# Patient Record
Sex: Male | Born: 1935 | Race: White | Hispanic: No | Marital: Married | Smoking: Former smoker
Health system: Southern US, Community
[De-identification: ages and names within clinical notes are randomized; demographics above are authoritative.]

## PROBLEM LIST (undated history)

## (undated) ENCOUNTER — Inpatient Hospital Stay: Payer: Self-pay

## (undated) DIAGNOSIS — J45909 Unspecified asthma, uncomplicated: Secondary | ICD-10-CM

## (undated) DIAGNOSIS — F419 Anxiety disorder, unspecified: Secondary | ICD-10-CM

## (undated) DIAGNOSIS — J302 Other seasonal allergic rhinitis: Secondary | ICD-10-CM

## (undated) DIAGNOSIS — I10 Essential (primary) hypertension: Secondary | ICD-10-CM

## (undated) DIAGNOSIS — C61 Malignant neoplasm of prostate: Secondary | ICD-10-CM

## (undated) DIAGNOSIS — I4892 Unspecified atrial flutter: Secondary | ICD-10-CM

## (undated) DIAGNOSIS — A7740 Ehrlichiosis, unspecified: Secondary | ICD-10-CM

## (undated) DIAGNOSIS — K219 Gastro-esophageal reflux disease without esophagitis: Secondary | ICD-10-CM

## (undated) DIAGNOSIS — I480 Paroxysmal atrial fibrillation: Secondary | ICD-10-CM

## (undated) DIAGNOSIS — M3130 Wegener's granulomatosis without renal involvement: Secondary | ICD-10-CM

## (undated) HISTORY — DX: Hyperlipidemia, unspecified: E78.5

## (undated) HISTORY — DX: Other seasonal allergic rhinitis: J30.2

## (undated) HISTORY — DX: Unspecified asthma, uncomplicated: J45.909

## (undated) HISTORY — DX: Unspecified atrial flutter: I48.92

## (undated) HISTORY — PX: HERNIA REPAIR: SHX51

## (undated) HISTORY — DX: Paroxysmal atrial fibrillation: I48.0

## (undated) HISTORY — DX: Ehrlichiosis, unspecified: A77.40

## (undated) HISTORY — DX: Essential (primary) hypertension: I10

## (undated) HISTORY — DX: Wegener's granulomatosis without renal involvement: M31.30

## (undated) HISTORY — DX: Gastro-esophageal reflux disease without esophagitis: K21.9

## (undated) HISTORY — DX: Anxiety disorder, unspecified: F41.9

## (undated) HISTORY — DX: Malignant neoplasm of prostate: C61

## (undated) SURGERY — Surgical Case
Anesthesia: *Unknown

---
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
## 1993-02-28 HISTORY — PX: OTHER SURGICAL HISTORY: SHX169

## 1998-02-28 HISTORY — PX: KNEE ARTHROSCOPY: SUR90

## 2006-02-28 DIAGNOSIS — A7740 Ehrlichiosis, unspecified: Secondary | ICD-10-CM

## 2006-02-28 HISTORY — DX: Ehrlichiosis, unspecified: A77.40

## 2011-03-01 HISTORY — PX: BASAL CELL CARCINOMA EXCISION: SHX1214

## 2015-03-03 DIAGNOSIS — J3089 Other allergic rhinitis: Secondary | ICD-10-CM | POA: Diagnosis not present

## 2015-03-03 DIAGNOSIS — J301 Allergic rhinitis due to pollen: Secondary | ICD-10-CM | POA: Diagnosis not present

## 2015-03-24 DIAGNOSIS — R05 Cough: Secondary | ICD-10-CM | POA: Diagnosis not present

## 2015-03-24 DIAGNOSIS — R0602 Shortness of breath: Secondary | ICD-10-CM | POA: Diagnosis not present

## 2015-03-24 DIAGNOSIS — J4541 Moderate persistent asthma with (acute) exacerbation: Secondary | ICD-10-CM | POA: Diagnosis not present

## 2015-03-31 DIAGNOSIS — J3089 Other allergic rhinitis: Secondary | ICD-10-CM | POA: Diagnosis not present

## 2015-03-31 DIAGNOSIS — J45998 Other asthma: Secondary | ICD-10-CM | POA: Diagnosis not present

## 2015-03-31 DIAGNOSIS — J301 Allergic rhinitis due to pollen: Secondary | ICD-10-CM | POA: Diagnosis not present

## 2015-04-06 DIAGNOSIS — J3089 Other allergic rhinitis: Secondary | ICD-10-CM | POA: Diagnosis not present

## 2015-04-06 DIAGNOSIS — J4540 Moderate persistent asthma, uncomplicated: Secondary | ICD-10-CM | POA: Diagnosis not present

## 2015-04-06 DIAGNOSIS — R768 Other specified abnormal immunological findings in serum: Secondary | ICD-10-CM | POA: Diagnosis not present

## 2015-04-07 DIAGNOSIS — J301 Allergic rhinitis due to pollen: Secondary | ICD-10-CM | POA: Diagnosis not present

## 2015-04-07 DIAGNOSIS — J3089 Other allergic rhinitis: Secondary | ICD-10-CM | POA: Diagnosis not present

## 2015-04-16 DIAGNOSIS — J4540 Moderate persistent asthma, uncomplicated: Secondary | ICD-10-CM | POA: Diagnosis not present

## 2015-04-16 DIAGNOSIS — J4550 Severe persistent asthma, uncomplicated: Secondary | ICD-10-CM | POA: Diagnosis not present

## 2015-04-22 DIAGNOSIS — L570 Actinic keratosis: Secondary | ICD-10-CM | POA: Diagnosis not present

## 2015-04-22 DIAGNOSIS — I781 Nevus, non-neoplastic: Secondary | ICD-10-CM | POA: Diagnosis not present

## 2015-04-22 DIAGNOSIS — D2339 Other benign neoplasm of skin of other parts of face: Secondary | ICD-10-CM | POA: Diagnosis not present

## 2015-04-22 DIAGNOSIS — D485 Neoplasm of uncertain behavior of skin: Secondary | ICD-10-CM | POA: Diagnosis not present

## 2015-04-24 DIAGNOSIS — C4491 Basal cell carcinoma of skin, unspecified: Secondary | ICD-10-CM | POA: Diagnosis not present

## 2015-04-24 DIAGNOSIS — C44319 Basal cell carcinoma of skin of other parts of face: Secondary | ICD-10-CM | POA: Diagnosis not present

## 2015-04-24 DIAGNOSIS — D239 Other benign neoplasm of skin, unspecified: Secondary | ICD-10-CM | POA: Diagnosis not present

## 2015-04-24 DIAGNOSIS — D485 Neoplasm of uncertain behavior of skin: Secondary | ICD-10-CM | POA: Diagnosis not present

## 2015-04-30 DIAGNOSIS — C61 Malignant neoplasm of prostate: Secondary | ICD-10-CM | POA: Diagnosis not present

## 2015-04-30 DIAGNOSIS — J301 Allergic rhinitis due to pollen: Secondary | ICD-10-CM | POA: Diagnosis not present

## 2015-04-30 DIAGNOSIS — J3089 Other allergic rhinitis: Secondary | ICD-10-CM | POA: Diagnosis not present

## 2015-05-07 DIAGNOSIS — C61 Malignant neoplasm of prostate: Secondary | ICD-10-CM | POA: Diagnosis not present

## 2015-05-21 DIAGNOSIS — J301 Allergic rhinitis due to pollen: Secondary | ICD-10-CM | POA: Diagnosis not present

## 2015-05-21 DIAGNOSIS — J3089 Other allergic rhinitis: Secondary | ICD-10-CM | POA: Diagnosis not present

## 2015-06-11 DIAGNOSIS — J3089 Other allergic rhinitis: Secondary | ICD-10-CM | POA: Diagnosis not present

## 2015-06-11 DIAGNOSIS — J301 Allergic rhinitis due to pollen: Secondary | ICD-10-CM | POA: Diagnosis not present

## 2015-06-22 DIAGNOSIS — J4540 Moderate persistent asthma, uncomplicated: Secondary | ICD-10-CM | POA: Diagnosis not present

## 2015-06-22 DIAGNOSIS — J3089 Other allergic rhinitis: Secondary | ICD-10-CM | POA: Diagnosis not present

## 2015-06-22 DIAGNOSIS — R768 Other specified abnormal immunological findings in serum: Secondary | ICD-10-CM | POA: Diagnosis not present

## 2015-06-29 DIAGNOSIS — J4530 Mild persistent asthma, uncomplicated: Secondary | ICD-10-CM | POA: Diagnosis not present

## 2015-06-29 DIAGNOSIS — K210 Gastro-esophageal reflux disease with esophagitis: Secondary | ICD-10-CM | POA: Diagnosis not present

## 2015-06-29 DIAGNOSIS — J302 Other seasonal allergic rhinitis: Secondary | ICD-10-CM | POA: Diagnosis not present

## 2015-07-07 DIAGNOSIS — J3089 Other allergic rhinitis: Secondary | ICD-10-CM | POA: Diagnosis not present

## 2015-07-07 DIAGNOSIS — J301 Allergic rhinitis due to pollen: Secondary | ICD-10-CM | POA: Diagnosis not present

## 2015-07-09 DIAGNOSIS — D472 Monoclonal gammopathy: Secondary | ICD-10-CM | POA: Diagnosis not present

## 2015-07-16 DIAGNOSIS — D472 Monoclonal gammopathy: Secondary | ICD-10-CM | POA: Diagnosis not present

## 2015-07-16 DIAGNOSIS — J3089 Other allergic rhinitis: Secondary | ICD-10-CM | POA: Diagnosis not present

## 2015-07-16 DIAGNOSIS — J329 Chronic sinusitis, unspecified: Secondary | ICD-10-CM | POA: Diagnosis not present

## 2015-07-16 DIAGNOSIS — J301 Allergic rhinitis due to pollen: Secondary | ICD-10-CM | POA: Diagnosis not present

## 2015-07-16 DIAGNOSIS — J45998 Other asthma: Secondary | ICD-10-CM | POA: Diagnosis not present

## 2015-07-28 DIAGNOSIS — J3089 Other allergic rhinitis: Secondary | ICD-10-CM | POA: Diagnosis not present

## 2015-07-28 DIAGNOSIS — J301 Allergic rhinitis due to pollen: Secondary | ICD-10-CM | POA: Diagnosis not present

## 2015-08-03 DIAGNOSIS — J4540 Moderate persistent asthma, uncomplicated: Secondary | ICD-10-CM | POA: Diagnosis not present

## 2015-08-18 DIAGNOSIS — J301 Allergic rhinitis due to pollen: Secondary | ICD-10-CM | POA: Diagnosis not present

## 2015-08-18 DIAGNOSIS — J3089 Other allergic rhinitis: Secondary | ICD-10-CM | POA: Diagnosis not present

## 2015-08-24 DIAGNOSIS — J4540 Moderate persistent asthma, uncomplicated: Secondary | ICD-10-CM | POA: Diagnosis not present

## 2015-08-24 DIAGNOSIS — R768 Other specified abnormal immunological findings in serum: Secondary | ICD-10-CM | POA: Diagnosis not present

## 2015-08-24 DIAGNOSIS — J3089 Other allergic rhinitis: Secondary | ICD-10-CM | POA: Diagnosis not present

## 2015-09-08 DIAGNOSIS — J3089 Other allergic rhinitis: Secondary | ICD-10-CM | POA: Diagnosis not present

## 2015-09-08 DIAGNOSIS — J301 Allergic rhinitis due to pollen: Secondary | ICD-10-CM | POA: Diagnosis not present

## 2015-09-14 DIAGNOSIS — L723 Sebaceous cyst: Secondary | ICD-10-CM | POA: Diagnosis not present

## 2015-09-14 DIAGNOSIS — L570 Actinic keratosis: Secondary | ICD-10-CM | POA: Diagnosis not present

## 2015-09-14 DIAGNOSIS — I781 Nevus, non-neoplastic: Secondary | ICD-10-CM | POA: Diagnosis not present

## 2015-09-14 DIAGNOSIS — D2339 Other benign neoplasm of skin of other parts of face: Secondary | ICD-10-CM | POA: Diagnosis not present

## 2015-10-06 DIAGNOSIS — J3089 Other allergic rhinitis: Secondary | ICD-10-CM | POA: Diagnosis not present

## 2015-10-06 DIAGNOSIS — J301 Allergic rhinitis due to pollen: Secondary | ICD-10-CM | POA: Diagnosis not present

## 2015-10-28 DIAGNOSIS — L905 Scar conditions and fibrosis of skin: Secondary | ICD-10-CM | POA: Diagnosis not present

## 2015-10-28 DIAGNOSIS — I781 Nevus, non-neoplastic: Secondary | ICD-10-CM | POA: Diagnosis not present

## 2015-10-28 DIAGNOSIS — L821 Other seborrheic keratosis: Secondary | ICD-10-CM | POA: Diagnosis not present

## 2015-10-28 DIAGNOSIS — L570 Actinic keratosis: Secondary | ICD-10-CM | POA: Diagnosis not present

## 2015-10-29 DIAGNOSIS — J301 Allergic rhinitis due to pollen: Secondary | ICD-10-CM | POA: Diagnosis not present

## 2015-10-29 DIAGNOSIS — J3089 Other allergic rhinitis: Secondary | ICD-10-CM | POA: Diagnosis not present

## 2015-11-24 DIAGNOSIS — J301 Allergic rhinitis due to pollen: Secondary | ICD-10-CM | POA: Diagnosis not present

## 2015-11-24 DIAGNOSIS — J3089 Other allergic rhinitis: Secondary | ICD-10-CM | POA: Diagnosis not present

## 2015-11-25 DIAGNOSIS — J3089 Other allergic rhinitis: Secondary | ICD-10-CM | POA: Diagnosis not present

## 2015-11-25 DIAGNOSIS — J4540 Moderate persistent asthma, uncomplicated: Secondary | ICD-10-CM | POA: Diagnosis not present

## 2015-11-25 DIAGNOSIS — B4481 Allergic bronchopulmonary aspergillosis: Secondary | ICD-10-CM | POA: Diagnosis not present

## 2015-12-15 DIAGNOSIS — J301 Allergic rhinitis due to pollen: Secondary | ICD-10-CM | POA: Diagnosis not present

## 2015-12-15 DIAGNOSIS — J3089 Other allergic rhinitis: Secondary | ICD-10-CM | POA: Diagnosis not present

## 2015-12-17 DIAGNOSIS — Z09 Encounter for follow-up examination after completed treatment for conditions other than malignant neoplasm: Secondary | ICD-10-CM | POA: Diagnosis not present

## 2015-12-17 DIAGNOSIS — B4481 Allergic bronchopulmonary aspergillosis: Secondary | ICD-10-CM | POA: Diagnosis not present

## 2015-12-21 DIAGNOSIS — H35373 Puckering of macula, bilateral: Secondary | ICD-10-CM | POA: Diagnosis not present

## 2015-12-21 DIAGNOSIS — H43813 Vitreous degeneration, bilateral: Secondary | ICD-10-CM | POA: Diagnosis not present

## 2015-12-21 DIAGNOSIS — H2513 Age-related nuclear cataract, bilateral: Secondary | ICD-10-CM | POA: Diagnosis not present

## 2015-12-25 DIAGNOSIS — D485 Neoplasm of uncertain behavior of skin: Secondary | ICD-10-CM | POA: Diagnosis not present

## 2015-12-25 DIAGNOSIS — L905 Scar conditions and fibrosis of skin: Secondary | ICD-10-CM | POA: Diagnosis not present

## 2015-12-28 DIAGNOSIS — Z833 Family history of diabetes mellitus: Secondary | ICD-10-CM | POA: Diagnosis not present

## 2015-12-28 DIAGNOSIS — M7551 Bursitis of right shoulder: Secondary | ICD-10-CM | POA: Diagnosis not present

## 2015-12-28 DIAGNOSIS — Z0000 Encounter for general adult medical examination without abnormal findings: Secondary | ICD-10-CM | POA: Diagnosis not present

## 2015-12-28 DIAGNOSIS — J4540 Moderate persistent asthma, uncomplicated: Secondary | ICD-10-CM | POA: Diagnosis not present

## 2015-12-28 DIAGNOSIS — Z92241 Personal history of systemic steroid therapy: Secondary | ICD-10-CM | POA: Diagnosis not present

## 2015-12-28 DIAGNOSIS — Z23 Encounter for immunization: Secondary | ICD-10-CM | POA: Diagnosis not present

## 2016-01-05 DIAGNOSIS — J3089 Other allergic rhinitis: Secondary | ICD-10-CM | POA: Diagnosis not present

## 2016-01-05 DIAGNOSIS — J301 Allergic rhinitis due to pollen: Secondary | ICD-10-CM | POA: Diagnosis not present

## 2016-01-28 DIAGNOSIS — J3089 Other allergic rhinitis: Secondary | ICD-10-CM | POA: Diagnosis not present

## 2016-01-28 DIAGNOSIS — J301 Allergic rhinitis due to pollen: Secondary | ICD-10-CM | POA: Diagnosis not present

## 2016-02-01 DIAGNOSIS — C44319 Basal cell carcinoma of skin of other parts of face: Secondary | ICD-10-CM | POA: Diagnosis not present

## 2016-02-01 DIAGNOSIS — Z85828 Personal history of other malignant neoplasm of skin: Secondary | ICD-10-CM | POA: Diagnosis not present

## 2016-02-01 DIAGNOSIS — D485 Neoplasm of uncertain behavior of skin: Secondary | ICD-10-CM | POA: Diagnosis not present

## 2016-02-02 DIAGNOSIS — Z92241 Personal history of systemic steroid therapy: Secondary | ICD-10-CM | POA: Diagnosis not present

## 2016-02-02 DIAGNOSIS — Z833 Family history of diabetes mellitus: Secondary | ICD-10-CM | POA: Diagnosis not present

## 2016-02-02 DIAGNOSIS — Z0000 Encounter for general adult medical examination without abnormal findings: Secondary | ICD-10-CM | POA: Diagnosis not present

## 2016-02-10 DIAGNOSIS — M7061 Trochanteric bursitis, right hip: Secondary | ICD-10-CM | POA: Diagnosis not present

## 2016-02-11 DIAGNOSIS — Z85828 Personal history of other malignant neoplasm of skin: Secondary | ICD-10-CM | POA: Diagnosis not present

## 2016-02-11 DIAGNOSIS — L905 Scar conditions and fibrosis of skin: Secondary | ICD-10-CM | POA: Diagnosis not present

## 2016-02-11 DIAGNOSIS — L570 Actinic keratosis: Secondary | ICD-10-CM | POA: Diagnosis not present

## 2016-02-15 DIAGNOSIS — B4481 Allergic bronchopulmonary aspergillosis: Secondary | ICD-10-CM | POA: Diagnosis not present

## 2016-02-15 DIAGNOSIS — J4540 Moderate persistent asthma, uncomplicated: Secondary | ICD-10-CM | POA: Diagnosis not present

## 2016-02-18 DIAGNOSIS — J301 Allergic rhinitis due to pollen: Secondary | ICD-10-CM | POA: Diagnosis not present

## 2016-02-18 DIAGNOSIS — J3089 Other allergic rhinitis: Secondary | ICD-10-CM | POA: Diagnosis not present

## 2016-03-11 DIAGNOSIS — M25511 Pain in right shoulder: Secondary | ICD-10-CM | POA: Diagnosis not present

## 2016-03-15 DIAGNOSIS — J301 Allergic rhinitis due to pollen: Secondary | ICD-10-CM | POA: Diagnosis not present

## 2016-03-15 DIAGNOSIS — J3089 Other allergic rhinitis: Secondary | ICD-10-CM | POA: Diagnosis not present

## 2016-03-16 DIAGNOSIS — M25511 Pain in right shoulder: Secondary | ICD-10-CM | POA: Diagnosis not present

## 2016-03-16 DIAGNOSIS — M7541 Impingement syndrome of right shoulder: Secondary | ICD-10-CM | POA: Diagnosis not present

## 2016-03-16 DIAGNOSIS — M19011 Primary osteoarthritis, right shoulder: Secondary | ICD-10-CM | POA: Diagnosis not present

## 2016-03-18 DIAGNOSIS — M7541 Impingement syndrome of right shoulder: Secondary | ICD-10-CM | POA: Diagnosis not present

## 2016-03-18 DIAGNOSIS — M25511 Pain in right shoulder: Secondary | ICD-10-CM | POA: Diagnosis not present

## 2016-03-18 DIAGNOSIS — M19011 Primary osteoarthritis, right shoulder: Secondary | ICD-10-CM | POA: Diagnosis not present

## 2016-03-21 DIAGNOSIS — M25511 Pain in right shoulder: Secondary | ICD-10-CM | POA: Diagnosis not present

## 2016-03-21 DIAGNOSIS — M19011 Primary osteoarthritis, right shoulder: Secondary | ICD-10-CM | POA: Diagnosis not present

## 2016-03-21 DIAGNOSIS — M7541 Impingement syndrome of right shoulder: Secondary | ICD-10-CM | POA: Diagnosis not present

## 2016-03-23 DIAGNOSIS — M19011 Primary osteoarthritis, right shoulder: Secondary | ICD-10-CM | POA: Diagnosis not present

## 2016-03-23 DIAGNOSIS — M25511 Pain in right shoulder: Secondary | ICD-10-CM | POA: Diagnosis not present

## 2016-03-23 DIAGNOSIS — M7541 Impingement syndrome of right shoulder: Secondary | ICD-10-CM | POA: Diagnosis not present

## 2016-03-25 DIAGNOSIS — M19011 Primary osteoarthritis, right shoulder: Secondary | ICD-10-CM | POA: Diagnosis not present

## 2016-03-25 DIAGNOSIS — M25511 Pain in right shoulder: Secondary | ICD-10-CM | POA: Diagnosis not present

## 2016-03-25 DIAGNOSIS — M7541 Impingement syndrome of right shoulder: Secondary | ICD-10-CM | POA: Diagnosis not present

## 2016-03-30 DIAGNOSIS — M25511 Pain in right shoulder: Secondary | ICD-10-CM | POA: Diagnosis not present

## 2016-03-30 DIAGNOSIS — M7541 Impingement syndrome of right shoulder: Secondary | ICD-10-CM | POA: Diagnosis not present

## 2016-03-30 DIAGNOSIS — M19011 Primary osteoarthritis, right shoulder: Secondary | ICD-10-CM | POA: Diagnosis not present

## 2016-04-01 DIAGNOSIS — M7541 Impingement syndrome of right shoulder: Secondary | ICD-10-CM | POA: Diagnosis not present

## 2016-04-01 DIAGNOSIS — M25511 Pain in right shoulder: Secondary | ICD-10-CM | POA: Diagnosis not present

## 2016-04-01 DIAGNOSIS — M19011 Primary osteoarthritis, right shoulder: Secondary | ICD-10-CM | POA: Diagnosis not present

## 2016-04-04 DIAGNOSIS — M19011 Primary osteoarthritis, right shoulder: Secondary | ICD-10-CM | POA: Diagnosis not present

## 2016-04-04 DIAGNOSIS — M7541 Impingement syndrome of right shoulder: Secondary | ICD-10-CM | POA: Diagnosis not present

## 2016-04-04 DIAGNOSIS — M25511 Pain in right shoulder: Secondary | ICD-10-CM | POA: Diagnosis not present

## 2016-04-05 DIAGNOSIS — J3089 Other allergic rhinitis: Secondary | ICD-10-CM | POA: Diagnosis not present

## 2016-04-05 DIAGNOSIS — J301 Allergic rhinitis due to pollen: Secondary | ICD-10-CM | POA: Diagnosis not present

## 2016-04-06 DIAGNOSIS — M7541 Impingement syndrome of right shoulder: Secondary | ICD-10-CM | POA: Diagnosis not present

## 2016-04-06 DIAGNOSIS — M19011 Primary osteoarthritis, right shoulder: Secondary | ICD-10-CM | POA: Diagnosis not present

## 2016-04-06 DIAGNOSIS — M25511 Pain in right shoulder: Secondary | ICD-10-CM | POA: Diagnosis not present

## 2016-04-08 DIAGNOSIS — M19011 Primary osteoarthritis, right shoulder: Secondary | ICD-10-CM | POA: Diagnosis not present

## 2016-04-08 DIAGNOSIS — M25511 Pain in right shoulder: Secondary | ICD-10-CM | POA: Diagnosis not present

## 2016-04-08 DIAGNOSIS — M7541 Impingement syndrome of right shoulder: Secondary | ICD-10-CM | POA: Diagnosis not present

## 2016-04-11 DIAGNOSIS — M25511 Pain in right shoulder: Secondary | ICD-10-CM | POA: Diagnosis not present

## 2016-04-11 DIAGNOSIS — M7541 Impingement syndrome of right shoulder: Secondary | ICD-10-CM | POA: Diagnosis not present

## 2016-04-11 DIAGNOSIS — M19011 Primary osteoarthritis, right shoulder: Secondary | ICD-10-CM | POA: Diagnosis not present

## 2016-04-13 DIAGNOSIS — M7541 Impingement syndrome of right shoulder: Secondary | ICD-10-CM | POA: Diagnosis not present

## 2016-04-13 DIAGNOSIS — M25511 Pain in right shoulder: Secondary | ICD-10-CM | POA: Diagnosis not present

## 2016-04-13 DIAGNOSIS — M19011 Primary osteoarthritis, right shoulder: Secondary | ICD-10-CM | POA: Diagnosis not present

## 2016-04-20 DIAGNOSIS — M25511 Pain in right shoulder: Secondary | ICD-10-CM | POA: Diagnosis not present

## 2016-04-20 DIAGNOSIS — M19011 Primary osteoarthritis, right shoulder: Secondary | ICD-10-CM | POA: Diagnosis not present

## 2016-04-20 DIAGNOSIS — M7541 Impingement syndrome of right shoulder: Secondary | ICD-10-CM | POA: Diagnosis not present

## 2016-04-22 DIAGNOSIS — M25511 Pain in right shoulder: Secondary | ICD-10-CM | POA: Diagnosis not present

## 2016-04-22 DIAGNOSIS — M19011 Primary osteoarthritis, right shoulder: Secondary | ICD-10-CM | POA: Diagnosis not present

## 2016-04-22 DIAGNOSIS — M7541 Impingement syndrome of right shoulder: Secondary | ICD-10-CM | POA: Diagnosis not present

## 2016-04-25 DIAGNOSIS — M7541 Impingement syndrome of right shoulder: Secondary | ICD-10-CM | POA: Diagnosis not present

## 2016-04-25 DIAGNOSIS — M25511 Pain in right shoulder: Secondary | ICD-10-CM | POA: Diagnosis not present

## 2016-04-25 DIAGNOSIS — M19011 Primary osteoarthritis, right shoulder: Secondary | ICD-10-CM | POA: Diagnosis not present

## 2016-04-27 DIAGNOSIS — M7541 Impingement syndrome of right shoulder: Secondary | ICD-10-CM | POA: Diagnosis not present

## 2016-04-27 DIAGNOSIS — M25511 Pain in right shoulder: Secondary | ICD-10-CM | POA: Diagnosis not present

## 2016-04-27 DIAGNOSIS — M19011 Primary osteoarthritis, right shoulder: Secondary | ICD-10-CM | POA: Diagnosis not present

## 2016-04-28 DIAGNOSIS — J301 Allergic rhinitis due to pollen: Secondary | ICD-10-CM | POA: Diagnosis not present

## 2016-04-28 DIAGNOSIS — J3089 Other allergic rhinitis: Secondary | ICD-10-CM | POA: Diagnosis not present

## 2016-04-28 DIAGNOSIS — C61 Malignant neoplasm of prostate: Secondary | ICD-10-CM | POA: Diagnosis not present

## 2016-04-29 DIAGNOSIS — M7541 Impingement syndrome of right shoulder: Secondary | ICD-10-CM | POA: Diagnosis not present

## 2016-04-29 DIAGNOSIS — M25511 Pain in right shoulder: Secondary | ICD-10-CM | POA: Diagnosis not present

## 2016-04-29 DIAGNOSIS — M19011 Primary osteoarthritis, right shoulder: Secondary | ICD-10-CM | POA: Diagnosis not present

## 2016-05-02 DIAGNOSIS — M19011 Primary osteoarthritis, right shoulder: Secondary | ICD-10-CM | POA: Diagnosis not present

## 2016-05-02 DIAGNOSIS — M7541 Impingement syndrome of right shoulder: Secondary | ICD-10-CM | POA: Diagnosis not present

## 2016-05-02 DIAGNOSIS — M25511 Pain in right shoulder: Secondary | ICD-10-CM | POA: Diagnosis not present

## 2016-05-04 DIAGNOSIS — M7541 Impingement syndrome of right shoulder: Secondary | ICD-10-CM | POA: Diagnosis not present

## 2016-05-04 DIAGNOSIS — M19011 Primary osteoarthritis, right shoulder: Secondary | ICD-10-CM | POA: Diagnosis not present

## 2016-05-04 DIAGNOSIS — M25511 Pain in right shoulder: Secondary | ICD-10-CM | POA: Diagnosis not present

## 2016-05-05 DIAGNOSIS — C61 Malignant neoplasm of prostate: Secondary | ICD-10-CM | POA: Diagnosis not present

## 2016-05-09 DIAGNOSIS — M19011 Primary osteoarthritis, right shoulder: Secondary | ICD-10-CM | POA: Diagnosis not present

## 2016-05-09 DIAGNOSIS — M7541 Impingement syndrome of right shoulder: Secondary | ICD-10-CM | POA: Diagnosis not present

## 2016-05-09 DIAGNOSIS — M25511 Pain in right shoulder: Secondary | ICD-10-CM | POA: Diagnosis not present

## 2016-05-11 DIAGNOSIS — M19011 Primary osteoarthritis, right shoulder: Secondary | ICD-10-CM | POA: Diagnosis not present

## 2016-05-11 DIAGNOSIS — M7541 Impingement syndrome of right shoulder: Secondary | ICD-10-CM | POA: Diagnosis not present

## 2016-05-11 DIAGNOSIS — M25511 Pain in right shoulder: Secondary | ICD-10-CM | POA: Diagnosis not present

## 2016-05-17 DIAGNOSIS — J4540 Moderate persistent asthma, uncomplicated: Secondary | ICD-10-CM | POA: Diagnosis not present

## 2016-05-17 DIAGNOSIS — J302 Other seasonal allergic rhinitis: Secondary | ICD-10-CM | POA: Diagnosis not present

## 2016-05-17 DIAGNOSIS — M7541 Impingement syndrome of right shoulder: Secondary | ICD-10-CM | POA: Diagnosis not present

## 2016-05-17 DIAGNOSIS — M19011 Primary osteoarthritis, right shoulder: Secondary | ICD-10-CM | POA: Diagnosis not present

## 2016-05-17 DIAGNOSIS — M25511 Pain in right shoulder: Secondary | ICD-10-CM | POA: Diagnosis not present

## 2016-05-17 DIAGNOSIS — B4481 Allergic bronchopulmonary aspergillosis: Secondary | ICD-10-CM | POA: Diagnosis not present

## 2016-05-19 DIAGNOSIS — L905 Scar conditions and fibrosis of skin: Secondary | ICD-10-CM | POA: Diagnosis not present

## 2016-05-19 DIAGNOSIS — L570 Actinic keratosis: Secondary | ICD-10-CM | POA: Diagnosis not present

## 2016-05-19 DIAGNOSIS — Z85828 Personal history of other malignant neoplasm of skin: Secondary | ICD-10-CM | POA: Diagnosis not present

## 2016-05-19 DIAGNOSIS — D485 Neoplasm of uncertain behavior of skin: Secondary | ICD-10-CM | POA: Diagnosis not present

## 2016-05-20 DIAGNOSIS — M19011 Primary osteoarthritis, right shoulder: Secondary | ICD-10-CM | POA: Diagnosis not present

## 2016-05-20 DIAGNOSIS — M25511 Pain in right shoulder: Secondary | ICD-10-CM | POA: Diagnosis not present

## 2016-05-20 DIAGNOSIS — M7541 Impingement syndrome of right shoulder: Secondary | ICD-10-CM | POA: Diagnosis not present

## 2016-05-23 DIAGNOSIS — M25511 Pain in right shoulder: Secondary | ICD-10-CM | POA: Diagnosis not present

## 2016-05-23 DIAGNOSIS — M7541 Impingement syndrome of right shoulder: Secondary | ICD-10-CM | POA: Diagnosis not present

## 2016-05-23 DIAGNOSIS — M19011 Primary osteoarthritis, right shoulder: Secondary | ICD-10-CM | POA: Diagnosis not present

## 2016-05-24 DIAGNOSIS — E039 Hypothyroidism, unspecified: Secondary | ICD-10-CM | POA: Diagnosis not present

## 2016-05-24 DIAGNOSIS — J301 Allergic rhinitis due to pollen: Secondary | ICD-10-CM | POA: Diagnosis not present

## 2016-05-24 DIAGNOSIS — J3089 Other allergic rhinitis: Secondary | ICD-10-CM | POA: Diagnosis not present

## 2016-05-25 DIAGNOSIS — M7541 Impingement syndrome of right shoulder: Secondary | ICD-10-CM | POA: Diagnosis not present

## 2016-05-25 DIAGNOSIS — M19011 Primary osteoarthritis, right shoulder: Secondary | ICD-10-CM | POA: Diagnosis not present

## 2016-05-25 DIAGNOSIS — M25511 Pain in right shoulder: Secondary | ICD-10-CM | POA: Diagnosis not present

## 2016-05-27 DIAGNOSIS — M7541 Impingement syndrome of right shoulder: Secondary | ICD-10-CM | POA: Diagnosis not present

## 2016-05-27 DIAGNOSIS — M19011 Primary osteoarthritis, right shoulder: Secondary | ICD-10-CM | POA: Diagnosis not present

## 2016-05-27 DIAGNOSIS — M25511 Pain in right shoulder: Secondary | ICD-10-CM | POA: Diagnosis not present

## 2016-05-30 DIAGNOSIS — M19011 Primary osteoarthritis, right shoulder: Secondary | ICD-10-CM | POA: Diagnosis not present

## 2016-05-30 DIAGNOSIS — M25511 Pain in right shoulder: Secondary | ICD-10-CM | POA: Diagnosis not present

## 2016-05-30 DIAGNOSIS — M7541 Impingement syndrome of right shoulder: Secondary | ICD-10-CM | POA: Diagnosis not present

## 2016-06-01 DIAGNOSIS — M25511 Pain in right shoulder: Secondary | ICD-10-CM | POA: Diagnosis not present

## 2016-06-01 DIAGNOSIS — M19011 Primary osteoarthritis, right shoulder: Secondary | ICD-10-CM | POA: Diagnosis not present

## 2016-06-01 DIAGNOSIS — M7541 Impingement syndrome of right shoulder: Secondary | ICD-10-CM | POA: Diagnosis not present

## 2016-06-03 DIAGNOSIS — M19011 Primary osteoarthritis, right shoulder: Secondary | ICD-10-CM | POA: Diagnosis not present

## 2016-06-03 DIAGNOSIS — M7541 Impingement syndrome of right shoulder: Secondary | ICD-10-CM | POA: Diagnosis not present

## 2016-06-03 DIAGNOSIS — M25511 Pain in right shoulder: Secondary | ICD-10-CM | POA: Diagnosis not present

## 2016-06-06 DIAGNOSIS — M7541 Impingement syndrome of right shoulder: Secondary | ICD-10-CM | POA: Diagnosis not present

## 2016-06-06 DIAGNOSIS — M19011 Primary osteoarthritis, right shoulder: Secondary | ICD-10-CM | POA: Diagnosis not present

## 2016-06-06 DIAGNOSIS — M25511 Pain in right shoulder: Secondary | ICD-10-CM | POA: Diagnosis not present

## 2016-06-08 DIAGNOSIS — M25511 Pain in right shoulder: Secondary | ICD-10-CM | POA: Diagnosis not present

## 2016-06-08 DIAGNOSIS — M7541 Impingement syndrome of right shoulder: Secondary | ICD-10-CM | POA: Diagnosis not present

## 2016-06-08 DIAGNOSIS — M19011 Primary osteoarthritis, right shoulder: Secondary | ICD-10-CM | POA: Diagnosis not present

## 2016-06-14 DIAGNOSIS — J301 Allergic rhinitis due to pollen: Secondary | ICD-10-CM | POA: Diagnosis not present

## 2016-06-14 DIAGNOSIS — J3089 Other allergic rhinitis: Secondary | ICD-10-CM | POA: Diagnosis not present

## 2016-06-23 DIAGNOSIS — L570 Actinic keratosis: Secondary | ICD-10-CM | POA: Diagnosis not present

## 2016-06-23 DIAGNOSIS — Z85828 Personal history of other malignant neoplasm of skin: Secondary | ICD-10-CM | POA: Diagnosis not present

## 2016-07-07 DIAGNOSIS — D472 Monoclonal gammopathy: Secondary | ICD-10-CM | POA: Diagnosis not present

## 2016-07-07 DIAGNOSIS — S61512A Laceration without foreign body of left wrist, initial encounter: Secondary | ICD-10-CM | POA: Diagnosis not present

## 2016-07-07 DIAGNOSIS — Y280XXA Contact with sharp glass, undetermined intent, initial encounter: Secondary | ICD-10-CM | POA: Diagnosis not present

## 2016-07-14 DIAGNOSIS — J301 Allergic rhinitis due to pollen: Secondary | ICD-10-CM | POA: Diagnosis not present

## 2016-07-14 DIAGNOSIS — J3089 Other allergic rhinitis: Secondary | ICD-10-CM | POA: Diagnosis not present

## 2016-07-15 DIAGNOSIS — D472 Monoclonal gammopathy: Secondary | ICD-10-CM | POA: Diagnosis not present

## 2016-07-27 DIAGNOSIS — L570 Actinic keratosis: Secondary | ICD-10-CM | POA: Diagnosis not present

## 2016-08-03 DIAGNOSIS — J4540 Moderate persistent asthma, uncomplicated: Secondary | ICD-10-CM | POA: Diagnosis not present

## 2016-08-03 DIAGNOSIS — R768 Other specified abnormal immunological findings in serum: Secondary | ICD-10-CM | POA: Diagnosis not present

## 2016-08-03 DIAGNOSIS — J302 Other seasonal allergic rhinitis: Secondary | ICD-10-CM | POA: Diagnosis not present

## 2016-08-04 DIAGNOSIS — J45998 Other asthma: Secondary | ICD-10-CM | POA: Diagnosis not present

## 2016-08-04 DIAGNOSIS — J329 Chronic sinusitis, unspecified: Secondary | ICD-10-CM | POA: Diagnosis not present

## 2016-08-04 DIAGNOSIS — J3089 Other allergic rhinitis: Secondary | ICD-10-CM | POA: Diagnosis not present

## 2016-08-04 DIAGNOSIS — J301 Allergic rhinitis due to pollen: Secondary | ICD-10-CM | POA: Diagnosis not present

## 2016-08-08 DIAGNOSIS — D472 Monoclonal gammopathy: Secondary | ICD-10-CM | POA: Diagnosis not present

## 2016-08-08 DIAGNOSIS — J302 Other seasonal allergic rhinitis: Secondary | ICD-10-CM | POA: Diagnosis not present

## 2016-08-08 DIAGNOSIS — F419 Anxiety disorder, unspecified: Secondary | ICD-10-CM | POA: Diagnosis not present

## 2016-08-08 DIAGNOSIS — E7801 Familial hypercholesterolemia: Secondary | ICD-10-CM | POA: Diagnosis not present

## 2016-08-08 DIAGNOSIS — K210 Gastro-esophageal reflux disease with esophagitis: Secondary | ICD-10-CM | POA: Diagnosis not present

## 2016-08-08 DIAGNOSIS — J4530 Mild persistent asthma, uncomplicated: Secondary | ICD-10-CM | POA: Diagnosis not present

## 2016-08-12 DIAGNOSIS — H01001 Unspecified blepharitis right upper eyelid: Secondary | ICD-10-CM | POA: Diagnosis not present

## 2016-08-12 DIAGNOSIS — H01004 Unspecified blepharitis left upper eyelid: Secondary | ICD-10-CM | POA: Diagnosis not present

## 2016-08-25 DIAGNOSIS — Z8614 Personal history of Methicillin resistant Staphylococcus aureus infection: Secondary | ICD-10-CM | POA: Diagnosis not present

## 2016-08-25 DIAGNOSIS — I4892 Unspecified atrial flutter: Secondary | ICD-10-CM | POA: Diagnosis not present

## 2016-08-25 DIAGNOSIS — Z85820 Personal history of malignant melanoma of skin: Secondary | ICD-10-CM | POA: Diagnosis not present

## 2016-08-25 DIAGNOSIS — I4891 Unspecified atrial fibrillation: Secondary | ICD-10-CM | POA: Diagnosis not present

## 2016-08-25 DIAGNOSIS — R079 Chest pain, unspecified: Secondary | ICD-10-CM | POA: Diagnosis not present

## 2016-08-25 DIAGNOSIS — K219 Gastro-esophageal reflux disease without esophagitis: Secondary | ICD-10-CM | POA: Diagnosis not present

## 2016-08-25 DIAGNOSIS — Z880 Allergy status to penicillin: Secondary | ICD-10-CM | POA: Diagnosis not present

## 2016-08-25 DIAGNOSIS — Z8701 Personal history of pneumonia (recurrent): Secondary | ICD-10-CM | POA: Diagnosis not present

## 2016-08-29 DIAGNOSIS — I4891 Unspecified atrial fibrillation: Secondary | ICD-10-CM | POA: Diagnosis not present

## 2016-08-29 DIAGNOSIS — I483 Typical atrial flutter: Secondary | ICD-10-CM | POA: Diagnosis not present

## 2016-08-30 DIAGNOSIS — J3089 Other allergic rhinitis: Secondary | ICD-10-CM | POA: Diagnosis not present

## 2016-08-30 DIAGNOSIS — J301 Allergic rhinitis due to pollen: Secondary | ICD-10-CM | POA: Diagnosis not present

## 2016-09-08 DIAGNOSIS — I4891 Unspecified atrial fibrillation: Secondary | ICD-10-CM | POA: Diagnosis not present

## 2016-09-08 DIAGNOSIS — I483 Typical atrial flutter: Secondary | ICD-10-CM | POA: Diagnosis not present

## 2016-09-20 DIAGNOSIS — J301 Allergic rhinitis due to pollen: Secondary | ICD-10-CM | POA: Diagnosis not present

## 2016-09-20 DIAGNOSIS — J3089 Other allergic rhinitis: Secondary | ICD-10-CM | POA: Diagnosis not present

## 2016-09-28 DIAGNOSIS — R768 Other specified abnormal immunological findings in serum: Secondary | ICD-10-CM | POA: Diagnosis not present

## 2016-09-28 DIAGNOSIS — J4540 Moderate persistent asthma, uncomplicated: Secondary | ICD-10-CM | POA: Diagnosis not present

## 2016-09-28 DIAGNOSIS — J302 Other seasonal allergic rhinitis: Secondary | ICD-10-CM | POA: Diagnosis not present

## 2016-10-03 DIAGNOSIS — I10 Essential (primary) hypertension: Secondary | ICD-10-CM | POA: Diagnosis not present

## 2016-10-03 DIAGNOSIS — I4891 Unspecified atrial fibrillation: Secondary | ICD-10-CM | POA: Diagnosis not present

## 2016-10-03 DIAGNOSIS — E7800 Pure hypercholesterolemia, unspecified: Secondary | ICD-10-CM | POA: Diagnosis not present

## 2016-10-03 DIAGNOSIS — I483 Typical atrial flutter: Secondary | ICD-10-CM | POA: Diagnosis not present

## 2016-10-11 DIAGNOSIS — J301 Allergic rhinitis due to pollen: Secondary | ICD-10-CM | POA: Diagnosis not present

## 2016-10-11 DIAGNOSIS — J3089 Other allergic rhinitis: Secondary | ICD-10-CM | POA: Diagnosis not present

## 2016-10-26 DIAGNOSIS — D485 Neoplasm of uncertain behavior of skin: Secondary | ICD-10-CM | POA: Diagnosis not present

## 2016-10-26 DIAGNOSIS — D2339 Other benign neoplasm of skin of other parts of face: Secondary | ICD-10-CM | POA: Diagnosis not present

## 2016-10-26 DIAGNOSIS — D2371 Other benign neoplasm of skin of right lower limb, including hip: Secondary | ICD-10-CM | POA: Diagnosis not present

## 2016-10-26 DIAGNOSIS — D2362 Other benign neoplasm of skin of left upper limb, including shoulder: Secondary | ICD-10-CM | POA: Diagnosis not present

## 2016-10-27 DIAGNOSIS — H00012 Hordeolum externum right lower eyelid: Secondary | ICD-10-CM | POA: Diagnosis not present

## 2016-10-27 DIAGNOSIS — E7801 Familial hypercholesterolemia: Secondary | ICD-10-CM | POA: Diagnosis not present

## 2016-10-27 DIAGNOSIS — J4530 Mild persistent asthma, uncomplicated: Secondary | ICD-10-CM | POA: Diagnosis not present

## 2016-10-27 DIAGNOSIS — I482 Chronic atrial fibrillation: Secondary | ICD-10-CM | POA: Diagnosis not present

## 2016-10-27 DIAGNOSIS — E7800 Pure hypercholesterolemia, unspecified: Secondary | ICD-10-CM | POA: Diagnosis not present

## 2016-11-01 DIAGNOSIS — J3089 Other allergic rhinitis: Secondary | ICD-10-CM | POA: Diagnosis not present

## 2016-11-01 DIAGNOSIS — J301 Allergic rhinitis due to pollen: Secondary | ICD-10-CM | POA: Diagnosis not present

## 2016-12-28 DIAGNOSIS — J4540 Moderate persistent asthma, uncomplicated: Secondary | ICD-10-CM | POA: Diagnosis not present

## 2016-12-28 DIAGNOSIS — Z23 Encounter for immunization: Secondary | ICD-10-CM | POA: Diagnosis not present

## 2016-12-28 DIAGNOSIS — Z6823 Body mass index (BMI) 23.0-23.9, adult: Secondary | ICD-10-CM | POA: Diagnosis not present

## 2016-12-28 DIAGNOSIS — R82998 Other abnormal findings in urine: Secondary | ICD-10-CM | POA: Diagnosis not present

## 2016-12-28 DIAGNOSIS — Z79899 Other long term (current) drug therapy: Secondary | ICD-10-CM | POA: Diagnosis not present

## 2016-12-28 DIAGNOSIS — Z8546 Personal history of malignant neoplasm of prostate: Secondary | ICD-10-CM | POA: Diagnosis not present

## 2016-12-28 DIAGNOSIS — Z7901 Long term (current) use of anticoagulants: Secondary | ICD-10-CM | POA: Diagnosis not present

## 2016-12-28 DIAGNOSIS — J302 Other seasonal allergic rhinitis: Secondary | ICD-10-CM | POA: Diagnosis not present

## 2016-12-28 DIAGNOSIS — Z1389 Encounter for screening for other disorder: Secondary | ICD-10-CM | POA: Diagnosis not present

## 2016-12-28 DIAGNOSIS — E7800 Pure hypercholesterolemia, unspecified: Secondary | ICD-10-CM | POA: Diagnosis not present

## 2016-12-28 DIAGNOSIS — I4892 Unspecified atrial flutter: Secondary | ICD-10-CM | POA: Diagnosis not present

## 2016-12-29 DIAGNOSIS — R946 Abnormal results of thyroid function studies: Secondary | ICD-10-CM | POA: Diagnosis not present

## 2017-01-03 ENCOUNTER — Ambulatory Visit (INDEPENDENT_AMBULATORY_CARE_PROVIDER_SITE_OTHER): Payer: Medicare Other | Admitting: Pulmonary Disease

## 2017-01-03 ENCOUNTER — Encounter: Payer: Self-pay | Admitting: Pulmonary Disease

## 2017-01-03 VITALS — BP 148/70 | HR 59 | Ht 70.0 in | Wt 163.0 lb

## 2017-01-03 DIAGNOSIS — J301 Allergic rhinitis due to pollen: Secondary | ICD-10-CM

## 2017-01-03 DIAGNOSIS — J4540 Moderate persistent asthma, uncomplicated: Secondary | ICD-10-CM | POA: Diagnosis not present

## 2017-01-03 MED ORDER — PREDNISONE 10 MG PO TABS: ORAL_TABLET | ORAL | 0 refills | Status: DC

## 2017-01-03 NOTE — Patient Instructions (Signed)
Asthma: Symbicort 2 puffs twice a day no matter how you feel Use albuterol as needed for chest tightness wheezing or shortness of breath, if you are having to do this more than twice per week for the symptoms I need to know this Give you a prescription for prednisone to use as needed for flareups of your allergic rhinitis or asthma as this has worked for you in the past Call us [place] to prescribe the Symbicort Request records from your prior pulmonologist [place]  Allergic rhinitis: Continue Singulair Keep taking Xyzal as your are doing  We will see you back in 4-6 months or sooner if needed

## 2017-01-03 NOTE — Progress Notes (Signed)
Subjective:    Patient ID: Chad Morrison, male    DOB: 08-25-1932, 83 y.o.   MRN: [id number]  Synopsis: Referred in 2018 for evaluation of asthma.    HPI Chief Complaint  Patient presents with  . Advice Only    Self referral- transferring care d/t recent move for care of asthma.      [name] has a history of asthma and allergies.    His flu and pneumonia vaccines are up to date.   "Gene" and his wife recently moved here from [place] on the [employer].    He tells me that he has asthma: > when he moved around in [place] he had a lot of allergies and ended up having immunotherapy > he had a lot of allergic rhinitis at first but then eventually he had cough and asthma > Even when he lived in different regions of [place] he noted significant differences in the severity of his symptoms > typically the summer is best for him when the air is warmer.   > he does worse when there is a lot of moisture that sticks around (fall, winter > he has recurrent exacerbations of his asthma which requires prednisone which he calls his "miracle drug" > the last time he needed prednisone was in March of 2018 > he typically takes a slow tapering dose starting > he takes albuterol once in the morning for congestion in his throat, not so much due to   He had pneumonia in 2016 that required a hospitalization.   He has not established care with an allergist yet.  He says that since moving here his symptoms are much better than the [place] of [place].  He has a little runny nose, but its not too bad.    He takes singulair, Symbicort, and nasacort regularly.    His last allergy shot was in Chad or early September.    He has atrial flutter, he has not had an ablation procedure yet.  His pulmonologist noticed that he had a flutter so he referred him to the hospital for further evaluation  Past Medical History:  Diagnosis Date  . Asthma   . Atrial flutter ([place])      No family history on  file.   Social History   Socioeconomic History  . Marital status: Married    Spouse name: Not on file  . Number of children: Not on file  . Years of education: Not on file  . Highest education level: Not on file  Social Needs  . Financial resource strain: Not on file  . Food insecurity - worry: Not on file  . Food insecurity - inability: Not on file  . Transportation needs - medical: Not on file  . Transportation needs - non-medical: Not on file  Occupational History  . Not on file  Tobacco Use  . Smoking status: Former Smoker    Packs/day: 0.50    Years: 4.00    Pack years: 2.00    Types: Cigarettes    Last attempt to quit: 02/28/1961    Years since quitting: 55.8  . Smokeless tobacco: Never Used  Substance and Sexual Activity  . Alcohol use: Not on file  . Drug use: Not on file  . Sexual activity: Not on file  Other Topics Concern  . Not on file  Social History Narrative  . Not on file     Allergies  Allergen Reactions  . Latex Hives  . Penicillins Hives  Outpatient Medications Prior to Visit  Medication Sig Dispense Refill  . albuterol (PROVENTIL HFA;VENTOLIN HFA) 108 (90 Base) MCG/ACT inhaler Inhale 1-2 puffs every 6 (six) hours as needed into the lungs for wheezing or shortness of breath.    . ALPRAZolam (XANAX) 0.25 MG tablet Take 0.25 mg at bedtime as needed by mouth for anxiety.    Marland Kitchen amiodarone (PACERONE) 200 MG tablet Take 200 mg daily by mouth.    Marland Kitchen apixaban (ELIQUIS) 5 MG TABS tablet Take 5 mg 2 (two) times daily by mouth.    . budesonide-formoterol (SYMBICORT) 160-4.5 MCG/ACT inhaler Inhale 2 puffs 2 (two) times daily into the lungs.    . calcium acetate (PHOSLO) 667 MG capsule Take 1,334 mg daily by mouth.    . levocetirizine (XYZAL) 5 MG tablet Take 5 mg every evening by mouth.    . montelukast (SINGULAIR) 10 MG tablet Take 10 mg at bedtime by mouth.    . Multiple Vitamin (MULTIVITAMIN WITH MINERALS) TABS tablet Take 1 tablet daily by mouth.    .  Multiple Vitamins-Minerals (OCUVITE PRESERVISION PO) Take 1 tablet 2 (two) times daily by mouth.    Marland Kitchen omeprazole (PRILOSEC) 40 MG capsule Take 40 mg daily by mouth.    . simvastatin (ZOCOR) 10 MG tablet Take 10 mg daily by mouth.    . triamcinolone (NASACORT ALLERGY 24HR) 55 MCG/ACT AERO nasal inhaler Place 2 sprays 2 (two) times daily into the nose.     No facility-administered medications prior to visit.       Review of Systems  Constitutional: Negative for fever and unexpected weight change.  HENT: Negative for congestion, dental problem, ear pain, nosebleeds, postnasal drip, rhinorrhea, sinus pressure, sneezing, sore throat and trouble swallowing.   Eyes: Negative for redness and itching.  Respiratory: Negative for cough, chest tightness, shortness of breath and wheezing.   Cardiovascular: Negative for palpitations and leg swelling.  Gastrointestinal: Negative for nausea and vomiting.  Genitourinary: Negative for dysuria.  Musculoskeletal: Negative for joint swelling.  Skin: Negative for rash.  Neurological: Negative for headaches.  Hematological: Does not bruise/bleed easily.  Psychiatric/Behavioral: Negative for dysphoric mood. The patient is not nervous/anxious.        Objective:   Physical Exam  Vitals:   01/03/17 1107  BP: (!) 148/70  Pulse: (!) 59  SpO2: 97%  Weight: 163 lb (73.9 kg)  Height: 5\' 10"  (1.778 m)   Gen: well appearing, no acute distress HENT: NCAT, OP clear, neck supple without masses Eyes: PERRL, EOMi Lymph: no cervical lymphadenopathy PULM: CTA B CV: RRR, no mgr, no JVD GI: BS+, soft, nontender, no hsm Derm: no rash or skin breakdown MSK: normal bulk and tone Neuro: A&Ox4, CN II-XII intact, strength 5/5 in all 4 extremities Psyche: normal mood and affect   CBC No results found for: WBC, RBC, HGB, HCT, PLT, MCV, MCH, MCHC, RDW, LYMPHSABS, MONOABS, EOSABS, BASOSABS       Assessment & Plan:   Moderate persistent chronic asthma without  complication  Allergic rhinitis due to pollen, unspecified seasonality  Discussion: Mr. Pella comes today to establish care for moderate persistent asthma and allergic rhinitis.  It sounds as if he is got quite a significant allergy history and has been on immunotherapy in the past.  I would like to know the results of prior x-rays, CBC with differential, serum IgE, and spirometry testing so we will request this from his prior pulmonologist.  I will go ahead and give him an as needed  prescription for prednisone as it sounds like he has used this from time to time may be once or twice per year successfully.  Plan on continuing Symbicort and his antihistamine regimen along with nasal steroids and montelukast.  He can follow-up with me in 6 months.  Plan: Asthma: Symbicort 2 puffs twice a day no matter how you feel Use albuterol as needed for chest tightness wheezing or shortness of breath, if you are having to do this more than twice per week for the symptoms I need to know this Give you a prescription for prednisone to use as needed for flareups of your allergic rhinitis or asthma as this has worked for you in the past Call us [place] to prescribe the Symbicort Request records from your prior pulmonologist [place]  Allergic rhinitis: Continue Singulair Keep taking Xyzal as your are doing  We will see you back in 4-6 months or sooner if needed    Current Outpatient Medications:  .  albuterol (PROVENTIL HFA;VENTOLIN HFA) 108 (90 Base) MCG/ACT inhaler, Inhale 1-2 puffs every 6 (six) hours as needed into the lungs for wheezing or shortness of breath., Disp: , Rfl:  .  ALPRAZolam (XANAX) 0.25 MG tablet, Take 0.25 mg at bedtime as needed by mouth for anxiety., Disp: , Rfl:  .  amiodarone (PACERONE) 200 MG tablet, Take 200 mg daily by mouth., Disp: , Rfl:  .  apixaban (ELIQUIS) 5 MG TABS tablet, Take 5 mg 2 (two) times daily by mouth., Disp: , Rfl:  .  budesonide-formoterol (SYMBICORT) 160-4.5  MCG/ACT inhaler, Inhale 2 puffs 2 (two) times daily into the lungs., Disp: , Rfl:  .  calcium acetate (PHOSLO) 667 MG capsule, Take 1,334 mg daily by mouth., Disp: , Rfl:  .  levocetirizine (XYZAL) 5 MG tablet, Take 5 mg every evening by mouth., Disp: , Rfl:  .  montelukast (SINGULAIR) 10 MG tablet, Take 10 mg at bedtime by mouth., Disp: , Rfl:  .  Multiple Vitamin (MULTIVITAMIN WITH MINERALS) TABS tablet, Take 1 tablet daily by mouth., Disp: , Rfl:  .  Multiple Vitamins-Minerals (OCUVITE PRESERVISION PO), Take 1 tablet 2 (two) times daily by mouth., Disp: , Rfl:  .  omeprazole (PRILOSEC) 40 MG capsule, Take 40 mg daily by mouth., Disp: , Rfl:  .  simvastatin (ZOCOR) 10 MG tablet, Take 10 mg daily by mouth., Disp: , Rfl:  .  triamcinolone (NASACORT ALLERGY 24HR) 55 MCG/ACT AERO nasal inhaler, Place 2 sprays 2 (two) times daily into the nose., Disp: , Rfl:  .  predniSONE (DELTASONE) 10 MG tablet, 40mg X3 days, 30mg  X3 days, 20mg  X3 days, 10mg X3 days, then stop., Disp: 30 tablet, Rfl: 0

## 2017-01-27 ENCOUNTER — Encounter: Payer: Self-pay | Admitting: Internal Medicine

## 2017-02-02 DIAGNOSIS — S61251A Open bite of left index finger without damage to nail, initial encounter: Secondary | ICD-10-CM | POA: Diagnosis not present

## 2017-02-02 DIAGNOSIS — W540XXA Bitten by dog, initial encounter: Secondary | ICD-10-CM | POA: Diagnosis not present

## 2017-02-02 DIAGNOSIS — Z23 Encounter for immunization: Secondary | ICD-10-CM | POA: Diagnosis not present

## 2017-02-02 DIAGNOSIS — Z6823 Body mass index (BMI) 23.0-23.9, adult: Secondary | ICD-10-CM | POA: Diagnosis not present

## 2017-02-06 ENCOUNTER — Institutional Professional Consult (permissible substitution): Payer: Self-pay | Admitting: Internal Medicine

## 2017-02-10 ENCOUNTER — Encounter: Payer: Self-pay | Admitting: Internal Medicine

## 2017-02-10 ENCOUNTER — Ambulatory Visit (INDEPENDENT_AMBULATORY_CARE_PROVIDER_SITE_OTHER): Payer: Medicare Other | Admitting: Internal Medicine

## 2017-02-10 VITALS — BP 142/80 | HR 63 | Ht 70.0 in | Wt 165.0 lb

## 2017-02-10 DIAGNOSIS — H353131 Nonexudative age-related macular degeneration, bilateral, early dry stage: Secondary | ICD-10-CM | POA: Diagnosis not present

## 2017-02-10 DIAGNOSIS — H43813 Vitreous degeneration, bilateral: Secondary | ICD-10-CM | POA: Diagnosis not present

## 2017-02-10 DIAGNOSIS — H2513 Age-related nuclear cataract, bilateral: Secondary | ICD-10-CM | POA: Diagnosis not present

## 2017-02-10 DIAGNOSIS — I483 Typical atrial flutter: Secondary | ICD-10-CM

## 2017-02-10 DIAGNOSIS — H52203 Unspecified astigmatism, bilateral: Secondary | ICD-10-CM | POA: Diagnosis not present

## 2017-02-10 NOTE — Progress Notes (Signed)
Electrophysiology Office Note   Date:  02/10/2017   ID:  Chad Morrison, DOB 1932-07-16, MRN [id number]  PCP:  Haywood Pao, MD    Primary Electrophysiologist: Thompson Grayer, MD    Chief Complaint  Patient presents with  . Atrial Flutter     History of Present Illness: Chad Morrison is a 83 y.o. male who presents today for electrophysiology evaluation.   He is referred by Dr Osborne Casco for further evaluation and management of his atrial arrhythmias. The patient is unaware of any history of afib, though my review of Dr Darrold Junker notes suggest that at some point he may have had afib.  08/29/16, he presented for routine pulmonary follow-up and was found to have typical appearing atrial flutter without symptoms.  His heart rates was 150s.  He was evaluated by Dr Suzzette Righter and atrial flutter ablation was advised.  The patient was moving from [place] to [place] and therefore wished to defer the procedure.  He was placed on eliquis and amiodarone.  He has since converted to sinus rhythm.  He is concerned about long term amiodarone or eliquis.  Today, he denies symptoms of palpitations, chest pain,  orthopnea, PND, lower extremity edema, claudication, dizziness, presyncope, syncope, bleeding, or neurologic sequela. The patient is tolerating medications without difficulties and is otherwise without complaint today.    Past Medical History:  Diagnosis Date  . Anxiety   . Asthma   . Asthmatic bronchitis   . Atrial flutter ([facility])    chads2vasc score is 3  . Ehrlichiosis 4098  . GERD (gastroesophageal reflux disease)   . HTN (hypertension)   . Hyperlipidemia   . Paroxysmal atrial fibrillation ([facility])   . Prostate cancer ([place])   . Seasonal allergies      Current Outpatient Medications  Medication Sig Dispense Refill  . albuterol (PROVENTIL HFA;VENTOLIN HFA) 108 (90 Base) MCG/ACT inhaler Inhale 1-2 puffs every 6 (six) hours as needed into the lungs for wheezing or shortness of breath.    .  ALPRAZolam (XANAX) 0.25 MG tablet Take 0.25 mg at bedtime as needed by mouth for anxiety.    Marland Kitchen amiodarone (PACERONE) 200 MG tablet Take 200 mg daily by mouth.    Marland Kitchen apixaban (ELIQUIS) 5 MG TABS tablet Take 5 mg 2 (two) times daily by mouth.    . budesonide-formoterol (SYMBICORT) 160-4.5 MCG/ACT inhaler Inhale 2 puffs 2 (two) times daily into the lungs.    . calcium acetate (PHOSLO) 667 MG capsule Take 1,334 mg daily by mouth.    . levocetirizine (XYZAL) 5 MG tablet Take 5 mg every evening by mouth.    . montelukast (SINGULAIR) 10 MG tablet Take 10 mg at bedtime by mouth.    . Multiple Vitamin (MULTIVITAMIN WITH MINERALS) TABS tablet Take 1 tablet daily by mouth.    . Multiple Vitamins-Minerals (OCUVITE PRESERVISION PO) Take 1 tablet 2 (two) times daily by mouth.    Marland Kitchen omeprazole (PRILOSEC) 40 MG capsule Take 40 mg daily by mouth.    . predniSONE (DELTASONE) 10 MG tablet 40mg X3 days, 30mg  X3 days, 20mg  X3 days, 10mg X3 days, then stop. 30 tablet 0  . simvastatin (ZOCOR) 10 MG tablet Take 10 mg daily by mouth.    . triamcinolone (NASACORT ALLERGY 24HR) 55 MCG/ACT AERO nasal inhaler Place 2 sprays 2 (two) times daily into the nose.     No current facility-administered medications for this visit.     Allergies:   Latex and Penicillins   Social History:  The patient  reports that he quit smoking about 55 years ago. His smoking use included cigarettes. He has a 2.00 pack-year smoking history. he has never used smokeless tobacco. He reports that he drinks alcohol. He reports that he does not use drugs.   Family History:  The patient's  family history includes Allergies in his mother; Asthma (age of onset: 84) in his mother; CVA (age of onset: 11) in his father; Diabetes Mellitus I in his son; Heart Problems in his father; Migraines in his son; Other in his father; Other (age of onset: 31) in his brother; Ulcers in his mother.    ROS:  Please see the history of present illness.   All other systems are  personally reviewed and negative.    PHYSICAL EXAM: VS:  BP (!) 142/80   Pulse 63   Ht 5\' 10"  (1.778 m)   Wt 165 lb (74.8 kg)   SpO2 (!) 9%   BMI 23.68 kg/m  , BMI Body mass index is 23.68 kg/m. GEN: Well nourished, well developed, in no acute distress  HEENT: normal  Neck: no JVD, carotid bruits, or masses Cardiac: RRR; no murmurs, rubs, or gallops,no edema  Respiratory:  clear to auscultation bilaterally, normal work of breathing GI: soft, nontender, nondistended, + BS MS: no deformity or atrophy  Skin: warm and dry  Neuro:  Strength and sensation are intact Psych: euthymic mood, full affect  EKG:  EKG is ordered today. The ekg ordered today is personally reviewed and shows sinus rhythm 63 bpm, RBBB, inferior infarct, LVH  ekg 08/29/16 reveals typical appearing atrail flutter with RVR (154 bpm), RBBB  Wt Readings from Last 3 Encounters:  02/10/17 165 lb (74.8 kg)  01/03/17 163 lb (73.9 kg)      Other studies personally reviewed: Additional studies/ records that were reviewed today include: echo 09/08/16 and 17 pages of records personally reviewed from primary cardiologist  Review of the above records today demonstrates: EF 60%, normal RV function, mild LA enlargement, mild LVH, mild to moderate aortic sclerosis   ASSESSMENT AND PLAN:  1.  Atrial flutter (Typical) The patient has recent atrial flutter with RVR.  He is now anticoagulated with eliquis for chads2vasc score of 3.  He is also on amiodarone.  He worries about long term risks of these medicines. Therapeutic strategies for atrial flutter including medicine and ablation were discussed in detail with the patient today. Risk, benefits, and alternatives to EP study and radiofrequency ablation were also discussed in detail today. These risks include but are not limited to stroke, bleeding, vascular damage, tamponade, perforation, damage to the heart and other structures, AV block requiring pacemaker, worsening renal  function, and death. The patient understands these risk and wishes to proceed.  We will therefore proceed with catheter ablation at the next available time.  carto and anesthesia are requested.  Though there is suggestion of atrial fibrillation, this has not been well documented and the patient does not recall.  We will assess with EPS.  Consider implantable loop recorder in the future with plans to stop anticoagulation if no afib is detected.   Current medicines are reviewed at length with the patient today.   The patient does not have concerns regarding his medicines.  The following changes were made today:  none   Signed, Thompson Grayer, MD  02/10/2017 11:20 AM     [facility] [address] [place] [address] [phone] (office) [phone] (fax)

## 2017-02-10 NOTE — Patient Instructions (Signed)
Medication Instructions:  Your physician recommends that you continue on your current medications as directed. Please refer to the Current Medication list given to you today.   Labwork: Your physician recommends that you return for lab work on 03/07/17 at [facility] do not have to fast   Testing/Procedures: Your physician has recommended that you have an ablation. Catheter ablation is a medical procedure used to treat some cardiac arrhythmias (irregular heartbeats). During catheter ablation, a long, thin, flexible tube is put into a blood vessel in your groin (upper thigh), or neck. This tube is called an ablation catheter. It is then guided to your heart through the blood vessel. Radio frequency waves destroy small areas of heart tissue where abnormal heartbeats may cause an arrhythmia to start. Please see the instruction sheet given to you today.---03/14/17  Please arrive at [facility] [place] not eat or drink after midnight the night prior to the procedure Do not take any medications the morning of the test Plan for one night stay Will need someone to drive you home at discharge      Follow-Up: Your physician recommends that you schedule a follow-up appointment in: 4 weeks from 03/14/17 with Dr. [name]   Any Other Special Instructions Will Be Listed Below (If Applicable).     If you need a refill on your cardiac medications before your next appointment, please call your pharmacy.

## 2017-02-13 ENCOUNTER — Telehealth: Payer: Self-pay | Admitting: Internal Medicine

## 2017-02-13 NOTE — Telephone Encounter (Signed)
New message      Patient is calling to reschedule his procedure from January to May, he has asthma and he does not want to have this done in winter.

## 2017-02-13 NOTE — Telephone Encounter (Signed)
Will have patient see Dr [name] again in May for ablation to be rescheduled.  Melissa will call to reschedule.

## 2017-03-07 ENCOUNTER — Other Ambulatory Visit: Payer: Medicare Other

## 2017-03-14 ENCOUNTER — Encounter (HOSPITAL_COMMUNITY): Payer: Self-pay

## 2017-03-14 ENCOUNTER — Ambulatory Visit (HOSPITAL_COMMUNITY): Admit: 2017-03-14 | Payer: Medicare Other | Admitting: Internal Medicine

## 2017-03-14 SURGERY — A-FLUTTER ABLATION
Anesthesia: Monitor Anesthesia Care

## 2017-04-04 DIAGNOSIS — H10501 Unspecified blepharoconjunctivitis, right eye: Secondary | ICD-10-CM | POA: Diagnosis not present

## 2017-04-07 ENCOUNTER — Encounter: Payer: Self-pay | Admitting: Internal Medicine

## 2017-04-08 DIAGNOSIS — Z7951 Long term (current) use of inhaled steroids: Secondary | ICD-10-CM | POA: Diagnosis not present

## 2017-04-08 DIAGNOSIS — H532 Diplopia: Secondary | ICD-10-CM | POA: Diagnosis present

## 2017-04-08 DIAGNOSIS — H05011 Cellulitis of right orbit: Secondary | ICD-10-CM | POA: Diagnosis not present

## 2017-04-08 DIAGNOSIS — K219 Gastro-esophageal reflux disease without esophagitis: Secondary | ICD-10-CM | POA: Diagnosis not present

## 2017-04-08 DIAGNOSIS — J45909 Unspecified asthma, uncomplicated: Secondary | ICD-10-CM | POA: Diagnosis not present

## 2017-04-08 DIAGNOSIS — J3489 Other specified disorders of nose and nasal sinuses: Secondary | ICD-10-CM | POA: Diagnosis not present

## 2017-04-08 DIAGNOSIS — H05019 Cellulitis of unspecified orbit: Secondary | ICD-10-CM | POA: Diagnosis not present

## 2017-04-08 DIAGNOSIS — I4892 Unspecified atrial flutter: Secondary | ICD-10-CM | POA: Diagnosis not present

## 2017-04-08 DIAGNOSIS — Z6822 Body mass index (BMI) 22.0-22.9, adult: Secondary | ICD-10-CM | POA: Diagnosis not present

## 2017-04-08 DIAGNOSIS — Z6821 Body mass index (BMI) 21.0-21.9, adult: Secondary | ICD-10-CM | POA: Diagnosis not present

## 2017-04-08 DIAGNOSIS — Z880 Allergy status to penicillin: Secondary | ICD-10-CM | POA: Diagnosis not present

## 2017-04-08 DIAGNOSIS — J329 Chronic sinusitis, unspecified: Secondary | ICD-10-CM | POA: Diagnosis not present

## 2017-04-08 DIAGNOSIS — H269 Unspecified cataract: Secondary | ICD-10-CM | POA: Diagnosis present

## 2017-04-08 DIAGNOSIS — E785 Hyperlipidemia, unspecified: Secondary | ICD-10-CM | POA: Diagnosis not present

## 2017-04-08 DIAGNOSIS — J320 Chronic maxillary sinusitis: Secondary | ICD-10-CM | POA: Diagnosis not present

## 2017-04-08 DIAGNOSIS — E7800 Pure hypercholesterolemia, unspecified: Secondary | ICD-10-CM | POA: Diagnosis present

## 2017-04-08 DIAGNOSIS — H5711 Ocular pain, right eye: Secondary | ICD-10-CM | POA: Diagnosis not present

## 2017-04-08 DIAGNOSIS — Z882 Allergy status to sulfonamides status: Secondary | ICD-10-CM | POA: Diagnosis not present

## 2017-04-08 DIAGNOSIS — J019 Acute sinusitis, unspecified: Secondary | ICD-10-CM | POA: Diagnosis not present

## 2017-04-08 DIAGNOSIS — J339 Nasal polyp, unspecified: Secondary | ICD-10-CM | POA: Diagnosis present

## 2017-04-10 ENCOUNTER — Ambulatory Visit: Payer: Medicare Other | Admitting: Internal Medicine

## 2017-04-13 ENCOUNTER — Emergency Department (HOSPITAL_COMMUNITY)
Admission: EM | Admit: 2017-04-13 | Discharge: 2017-04-13 | Discharge status: Against Medical Advice | Payer: Medicare Other | Attending: Emergency Medicine | Admitting: Emergency Medicine

## 2017-04-13 DIAGNOSIS — J338 Other polyp of sinus: Secondary | ICD-10-CM | POA: Diagnosis not present

## 2017-04-13 DIAGNOSIS — M60009 Infective myositis, unspecified site: Secondary | ICD-10-CM | POA: Diagnosis not present

## 2017-04-13 DIAGNOSIS — H5711 Ocular pain, right eye: Secondary | ICD-10-CM | POA: Diagnosis not present

## 2017-04-13 DIAGNOSIS — Z792 Long term (current) use of antibiotics: Secondary | ICD-10-CM | POA: Diagnosis not present

## 2017-04-13 DIAGNOSIS — M3130 Wegener's granulomatosis without renal involvement: Secondary | ICD-10-CM | POA: Diagnosis not present

## 2017-04-13 DIAGNOSIS — H5710 Ocular pain, unspecified eye: Secondary | ICD-10-CM | POA: Diagnosis not present

## 2017-04-13 DIAGNOSIS — I671 Cerebral aneurysm, nonruptured: Secondary | ICD-10-CM | POA: Diagnosis not present

## 2017-04-13 DIAGNOSIS — Z87891 Personal history of nicotine dependence: Secondary | ICD-10-CM | POA: Diagnosis not present

## 2017-04-13 DIAGNOSIS — H02842 Edema of right lower eyelid: Secondary | ICD-10-CM | POA: Diagnosis not present

## 2017-04-13 DIAGNOSIS — Z5321 Procedure and treatment not carried out due to patient leaving prior to being seen by health care provider: Secondary | ICD-10-CM | POA: Insufficient documentation

## 2017-04-13 DIAGNOSIS — J328 Other chronic sinusitis: Secondary | ICD-10-CM | POA: Diagnosis not present

## 2017-04-13 DIAGNOSIS — J339 Nasal polyp, unspecified: Secondary | ICD-10-CM | POA: Diagnosis not present

## 2017-04-13 DIAGNOSIS — R51 Headache: Secondary | ICD-10-CM | POA: Diagnosis not present

## 2017-04-13 DIAGNOSIS — J322 Chronic ethmoidal sinusitis: Secondary | ICD-10-CM | POA: Diagnosis present

## 2017-04-13 DIAGNOSIS — T462X5A Adverse effect of other antidysrhythmic drugs, initial encounter: Secondary | ICD-10-CM | POA: Diagnosis not present

## 2017-04-13 DIAGNOSIS — I4892 Unspecified atrial flutter: Secondary | ICD-10-CM | POA: Diagnosis not present

## 2017-04-13 DIAGNOSIS — J3489 Other specified disorders of nose and nasal sinuses: Secondary | ICD-10-CM | POA: Diagnosis not present

## 2017-04-13 DIAGNOSIS — E032 Hypothyroidism due to medicaments and other exogenous substances: Secondary | ICD-10-CM | POA: Diagnosis not present

## 2017-04-13 DIAGNOSIS — K219 Gastro-esophageal reflux disease without esophagitis: Secondary | ICD-10-CM | POA: Diagnosis not present

## 2017-04-13 DIAGNOSIS — H05011 Cellulitis of right orbit: Secondary | ICD-10-CM | POA: Diagnosis not present

## 2017-04-13 DIAGNOSIS — Z66 Do not resuscitate: Secondary | ICD-10-CM | POA: Diagnosis not present

## 2017-04-13 DIAGNOSIS — D649 Anemia, unspecified: Secondary | ICD-10-CM | POA: Diagnosis not present

## 2017-04-13 DIAGNOSIS — R0981 Nasal congestion: Secondary | ICD-10-CM | POA: Diagnosis not present

## 2017-04-13 DIAGNOSIS — H269 Unspecified cataract: Secondary | ICD-10-CM | POA: Diagnosis present

## 2017-04-13 DIAGNOSIS — J0121 Acute recurrent ethmoidal sinusitis: Secondary | ICD-10-CM | POA: Diagnosis not present

## 2017-04-13 DIAGNOSIS — H532 Diplopia: Secondary | ICD-10-CM | POA: Diagnosis present

## 2017-04-13 DIAGNOSIS — J309 Allergic rhinitis, unspecified: Secondary | ICD-10-CM | POA: Diagnosis not present

## 2017-04-13 DIAGNOSIS — J45909 Unspecified asthma, uncomplicated: Secondary | ICD-10-CM | POA: Diagnosis present

## 2017-04-13 DIAGNOSIS — Z959 Presence of cardiac and vascular implant and graft, unspecified: Secondary | ICD-10-CM | POA: Diagnosis not present

## 2017-04-13 DIAGNOSIS — J329 Chronic sinusitis, unspecified: Secondary | ICD-10-CM | POA: Diagnosis not present

## 2017-04-13 DIAGNOSIS — H539 Unspecified visual disturbance: Secondary | ICD-10-CM | POA: Diagnosis not present

## 2017-04-13 DIAGNOSIS — T8130XA Disruption of wound, unspecified, initial encounter: Secondary | ICD-10-CM | POA: Diagnosis not present

## 2017-04-13 DIAGNOSIS — F419 Anxiety disorder, unspecified: Secondary | ICD-10-CM | POA: Diagnosis not present

## 2017-04-13 DIAGNOSIS — D509 Iron deficiency anemia, unspecified: Secondary | ICD-10-CM | POA: Diagnosis not present

## 2017-04-13 DIAGNOSIS — H0520 Unspecified exophthalmos: Secondary | ICD-10-CM | POA: Diagnosis not present

## 2017-04-13 DIAGNOSIS — Z7901 Long term (current) use of anticoagulants: Secondary | ICD-10-CM | POA: Diagnosis not present

## 2017-04-13 DIAGNOSIS — E039 Hypothyroidism, unspecified: Secondary | ICD-10-CM | POA: Diagnosis not present

## 2017-04-13 NOTE — ED Notes (Signed)
While triaging the pt, wife and pt asked if we have a ophthalmology on this ED, this RN respond that he will be assess by ED provider and they will consult to any other specialty if the need to, but we don't have an eye doctor on the hospital the ED provider will assess with consult or will refer him there, pt states he will go to [facility] where he was last week to be se by a ophthalmology.

## 2017-04-21 DIAGNOSIS — Z7722 Contact with and (suspected) exposure to environmental tobacco smoke (acute) (chronic): Secondary | ICD-10-CM | POA: Diagnosis present

## 2017-04-21 DIAGNOSIS — Z7901 Long term (current) use of anticoagulants: Secondary | ICD-10-CM | POA: Diagnosis not present

## 2017-04-21 DIAGNOSIS — H5789 Other specified disorders of eye and adnexa: Secondary | ICD-10-CM | POA: Diagnosis not present

## 2017-04-21 DIAGNOSIS — J320 Chronic maxillary sinusitis: Secondary | ICD-10-CM | POA: Diagnosis not present

## 2017-04-21 DIAGNOSIS — Z95828 Presence of other vascular implants and grafts: Secondary | ICD-10-CM | POA: Diagnosis not present

## 2017-04-21 DIAGNOSIS — E785 Hyperlipidemia, unspecified: Secondary | ICD-10-CM | POA: Diagnosis not present

## 2017-04-21 DIAGNOSIS — G4700 Insomnia, unspecified: Secondary | ICD-10-CM | POA: Diagnosis present

## 2017-04-21 DIAGNOSIS — E039 Hypothyroidism, unspecified: Secondary | ICD-10-CM | POA: Diagnosis present

## 2017-04-21 DIAGNOSIS — H0510 Unspecified chronic inflammatory disorders of orbit: Secondary | ICD-10-CM | POA: Diagnosis present

## 2017-04-21 DIAGNOSIS — H0520 Unspecified exophthalmos: Secondary | ICD-10-CM | POA: Diagnosis present

## 2017-04-21 DIAGNOSIS — Z7951 Long term (current) use of inhaled steroids: Secondary | ICD-10-CM | POA: Diagnosis not present

## 2017-04-21 DIAGNOSIS — I720 Aneurysm of carotid artery: Secondary | ICD-10-CM | POA: Diagnosis not present

## 2017-04-21 DIAGNOSIS — H05011 Cellulitis of right orbit: Secondary | ICD-10-CM | POA: Diagnosis not present

## 2017-04-21 DIAGNOSIS — J322 Chronic ethmoidal sinusitis: Secondary | ICD-10-CM | POA: Diagnosis present

## 2017-04-21 DIAGNOSIS — M6008 Infective myositis, other site: Secondary | ICD-10-CM | POA: Diagnosis present

## 2017-04-21 DIAGNOSIS — E038 Other specified hypothyroidism: Secondary | ICD-10-CM | POA: Diagnosis not present

## 2017-04-21 DIAGNOSIS — H05019 Cellulitis of unspecified orbit: Secondary | ICD-10-CM | POA: Diagnosis not present

## 2017-04-21 DIAGNOSIS — Z452 Encounter for adjustment and management of vascular access device: Secondary | ICD-10-CM | POA: Diagnosis not present

## 2017-04-21 DIAGNOSIS — K219 Gastro-esophageal reflux disease without esophagitis: Secondary | ICD-10-CM | POA: Diagnosis not present

## 2017-04-21 DIAGNOSIS — I671 Cerebral aneurysm, nonruptured: Secondary | ICD-10-CM | POA: Diagnosis not present

## 2017-04-21 DIAGNOSIS — I4892 Unspecified atrial flutter: Secondary | ICD-10-CM | POA: Diagnosis present

## 2017-04-21 DIAGNOSIS — J45909 Unspecified asthma, uncomplicated: Secondary | ICD-10-CM | POA: Diagnosis present

## 2017-04-21 DIAGNOSIS — I480 Paroxysmal atrial fibrillation: Secondary | ICD-10-CM | POA: Diagnosis not present

## 2017-04-26 DIAGNOSIS — E785 Hyperlipidemia, unspecified: Secondary | ICD-10-CM | POA: Diagnosis not present

## 2017-04-26 DIAGNOSIS — Z792 Long term (current) use of antibiotics: Secondary | ICD-10-CM | POA: Diagnosis not present

## 2017-04-26 DIAGNOSIS — Z91040 Latex allergy status: Secondary | ICD-10-CM | POA: Diagnosis not present

## 2017-04-26 DIAGNOSIS — J3489 Other specified disorders of nose and nasal sinuses: Secondary | ICD-10-CM | POA: Diagnosis not present

## 2017-04-26 DIAGNOSIS — Z9889 Other specified postprocedural states: Secondary | ICD-10-CM | POA: Diagnosis not present

## 2017-04-26 DIAGNOSIS — Z882 Allergy status to sulfonamides status: Secondary | ICD-10-CM | POA: Diagnosis not present

## 2017-04-26 DIAGNOSIS — G4700 Insomnia, unspecified: Secondary | ICD-10-CM | POA: Diagnosis not present

## 2017-04-26 DIAGNOSIS — J328 Other chronic sinusitis: Secondary | ICD-10-CM | POA: Diagnosis not present

## 2017-04-26 DIAGNOSIS — K219 Gastro-esophageal reflux disease without esophagitis: Secondary | ICD-10-CM | POA: Diagnosis not present

## 2017-04-26 DIAGNOSIS — Z7952 Long term (current) use of systemic steroids: Secondary | ICD-10-CM | POA: Diagnosis not present

## 2017-04-26 DIAGNOSIS — Z48813 Encounter for surgical aftercare following surgery on the respiratory system: Secondary | ICD-10-CM | POA: Diagnosis not present

## 2017-04-26 DIAGNOSIS — I4891 Unspecified atrial fibrillation: Secondary | ICD-10-CM | POA: Diagnosis not present

## 2017-04-26 DIAGNOSIS — L03213 Periorbital cellulitis: Secondary | ICD-10-CM | POA: Diagnosis not present

## 2017-04-26 DIAGNOSIS — I96 Gangrene, not elsewhere classified: Secondary | ICD-10-CM | POA: Diagnosis not present

## 2017-04-26 DIAGNOSIS — Z7901 Long term (current) use of anticoagulants: Secondary | ICD-10-CM | POA: Diagnosis not present

## 2017-04-26 DIAGNOSIS — E039 Hypothyroidism, unspecified: Secondary | ICD-10-CM | POA: Diagnosis not present

## 2017-04-26 DIAGNOSIS — L929 Granulomatous disorder of the skin and subcutaneous tissue, unspecified: Secondary | ICD-10-CM | POA: Diagnosis not present

## 2017-04-26 DIAGNOSIS — Z452 Encounter for adjustment and management of vascular access device: Secondary | ICD-10-CM | POA: Diagnosis not present

## 2017-04-26 DIAGNOSIS — J329 Chronic sinusitis, unspecified: Secondary | ICD-10-CM | POA: Diagnosis not present

## 2017-04-26 DIAGNOSIS — H05011 Cellulitis of right orbit: Secondary | ICD-10-CM | POA: Diagnosis not present

## 2017-04-26 DIAGNOSIS — Z880 Allergy status to penicillin: Secondary | ICD-10-CM | POA: Diagnosis not present

## 2017-04-27 DIAGNOSIS — H05111 Granuloma of right orbit: Secondary | ICD-10-CM | POA: Diagnosis not present

## 2017-04-27 DIAGNOSIS — J3089 Other allergic rhinitis: Secondary | ICD-10-CM | POA: Diagnosis not present

## 2017-04-27 DIAGNOSIS — H05011 Cellulitis of right orbit: Secondary | ICD-10-CM | POA: Diagnosis not present

## 2017-04-28 DIAGNOSIS — L928 Other granulomatous disorders of the skin and subcutaneous tissue: Secondary | ICD-10-CM | POA: Diagnosis not present

## 2017-04-28 DIAGNOSIS — Z6824 Body mass index (BMI) 24.0-24.9, adult: Secondary | ICD-10-CM | POA: Diagnosis not present

## 2017-04-28 DIAGNOSIS — L03818 Cellulitis of other sites: Secondary | ICD-10-CM | POA: Diagnosis not present

## 2017-05-03 DIAGNOSIS — K219 Gastro-esophageal reflux disease without esophagitis: Secondary | ICD-10-CM | POA: Diagnosis not present

## 2017-05-03 DIAGNOSIS — J329 Chronic sinusitis, unspecified: Secondary | ICD-10-CM | POA: Diagnosis not present

## 2017-05-03 DIAGNOSIS — H05011 Cellulitis of right orbit: Secondary | ICD-10-CM | POA: Diagnosis not present

## 2017-05-03 DIAGNOSIS — Z48813 Encounter for surgical aftercare following surgery on the respiratory system: Secondary | ICD-10-CM | POA: Diagnosis not present

## 2017-05-03 DIAGNOSIS — Z792 Long term (current) use of antibiotics: Secondary | ICD-10-CM | POA: Diagnosis not present

## 2017-05-03 DIAGNOSIS — I4891 Unspecified atrial fibrillation: Secondary | ICD-10-CM | POA: Diagnosis not present

## 2017-05-03 DIAGNOSIS — E039 Hypothyroidism, unspecified: Secondary | ICD-10-CM | POA: Diagnosis not present

## 2017-05-08 DIAGNOSIS — Z48813 Encounter for surgical aftercare following surgery on the respiratory system: Secondary | ICD-10-CM | POA: Diagnosis not present

## 2017-05-08 DIAGNOSIS — K219 Gastro-esophageal reflux disease without esophagitis: Secondary | ICD-10-CM | POA: Diagnosis not present

## 2017-05-08 DIAGNOSIS — H05011 Cellulitis of right orbit: Secondary | ICD-10-CM | POA: Diagnosis not present

## 2017-05-08 DIAGNOSIS — I4891 Unspecified atrial fibrillation: Secondary | ICD-10-CM | POA: Diagnosis not present

## 2017-05-08 DIAGNOSIS — E039 Hypothyroidism, unspecified: Secondary | ICD-10-CM | POA: Diagnosis not present

## 2017-05-08 DIAGNOSIS — J329 Chronic sinusitis, unspecified: Secondary | ICD-10-CM | POA: Diagnosis not present

## 2017-05-09 DIAGNOSIS — H0510 Unspecified chronic inflammatory disorders of orbit: Secondary | ICD-10-CM | POA: Diagnosis not present

## 2017-05-09 DIAGNOSIS — I776 Arteritis, unspecified: Secondary | ICD-10-CM | POA: Diagnosis not present

## 2017-05-09 DIAGNOSIS — Z79899 Other long term (current) drug therapy: Secondary | ICD-10-CM | POA: Diagnosis not present

## 2017-05-15 ENCOUNTER — Ambulatory Visit (INDEPENDENT_AMBULATORY_CARE_PROVIDER_SITE_OTHER): Payer: Medicare Other | Admitting: Pulmonary Disease

## 2017-05-15 ENCOUNTER — Encounter: Payer: Self-pay | Admitting: Pulmonary Disease

## 2017-05-15 VITALS — BP 142/92 | HR 82 | Ht 70.0 in | Wt 177.0 lb

## 2017-05-15 DIAGNOSIS — Z5181 Encounter for therapeutic drug level monitoring: Secondary | ICD-10-CM | POA: Diagnosis not present

## 2017-05-15 DIAGNOSIS — J4540 Moderate persistent asthma, uncomplicated: Secondary | ICD-10-CM

## 2017-05-15 DIAGNOSIS — J301 Allergic rhinitis due to pollen: Secondary | ICD-10-CM

## 2017-05-15 NOTE — Patient Instructions (Signed)
Asthma: Mild persistent asthma Continue Symbicort 2 puffs twice a day no matter how you feel Use albuterol as needed for chest tightness wheezing or shortness of breath  Small vessel vasculitis: Continue methotrexate as directed by your physicians at [facility] prednisone as directed by your physicians at [facility] We will monitor for lung toxicity with lung function testing today and again in 6 months  Let us know if you have any difficulty breathing, would be happy to see you here  We will see you back in 6 months or sooner if needed

## 2017-05-15 NOTE — Progress Notes (Signed)
Subjective:    Patient ID: Chad Morrison, male    DOB: 1932-10-11, 84 y.o.   MRN: [id number]  Synopsis: Referred in 2018 for evaluation of asthma.  He was diagnosed with ANCA associated vasculitis in 2019.  HPI Chief Complaint  Patient presents with  . Follow-up    pt denies any breathing complaints.    Chad Morrison has been diagnosed with ANCA associated vasculitis. > he started having R sided sinus pain and visual changes (double visit) > he had a sinus biopsy and surgery and was told he had an ANCA associated vasculitis > he was actually hospitalized several times > he was on prednisone, just started methotrexate last week > his vision has improved > he hasn't noticed any breathing difficulty since starting the methotrexate  He has been having more congestion from his nose lately.    His asthma has been well controlled.   Past Medical History:  Diagnosis Date  . Anxiety   . Asthma   . Asthmatic bronchitis   . Atrial flutter (HCC)    chads2vasc score is 3  . Ehrlichiosis 9833  . GERD (gastroesophageal reflux disease)   . HTN (hypertension)   . Hyperlipidemia   . Paroxysmal atrial fibrillation (HCC)   . Prostate cancer ([place])   . Seasonal allergies          Review of Systems  Constitutional: Negative for fever and unexpected weight change.  HENT: Negative for congestion, dental problem, ear pain, nosebleeds, postnasal drip, rhinorrhea, sinus pressure, sneezing, sore throat and trouble swallowing.   Eyes: Negative for redness and itching.  Respiratory: Negative for cough, chest tightness, shortness of breath and wheezing.   Cardiovascular: Negative for palpitations and leg swelling.  Gastrointestinal: Negative for nausea and vomiting.  Genitourinary: Negative for dysuria.  Musculoskeletal: Negative for joint swelling.  Skin: Negative for rash.  Neurological: Negative for headaches.  Hematological: Does not bruise/bleed easily.  Psychiatric/Behavioral: Negative for  dysphoric mood. The patient is not nervous/anxious.        Objective:   Physical Exam  Vitals:   05/15/17 1018  BP: (!) 142/92  Pulse: 82  SpO2: 99%  Weight: 177 lb (80.3 kg)  Height: 5\' 10"  (1.778 m)    Gen: chronically ill appearing HENT: OP clear, TM's clear, neck supple PULM: CTA B, normal percussion CV: RRR, no mgr, trace edema GI: BS+, soft, nontender Derm: no cyanosis or rash Psyche: normal mood and affect   CBC No results found for: WBC, RBC, HGB, HCT, PLT, MCV, MCH, MCHC, RDW, LYMPHSABS, MONOABS, EOSABS, BASOSABS  Records from his visit to [facility] ophthalmology reviewed where he has been treated for an ANCA associated vasculitis     Assessment & Plan:   Therapeutic drug monitoring - Plan: Pulmonary function test  Moderate persistent chronic asthma without complication  Allergic rhinitis due to pollen, unspecified seasonality  Chad Morrison was just diagnosed with small vessel vasculitis.  Interestingly this can be associated with asthma slight symptoms as well as necrotizing pneumonia, both of which are problems that he had a [place] prior to moving to [place].  His asthma has been well controlled recently and is not having any problems with Symbicort.  We did talk about the risk of methotrexate induced lung injury today.  I think we should monitor for that with lung function testing.  Plan: Asthma: Mild persistent asthma Continue Symbicort 2 puffs twice a day no matter how you feel Use albuterol as needed for chest tightness wheezing or shortness  of breath  Small vessel vasculitis: Continue methotrexate as directed by your physicians at [facility] prednisone as directed by your physicians at [facility] We will monitor for lung toxicity with lung function testing today and again in 6 months  Let us know if you have any difficulty breathing, would be happy to see you here  We will see you back in 6 months or sooner if needed   Current Outpatient  Medications:  .  albuterol (PROVENTIL HFA;VENTOLIN HFA) 108 (90 Base) MCG/ACT inhaler, Inhale 1-2 puffs every 6 (six) hours as needed into the lungs for wheezing or shortness of breath., Disp: , Rfl:  .  ALPRAZolam (XANAX) 0.25 MG tablet, Take 0.25 mg at bedtime as needed by mouth for anxiety., Disp: , Rfl:  .  amiodarone (PACERONE) 200 MG tablet, Take 200 mg daily by mouth., Disp: , Rfl:  .  apixaban (ELIQUIS) 5 MG TABS tablet, Take 5 mg 2 (two) times daily by mouth., Disp: , Rfl:  .  budesonide-formoterol (SYMBICORT) 160-4.5 MCG/ACT inhaler, Inhale 2 puffs 2 (two) times daily into the lungs., Disp: , Rfl:  .  calcium acetate (PHOSLO) 667 MG capsule, Take 1,334 mg daily by mouth., Disp: , Rfl:  .  folic acid (FOLVITE) 1 MG tablet, Take 1 mg by mouth daily., Disp: , Rfl:  .  levocetirizine (XYZAL) 5 MG tablet, Take 5 mg every evening by mouth., Disp: , Rfl:  .  methotrexate (RHEUMATREX) 2.5 MG tablet, Take 12.5 mg by mouth once a week. Caution:Chemotherapy. Protect from light., Disp: , Rfl:  .  montelukast (SINGULAIR) 10 MG tablet, Take 10 mg at bedtime by mouth., Disp: , Rfl:  .  Multiple Vitamin (MULTIVITAMIN WITH MINERALS) TABS tablet, Take 1 tablet daily by mouth., Disp: , Rfl:  .  Multiple Vitamins-Minerals (OCUVITE PRESERVISION PO), Take 1 tablet 2 (two) times daily by mouth., Disp: , Rfl:  .  omeprazole (PRILOSEC) 40 MG capsule, Take 40 mg daily by mouth., Disp: , Rfl:  .  predniSONE (DELTASONE) 10 MG tablet, 40mg X3 days, 30mg  X3 days, 20mg  X3 days, 10mg X3 days, then stop. (Patient taking differently: 50mg  X2 wks, 40mg X2 wk, 30mg  X2wk, 20mg  X2 wk, 10mg X2 wk, then stop.), Disp: 30 tablet, Rfl: 0 .  simvastatin (ZOCOR) 10 MG tablet, Take 10 mg daily by mouth., Disp: , Rfl:  .  triamcinolone (NASACORT ALLERGY 24HR) 55 MCG/ACT AERO nasal inhaler, Place 2 sprays 2 (two) times daily into the nose., Disp: , Rfl:

## 2017-05-23 DIAGNOSIS — E032 Hypothyroidism due to medicaments and other exogenous substances: Secondary | ICD-10-CM | POA: Diagnosis not present

## 2017-05-23 DIAGNOSIS — T462X5A Adverse effect of other antidysrhythmic drugs, initial encounter: Secondary | ICD-10-CM | POA: Diagnosis not present

## 2017-05-23 DIAGNOSIS — E039 Hypothyroidism, unspecified: Secondary | ICD-10-CM | POA: Diagnosis not present

## 2017-05-24 ENCOUNTER — Ambulatory Visit (INDEPENDENT_AMBULATORY_CARE_PROVIDER_SITE_OTHER): Payer: Medicare Other | Admitting: Pulmonary Disease

## 2017-05-24 DIAGNOSIS — Z5181 Encounter for therapeutic drug level monitoring: Secondary | ICD-10-CM

## 2017-05-24 LAB — PULMONARY FUNCTION TEST
DL/VA % pred: 66 %
DL/VA: 3.06 ml/min/mmHg/L
DLCO unc % pred: 70 %
DLCO unc: 22.81 ml/min/mmHg
FEF 25-75 Post: 1.59 L/sec
FEF 25-75 Pre: 1.04 L/sec
FEF2575-%Change-Post: 51 %
FEF2575-%Pred-Post: 86 %
FEF2575-%Pred-Pre: 57 %
FEV1-%Change-Post: 10 %
FEV1-%Pred-Post: 88 %
FEV1-%Pred-Pre: 80 %
FEV1-Post: 2.44 L
FEV1-Pre: 2.21 L
FEV1FVC-%Change-Post: 0 %
FEV1FVC-%Pred-Pre: 91 %
FEV6-%Change-Post: 10 %
FEV6-%Pred-Post: 101 %
FEV6-%Pred-Pre: 91 %
FEV6-Post: 3.67 L
FEV6-Pre: 3.32 L
FEV6FVC-%Change-Post: -1 %
FEV6FVC-%Pred-Post: 104 %
FEV6FVC-%Pred-Pre: 105 %
FVC-%Change-Post: 11 %
FVC-%Pred-Post: 96 %
FVC-%Pred-Pre: 87 %
FVC-Post: 3.78 L
FVC-Pre: 3.4 L
Post FEV1/FVC ratio: 65 %
Post FEV6/FVC ratio: 97 %
Pre FEV1/FVC ratio: 65 %
Pre FEV6/FVC Ratio: 98 %
RV % pred: 78 %
RV: 2.14 L
TLC % pred: 104 %
TLC: 7.41 L

## 2017-05-24 NOTE — Progress Notes (Signed)
PFT completed today 05/24/17

## 2017-05-25 DIAGNOSIS — H05111 Granuloma of right orbit: Secondary | ICD-10-CM | POA: Diagnosis not present

## 2017-05-25 DIAGNOSIS — M300 Polyarteritis nodosa: Secondary | ICD-10-CM | POA: Diagnosis not present

## 2017-05-31 DIAGNOSIS — J328 Other chronic sinusitis: Secondary | ICD-10-CM | POA: Diagnosis not present

## 2017-05-31 DIAGNOSIS — M3130 Wegener's granulomatosis without renal involvement: Secondary | ICD-10-CM | POA: Diagnosis not present

## 2017-06-12 ENCOUNTER — Ambulatory Visit (INDEPENDENT_AMBULATORY_CARE_PROVIDER_SITE_OTHER): Payer: Medicare Other | Admitting: Internal Medicine

## 2017-06-12 ENCOUNTER — Encounter: Payer: Self-pay | Admitting: Internal Medicine

## 2017-06-12 VITALS — BP 124/74 | HR 71 | Ht 70.0 in | Wt 178.0 lb

## 2017-06-12 DIAGNOSIS — I720 Aneurysm of carotid artery: Secondary | ICD-10-CM | POA: Diagnosis not present

## 2017-06-12 DIAGNOSIS — I483 Typical atrial flutter: Secondary | ICD-10-CM

## 2017-06-12 MED ORDER — APIXABAN 5 MG PO TABS: 5.0000 mg | ORAL_TABLET | Freq: Two times a day (BID) | ORAL | 3 refills | Status: DC

## 2017-06-12 MED ORDER — AMIODARONE HCL 200 MG PO TABS: 200.0000 mg | ORAL_TABLET | Freq: Every day | ORAL | 3 refills | Status: DC

## 2017-06-12 NOTE — Patient Instructions (Addendum)
Medication Instructions:  Your physician recommends that you continue on your current medications as directed. Please refer to the Current Medication list given to you today.  Labwork: None ordered.  Testing/Procedures: None ordered.  Follow-Up: Your physician wants you to follow-up in: 4 months with Dr. [name].    Any Other Special Instructions Will Be Listed Below (If Applicable).  You are being referred to Dr. Trula Slade for right carotid aneurysm.  If you need a refill on your cardiac medications before your next appointment, please call your pharmacy.

## 2017-06-12 NOTE — Progress Notes (Signed)
PCP: Haywood Pao, MD   Primary EP: Dr [name]  Chad Morrison is a 84 y.o. male who presents today for routine electrophysiology followup.  Since last being seen in our clinic, the patient reports doing very well.  He was initially planned to have atrial flutter ablation.  He cancelled this with plans to return in the summer to reconsider.  No further arrhythmias.  Does not like being on amiodarone or eliquis.  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  He is on methotrexate and prednisone for this and is followed by rheumatology at [facility].  He has had multiple hospitalizations since I saw him last. Today, he denies symptoms of palpitations, chest pain, shortness of breath,  lower extremity edema, dizziness, presyncope, or syncope.  The patient is otherwise without complaint today.   Past Medical History:  Diagnosis Date  . Anxiety   . Asthma   . Asthmatic bronchitis   . Atrial flutter (HCC)    chads2vasc score is 3  . Ehrlichiosis 4709  . GERD (gastroesophageal reflux disease)   . HTN (hypertension)   . Hyperlipidemia   . Paroxysmal atrial fibrillation (HCC)   . Prostate cancer ([place])   . Seasonal allergies    Past Surgical History:  Procedure Laterality Date  . BASAL CELL CARCINOMA EXCISION  2013   EYELID  . HERNIA REPAIR Bilateral 2000, 2002  . KNEE ARTHROSCOPY  2000  . prostectomy  1995   RADICAL  . TONSILLECTOMY  1940    ROS- all systems are reviewed and negatives except as per HPI above  Current Outpatient Medications  Medication Sig Dispense Refill  . albuterol (PROVENTIL HFA;VENTOLIN HFA) 108 (90 Base) MCG/ACT inhaler Inhale 1-2 puffs every 6 (six) hours as needed into the lungs for wheezing or shortness of breath.    . ALPRAZolam (XANAX) 0.25 MG tablet Take 0.25 mg at bedtime as needed by mouth for anxiety.    Marland Kitchen amiodarone (PACERONE) 200 MG tablet Take 1 tablet (200 mg total) by mouth daily. 90 tablet 3  . apixaban (ELIQUIS) 5 MG TABS  tablet Take 1 tablet (5 mg total) by mouth 2 (two) times daily. 180 tablet 3  . budesonide-formoterol (SYMBICORT) 160-4.5 MCG/ACT inhaler Inhale 2 puffs 2 (two) times daily into the lungs.    . calcium acetate (PHOSLO) 667 MG capsule Take 1,334 mg daily by mouth.    . folic acid (FOLVITE) 1 MG tablet Take 1 mg by mouth daily.    Marland Kitchen levocetirizine (XYZAL) 5 MG tablet Take 5 mg every evening by mouth.    . methotrexate (RHEUMATREX) 2.5 MG tablet Take 12.5 mg by mouth once a week. Caution:Chemotherapy. Protect from light.    . montelukast (SINGULAIR) 10 MG tablet Take 10 mg at bedtime by mouth.    . Multiple Vitamin (MULTIVITAMIN WITH MINERALS) TABS tablet Take 1 tablet daily by mouth.    . Multiple Vitamins-Minerals (OCUVITE PRESERVISION PO) Take 1 tablet 2 (two) times daily by mouth.    Marland Kitchen omeprazole (PRILOSEC) 40 MG capsule Take 40 mg daily by mouth.    . predniSONE (DELTASONE) 10 MG tablet 40mg X3 days, 30mg  X3 days, 20mg  X3 days, 10mg X3 days, then stop. (Patient taking differently: 50mg  X2 wks, 40mg X2 wk, 30mg  X2wk, 20mg  X2 wk, 10mg X2 wk, then stop.) 30 tablet 0  . simvastatin (ZOCOR) 10 MG tablet Take 10 mg daily by mouth.    . triamcinolone (NASACORT ALLERGY 24HR) 55 MCG/ACT AERO nasal inhaler Place 2 sprays 2 (two)  times daily into the nose.     No current facility-administered medications for this visit.     Physical Exam: Vitals:   06/12/17 1552  BP: 124/74  Pulse: 71  Weight: 178 lb (80.7 kg)  Height: 5\' 10"  (1.778 m)    GEN- The patient is well appearing, alert and oriented x 3 today.   Head- normocephalic, atraumatic Eyes-  R eye is swollen Ears- hearing intact Oropharynx- clear Lungs- Clear to ausculation bilaterally, normal work of breathing Heart- Regular rate and rhythm, no murmurs, rubs or gallops, PMI not laterally displaced GI- soft, NT, ND, + BS Extremities- no clubbing, cyanosis, or edema  EKG tracing ordered today is personally reviewed and shows sinus rhythm,  RBBB  Assessment and Plan:  1. Typical atrial flutter Doing well with amiodarone.   Now in sinus chads2vasc score is 3.   Given recently diagnosed granulomatosis with polyangiitis, he would prefer to defer atrial flutter ablation and continue amiodarone at this time. No changes are made today.   Though there is suggestion of atrial fibrillation, this has not been well documented and the patient does not recall.  We will assess with EPS eventually.  Consider implantable loop recorder after ablation with plans to stop anticoagulation if no afib is detected.  Return to see me in 4 months  Thompson Grayer MD, [facility] 06/12/2017 4:28 PM

## 2017-06-13 DIAGNOSIS — Z79899 Other long term (current) drug therapy: Secondary | ICD-10-CM | POA: Diagnosis not present

## 2017-06-13 DIAGNOSIS — H0510 Unspecified chronic inflammatory disorders of orbit: Secondary | ICD-10-CM | POA: Diagnosis not present

## 2017-06-13 DIAGNOSIS — M810 Age-related osteoporosis without current pathological fracture: Secondary | ICD-10-CM | POA: Diagnosis not present

## 2017-06-13 DIAGNOSIS — M7989 Other specified soft tissue disorders: Secondary | ICD-10-CM | POA: Diagnosis not present

## 2017-06-15 DIAGNOSIS — M3130 Wegener's granulomatosis without renal involvement: Secondary | ICD-10-CM | POA: Diagnosis not present

## 2017-06-15 DIAGNOSIS — J324 Chronic pansinusitis: Secondary | ICD-10-CM | POA: Diagnosis not present

## 2017-06-20 ENCOUNTER — Telehealth: Payer: Self-pay | Admitting: Pulmonary Disease

## 2017-06-20 MED ORDER — ALBUTEROL SULFATE HFA 108 (90 BASE) MCG/ACT IN AERS: 1.0000 | INHALATION_SPRAY | Freq: Four times a day (QID) | RESPIRATORY_TRACT | 3 refills | Status: DC | PRN

## 2017-06-20 NOTE — Telephone Encounter (Signed)
Rx sent for 90 day supply to [facility] with 3 refills as requested. I called pt's wife to let her know but no answer and no VM to leave message.

## 2017-06-21 NOTE — Telephone Encounter (Signed)
Spoke with pt's wife and advised rx sent to pharmacy. Nothing further is needed.

## 2017-06-28 ENCOUNTER — Ambulatory Visit: Payer: Medicare Other | Admitting: Internal Medicine

## 2017-06-28 DIAGNOSIS — R82998 Other abnormal findings in urine: Secondary | ICD-10-CM | POA: Diagnosis not present

## 2017-06-28 DIAGNOSIS — Z125 Encounter for screening for malignant neoplasm of prostate: Secondary | ICD-10-CM | POA: Diagnosis not present

## 2017-06-28 DIAGNOSIS — R946 Abnormal results of thyroid function studies: Secondary | ICD-10-CM | POA: Diagnosis not present

## 2017-06-28 DIAGNOSIS — E7800 Pure hypercholesterolemia, unspecified: Secondary | ICD-10-CM | POA: Diagnosis not present

## 2017-07-03 DIAGNOSIS — M7989 Other specified soft tissue disorders: Secondary | ICD-10-CM | POA: Diagnosis not present

## 2017-07-05 DIAGNOSIS — Z0000 Encounter for general adult medical examination without abnormal findings: Secondary | ICD-10-CM | POA: Diagnosis not present

## 2017-07-05 DIAGNOSIS — Z6826 Body mass index (BMI) 26.0-26.9, adult: Secondary | ICD-10-CM | POA: Diagnosis not present

## 2017-07-05 DIAGNOSIS — J4540 Moderate persistent asthma, uncomplicated: Secondary | ICD-10-CM | POA: Diagnosis not present

## 2017-07-05 DIAGNOSIS — Z8546 Personal history of malignant neoplasm of prostate: Secondary | ICD-10-CM | POA: Diagnosis not present

## 2017-07-05 DIAGNOSIS — R946 Abnormal results of thyroid function studies: Secondary | ICD-10-CM | POA: Diagnosis not present

## 2017-07-05 DIAGNOSIS — Z1389 Encounter for screening for other disorder: Secondary | ICD-10-CM | POA: Diagnosis not present

## 2017-07-05 DIAGNOSIS — I4892 Unspecified atrial flutter: Secondary | ICD-10-CM | POA: Diagnosis not present

## 2017-07-05 DIAGNOSIS — J302 Other seasonal allergic rhinitis: Secondary | ICD-10-CM | POA: Diagnosis not present

## 2017-07-05 DIAGNOSIS — Z7901 Long term (current) use of anticoagulants: Secondary | ICD-10-CM | POA: Diagnosis not present

## 2017-07-05 DIAGNOSIS — M3130 Wegener's granulomatosis without renal involvement: Secondary | ICD-10-CM | POA: Diagnosis not present

## 2017-07-05 DIAGNOSIS — E7800 Pure hypercholesterolemia, unspecified: Secondary | ICD-10-CM | POA: Diagnosis not present

## 2017-07-06 ENCOUNTER — Other Ambulatory Visit: Payer: Self-pay

## 2017-07-06 DIAGNOSIS — H10501 Unspecified blepharoconjunctivitis, right eye: Secondary | ICD-10-CM | POA: Diagnosis not present

## 2017-07-06 DIAGNOSIS — I6521 Occlusion and stenosis of right carotid artery: Secondary | ICD-10-CM

## 2017-07-12 DIAGNOSIS — H40053 Ocular hypertension, bilateral: Secondary | ICD-10-CM | POA: Diagnosis not present

## 2017-07-12 DIAGNOSIS — H52203 Unspecified astigmatism, bilateral: Secondary | ICD-10-CM | POA: Diagnosis not present

## 2017-07-12 DIAGNOSIS — H0510 Unspecified chronic inflammatory disorders of orbit: Secondary | ICD-10-CM | POA: Diagnosis not present

## 2017-07-12 DIAGNOSIS — H532 Diplopia: Secondary | ICD-10-CM | POA: Diagnosis not present

## 2017-07-13 DIAGNOSIS — M300 Polyarteritis nodosa: Secondary | ICD-10-CM | POA: Diagnosis not present

## 2017-07-13 DIAGNOSIS — H05111 Granuloma of right orbit: Secondary | ICD-10-CM | POA: Diagnosis not present

## 2017-07-14 DIAGNOSIS — Z7952 Long term (current) use of systemic steroids: Secondary | ICD-10-CM | POA: Diagnosis not present

## 2017-07-14 DIAGNOSIS — Z9889 Other specified postprocedural states: Secondary | ICD-10-CM | POA: Diagnosis not present

## 2017-07-14 DIAGNOSIS — M3130 Wegener's granulomatosis without renal involvement: Secondary | ICD-10-CM | POA: Diagnosis not present

## 2017-07-14 DIAGNOSIS — M300 Polyarteritis nodosa: Secondary | ICD-10-CM | POA: Diagnosis not present

## 2017-07-14 DIAGNOSIS — J324 Chronic pansinusitis: Secondary | ICD-10-CM | POA: Diagnosis not present

## 2017-07-14 DIAGNOSIS — L928 Other granulomatous disorders of the skin and subcutaneous tissue: Secondary | ICD-10-CM | POA: Diagnosis not present

## 2017-07-14 DIAGNOSIS — Z79899 Other long term (current) drug therapy: Secondary | ICD-10-CM | POA: Diagnosis not present

## 2017-07-19 DIAGNOSIS — M3130 Wegener's granulomatosis without renal involvement: Secondary | ICD-10-CM | POA: Diagnosis not present

## 2017-07-19 DIAGNOSIS — J324 Chronic pansinusitis: Secondary | ICD-10-CM | POA: Diagnosis not present

## 2017-07-19 DIAGNOSIS — H05111 Granuloma of right orbit: Secondary | ICD-10-CM | POA: Diagnosis not present

## 2017-07-19 DIAGNOSIS — H0510 Unspecified chronic inflammatory disorders of orbit: Secondary | ICD-10-CM | POA: Diagnosis not present

## 2017-07-19 DIAGNOSIS — Z9889 Other specified postprocedural states: Secondary | ICD-10-CM | POA: Diagnosis not present

## 2017-07-19 DIAGNOSIS — J3489 Other specified disorders of nose and nasal sinuses: Secondary | ICD-10-CM | POA: Diagnosis not present

## 2017-07-19 DIAGNOSIS — Z880 Allergy status to penicillin: Secondary | ICD-10-CM | POA: Diagnosis not present

## 2017-07-19 DIAGNOSIS — Z882 Allergy status to sulfonamides status: Secondary | ICD-10-CM | POA: Diagnosis not present

## 2017-07-21 ENCOUNTER — Ambulatory Visit (INDEPENDENT_AMBULATORY_CARE_PROVIDER_SITE_OTHER): Payer: Medicare Other | Admitting: Pulmonary Disease

## 2017-07-21 ENCOUNTER — Telehealth: Payer: Self-pay | Admitting: Pulmonary Disease

## 2017-07-21 ENCOUNTER — Encounter: Payer: Self-pay | Admitting: Pulmonary Disease

## 2017-07-21 ENCOUNTER — Ambulatory Visit (INDEPENDENT_AMBULATORY_CARE_PROVIDER_SITE_OTHER)
Admission: RE | Admit: 2017-07-21 | Discharge: 2017-07-21 | Disposition: A | Discharge status: Home / Self Care | Payer: Medicare Other | Source: Ambulatory Visit | Attending: Pulmonary Disease | Admitting: Pulmonary Disease

## 2017-07-21 VITALS — BP 128/80 | HR 102 | Temp 97.6°F | Ht 70.0 in | Wt 174.6 lb

## 2017-07-21 DIAGNOSIS — R0602 Shortness of breath: Secondary | ICD-10-CM | POA: Diagnosis not present

## 2017-07-21 DIAGNOSIS — I6521 Occlusion and stenosis of right carotid artery: Secondary | ICD-10-CM | POA: Diagnosis not present

## 2017-07-21 DIAGNOSIS — R0609 Other forms of dyspnea: Secondary | ICD-10-CM | POA: Diagnosis not present

## 2017-07-21 DIAGNOSIS — R0600 Dyspnea, unspecified: Secondary | ICD-10-CM

## 2017-07-21 DIAGNOSIS — R05 Cough: Secondary | ICD-10-CM | POA: Diagnosis not present

## 2017-07-21 MED ORDER — TIOTROPIUM BROMIDE MONOHYDRATE 2.5 MCG/ACT IN AERS: 2.0000 | INHALATION_SPRAY | Freq: Every day | RESPIRATORY_TRACT | 5 refills | Status: DC

## 2017-07-21 NOTE — Telephone Encounter (Signed)
Called and spoke to patient. Patient stated that he saw VS but that he is supposed to follow up with BQ in 2 weeks. I saw that VS stated follow up with NP. Reviewed with patient BQ's next available appointment and patient was not happy with that being so far out. Scheduled patient with NP June 10th at 11:45. Nothing further is needed at this time.

## 2017-07-21 NOTE — Telephone Encounter (Signed)
Dg Chest 2 View  Result Date: 07/21/2017 CLINICAL DATA:  Congestion, shortness of breath for 2-3 weeks, hypertension, asthma, atrial fibrillation, dyspnea, cough EXAM: CHEST - 2 VIEW COMPARISON:  None FINDINGS: Normal heart size, mediastinal contours, and pulmonary vascularity. Emphysematous changes with mild central peribronchial thickening consistent with COPD. Subsegmental atelectasis versus scarring at RIGHT base. No definite acute infiltrate, pleural effusion or pneumothorax. Bones demineralized. IMPRESSION: COPD changes with atelectasis versus scarring at RIGHT base. Electronically Signed   By: Lavonia Dana M.D.   On: 07/21/2017 16:29    He reports having necrotic pneumonia before and thinks his scarring is from that.  Will try adding spiriva to his inhaler regimen and reassess at [facility] in June.

## 2017-07-21 NOTE — Telephone Encounter (Signed)
Offered appoint w one of the NP's wanted to see DMc only, please advise.Hillery Hunter

## 2017-07-21 NOTE — Progress Notes (Signed)
Chief Complaint  Patient presents with  . Acute Visit    increased SOB with chest congestion for last several weeks. Increased throat clearing. Started taking benadryl to sleep during the night since symptoms started.     HPI: 84 yo male former smoker with ANCA positive small vessel vasculitis, COPD with asthma, allergic rhinitis.  He follows Chad Morrison for COPD/asthma and allergies.  He is followed by rheumatology and ENT at [facility] for small vessel vasculitis.  He has been on prednisone and MTX.  Was being tapered off prednisone, but then sinuses flared up.  Prednisone dose back up and sinuses better.  He feels his breathing has gotten worse over the past several weeks.  He thinks this coincides with turning his air conditioner on with warmer weather.  He notices cough and chest congestion at night and in the morning.  As the day goes on this seems to get better.  He uses albuterol sporadically and this seems to help some.  He notices getting winded more quickly with activities that he used to be able to do w/o difficulty before.  He is not having chest pain, palpitations, fever, hemoptysis, or skin rash.  He has chronic leg swelling, but this was worse when he was on higher doses of prednisone.   He  has a past medical history of Anxiety, Asthma, Asthmatic bronchitis, Atrial flutter ([place]), Ehrlichiosis (6294), GERD (gastroesophageal reflux disease), Granulomatosis with polyangiitis ([place]), HTN (hypertension), Hyperlipidemia, Paroxysmal atrial fibrillation ([place]), Prostate cancer ([place]), and Seasonal allergies.   He  has a past surgical history that includes Tonsillectomy (1940); prostectomy (1995); Hernia repair (Bilateral, 2000, 2002); Excision basal cell carcinoma (2013); and Knee arthroscopy (2000).   His family history includes Allergies in his mother; Asthma (age of onset: 24) in his mother; CVA (age of onset: 32) in his father; Diabetes Mellitus I in his son; Heart Problems in his father;  Migraines in his son; Other in his father; Other (age of onset: 87) in his brother; Ulcers in his mother.   He  reports that he quit smoking about 56 years ago. His smoking use included cigarettes. He has a 2.00 pack-year smoking history. He has never used smokeless tobacco. He reports that he drinks alcohol. He reports that he does not use drugs.   Allergies as of 07/21/2017      Reactions   Latex Hives   Penicillins Hives   Sulfa Antibiotics    unknown      Medication List        Accurate as of 07/21/17 12:55 PM. Always use your most recent med list.          albuterol 108 (90 Base) MCG/ACT inhaler Commonly known as:  PROVENTIL HFA;VENTOLIN HFA Inhale 1-2 puffs into the lungs every 6 (six) hours as needed for wheezing or shortness of breath.   ALPRAZolam 0.25 MG tablet Commonly known as:  XANAX Take 0.25 mg at bedtime as needed by mouth for anxiety.   amiodarone 200 MG tablet Commonly known as:  PACERONE Take 1 tablet (200 mg total) by mouth daily.   apixaban 5 MG Tabs tablet Commonly known as:  ELIQUIS Take 1 tablet (5 mg total) by mouth 2 (two) times daily.   budesonide-formoterol 160-4.5 MCG/ACT inhaler Commonly known as:  SYMBICORT Inhale 2 puffs 2 (two) times daily into the lungs.   calcium acetate 667 MG capsule Commonly known as:  PHOSLO Take 1,334 mg daily by mouth.   folic acid 1 MG tablet Commonly  known as:  FOLVITE Take 1 mg by mouth daily.   levocetirizine 5 MG tablet Commonly known as:  XYZAL Take 5 mg every evening by mouth.   levothyroxine 75 MCG tablet Commonly known as:  SYNTHROID, LEVOTHROID Take 75 mcg by mouth daily before breakfast.   methotrexate 2.5 MG tablet Commonly known as:  RHEUMATREX Take 12.5 mg by mouth once a week. Caution:Chemotherapy. Protect from light.   montelukast 10 MG tablet Commonly known as:  SINGULAIR Take 10 mg at bedtime by mouth.   multivitamin with minerals Tabs tablet Take 1 tablet daily by mouth.     NASACORT ALLERGY 24HR 55 MCG/ACT Aero nasal inhaler Generic drug:  triamcinolone Place 2 sprays 2 (two) times daily into the nose.   OCUVITE PRESERVISION PO Take 1 tablet 2 (two) times daily by mouth.   omeprazole 40 MG capsule Commonly known as:  PRILOSEC Take 40 mg daily by mouth.   predniSONE 10 MG tablet Commonly known as:  DELTASONE 40mg X3 days, 30mg  X3 days, 20mg  X3 days, 10mg X3 days, then stop.   simvastatin 10 MG tablet Commonly known as:  ZOCOR Take 10 mg daily by mouth.       Vital signs: BP 128/80 (BP Location: Left Arm, Cuff Size: Normal)   Pulse (!) 102   Temp 97.6 F (36.4 C) (Oral)   Ht 5\' 10"  (1.778 m)   Wt 174 lb 9.6 oz (79.2 kg)   SpO2 93%   BMI 25.05 kg/m   Physical exam:  General - pleasant Eyes - pupils reactive ENT - no sinus tenderness, mild crusting around nares, no drainage, no oral exudate, no LAN Cardiac - irregular, no murmur Chest - no wheeze, rales Abd - soft, non tender Ext - 1+ edema lower legs b/l Skin - no rashes Neuro - normal strength Psych - normal mood  Labs: CMET 06/13/17 >> Na 139, K 4.3, CO2 25, Creatinine 1.2, AST 21, ALT 29, Ca 9.1 CBC 06/13/17 >> WBC 6.3, Hb 10.9, PLT 238  Studies: Echo 09/08/16 >> mild LVH, EF 60 to 65% PFT 05/24/17 >> FEV1 2.44 (88%), FEV1% 65, TLC 7.41 (104%), DLCO 70% Doppler legs 07/03/17 >> no DVT; Baker's cyst Rt popliteal fossa  Discussion: He has subacute onset of worsening dyspnea, and cough.  He has a complex history of ANCA positive vasculitis, COPD with asthma, and allergies.  He is on several medications that could cause lung toxicity.  He had recent increase in prednisone dose for worsening sinus symptoms from vasculitis.  His sinus symptoms improved, but no difference with his cough and shortness of breath.  He does seem to have a nocturnal component to his symptoms.  I am less concerned about an infectious process.  Plan: - Chest xray today - Continue symbicort, singulair, xyzal,  nasacort - Prn proventil >> discussed role of rescue inhaler and when to use it - prednisone and methotrexate per rheumatology at [facility] - if pulmonary assessment is unrevealing, then he might need f/u cardiology to assess status of a fib   Patient Instructions  Chest xray today  Follow up with Chad Morrison in 2 weeks

## 2017-07-21 NOTE — Patient Instructions (Signed)
Chest xray today  Follow up with Dr. Lake Bells in 2 weeks

## 2017-07-25 ENCOUNTER — Telehealth: Payer: Self-pay | Admitting: Pulmonary Disease

## 2017-07-25 NOTE — Telephone Encounter (Signed)
  Spoke with pt and advised the Spiriva needs to be added to his regimen. Pt understood and nothing further is needed.   Chesley Mires, MD     07/21/17 5:21 PM  Note  Expand All Collapse All    Imaging Results (Last 48 hours)  Dg Chest 2 View  Result Date: 07/21/2017 CLINICAL DATA:  Congestion, shortness of breath for 2-3 weeks, hypertension, asthma, atrial fibrillation, dyspnea, cough EXAM: CHEST - 2 VIEW COMPARISON:  None FINDINGS: Normal heart size, mediastinal contours, and pulmonary vascularity. Emphysematous changes with mild central peribronchial thickening consistent with COPD. Subsegmental atelectasis versus scarring at RIGHT base. No definite acute infiltrate, pleural effusion or pneumothorax. Bones demineralized. IMPRESSION: COPD changes with atelectasis versus scarring at RIGHT base. Electronically Signed   By: Lavonia Dana M.D.   On: 07/21/2017 16:29     He reports having necrotic pneumonia before and thinks his scarring is from that.  Will try adding spiriva to his inhaler regimen and reassess

## 2017-07-26 DIAGNOSIS — H40053 Ocular hypertension, bilateral: Secondary | ICD-10-CM | POA: Diagnosis not present

## 2017-07-26 DIAGNOSIS — H40043 Steroid responder, bilateral: Secondary | ICD-10-CM | POA: Diagnosis not present

## 2017-07-26 DIAGNOSIS — H25813 Combined forms of age-related cataract, bilateral: Secondary | ICD-10-CM | POA: Diagnosis not present

## 2017-08-04 DIAGNOSIS — Z79899 Other long term (current) drug therapy: Secondary | ICD-10-CM | POA: Diagnosis not present

## 2017-08-04 DIAGNOSIS — H40053 Ocular hypertension, bilateral: Secondary | ICD-10-CM | POA: Diagnosis not present

## 2017-08-04 DIAGNOSIS — M300 Polyarteritis nodosa: Secondary | ICD-10-CM | POA: Diagnosis not present

## 2017-08-04 DIAGNOSIS — H0510 Unspecified chronic inflammatory disorders of orbit: Secondary | ICD-10-CM | POA: Diagnosis not present

## 2017-08-04 DIAGNOSIS — H25813 Combined forms of age-related cataract, bilateral: Secondary | ICD-10-CM | POA: Diagnosis not present

## 2017-08-07 ENCOUNTER — Ambulatory Visit (INDEPENDENT_AMBULATORY_CARE_PROVIDER_SITE_OTHER): Payer: Medicare Other | Admitting: Adult Health

## 2017-08-07 ENCOUNTER — Encounter: Payer: Self-pay | Admitting: Adult Health

## 2017-08-07 DIAGNOSIS — J310 Chronic rhinitis: Secondary | ICD-10-CM

## 2017-08-07 DIAGNOSIS — I6521 Occlusion and stenosis of right carotid artery: Secondary | ICD-10-CM | POA: Diagnosis not present

## 2017-08-07 DIAGNOSIS — J449 Chronic obstructive pulmonary disease, unspecified: Secondary | ICD-10-CM | POA: Diagnosis not present

## 2017-08-07 NOTE — Progress Notes (Signed)
@Patient  ID: Chad Morrison, male    DOB: March 13, 1932, 84 y.o.   MRN: [id number]  Chief Complaint  Patient presents with  . Follow-up    Referring provider: Tisovec, Fransico Him, MD  HPI: 84 year old male former smoker followed for COPD with asthma and allergic rhinitis.  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  Patient was seen last visit with a flare of his breathing.  Chest x-ray was done showing COPD changes with no acute process.  He was recommend to start on Spiriva.  Patient says shortly after he was seen last visit he got a new air filtration system in his house which has helped his breathing quite a bit.  He did not start Spiriva because he feels that his breathing is much better.  Wants to continue on his current regimen and not make any changes at the present time.  He denies any flare of cough or wheezing.   Allergies  Allergen Reactions  . Latex Hives  . Penicillins Hives  . Sulfa Antibiotics     unknown    Immunization History  Administered Date(s) Administered  . Influenza, High Dose Seasonal PF 12/27/2016  . Pneumococcal Conjugate-13 01/04/2016  . Pneumococcal Polysaccharide-23 01/03/2014  . Pneumococcal-Unspecified 10/31/2016    Past Medical History:  Diagnosis Date  . Anxiety   . Asthma   . Asthmatic bronchitis   . Atrial flutter (HCC)    chads2vasc score is 3  . Ehrlichiosis 0160  . GERD (gastroesophageal reflux disease)   . Granulomatosis with polyangiitis ([place])   . HTN (hypertension)   . Hyperlipidemia   . Paroxysmal atrial fibrillation (HCC)   . Prostate cancer ([place])   . Seasonal allergies     Tobacco History: Social  History   Tobacco Use  Smoking Status Former Smoker  . Packs/day: 0.50  . Years: 4.00  . Pack years: 2.00  . Types: Cigarettes  . Last attempt to quit: 02/28/1961  . Years since quitting: 56.4  Smokeless Tobacco Never Used   Counseling given: Not Answered   Outpatient Encounter Medications as of 08/07/2017  Medication Sig  . albuterol (PROVENTIL HFA;VENTOLIN HFA) 108 (90 Base) MCG/ACT inhaler Inhale 1-2 puffs into the lungs every 6 (six) hours as needed for wheezing or shortness of breath.  . ALPRAZolam (XANAX) 0.25 MG tablet Take 0.25 mg at bedtime as needed by mouth for anxiety.  Marland Kitchen amiodarone (PACERONE) 200 MG tablet Take 1 tablet (200 mg total) by mouth daily.  Marland Kitchen apixaban (ELIQUIS) 5 MG TABS tablet Take 1 tablet (5 mg total) by mouth 2 (two) times daily.  . bimatoprost (LUMIGAN) 0.03 % ophthalmic solution Place 1 drop into the right eye at bedtime.  . brimonidine (ALPHAGAN P) 0.1 % SOLN Place 1 drop into the right eye 2 (two) times daily.  . budesonide-formoterol (SYMBICORT) 160-4.5 MCG/ACT inhaler Inhale 2 puffs 2 (two) times daily into the lungs.  . calcium acetate (PHOSLO) 667 MG capsule Take 1,334 mg daily by mouth.  . folic acid (FOLVITE) 1 MG tablet Take 1 mg by mouth daily.  Marland Kitchen levocetirizine (XYZAL) 5 MG tablet Take 5 mg every evening by mouth.  Marland Kitchen  levothyroxine (SYNTHROID, LEVOTHROID) 75 MCG tablet Take 75 mcg by mouth daily before breakfast.  . methotrexate (RHEUMATREX) 2.5 MG tablet Take 20 mg by mouth once a week. Caution:Chemotherapy. Protect from light.   . montelukast (SINGULAIR) 10 MG tablet Take 10 mg at bedtime by mouth.  . Multiple Vitamin (MULTIVITAMIN WITH MINERALS) TABS tablet Take 1 tablet daily by mouth.  . Multiple Vitamins-Minerals (OCUVITE PRESERVISION PO) Take 1 tablet 2 (two) times daily by mouth.  Marland Kitchen omeprazole (PRILOSEC) 40 MG capsule Take 40 mg daily by mouth.  . predniSONE (DELTASONE) 10 MG tablet 40mg X3 days, 30mg  X3 days, 20mg  X3 days, 10mg X3 days,  then stop. (Patient taking differently: Take 10 mg by mouth daily. Take 4 tablets (40mg ) daily)  . simvastatin (ZOCOR) 10 MG tablet Take 10 mg daily by mouth.  . triamcinolone (NASACORT ALLERGY 24HR) 55 MCG/ACT AERO nasal inhaler Place 2 sprays 2 (two) times daily into the nose.  . Tiotropium Bromide Monohydrate (SPIRIVA RESPIMAT) 2.5 MCG/ACT AERS Inhale 2 puffs into the lungs daily. (Patient not taking: Reported on 08/07/2017)   No facility-administered encounter medications on file as of 08/07/2017.      Review of Systems  Constitutional:   No  weight loss, night sweats,  Fevers, chills, + fatigue, or  lassitude.  HEENT:   No headaches,  Difficulty swallowing,  Tooth/dental problems, or  Sore throat,                No sneezing, itching, ear ache,  +nasal congestion, post nasal drip,   CV:  No chest pain,  Orthopnea, PND, swelling in lower extremities, anasarca, dizziness, palpitations, syncope.   GI  No heartburn, indigestion, abdominal pain, nausea, vomiting, diarrhea, change in bowel habits, loss of appetite, bloody stools.   Resp:    No chest wall deformity  Skin: no rash or lesions.  GU: no dysuria, change in color of urine, no urgency or frequency.  No flank pain, no hematuria   MS:  No joint pain or swelling.  No decreased range of motion.  No back pain.    Physical Exam  BP 140/76 (BP Location: Left Arm, Cuff Size: Normal)   Pulse 71   Ht 5\' 10"  (1.778 m)   Wt 176 lb 9.6 oz (80.1 kg)   SpO2 95%   BMI 25.34 kg/m   GEN: A/Ox3; pleasant , NAD, frail and elderly   HEENT:  [place]/AT,  EACs-clear, TMs-wnl, NOSE-clear, THROAT-clear, no lesions, no postnasal drip or exudate noted.   NECK:  Supple w/ fair ROM; no JVD; normal carotid impulses w/o bruits; no thyromegaly or nodules palpated; no lymphadenopathy.    RESP decreased breath sounds in the bases no accessory muscle use, no dullness to percussion  CARD:  RRR, no m/r/g, no peripheral edema, pulses intact, no cyanosis  or clubbing.  GI:   Soft & nt; nml bowel sounds; no organomegaly or masses detected.   Musco: Warm bil, no deformities or joint swelling noted.   Neuro: alert, no focal deficits noted.    Skin: Warm, no lesions or rashes    Lab Results:  CBC No results found for: WBC, RBC, HGB, HCT, PLT, MCV, MCH, MCHC, RDW, LYMPHSABS, MONOABS, EOSABS, BASOSABS  BMET No results found for: NA, K, CL, CO2, GLUCOSE, BUN, CREATININE, CALCIUM, GFRNONAA, GFRAA  BNP No results found for: BNP  ProBNP No results found for: PROBNP  Imaging: Dg Chest 2 View  Result Date: 07/21/2017 CLINICAL DATA:  Congestion, shortness of breath for 2-3 weeks,  hypertension, asthma, atrial fibrillation, dyspnea, cough EXAM: CHEST - 2 VIEW COMPARISON:  None FINDINGS: Normal heart size, mediastinal contours, and pulmonary vascularity. Emphysematous changes with mild central peribronchial thickening consistent with COPD. Subsegmental atelectasis versus scarring at RIGHT base. No definite acute infiltrate, pleural effusion or pneumothorax. Bones demineralized. IMPRESSION: COPD changes with atelectasis versus scarring at RIGHT base. Electronically Signed   By: Lavonia Dana M.D.   On: 07/21/2017 16:29     Assessment & Plan:   No problem-specific Assessment & Plan notes found for this encounter.     Rexene Edison, NP 08/07/2017

## 2017-08-07 NOTE — Patient Instructions (Addendum)
Continue on Symbicort 2 puffs Twice daily  ,rinse after use.  Continue on Singulair daily  Continue on Xyzal daily  Continue on Nasacort AQ daily  Follow up with Dr. Lake Bells 2 months and As needed   Please contact office for sooner follow up if symptoms do not improve or worsen or seek emergency care

## 2017-08-08 DIAGNOSIS — J449 Chronic obstructive pulmonary disease, unspecified: Secondary | ICD-10-CM | POA: Insufficient documentation

## 2017-08-08 DIAGNOSIS — J310 Chronic rhinitis: Secondary | ICD-10-CM | POA: Insufficient documentation

## 2017-08-08 NOTE — Assessment & Plan Note (Signed)
Recent flare.  Chest x-ray without any acute changes.  Symptoms seem to have stabilized.  Patient believes this is secondary to home air filtration system.  For now patient seems to be doing okay.  And stable.  We will continue his current regimen.  Plan  Patient Instructions  Continue on Symbicort 2 puffs Twice daily  ,rinse after use.  Continue on Singulair daily  Continue on Xyzal daily  Continue on Nasacort AQ daily  Follow up with Dr. Lake Bells 2 months and As needed   Please contact office for sooner follow up if symptoms do not improve or worsen or seek emergency care

## 2017-08-08 NOTE — Assessment & Plan Note (Signed)
Control for triggers  Plan  Patient Instructions  Continue on Symbicort 2 puffs Twice daily  ,rinse after use.  Continue on Singulair daily  Continue on Xyzal daily  Continue on Nasacort AQ daily  Follow up with Dr. Lake Bells 2 months and As needed   Please contact office for sooner follow up if symptoms do not improve or worsen or seek emergency care

## 2017-08-15 DIAGNOSIS — T462X5A Adverse effect of other antidysrhythmic drugs, initial encounter: Secondary | ICD-10-CM | POA: Diagnosis not present

## 2017-08-15 DIAGNOSIS — E032 Hypothyroidism due to medicaments and other exogenous substances: Secondary | ICD-10-CM | POA: Diagnosis not present

## 2017-08-15 DIAGNOSIS — E039 Hypothyroidism, unspecified: Secondary | ICD-10-CM | POA: Diagnosis not present

## 2017-08-17 DIAGNOSIS — M3130 Wegener's granulomatosis without renal involvement: Secondary | ICD-10-CM | POA: Diagnosis not present

## 2017-08-17 DIAGNOSIS — L929 Granulomatous disorder of the skin and subcutaneous tissue, unspecified: Secondary | ICD-10-CM | POA: Diagnosis not present

## 2017-08-17 DIAGNOSIS — J324 Chronic pansinusitis: Secondary | ICD-10-CM | POA: Diagnosis not present

## 2017-08-17 DIAGNOSIS — M300 Polyarteritis nodosa: Secondary | ICD-10-CM | POA: Diagnosis not present

## 2017-08-17 DIAGNOSIS — Z9889 Other specified postprocedural states: Secondary | ICD-10-CM | POA: Diagnosis not present

## 2017-08-21 ENCOUNTER — Encounter (HOSPITAL_COMMUNITY): Payer: Medicare Other

## 2017-08-21 ENCOUNTER — Encounter: Payer: Medicare Other | Admitting: Surgery

## 2017-08-22 DIAGNOSIS — M300 Polyarteritis nodosa: Secondary | ICD-10-CM | POA: Diagnosis not present

## 2017-08-22 DIAGNOSIS — I4892 Unspecified atrial flutter: Secondary | ICD-10-CM | POA: Diagnosis not present

## 2017-08-22 DIAGNOSIS — J329 Chronic sinusitis, unspecified: Secondary | ICD-10-CM | POA: Diagnosis not present

## 2017-08-22 DIAGNOSIS — M810 Age-related osteoporosis without current pathological fracture: Secondary | ICD-10-CM | POA: Diagnosis not present

## 2017-08-22 DIAGNOSIS — H409 Unspecified glaucoma: Secondary | ICD-10-CM | POA: Diagnosis not present

## 2017-08-22 DIAGNOSIS — H0510 Unspecified chronic inflammatory disorders of orbit: Secondary | ICD-10-CM | POA: Diagnosis not present

## 2017-08-22 DIAGNOSIS — Z13820 Encounter for screening for osteoporosis: Secondary | ICD-10-CM | POA: Diagnosis not present

## 2017-08-22 DIAGNOSIS — R600 Localized edema: Secondary | ICD-10-CM | POA: Diagnosis not present

## 2017-08-22 DIAGNOSIS — M8580 Other specified disorders of bone density and structure, unspecified site: Secondary | ICD-10-CM | POA: Diagnosis not present

## 2017-08-22 DIAGNOSIS — Z79899 Other long term (current) drug therapy: Secondary | ICD-10-CM | POA: Diagnosis not present

## 2017-08-22 DIAGNOSIS — Z7722 Contact with and (suspected) exposure to environmental tobacco smoke (acute) (chronic): Secondary | ICD-10-CM | POA: Diagnosis not present

## 2017-08-23 ENCOUNTER — Encounter (HOSPITAL_COMMUNITY): Payer: Medicare Other

## 2017-08-28 ENCOUNTER — Other Ambulatory Visit: Payer: Self-pay

## 2017-08-28 ENCOUNTER — Ambulatory Visit (HOSPITAL_COMMUNITY)
Admission: RE | Admit: 2017-08-28 | Discharge: 2017-08-28 | Disposition: A | Discharge status: Home / Self Care | Payer: Medicare Other | Source: Ambulatory Visit | Attending: Surgery | Admitting: Surgery

## 2017-08-28 ENCOUNTER — Ambulatory Visit (INDEPENDENT_AMBULATORY_CARE_PROVIDER_SITE_OTHER): Payer: Medicare Other | Admitting: Surgery

## 2017-08-28 ENCOUNTER — Encounter: Payer: Self-pay | Admitting: Surgery

## 2017-08-28 VITALS — BP 156/101 | HR 113 | Temp 97.0°F | Resp 18 | Ht 70.0 in | Wt 176.0 lb

## 2017-08-28 DIAGNOSIS — I6521 Occlusion and stenosis of right carotid artery: Secondary | ICD-10-CM | POA: Diagnosis not present

## 2017-08-28 DIAGNOSIS — I6523 Occlusion and stenosis of bilateral carotid arteries: Secondary | ICD-10-CM | POA: Diagnosis not present

## 2017-08-28 DIAGNOSIS — I671 Cerebral aneurysm, nonruptured: Secondary | ICD-10-CM | POA: Diagnosis not present

## 2017-08-28 NOTE — Progress Notes (Signed)
[facility]  Patient name: Chad Morrison MRN: [id number] DOB: January 17, 1933 Sex: male   REQUESTING PROVIDER:    Dr. Joylene Grapes   REASON FOR CONSULT:    Carotid aneurysm   HISTORY OF PRESENT ILLNESS:   Chad Morrison is a 84 y.o. male, who is referred today for evaluation of a carotid artery aneurysm that was detected on CT scan during a work-up for what turned out to be granulomatosis with polyangiitis.  The patient is now being treated with steroids and methotrexate.  His symptoms are improving.  He was found to have an aneurysm within the anterior communicating artery measuring 5 x 3 mm.  The patient suffers from atrial fibrillation for which he takes anticoagulation.  He is on a statin for hypercholesterolemia.  He is a former smoker.  He is medically managed for hypertension  PAST MEDICAL HISTORY    Past Medical History:  Diagnosis Date  . Anxiety   . Asthma   . Asthmatic bronchitis   . Atrial flutter (HCC)    chads2vasc score is 3  . Ehrlichiosis 4128  . GERD (gastroesophageal reflux disease)   . Granulomatosis with polyangiitis ([place])   . HTN (hypertension)   . Hyperlipidemia   . Paroxysmal atrial fibrillation (HCC)   . Prostate cancer ([place])   . Seasonal allergies      FAMILY HISTORY   Family History  Problem Relation Age of Onset  . Asthma Mother 3  . Allergies Mother   . Ulcers Mother   . CVA Father 28  . Heart Problems Father   . Other Father        NEUROLOGIC ISSUES   . Other Brother 11       BICYCLE ACCIDENT  . Migraines Son   . Diabetes Mellitus I Son     SOCIAL HISTORY:   Social History   Socioeconomic History  . Marital status: Married    Spouse name: [name]  . Number of children: 2  . Years of education: Not on file  . Highest education level: Not on file  Occupational History  . Occupation: RETIRED  Social Needs  . Financial resource strain: Not on file  . Food insecurity:     Worry: Not on file    Inability: Not on file  . Transportation needs:    Medical: Not on file    Non-medical: Not on file  Tobacco Use  . Smoking status: Former Smoker    Packs/day: 0.50    Years: 4.00    Pack years: 2.00    Types: Cigarettes    Last attempt to quit: 02/28/1961    Years since quitting: 56.5  . Smokeless tobacco: Never Used  Substance and Sexual Activity  . Alcohol use: Yes  . Drug use: No  . Sexual activity: Not on file  Lifestyle  . Physical activity:    Days per week: Not on file    Minutes per session: Not on file  . Stress: Not on file  Relationships  . Social connections:    Talks on phone: Not on file    Gets together: Not on file    Attends religious service: Not on file    Active member of club or organization: Not on file    Attends meetings of clubs or organizations: Not on file    Relationship status: Not on file  . Intimate partner violence:    Fear of current or ex partner: Not on file  Emotionally abused: Not on file    Physically abused: Not on file    Forced sexual activity: Not on file  Other Topics Concern  . Not on file  Social History Narrative   Pt recently moved to [place] from [place] to be near his son.   Retired.    ALLERGIES:    Allergies  Allergen Reactions  . Latex Hives  . Penicillins Hives  . Sulfa Antibiotics Other (See Comments)    unknown unknown  unknown     CURRENT MEDICATIONS:    Current Outpatient Medications  Medication Sig Dispense Refill  . albuterol (PROVENTIL HFA;VENTOLIN HFA) 108 (90 Base) MCG/ACT inhaler Inhale 1-2 puffs into the lungs every 6 (six) hours as needed for wheezing or shortness of breath. 3 Inhaler 3  . ALPRAZolam (XANAX) 0.25 MG tablet Take 0.25 mg at bedtime as needed by mouth for anxiety.    Marland Kitchen amiodarone (PACERONE) 200 MG tablet Take 1 tablet (200 mg total) by mouth daily. 90 tablet 3  . apixaban (ELIQUIS) 5 MG TABS tablet Take 1 tablet (5 mg total) by mouth 2 (two)  times daily. 180 tablet 3  . bimatoprost (LUMIGAN) 0.03 % ophthalmic solution Place 1 drop into the right eye at bedtime.    . brimonidine (ALPHAGAN P) 0.1 % SOLN Place 1 drop into the right eye 2 (two) times daily.    . budesonide-formoterol (SYMBICORT) 160-4.5 MCG/ACT inhaler Inhale 2 puffs 2 (two) times daily into the lungs.    . calcium acetate (PHOSLO) 667 MG capsule Take 1,334 mg daily by mouth.    . folic acid (FOLVITE) 1 MG tablet Take 1 mg by mouth daily.    Marland Kitchen levocetirizine (XYZAL) 5 MG tablet Take 5 mg every evening by mouth.    . levothyroxine (SYNTHROID, LEVOTHROID) 75 MCG tablet Take 75 mcg by mouth daily before breakfast.    . methotrexate (RHEUMATREX) 2.5 MG tablet Take 20 mg by mouth once a week. Caution:Chemotherapy. Protect from light.     . montelukast (SINGULAIR) 10 MG tablet Take 10 mg at bedtime by mouth.    . Multiple Vitamin (MULTIVITAMIN WITH MINERALS) TABS tablet Take 1 tablet daily by mouth.    . Multiple Vitamins-Minerals (OCUVITE PRESERVISION PO) Take 1 tablet 2 (two) times daily by mouth.    Marland Kitchen omeprazole (PRILOSEC) 40 MG capsule Take 40 mg daily by mouth.    . predniSONE (DELTASONE) 10 MG tablet 40mg X3 days, 30mg  X3 days, 20mg  X3 days, 10mg X3 days, then stop. (Patient taking differently: Take 10 mg by mouth daily. Take 4 tablets (40mg ) daily) 30 tablet 0  . simvastatin (ZOCOR) 10 MG tablet Take 10 mg daily by mouth.    . Tiotropium Bromide Monohydrate (SPIRIVA RESPIMAT) 2.5 MCG/ACT AERS Inhale 2 puffs into the lungs daily. 1 Inhaler 5  . triamcinolone (NASACORT ALLERGY 24HR) 55 MCG/ACT AERO nasal inhaler Place 2 sprays 2 (two) times daily into the nose.     No current facility-administered medications for this visit.     REVIEW OF SYSTEMS:   [X]  denotes positive finding, [ ]  denotes negative finding Cardiac  Comments:  Chest pain or chest pressure:    Shortness of breath upon exertion:    Short of breath when lying flat:    Irregular heart rhythm:         Vascular    Pain in calf, thigh, or hip brought on by ambulation:    Pain in feet at night that wakes you up from your sleep:  Blood clot in your veins:    Leg swelling:         Pulmonary    Oxygen at home:    Productive cough:     Wheezing:         Neurologic    Sudden weakness in arms or legs:     Sudden numbness in arms or legs:     Sudden onset of difficulty speaking or slurred speech:    Temporary loss of vision in one eye:     Problems with dizziness:         Gastrointestinal    Blood in stool:      Vomited blood:         Genitourinary    Burning when urinating:     Blood in urine:        Psychiatric    Major depression:         Hematologic    Bleeding problems:    Problems with blood clotting too easily:        Skin    Rashes or ulcers:        Constitutional    Fever or chills:     PHYSICAL EXAM:   Vitals:   08/28/17 1300 08/28/17 1304 08/28/17 1305  BP: (!) 168/105 (!) 142/89 (!) 156/101  Pulse: (!) 113 (!) 113 (!) 113  Resp: 18    Temp: (!) 97 F (36.1 C)    TempSrc: Oral    Weight: 176 lb (79.8 kg)    Height: 5\' 10"  (1.778 m)      GENERAL: The patient is a well-nourished male, in no acute distress. The vital signs are documented above. CARDIAC: There is a regular rate and rhythm.  VASCULAR: No carotid bruits.  Palpable pedal pulses. PULMONARY: Nonlabored respirations ABDOMEN: Soft and non-tender.  No pulsatile mass MUSCULOSKELETAL: There are no major deformities or cyanosis. NEUROLOGIC: No focal weakness or paresthesias are detected. SKIN: There are no ulcers or rashes noted. PSYCHIATRIC: The patient has a normal affect.  STUDIES:   I have reviewed the carotid duplex with the following findings: Right Carotid: Velocities in the right ICA are consistent with a 1-39% stenosis.  Left Carotid: Velocities in the left ICA are consistent with a 1-39% stenosis.  Vertebrals: Bilateral vertebral arteries demonstrate antegrade  flow. Subclavians: Normal flow hemodynamics were seen in bilateral subclavian       arteries.  Via care everywhere, I was able to find his CT scan, performed at [facility] on 07/19/2017.  This shows a 5 x 3 mm anterior communicating artery aneurysm  ASSESSMENT and PLAN   Anterior communicating artery aneurysm: I discussed with the patient that he would need referral to neurosurgery  for further evaluation and management of this issue.  I have made this referral.    Extracranial carotid artery: Ultrasound today shows widely patent carotid arteries with no evidence of stenosis  Chronic lower extremity edema: This is attributed to immunosuppressive therapy.  I have encouraged the patient to get lower extremity compression stockings so as to avoid complications in the future.  Patient will follow-up on an as-needed basis.     Annamarie Major, MD [facility] [phone] Pager [phone]

## 2017-09-11 DIAGNOSIS — I671 Cerebral aneurysm, nonruptured: Secondary | ICD-10-CM | POA: Diagnosis not present

## 2017-09-11 DIAGNOSIS — R030 Elevated blood-pressure reading, without diagnosis of hypertension: Secondary | ICD-10-CM | POA: Diagnosis not present

## 2017-09-11 DIAGNOSIS — Z6825 Body mass index (BMI) 25.0-25.9, adult: Secondary | ICD-10-CM | POA: Diagnosis not present

## 2017-09-20 DIAGNOSIS — J324 Chronic pansinusitis: Secondary | ICD-10-CM | POA: Diagnosis not present

## 2017-09-20 DIAGNOSIS — Z7952 Long term (current) use of systemic steroids: Secondary | ICD-10-CM | POA: Diagnosis not present

## 2017-09-20 DIAGNOSIS — Z79899 Other long term (current) drug therapy: Secondary | ICD-10-CM | POA: Diagnosis not present

## 2017-09-20 DIAGNOSIS — M3130 Wegener's granulomatosis without renal involvement: Secondary | ICD-10-CM | POA: Diagnosis not present

## 2017-09-20 DIAGNOSIS — Z9889 Other specified postprocedural states: Secondary | ICD-10-CM | POA: Diagnosis not present

## 2017-09-29 DIAGNOSIS — H40053 Ocular hypertension, bilateral: Secondary | ICD-10-CM | POA: Diagnosis not present

## 2017-09-29 DIAGNOSIS — H40043 Steroid responder, bilateral: Secondary | ICD-10-CM | POA: Diagnosis not present

## 2017-09-29 DIAGNOSIS — H0100A Unspecified blepharitis right eye, upper and lower eyelids: Secondary | ICD-10-CM | POA: Diagnosis not present

## 2017-09-29 DIAGNOSIS — H25813 Combined forms of age-related cataract, bilateral: Secondary | ICD-10-CM | POA: Diagnosis not present

## 2017-10-03 ENCOUNTER — Other Ambulatory Visit: Payer: Self-pay | Admitting: Neurosurgery

## 2017-10-03 DIAGNOSIS — I671 Cerebral aneurysm, nonruptured: Secondary | ICD-10-CM

## 2017-10-12 ENCOUNTER — Other Ambulatory Visit: Payer: Medicare Other

## 2017-10-16 ENCOUNTER — Ambulatory Visit (INDEPENDENT_AMBULATORY_CARE_PROVIDER_SITE_OTHER): Payer: Medicare Other | Admitting: Internal Medicine

## 2017-10-16 ENCOUNTER — Encounter: Payer: Self-pay | Admitting: Internal Medicine

## 2017-10-16 VITALS — BP 148/84 | HR 70 | Ht 70.0 in | Wt 177.2 lb

## 2017-10-16 DIAGNOSIS — I6521 Occlusion and stenosis of right carotid artery: Secondary | ICD-10-CM

## 2017-10-16 DIAGNOSIS — I483 Typical atrial flutter: Secondary | ICD-10-CM

## 2017-10-16 DIAGNOSIS — R609 Edema, unspecified: Secondary | ICD-10-CM | POA: Diagnosis not present

## 2017-10-16 DIAGNOSIS — R0602 Shortness of breath: Secondary | ICD-10-CM | POA: Diagnosis not present

## 2017-10-16 MED ORDER — AMIODARONE HCL 200 MG PO TABS: 100.0000 mg | ORAL_TABLET | Freq: Every day | ORAL | 3 refills | Status: DC

## 2017-10-16 NOTE — Progress Notes (Signed)
PCP: Haywood Pao, MD   Primary EP: Dr [name]  Chad Morrison is a 84 y.o. male who presents today for routine electrophysiology followup.  Since last being seen in our clinic, the patient reports doing reasonably well. He continues to have SOB with moderate activity which he attributes to asthma as well as edema which he attributes to prednisone.  Today, he denies symptoms of palpitations, chest pain,  dizziness, presyncope, or syncope.  The patient is otherwise without complaint today.   Past Medical History:  Diagnosis Date  . Anxiety   . Asthma   . Asthmatic bronchitis   . Atrial flutter (HCC)    chads2vasc score is 3  . Ehrlichiosis 6629  . GERD (gastroesophageal reflux disease)   . Granulomatosis with polyangiitis ([place])   . HTN (hypertension)   . Hyperlipidemia   . Paroxysmal atrial fibrillation (HCC)   . Prostate cancer ([place])   . Seasonal allergies    Past Surgical History:  Procedure Laterality Date  . BASAL CELL CARCINOMA EXCISION  2013   EYELID  . HERNIA REPAIR Bilateral 2000, 2002  . KNEE ARTHROSCOPY  2000  . prostectomy  1995   RADICAL  . TONSILLECTOMY  1940    ROS- all systems are reviewed and negatives except as per HPI above  Current Outpatient Medications  Medication Sig Dispense Refill  . albuterol (PROVENTIL HFA;VENTOLIN HFA) 108 (90 Base) MCG/ACT inhaler Inhale 1-2 puffs into the lungs every 6 (six) hours as needed for wheezing or shortness of breath. 3 Inhaler 3  . ALPRAZolam (XANAX) 0.25 MG tablet Take 0.25 mg at bedtime as needed by mouth for anxiety.    Marland Kitchen amiodarone (PACERONE) 200 MG tablet Take 1 tablet (200 mg total) by mouth daily. 90 tablet 3  . apixaban (ELIQUIS) 5 MG TABS tablet Take 1 tablet (5 mg total) by mouth 2 (two) times daily. 180 tablet 3  . bimatoprost (LUMIGAN) 0.03 % ophthalmic solution Place 1 drop into the right eye at bedtime.    . brimonidine (ALPHAGAN P) 0.1 % SOLN Place 1 drop into the right eye 2 (two) times daily.    .  budesonide-formoterol (SYMBICORT) 160-4.5 MCG/ACT inhaler Inhale 2 puffs 2 (two) times daily into the lungs.    . calcium acetate (PHOSLO) 667 MG capsule Take 1,334 mg daily by mouth.    . diphenhydrAMINE (BENADRYL) 25 MG tablet Take 2 tablets by mouth daily.    Marland Kitchen esomeprazole (NEXIUM) 40 MG capsule Take 1 capsule by mouth daily.    . folic acid (FOLVITE) 1 MG tablet Take 1 mg by mouth daily.    Marland Kitchen levocetirizine (XYZAL) 5 MG tablet Take 5 mg every evening by mouth.    . levothyroxine (SYNTHROID, LEVOTHROID) 75 MCG tablet Take 75 mcg by mouth daily before breakfast.    . methotrexate (RHEUMATREX) 2.5 MG tablet Take 20 mg by mouth once a week. Caution:Chemotherapy. Protect from light.     . montelukast (SINGULAIR) 10 MG tablet Take 10 mg at bedtime by mouth.    . Multiple Vitamin (MULTIVITAMIN WITH MINERALS) TABS tablet Take 1 tablet daily by mouth.    . Multiple Vitamins-Minerals (OCUVITE PRESERVISION PO) Take 1 tablet 2 (two) times daily by mouth.    Marland Kitchen omeprazole (PRILOSEC) 40 MG capsule Take 40 mg daily by mouth.    . predniSONE (DELTASONE) 10 MG tablet Take 10 mg by mouth daily with breakfast.    . ranitidine (ZANTAC) 150 MG tablet Take 0.5 tablets by mouth  daily.    . simvastatin (ZOCOR) 10 MG tablet Take 10 mg daily by mouth.    . triamcinolone (NASACORT ALLERGY 24HR) 55 MCG/ACT AERO nasal inhaler Place 2 sprays 2 (two) times daily into the nose.     No current facility-administered medications for this visit.     Physical Exam: Vitals:   10/16/17 1117  BP: (!) 148/84  Pulse: 70  SpO2: 98%  Weight: 177 lb 3.2 oz (80.4 kg)  Height: 5\' 10"  (1.778 m)    GEN- The patient is well appearing, alert and oriented x 3 today.   Head- normocephalic, atraumatic Eyes-  Sclera clear, conjunctiva pink Ears- hearing intact Oropharynx- clear Lungs- Clear to ausculation bilaterally, normal work of breathing Heart- Regular rate and rhythm, no murmurs, rubs or gallops, PMI not laterally  displaced GI- soft, NT, ND, + BS Extremities- no clubbing, cyanosis, +1 edema  Wt Readings from Last 3 Encounters:  10/16/17 177 lb 3.2 oz (80.4 kg)  08/28/17 176 lb (79.8 kg)  08/07/17 176 lb 9.6 oz (80.1 kg)    EKG tracing ordered today is personally reviewed and shows sinus rhythm 70 bpm, PR 168 msec, QRS 158 msec, Qtc 486 msec, RBBB  Assessment and Plan:  1. Typical atrial flutter chads2vasc score is 3.  Doing well with amiodarone I have again advised ablation so that we can get him off of amiodarone long term. I worry that he has both thyroid and lung issues chronically.  We spoke specifically and extensively about risks of amiodarone including otic, thyroid, hepatic, and pulmonary risks.  He and his wife understand risks but continue to want to defer ablation.  He states "I will consider ablation once I am off of prednisone." Reduce amiodarone to 100mg  daily.  He has endocrine following TFTs, rheumatology following LFTS (also on methotrexate), and pulmonary following PFTs.  I have therefore not ordered additional testing today. He will contact my office if he decides to proceed with atrial flutter ablation.  Though there is suggestion of atrial fibrillation, this has not been well documented and the patient does not recall.  We will assess with EPS eventually.  Consider implantable loop recorder after ablation with plans to stop anticoagulation if no afib is detected after atrial flutter ablation.  2. SOB/ edema Unclear etiology Followed by pulmonary Will obtain echo at this time.  Return to see EP NP in 3 months  Thompson Grayer MD, [facility] 10/16/2017 11:30 AM

## 2017-10-16 NOTE — Patient Instructions (Addendum)
Medication Instructions:  Your physician has recommended you make the following change in your medication: 1.  Reduce your amiodarone 200 mg tablet- Take 1/2 tablet by mouth daily.  Labwork: None ordered.  Testing/Procedures: Your physician has requested that you have an echocardiogram. Echocardiography is a painless test that uses sound waves to create images of your heart. It provides your doctor with information about the size and shape of your heart and how well your heart's chambers and valves are working. This procedure takes approximately one hour. There are no restrictions for this procedure.  Please schedule for an ECHO  Follow-Up: Your physician wants you to follow-up in: 3 months with Chanetta Marshall, NP.      Any Other Special Instructions Will Be Listed Below (If Applicable).  If you need a refill on your cardiac medications before your next appointment, please call your pharmacy.

## 2017-10-18 ENCOUNTER — Ambulatory Visit (INDEPENDENT_AMBULATORY_CARE_PROVIDER_SITE_OTHER): Payer: Medicare Other | Admitting: Pulmonary Disease

## 2017-10-18 ENCOUNTER — Encounter: Payer: Self-pay | Admitting: Pulmonary Disease

## 2017-10-18 VITALS — BP 140/82 | HR 64 | Ht 67.91 in | Wt 174.0 lb

## 2017-10-18 DIAGNOSIS — K219 Gastro-esophageal reflux disease without esophagitis: Secondary | ICD-10-CM | POA: Diagnosis not present

## 2017-10-18 DIAGNOSIS — R0600 Dyspnea, unspecified: Secondary | ICD-10-CM

## 2017-10-18 DIAGNOSIS — I776 Arteritis, unspecified: Secondary | ICD-10-CM

## 2017-10-18 DIAGNOSIS — J310 Chronic rhinitis: Secondary | ICD-10-CM | POA: Diagnosis not present

## 2017-10-18 DIAGNOSIS — M3130 Wegener's granulomatosis without renal involvement: Secondary | ICD-10-CM

## 2017-10-18 DIAGNOSIS — I6521 Occlusion and stenosis of right carotid artery: Secondary | ICD-10-CM

## 2017-10-18 DIAGNOSIS — J4540 Moderate persistent asthma, uncomplicated: Secondary | ICD-10-CM | POA: Diagnosis not present

## 2017-10-18 NOTE — Patient Instructions (Signed)
Shortness of breath: As stated today, I think there is many possible causes for this including her sinus disease, possibly acid reflux making vocal cord inflammation and mucus buildup, or the vasculitis affecting her lungs.  I like to start by checking a lung function test again to compare to the one earlier this year Take the Spiriva 2 puffs daily given to you by Dr. 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

## 2017-10-18 NOTE — Progress Notes (Signed)
Subjective:    Patient ID: Chad Morrison, male    DOB: 1932/07/09, 84 y.o.   MRN: [id number]  Synopsis: Referred in 2018 for evaluation of asthma.  He was diagnosed with ANCA associated vasculitis in 2019.  HPI Chief Complaint  Patient presents with  . Follow-up   Hance says that his sinus congestion has been significantly worse recently.  He has noticed increasing throat congestion particularly at night.  He has a lot of postnasal drip.  He is using saline rinses routinely.  He is taking prednisone, methotrexate, omeprazole, ranitidine, nasal fluticasone, Singulair, and size all routinely.  Despite all of these medicines he continues to have increasing shortness of breath, congestion in his throat, and cough.  He says that Benadryl is helpful.  He has been seeing ENT at [facility] and rheumatology at [facility].  No recent bronchitis.  He never took the Spiriva that was provided to him by Dr. Halford Chessman on the last visit.  Increased throat congestion at night Benadryl helps Has more trouble breathing lately, he thinks that this is independent of the  Has not been back to ENT: [facility]   Past Medical History:  Diagnosis Date  . Anxiety   . Asthma   . Asthmatic bronchitis   . Atrial flutter (HCC)    chads2vasc score is 3  . Ehrlichiosis 4081  . GERD (gastroesophageal reflux disease)   . Granulomatosis with polyangiitis ([place])   . HTN (hypertension)   . Hyperlipidemia   . Paroxysmal atrial fibrillation (HCC)   . Prostate cancer ([place])   . Seasonal allergies          Review of Systems  Constitutional: Negative for fever and unexpected weight change.  HENT: Negative for congestion, dental problem, ear pain, nosebleeds, postnasal drip, rhinorrhea, sinus pressure, sneezing, sore throat and trouble swallowing.   Eyes: Negative for redness and itching.  Respiratory: Negative for cough, chest tightness, shortness of breath and wheezing.   Cardiovascular: Negative for palpitations  and leg swelling.  Gastrointestinal: Negative for nausea and vomiting.  Genitourinary: Negative for dysuria.  Musculoskeletal: Negative for joint swelling.  Skin: Negative for rash.  Neurological: Negative for headaches.  Hematological: Does not bruise/bleed easily.  Psychiatric/Behavioral: Negative for dysphoric mood. The patient is not nervous/anxious.        Objective:   Physical Exam  Vitals:   10/18/17 1144  BP: 140/82  Pulse: 64  SpO2: 98%  Weight: 174 lb (78.9 kg)  Height: 5' 7.91" (1.725 m)    Gen: chronically ill appearing HENT: OP clear, TM's clear, neck supple PULM: wheezing worse in bases B, normal percussion CV: RRR, no mgr, trace edema GI: BS+, soft, nontender Derm: no cyanosis or rash Psyche: normal mood and affect  Pulmonary function testing: March 2019 ratio 65%, FEV1 2.44 L 88% predicted, FVC 3.78 L 96% predicted, total lung capacity 7.41 L 104% predicted, residual volume 152% predict, DLCO 22.8 70% predicted  CBC No results found for: WBC, RBC, HGB, HCT, PLT, MCV, MCH, MCHC, RDW, LYMPHSABS, MONOABS, EOSABS, BASOSABS  06/2017 CXR > flattening of diaphragms, no infiltrate  Records from his 07/2017 visit with [facility] rheum reviewed where his ANCA vasculitis was treated with MTX and prednisone was tapered     Assessment & Plan:   Dyspnea, unspecified type - Plan: Pulmonary function test  Moderate persistent chronic asthma without complication  Chronic rhinitis  Gastroesophageal reflux disease without esophagitis  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 Hamblen.  He is having worsening dyspnea, sinus congestion, postnasal drip in the setting of his known small vessel vasculitis, gastroesophageal reflux disease, and allergic rhinitis.  While all of his symptoms may be due to poorly controlled small vessel vasculitis causing postnasal drip and sinus congestion with mucus production in his throat, it is also possible  that the small vessel vasculitis is affecting his lungs.  I wish that he had better gastroesophageal reflux controlled despite his aggressive regimen, the same is true for his allergic rhinitis.  He says that nasal antihistamines have not been helpful in the past.  Right now he is on what I would consider a fairly maximal regimen for allergic rhinitis but still has symptoms.  This makes me think that the small vessel vasculitis is causing the problem.  Plan: Shortness of breath: As stated today, I think there is many possible causes for this including her sinus disease, possibly acid reflux making vocal cord inflammation and mucus buildup, or the vasculitis affecting her lungs.  I like to start by checking a lung function test again to compare to the one earlier this year Take the Spiriva 2 puffs daily given to you by Dr. 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  albuterol (PROVENTIL HFA;VENTOLIN HFA) 108 (90 Base) MCG/ACT inhaler, Inhale 1-2 puffs into the lungs every 6 (six) hours as needed for wheezing or shortness of breath., Disp: 3 Inhaler, Rfl: 3 .  ALPRAZolam (XANAX) 0.25 MG tablet, Take 0.25 mg at bedtime as needed by mouth for anxiety., Disp: , Rfl:  .  amiodarone  (PACERONE) 200 MG tablet, Take 0.5 tablets (100 mg total) by mouth daily., Disp: 45 tablet, Rfl: 3 .  apixaban (ELIQUIS) 5 MG TABS tablet, Take 1 tablet (5 mg total) by mouth 2 (two) times daily., Disp: 180 tablet, Rfl: 3 .  bimatoprost (LUMIGAN) 0.03 % ophthalmic solution, Place 1 drop into the right eye at bedtime., Disp: , Rfl:  .  brimonidine (ALPHAGAN P) 0.1 % SOLN, Place 1 drop into the right eye 2 (two) times daily., Disp: , Rfl:  .  budesonide-formoterol (SYMBICORT) 160-4.5 MCG/ACT inhaler, Inhale 2 puffs 2 (two) times daily into the lungs., Disp: , Rfl:  .  calcium acetate (PHOSLO) 667 MG capsule, Take 1,334 mg daily by mouth., Disp: , Rfl:  .  diphenhydrAMINE (BENADRYL) 25 MG tablet, Take 2 tablets by mouth daily., Disp: , Rfl:  .  esomeprazole (NEXIUM) 40 MG capsule, Take 1 capsule by mouth daily., Disp: , Rfl:  .  folic acid (FOLVITE) 1 MG tablet, Take 1 mg by mouth daily., Disp: , Rfl:  .  levocetirizine (XYZAL) 5 MG tablet, Take 5 mg every evening by mouth., Disp: , Rfl:  .  levothyroxine (SYNTHROID, LEVOTHROID) 75 MCG  tablet, Take 75 mcg by mouth daily before breakfast., Disp: , Rfl:  .  methotrexate (RHEUMATREX) 2.5 MG tablet, Take 20 mg by mouth once a week. Caution:Chemotherapy. Protect from light. , Disp: , Rfl:  .  montelukast (SINGULAIR) 10 MG tablet, Take 10 mg at bedtime by mouth., Disp: , Rfl:  .  Multiple Vitamin (MULTIVITAMIN WITH MINERALS) TABS tablet, Take 1 tablet daily by mouth., Disp: , Rfl:  .  Multiple Vitamins-Minerals (OCUVITE PRESERVISION PO), Take 1 tablet 2 (two) times daily by mouth., Disp: , Rfl:  .  omeprazole (PRILOSEC) 40 MG capsule, Take 40 mg daily by mouth., Disp: , Rfl:  .  predniSONE (DELTASONE) 10 MG tablet, Take 10 mg by mouth daily with breakfast., Disp: , Rfl:  .  ranitidine (ZANTAC) 150 MG tablet, Take 0.5 tablets by mouth daily., Disp: , Rfl:  .  simvastatin (ZOCOR) 10 MG tablet, Take 10 mg daily by mouth., Disp: , Rfl:  .  triamcinolone  (NASACORT ALLERGY 24HR) 55 MCG/ACT AERO nasal inhaler, Place 2 sprays 2 (two) times daily into the nose., Disp: , Rfl:

## 2017-10-23 ENCOUNTER — Other Ambulatory Visit: Payer: Self-pay

## 2017-10-23 ENCOUNTER — Ambulatory Visit (HOSPITAL_COMMUNITY): Payer: Medicare Other | Attending: Cardiovascular Disease

## 2017-10-23 DIAGNOSIS — I081 Rheumatic disorders of both mitral and tricuspid valves: Secondary | ICD-10-CM | POA: Insufficient documentation

## 2017-10-23 DIAGNOSIS — I5030 Unspecified diastolic (congestive) heart failure: Secondary | ICD-10-CM | POA: Insufficient documentation

## 2017-10-23 DIAGNOSIS — J45909 Unspecified asthma, uncomplicated: Secondary | ICD-10-CM | POA: Diagnosis not present

## 2017-10-23 DIAGNOSIS — R0602 Shortness of breath: Secondary | ICD-10-CM | POA: Diagnosis not present

## 2017-10-23 DIAGNOSIS — R609 Edema, unspecified: Secondary | ICD-10-CM

## 2017-10-23 DIAGNOSIS — I110 Hypertensive heart disease with heart failure: Secondary | ICD-10-CM | POA: Diagnosis not present

## 2017-10-23 DIAGNOSIS — I4892 Unspecified atrial flutter: Secondary | ICD-10-CM | POA: Insufficient documentation

## 2017-10-23 DIAGNOSIS — E785 Hyperlipidemia, unspecified: Secondary | ICD-10-CM | POA: Diagnosis not present

## 2017-10-23 DIAGNOSIS — I480 Paroxysmal atrial fibrillation: Secondary | ICD-10-CM | POA: Diagnosis not present

## 2017-10-24 ENCOUNTER — Telehealth: Payer: Self-pay | Admitting: Pulmonary Disease

## 2017-10-24 MED ORDER — LEVOCETIRIZINE DIHYDROCHLORIDE 5 MG PO TABS: 5.0000 mg | ORAL_TABLET | Freq: Every evening | ORAL | 3 refills | Status: DC

## 2017-10-24 NOTE — Telephone Encounter (Signed)
Pre last ov Dr. Lake Bells okay to continue the xyzal. I will send in the refills to the preferred pharmacy. I have called the patient and made aware  of this. Nothing further needed at this time.

## 2017-10-25 ENCOUNTER — Telehealth: Payer: Self-pay | Admitting: Internal Medicine

## 2017-10-25 ENCOUNTER — Telehealth: Payer: Self-pay | Admitting: Pulmonary Disease

## 2017-10-25 MED ORDER — BUDESONIDE-FORMOTEROL FUMARATE 160-4.5 MCG/ACT IN AERO: 2.0000 | INHALATION_SPRAY | Freq: Two times a day (BID) | RESPIRATORY_TRACT | 5 refills | Status: DC

## 2017-10-25 NOTE — Telephone Encounter (Signed)
Message sent via [place] notifying Pt his ECHO results would be provided as soon as Dr. [name] releases them.

## 2017-10-25 NOTE — Telephone Encounter (Signed)
Spoke with pt and advised rx sent to pharmacy. Nothing further is needed.   

## 2017-10-25 NOTE — Telephone Encounter (Signed)
Please call with echo results 

## 2017-10-27 DIAGNOSIS — H2513 Age-related nuclear cataract, bilateral: Secondary | ICD-10-CM | POA: Diagnosis not present

## 2017-10-27 DIAGNOSIS — H40053 Ocular hypertension, bilateral: Secondary | ICD-10-CM | POA: Diagnosis not present

## 2017-10-27 DIAGNOSIS — H0510 Unspecified chronic inflammatory disorders of orbit: Secondary | ICD-10-CM | POA: Diagnosis not present

## 2017-10-31 DIAGNOSIS — Z79899 Other long term (current) drug therapy: Secondary | ICD-10-CM | POA: Diagnosis not present

## 2017-10-31 DIAGNOSIS — H0510 Unspecified chronic inflammatory disorders of orbit: Secondary | ICD-10-CM | POA: Diagnosis not present

## 2017-11-08 ENCOUNTER — Telehealth: Payer: Self-pay | Admitting: Pulmonary Disease

## 2017-11-08 ENCOUNTER — Telehealth: Payer: Self-pay | Admitting: *Deleted

## 2017-11-08 ENCOUNTER — Ambulatory Visit (INDEPENDENT_AMBULATORY_CARE_PROVIDER_SITE_OTHER): Payer: Medicare Other | Admitting: Nurse Practitioner

## 2017-11-08 ENCOUNTER — Encounter: Payer: Self-pay | Admitting: Nurse Practitioner

## 2017-11-08 VITALS — BP 146/84 | HR 78 | Ht 67.9 in | Wt 171.6 lb

## 2017-11-08 DIAGNOSIS — I4891 Unspecified atrial fibrillation: Secondary | ICD-10-CM | POA: Diagnosis not present

## 2017-11-08 DIAGNOSIS — J449 Chronic obstructive pulmonary disease, unspecified: Secondary | ICD-10-CM | POA: Diagnosis not present

## 2017-11-08 DIAGNOSIS — I499 Cardiac arrhythmia, unspecified: Secondary | ICD-10-CM | POA: Diagnosis not present

## 2017-11-08 DIAGNOSIS — I4821 Permanent atrial fibrillation: Secondary | ICD-10-CM | POA: Insufficient documentation

## 2017-11-08 NOTE — Assessment & Plan Note (Addendum)
Patient Instructions  EKG shows A-fib Patient is on amiodarone and Eliquis Dr. [name] notified - forwarded EKG results - He will get patient into a-fib clinic tomorrow Dr. [name] recommended to increase Amiodarone back to 200 mg for now until seen by a-fib clinic Please call cardiologist for recommendations Continue Symbicort and Spiriva We will reschedule PFT that was scheduled for tomorrow Go to the ED if symptoms worsen

## 2017-11-08 NOTE — Telephone Encounter (Signed)
Called and spoke with patient, when patient answered the phone I could hear that the patient was having very labored breathing. Patient stated that his SOB is worse than it has ever been. Patient refused visit to ER. Patient is scheduled to come in today

## 2017-11-08 NOTE — Patient Instructions (Addendum)
EKG shows A-fib Patient is on amiodarone and Eliquis Dr. [name] notified - forwarded EKG results - He will get patient into a-fib clinic tomorrow Dr. [name] recommended to increase Amiodarone back to 200 mg for now until seen by a-fib clinic Please call cardiologist for recommendations Continue Symbicort and Spiriva We will reschedule PFT that was scheduled for tomorrow Go to the ED if symptoms worsen

## 2017-11-08 NOTE — Assessment & Plan Note (Addendum)
Patient Instructions  EKG shows A-fib Patient is on amiodarone and Eliquis Dr. [name] notified - forwarded EKG results - He will get patient into a-fib clinic tomorrow Dr. [name] recommended to increase Amiodarone back to 200 mg for now until seen by a-fib clinic Please call cardiologist for recommendations Continue Symbicort and Spiriva We will reschedule PFT that was scheduled for tomorrow Go to the ED if symptoms worsen    Dr. [name] was consulted on EKG results in office today. He had decreased amiodarone due to toxicity concerns and had discussed ablation with patient and patient refused at that time. Dr. [name] recommends increasing amiodarone back to 200 mg daily for now and he will get the patient scheduled at a-fib clinic within the next couple of days.

## 2017-11-08 NOTE — Telephone Encounter (Signed)
Dr. [name] received a call from pulmonology today reporting patient w/ irregular pulse, elevated HRs and SOB when in the office today. [facility]red advised them to have patient increase Amiodarone to 200mg . OV needed in the AFib clinic this week, per Allred.  Will forward to AFib clinical staff to address.

## 2017-11-08 NOTE — Progress Notes (Addendum)
@Patient  ID: Chad Morrison, male    DOB: [birth year], 84 y.o.   MRN: [id number]  Chief Complaint  Patient presents with  . Shortness of Breath    Referring provider: Tisovec, Fransico Him, MD  HPI  84 year old former smoker followed by Dr. Lake Bells for COPD with asthma and allergic rhinitis.  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   Tests: ECHO 10/23/17>> Moderate AS, EF 60-65% Echo 09/08/16 >> mild LVH, EF 60 to 65% PFT 05/24/17 >> FEV1 2.44 (88%), FEV1% 65, TLC 7.41 (104%), DLCO 70% Doppler legs 07/03/17 >> no DVT; Baker's cyst Rt popliteal fossa  OV 11/08/17 - Acute shortness of breath Patient presents with shortness of breath. This has been an ongoing issue. Patient states that it has progressively worsened over he past couple of weeks. He has been compliant with Symbicort and Spiriva. He reports that he started Spiriva at last visit (1 month ago) and has been taking it as directed, but it hasn't seemed to help any. He denies any chest pain or peripheral edema.   Note: When walked in office today his Sats stayed above 95%, but his heart rate was noted to be over 120 BPM. EKG today showed A-fib with RBBB.  Patient states that he saw Dr. [name] cardiologist 2 weeks ago and he decreased his amiodarone. He was in NSR at that visit.   Dr. [name] was consulted on EKG results in office today. He had decreased amiodarone due to toxicity concerns and had discussed ablation with patient and patient refused at that time. Dr. [name] recommends increasing amiodarone back to 200 mg daily for now and he will get the patient scheduled at a-fib clinic within the next couple of days.   Allergies  Allergen Reactions  . Latex Hives  . Penicillins Hives  . Sulfa Antibiotics Other (See Comments)    unknown unknown  unknown     Immunization History  Administered Date(s) Administered  . Influenza, High Dose Seasonal PF 12/27/2016  . Pneumococcal Conjugate-13 01/04/2016  .  Pneumococcal Polysaccharide-23 01/03/2014  . Pneumococcal-Unspecified 10/31/2016    Past Medical History:  Diagnosis Date  . Anxiety   . Asthma   . Asthmatic bronchitis   . Atrial flutter (HCC)    chads2vasc score is 3  . Ehrlichiosis 6812  . GERD (gastroesophageal reflux disease)   . 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 HTN (hypertension)   . Hyperlipidemia   . Paroxysmal atrial fibrillation (HCC)   . Prostate cancer ([place])   . Seasonal allergies     Tobacco History: Social History   Tobacco Use  Smoking Status Former Smoker  . Packs/day: 0.50  . Years: 4.00  . Pack years: 2.00  . Types: Cigarettes  . Last attempt to quit: 02/28/1961  . Years since quitting: 56.7  Smokeless Tobacco Never Used   Counseling given: Yes   Outpatient Encounter Medications as of 11/08/2017  Medication Sig  . albuterol (PROVENTIL HFA;VENTOLIN HFA) 108 (90 Base) MCG/ACT inhaler Inhale 1-2 puffs into the lungs every 6 (six) hours as needed for wheezing or shortness of breath.  . ALPRAZolam (XANAX) 0.25 MG tablet Take 0.25 mg at bedtime as needed by mouth for anxiety.  Marland Kitchen amiodarone (PACERONE) 200 MG tablet Take 0.5 tablets (100 mg total) by mouth daily.  Marland Kitchen apixaban (ELIQUIS) 5 MG TABS tablet Take 1 tablet (5 mg total) by mouth 2 (two) times daily.  . bimatoprost (LUMIGAN) 0.03 % ophthalmic solution Place 1 drop into the  right eye at bedtime.  . budesonide-formoterol (SYMBICORT) 160-4.5 MCG/ACT inhaler Inhale 2 puffs into the lungs 2 (two) times daily.  . calcium acetate (PHOSLO) 667 MG capsule Take 1,334 mg daily by mouth.  . diphenhydrAMINE (BENADRYL) 25 MG tablet Take 2 tablets by mouth daily.  Marland Kitchen esomeprazole (NEXIUM) 40 MG capsule Take 1 capsule by mouth daily.  . folic acid (FOLVITE) 1 MG tablet Take 1 mg by mouth daily.  Marland Kitchen levocetirizine (XYZAL) 5 MG tablet Take 1 tablet (5 mg total) by mouth every evening.  Marland Kitchen levothyroxine (SYNTHROID, LEVOTHROID) 75 MCG tablet Take 75 mcg by mouth  daily before breakfast.  . methotrexate (RHEUMATREX) 2.5 MG tablet Take 20 mg by mouth once a week. Caution:Chemotherapy. Protect from light.   . montelukast (SINGULAIR) 10 MG tablet Take 10 mg at bedtime by mouth.  . Multiple Vitamin (MULTIVITAMIN WITH MINERALS) TABS tablet Take 1 tablet daily by mouth.  . Multiple Vitamins-Minerals (OCUVITE PRESERVISION PO) Take 1 tablet 2 (two) times daily by mouth.  Marland Kitchen omeprazole (PRILOSEC) 40 MG capsule Take 40 mg daily by mouth.  . predniSONE (DELTASONE) 10 MG tablet Take 10 mg by mouth daily with breakfast.  . ranitidine (ZANTAC) 150 MG tablet Take 0.5 tablets by mouth daily.  . simvastatin (ZOCOR) 10 MG tablet Take 10 mg daily by mouth.  . triamcinolone (NASACORT ALLERGY 24HR) 55 MCG/ACT AERO nasal inhaler Place 2 sprays 2 (two) times daily into the nose.  . [DISCONTINUED] brimonidine (ALPHAGAN P) 0.1 % SOLN Place 1 drop into the right eye 2 (two) times daily.   No facility-administered encounter medications on file as of 11/08/2017.      Review of Systems  Review of Systems  Constitutional: Negative.  Negative for chills and fever.  HENT: Positive for congestion.   Respiratory: Positive for cough and shortness of breath.   Cardiovascular: Negative for chest pain, palpitations and leg swelling.  Gastrointestinal: Negative.   Allergic/Immunologic: Negative.   Neurological: Negative.   Psychiatric/Behavioral: Negative.        Physical Exam  BP (!) 146/84 (BP Location: Left Arm, Patient Position: Sitting, Cuff Size: Normal)   Pulse 78   Ht 5' 7.9" (1.725 m)   Wt 171 lb 9.6 oz (77.8 kg)   SpO2 96%   BMI 26.17 kg/m   Wt Readings from Last 5 Encounters:  11/08/17 171 lb 9.6 oz (77.8 kg)  10/18/17 174 lb (78.9 kg)  10/16/17 177 lb 3.2 oz (80.4 kg)  08/28/17 176 lb (79.8 kg)  08/07/17 176 lb 9.6 oz (80.1 kg)     Physical Exam  Constitutional: He is oriented to person, place, and time. He appears well-developed and well-nourished. No  distress.  Cardiovascular: An irregular rhythm present. Tachycardia present.  Pulmonary/Chest: Effort normal and breath sounds normal.  Neurological: He is alert and oriented to person, place, and time.  Skin: Skin is warm and dry.  Psychiatric: He has a normal mood and affect.  Nursing note and vitals reviewed.    Assessment & Plan:   COPD (chronic obstructive pulmonary disease) ([place]) Patient Instructions  EKG shows A-fib Patient is on amiodarone and Eliquis Dr. [name] notified - forwarded EKG results - He will get patient into a-fib clinic tomorrow Dr. [name] recommended to increase Amiodarone back to 200 mg for now until seen by a-fib clinic Please call cardiologist for recommendations Continue Symbicort and Spiriva We will reschedule PFT that was scheduled for tomorrow Go to the ED if symptoms worsen  Atrial fibrillation [facility]) Patient Instructions  EKG shows A-fib Patient is on amiodarone and Eliquis Dr. [name] notified - forwarded EKG results - He will get patient into a-fib clinic tomorrow Dr. [name] recommended to increase Amiodarone back to 200 mg for now until seen by a-fib clinic Please call cardiologist for recommendations Continue Symbicort and Spiriva We will reschedule PFT that was scheduled for tomorrow Go to the ED if symptoms worsen    Dr. [name] was consulted on EKG results in office today. He had decreased amiodarone due to toxicity concerns and had discussed ablation with patient and patient refused at that time. Dr. [name] recommends increasing amiodarone back to 200 mg daily for now and he will get the patient scheduled at a-fib clinic within the next couple of days.       Fenton Foy, NP 11/08/2017

## 2017-11-09 ENCOUNTER — Ambulatory Visit: Payer: Medicare Other | Admitting: Nurse Practitioner

## 2017-11-09 ENCOUNTER — Encounter (HOSPITAL_COMMUNITY): Payer: Self-pay | Admitting: Nurse Practitioner

## 2017-11-09 ENCOUNTER — Ambulatory Visit (HOSPITAL_COMMUNITY)
Admission: RE | Admit: 2017-11-09 | Discharge: 2017-11-09 | Disposition: A | Discharge status: Home / Self Care | Payer: Medicare Other | Source: Ambulatory Visit | Attending: Nurse Practitioner | Admitting: Nurse Practitioner

## 2017-11-09 VITALS — BP 166/88 | HR 120 | Ht 67.9 in | Wt 169.0 lb

## 2017-11-09 DIAGNOSIS — Z7901 Long term (current) use of anticoagulants: Secondary | ICD-10-CM | POA: Insufficient documentation

## 2017-11-09 DIAGNOSIS — Z825 Family history of asthma and other chronic lower respiratory diseases: Secondary | ICD-10-CM | POA: Diagnosis not present

## 2017-11-09 DIAGNOSIS — Z9889 Other specified postprocedural states: Secondary | ICD-10-CM | POA: Diagnosis not present

## 2017-11-09 DIAGNOSIS — Z79899 Other long term (current) drug therapy: Secondary | ICD-10-CM | POA: Insufficient documentation

## 2017-11-09 DIAGNOSIS — Z87891 Personal history of nicotine dependence: Secondary | ICD-10-CM | POA: Insufficient documentation

## 2017-11-09 DIAGNOSIS — K219 Gastro-esophageal reflux disease without esophagitis: Secondary | ICD-10-CM | POA: Insufficient documentation

## 2017-11-09 DIAGNOSIS — Z823 Family history of stroke: Secondary | ICD-10-CM | POA: Diagnosis not present

## 2017-11-09 DIAGNOSIS — Z882 Allergy status to sulfonamides status: Secondary | ICD-10-CM | POA: Diagnosis not present

## 2017-11-09 DIAGNOSIS — Z880 Allergy status to penicillin: Secondary | ICD-10-CM | POA: Diagnosis not present

## 2017-11-09 DIAGNOSIS — Z7951 Long term (current) use of inhaled steroids: Secondary | ICD-10-CM | POA: Insufficient documentation

## 2017-11-09 DIAGNOSIS — I10 Essential (primary) hypertension: Secondary | ICD-10-CM | POA: Diagnosis not present

## 2017-11-09 DIAGNOSIS — I4510 Unspecified right bundle-branch block: Secondary | ICD-10-CM | POA: Insufficient documentation

## 2017-11-09 DIAGNOSIS — F419 Anxiety disorder, unspecified: Secondary | ICD-10-CM | POA: Diagnosis not present

## 2017-11-09 DIAGNOSIS — Z8546 Personal history of malignant neoplasm of prostate: Secondary | ICD-10-CM | POA: Insufficient documentation

## 2017-11-09 DIAGNOSIS — J45909 Unspecified asthma, uncomplicated: Secondary | ICD-10-CM | POA: Diagnosis not present

## 2017-11-09 DIAGNOSIS — Z7952 Long term (current) use of systemic steroids: Secondary | ICD-10-CM | POA: Insufficient documentation

## 2017-11-09 DIAGNOSIS — I480 Paroxysmal atrial fibrillation: Secondary | ICD-10-CM | POA: Diagnosis not present

## 2017-11-09 DIAGNOSIS — I481 Persistent atrial fibrillation: Secondary | ICD-10-CM

## 2017-11-09 DIAGNOSIS — E785 Hyperlipidemia, unspecified: Secondary | ICD-10-CM | POA: Insufficient documentation

## 2017-11-09 DIAGNOSIS — Z833 Family history of diabetes mellitus: Secondary | ICD-10-CM | POA: Diagnosis not present

## 2017-11-09 DIAGNOSIS — Z85828 Personal history of other malignant neoplasm of skin: Secondary | ICD-10-CM | POA: Insufficient documentation

## 2017-11-09 DIAGNOSIS — Z79890 Hormone replacement therapy (postmenopausal): Secondary | ICD-10-CM | POA: Insufficient documentation

## 2017-11-09 DIAGNOSIS — I4819 Other persistent atrial fibrillation: Secondary | ICD-10-CM

## 2017-11-09 LAB — COMPREHENSIVE METABOLIC PANEL
ALT: 25 U/L (ref 0–44)
AST: 26 U/L (ref 15–41)
Albumin: 3.5 g/dL (ref 3.5–5.0)
Alkaline Phosphatase: 56 U/L (ref 38–126)
Anion gap: 11 (ref 5–15)
BUN: 24 mg/dL — ABNORMAL HIGH (ref 8–23)
CO2: 27 mmol/L (ref 22–32)
Calcium: 9.9 mg/dL (ref 8.9–10.3)
Chloride: 103 mmol/L (ref 98–111)
Creatinine, Ser: 1.48 mg/dL — ABNORMAL HIGH (ref 0.61–1.24)
GFR calc Af Amer: 49 mL/min — ABNORMAL LOW (ref >60)
GFR calc non Af Amer: 42 mL/min — ABNORMAL LOW (ref >60)
Glucose, Bld: 136 mg/dL — ABNORMAL HIGH (ref 70–99)
Potassium: 5 mmol/L (ref 3.5–5.1)
Sodium: 141 mmol/L (ref 135–145)
Total Bilirubin: 0.8 mg/dL (ref 0.3–1.2)
Total Protein: 6.5 g/dL (ref 6.5–8.1)

## 2017-11-09 LAB — CBC
HCT: 36.5 % — ABNORMAL LOW (ref 39.0–52.0)
Hemoglobin: 11 g/dL — ABNORMAL LOW (ref 13.0–17.0)
MCH: 24.6 pg — ABNORMAL LOW (ref 26.0–34.0)
MCHC: 30.1 g/dL (ref 30.0–36.0)
MCV: 81.7 fL (ref 78.0–100.0)
Platelets: 272 10*3/uL (ref 150–400)
RBC: 4.47 MIL/uL (ref 4.22–5.81)
RDW: 18.6 % — ABNORMAL HIGH (ref 11.5–15.5)
WBC: 8.4 10*3/uL (ref 4.0–10.5)

## 2017-11-09 LAB — TSH: TSH: 3.781 u[IU]/mL (ref 0.350–4.500)

## 2017-11-09 MED ORDER — AMIODARONE HCL 200 MG PO TABS: 200.0000 mg | ORAL_TABLET | Freq: Two times a day (BID) | ORAL | 3 refills | Status: DC

## 2017-11-09 MED ORDER — DILTIAZEM HCL ER COATED BEADS 120 MG PO CP24: 120.0000 mg | ORAL_CAPSULE | Freq: Every day | ORAL | 6 refills | Status: DC

## 2017-11-09 NOTE — Patient Instructions (Addendum)
Start Cardizem 120mg  once a day   Increase amiodarone to 200mg  twice a day  Cardioversion scheduled for Thursday, September 26th  - Arrive at the [facility] and go to admitting at [place] not eat or drink anything after midnight the night prior to your procedure.  - Take all your morning medication with a sip of water prior to arrival.  - You will not be able to drive home after your procedure.

## 2017-11-09 NOTE — Telephone Encounter (Signed)
Pt for appt 9/12 per Dr. [name]

## 2017-11-09 NOTE — Progress Notes (Signed)
Reviewed, agree 

## 2017-11-09 NOTE — Addendum Note (Signed)
Encounter addended by: Sherran Needs, NP on: 11/09/2017 4:47 PM  Actions taken: LOS modified

## 2017-11-09 NOTE — Progress Notes (Signed)
Primary Care Physician: Tisovec, Fransico Him, MD Referring Physician: Dr. Rosalyn Charters Macken is a 84 y.o. male with a h/o typical atrial flutter, on amiodarone for evaluation in the afib clinic. He saw Dr. [name] in August and was c/o of shortness of breath. There was concern of amiodarone toxicity and pt asked to reduce amio to 100 mg a day. He had refused typical atrial flutter ablation in the past. He saw pulmonology yesterday and found to be in rapid atrial fibrillation. Dr. [name]  was notified  and he was asked to have f/u here. He increased his amio back to 200 mg yesterday and today. Pulmonology is planning to repeat PFT's when his heart rate is controlled. 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 Pulmonology  questioned if lungs were also involved contributing to increased shortness of breath.Pt is unaware of his RVR. He seems to be tolerating well.  Today, he denies symptoms of palpitations, chest pain, shortness of breath, orthopnea, PND, lower extremity edema, dizziness, presyncope, syncope, or neurologic sequela. The patient is tolerating medications without difficulties and is otherwise without complaint today.   Past Medical History:  Diagnosis Date  . Anxiety   . Asthma   . Asthmatic bronchitis   . Atrial flutter (HCC)    chads2vasc score is 3  . Ehrlichiosis 4854  . GERD (gastroesophageal reflux disease)   . Granulomatosis with polyangiitis ([place])   . HTN (hypertension)   . Hyperlipidemia   . Paroxysmal atrial fibrillation (HCC)   . Prostate cancer ([place])   . Seasonal allergies    Past Surgical History:  Procedure Laterality Date  . BASAL CELL CARCINOMA EXCISION  2013   EYELID  . HERNIA REPAIR Bilateral 2000, 2002  . KNEE ARTHROSCOPY  2000  . prostectomy  1995   RADICAL  . TONSILLECTOMY  1940    Current Outpatient Medications  Medication Sig Dispense Refill  . albuterol (PROVENTIL HFA;VENTOLIN HFA) 108 (90 Base) MCG/ACT inhaler  Inhale 1-2 puffs into the lungs every 6 (six) hours as needed for wheezing or shortness of breath. 3 Inhaler 3  . ALPRAZolam (XANAX) 0.25 MG tablet Take 0.25 mg at bedtime as needed by mouth for anxiety.    Marland Kitchen amiodarone (PACERONE) 200 MG tablet Take 1 tablet (200 mg total) by mouth 2 (two) times daily. 45 tablet 3  . apixaban (ELIQUIS) 5 MG TABS tablet Take 1 tablet (5 mg total) by mouth 2 (two) times daily. 180 tablet 3  . bimatoprost (LUMIGAN) 0.03 % ophthalmic solution Place 1 drop into the right eye at bedtime.    . budesonide-formoterol (SYMBICORT) 160-4.5 MCG/ACT inhaler Inhale 2 puffs into the lungs 2 (two) times daily. 1 Inhaler 5  . calcium acetate (PHOSLO) 667 MG capsule Take 1,334 mg daily by mouth.    . diphenhydrAMINE (BENADRYL) 25 MG tablet Take 2 tablets by mouth daily.    Marland Kitchen esomeprazole (NEXIUM) 40 MG capsule Take 1 capsule by mouth daily.    . folic acid (FOLVITE) 1 MG tablet Take 1 mg by mouth daily.    Marland Kitchen levocetirizine (XYZAL) 5 MG tablet Take 1 tablet (5 mg total) by mouth every evening. 93 tablet 3  . levothyroxine (SYNTHROID, LEVOTHROID) 75 MCG tablet Take 75 mcg by mouth daily before breakfast.    . methotrexate (RHEUMATREX) 2.5 MG tablet Take 25 mg by mouth once a week. Caution:Chemotherapy. Protect from light.     . montelukast (SINGULAIR) 10 MG tablet Take 10  mg at bedtime by mouth.    . Multiple Vitamin (MULTIVITAMIN WITH MINERALS) TABS tablet Take 1 tablet daily by mouth.    . Multiple Vitamins-Minerals (OCUVITE PRESERVISION PO) Take 1 tablet 2 (two) times daily by mouth.    Marland Kitchen omeprazole (PRILOSEC) 40 MG capsule Take 40 mg daily by mouth.    . predniSONE (DELTASONE) 10 MG tablet Take 10 mg by mouth daily with breakfast.    . ranitidine (ZANTAC) 150 MG tablet Take 0.5 tablets by mouth daily.    . simvastatin (ZOCOR) 10 MG tablet Take 10 mg daily by mouth.    . triamcinolone (NASACORT ALLERGY 24HR) 55 MCG/ACT AERO nasal inhaler Place 2 sprays 2 (two) times daily into the  nose.    . diltiazem (CARDIZEM CD) 120 MG 24 hr capsule Take 1 capsule (120 mg total) by mouth daily. 30 capsule 6   No current facility-administered medications for this encounter.     Allergies  Allergen Reactions  . Latex Hives  . Penicillins Hives  . Sulfa Antibiotics Other (See Comments)    unknown unknown  unknown     Social History   Socioeconomic History  . Marital status: Married    Spouse name: [name]  . Number of children: 2  . Years of education: Not on file  . Highest education level: Not on file  Occupational History  . Occupation: RETIRED  Social Needs  . Financial resource strain: Not on file  . Food insecurity:    Worry: Not on file    Inability: Not on file  . Transportation needs:    Medical: Not on file    Non-medical: Not on file  Tobacco Use  . Smoking status: Former Smoker    Packs/day: 0.50    Years: 4.00    Pack years: 2.00    Types: Cigarettes    Last attempt to quit: 02/28/1961    Years since quitting: 56.7  . Smokeless tobacco: Never Used  Substance and Sexual Activity  . Alcohol use: Yes  . Drug use: No  . Sexual activity: Not on file  Lifestyle  . Physical activity:    Days per week: Not on file    Minutes per session: Not on file  . Stress: Not on file  Relationships  . Social connections:    Talks on phone: Not on file    Gets together: Not on file    Attends religious service: Not on file    Active member of club or organization: Not on file    Attends meetings of clubs or organizations: Not on file    Relationship status: Not on file  . Intimate partner violence:    Fear of current or ex partner: Not on file    Emotionally abused: Not on file    Physically abused: Not on file    Forced sexual activity: Not on file  Other Topics Concern  . Not on file  Social History Narrative   Pt recently moved to [place] from [place] to be near his son.   Retired.    Family History  Problem Relation Age of Onset  .  Asthma Mother 58  . Allergies Mother   . Ulcers Mother   . CVA Father 57  . Heart Problems Father   . Other Father        NEUROLOGIC ISSUES   . Other Brother 11       BICYCLE ACCIDENT  . Migraines Son   . Diabetes Mellitus I  Son     ROS- All systems are reviewed and negative except as per the HPI above  Physical Exam: Vitals:   11/09/17 1334  BP: (!) 166/88  Pulse: (!) 120  Weight: 76.7 kg  Height: 5' 7.9" (1.725 m)   Wt Readings from Last 3 Encounters:  11/09/17 76.7 kg  11/08/17 77.8 kg  10/18/17 78.9 kg    Labs: Lab Results  Component Value Date   NA 141 11/09/2017   K 5.0 11/09/2017   CL 103 11/09/2017   CO2 27 11/09/2017   GLUCOSE 136 (H) 11/09/2017   BUN 24 (H) 11/09/2017   CREATININE 1.48 (H) 11/09/2017   CALCIUM 9.9 11/09/2017   No results found for: INR No results found for: CHOL, HDL, LDLCALC, TRIG   GEN- The patient is well appearing, alert and oriented x 3 today.   Head- normocephalic, atraumatic Eyes-  Sclera clear, conjunctiva pink Ears- hearing intact Oropharynx- clear Neck- supple, no JVP Lymph- no cervical lymphadenopathy Lungs- Clear to ausculation bilaterally, normal work of breathing Heart- Rapid irregular rate and rhythm, no murmurs, rubs or gallops, PMI not laterally displaced GI- soft, NT, ND, + BS Extremities- no clubbing, cyanosis, or edema MS- no significant deformity or atrophy Skin- no rash or lesion Psych- euthymic mood, full affect Neuro- strength and sensation are intact  EKG-afib at 120 bpm, Ekg reviewed from yesterday and also appears to afib with RVR    Assessment and Plan: 1. Afib with RVR Pt before had typical atrial  flutter but clearly has afib today so  a flutter ablation would not address his current issues Discussed with Dr. [name] He will reload on amiodarone 200 mg bid  Add cardizem 120 mg a day for rate control He did miss a dose of eliquis  2 weeks ago He is leary of having a TEE for fear that it will  aggravate his throat vasculitis  Will see back early next week and he has been scheduled for cardioversion 9/26 if he does not return to SR with the above changes  2. Dyspnea F/u with pulmonology for tests they have planned  Butch Penny C. Ryna Beckstrom, [facility] [address] [place], [place] [address] [phone]

## 2017-11-10 DIAGNOSIS — M3130 Wegener's granulomatosis without renal involvement: Secondary | ICD-10-CM | POA: Diagnosis not present

## 2017-11-10 DIAGNOSIS — R2689 Other abnormalities of gait and mobility: Secondary | ICD-10-CM | POA: Diagnosis not present

## 2017-11-10 DIAGNOSIS — Z6824 Body mass index (BMI) 24.0-24.9, adult: Secondary | ICD-10-CM | POA: Diagnosis not present

## 2017-11-10 DIAGNOSIS — I4892 Unspecified atrial flutter: Secondary | ICD-10-CM | POA: Diagnosis not present

## 2017-11-10 DIAGNOSIS — R600 Localized edema: Secondary | ICD-10-CM | POA: Diagnosis not present

## 2017-11-13 DIAGNOSIS — D1801 Hemangioma of skin and subcutaneous tissue: Secondary | ICD-10-CM | POA: Diagnosis not present

## 2017-11-13 DIAGNOSIS — D229 Melanocytic nevi, unspecified: Secondary | ICD-10-CM | POA: Diagnosis not present

## 2017-11-13 DIAGNOSIS — C44519 Basal cell carcinoma of skin of other part of trunk: Secondary | ICD-10-CM | POA: Diagnosis not present

## 2017-11-13 DIAGNOSIS — D485 Neoplasm of uncertain behavior of skin: Secondary | ICD-10-CM | POA: Diagnosis not present

## 2017-11-13 DIAGNOSIS — L814 Other melanin hyperpigmentation: Secondary | ICD-10-CM | POA: Diagnosis not present

## 2017-11-13 DIAGNOSIS — C44719 Basal cell carcinoma of skin of left lower limb, including hip: Secondary | ICD-10-CM | POA: Diagnosis not present

## 2017-11-13 DIAGNOSIS — L821 Other seborrheic keratosis: Secondary | ICD-10-CM | POA: Diagnosis not present

## 2017-11-13 DIAGNOSIS — Z85820 Personal history of malignant melanoma of skin: Secondary | ICD-10-CM | POA: Diagnosis not present

## 2017-11-14 ENCOUNTER — Encounter (HOSPITAL_COMMUNITY): Payer: Self-pay | Admitting: Nurse Practitioner

## 2017-11-14 ENCOUNTER — Ambulatory Visit (HOSPITAL_COMMUNITY)
Admission: RE | Admit: 2017-11-14 | Discharge: 2017-11-14 | Disposition: A | Discharge status: Home / Self Care | Payer: Medicare Other | Source: Ambulatory Visit | Attending: Nurse Practitioner | Admitting: Nurse Practitioner

## 2017-11-14 DIAGNOSIS — I4891 Unspecified atrial fibrillation: Secondary | ICD-10-CM | POA: Insufficient documentation

## 2017-11-14 DIAGNOSIS — E039 Hypothyroidism, unspecified: Secondary | ICD-10-CM | POA: Diagnosis not present

## 2017-11-14 DIAGNOSIS — I4510 Unspecified right bundle-branch block: Secondary | ICD-10-CM | POA: Diagnosis not present

## 2017-11-14 MED ORDER — DILTIAZEM HCL ER COATED BEADS 120 MG PO CP24: 120.0000 mg | ORAL_CAPSULE | Freq: Two times a day (BID) | ORAL | 6 refills | Status: DC

## 2017-11-14 NOTE — Patient Instructions (Addendum)
Increase cardizem to 120mg  twice a day  Day of cardioversion --- no cardizem the morning of. Call with what to do with cardizem that evening.  Scheduling will call for appt with allred

## 2017-11-14 NOTE — Progress Notes (Signed)
Pt in for EKG after increasing amio to 200 mg bid and adding cardizem 120 mg daily. He has Afib with  RVR at 118 bpm and is still noting elevated heart rates at home. Comfortable at rest. Will increase Cardizem to 120 mg bid but hold am of cardioversion as he  has had HR in the low 60's in SR. BP today at 138/70. He is pending cardioversion 9/26 as he missed a dose of anticoagulation 2 weeks prior.

## 2017-11-15 ENCOUNTER — Other Ambulatory Visit (HOSPITAL_COMMUNITY): Payer: Self-pay | Admitting: *Deleted

## 2017-11-15 NOTE — Telephone Encounter (Signed)
Pt called requesting to decrease his dose of amiodarone back to 200 mg qd.  Last visit he was increased to 200 mg bid until cardioversion, but he is reporting weakness and fatigue.   Per Roderic Palau, NP he may cut down to 200 mg qd and keep scheduled dccv. Call if no improvement in symptoms

## 2017-11-23 ENCOUNTER — Encounter (HOSPITAL_COMMUNITY): Admission: RE | Payer: Self-pay | Source: Ambulatory Visit

## 2017-11-23 ENCOUNTER — Encounter (HOSPITAL_COMMUNITY): Payer: Self-pay | Admitting: Certified Registered Nurse Anesthetist

## 2017-11-23 ENCOUNTER — Ambulatory Visit (HOSPITAL_COMMUNITY)
Admission: RE | Admit: 2017-11-23 | Discharge: 2017-11-23 | Disposition: A | Discharge status: Home / Self Care | Payer: Medicare Other | Source: Ambulatory Visit | Attending: Nurse Practitioner | Admitting: Nurse Practitioner

## 2017-11-23 ENCOUNTER — Ambulatory Visit (HOSPITAL_COMMUNITY): Admission: RE | Admit: 2017-11-23 | Payer: Medicare Other | Source: Ambulatory Visit | Admitting: Cardiovascular Disease

## 2017-11-23 DIAGNOSIS — I4891 Unspecified atrial fibrillation: Secondary | ICD-10-CM | POA: Diagnosis not present

## 2017-11-23 SURGERY — CARDIOVERSION
Anesthesia: General

## 2017-11-23 NOTE — Progress Notes (Signed)
Pt in for Ekg for cardioversion this am, 2/2 reloading of amiodarone at 200 mg bid for 2 weeks with extra Cardizem for rate control. He has returned to [place] and felt improved but still has the issues with shortness of breath that is being further evaluated by pulmonology. He will stay on amiodarone at 200 mg qd and f/u with Dr. [name] 10/10.Ekg shows SR at 78 bpm, pr int 160 ms, qrs int 146 ms, qtc 481 ms.

## 2017-11-23 NOTE — Progress Notes (Signed)
Pt in for pre dccv EKG.  Pt back in NSR and dccv will be canceled.Marland Kitchen

## 2017-11-24 ENCOUNTER — Other Ambulatory Visit (HOSPITAL_COMMUNITY): Payer: Self-pay | Admitting: *Deleted

## 2017-11-24 MED ORDER — DILTIAZEM HCL ER COATED BEADS 120 MG PO CP24: 120.0000 mg | ORAL_CAPSULE | Freq: Two times a day (BID) | ORAL | 6 refills | Status: DC

## 2017-11-27 DIAGNOSIS — M300 Polyarteritis nodosa: Secondary | ICD-10-CM | POA: Diagnosis not present

## 2017-11-27 DIAGNOSIS — J324 Chronic pansinusitis: Secondary | ICD-10-CM | POA: Diagnosis not present

## 2017-11-27 DIAGNOSIS — M3130 Wegener's granulomatosis without renal involvement: Secondary | ICD-10-CM | POA: Diagnosis not present

## 2017-11-27 DIAGNOSIS — L929 Granulomatous disorder of the skin and subcutaneous tissue, unspecified: Secondary | ICD-10-CM | POA: Diagnosis not present

## 2017-11-28 ENCOUNTER — Ambulatory Visit (HOSPITAL_COMMUNITY)
Admission: RE | Admit: 2017-11-28 | Discharge: 2017-11-28 | Disposition: A | Discharge status: Home / Self Care | Payer: Medicare Other | Source: Ambulatory Visit | Attending: Nurse Practitioner | Admitting: Nurse Practitioner

## 2017-11-28 ENCOUNTER — Encounter (HOSPITAL_COMMUNITY): Payer: Self-pay | Admitting: Nurse Practitioner

## 2017-11-28 VITALS — BP 162/94 | HR 109 | Ht 67.0 in | Wt 167.0 lb

## 2017-11-28 DIAGNOSIS — I4892 Unspecified atrial flutter: Secondary | ICD-10-CM

## 2017-11-28 DIAGNOSIS — Z825 Family history of asthma and other chronic lower respiratory diseases: Secondary | ICD-10-CM | POA: Diagnosis not present

## 2017-11-28 DIAGNOSIS — Z7951 Long term (current) use of inhaled steroids: Secondary | ICD-10-CM | POA: Diagnosis not present

## 2017-11-28 DIAGNOSIS — Z91040 Latex allergy status: Secondary | ICD-10-CM | POA: Insufficient documentation

## 2017-11-28 DIAGNOSIS — Z7901 Long term (current) use of anticoagulants: Secondary | ICD-10-CM | POA: Insufficient documentation

## 2017-11-28 DIAGNOSIS — Z79899 Other long term (current) drug therapy: Secondary | ICD-10-CM | POA: Diagnosis not present

## 2017-11-28 DIAGNOSIS — Z87891 Personal history of nicotine dependence: Secondary | ICD-10-CM | POA: Insufficient documentation

## 2017-11-28 DIAGNOSIS — Z833 Family history of diabetes mellitus: Secondary | ICD-10-CM | POA: Insufficient documentation

## 2017-11-28 DIAGNOSIS — I776 Arteritis, unspecified: Secondary | ICD-10-CM | POA: Insufficient documentation

## 2017-11-28 DIAGNOSIS — K219 Gastro-esophageal reflux disease without esophagitis: Secondary | ICD-10-CM | POA: Insufficient documentation

## 2017-11-28 DIAGNOSIS — F419 Anxiety disorder, unspecified: Secondary | ICD-10-CM | POA: Diagnosis not present

## 2017-11-28 DIAGNOSIS — Z823 Family history of stroke: Secondary | ICD-10-CM | POA: Insufficient documentation

## 2017-11-28 DIAGNOSIS — Z8546 Personal history of malignant neoplasm of prostate: Secondary | ICD-10-CM | POA: Diagnosis not present

## 2017-11-28 DIAGNOSIS — Z8249 Family history of ischemic heart disease and other diseases of the circulatory system: Secondary | ICD-10-CM | POA: Diagnosis not present

## 2017-11-28 DIAGNOSIS — Z882 Allergy status to sulfonamides status: Secondary | ICD-10-CM | POA: Insufficient documentation

## 2017-11-28 DIAGNOSIS — I483 Typical atrial flutter: Secondary | ICD-10-CM | POA: Insufficient documentation

## 2017-11-28 DIAGNOSIS — I4891 Unspecified atrial fibrillation: Secondary | ICD-10-CM | POA: Insufficient documentation

## 2017-11-28 DIAGNOSIS — E785 Hyperlipidemia, unspecified: Secondary | ICD-10-CM | POA: Diagnosis not present

## 2017-11-28 DIAGNOSIS — J45909 Unspecified asthma, uncomplicated: Secondary | ICD-10-CM | POA: Diagnosis not present

## 2017-11-28 DIAGNOSIS — Z79890 Hormone replacement therapy (postmenopausal): Secondary | ICD-10-CM | POA: Diagnosis not present

## 2017-11-28 DIAGNOSIS — I10 Essential (primary) hypertension: Secondary | ICD-10-CM | POA: Insufficient documentation

## 2017-11-28 DIAGNOSIS — Z880 Allergy status to penicillin: Secondary | ICD-10-CM | POA: Diagnosis not present

## 2017-11-28 DIAGNOSIS — R0600 Dyspnea, unspecified: Secondary | ICD-10-CM | POA: Insufficient documentation

## 2017-11-28 NOTE — Patient Instructions (Signed)
Increase amiodarone to 200mg  twice a day until you see Dr. [name]

## 2017-11-28 NOTE — Progress Notes (Signed)
Primary Care Physician: Tisovec, Fransico Him, MD Referring Physician: Dr. Rosalyn Charters Tandon is a 84 y.o. male with a h/o typical atrial flutter, on amiodarone for evaluation in the afib clinic. He saw Dr. [name] in August and was c/o of shortness of breath. There was concern of amiodarone toxicity and pt asked to reduce amio to 100 mg a day. He had refused typical atrial flutter ablation in the past. He saw pulmonology yesterday and found to be in rapid atrial fibrillation. Dr. [name]  was notified  and he was asked to have f/u here. He increased his amio back to 200 mg yesterday and today. Pulmonology is planning to repeat PFT's when his heart rate is controlled. 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 Pulmonology  questioned if lungs were also involved contributing to increased shortness of breath.Pt is unaware of his RVR. He seems to be tolerating well.  F/u in afib clinic 10/1.Pt was scheduled to have a cardioversion but he went back into SR with increased dose of amiodarone to 200 mg bid.He was then instructed to lower to 200 mg daily. He was seen in ENTand has to have occasional  thickened dried nasal secretions removed,2/2 Wegner's disease. This was very painful and he noted he was in afib again this am.  Today, he denies symptoms of  chest pain, shortness of breath, orthopnea, PND, lower extremity edema, dizziness, presyncope, syncope, or neurologic sequela. The patient is tolerating medications without difficulties and is otherwise without complaint today.   Past Medical History:  Diagnosis Date  . Anxiety   . Asthma   . Asthmatic bronchitis   . Atrial flutter (HCC)    chads2vasc score is 3  . Ehrlichiosis 3762  . GERD (gastroesophageal reflux disease)   . Granulomatosis with polyangiitis ([place])   . HTN (hypertension)   . Hyperlipidemia   . Paroxysmal atrial fibrillation (HCC)   . Prostate cancer ([place])   . Seasonal allergies    Past Surgical  History:  Procedure Laterality Date  . BASAL CELL CARCINOMA EXCISION  2013   EYELID  . HERNIA REPAIR Bilateral 2000, 2002  . KNEE ARTHROSCOPY  2000  . prostectomy  1995   RADICAL  . TONSILLECTOMY  1940    Current Outpatient Medications  Medication Sig Dispense Refill  . albuterol (PROVENTIL HFA;VENTOLIN HFA) 108 (90 Base) MCG/ACT inhaler Inhale 1-2 puffs into the lungs every 6 (six) hours as needed for wheezing or shortness of breath. 3 Inhaler 3  . ALPRAZolam (XANAX) 0.25 MG tablet Take 0.125 mg by mouth at bedtime as needed for sleep.     Marland Kitchen amiodarone (PACERONE) 200 MG tablet Take 200 mg by mouth 2 (two) times daily.    Marland Kitchen apixaban (ELIQUIS) 5 MG TABS tablet Take 1 tablet (5 mg total) by mouth 2 (two) times daily. 180 tablet 3  . bimatoprost (LUMIGAN) 0.03 % ophthalmic solution Place 1 drop into both eyes at bedtime.     . budesonide-formoterol (SYMBICORT) 160-4.5 MCG/ACT inhaler Inhale 2 puffs into the lungs 2 (two) times daily. 1 Inhaler 5  . calcium carbonate (OSCAL) 1500 (600 Ca) MG TABS tablet Take 600 mg of elemental calcium by mouth 2 (two) times daily with a meal.    . diltiazem (CARDIZEM CD) 120 MG 24 hr capsule Take 1 capsule (120 mg total) by mouth 2 (two) times daily. 60 capsule 6  . diphenhydrAMINE (BENADRYL) 25 MG tablet Take 50 mg by mouth at bedtime.     Marland Kitchen  folic acid (FOLVITE) 1 MG tablet Take 1 mg by mouth daily.    Marland Kitchen levocetirizine (XYZAL) 5 MG tablet Take 1 tablet (5 mg total) by mouth every evening. 93 tablet 3  . levothyroxine (SYNTHROID, LEVOTHROID) 75 MCG tablet Take 75 mcg by mouth daily before breakfast.    . methotrexate (RHEUMATREX) 2.5 MG tablet Take 25 mg by mouth every Wednesday. Caution:Chemotherapy. Protect from light.     . montelukast (SINGULAIR) 10 MG tablet Take 10 mg at bedtime by mouth.    . Multiple Vitamin (MULTIVITAMIN WITH MINERALS) TABS tablet Take 1 tablet daily by mouth.    . Multiple Vitamins-Minerals (OCUVITE PRESERVISION PO) Take 1 tablet 2  (two) times daily by mouth.    Mckinley Jewel Dimesylate (RHOPRESSA) 0.02 % SOLN Place 1 drop into the right eye daily.    Marland Kitchen omeprazole (PRILOSEC) 40 MG capsule Take 40 mg daily by mouth.    . predniSONE (DELTASONE) 5 MG tablet Take 7.5 mg by mouth daily with breakfast.     . ranitidine (ZANTAC) 150 MG tablet Take 150 mg by mouth daily as needed for heartburn.     . simvastatin (ZOCOR) 10 MG tablet Take 10 mg by mouth every evening.     . triamcinolone (NASACORT ALLERGY 24HR) 55 MCG/ACT AERO nasal inhaler Place 2 sprays 2 (two) times daily into the nose.     No current facility-administered medications for this encounter.     Allergies  Allergen Reactions  . Penicillins Hives    Has patient had a PCN reaction causing immediate rash, facial/tongue/throat swelling, SOB or lightheadedness with hypotension: No Has patient had a PCN reaction causing severe rash involving mucus membranes or skin necrosis: No Has patient had a PCN reaction that required hospitalization: No Has patient had a PCN reaction occurring within the last 10 years: Yes If all of the above answers are "NO", then may proceed with Cephalosporin use.   . Sulfa Antibiotics Other (See Comments)    Unknown to patient   . Latex Hives and Rash    Social History   Socioeconomic History  . Marital status: Married    Spouse name: [name]  . Number of children: 2  . Years of education: Not on file  . Highest education level: Not on file  Occupational History  . Occupation: RETIRED  Social Needs  . Financial resource strain: Not on file  . Food insecurity:    Worry: Not on file    Inability: Not on file  . Transportation needs:    Medical: Not on file    Non-medical: Not on file  Tobacco Use  . Smoking status: Former Smoker    Packs/day: 0.50    Years: 4.00    Pack years: 2.00    Types: Cigarettes    Last attempt to quit: 02/28/1961    Years since quitting: 56.7  . Smokeless tobacco: Never Used  Substance and  Sexual Activity  . Alcohol use: Yes  . Drug use: No  . Sexual activity: Not on file  Lifestyle  . Physical activity:    Days per week: Not on file    Minutes per session: Not on file  . Stress: Not on file  Relationships  . Social connections:    Talks on phone: Not on file    Gets together: Not on file    Attends religious service: Not on file    Active member of club or organization: Not on file    Attends meetings  of clubs or organizations: Not on file    Relationship status: Not on file  . Intimate partner violence:    Fear of current or ex partner: Not on file    Emotionally abused: Not on file    Physically abused: Not on file    Forced sexual activity: Not on file  Other Topics Concern  . Not on file  Social History Narrative   Pt recently moved to [place] from [place] to be near his son.   Retired.    Family History  Problem Relation Age of Onset  . Asthma Mother 23  . Allergies Mother   . Ulcers Mother   . CVA Father 16  . Heart Problems Father   . Other Father        NEUROLOGIC ISSUES   . Other Brother 11       BICYCLE ACCIDENT  . Migraines Son   . Diabetes Mellitus I Son     ROS- All systems are reviewed and negative except as per the HPI above  Physical Exam: Vitals:   11/28/17 1347  BP: (!) 162/94  Pulse: (!) 109  SpO2: 95%  Weight: 75.8 kg  Height: 5\' 7"  (1.702 m)   Wt Readings from Last 3 Encounters:  11/28/17 75.8 kg  11/23/17 74.8 kg  11/14/17 76.7 kg    Labs: Lab Results  Component Value Date   NA 141 11/09/2017   K 5.0 11/09/2017   CL 103 11/09/2017   CO2 27 11/09/2017   GLUCOSE 136 (H) 11/09/2017   BUN 24 (H) 11/09/2017   CREATININE 1.48 (H) 11/09/2017   CALCIUM 9.9 11/09/2017   No results found for: INR No results found for: CHOL, HDL, LDLCALC, TRIG   GEN- The patient is well appearing, alert and oriented x 3 today.   Head- normocephalic, atraumatic Eyes-  Sclera clear, conjunctiva pink Ears- hearing  intact Oropharynx- clear Neck- supple, no JVP Lymph- no cervical lymphadenopathy Lungs- Clear to ausculation bilaterally, normal work of breathing Heart- Rapid irregular rate and rhythm, no murmurs, rubs or gallops, PMI not laterally displaced GI- soft, NT, ND, + BS Extremities- no clubbing, cyanosis, or edema MS- no significant deformity or atrophy Skin- no rash or lesion Psych- euthymic mood, full affect Neuro- strength and sensation are intact  EKG-atrial  flutter at 109 bpm, qrs int 136 ms, qtc 514 ms, RBBB    Assessment and Plan: 1. Afib/flutter with RVR Pt before had typical atrial  flutter but clearly has  had recent afib so  A flutter ablation would not completely address his current issues He did convert last week avoiding cardioversion with extra amiodarone, had just reduced to 200 mg daily He will reload on amiodarone 200 mg bid  Continue cardizem 120 mg bid for rate control  2. Dyspnea/ small vessel vasculitis F/u with pulmonology for tests they have planned  F/u with Dr. [name] 10/10  Geroge Baseman. Edras Wilford, [facility] [address] [place], [place] [address] [phone]

## 2017-12-01 ENCOUNTER — Encounter: Payer: Self-pay | Admitting: Nurse Practitioner

## 2017-12-01 ENCOUNTER — Ambulatory Visit (INDEPENDENT_AMBULATORY_CARE_PROVIDER_SITE_OTHER): Payer: Medicare Other | Admitting: Nurse Practitioner

## 2017-12-01 ENCOUNTER — Other Ambulatory Visit (INDEPENDENT_AMBULATORY_CARE_PROVIDER_SITE_OTHER): Payer: Medicare Other

## 2017-12-01 VITALS — BP 130/70 | HR 111 | Ht 67.9 in | Wt 164.3 lb

## 2017-12-01 DIAGNOSIS — R0602 Shortness of breath: Secondary | ICD-10-CM | POA: Diagnosis not present

## 2017-12-01 LAB — BASIC METABOLIC PANEL
BUN: 27 mg/dL — ABNORMAL HIGH (ref 6–23)
CO2: 27 mEq/L (ref 19–32)
Calcium: 10.2 mg/dL (ref 8.4–10.5)
Chloride: 103 mEq/L (ref 96–112)
Creatinine, Ser: 1.63 mg/dL — ABNORMAL HIGH (ref 0.40–1.50)
GFR: 43.12 mL/min — ABNORMAL LOW (ref >60.00)
Glucose, Bld: 123 mg/dL — ABNORMAL HIGH (ref 70–99)
Potassium: 4.3 mEq/L (ref 3.5–5.1)
Sodium: 139 mEq/L (ref 135–145)

## 2017-12-01 LAB — D-DIMER, QUANTITATIVE (NOT AT ARMC): D-Dimer, Quant: 0.67 mcg/mL FEU — ABNORMAL HIGH (ref <0.50)

## 2017-12-01 NOTE — Patient Instructions (Signed)
Patient has not been able to schedule PFT - will do spirometry in office today Will check D dimer and call with results Continue current medications Keep follow up with cardiology Follow up with Dr. Lake Bells in 2 weeks or sooner if needed

## 2017-12-01 NOTE — Addendum Note (Signed)
Addended by: Nena Polio on: 12/01/2017 04:49 PM   Modules accepted: Orders

## 2017-12-01 NOTE — Addendum Note (Signed)
Addended by: Nena Polio on: 12/01/2017 04:22 PM   Modules accepted: Orders

## 2017-12-01 NOTE — Assessment & Plan Note (Signed)
Patient Instructions  Patient has not been able to schedule PFT - will do spirometry in office today Will check D dimer and call with results Continue current medications Keep follow up with cardiology Follow up with Dr. Lake Bells in 2 weeks or sooner if needed

## 2017-12-01 NOTE — Progress Notes (Signed)
@Patient  ID: Chad Morrison, male    DOB: 07/14/1932, 84 y.o.   MRN: [id number]  Chief Complaint  Patient presents with  . Shortness of Breath    Referring provider: Tisovec, Fransico Him, MD  HPI  84 year old former smoker followed by Dr. Lake Bells for COPD with asthma and allergic rhinitis.  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   Tests: ECHO 10/23/17>> Moderate AS, EF 60-65% Echo 09/08/16 >> mild LVH, EF 60 to 65% PFT 05/24/17 >> FEV1 2.44 (88%), FEV1% 65, TLC 7.41 (104%), DLCO 70% Doppler legs 07/03/17 >> no DVT; Baker's cyst Rt popliteal fossa  OV 12/01/17 - acute shortness of breath Patient presents for ongoing shortness of breath. He was seen by me on 11/08/17 for shortness of breath and was found to be in rapid atrial fibrillation. He was refered to the A-Fib clinic and was scheduled for cardioversion, but he went back into sinus rhythm. On 11-28-17 he followed up with cardiology and was found to be in A flutter with RVR. He continues on amiodarone. Patient states that while he was in NSR he was still short of breath. He is requesting a CT scan be done for further evaluation. Dr. Lake Bells had planned to do a PFT, but with patient's recent history he has not been able to get this scheduled. Spirometry in office today was normal. Patient denies any cough, wheezing, or edema.    Allergies  Allergen Reactions  . Penicillins Hives    Has patient had a PCN reaction causing immediate rash, facial/tongue/throat swelling, SOB or lightheadedness with hypotension: No Has patient had a PCN reaction causing severe rash involving mucus membranes or skin necrosis: No Has patient had a PCN reaction that required hospitalization: No Has patient had a PCN reaction occurring within the last 10 years: Yes If all of the above answers are "NO", then may proceed with Cephalosporin use.   . Sulfa Antibiotics Other (See Comments)    Unknown to patient   . Latex Hives and Rash     Immunization History  Administered Date(s) Administered  . Influenza, High Dose Seasonal PF 12/27/2016  . Pneumococcal Conjugate-13 01/04/2016  . Pneumococcal Polysaccharide-23 01/03/2014  . Pneumococcal-Unspecified 10/31/2016    Past Medical History:  Diagnosis Date  . Anxiety   . Asthma   . Asthmatic bronchitis   . Atrial flutter (HCC)    chads2vasc score is 3  . Ehrlichiosis 1540  . GERD (gastroesophageal reflux disease)   . 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 HTN (hypertension)   . Hyperlipidemia   . Paroxysmal atrial fibrillation (HCC)   . Prostate cancer ([place])   . Seasonal allergies     Tobacco History: Social History   Tobacco Use  Smoking Status Former Smoker  . Packs/day: 0.50  . Years: 4.00  . Pack years: 2.00  . Types: Cigarettes  . Last attempt to quit: 02/28/1961  . Years since quitting: 56.7  Smokeless Tobacco Never Used   Counseling given: Yes   Outpatient Encounter Medications as of 12/01/2017  Medication Sig  . albuterol (PROVENTIL HFA;VENTOLIN HFA) 108 (90 Base) MCG/ACT inhaler Inhale 1-2 puffs into the lungs every 6 (six) hours as needed for wheezing or shortness of breath.  . ALPRAZolam (XANAX) 0.25 MG tablet Take 0.125 mg by mouth at bedtime as needed for sleep.   Marland Kitchen amiodarone (PACERONE) 200 MG tablet Take 200 mg by mouth 2 (two) times daily.  Marland Kitchen apixaban (ELIQUIS) 5 MG TABS tablet Take 1  tablet (5 mg total) by mouth 2 (two) times daily.  . bimatoprost (LUMIGAN) 0.03 % ophthalmic solution Place 1 drop into both eyes at bedtime.   . budesonide-formoterol (SYMBICORT) 160-4.5 MCG/ACT inhaler Inhale 2 puffs into the lungs 2 (two) times daily.  . calcium carbonate (OSCAL) 1500 (600 Ca) MG TABS tablet Take 600 mg of elemental calcium by mouth 2 (two) times daily with a meal.  . diltiazem (CARDIZEM CD) 120 MG 24 hr capsule Take 1 capsule (120 mg total) by mouth 2 (two) times daily.  . diphenhydrAMINE (BENADRYL) 25 MG tablet Take 50 mg by mouth  at bedtime.   . folic acid (FOLVITE) 1 MG tablet Take 1 mg by mouth daily.  Marland Kitchen levocetirizine (XYZAL) 5 MG tablet Take 1 tablet (5 mg total) by mouth every evening.  Marland Kitchen levothyroxine (SYNTHROID, LEVOTHROID) 75 MCG tablet Take 75 mcg by mouth daily before breakfast.  . methotrexate (RHEUMATREX) 2.5 MG tablet Take 25 mg by mouth every Wednesday. Caution:Chemotherapy. Protect from light.   . montelukast (SINGULAIR) 10 MG tablet Take 10 mg at bedtime by mouth.  . Multiple Vitamin (MULTIVITAMIN WITH MINERALS) TABS tablet Take 1 tablet daily by mouth.  . Multiple Vitamins-Minerals (OCUVITE PRESERVISION PO) Take 1 tablet 2 (two) times daily by mouth.  Mckinley Jewel Dimesylate (RHOPRESSA) 0.02 % SOLN Place 1 drop into the right eye daily.  Marland Kitchen omeprazole (PRILOSEC) 40 MG capsule Take 40 mg daily by mouth.  . predniSONE (DELTASONE) 5 MG tablet Take 7.5 mg by mouth daily with breakfast.   . ranitidine (ZANTAC) 150 MG tablet Take 150 mg by mouth daily as needed for heartburn.   . simvastatin (ZOCOR) 10 MG tablet Take 10 mg by mouth every evening.   Marland Kitchen SPIRIVA RESPIMAT 2.5 MCG/ACT AERS Inhale 2 puffs into the lungs daily.  Marland Kitchen triamcinolone (NASACORT ALLERGY 24HR) 55 MCG/ACT AERO nasal inhaler Place 2 sprays 2 (two) times daily into the nose.   No facility-administered encounter medications on file as of 12/01/2017.      Review of Systems  Review of Systems  Constitutional: Negative.  Negative for chills and fever.  HENT: Negative.  Negative for congestion and sinus pain.   Respiratory: Positive for shortness of breath. Negative for cough.   Cardiovascular: Negative.  Negative for chest pain, palpitations and leg swelling.  Gastrointestinal: Negative.   Allergic/Immunologic: Negative.   Neurological: Negative.   Psychiatric/Behavioral: Negative.        Physical Exam  BP 130/70 (BP Location: Right Arm, Patient Position: Sitting, Cuff Size: Normal)   Pulse (!) 111   Ht 5' 7.9" (1.725 m)   Wt 164 lb  4.8 oz (74.5 kg)   SpO2 94%   BMI 25.06 kg/m   Wt Readings from Last 5 Encounters:  12/01/17 164 lb 4.8 oz (74.5 kg)  11/28/17 167 lb (75.8 kg)  11/23/17 165 lb (74.8 kg)  11/14/17 169 lb 3.2 oz (76.7 kg)  11/09/17 169 lb (76.7 kg)     Physical Exam  Constitutional: He is oriented to person, place, and time. He appears well-developed and well-nourished. No distress.  Cardiovascular: Normal rate and regular rhythm.  Pulmonary/Chest: Effort normal and breath sounds normal. He has no wheezes. He has no rhonchi. He has no rales.  Musculoskeletal:       Right lower leg: He exhibits no edema.  Neurological: He is alert and oriented to person, place, and time.  Skin: Skin is warm and dry.  Psychiatric: He has a normal mood and  affect.  Nursing note and vitals reviewed.     Assessment & Plan:    Shortness of breath Patient Instructions  Patient has not been able to schedule PFT - will do spirometry in office today Will check D dimer and call with results Continue current medications Keep follow up with cardiology Follow up with Dr. Lake Bells in 2 weeks or sooner if needed       Fenton Foy, NP 12/01/2017

## 2017-12-04 ENCOUNTER — Other Ambulatory Visit: Payer: Self-pay | Admitting: Nurse Practitioner

## 2017-12-04 ENCOUNTER — Encounter (HOSPITAL_COMMUNITY)
Admission: RE | Admit: 2017-12-04 | Discharge: 2017-12-04 | Disposition: A | Discharge status: Home / Self Care | Payer: Medicare Other | Source: Ambulatory Visit | Attending: Nurse Practitioner | Admitting: Nurse Practitioner

## 2017-12-04 ENCOUNTER — Telehealth: Payer: Self-pay | Admitting: Nurse Practitioner

## 2017-12-04 ENCOUNTER — Ambulatory Visit (HOSPITAL_COMMUNITY)
Admission: RE | Admit: 2017-12-04 | Discharge: 2017-12-04 | Disposition: A | Discharge status: Home / Self Care | Payer: Medicare Other | Source: Ambulatory Visit | Attending: Nurse Practitioner | Admitting: Nurse Practitioner

## 2017-12-04 DIAGNOSIS — J189 Pneumonia, unspecified organism: Secondary | ICD-10-CM | POA: Diagnosis not present

## 2017-12-04 DIAGNOSIS — I700 Atherosclerosis of aorta: Secondary | ICD-10-CM | POA: Diagnosis not present

## 2017-12-04 DIAGNOSIS — J440 Chronic obstructive pulmonary disease with acute lower respiratory infection: Secondary | ICD-10-CM | POA: Insufficient documentation

## 2017-12-04 DIAGNOSIS — R0602 Shortness of breath: Secondary | ICD-10-CM

## 2017-12-04 DIAGNOSIS — J449 Chronic obstructive pulmonary disease, unspecified: Secondary | ICD-10-CM | POA: Diagnosis not present

## 2017-12-04 MED ORDER — DOXYCYCLINE HYCLATE 100 MG PO TABS: 100.0000 mg | ORAL_TABLET | Freq: Two times a day (BID) | ORAL | 0 refills | Status: DC

## 2017-12-04 MED ORDER — TECHNETIUM TO 99M ALBUMIN AGGREGATED
4.0000 | Freq: Once | INTRAVENOUS | Status: AC | PRN
  Administered 2017-12-04: 4 via INTRAVENOUS

## 2017-12-04 MED ORDER — TECHNETIUM TC 99M DIETHYLENETRIAME-PENTAACETIC ACID
30.0000 | Freq: Once | INTRAVENOUS | Status: AC | PRN
  Administered 2017-12-04: 30 via RESPIRATORY_TRACT

## 2017-12-04 NOTE — Telephone Encounter (Signed)
Called and spoke to patient, confirmed pharmacy, sent to correct pharmacy, called [facility] mail order and spoke with Jodie and cancelled previous order. Patient voiced understanding. Nothing further is needed at this time.

## 2017-12-04 NOTE — Telephone Encounter (Signed)
Notes recorded by Fenton Foy, NP on 12/04/2017 at 3:36 PM EDT Please call to let patient know that his x ray showed pneumonia developing. I will send him in an antibiotic and we need to follow up in 2 weeks. His VQ scan was stable - no PE. --------------------------------------------------------------------- Spoke with pt. He is aware of results. ROV has been scheduled on 12/18/17 at 11am. Nothing further was needed.

## 2017-12-06 ENCOUNTER — Telehealth: Payer: Self-pay | Admitting: Pulmonary Disease

## 2017-12-06 NOTE — Telephone Encounter (Signed)
Called and spoke with pt who stated he was returning a call from yesterday. I clarified everything with pt's doxycycline Rx and pt stated he did pick it up.  Also stated to pt that [place] did call [facility] to cancel the doxy Rx that was originally sent there.  Pt expressed understanding. Nothing further needed.

## 2017-12-07 ENCOUNTER — Encounter: Payer: Self-pay | Admitting: Internal Medicine

## 2017-12-07 ENCOUNTER — Ambulatory Visit (INDEPENDENT_AMBULATORY_CARE_PROVIDER_SITE_OTHER): Payer: Medicare Other | Admitting: Internal Medicine

## 2017-12-07 VITALS — BP 156/74 | HR 66 | Ht 69.0 in | Wt 162.4 lb

## 2017-12-07 DIAGNOSIS — I4891 Unspecified atrial fibrillation: Secondary | ICD-10-CM | POA: Diagnosis not present

## 2017-12-07 DIAGNOSIS — I6521 Occlusion and stenosis of right carotid artery: Secondary | ICD-10-CM | POA: Diagnosis not present

## 2017-12-07 DIAGNOSIS — I483 Typical atrial flutter: Secondary | ICD-10-CM | POA: Diagnosis not present

## 2017-12-07 MED ORDER — DRONEDARONE HCL 400 MG PO TABS: 400.0000 mg | ORAL_TABLET | Freq: Two times a day (BID) | ORAL | 11 refills | Status: DC

## 2017-12-07 NOTE — Progress Notes (Signed)
PCP: Haywood Pao, MD   Primary EP: Dr [name]  Chad Morrison is a 84 y.o. male who presents today for routine electrophysiology followup. He recently presented to pulmonary and was found to have afib with RVR.  His amiodarone had been reduced to 100mg  daily.  His amiodarone was subsequently increased and he was seen in the AF clinic.  He converted to sinus and did not require cardioversion.  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   Today, he denies symptoms of palpitations, chest pain,  lower extremity edema, dizziness, presyncope, or syncope.  The patient is otherwise without complaint today.   Past Medical History:  Diagnosis Date  . Anxiety   . Asthma   . Asthmatic bronchitis   . Atrial flutter (HCC)    chads2vasc score is 3  . Ehrlichiosis 8469  . GERD (gastroesophageal reflux disease)   . 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 HTN (hypertension)   . Hyperlipidemia   . Paroxysmal atrial fibrillation (HCC)   . Prostate cancer ([place])   . Seasonal allergies    Past Surgical History:  Procedure Laterality Date  . BASAL CELL CARCINOMA EXCISION  2013   EYELID  . HERNIA REPAIR Bilateral 2000, 2002  . KNEE ARTHROSCOPY  2000  . prostectomy  1995   RADICAL  . TONSILLECTOMY  1940    ROS- all systems are reviewed and negatives except as per HPI above  Current Outpatient Medications  Medication Sig Dispense Refill  . albuterol (PROVENTIL HFA;VENTOLIN HFA) 108 (90 Base) MCG/ACT inhaler Inhale 1-2 puffs into the lungs every 6 (six) hours as needed for wheezing or shortness of breath. 3 Inhaler 3  . ALPRAZolam (XANAX) 0.25 MG tablet Take 0.125 mg by mouth at bedtime as needed for sleep.     Marland Kitchen amiodarone (PACERONE) 200 MG tablet Take 200 mg by mouth 2 (two) times daily.    Marland Kitchen apixaban (ELIQUIS) 5 MG TABS tablet Take 1 tablet (5 mg total) by mouth 2 (two) times daily. 180 tablet 3  . bimatoprost (LUMIGAN) 0.03 %  ophthalmic solution Place 1 drop into both eyes at bedtime.     . budesonide-formoterol (SYMBICORT) 160-4.5 MCG/ACT inhaler Inhale 2 puffs into the lungs 2 (two) times daily. 1 Inhaler 5  . calcium carbonate (OSCAL) 1500 (600 Ca) MG TABS tablet Take 600 mg of elemental calcium by mouth 2 (two) times daily with a meal.    . diltiazem (CARDIZEM CD) 120 MG 24 hr capsule Take 1 capsule (120 mg total) by mouth 2 (two) times daily. 60 capsule 6  . diphenhydrAMINE (BENADRYL) 25 MG tablet Take 50 mg by mouth at bedtime.     Marland Kitchen doxycycline (VIBRA-TABS) 100 MG tablet Take 1 tablet (100 mg total) by mouth 2 (two) times daily. 14 tablet 0  . folic acid (FOLVITE) 1 MG tablet Take 1 mg by mouth daily.    Marland Kitchen levocetirizine (XYZAL) 5 MG tablet Take 1 tablet (5 mg total) by mouth every evening. 93 tablet 3  . levothyroxine (SYNTHROID, LEVOTHROID) 75 MCG tablet Take 75 mcg by mouth daily before breakfast.    . methotrexate (RHEUMATREX) 2.5 MG tablet Take 25 mg by mouth every Wednesday. Caution:Chemotherapy. Protect from light.     . montelukast (SINGULAIR) 10 MG tablet Take 10 mg at bedtime by mouth.    . Multiple Vitamin (MULTIVITAMIN WITH MINERALS) TABS tablet Take 1 tablet daily by mouth.    Marland Kitchen  Multiple Vitamins-Minerals (OCUVITE PRESERVISION PO) Take 1 tablet 2 (two) times daily by mouth.    Mckinley Jewel Dimesylate (RHOPRESSA) 0.02 % SOLN Place 1 drop into the right eye daily.    Marland Kitchen omeprazole (PRILOSEC) 40 MG capsule Take 40 mg daily by mouth.    . predniSONE (DELTASONE) 5 MG tablet Take 7.5 mg by mouth daily with breakfast.     . ranitidine (ZANTAC) 150 MG tablet Take 150 mg by mouth daily as needed for heartburn.     . simvastatin (ZOCOR) 10 MG tablet Take 10 mg by mouth every evening.     Marland Kitchen SPIRIVA RESPIMAT 2.5 MCG/ACT AERS Inhale 2 puffs into the lungs daily.  5  . triamcinolone (NASACORT ALLERGY 24HR) 55 MCG/ACT AERO nasal inhaler Place 2 sprays 2 (two) times daily into the nose.     No current  facility-administered medications for this visit.     Physical Exam: Vitals:   12/07/17 0937  BP: (!) 156/74  Pulse: 66  SpO2: 96%  Weight: 162 lb 6.4 oz (73.7 kg)  Height: 5\' 9"  (1.753 m)    GEN- The patient is well appearing, alert and oriented x 3 today.   Head- normocephalic, atraumatic Eyes-  Sclera clear, conjunctiva pink Ears- hearing intact Oropharynx- clear Lungs- Clear to ausculation bilaterally, normal work of breathing Heart- Regular rate and rhythm, 2/6 SEM LUSB which is mid peaking GI- soft, NT, ND, + BS Extremities- no clubbing, cyanosis, or edema  Wt Readings from Last 3 Encounters:  12/07/17 162 lb 6.4 oz (73.7 kg)  12/01/17 164 lb 4.8 oz (74.5 kg)  11/28/17 167 lb (75.8 kg)    EKG tracing ordered today is personally reviewed and shows sinus rhythm, RBBB, Qtc 499 msec  Echo 10/23/17 reveals EF 60%, moderate AS, severe LA enlargement (volume 69ml), mild MR  Assessment and Plan:  1. Typical atrial flutter, persistent atrial fibrillation Doing well with amiodarone however I worry about the long term risks.  He has severe LA enlargement and chronic lung disease for which I also worry about both success and risks of ablation.  We talked about options at length today.  Unfortunately, is options are quite limited. At this time,  I will stop amiodarone and start multaq 400mg  BID Return in 4 weeks to AF clinic I will see in 3 months.  2. SOB Followed by pulmonary  Appears to have vasculitis  Return in 4 weeks to AF clinic I will see in 3 months.  Thompson Grayer MD, [facility] 12/07/2017 10:06 AM

## 2017-12-07 NOTE — Patient Instructions (Addendum)
Medication Instructions:  Your physician has recommended you make the following change in your medication:   1.  Stop taking amiodarone  2.  Tomorrow morning start taking Multaq 400 mg - One tablet by mouth twice a day with food  Labwork: None ordered.  Testing/Procedures: None ordered.  Follow-Up:  Your physician wants you to follow-up in: 4 weeks with Roderic Palau NP at the afib clinic.  Your physician wants you to follow-up in: 3 months with Dr. [name].      Any Other Special Instructions Will Be Listed Below (If Applicable).  If you need a refill on your cardiac medications before your next appointment, please call your pharmacy.   Dronedarone tablets What is this medicine? DRONEDARONE (droe NE da rone) is an antiarrhythmic drug. It helps make your heart beat regularly. This medicine may be used for other purposes; ask your health care provider or pharmacist if you have questions. COMMON BRAND NAME(S): Multaq What should I tell my health care provider before I take this medicine? They need to know if you have any of these conditions: -heart failure -history of irregular heartbeat -liver disease -liver or lung problems with the past use of amiodarone -low levels of magnesium in the blood -low levels of potassium in the blood -other heart disease -an unusual or allergic reaction to dronedarone, other medicines, foods, dyes, or preservatives -pregnant or trying to get pregnant -breast-feeding How should I use this medicine? Take this medicine by mouth with a glass of water. Follow the directions on the prescription label. Take one tablet with the morning meal and one tablet with the evening meal. Do not take your medicine more often than directed. Do not stop taking except on the advice of your doctor or health care professional. A special MedGuide will be given to you by the pharmacist with each prescription and refill. Be sure to read this information carefully each  time. Talk to your pediatrician regarding the use of this medicine in children. Special care may be needed. Overdosage: If you think you have taken too much of this medicine contact a poison control center or emergency room at once. NOTE: This medicine is only for you. Do not share this medicine with others. What if I miss a dose? If you miss a dose, take it as soon as you can. If it is almost time for your next dose, take only that dose. Do not take double or extra doses. What may interact with this medicine? Do not take this medicine with any of the following medications: -arsenic trioxide -certain antibiotics like clarithromycin, erythromycin, pentamidine, telithromycin, troleandomycin -certain medicines for depression like tricyclic antidepressants -certain medicines for fungal infections like fluconazole, itraconazole, ketoconazole, posaconazole, voriconazole -certain medicines for irregular heart beat like amiodarone, disopyramide, dofetilide, flecainide, ibutilide, quinidine, propafenone, sotalol -certain medicines for malaria like chloroquine, halofantrine -cisapride -cyclosporine -droperidol -haloperidol -methadone -other medicines that prolong the QT interval (cause an abnormal heart rhythm) -pimozide -nefazodone -phenothiazines like chlorpromazine, mesoridazine, prochlorperazine, thioridazine -ritonavir -ziprasidone This medicine may also interact with the following medications: -certain medicines for blood pressure, heart disease, or irregular heart beat like diltiazem, metoprolol, propranolol, verapamil -certain medicines for cholesterol like atorvastatin, lovastatin, simvastatin -certain medicines for seizures like carbamazepine, phenobarbital, phenytoin -digoxin -grapefruit juice -rifampin -sirolimus -St. John's Wort -tacrolimus This list may not describe all possible interactions. Give your health care provider a list of all the medicines, herbs, non-prescription  drugs, or dietary supplements you use. Also tell them if you smoke, drink alcohol,  or use illegal drugs. Some items may interact with your medicine. What should I watch for while using this medicine? Your condition will be monitored closely when you first begin therapy. Often, this drug is first started in a hospital or other monitored health care setting. Once you are on maintenance therapy, visit your doctor or health care professional for regular checks on your progress. Because your condition and use of this medicine carry some risk, it is a good idea to carry an identification card, necklace or bracelet with details of your condition, medications, and doctor or health care professional. Dennis Bast may get drowsy or dizzy. Do not drive, use machinery, or do anything that needs mental alertness until you know how this medicine affects you. Do not stand or sit up quickly, especially if you are an older patient. This reduces the risk of dizzy or fainting spells. What side effects may I notice from receiving this medicine? Side effects that you should report to your doctor or health care professional as soon as possible: -allergic reactions like skin rash, itching or hives, swelling of the face, lips, or tongue -breathing problems -cough -dark urine -fast, irregular heartbeat -general ill feeling or flu-like symptoms -light-colored stools -loss of appetite, nausea -right upper belly pain -slow heartbeat -stomach pain -swelling of the legs or ankles -unusually weak or tired -weight gain -yellowing of the eyes or skin Side effects that usually do not require medical attention (report to your doctor or health care professional if they continue or are bothersome): -nausea -vomiting -stomach pain This list may not describe all possible side effects. Call your doctor for medical advice about side effects. You may report side effects to FDA at 1-800-FDA-1088. Where should I keep my medicine? Keep out of  the reach of children. Store at room temperature between 15 and 30 degrees C (59 and 86 degrees F). Throw away any unused medicine after the expiration date. NOTE: This sheet is a summary. It may not cover all possible information. If you have questions about this medicine, talk to your doctor, pharmacist, or health care provider.  2018 Elsevier/Gold Standard (2015-03-19 12:43:06)

## 2017-12-08 NOTE — Progress Notes (Signed)
Reviewed, agree; recommend HRCT, request sent to office staff for this.

## 2017-12-08 NOTE — Addendum Note (Signed)
Addended by: Dolores Lory on: 12/08/2017 04:33 PM   Modules accepted: Orders

## 2017-12-13 ENCOUNTER — Telehealth: Payer: Self-pay | Admitting: Nurse Practitioner

## 2017-12-13 MED ORDER — DOXYCYCLINE HYCLATE 100 MG PO TABS: 100.0000 mg | ORAL_TABLET | Freq: Two times a day (BID) | ORAL | 0 refills | Status: DC

## 2017-12-13 NOTE — Telephone Encounter (Signed)
He had 7 days of doxycycline. You can extend that to 10 days - Please order doxycycline 100 mg PO BID X3 days, #6, 0 refills

## 2017-12-13 NOTE — Telephone Encounter (Signed)
Called and spoke with pt who stated he stopped the abx last Friday, 10/11 and after pt stopped the abx, pt stated his symptoms slowly started coming back.  Pt stated last night, 10/15 was the worse for him with the symptoms.  Pt has had problems with chest congestion and throat, cough in the morning with clear mucus.  Denies any fever.  Pt is requesting another abx to be sent in. Tonya, please advise on this for pt.

## 2017-12-13 NOTE — Telephone Encounter (Signed)
Called and spoke with pt letting him know we were going to give him 3 more days of the doxy. Pt expressed understanding.  Rx sent to pt's preferred pharmacy. Nothing further needed.

## 2017-12-14 DIAGNOSIS — M3130 Wegener's granulomatosis without renal involvement: Secondary | ICD-10-CM | POA: Diagnosis not present

## 2017-12-14 DIAGNOSIS — Z9889 Other specified postprocedural states: Secondary | ICD-10-CM | POA: Diagnosis not present

## 2017-12-14 DIAGNOSIS — Z882 Allergy status to sulfonamides status: Secondary | ICD-10-CM | POA: Diagnosis not present

## 2017-12-14 DIAGNOSIS — Z880 Allergy status to penicillin: Secondary | ICD-10-CM | POA: Diagnosis not present

## 2017-12-14 DIAGNOSIS — J324 Chronic pansinusitis: Secondary | ICD-10-CM | POA: Diagnosis not present

## 2017-12-14 DIAGNOSIS — Z91040 Latex allergy status: Secondary | ICD-10-CM | POA: Diagnosis not present

## 2017-12-18 ENCOUNTER — Encounter: Payer: Self-pay | Admitting: Nurse Practitioner

## 2017-12-18 ENCOUNTER — Ambulatory Visit (INDEPENDENT_AMBULATORY_CARE_PROVIDER_SITE_OTHER): Payer: Medicare Other | Admitting: Nurse Practitioner

## 2017-12-18 VITALS — BP 136/70 | HR 67 | Ht 67.9 in | Wt 164.0 lb

## 2017-12-18 DIAGNOSIS — Z23 Encounter for immunization: Secondary | ICD-10-CM | POA: Diagnosis not present

## 2017-12-18 DIAGNOSIS — J449 Chronic obstructive pulmonary disease, unspecified: Secondary | ICD-10-CM

## 2017-12-18 NOTE — Progress Notes (Signed)
Reviewed, agree 

## 2017-12-18 NOTE — Patient Instructions (Addendum)
Great to see you are doing better now Continue current medications You are scheduled for a HRCT on 12/22/17 Please keep upcoming appointment with Dr. Lake Bells as scheduled or sooner if needed

## 2017-12-18 NOTE — Progress Notes (Signed)
@Patient  ID: Chad Morrison, male    DOB: Aug 24, 1932, 84 y.o.   MRN: [id number]  Chief Complaint  Patient presents with  . 2 Week Follow-up    Referring provider: Tisovec, Fransico Him, MD  HPI  84 year old former smoker followed by Dr. Lake Bells for COPD with asthma and allergic rhinitis.  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   Tests: ECHO 10/23/17>> Moderate AS, EF 60-65% Echo 09/08/16 >> mild LVH, EF 60 to 65% PFT 05/24/17 >> FEV1 2.44 (88%), FEV1% 65, TLC 7.41 (104%), DLCO 70% Doppler legs 07/03/17 >> no DVT; Baker's cyst Rt popliteal fossa  OV 12/18/17 - follow up shortness of breath Patient presents today for shortness of breath. He states that after taking the doxycycline prescribed by me (? PNA on last chest x ray) that he has been doing really well. He states that he no longer has the shortness of breath or low energy. Patient states that symptoms have improved. States that his energy has improved. Feels completely back to normal.      Allergies  Allergen Reactions  . Penicillins Hives    Has patient had a PCN reaction causing immediate rash, facial/tongue/throat swelling, SOB or lightheadedness with hypotension: No Has patient had a PCN reaction causing severe rash involving mucus membranes or skin necrosis: No Has patient had a PCN reaction that required hospitalization: No Has patient had a PCN reaction occurring within the last 10 years: Yes If all of the above answers are "NO", then may proceed with Cephalosporin use.   . Sulfa Antibiotics Other (See Comments)    Unknown to patient   . Latex Hives and Rash    Immunization History  Administered Date(s) Administered  . Influenza, High Dose Seasonal PF 12/27/2016, 12/18/2017  . Influenza-Unspecified 12/22/2016  . Pneumococcal Conjugate-13 01/04/2016  . Pneumococcal Polysaccharide-23 01/03/2014  . Pneumococcal-Unspecified 10/31/2016    Past Medical History:  Diagnosis Date  . Anxiety   .  Asthma   . Asthmatic bronchitis   . Atrial flutter (HCC)    chads2vasc score is 3  . Ehrlichiosis 0947  . GERD (gastroesophageal reflux disease)   . Granulomatosis with polyangiitis ([place])   . HTN (hypertension)   . Hyperlipidemia   . Paroxysmal atrial fibrillation (HCC)   . Prostate cancer ([place])   . Seasonal allergies     Tobacco History: Social History   Tobacco Use  Smoking Status Former Smoker  . Packs/day: 0.50  . Years: 4.00  . Pack years: 2.00  . Types: Cigarettes  . Last attempt to quit: 02/28/1961  . Years since quitting: 56.8  Smokeless Tobacco Never Used   Counseling given: Yes   Outpatient Encounter Medications as of 12/18/2017  Medication Sig  . albuterol (PROVENTIL HFA;VENTOLIN HFA) 108 (90 Base) MCG/ACT inhaler Inhale 1-2 puffs into the lungs every 6 (six) hours as needed for wheezing or shortness of breath.  . ALPRAZolam (XANAX) 0.25 MG tablet Take 0.125 mg by mouth at bedtime as needed for sleep.   Marland Kitchen apixaban (ELIQUIS) 5 MG TABS tablet Take 1 tablet (5 mg total) by mouth 2 (two) times daily.  . bimatoprost (LUMIGAN) 0.03 % ophthalmic solution Place 1 drop into both eyes at bedtime.   . budesonide-formoterol (SYMBICORT) 160-4.5 MCG/ACT inhaler Inhale 2 puffs into the lungs 2 (two) times daily.  . calcium carbonate (OSCAL) 1500 (600 Ca) MG TABS tablet Take 600 mg of elemental calcium by mouth 2 (two) times daily with a meal.  . diltiazem (  CARDIZEM CD) 120 MG 24 hr capsule Take 1 capsule (120 mg total) by mouth 2 (two) times daily.  . diphenhydrAMINE (BENADRYL) 25 MG tablet Take 50 mg by mouth at bedtime.   . dronedarone (MULTAQ) 400 MG tablet Take 1 tablet (400 mg total) by mouth 2 (two) times daily with a meal.  . folic acid (FOLVITE) 1 MG tablet Take 1 mg by mouth daily.  Marland Kitchen levocetirizine (XYZAL) 5 MG tablet Take 1 tablet (5 mg total) by mouth every evening.  Marland Kitchen levothyroxine (SYNTHROID, LEVOTHROID) 75 MCG tablet Take 75 mcg by mouth daily before breakfast.  .  methotrexate (RHEUMATREX) 2.5 MG tablet Take 25 mg by mouth every Wednesday. Caution:Chemotherapy. Protect from light.   . montelukast (SINGULAIR) 10 MG tablet Take 10 mg at bedtime by mouth.  . Multiple Vitamin (MULTIVITAMIN WITH MINERALS) TABS tablet Take 1 tablet daily by mouth.  . Multiple Vitamins-Minerals (OCUVITE PRESERVISION PO) Take 1 tablet 2 (two) times daily by mouth.  Mckinley Jewel Dimesylate (RHOPRESSA) 0.02 % SOLN Place 1 drop into the right eye daily.  Marland Kitchen omeprazole (PRILOSEC) 40 MG capsule Take 40 mg daily by mouth.  . predniSONE (DELTASONE) 5 MG tablet Take 7.5 mg by mouth daily with breakfast.   . simvastatin (ZOCOR) 10 MG tablet Take 10 mg by mouth every evening.   Marland Kitchen SPIRIVA RESPIMAT 2.5 MCG/ACT AERS Inhale 2 puffs into the lungs daily.  Marland Kitchen triamcinolone (NASACORT ALLERGY 24HR) 55 MCG/ACT AERO nasal inhaler Place 2 sprays 2 (two) times daily into the nose.  . [DISCONTINUED] doxycycline (VIBRA-TABS) 100 MG tablet Take 1 tablet (100 mg total) by mouth 2 (two) times daily. (Patient not taking: Reported on 12/18/2017)  . [DISCONTINUED] ranitidine (ZANTAC) 150 MG tablet Take 150 mg by mouth daily as needed for heartburn.    No facility-administered encounter medications on file as of 12/18/2017.      Review of Systems  Review of Systems  Constitutional: Negative.  Negative for chills and fever.  HENT: Negative.  Negative for congestion.   Respiratory: Negative for cough, shortness of breath and wheezing.   Cardiovascular: Negative.  Negative for chest pain, palpitations and leg swelling.  Gastrointestinal: Negative.   Allergic/Immunologic: Negative.   Neurological: Negative.   Psychiatric/Behavioral: Negative.        Physical Exam  BP 136/70 (BP Location: Left Arm, Patient Position: Sitting, Cuff Size: Normal)   Pulse 67   Ht 5' 7.9" (1.725 m)   Wt 164 lb (74.4 kg)   SpO2 97%   BMI 25.01 kg/m   Wt Readings from Last 5 Encounters:  12/18/17 164 lb (74.4 kg)    12/07/17 162 lb 6.4 oz (73.7 kg)  12/01/17 164 lb 4.8 oz (74.5 kg)  11/28/17 167 lb (75.8 kg)  11/23/17 165 lb (74.8 kg)     Physical Exam  Constitutional: He is oriented to person, place, and time. He appears well-developed and well-nourished. No distress.  Cardiovascular: Normal rate and regular rhythm.  Pulmonary/Chest: Effort normal and breath sounds normal. No respiratory distress. He has no wheezes.  Musculoskeletal: He exhibits no edema.  Neurological: He is alert and oriented to person, place, and time.  Skin: Skin is warm and dry.  Psychiatric: He has a normal mood and affect.  Nursing note and vitals reviewed.   Imaging: Dg Chest 2 View  Result Date: 12/04/2017 CLINICAL DATA:  Several day history of shortness of breath and dyspnea. Elevated D-dimer. History of atrial fibrillation. Currently experiencing bilateral lower extremity edema. History  of COPD, former smoker. EXAM: CHEST - 2 VIEW COMPARISON:  PA and lateral chest x-ray of Jul 21, 2017 FINDINGS: The lungs are well-expanded. The interstitial markings are coarse. Patchy areas of parenchymal consolidation of present peripherally in the left mid lung and at the right lung base. There is no pleural effusion or pneumothorax. The heart and pulmonary vascularity are normal. There is calcification in the wall of the aortic arch and descending thoracic aorta. There is mild partial compression of T5 which is new. The loss of height anteriorly is 40% and posteriorly is 15%. IMPRESSION: COPD. Subsegmental atelectasis or developing pneumonia at the right lung base and peripherally in the left mid lung. Followup PA and lateral chest X-ray is recommended in 3-4 weeks following trial of antibiotic therapy to ensure resolution and exclude underlying malignancy. No overt CHF. Thoracic aortic atherosclerosis. New partial compression of the body of T5. Electronically Signed   By: David  Martinique M.D.   On: 12/04/2017 12:59   Nm Pulmonary Per &  Vent  Result Date: 12/04/2017 CLINICAL DATA:  Shortness of breath for the past 3 months. EXAM: NUCLEAR MEDICINE VENTILATION - PERFUSION LUNG SCAN TECHNIQUE: Ventilation images were obtained in multiple projections using inhaled aerosol Tc-28m DTPA. Perfusion images were obtained in multiple projections after intravenous injection of Tc-61m-MAA. RADIOPHARMACEUTICALS:  30.6 mCi of Tc-22m DTPA aerosol inhalation and 4.2 mCi Tc55m-MAA IV COMPARISON:  Chest radiographs obtained today. FINDINGS: Ventilation: Patchy ventilation of both lungs with poor ventilation of the upper lobes, corresponding to the changes of COPD on the chest radiographs. Perfusion: No wedge shaped peripheral perfusion defects to suggest acute pulmonary embolism. IMPRESSION: COPD.  No evidence of pulmonary embolism. Electronically Signed   By: Claudie Revering M.D.   On: 12/04/2017 15:01     Assessment & Plan:   COPD (chronic obstructive pulmonary disease) ([place]) Patient Instructions  Great to see you are doing better now Continue current medications You are scheduled for a HRCT on 12/22/17 Please keep upcoming appointment with Dr. Lake Bells as scheduled or sooner if needed        Fenton Foy, NP 12/18/2017

## 2017-12-18 NOTE — Assessment & Plan Note (Signed)
Patient Instructions  Doristine Devoid to see you are doing better now Continue current medications You are scheduled for a HRCT on 12/22/17 Please keep upcoming appointment with Dr. Lake Bells as scheduled or sooner if needed

## 2017-12-22 ENCOUNTER — Ambulatory Visit (INDEPENDENT_AMBULATORY_CARE_PROVIDER_SITE_OTHER)
Admission: RE | Admit: 2017-12-22 | Discharge: 2017-12-22 | Disposition: A | Discharge status: Home / Self Care | Payer: Medicare Other | Source: Ambulatory Visit | Attending: Pulmonary Disease | Admitting: Pulmonary Disease

## 2017-12-22 DIAGNOSIS — R0602 Shortness of breath: Secondary | ICD-10-CM | POA: Diagnosis not present

## 2017-12-22 DIAGNOSIS — R918 Other nonspecific abnormal finding of lung field: Secondary | ICD-10-CM | POA: Diagnosis not present

## 2017-12-27 ENCOUNTER — Other Ambulatory Visit (INDEPENDENT_AMBULATORY_CARE_PROVIDER_SITE_OTHER): Payer: Medicare Other

## 2017-12-27 ENCOUNTER — Encounter: Payer: Self-pay | Admitting: Pulmonary Disease

## 2017-12-27 ENCOUNTER — Ambulatory Visit (INDEPENDENT_AMBULATORY_CARE_PROVIDER_SITE_OTHER): Payer: Medicare Other | Admitting: Pulmonary Disease

## 2017-12-27 VITALS — BP 136/74 | HR 96 | Ht 69.0 in | Wt 164.2 lb

## 2017-12-27 DIAGNOSIS — J449 Chronic obstructive pulmonary disease, unspecified: Secondary | ICD-10-CM | POA: Diagnosis not present

## 2017-12-27 DIAGNOSIS — J309 Allergic rhinitis, unspecified: Secondary | ICD-10-CM | POA: Diagnosis not present

## 2017-12-27 DIAGNOSIS — J849 Interstitial pulmonary disease, unspecified: Secondary | ICD-10-CM

## 2017-12-27 DIAGNOSIS — I4891 Unspecified atrial fibrillation: Secondary | ICD-10-CM

## 2017-12-27 DIAGNOSIS — J4540 Moderate persistent asthma, uncomplicated: Secondary | ICD-10-CM | POA: Diagnosis not present

## 2017-12-27 DIAGNOSIS — I6521 Occlusion and stenosis of right carotid artery: Secondary | ICD-10-CM | POA: Diagnosis not present

## 2017-12-27 DIAGNOSIS — I499 Cardiac arrhythmia, unspecified: Secondary | ICD-10-CM

## 2017-12-27 LAB — CBC WITH DIFFERENTIAL/PLATELET
Basophils Absolute: 0.1 10*3/uL (ref 0.0–0.1)
Basophils Relative: 1.2 % (ref 0.0–3.0)
Eosinophils Absolute: 0.5 10*3/uL (ref 0.0–0.7)
Eosinophils Relative: 5.3 % — ABNORMAL HIGH (ref 0.0–5.0)
HCT: 34.3 % — ABNORMAL LOW (ref 39.0–52.0)
Hemoglobin: 11 g/dL — ABNORMAL LOW (ref 13.0–17.0)
Lymphocytes Relative: 5.5 % — ABNORMAL LOW (ref 12.0–46.0)
Lymphs Abs: 0.5 10*3/uL — ABNORMAL LOW (ref 0.7–4.0)
MCHC: 32.1 g/dL (ref 30.0–36.0)
MCV: 77.7 fl — ABNORMAL LOW (ref 78.0–100.0)
Monocytes Absolute: 1.5 10*3/uL — ABNORMAL HIGH (ref 0.1–1.0)
Monocytes Relative: 16.6 % — ABNORMAL HIGH (ref 3.0–12.0)
Neutro Abs: 6.4 10*3/uL (ref 1.4–7.7)
Neutrophils Relative %: 71.4 % (ref 43.0–77.0)
Platelets: 226 10*3/uL (ref 150.0–400.0)
RBC: 4.42 Mil/uL (ref 4.22–5.81)
RDW: 22.3 % — ABNORMAL HIGH (ref 11.5–15.5)
WBC: 9 10*3/uL (ref 4.0–10.5)

## 2017-12-27 NOTE — Patient Instructions (Signed)
Chronic sinusitis which has been worse recently: I will check a serum IgE and serum eosinophil count to look for evidence of allergy If these tests are negative then please remind me to call your ENT doctor at [facility]: Dr. Vinetta Bergamo so that I can discuss your situation  Small vessel vasculitis: Continue taking methotrexate and prednisone as directed by the rheumatology clinic at [facility]  Severe persistent asthma: Stop Spiriva Continue Symbicort Continue albuterol as you are doing  Abnormal CT scan of the chest with pulmonary nodules and groundglass: We will repeat a lung function test in March We will repeat a CT scan of your chest in March  Follow-up with me in January or sooner if needed

## 2017-12-27 NOTE — Progress Notes (Signed)
Subjective:    Patient ID: Chad Morrison, male    DOB: 1933-01-28, 84 y.o.   MRN: [id number]  Synopsis: Referred in 2018 for evaluation of asthma.  He was diagnosed with ANCA associated vasculitis in 2019.  He started on methotrexate and prednisone for small vessel vasculitis in 2019.  He also was diagnosed with A. fib and was treated with amiodarone for about 1 year by the cardiology clinic.   HPI Chief Complaint  Patient presents with  . Follow-up    review CT chest.  pt c/o sinus congestion, pnd.     Chad Morrison says that his breathing improved significantly after he took doxycycline.  Specifically when he was sick he said that he was fatigued, had increasing shortness of breath and some congestion.  He said that he had to use more antihistamine in the evenings because he was feeling more stopped up and coughing.  The doxycycline really helped with all of this and now he feels better.  He says that he continues to take the Spiriva and the Symbicort and he feels strongly that the Spiriva is not doing much but the Symbicort is quite helpful.  He uses albuterol typically about 1 time per day prior to bedtime.  He has not had much trouble with shortness of breath in the last few weeks since completing the doxycycline.  However, he says that he does feel more sinus congestion.  He is not having the "crusting" which she experiences with his small vessel vasculitis but he is having increasing congestion and runny nose.  He continues to take prednisone 5 mg daily as well as methotrexate.  He has not been in A. fib.  Past Medical History:  Diagnosis Date  . Anxiety   . Asthma   . Asthmatic bronchitis   . Atrial flutter (HCC)    chads2vasc score is 3  . Ehrlichiosis 6948  . GERD (gastroesophageal reflux disease)   . Granulomatosis with polyangiitis ([place])   . HTN (hypertension)   . Hyperlipidemia   . Paroxysmal atrial fibrillation (HCC)   . Prostate cancer ([place])   . Seasonal allergies           Review of Systems  Constitutional: Negative for fever and unexpected weight change.  HENT: Negative for congestion, dental problem, ear pain, nosebleeds, postnasal drip, rhinorrhea, sinus pressure, sneezing, sore throat and trouble swallowing.   Eyes: Negative for redness and itching.  Respiratory: Negative for cough, chest tightness, shortness of breath and wheezing.   Cardiovascular: Negative for palpitations and leg swelling.  Gastrointestinal: Negative for nausea and vomiting.  Genitourinary: Negative for dysuria.  Musculoskeletal: Negative for joint swelling.  Skin: Negative for rash.  Neurological: Negative for headaches.  Hematological: Does not bruise/bleed easily.  Psychiatric/Behavioral: Negative for dysphoric mood. The patient is not nervous/anxious.        Objective:   Physical Exam  Vitals:   12/27/17 1120  BP: 136/74  Pulse: 96  SpO2: 96%  Weight: 164 lb 3.2 oz (74.5 kg)  Height: 5\' 9"  (1.753 m)    Gen: chronically ill appearing HENT: OP clear, TM's clear, neck supple PULM: CTA B, normal percussion CV: RRR, no mgr, trace edema GI: BS+, soft, nontender Derm: no cyanosis or rash Psyche: normal mood and affect   Pulmonary function testing: March 2019 ratio 65%, FEV1 2.44 L 88% predicted, FVC 3.78 L 96% predicted, total lung capacity 7.41 L 104% predicted, residual volume 152% predict, DLCO 22.8 70% predicted  Chest imaging: October 2019  CT chest images independently reviewed showing normal pulmonary parenchyma in the upper lobes but there are nodules scattered throughout, largest 6 mm in size, there is also basilar predominant reticulation and nonspecific groundglass which appears to be central in distribution.  Overall findings are mild.  CBC    Component Value Date/Time   WBC 8.4 11/09/2017 1415   RBC 4.47 11/09/2017 1415   HGB 11.0 (L) 11/09/2017 1415   HCT 36.5 (L) 11/09/2017 1415   PLT 272 11/09/2017 1415   MCV 81.7 11/09/2017 1415   MCH  24.6 (L) 11/09/2017 1415   MCHC 30.1 11/09/2017 1415   RDW 18.6 (H) 11/09/2017 1415    06/2017 CXR > flattening of diaphragms, no infiltrate  Records from his 07/2017 visit with [facility] rheum reviewed where his ANCA vasculitis was treated with MTX and prednisone was tapered     Assessment & Plan:   Allergic rhinitis, unspecified seasonality, unspecified trigger - Plan: CBC with Differential/Platelet, IgE  Interstitial pulmonary disease (Chad Morrison) - Plan: CT Chest Wo Contrast  Chronic obstructive pulmonary disease, unspecified COPD type (HCC)  Irregular heart rhythm  Atrial fibrillation, unspecified type ([place])  Moderate persistent chronic asthma without complication  Discussion: I believe [name] had pneumonia recently as he had significant improvement with doxycycline.  The findings on the CT scan of his chest showed groundglass and reticulation in the bases of his lungs which are quite nonspecific but considering his recent clinical improvement I think this represents resolving pneumonia.  While I cannot rule out concomitant small vessel vasculitis causing this inflammation he does not have any symptoms which would make me believe that that is the case right now.  Am not quite sure what to make of his increased sinus congestion he has been experiencing recently.  It could be allergies I suppose but I would think that prednisone and methotrexate with control these.  I do worry about the possibility of a small vessel vasculitis causing the symptoms.  Plan: Chronic sinusitis which has been worse recently: I will check a serum IgE and serum eosinophil count to look for evidence of allergy If these tests are negative then please remind me to call your ENT doctor at [facility]: Dr. Vinetta Morrison so that I can discuss your situation  Small vessel vasculitis: Continue taking methotrexate and prednisone as directed by the rheumatology clinic at [facility]  Severe persistent asthma: Stop Spiriva Continue  Symbicort Continue albuterol as you are doing  Abnormal CT scan of the chest with pulmonary nodules and groundglass: We will repeat a lung function test in March We will repeat a CT scan of your chest in March  Follow-up with me in January or sooner if needed  Greater than 50% of this 40-minute visit spent face-to-face    Current Outpatient Medications:  .  albuterol (PROVENTIL HFA;VENTOLIN HFA) 108 (90 Base) MCG/ACT inhaler, Inhale 1-2 puffs into the lungs every 6 (six) hours as needed for wheezing or shortness of breath., Disp: 3 Inhaler, Rfl: 3 .  ALPRAZolam (XANAX) 0.25 MG tablet, Take 0.125 mg by mouth at bedtime as needed for sleep. , Disp: , Rfl:  .  apixaban (ELIQUIS) 5 MG TABS tablet, Take 1 tablet (5 mg total) by mouth 2 (two) times daily., Disp: 180 tablet, Rfl: 3 .  bimatoprost (LUMIGAN) 0.03 % ophthalmic solution, Place 1 drop into both eyes at bedtime. , Disp: , Rfl:  .  budesonide-formoterol (SYMBICORT) 160-4.5 MCG/ACT inhaler, Inhale 2 puffs into the lungs 2 (two) times daily., Disp:  1 Inhaler, Rfl: 5 .  calcium carbonate (OSCAL) 1500 (600 Ca) MG TABS tablet, Take 600 mg of elemental calcium by mouth 2 (two) times daily with a meal., Disp: , Rfl:  .  diltiazem (CARDIZEM CD) 120 MG 24 hr capsule, Take 1 capsule (120 mg total) by mouth 2 (two) times daily., Disp: 60 capsule, Rfl: 6 .  diphenhydrAMINE (BENADRYL) 25 MG tablet, Take 50 mg by mouth at bedtime. , Disp: , Rfl:  .  dronedarone (MULTAQ) 400 MG tablet, Take 1 tablet (400 mg total) by mouth 2 (two) times daily with a meal., Disp: 60 tablet, Rfl: 11 .  folic acid (FOLVITE) 1 MG tablet, Take 1 mg by mouth daily., Disp: , Rfl:  .  levocetirizine (XYZAL) 5 MG tablet, Take 1 tablet (5 mg total) by mouth every evening., Disp: 93 tablet, Rfl: 3 .  levothyroxine (SYNTHROID, LEVOTHROID) 75 MCG tablet, Take 75 mcg by mouth daily before breakfast., Disp: , Rfl:  .  methotrexate (RHEUMATREX) 2.5 MG tablet, Take 25 mg by mouth every  Wednesday. Caution:Chemotherapy. Protect from light. , Disp: , Rfl:  .  montelukast (SINGULAIR) 10 MG tablet, Take 10 mg at bedtime by mouth., Disp: , Rfl:  .  Multiple Vitamin (MULTIVITAMIN WITH MINERALS) TABS tablet, Take 1 tablet daily by mouth., Disp: , Rfl:  .  Multiple Vitamins-Minerals (OCUVITE PRESERVISION PO), Take 1 tablet 2 (two) times daily by mouth., Disp: , Rfl:  .  Netarsudil Dimesylate (RHOPRESSA) 0.02 % SOLN, Place 1 drop into the right eye daily., Disp: , Rfl:  .  omeprazole (PRILOSEC) 40 MG capsule, Take 40 mg daily by mouth., Disp: , Rfl:  .  predniSONE (DELTASONE) 5 MG tablet, Take 5 mg by mouth daily with breakfast. , Disp: , Rfl:  .  simvastatin (ZOCOR) 10 MG tablet, Take 10 mg by mouth every evening. , Disp: , Rfl:  .  SPIRIVA RESPIMAT 2.5 MCG/ACT AERS, Inhale 2 puffs into the lungs daily., Disp: , Rfl: 5 .  triamcinolone (NASACORT ALLERGY 24HR) 55 MCG/ACT AERO nasal inhaler, Place 2 sprays 2 (two) times daily into the nose., Disp: , Rfl:

## 2017-12-28 LAB — IGE: IgE (Immunoglobulin E), Serum: 85 kU/L (ref <=114)

## 2017-12-29 ENCOUNTER — Telehealth: Payer: Self-pay

## 2017-12-29 NOTE — Telephone Encounter (Signed)
-----   Message from Juanito Doom, MD sent at 12/29/2017  9:24 AM EDT ----- Hi, Please let him know that there was some evidence of allergy seen on his blood work, but not the kind that would suggest he needs Xolair injections (what his prior pulmonary doctor suggested).    I'm still waiting to talk to his ENT physician over at [facility].  Can we let his office know that I'd like to speak to Dr. Vinetta Bergamo?  Ruby Cola

## 2017-12-29 NOTE — Telephone Encounter (Signed)
Called and spoke with patient, he is aware of the result and verbalized understanding. I have called Dr. Vinetta Bergamo office with [phone] ENT and was unable to reach anyone due to the office being closed. I will leave encounter open to try again.

## 2018-01-02 DIAGNOSIS — L905 Scar conditions and fibrosis of skin: Secondary | ICD-10-CM | POA: Diagnosis not present

## 2018-01-02 DIAGNOSIS — C44719 Basal cell carcinoma of skin of left lower limb, including hip: Secondary | ICD-10-CM | POA: Diagnosis not present

## 2018-01-02 DIAGNOSIS — L570 Actinic keratosis: Secondary | ICD-10-CM | POA: Diagnosis not present

## 2018-01-03 NOTE — Telephone Encounter (Signed)
I have called and left another message for the nurse of Dr. Vinetta Bergamo. Will route to [facility] to follow up on.

## 2018-01-04 DIAGNOSIS — H353211 Exudative age-related macular degeneration, right eye, with active choroidal neovascularization: Secondary | ICD-10-CM | POA: Diagnosis not present

## 2018-01-04 DIAGNOSIS — H25813 Combined forms of age-related cataract, bilateral: Secondary | ICD-10-CM | POA: Diagnosis not present

## 2018-01-04 DIAGNOSIS — H35371 Puckering of macula, right eye: Secondary | ICD-10-CM | POA: Diagnosis not present

## 2018-01-05 ENCOUNTER — Encounter (HOSPITAL_COMMUNITY): Payer: Self-pay | Admitting: Nurse Practitioner

## 2018-01-05 ENCOUNTER — Ambulatory Visit (HOSPITAL_COMMUNITY)
Admission: RE | Admit: 2018-01-05 | Discharge: 2018-01-05 | Disposition: A | Discharge status: Home / Self Care | Payer: Medicare Other | Source: Ambulatory Visit | Attending: Nurse Practitioner | Admitting: Nurse Practitioner

## 2018-01-05 VITALS — BP 136/74 | HR 106 | Ht 69.0 in | Wt 163.0 lb

## 2018-01-05 DIAGNOSIS — J45909 Unspecified asthma, uncomplicated: Secondary | ICD-10-CM | POA: Insufficient documentation

## 2018-01-05 DIAGNOSIS — Z880 Allergy status to penicillin: Secondary | ICD-10-CM | POA: Insufficient documentation

## 2018-01-05 DIAGNOSIS — Z85828 Personal history of other malignant neoplasm of skin: Secondary | ICD-10-CM | POA: Insufficient documentation

## 2018-01-05 DIAGNOSIS — R0600 Dyspnea, unspecified: Secondary | ICD-10-CM | POA: Diagnosis not present

## 2018-01-05 DIAGNOSIS — Z9889 Other specified postprocedural states: Secondary | ICD-10-CM | POA: Insufficient documentation

## 2018-01-05 DIAGNOSIS — K219 Gastro-esophageal reflux disease without esophagitis: Secondary | ICD-10-CM | POA: Diagnosis not present

## 2018-01-05 DIAGNOSIS — I4892 Unspecified atrial flutter: Secondary | ICD-10-CM | POA: Diagnosis not present

## 2018-01-05 DIAGNOSIS — Z7951 Long term (current) use of inhaled steroids: Secondary | ICD-10-CM | POA: Insufficient documentation

## 2018-01-05 DIAGNOSIS — Z79890 Hormone replacement therapy (postmenopausal): Secondary | ICD-10-CM | POA: Diagnosis not present

## 2018-01-05 DIAGNOSIS — Z8546 Personal history of malignant neoplasm of prostate: Secondary | ICD-10-CM | POA: Diagnosis not present

## 2018-01-05 DIAGNOSIS — Z882 Allergy status to sulfonamides status: Secondary | ICD-10-CM | POA: Diagnosis not present

## 2018-01-05 DIAGNOSIS — Z7901 Long term (current) use of anticoagulants: Secondary | ICD-10-CM | POA: Insufficient documentation

## 2018-01-05 DIAGNOSIS — I10 Essential (primary) hypertension: Secondary | ICD-10-CM | POA: Diagnosis not present

## 2018-01-05 DIAGNOSIS — F419 Anxiety disorder, unspecified: Secondary | ICD-10-CM | POA: Diagnosis not present

## 2018-01-05 DIAGNOSIS — Z79899 Other long term (current) drug therapy: Secondary | ICD-10-CM | POA: Insufficient documentation

## 2018-01-05 DIAGNOSIS — E785 Hyperlipidemia, unspecified: Secondary | ICD-10-CM | POA: Diagnosis not present

## 2018-01-05 DIAGNOSIS — Z87891 Personal history of nicotine dependence: Secondary | ICD-10-CM | POA: Insufficient documentation

## 2018-01-05 DIAGNOSIS — Z833 Family history of diabetes mellitus: Secondary | ICD-10-CM | POA: Insufficient documentation

## 2018-01-05 NOTE — Progress Notes (Signed)
Primary Care Physician: Tisovec, Fransico Him, MD Referring Physician: Dr. Rosalyn Charters Chad Morrison is a 84 y.o. male with a h/o typical atrial flutter, on amiodarone for evaluation in the afib clinic. He saw Dr. [name] in August and was c/o of shortness of breath. There was concern of amiodarone toxicity and pt asked to reduce amio to 100 mg a day. He had refused typical atrial flutter ablation in the past. He saw pulmonology yesterday and found to be in rapid atrial fibrillation. Dr. [name]  was notified  and he was asked to have f/u here. He increased his amio back to 200 mg yesterday and today. Pulmonology is planning to repeat PFT's when his heart rate is controlled. 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 Pulmonology  questioned if lungs were also involved contributing to increased shortness of breath.Pt is unaware of his RVR. He seems to be tolerating well.  F/u in afib clinic 10/1.Pt was scheduled to have a cardioversion but he went back into SR with increased dose of amiodarone to 200 mg bid.He was then instructed to lower to 200 mg daily. He was seen in ENTand has to have occasional  thickened dried nasal secretions removed,2/2 Wegner's disease. This was very painful and he noted he was in afib again this am.  F/u in afib clinic, 11/8. He saw Dr. [name], 10/10. Amiodarone was stopped and multaq was started. He reports that he has felt very well and has not  noted any afib. However, his EKG today shows afib. He  was surprised. He  did notice that he felt tired today. He worked in the yard most of yesterday and felt great.  Today, he denies symptoms of  chest pain, shortness of breath, orthopnea, PND, lower extremity edema, dizziness, presyncope, syncope, or neurologic sequela. The patient is tolerating medications without difficulties and is otherwise without complaint today.   Past Medical History:  Diagnosis Date  . Anxiety   . Asthma   . Asthmatic bronchitis     . Atrial flutter (HCC)    chads2vasc score is 3  . Ehrlichiosis 1610  . GERD (gastroesophageal reflux disease)   . Granulomatosis with polyangiitis ([place])   . HTN (hypertension)   . Hyperlipidemia   . Paroxysmal atrial fibrillation (HCC)   . Prostate cancer ([place])   . Seasonal allergies    Past Surgical History:  Procedure Laterality Date  . BASAL CELL CARCINOMA EXCISION  2013   EYELID  . HERNIA REPAIR Bilateral 2000, 2002  . KNEE ARTHROSCOPY  2000  . prostectomy  1995   RADICAL  . TONSILLECTOMY  1940    Current Outpatient Medications  Medication Sig Dispense Refill  . albuterol (PROVENTIL HFA;VENTOLIN HFA) 108 (90 Base) MCG/ACT inhaler Inhale 1-2 puffs into the lungs every 6 (six) hours as needed for wheezing or shortness of breath. 3 Inhaler 3  . ALPRAZolam (XANAX) 0.25 MG tablet Take 0.125 mg by mouth at bedtime as needed for sleep.     Marland Kitchen apixaban (ELIQUIS) 5 MG TABS tablet Take 1 tablet (5 mg total) by mouth 2 (two) times daily. 180 tablet 3  . bimatoprost (LUMIGAN) 0.03 % ophthalmic solution Place 1 drop into both eyes at bedtime.     . budesonide-formoterol (SYMBICORT) 160-4.5 MCG/ACT inhaler Inhale 2 puffs into the lungs 2 (two) times daily. 1 Inhaler 5  . calcium carbonate (OSCAL) 1500 (600 Ca) MG TABS tablet Take 600 mg of elemental calcium by mouth 2 (two)  times daily with a meal.    . diltiazem (CARDIZEM CD) 120 MG 24 hr capsule Take 1 capsule (120 mg total) by mouth 2 (two) times daily. 60 capsule 6  . diphenhydrAMINE (BENADRYL) 25 MG tablet Take 50 mg by mouth at bedtime.     . dronedarone (MULTAQ) 400 MG tablet Take 1 tablet (400 mg total) by mouth 2 (two) times daily with a meal. 60 tablet 11  . folic acid (FOLVITE) 1 MG tablet Take 1 mg by mouth daily.    Marland Kitchen levocetirizine (XYZAL) 5 MG tablet Take 1 tablet (5 mg total) by mouth every evening. 93 tablet 3  . levothyroxine (SYNTHROID, LEVOTHROID) 75 MCG tablet Take 75 mcg by mouth daily before breakfast.    .  methotrexate (RHEUMATREX) 2.5 MG tablet Take 25 mg by mouth every Wednesday. Caution:Chemotherapy. Protect from light.     . montelukast (SINGULAIR) 10 MG tablet Take 10 mg at bedtime by mouth.    . Multiple Vitamin (MULTIVITAMIN WITH MINERALS) TABS tablet Take 1 tablet daily by mouth.    . Multiple Vitamins-Minerals (OCUVITE PRESERVISION PO) Take 1 tablet 2 (two) times daily by mouth.    Chad Morrison (RHOPRESSA) 0.02 % SOLN Place 1 drop into the right eye daily.    Marland Kitchen omeprazole (PRILOSEC) 40 MG capsule Take 40 mg daily by mouth.    . predniSONE (DELTASONE) 5 MG tablet Take 5 mg by mouth daily with breakfast.     . simvastatin (ZOCOR) 10 MG tablet Take 10 mg by mouth every evening.     Marland Kitchen SPIRIVA RESPIMAT 2.5 MCG/ACT AERS Inhale 2 puffs into the lungs daily.  5  . triamcinolone (NASACORT ALLERGY 24HR) 55 MCG/ACT AERO nasal inhaler Place 2 sprays 2 (two) times daily into the nose.     No current facility-administered medications for this encounter.     Allergies  Allergen Reactions  . Penicillins Hives    Has patient had a PCN reaction causing immediate rash, facial/tongue/throat swelling, SOB or lightheadedness with hypotension: No Has patient had a PCN reaction causing severe rash involving mucus membranes or skin necrosis: No Has patient had a PCN reaction that required hospitalization: No Has patient had a PCN reaction occurring within the last 10 years: Yes If all of the above answers are "NO", then may proceed with Cephalosporin use.   . Sulfa Antibiotics Other (See Comments)    Unknown to patient   . Latex Hives and Rash    Social History   Socioeconomic History  . Marital status: Married    Spouse name: Chad Morrison  . Number of children: 2  . Years of education: Not on file  . Highest education level: Not on file  Occupational History  . Occupation: RETIRED  Social Needs  . Financial resource strain: Not on file  . Food insecurity:    Worry: Not on file     Inability: Not on file  . Transportation needs:    Medical: Not on file    Non-medical: Not on file  Tobacco Use  . Smoking status: Former Smoker    Packs/day: 0.50    Years: 4.00    Pack years: 2.00    Types: Cigarettes    Last attempt to quit: 02/28/1961    Years since quitting: 56.8  . Smokeless tobacco: Never Used  Substance and Sexual Activity  . Alcohol use: Yes  . Drug use: No  . Sexual activity: Not on file  Lifestyle  . Physical activity:  Days per week: Not on file    Minutes per session: Not on file  . Stress: Not on file  Relationships  . Social connections:    Talks on phone: Not on file    Gets together: Not on file    Attends religious service: Not on file    Active member of club or organization: Not on file    Attends meetings of clubs or organizations: Not on file    Relationship status: Not on file  . Intimate partner violence:    Fear of current or ex partner: Not on file    Emotionally abused: Not on file    Physically abused: Not on file    Forced sexual activity: Not on file  Other Topics Concern  . Not on file  Social History Narrative   Pt recently moved to [place] from [place] to be near his son.   Retired.    Family History  Problem Relation Age of Onset  . Asthma Mother 25  . Allergies Mother   . Ulcers Mother   . CVA Father 10  . Heart Problems Father   . Other Father        NEUROLOGIC ISSUES   . Other Brother 11       BICYCLE ACCIDENT  . Migraines Son   . Diabetes Mellitus I Son     ROS- All systems are reviewed and negative except as per the HPI above  Physical Exam: Vitals:   01/05/18 1044  BP: 136/74  Pulse: (!) 106  Weight: 73.9 kg  Height: 5\' 9"  (1.753 m)   Wt Readings from Last 3 Encounters:  01/05/18 73.9 kg  12/27/17 74.5 kg  12/18/17 74.4 kg    Labs: Lab Results  Component Value Date   NA 139 12/01/2017   K 4.3 12/01/2017   CL 103 12/01/2017   CO2 27 12/01/2017   GLUCOSE 123 (H) 12/01/2017    BUN 27 (H) 12/01/2017   CREATININE 1.63 (H) 12/01/2017   CALCIUM 10.2 12/01/2017   No results found for: INR No results found for: CHOL, HDL, LDLCALC, TRIG   GEN- The patient is well appearing, alert and oriented x 3 today.   Head- normocephalic, atraumatic Eyes-  Sclera clear, conjunctiva pink Ears- hearing intact Oropharynx- clear Neck- supple, no JVP Lymph- no cervical lymphadenopathy Lungs- Clear to ausculation bilaterally, normal work of breathing Heart- Rapid irregular rate and rhythm, no murmurs, rubs or gallops, PMI not laterally displaced GI- soft, NT, ND, + BS Extremities- no clubbing, cyanosis, or edema MS- no significant deformity or atrophy Skin- no rash or lesion Psych- euthymic mood, full affect Neuro- strength and sensation are intact  EKG-atrial  flutter at 106 bpm, qrs int 150 ms, qtc 467 ms, RBBB    Assessment and Plan: 1. Afib/flutter with RVR Amiodarone stopped and pt  has been on Multaq, felt to be in SR However, he is in Afib today He is not convinced it is persistent He will track closely  and return in 10 days to discuss Continue cardizem 120 mg bid for rate control  2. Dyspnea/ small vessel vasculitis Per pulmonology   F/u here in 10 days  Butch Penny C. Jermel Artley, [facility] [address] [place], [place] [address] [phone]

## 2018-01-08 DIAGNOSIS — H3561 Retinal hemorrhage, right eye: Secondary | ICD-10-CM | POA: Diagnosis not present

## 2018-01-08 DIAGNOSIS — H2512 Age-related nuclear cataract, left eye: Secondary | ICD-10-CM | POA: Diagnosis not present

## 2018-01-08 DIAGNOSIS — H2511 Age-related nuclear cataract, right eye: Secondary | ICD-10-CM | POA: Diagnosis not present

## 2018-01-08 DIAGNOSIS — H353211 Exudative age-related macular degeneration, right eye, with active choroidal neovascularization: Secondary | ICD-10-CM | POA: Diagnosis not present

## 2018-01-09 DIAGNOSIS — R946 Abnormal results of thyroid function studies: Secondary | ICD-10-CM | POA: Diagnosis not present

## 2018-01-09 DIAGNOSIS — D8989 Other specified disorders involving the immune mechanism, not elsewhere classified: Secondary | ICD-10-CM | POA: Diagnosis not present

## 2018-01-09 DIAGNOSIS — Z6824 Body mass index (BMI) 24.0-24.9, adult: Secondary | ICD-10-CM | POA: Diagnosis not present

## 2018-01-09 DIAGNOSIS — J4540 Moderate persistent asthma, uncomplicated: Secondary | ICD-10-CM | POA: Diagnosis not present

## 2018-01-09 DIAGNOSIS — J302 Other seasonal allergic rhinitis: Secondary | ICD-10-CM | POA: Diagnosis not present

## 2018-01-09 DIAGNOSIS — Z7901 Long term (current) use of anticoagulants: Secondary | ICD-10-CM | POA: Diagnosis not present

## 2018-01-09 DIAGNOSIS — J849 Interstitial pulmonary disease, unspecified: Secondary | ICD-10-CM | POA: Diagnosis not present

## 2018-01-09 DIAGNOSIS — E7800 Pure hypercholesterolemia, unspecified: Secondary | ICD-10-CM | POA: Diagnosis not present

## 2018-01-09 DIAGNOSIS — I4892 Unspecified atrial flutter: Secondary | ICD-10-CM | POA: Diagnosis not present

## 2018-01-09 DIAGNOSIS — M3130 Wegener's granulomatosis without renal involvement: Secondary | ICD-10-CM | POA: Diagnosis not present

## 2018-01-10 ENCOUNTER — Ambulatory Visit (INDEPENDENT_AMBULATORY_CARE_PROVIDER_SITE_OTHER): Payer: Medicare Other | Admitting: Podiatry

## 2018-01-10 ENCOUNTER — Ambulatory Visit (INDEPENDENT_AMBULATORY_CARE_PROVIDER_SITE_OTHER): Payer: Medicare Other

## 2018-01-10 ENCOUNTER — Other Ambulatory Visit: Payer: Self-pay | Admitting: Podiatry

## 2018-01-10 DIAGNOSIS — M25571 Pain in right ankle and joints of right foot: Secondary | ICD-10-CM

## 2018-01-10 DIAGNOSIS — R600 Localized edema: Secondary | ICD-10-CM

## 2018-01-10 DIAGNOSIS — I6521 Occlusion and stenosis of right carotid artery: Secondary | ICD-10-CM | POA: Diagnosis not present

## 2018-01-10 DIAGNOSIS — M779 Enthesopathy, unspecified: Secondary | ICD-10-CM

## 2018-01-10 DIAGNOSIS — R2681 Unsteadiness on feet: Secondary | ICD-10-CM | POA: Diagnosis not present

## 2018-01-10 NOTE — Progress Notes (Signed)
Subjective:   Patient ID: Chad Morrison, male   DOB: 84 y.o.   MRN: [id number]   HPI Patient is been concerned about some swelling in his ankle and foot after taking prednisone for a long period of time due to Wegener's disease.  Also has nails that are thickened and he cannot cut and they become tender   Review of Systems  All other systems reviewed and are negative.       Objective:  Physical Exam  Constitutional: He appears well-developed and well-nourished.  Cardiovascular: Intact distal pulses.  Pulmonary/Chest: Effort normal.  Musculoskeletal: Normal range of motion.  Neurological: He is alert.  Skin: Skin is warm.  Nursing note and vitals reviewed.   Neurovascular status was found to be intact muscle strength was adequate patient found to have incurvated nailbeds with history of COPD and moderate clubbing of the nailbeds.  Patient has mild swelling but negative Homans sign was noted and it does appear to be coming down but it still is mildly swollen in the right foot with minimal discomfort with movement     Assessment:  Probability that this is related to the COPD with nail disease thickness and pain 1-5 both feet along with mild right foot edema and ankle edema     Plan:  H&P conditions reviewed at this point applied compression stocking to the foot breeding nailbeds 1-5 both feet with no iatrogenic bleeding and reappoint for routine care

## 2018-01-15 ENCOUNTER — Encounter (HOSPITAL_COMMUNITY): Payer: Self-pay | Admitting: Nurse Practitioner

## 2018-01-15 ENCOUNTER — Ambulatory Visit (HOSPITAL_COMMUNITY)
Admission: RE | Admit: 2018-01-15 | Discharge: 2018-01-15 | Disposition: A | Discharge status: Home / Self Care | Payer: Medicare Other | Source: Ambulatory Visit | Attending: Nurse Practitioner | Admitting: Nurse Practitioner

## 2018-01-15 VITALS — BP 164/62 | HR 67 | Ht 69.0 in | Wt 166.0 lb

## 2018-01-15 DIAGNOSIS — Z825 Family history of asthma and other chronic lower respiratory diseases: Secondary | ICD-10-CM | POA: Insufficient documentation

## 2018-01-15 DIAGNOSIS — Z7901 Long term (current) use of anticoagulants: Secondary | ICD-10-CM | POA: Insufficient documentation

## 2018-01-15 DIAGNOSIS — I776 Arteritis, unspecified: Secondary | ICD-10-CM | POA: Diagnosis not present

## 2018-01-15 DIAGNOSIS — Z9889 Other specified postprocedural states: Secondary | ICD-10-CM | POA: Diagnosis not present

## 2018-01-15 DIAGNOSIS — Z8249 Family history of ischemic heart disease and other diseases of the circulatory system: Secondary | ICD-10-CM | POA: Diagnosis not present

## 2018-01-15 DIAGNOSIS — Z880 Allergy status to penicillin: Secondary | ICD-10-CM | POA: Insufficient documentation

## 2018-01-15 DIAGNOSIS — Z833 Family history of diabetes mellitus: Secondary | ICD-10-CM | POA: Insufficient documentation

## 2018-01-15 DIAGNOSIS — Z85828 Personal history of other malignant neoplasm of skin: Secondary | ICD-10-CM | POA: Diagnosis not present

## 2018-01-15 DIAGNOSIS — Z7952 Long term (current) use of systemic steroids: Secondary | ICD-10-CM | POA: Diagnosis not present

## 2018-01-15 DIAGNOSIS — J45909 Unspecified asthma, uncomplicated: Secondary | ICD-10-CM | POA: Diagnosis not present

## 2018-01-15 DIAGNOSIS — R9431 Abnormal electrocardiogram [ECG] [EKG]: Secondary | ICD-10-CM | POA: Diagnosis not present

## 2018-01-15 DIAGNOSIS — I4510 Unspecified right bundle-branch block: Secondary | ICD-10-CM | POA: Insufficient documentation

## 2018-01-15 DIAGNOSIS — Z882 Allergy status to sulfonamides status: Secondary | ICD-10-CM | POA: Insufficient documentation

## 2018-01-15 DIAGNOSIS — Z87891 Personal history of nicotine dependence: Secondary | ICD-10-CM | POA: Insufficient documentation

## 2018-01-15 DIAGNOSIS — Z8546 Personal history of malignant neoplasm of prostate: Secondary | ICD-10-CM | POA: Diagnosis not present

## 2018-01-15 DIAGNOSIS — Z79899 Other long term (current) drug therapy: Secondary | ICD-10-CM | POA: Insufficient documentation

## 2018-01-15 DIAGNOSIS — E785 Hyperlipidemia, unspecified: Secondary | ICD-10-CM | POA: Insufficient documentation

## 2018-01-15 DIAGNOSIS — Z79890 Hormone replacement therapy (postmenopausal): Secondary | ICD-10-CM | POA: Diagnosis not present

## 2018-01-15 DIAGNOSIS — R0600 Dyspnea, unspecified: Secondary | ICD-10-CM | POA: Diagnosis not present

## 2018-01-15 DIAGNOSIS — I4892 Unspecified atrial flutter: Secondary | ICD-10-CM | POA: Insufficient documentation

## 2018-01-15 DIAGNOSIS — I10 Essential (primary) hypertension: Secondary | ICD-10-CM | POA: Insufficient documentation

## 2018-01-15 DIAGNOSIS — K219 Gastro-esophageal reflux disease without esophagitis: Secondary | ICD-10-CM | POA: Diagnosis not present

## 2018-01-15 DIAGNOSIS — I4891 Unspecified atrial fibrillation: Secondary | ICD-10-CM | POA: Diagnosis not present

## 2018-01-15 DIAGNOSIS — F419 Anxiety disorder, unspecified: Secondary | ICD-10-CM | POA: Insufficient documentation

## 2018-01-15 NOTE — Progress Notes (Signed)
Primary Care Physician: Tisovec, Fransico Him, MD Referring Physician: Dr. [name] is a 84 y.o. male with a h/o typical atrial flutter, on amiodarone for evaluation in the afib clinic. He saw Dr. [name] in August and was c/o of shortness of breath. There was concern of amiodarone toxicity and pt asked to reduce amio to 100 mg a day. He had refused typical atrial flutter ablation in the past. He saw pulmonology yesterday and found to be in rapid atrial fibrillation. Dr. [name]  was notified  and he was asked to have f/u here. He increased his amio back to 200 mg yesterday and today. Pulmonology is planning to repeat PFT's when his heart rate is controlled. 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 Pulmonology  questioned if lungs were also involved contributing to increased shortness of breath.Pt is unaware of his RVR. He seems to be tolerating well.  F/u in afib clinic 10/1.Pt was scheduled to have a cardioversion but he went back into SR with increased dose of amiodarone to 200 mg bid.He was then instructed to lower to 200 mg daily. He was seen in ENTand has to have occasional  thickened dried nasal secretions removed,2/2 Wegner's disease. This was very painful and he noted he was in afib again this am.  F/u in afib clinic, 11/8. He saw Dr. [name], 10/10. Amiodarone was stopped and multaq was started. He reports that he has felt very well and has not  noted any afib. However, his EKG today shows afib. He  was surprised. He  did notice that he felt tired today. He worked in the yard most of yesterday and felt great.  F/u in afib clinic, 11/18, pt is in [place]. He has been in SR since I last saw him. He reviews with me his HR readings at home and has been in the 60's. On last visit here,  he was running around 110 in atrial flutter.  Today, he denies symptoms of  chest pain, shortness of breath, orthopnea, PND, lower extremity edema, dizziness, presyncope, syncope, or  neurologic sequela. The patient is tolerating medications without difficulties and is otherwise without complaint today.   Past Medical History:  Diagnosis Date  . Anxiety   . Asthma   . Asthmatic bronchitis   . Atrial flutter (HCC)    chads2vasc score is 3  . Ehrlichiosis 2956  . GERD (gastroesophageal reflux disease)   . Granulomatosis with polyangiitis ([place])   . HTN (hypertension)   . Hyperlipidemia   . Paroxysmal atrial fibrillation (HCC)   . Prostate cancer ([place])   . Seasonal allergies    Past Surgical History:  Procedure Laterality Date  . BASAL CELL CARCINOMA EXCISION  2013   EYELID  . HERNIA REPAIR Bilateral 2000, 2002  . KNEE ARTHROSCOPY  2000  . prostectomy  1995   RADICAL  . TONSILLECTOMY  1940    Current Outpatient Medications  Medication Sig Dispense Refill  . albuterol (PROVENTIL HFA;VENTOLIN HFA) 108 (90 Base) MCG/ACT inhaler Inhale 1-2 puffs into the lungs every 6 (six) hours as needed for wheezing or shortness of breath. 3 Inhaler 3  . ALPRAZolam (XANAX) 0.25 MG tablet Take 0.125 mg by mouth at bedtime as needed for sleep.     Marland Kitchen apixaban (ELIQUIS) 5 MG TABS tablet Take 1 tablet (5 mg total) by mouth 2 (two) times daily. 180 tablet 3  . bimatoprost (LUMIGAN) 0.03 % ophthalmic solution Place 1 drop into both  eyes at bedtime.     . budesonide-formoterol (SYMBICORT) 160-4.5 MCG/ACT inhaler Inhale 2 puffs into the lungs 2 (two) times daily. 1 Inhaler 5  . calcium carbonate (OSCAL) 1500 (600 Ca) MG TABS tablet Take 600 mg of elemental calcium by mouth 2 (two) times daily with a meal.    . diltiazem (CARDIZEM CD) 120 MG 24 hr capsule Take 1 capsule (120 mg total) by mouth 2 (two) times daily. 60 capsule 6  . diphenhydrAMINE (BENADRYL) 25 MG tablet Take 50 mg by mouth at bedtime.     . dronedarone (MULTAQ) 400 MG tablet Take 1 tablet (400 mg total) by mouth 2 (two) times daily with a meal. 60 tablet 11  . folic acid (FOLVITE) 1 MG tablet Take 1 mg by mouth daily.      Marland Kitchen levocetirizine (XYZAL) 5 MG tablet Take 1 tablet (5 mg total) by mouth every evening. 93 tablet 3  . levothyroxine (SYNTHROID, LEVOTHROID) 75 MCG tablet Take 75 mcg by mouth daily before breakfast.    . methotrexate (RHEUMATREX) 2.5 MG tablet Take 25 mg by mouth every Wednesday. Caution:Chemotherapy. Protect from light.     . montelukast (SINGULAIR) 10 MG tablet Take 10 mg at bedtime by mouth.    . Multiple Vitamin (MULTIVITAMIN WITH MINERALS) TABS tablet Take 1 tablet daily by mouth.    . Multiple Vitamins-Minerals (OCUVITE PRESERVISION PO) Take 1 tablet 2 (two) times daily by mouth.    Mckinley Jewel Dimesylate (RHOPRESSA) 0.02 % SOLN Place 1 drop into the right eye daily.    Marland Kitchen omeprazole (PRILOSEC) 40 MG capsule Take 40 mg daily by mouth.    . predniSONE (DELTASONE) 5 MG tablet Take 2.5 mg by mouth daily with breakfast.     . simvastatin (ZOCOR) 10 MG tablet Take 10 mg by mouth every evening.     Marland Kitchen SPIRIVA RESPIMAT 2.5 MCG/ACT AERS Inhale 2 puffs into the lungs daily.  5  . triamcinolone (NASACORT ALLERGY 24HR) 55 MCG/ACT AERO nasal inhaler Place 2 sprays 2 (two) times daily into the nose.     No current facility-administered medications for this encounter.     Allergies  Allergen Reactions  . Penicillins Hives    Has patient had a PCN reaction causing immediate rash, facial/tongue/throat swelling, SOB or lightheadedness with hypotension: No Has patient had a PCN reaction causing severe rash involving mucus membranes or skin necrosis: No Has patient had a PCN reaction that required hospitalization: No Has patient had a PCN reaction occurring within the last 10 years: Yes If all of the above answers are "NO", then may proceed with Cephalosporin use.   . Sulfa Antibiotics Other (See Comments)    Unknown to patient   . Latex Hives and Rash    Social History   Socioeconomic History  . Marital status: Married    Spouse name: [name]  . Number of children: 2  . Years of  education: Not on file  . Highest education level: Not on file  Occupational History  . Occupation: RETIRED  Social Needs  . Financial resource strain: Not on file  . Food insecurity:    Worry: Not on file    Inability: Not on file  . Transportation needs:    Medical: Not on file    Non-medical: Not on file  Tobacco Use  . Smoking status: Former Smoker    Packs/day: 0.50    Years: 4.00    Pack years: 2.00    Types: Cigarettes  Last attempt to quit: 02/28/1961    Years since quitting: 56.9  . Smokeless tobacco: Never Used  Substance and Sexual Activity  . Alcohol use: Yes  . Drug use: No  . Sexual activity: Not on file  Lifestyle  . Physical activity:    Days per week: Not on file    Minutes per session: Not on file  . Stress: Not on file  Relationships  . Social connections:    Talks on phone: Not on file    Gets together: Not on file    Attends religious service: Not on file    Active member of club or organization: Not on file    Attends meetings of clubs or organizations: Not on file    Relationship status: Not on file  . Intimate partner violence:    Fear of current or ex partner: Not on file    Emotionally abused: Not on file    Physically abused: Not on file    Forced sexual activity: Not on file  Other Topics Concern  . Not on file  Social History Narrative   Pt recently moved to [place] from [place] to be near his son.   Retired.    Family History  Problem Relation Age of Onset  . Asthma Mother 48  . Allergies Mother   . Ulcers Mother   . CVA Father 31  . Heart Problems Father   . Other Father        NEUROLOGIC ISSUES   . Other Brother 11       BICYCLE ACCIDENT  . Migraines Son   . Diabetes Mellitus I Son     ROS- All systems are reviewed and negative except as per the HPI above  Physical Exam: Vitals:   01/15/18 1030  BP: (!) 164/62  Pulse: 67  Weight: 75.3 kg  Height: 5\' 9"  (1.753 m)   Wt Readings from Last 3 Encounters:    01/15/18 75.3 kg  01/05/18 73.9 kg  12/27/17 74.5 kg    Labs: Lab Results  Component Value Date   NA 139 12/01/2017   K 4.3 12/01/2017   CL 103 12/01/2017   CO2 27 12/01/2017   GLUCOSE 123 (H) 12/01/2017   BUN 27 (H) 12/01/2017   CREATININE 1.63 (H) 12/01/2017   CALCIUM 10.2 12/01/2017   No results found for: INR No results found for: CHOL, HDL, LDLCALC, TRIG   GEN- The patient is well appearing, alert and oriented x 3 today.   Head- normocephalic, atraumatic Eyes-  Sclera clear, conjunctiva pink Ears- hearing intact Oropharynx- clear Neck- supple, no JVP Lymph- no cervical lymphadenopathy Lungs- Clear to ausculation bilaterally, normal work of breathing Heart- regular rate and rhythm, no murmurs, rubs or gallops, PMI not laterally displaced GI- soft, NT, ND, + BS Extremities- no clubbing, cyanosis, or edema MS- no significant deformity or atrophy Skin- no rash or lesion Psych- euthymic mood, full affect Neuro- strength and sensation are intact  EKG- NSR at 67 bpm, Pr int 172 ms, qrs int 154 ms, qtc 551 ms  reviewed with Dr. [name] and he feels qtc is acceptable (RBBB contributing)    Assessment and Plan: 1. Afib/flutter with RVR Amiodarone stopped and pt  has been on Multaq  He has been for the most part staying in SR Continue cardizem 120 mg bid for rate control  2. Dyspnea/ small vessel vasculitis Per pulmonology   F/u with Dr. [name] 1/15  Chad Morrison. Chad Morrison, [place]  [facility] [address] [place], [place] [address] [phone]

## 2018-01-15 NOTE — Telephone Encounter (Signed)
Called and spoke with Dr. Francesco Runner secretary at [phone]. Dr. Vinetta Bergamo is currently out of the office but left both the new office number and gave Dr. Anastasia Pall personal number. She stated she will have him call Dr. Lake Bells when he is back in the office.

## 2018-01-17 DIAGNOSIS — H40053 Ocular hypertension, bilateral: Secondary | ICD-10-CM | POA: Diagnosis not present

## 2018-01-17 DIAGNOSIS — H52203 Unspecified astigmatism, bilateral: Secondary | ICD-10-CM | POA: Diagnosis not present

## 2018-01-17 DIAGNOSIS — H25813 Combined forms of age-related cataract, bilateral: Secondary | ICD-10-CM | POA: Diagnosis not present

## 2018-01-17 DIAGNOSIS — H353211 Exudative age-related macular degeneration, right eye, with active choroidal neovascularization: Secondary | ICD-10-CM | POA: Diagnosis not present

## 2018-01-18 ENCOUNTER — Encounter (HOSPITAL_COMMUNITY): Payer: Self-pay

## 2018-01-18 ENCOUNTER — Observation Stay (HOSPITAL_COMMUNITY)
Admission: EM | Admit: 2018-01-18 | Discharge: 2018-01-19 | Disposition: A | Discharge status: Home / Self Care | Payer: Medicare Other | Attending: Otolaryngology | Admitting: Otolaryngology

## 2018-01-18 DIAGNOSIS — M25671 Stiffness of right ankle, not elsewhere classified: Secondary | ICD-10-CM | POA: Diagnosis not present

## 2018-01-18 DIAGNOSIS — Z7984 Long term (current) use of oral hypoglycemic drugs: Secondary | ICD-10-CM | POA: Diagnosis not present

## 2018-01-18 DIAGNOSIS — J449 Chronic obstructive pulmonary disease, unspecified: Secondary | ICD-10-CM | POA: Insufficient documentation

## 2018-01-18 DIAGNOSIS — Z8546 Personal history of malignant neoplasm of prostate: Secondary | ICD-10-CM | POA: Diagnosis not present

## 2018-01-18 DIAGNOSIS — E785 Hyperlipidemia, unspecified: Secondary | ICD-10-CM | POA: Insufficient documentation

## 2018-01-18 DIAGNOSIS — Z880 Allergy status to penicillin: Secondary | ICD-10-CM | POA: Diagnosis not present

## 2018-01-18 DIAGNOSIS — Z79890 Hormone replacement therapy (postmenopausal): Secondary | ICD-10-CM | POA: Insufficient documentation

## 2018-01-18 DIAGNOSIS — R040 Epistaxis: Principal | ICD-10-CM | POA: Insufficient documentation

## 2018-01-18 DIAGNOSIS — J3489 Other specified disorders of nose and nasal sinuses: Secondary | ICD-10-CM | POA: Insufficient documentation

## 2018-01-18 DIAGNOSIS — J45909 Unspecified asthma, uncomplicated: Secondary | ICD-10-CM | POA: Diagnosis not present

## 2018-01-18 DIAGNOSIS — Z79899 Other long term (current) drug therapy: Secondary | ICD-10-CM | POA: Diagnosis not present

## 2018-01-18 DIAGNOSIS — Z7952 Long term (current) use of systemic steroids: Secondary | ICD-10-CM | POA: Diagnosis not present

## 2018-01-18 DIAGNOSIS — Z7951 Long term (current) use of inhaled steroids: Secondary | ICD-10-CM | POA: Insufficient documentation

## 2018-01-18 DIAGNOSIS — Z882 Allergy status to sulfonamides status: Secondary | ICD-10-CM | POA: Insufficient documentation

## 2018-01-18 DIAGNOSIS — M25471 Effusion, right ankle: Secondary | ICD-10-CM | POA: Diagnosis not present

## 2018-01-18 DIAGNOSIS — I10 Essential (primary) hypertension: Secondary | ICD-10-CM | POA: Diagnosis not present

## 2018-01-18 DIAGNOSIS — M25674 Stiffness of right foot, not elsewhere classified: Secondary | ICD-10-CM | POA: Diagnosis not present

## 2018-01-18 DIAGNOSIS — I480 Paroxysmal atrial fibrillation: Secondary | ICD-10-CM | POA: Diagnosis not present

## 2018-01-18 DIAGNOSIS — Z87891 Personal history of nicotine dependence: Secondary | ICD-10-CM | POA: Insufficient documentation

## 2018-01-18 DIAGNOSIS — M3130 Wegener's granulomatosis without renal involvement: Secondary | ICD-10-CM | POA: Insufficient documentation

## 2018-01-18 DIAGNOSIS — R0902 Hypoxemia: Secondary | ICD-10-CM | POA: Diagnosis not present

## 2018-01-18 DIAGNOSIS — M25474 Effusion, right foot: Secondary | ICD-10-CM | POA: Diagnosis not present

## 2018-01-18 DIAGNOSIS — R58 Hemorrhage, not elsewhere classified: Secondary | ICD-10-CM | POA: Diagnosis not present

## 2018-01-18 DIAGNOSIS — Z85828 Personal history of other malignant neoplasm of skin: Secondary | ICD-10-CM | POA: Diagnosis not present

## 2018-01-18 DIAGNOSIS — K219 Gastro-esophageal reflux disease without esophagitis: Secondary | ICD-10-CM | POA: Insufficient documentation

## 2018-01-18 DIAGNOSIS — Z7901 Long term (current) use of anticoagulants: Secondary | ICD-10-CM | POA: Insufficient documentation

## 2018-01-18 DIAGNOSIS — F419 Anxiety disorder, unspecified: Secondary | ICD-10-CM | POA: Diagnosis not present

## 2018-01-18 DIAGNOSIS — D68318 Other hemorrhagic disorder due to intrinsic circulating anticoagulants, antibodies, or inhibitors: Secondary | ICD-10-CM | POA: Diagnosis not present

## 2018-01-18 DIAGNOSIS — I4891 Unspecified atrial fibrillation: Secondary | ICD-10-CM | POA: Diagnosis not present

## 2018-01-18 LAB — BASIC METABOLIC PANEL
Anion gap: 9 (ref 5–15)
BUN: 30 mg/dL — ABNORMAL HIGH (ref 8–23)
CO2: 20 mmol/L — ABNORMAL LOW (ref 22–32)
Calcium: 9.5 mg/dL (ref 8.9–10.3)
Chloride: 109 mmol/L (ref 98–111)
Creatinine, Ser: 1.38 mg/dL — ABNORMAL HIGH (ref 0.61–1.24)
GFR calc Af Amer: 53 mL/min — ABNORMAL LOW (ref >60)
GFR calc non Af Amer: 46 mL/min — ABNORMAL LOW (ref >60)
Glucose, Bld: 132 mg/dL — ABNORMAL HIGH (ref 70–99)
Potassium: 4.3 mmol/L (ref 3.5–5.1)
Sodium: 138 mmol/L (ref 135–145)

## 2018-01-18 LAB — CBC WITH DIFFERENTIAL/PLATELET
Abs Immature Granulocytes: 0.06 10*3/uL (ref 0.00–0.07)
Basophils Absolute: 0 10*3/uL (ref 0.0–0.1)
Basophils Relative: 0 %
Eosinophils Absolute: 0.1 10*3/uL (ref 0.0–0.5)
Eosinophils Relative: 1 %
HCT: 31.8 % — ABNORMAL LOW (ref 39.0–52.0)
Hemoglobin: 9.7 g/dL — ABNORMAL LOW (ref 13.0–17.0)
Immature Granulocytes: 1 %
Lymphocytes Relative: 3 %
Lymphs Abs: 0.4 10*3/uL — ABNORMAL LOW (ref 0.7–4.0)
MCH: 24.3 pg — ABNORMAL LOW (ref 26.0–34.0)
MCHC: 30.5 g/dL (ref 30.0–36.0)
MCV: 79.7 fL — ABNORMAL LOW (ref 80.0–100.0)
Monocytes Absolute: 0.6 10*3/uL (ref 0.1–1.0)
Monocytes Relative: 5 %
Neutro Abs: 9.7 10*3/uL — ABNORMAL HIGH (ref 1.7–7.7)
Neutrophils Relative %: 90 %
Platelets: 247 10*3/uL (ref 150–400)
RBC: 3.99 MIL/uL — ABNORMAL LOW (ref 4.22–5.81)
RDW: 20.1 % — ABNORMAL HIGH (ref 11.5–15.5)
WBC: 10.9 10*3/uL — ABNORMAL HIGH (ref 4.0–10.5)
nRBC: 0 % (ref 0.0–0.2)

## 2018-01-18 MED ORDER — PANTOPRAZOLE SODIUM 40 MG PO TBEC
40.0000 mg | DELAYED_RELEASE_TABLET | Freq: Every day | ORAL | Status: DC
  Administered 2018-01-19: 40 mg via ORAL
  Filled 2018-01-18: qty 1

## 2018-01-18 MED ORDER — SIMVASTATIN 20 MG PO TABS: 10.0000 mg | ORAL_TABLET | Freq: Every evening | ORAL | Status: DC

## 2018-01-18 MED ORDER — DILTIAZEM HCL ER COATED BEADS 120 MG PO CP24
120.0000 mg | ORAL_CAPSULE | Freq: Two times a day (BID) | ORAL | Status: DC
  Administered 2018-01-19: 120 mg via ORAL
  Filled 2018-01-18: qty 1

## 2018-01-18 MED ORDER — PREDNISONE 2.5 MG PO TABS
2.5000 mg | ORAL_TABLET | Freq: Every day | ORAL | Status: DC
  Administered 2018-01-19: 2.5 mg via ORAL
  Filled 2018-01-18: qty 1

## 2018-01-18 MED ORDER — ONDANSETRON HCL 4 MG/2ML IJ SOLN: 4.0000 mg | Freq: Four times a day (QID) | INTRAMUSCULAR | Status: DC | PRN

## 2018-01-18 MED ORDER — TRIAMCINOLONE ACETONIDE 55 MCG/ACT NA AERO: 2.0000 | INHALATION_SPRAY | Freq: Two times a day (BID) | NASAL | Status: DC

## 2018-01-18 MED ORDER — MOMETASONE FURO-FORMOTEROL FUM 200-5 MCG/ACT IN AERO
2.0000 | INHALATION_SPRAY | Freq: Two times a day (BID) | RESPIRATORY_TRACT | Status: DC
  Administered 2018-01-19: 2 via RESPIRATORY_TRACT
  Filled 2018-01-18: qty 8.8

## 2018-01-18 MED ORDER — ACETAMINOPHEN 325 MG PO TABS: 650.0000 mg | ORAL_TABLET | Freq: Four times a day (QID) | ORAL | Status: DC | PRN

## 2018-01-18 MED ORDER — ADULT MULTIVITAMIN W/MINERALS CH
1.0000 | ORAL_TABLET | Freq: Every day | ORAL | Status: DC
  Administered 2018-01-19: 1 via ORAL
  Filled 2018-01-18: qty 1

## 2018-01-18 MED ORDER — CALCIUM CARBONATE 1250 (500 CA) MG PO TABS
500.0000 mg | ORAL_TABLET | Freq: Two times a day (BID) | ORAL | Status: DC
  Administered 2018-01-19: 500 mg via ORAL
  Filled 2018-01-18: qty 1

## 2018-01-18 MED ORDER — ALBUTEROL SULFATE (2.5 MG/3ML) 0.083% IN NEBU: 2.5000 mg | INHALATION_SOLUTION | Freq: Four times a day (QID) | RESPIRATORY_TRACT | Status: DC | PRN

## 2018-01-18 MED ORDER — FOLIC ACID 1 MG PO TABS
1.0000 mg | ORAL_TABLET | Freq: Every day | ORAL | Status: DC
  Filled 2018-01-18: qty 1

## 2018-01-18 MED ORDER — TRANEXAMIC ACID 1000 MG/10ML IV SOLN
500.0000 mg | Freq: Once | INTRAVENOUS | Status: AC
  Administered 2018-01-18: 500 mg via TOPICAL
  Filled 2018-01-18 (×3): qty 10

## 2018-01-18 MED ORDER — NETARSUDIL DIMESYLATE 0.02 % OP SOLN: 1.0000 [drp] | Freq: Every day | OPHTHALMIC | Status: DC

## 2018-01-18 MED ORDER — ONDANSETRON HCL 4 MG PO TABS: 4.0000 mg | ORAL_TABLET | Freq: Four times a day (QID) | ORAL | Status: DC | PRN

## 2018-01-18 MED ORDER — DRONEDARONE HCL 400 MG PO TABS
400.0000 mg | ORAL_TABLET | Freq: Two times a day (BID) | ORAL | Status: DC
  Filled 2018-01-18: qty 1

## 2018-01-18 MED ORDER — ALPRAZOLAM 0.25 MG PO TABS
0.1250 mg | ORAL_TABLET | Freq: Every evening | ORAL | Status: DC | PRN
  Administered 2018-01-19: 0.125 mg via ORAL
  Filled 2018-01-18: qty 1

## 2018-01-18 MED ORDER — APIXABAN 5 MG PO TABS
5.0000 mg | ORAL_TABLET | Freq: Two times a day (BID) | ORAL | Status: DC
  Administered 2018-01-19: 5 mg via ORAL
  Filled 2018-01-18: qty 1

## 2018-01-18 MED ORDER — HYDROCODONE-ACETAMINOPHEN 5-325 MG PO TABS
1.0000 | ORAL_TABLET | ORAL | Status: DC | PRN
  Administered 2018-01-19: 2 via ORAL
  Administered 2018-01-19: 1 via ORAL
  Filled 2018-01-18: qty 2
  Filled 2018-01-18: qty 1

## 2018-01-18 MED ORDER — LORATADINE 10 MG PO TABS: 10.0000 mg | ORAL_TABLET | Freq: Every evening | ORAL | Status: DC

## 2018-01-18 MED ORDER — ACETAMINOPHEN 650 MG RE SUPP: 650.0000 mg | Freq: Four times a day (QID) | RECTAL | Status: DC | PRN

## 2018-01-18 MED ORDER — METHOTREXATE 2.5 MG PO TABS: 25.0000 mg | ORAL_TABLET | ORAL | Status: DC

## 2018-01-18 MED ORDER — DIPHENHYDRAMINE HCL 25 MG PO CAPS
50.0000 mg | ORAL_CAPSULE | Freq: Every day | ORAL | Status: DC
  Administered 2018-01-19: 50 mg via ORAL
  Filled 2018-01-18: qty 2

## 2018-01-18 MED ORDER — LEVOTHYROXINE SODIUM 75 MCG PO TABS
75.0000 ug | ORAL_TABLET | Freq: Every day | ORAL | Status: DC
  Administered 2018-01-19: 75 ug via ORAL
  Filled 2018-01-18: qty 1

## 2018-01-18 MED ORDER — MONTELUKAST SODIUM 10 MG PO TABS
10.0000 mg | ORAL_TABLET | Freq: Every day | ORAL | Status: DC
  Administered 2018-01-19: 10 mg via ORAL
  Filled 2018-01-18: qty 1

## 2018-01-18 MED ORDER — DEXTROSE-NACL 5-0.45 % IV SOLN
INTRAVENOUS | Status: DC
  Administered 2018-01-19: 01:00:00 via INTRAVENOUS

## 2018-01-18 NOTE — ED Notes (Signed)
ENT Provider at bedside 

## 2018-01-18 NOTE — ED Provider Notes (Signed)
[facility] EMERGENCY DEPARTMENT Provider Note   CSN: [id number] Arrival date & time: 01/18/18  2021     History   Chief Complaint Chief Complaint  Patient presents with  . Epistaxis    HPI Eliakim Tendler is a 84 y.o. male.  Patient is an 84 year old male with past medical history of atrial flutter, hypertension, paroxysmal atrial fibrillation on Eliquis.  He presents today with complaints of nosebleed.  He reports intermittent bleeding for the past couple of days in the absence of any injury or trauma.  The past 2 days, he has been able to stop it on his own, however this evening has not resolved with direct pressure.  The history is provided by the patient.  Epistaxis   This is a new problem. The current episode started 2 days ago. The problem occurs constantly. The problem has been gradually worsening. The problem is associated with anticoagulants. The bleeding has been from the left nare. He has tried nothing for the symptoms. The treatment provided no relief.    Past Medical History:  Diagnosis Date  . Anxiety   . Asthma   . Asthmatic bronchitis   . Atrial flutter (HCC)    chads2vasc score is 3  . Ehrlichiosis 4098  . GERD (gastroesophageal reflux disease)   . 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 HTN (hypertension)   . Hyperlipidemia   . Paroxysmal atrial fibrillation (HCC)   . Prostate cancer ([place])   . Seasonal allergies     Patient Active Problem List   Diagnosis Date Noted  . Shortness of breath 12/01/2017  . Atrial fibrillation ([place]) 11/08/2017  . COPD (chronic obstructive pulmonary disease) ([place]) 08/08/2017  . Chronic rhinitis 08/08/2017    Past Surgical History:  Procedure Laterality Date  . BASAL CELL CARCINOMA EXCISION  2013   EYELID  . HERNIA REPAIR Bilateral 2000, 2002  . KNEE ARTHROSCOPY  2000  . prostectomy  1995   RADICAL  . TONSILLECTOMY  1940        Home Medications    Prior to Admission medications     Medication Sig Start Date End Date Taking? Authorizing Provider  albuterol (PROVENTIL HFA;VENTOLIN HFA) 108 (90 Base) MCG/ACT inhaler Inhale 1-2 puffs into the lungs every 6 (six) hours as needed for wheezing or shortness of breath. 06/20/17   Juanito Doom, MD  ALPRAZolam Duanne Moron) 0.25 MG tablet Take 0.125 mg by mouth at bedtime as needed for sleep.     [provider]  apixaban (ELIQUIS) 5 MG TABS tablet Take 1 tablet (5 mg total) by mouth 2 (two) times daily. 06/12/17   Allred, Jeneen Rinks, MD  bimatoprost (LUMIGAN) 0.03 % ophthalmic solution Place 1 drop into both eyes at bedtime.     [provider]  budesonide-formoterol (SYMBICORT) 160-4.5 MCG/ACT inhaler Inhale 2 puffs into the lungs 2 (two) times daily. 10/25/17   Juanito Doom, MD  calcium carbonate (OSCAL) 1500 (600 Ca) MG TABS tablet Take 600 mg of elemental calcium by mouth 2 (two) times daily with a meal.    [provider]  diltiazem (CARDIZEM CD) 120 MG 24 hr capsule Take 1 capsule (120 mg total) by mouth 2 (two) times daily. 11/24/17 11/24/18  Sherran Needs, NP  diphenhydrAMINE (BENADRYL) 25 MG tablet Take 50 mg by mouth at bedtime.     [provider]  dronedarone (MULTAQ) 400 MG tablet Take 1 tablet (400 mg total) by mouth 2 (two) times daily with a meal.  12/07/17   Allred, Jeneen Rinks, MD  folic acid (FOLVITE) 1 MG tablet Take 1 mg by mouth daily.    [provider]  levocetirizine (XYZAL) 5 MG tablet Take 1 tablet (5 mg total) by mouth every evening. 10/24/17   Juanito Doom, MD  levothyroxine (SYNTHROID, LEVOTHROID) 75 MCG tablet Take 75 mcg by mouth daily before breakfast.    [provider]  methotrexate (RHEUMATREX) 2.5 MG tablet Take 25 mg by mouth every Wednesday. Caution:Chemotherapy. Protect from light.     [provider]  montelukast (SINGULAIR) 10 MG tablet Take 10 mg at bedtime by mouth.    [provider]  Multiple Vitamin (MULTIVITAMIN WITH  MINERALS) TABS tablet Take 1 tablet daily by mouth.    [provider]  Multiple Vitamins-Minerals (OCUVITE PRESERVISION PO) Take 1 tablet 2 (two) times daily by mouth.    [provider]  Netarsudil Dimesylate (RHOPRESSA) 0.02 % SOLN Place 1 drop into the right eye daily. 10/01/17   [provider]  omeprazole (PRILOSEC) 40 MG capsule Take 40 mg daily by mouth.    [provider]  predniSONE (DELTASONE) 5 MG tablet Take 2.5 mg by mouth daily with breakfast.     [provider]  simvastatin (ZOCOR) 10 MG tablet Take 10 mg by mouth every evening.     [provider]  SPIRIVA RESPIMAT 2.5 MCG/ACT AERS Inhale 2 puffs into the lungs daily. 11/26/17   [provider]  triamcinolone (NASACORT ALLERGY 24HR) 55 MCG/ACT AERO nasal inhaler Place 2 sprays 2 (two) times daily into the nose.    [provider]    Family History Family History  Problem Relation Age of Onset  . Asthma Mother 41  . Allergies Mother   . Ulcers Mother   . CVA Father 50  . Heart Problems Father   . Other Father        NEUROLOGIC ISSUES   . Other Brother 11       BICYCLE ACCIDENT  . Migraines Son   . Diabetes Mellitus I Son     Social History Social History   Tobacco Use  . Smoking status: Former Smoker    Packs/day: 0.50    Years: 4.00    Pack years: 2.00    Types: Cigarettes    Last attempt to quit: 02/28/1961    Years since quitting: 56.9  . Smokeless tobacco: Never Used  Substance Use Topics  . Alcohol use: Yes  . Drug use: No     Allergies   Penicillins; Sulfa antibiotics; and Latex   Review of Systems Review of Systems  Constitutional: Negative for fever.       10 Systems reviewed and are negative for acute change except as noted in the HPI.  HENT: Positive for nosebleeds. Negative for congestion.   Eyes: Negative for discharge and redness.  Respiratory: Negative for cough and shortness of breath.   Cardiovascular: Negative  for chest pain.  Gastrointestinal: Negative for abdominal pain and vomiting.  Musculoskeletal: Negative for back pain.  Skin: Negative for rash.  Neurological: Negative for syncope, numbness and headaches.  Psychiatric/Behavioral:       No behavior change.     Physical Exam Updated Vital Signs BP (!) 192/63 (BP Location: Right Arm)   Pulse 77   Temp 98.3 F (36.8 C) (Oral)   Resp 16   Ht 5\' 10"  (1.778 m)   Wt 74.8 kg   SpO2 95%   BMI 23.68 kg/m  Physical Exam  Constitutional: He is oriented to person, place, and time. He appears well-developed and well-nourished. No distress.  HENT:  Head: Normocephalic and atraumatic.  There is a steady trickle of blood from the left nares.  It is controlled with direct pressure.  Neck: Normal range of motion. Neck supple.  Pulmonary/Chest: Effort normal.  Neurological: He is alert and oriented to person, place, and time.  Skin: Skin is warm and dry. He is not diaphoretic.  Nursing note and vitals reviewed.    ED Treatments / Results  Labs (all labs ordered are listed, but only abnormal results are displayed) Labs Reviewed - No data to display  EKG None  Radiology No results found.  Procedures Procedures (including critical care time)  Medications Ordered in ED Medications  tranexamic acid (CYKLOKAPRON) injection 500 mg (has no administration in time range)     Initial Impression / Assessment and Plan / ED Course  I have reviewed the triage vital signs and the nursing notes.  Pertinent labs & imaging results that were available during my care of the patient were reviewed by me and considered in my medical decision making (see chart for details).  Patient presents with nosebleed while on Eliquis.  Several attempts were made at packing, however were unsuccessful.  The patient continued to bleed down the back of his throat and around the packing.  I have discussed with Dr. Constance Holster from ENT who will evaluate the patient in the  ER.  Bilateral packings were placed by Dr. Constance Holster and it appears as though the patient will be admitted for observation overnight.  Final Clinical Impressions(s) / ED Diagnoses   Final diagnoses:  None    ED Discharge Orders    None       Veryl Speak, MD 01/18/18 2312

## 2018-01-18 NOTE — H&P (Signed)
Chad Morrison is an 84 y.o. male.   Chief Complaint: Nosebleed HPI: History of Wegener's granulomatosis.  History of atrial fibrillation on anticoagulation therapy.  Has had nosebleeds in the past.  Has had nasal and sinus surgery in the past.  Has a known septal perforation.  Last seen at [facility] about a month ago.  He has had severe nosebleed since about 5:00 this afternoon and has been in the emergency department for several hours and multiple attempts at treatment have failed.  He continues to have bleeding out the nose and down into his pharynx.  Past Medical History:  Diagnosis Date  . Anxiety   . Asthma   . Asthmatic bronchitis   . Atrial flutter (HCC)    chads2vasc score is 3  . Ehrlichiosis 9485  . GERD (gastroesophageal reflux disease)   . 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 HTN (hypertension)   . Hyperlipidemia   . Paroxysmal atrial fibrillation (HCC)   . Prostate cancer ([place])   . Seasonal allergies     Past Surgical History:  Procedure Laterality Date  . BASAL CELL CARCINOMA EXCISION  2013   EYELID  . HERNIA REPAIR Bilateral 2000, 2002  . KNEE ARTHROSCOPY  2000  . prostectomy  1995   RADICAL  . TONSILLECTOMY  1940    Family History  Problem Relation Age of Onset  . Asthma Mother 39  . Allergies Mother   . Ulcers Mother   . CVA Father 76  . Heart Problems Father   . Other Father        NEUROLOGIC ISSUES   . Other Brother 11       BICYCLE ACCIDENT  . Migraines Son   . Diabetes Mellitus I Son    Social History:  reports that he quit smoking about 56 years ago. His smoking use included cigarettes. He has a 2.00 pack-year smoking history. He has never used smokeless tobacco. He reports that he drinks alcohol. He reports that he does not use drugs.  Allergies:  Allergies  Allergen Reactions  . Penicillins Hives    Has patient had a PCN reaction causing immediate rash, facial/tongue/throat swelling, SOB or lightheadedness with hypotension: No Has  patient had a PCN reaction causing severe rash involving mucus membranes or skin necrosis: No Has patient had a PCN reaction that required hospitalization: No Has patient had a PCN reaction occurring within the last 10 years: Yes If all of the above answers are "NO", then may proceed with Cephalosporin use.   . Sulfa Antibiotics Other (See Comments)    Unknown to patient   . Latex Hives and Rash     (Not in a hospital admission)  Results for orders placed or performed during the hospital encounter of 01/18/18 (from the past 48 hour(s))  Basic metabolic panel     Status: Abnormal   Collection Time: 01/18/18  9:42 PM  Result Value Ref Range   Sodium 138 135 - 145 mmol/L   Potassium 4.3 3.5 - 5.1 mmol/L   Chloride 109 98 - 111 mmol/L   CO2 20 (L) 22 - 32 mmol/L   Glucose, Bld 132 (H) 70 - 99 mg/dL   BUN 30 (H) 8 - 23 mg/dL   Creatinine, Ser 1.38 (H) 0.61 - 1.24 mg/dL   Calcium 9.5 8.9 - 10.3 mg/dL   GFR calc non Af Amer 46 (L) >60 mL/min   GFR calc Af Amer 53 (L) >60 mL/min    Comment: (NOTE) The eGFR  has been calculated using the CKD EPI equation. This calculation has not been validated in all clinical situations. eGFR's persistently <60 mL/min signify possible Chronic Kidney Disease.    Anion gap 9 5 - 15    Comment: Performed at [facility] [address] [place], [place] [address]  CBC with Differential     Status: Abnormal   Collection Time: 01/18/18  9:42 PM  Result Value Ref Range   WBC 10.9 (H) 4.0 - 10.5 K/uL   RBC 3.99 (L) 4.22 - 5.81 MIL/uL   Hemoglobin 9.7 (L) 13.0 - 17.0 g/dL   HCT 31.8 (L) 39.0 - 52.0 %   MCV 79.7 (L) 80.0 - 100.0 fL   MCH 24.3 (L) 26.0 - 34.0 pg   MCHC 30.5 30.0 - 36.0 g/dL   RDW 20.1 (H) 11.5 - 15.5 %   Platelets 247 150 - 400 K/uL   nRBC 0.0 0.0 - 0.2 %   Neutrophils Relative % 90 %   Neutro Abs 9.7 (H) 1.7 - 7.7 K/uL   Lymphocytes Relative 3 %   Lymphs Abs 0.4 (L) 0.7 - 4.0 K/uL   Monocytes Relative 5 %   Monocytes  Absolute 0.6 0.1 - 1.0 K/uL   Eosinophils Relative 1 %   Eosinophils Absolute 0.1 0.0 - 0.5 K/uL   Basophils Relative 0 %   Basophils Absolute 0.0 0.0 - 0.1 K/uL   Immature Granulocytes 1 %   Abs Immature Granulocytes 0.06 0.00 - 0.07 K/uL    Comment: Performed at [facility], [place] [address] [place], [place] [address]   No results found.  ROS: otherwise negative  Blood pressure (!) 185/95, pulse 74, temperature 98.3 F (36.8 C), temperature source Oral, resp. rate 16, height _0  (1.778 m), weight 74.8 kg, SpO2 96 %.  PHYSICAL EXAM: Overall appearance:  Healthy appearing, in no distress, blood running out of his nasal cavities. Head:  Normocephalic, atraumatic. Ears: External ears look healthy. Nose: External nose is healthy in appearance.  Oral Cavity/pharynx:  There are no mucosal lesions or masses identified.  Large clots were evacuated from the oropharynx. Neuro:  No identifiable neurologic deficits. Neck: No palpable neck masses.  Studies Reviewed: none  Procedure:   2 small packs in the left nasal cavity both removed.  Large amount of clotting filling the nasal cavities.  This was all evacuated.  Topical Afrin/lidocaine was used in spray form and on pledgets.  A large septal perforation was identified.  There is no active granulation tissue or crusting present today.  There is brisk bleeding from the anterior aspect of the inferior turbinate on the left.  Multiple attempts to cauterize this with silver nitrate were not successful.  The nasal cavities were infiltrated with Xylocaine/epinephrine solution into the floor of the nose, the septum, the inferior turbinates.  Bilateral long flat Merisel packs were placed.  I could not pass them all the way back and the remaining approximately 2 cm on both sides was cut off.  Surgicel was placed laterally to the left pack along the obvious bleeding site.  The packs were infiltrated with local anesthetic solution.  He was observed  for about 20 minutes and there was no further bleeding..  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  Bilateral packing placed.  Thus far no further bleeding.  Recommend admission to the hospital for overnight observation and if he has recurrent  bleeding we will need to take him to the operating room to attempt to treat this more aggressively.  Izora Gala 01/18/2018, 11:11 PM

## 2018-01-18 NOTE — ED Triage Notes (Signed)
Patient Chad Morrison for nosebleed x 2 hours. Patient takinf Eliquis for Afib. Patient reports history of Wegner's which can cause frequent nosebleeds. EMS gave Afrin which helped in the RIGHT nares but the LEFT continues to bleed. Patient states "I feel it running down the back of my throat, it just wont stop". Denies chest pain or SOB. VSS. Patient alert and orient.

## 2018-01-19 ENCOUNTER — Other Ambulatory Visit: Payer: Self-pay

## 2018-01-19 ENCOUNTER — Telehealth: Payer: Self-pay | Admitting: Pulmonary Disease

## 2018-01-19 DIAGNOSIS — R040 Epistaxis: Secondary | ICD-10-CM | POA: Diagnosis not present

## 2018-01-19 LAB — CBC
HCT: 26.6 % — ABNORMAL LOW (ref 39.0–52.0)
Hemoglobin: 8.3 g/dL — ABNORMAL LOW (ref 13.0–17.0)
MCH: 24.3 pg — ABNORMAL LOW (ref 26.0–34.0)
MCHC: 31.2 g/dL (ref 30.0–36.0)
MCV: 77.8 fL — ABNORMAL LOW (ref 80.0–100.0)
Platelets: 220 10*3/uL (ref 150–400)
RBC: 3.42 MIL/uL — ABNORMAL LOW (ref 4.22–5.81)
RDW: 19.9 % — ABNORMAL HIGH (ref 11.5–15.5)
WBC: 5.5 10*3/uL (ref 4.0–10.5)
nRBC: 0 % (ref 0.0–0.2)

## 2018-01-19 MED ORDER — ORAL CARE MOUTH RINSE: 15.0000 mL | Freq: Two times a day (BID) | OROMUCOSAL | Status: DC

## 2018-01-19 MED ORDER — HYDROCODONE-ACETAMINOPHEN 7.5-325 MG PO TABS: 1.0000 | ORAL_TABLET | ORAL | 0 refills | Status: DC | PRN

## 2018-01-19 NOTE — Telephone Encounter (Signed)
Pt aware of recs.  Nothing further needed. 

## 2018-01-19 NOTE — Progress Notes (Signed)
Patient ID: Chad Morrison, male   DOB: 1932/03/17, 84 y.o.   MRN: [id number] Leg bloody drainage during the night but no significant bleeding.  Hemoglobin down to 8.3 from 11.  Packing in place.  No bleeding currently.  He is awake and alert.  Discharge home.  Follow-up Tuesday for packing removal.

## 2018-01-19 NOTE — Discharge Instructions (Signed)
Take it easy all weekend, avoid any strenuous activity.  It is okay to keep some gauze under the nose to catch any dripping.  Call our office or come to the emergency department if there is any heavy bleeding.

## 2018-01-19 NOTE — Telephone Encounter (Signed)
Yes it is expected to be harder to breath with nasal packing . Definitely will need to sleep in incline if possible .   Mouth breathing causes dryness and throat. Frequent sips of water.   As long as no worsening sob or bleeding , not much to do   Please contact office for sooner follow up if symptoms do not improve or worsen or seek emergency care

## 2018-01-19 NOTE — Progress Notes (Signed)
Pt verbalized understanding plan of care. Pt educated to not attempt to blow nose, or cough vigorously. Pt placed NPO per provider's order but keeps attempting to drink fluids. Pt continuously tries to irritate nose by coughing attempting to clear nose. Nose packing in place. Call light within reach, will continue to monitor.

## 2018-01-19 NOTE — Discharge Summary (Signed)
Physician Discharge Summary  Patient ID: Chad Morrison MRN: [id number] DOB/AGE: 1932-12-27 84 y.o.  Admit date: 01/18/2018 Discharge date: 01/19/2018  Admission Diagnoses: Epistaxis, anticoagulated, atrial fibrillation, Wegener's granulomatosis, nasal septal perforation.  Discharge Diagnoses:  Active Problems:   Epistaxis   Discharged Condition: fair  Hospital Course: Bilateral nasal packing placed in the emergency department.  Admitted overnight for observation.  No further bleeding.  Consults: none  Significant Diagnostic Studies: none  Treatments: Nasal packing  Discharge Exam: Blood pressure (!) 153/82, pulse (!) 58, temperature 97.6 F (36.4 C), temperature source Axillary, resp. rate 17, height 5\' 10"  (1.778 m), weight 74.8 kg, SpO2 94 %. PHYSICAL EXAM: He is awake and alert this morning.  Nasal packing in place.  No active bleeding.  Disposition: Discharge disposition: 01-Home or Self Care       Discharge Instructions    Diet - low sodium heart healthy   Complete by:  As directed    Increase activity slowly   Complete by:  As directed      Allergies as of 01/19/2018      Reactions   Penicillins Hives   Has patient had a PCN reaction causing immediate rash, facial/tongue/throat swelling, SOB or lightheadedness with hypotension: No Has patient had a PCN reaction causing severe rash involving mucus membranes or skin necrosis: No Has patient had a PCN reaction that required hospitalization: No Has patient had a PCN reaction occurring within the last 10 years: Yes If all of the above answers are "NO", then may proceed with Cephalosporin use.   Sulfa Antibiotics Other (See Comments)   Unknown to patient   Latex Hives, Rash      Medication List    TAKE these medications   albuterol 108 (90 Base) MCG/ACT inhaler Commonly known as:  PROVENTIL HFA;VENTOLIN HFA Inhale 1-2 puffs into the lungs every 6 (six) hours as needed for wheezing or shortness of breath.   ALPRAZolam 0.25 MG tablet Commonly known as:  XANAX Take 0.125 mg by mouth at bedtime as needed for sleep.   apixaban 5 MG Tabs tablet Commonly known as:  ELIQUIS Take 1 tablet (5 mg total) by mouth 2 (two) times daily.   bimatoprost 0.03 % ophthalmic solution Commonly known as:  LUMIGAN Place 1 drop into both eyes at bedtime.   budesonide-formoterol 160-4.5 MCG/ACT inhaler Commonly known as:  SYMBICORT Inhale 2 puffs into the lungs 2 (two) times daily.   calcium carbonate 1500 (600 Ca) MG Tabs tablet Commonly known as:  OSCAL Take 600 mg of elemental calcium by mouth 2 (two) times daily with a meal.   diltiazem 120 MG 24 hr capsule Commonly known as:  CARDIZEM CD Take 1 capsule (120 mg total) by mouth 2 (two) times daily.   diphenhydrAMINE 25 MG tablet Commonly known as:  BENADRYL Take 50 mg by mouth at bedtime.   dronedarone 400 MG tablet Commonly known as:  MULTAQ Take 1 tablet (400 mg total) by mouth 2 (two) times daily with a meal.   folic acid 1 MG tablet Commonly known as:  FOLVITE Take 1 mg by mouth daily.   HYDROcodone-acetaminophen 7.5-325 MG tablet Commonly known as:  NORCO Take 1 tablet by mouth every 4 (four) hours as needed for moderate pain.   levocetirizine 5 MG tablet Commonly known as:  XYZAL Take 1 tablet (5 mg total) by mouth every evening.   levothyroxine 75 MCG tablet Commonly known as:  SYNTHROID, LEVOTHROID Take 75 mcg by mouth daily before breakfast.  methotrexate 2.5 MG tablet Commonly known as:  RHEUMATREX Take 25 mg by mouth every Wednesday. Caution:Chemotherapy. Protect from light.   montelukast 10 MG tablet Commonly known as:  SINGULAIR Take 10 mg at bedtime by mouth.   multivitamin with minerals Tabs tablet Take 1 tablet daily by mouth.   NASACORT ALLERGY 24HR 55 MCG/ACT Aero nasal inhaler Generic drug:  triamcinolone Place 2 sprays 2 (two) times daily into the nose.   OCUVITE PRESERVISION PO Take 1 tablet 2 (two) times  daily by mouth.   omeprazole 40 MG capsule Commonly known as:  PRILOSEC Take 40 mg daily by mouth.   predniSONE 5 MG tablet Commonly known as:  DELTASONE Take 2.5 mg by mouth daily with breakfast.   RHOPRESSA 0.02 % Soln Generic drug:  Netarsudil Dimesylate Place 1 drop into the right eye daily.   simvastatin 10 MG tablet Commonly known as:  ZOCOR Take 10 mg by mouth every evening.      Follow-up Information    Izora Gala, MD. Schedule an appointment as soon as possible for a visit on 01/23/2018.   Specialty:  Otolaryngology Contact information: [address] [place] [address] [phone]           Signed: Izora Gala 01/19/2018, 9:37 AM

## 2018-01-19 NOTE — Telephone Encounter (Signed)
Pt was seen at [facility] ED last night for nosebleed, nose was cauterized and packed with gauze, gauze is to remain for a week.   Because nose is packed, pt is having to mouth breathe, which he is having difficulty doing.  Pt states that he does not feel acutely short of breath, but he is having a hard time remembering to continuously mouth breathe.  This is also making his mouth dry and his throat sore.   Pt is requesting recs from our office.  I advised that because his nose is packed, unfortunately he will have to rely on mouth breathing until his nose is healed.  Pt is insistent on additional recs from our office.  BQ is not in office- sending to APP of the day.  TP please advise.  Thanks!

## 2018-01-23 DIAGNOSIS — R040 Epistaxis: Secondary | ICD-10-CM | POA: Diagnosis not present

## 2018-01-23 DIAGNOSIS — M3130 Wegener's granulomatosis without renal involvement: Secondary | ICD-10-CM | POA: Diagnosis not present

## 2018-01-23 DIAGNOSIS — J3489 Other specified disorders of nose and nasal sinuses: Secondary | ICD-10-CM | POA: Diagnosis not present

## 2018-01-31 DIAGNOSIS — M859 Disorder of bone density and structure, unspecified: Secondary | ICD-10-CM | POA: Diagnosis not present

## 2018-02-06 ENCOUNTER — Telehealth: Payer: Self-pay | Admitting: Pulmonary Disease

## 2018-02-06 MED ORDER — AZITHROMYCIN 250 MG PO TABS: ORAL_TABLET | ORAL | 0 refills | Status: DC

## 2018-02-06 NOTE — Telephone Encounter (Signed)
Zpack Take OTC mucinex Milta Deiters med rinses

## 2018-02-06 NOTE — Telephone Encounter (Signed)
Called and spoke with pt who states symptoms began 02/05/18 of congestion, fatigue, and cough with brown mucus.  Pt denies any fever or chest tightness. Pt states using his inhalers has helped but due to the change in mucus, pt is wanting something to be prescribed to help with his symptoms.  Dr. Lake Bells, please advise on this for pt.Thanks!

## 2018-02-06 NOTE — Telephone Encounter (Signed)
Called and spoke with pt letting him know the recs stated by BQ. Pt expressed understanding. I verified pt's preferred pharmacy and sent Rx in. Nothing further needed.

## 2018-02-12 DIAGNOSIS — H2511 Age-related nuclear cataract, right eye: Secondary | ICD-10-CM | POA: Diagnosis not present

## 2018-02-12 DIAGNOSIS — H353211 Exudative age-related macular degeneration, right eye, with active choroidal neovascularization: Secondary | ICD-10-CM | POA: Diagnosis not present

## 2018-02-12 DIAGNOSIS — M3130 Wegener's granulomatosis without renal involvement: Secondary | ICD-10-CM | POA: Diagnosis not present

## 2018-02-12 DIAGNOSIS — H3561 Retinal hemorrhage, right eye: Secondary | ICD-10-CM | POA: Diagnosis not present

## 2018-02-16 DIAGNOSIS — C44319 Basal cell carcinoma of skin of other parts of face: Secondary | ICD-10-CM | POA: Diagnosis not present

## 2018-02-16 DIAGNOSIS — C4441 Basal cell carcinoma of skin of scalp and neck: Secondary | ICD-10-CM | POA: Diagnosis not present

## 2018-03-01 DIAGNOSIS — Z6823 Body mass index (BMI) 23.0-23.9, adult: Secondary | ICD-10-CM | POA: Diagnosis not present

## 2018-03-01 DIAGNOSIS — N393 Stress incontinence (female) (male): Secondary | ICD-10-CM | POA: Diagnosis not present

## 2018-03-02 ENCOUNTER — Telehealth: Payer: Self-pay | Admitting: *Deleted

## 2018-03-02 DIAGNOSIS — R2681 Unsteadiness on feet: Secondary | ICD-10-CM

## 2018-03-02 DIAGNOSIS — M25571 Pain in right ankle and joints of right foot: Secondary | ICD-10-CM

## 2018-03-02 NOTE — Telephone Encounter (Signed)
Chad Morrison [facility] states pt had initial evaluation for PT, then was later hospitalized, pt would like to begin PT, [facility] requires new referral. Dr. Paulla Dolly ordered refer pt for right ankle instability. Hand delivered new referral to [facility].

## 2018-03-05 DIAGNOSIS — M25471 Effusion, right ankle: Secondary | ICD-10-CM | POA: Diagnosis not present

## 2018-03-05 DIAGNOSIS — M25674 Stiffness of right foot, not elsewhere classified: Secondary | ICD-10-CM | POA: Diagnosis not present

## 2018-03-05 DIAGNOSIS — M25474 Effusion, right foot: Secondary | ICD-10-CM | POA: Diagnosis not present

## 2018-03-05 DIAGNOSIS — M25671 Stiffness of right ankle, not elsewhere classified: Secondary | ICD-10-CM | POA: Diagnosis not present

## 2018-03-06 DIAGNOSIS — Z79899 Other long term (current) drug therapy: Secondary | ICD-10-CM | POA: Diagnosis not present

## 2018-03-06 DIAGNOSIS — M3130 Wegener's granulomatosis without renal involvement: Secondary | ICD-10-CM | POA: Diagnosis not present

## 2018-03-06 DIAGNOSIS — I776 Arteritis, unspecified: Secondary | ICD-10-CM | POA: Diagnosis not present

## 2018-03-06 DIAGNOSIS — H0510 Unspecified chronic inflammatory disorders of orbit: Secondary | ICD-10-CM | POA: Diagnosis not present

## 2018-03-09 DIAGNOSIS — M25674 Stiffness of right foot, not elsewhere classified: Secondary | ICD-10-CM | POA: Diagnosis not present

## 2018-03-09 DIAGNOSIS — M25671 Stiffness of right ankle, not elsewhere classified: Secondary | ICD-10-CM | POA: Diagnosis not present

## 2018-03-09 DIAGNOSIS — M25471 Effusion, right ankle: Secondary | ICD-10-CM | POA: Diagnosis not present

## 2018-03-09 DIAGNOSIS — M25474 Effusion, right foot: Secondary | ICD-10-CM | POA: Diagnosis not present

## 2018-03-12 DIAGNOSIS — M25471 Effusion, right ankle: Secondary | ICD-10-CM | POA: Diagnosis not present

## 2018-03-12 DIAGNOSIS — M25671 Stiffness of right ankle, not elsewhere classified: Secondary | ICD-10-CM | POA: Diagnosis not present

## 2018-03-12 DIAGNOSIS — M25474 Effusion, right foot: Secondary | ICD-10-CM | POA: Diagnosis not present

## 2018-03-12 DIAGNOSIS — M25674 Stiffness of right foot, not elsewhere classified: Secondary | ICD-10-CM | POA: Diagnosis not present

## 2018-03-14 ENCOUNTER — Ambulatory Visit (INDEPENDENT_AMBULATORY_CARE_PROVIDER_SITE_OTHER): Payer: Medicare Other | Admitting: Internal Medicine

## 2018-03-14 ENCOUNTER — Encounter: Payer: Self-pay | Admitting: Internal Medicine

## 2018-03-14 VITALS — BP 122/70 | HR 65 | Ht 69.0 in | Wt 162.4 lb

## 2018-03-14 DIAGNOSIS — I4819 Other persistent atrial fibrillation: Secondary | ICD-10-CM | POA: Diagnosis not present

## 2018-03-14 DIAGNOSIS — R0602 Shortness of breath: Secondary | ICD-10-CM | POA: Diagnosis not present

## 2018-03-14 DIAGNOSIS — I483 Typical atrial flutter: Secondary | ICD-10-CM

## 2018-03-14 NOTE — Progress Notes (Signed)
PCP: Haywood Pao, MD   Primary EP: Dr [name]  [place] Chad Morrison is a 85 y.o. male who presents today for routine electrophysiology followup.  Since last being seen in our clinic, the patient reports doing reasonably well.  His strength continues to improve.  His Wegeners vasculitis is stable.  He did have a nose bleed in November which has not recurred since adding nasal saline.  Today, he denies symptoms of palpitations, chest pain, shortness of breath,  lower extremity edema, dizziness, presyncope, or syncope.  The patient is otherwise without complaint today.   Past Medical History:  Diagnosis Date  . Anxiety   . Asthma   . Asthmatic bronchitis   . Atrial flutter (HCC)    chads2vasc score is 3  . Ehrlichiosis 5176  . GERD (gastroesophageal reflux disease)   . 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 HTN (hypertension)   . Hyperlipidemia   . Paroxysmal atrial fibrillation (HCC)   . Prostate cancer ([place])   . Seasonal allergies    Past Surgical History:  Procedure Laterality Date  . BASAL CELL CARCINOMA EXCISION  2013   EYELID  . HERNIA REPAIR Bilateral 2000, 2002  . KNEE ARTHROSCOPY  2000  . prostectomy  1995   RADICAL  . TONSILLECTOMY  1940    ROS- all systems are reviewed and negatives except as per HPI above  Current Outpatient Medications  Medication Sig Dispense Refill  . albuterol (PROVENTIL HFA;VENTOLIN HFA) 108 (90 Base) MCG/ACT inhaler Inhale 1-2 puffs into the lungs every 6 (six) hours as needed for wheezing or shortness of breath. 3 Inhaler 3  . ALPRAZolam (XANAX) 0.25 MG tablet Take 0.125 mg by mouth at bedtime as needed for sleep.     Marland Kitchen apixaban (ELIQUIS) 5 MG TABS tablet Take 1 tablet (5 mg total) by mouth 2 (two) times daily. 180 tablet 3  . bimatoprost (LUMIGAN) 0.03 % ophthalmic solution Place 1 drop into both eyes at bedtime.     . budesonide-formoterol (SYMBICORT) 160-4.5 MCG/ACT inhaler Inhale 2 puffs into the lungs 2 (two) times daily. 1 Inhaler 5   . calcium carbonate (OSCAL) 1500 (600 Ca) MG TABS tablet Take 600 mg of elemental calcium by mouth 2 (two) times daily with a meal.    . diltiazem (CARDIZEM CD) 120 MG 24 hr capsule Take 1 capsule (120 mg total) by mouth 2 (two) times daily. 60 capsule 6  . diphenhydrAMINE (BENADRYL) 25 MG tablet Take 50 mg by mouth at bedtime.     . dronedarone (MULTAQ) 400 MG tablet Take 1 tablet (400 mg total) by mouth 2 (two) times daily with a meal. 60 tablet 11  . folic acid (FOLVITE) 1 MG tablet Take 1 mg by mouth daily.    Marland Kitchen levocetirizine (XYZAL) 5 MG tablet Take 1 tablet (5 mg total) by mouth every evening. 93 tablet 3  . levothyroxine (SYNTHROID, LEVOTHROID) 75 MCG tablet Take 75 mcg by mouth daily before breakfast.    . methotrexate (RHEUMATREX) 2.5 MG tablet Take 25 mg by mouth every Wednesday. Caution:Chemotherapy. Protect from light.     . montelukast (SINGULAIR) 10 MG tablet Take 10 mg at bedtime by mouth.    . Multiple Vitamin (MULTIVITAMIN WITH MINERALS) TABS tablet Take 1 tablet daily by mouth.    . Multiple Vitamins-Minerals (OCUVITE PRESERVISION PO) Take 1 tablet 2 (two) times daily by mouth.    Mckinley Jewel Dimesylate (RHOPRESSA) 0.02 % SOLN Place 1 drop into the right eye daily.    Marland Kitchen  omeprazole (PRILOSEC) 40 MG capsule Take 40 mg daily by mouth.    . predniSONE (DELTASONE) 5 MG tablet Take 2.5 mg by mouth daily with breakfast. Use as directed.    . simvastatin (ZOCOR) 10 MG tablet Take 10 mg by mouth every evening.     . triamcinolone (NASACORT ALLERGY 24HR) 55 MCG/ACT AERO nasal inhaler Place 2 sprays into the nose 2 (two) times daily.      No current facility-administered medications for this visit.     Physical Exam: Vitals:   03/14/18 1121  BP: 122/70  Pulse: 65  SpO2: 95%  Weight: 162 lb 6.4 oz (73.7 kg)  Height: 5\' 9"  (1.753 m)    GEN- The patient is well appearing, alert and oriented x 3 today.   Head- normocephalic, atraumatic Eyes-  Sclera clear, conjunctiva  pink Ears- hearing intact Oropharynx- clear Lungs- Clear to ausculation bilaterally, normal work of breathing Heart- Regular rate and rhythm, 2/6 SEM LUSB which is mid peaking GI- soft, NT, ND, + BS Extremities- no clubbing, cyanosis, or edema  Wt Readings from Last 3 Encounters:  03/14/18 162 lb 6.4 oz (73.7 kg)  01/18/18 165 lb (74.8 kg)  01/15/18 166 lb (75.3 kg)   Echo 8/19 is reviewed  EKG tracing ordered today is personally reviewed and shows sinus rhythm 65 bpm, PR 176 msec, RBBB, LAD, Qtc 478 msec  Assessment and Plan:  1. Persistent afib/ typical atrial flutter Doing very well with multaq No changes  2. SOB Followed by pulmonary Has been diagnosed with granulomatous vasculitis  3. Moderate aortic stenosis Repeat echo in August  Follow-up in the AF clinic every 4-6 months I will see when needed  Thompson Grayer MD, [facility] 03/14/2018 11:33 AM

## 2018-03-14 NOTE — Patient Instructions (Addendum)
Medication Instructions:  Your physician recommends that you continue on your current medications as directed. Please refer to the Current Medication list given to you today.  Labwork: None ordered.  Testing/Procedures: None ordered.  Follow-Up: Your physician wants you to follow-up in: 4 months with Roderic Palau  NP at the AFIB clinic.      Any Other Special Instructions Will Be Listed Below (If Applicable).  If you need a refill on your cardiac medications before your next appointment, please call your pharmacy.

## 2018-03-16 ENCOUNTER — Encounter: Payer: Self-pay | Admitting: Pulmonary Disease

## 2018-03-16 ENCOUNTER — Ambulatory Visit (INDEPENDENT_AMBULATORY_CARE_PROVIDER_SITE_OTHER): Payer: Medicare Other | Admitting: Pulmonary Disease

## 2018-03-16 VITALS — BP 142/64 | HR 61 | Ht 69.0 in | Wt 163.0 lb

## 2018-03-16 DIAGNOSIS — J449 Chronic obstructive pulmonary disease, unspecified: Secondary | ICD-10-CM | POA: Diagnosis not present

## 2018-03-16 DIAGNOSIS — J309 Allergic rhinitis, unspecified: Secondary | ICD-10-CM

## 2018-03-16 DIAGNOSIS — J4540 Moderate persistent asthma, uncomplicated: Secondary | ICD-10-CM | POA: Diagnosis not present

## 2018-03-16 DIAGNOSIS — J849 Interstitial pulmonary disease, unspecified: Secondary | ICD-10-CM

## 2018-03-16 NOTE — Patient Instructions (Signed)
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 Alejandro Mulling vessel vasculitis with some lung nodules: We will repeat a CT scan of your chest Continue methotrexate Continue follow-up with the [place] rheumatology clinic If Rituxan is ever necessary we can help administer that here in [facility]  Severe persistent asthma: Continue Symbicort Continue albuterol Practice good hand hygiene Stay active  We will see you back in March or sooner if needed

## 2018-03-16 NOTE — Progress Notes (Signed)
Subjective:    Patient ID: Chad Morrison, male    DOB: [birth year], 85 y.o.   MRN: [id number]  Synopsis: Referred in 2018 for evaluation of asthma.  He was diagnosed with ANCA associated vasculitis in 2019.  He started on methotrexate and prednisone for small vessel vasculitis in 2019.  He also was diagnosed with A. fib and was treated with amiodarone for about 1 year by the cardiology clinic.   HPI Chief Complaint  Patient presents with  . Follow-up    Breathing is doing well. He states he has some nasal and chest congestion but this is no worse than usual for him. He uses his albuterol inahaler 2 puffs before bedtime.    Chad Morrison has been doing well since the last visit.  He says that his sinus symptoms have improved but he still has some allergy symptoms from time to time.  He is not had any trouble breathing is not coughing up anything.  No hemoptysis.  He continues to take methotrexate regularly.  Past Medical History:  Diagnosis Date  . Anxiety   . Asthma   . Asthmatic bronchitis   . Atrial flutter (HCC)    chads2vasc score is 3  . Ehrlichiosis 9935  . GERD (gastroesophageal reflux disease)   . 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 HTN (hypertension)   . Hyperlipidemia   . Paroxysmal atrial fibrillation (HCC)   . Prostate cancer ([place])   . Seasonal allergies          Review of Systems  Constitutional: Negative for fever and unexpected weight change.  HENT: Negative for congestion, dental problem, ear pain, nosebleeds, postnasal drip, rhinorrhea, sinus pressure, sneezing, sore throat and trouble swallowing.   Eyes: Negative for redness and itching.  Respiratory: Negative for cough, chest tightness, shortness of breath and wheezing.   Cardiovascular: Negative for palpitations and leg swelling.  Gastrointestinal: Negative for nausea and vomiting.  Genitourinary: Negative for dysuria.  Musculoskeletal: Negative for joint swelling.  Skin: Negative for rash.    Neurological: Negative for headaches.  Hematological: Does not bruise/bleed easily.  Psychiatric/Behavioral: Negative for dysphoric mood. The patient is not nervous/anxious.        Objective:   Physical Exam  Vitals:   03/16/18 1119  BP: (!) 142/64  Pulse: 61  SpO2: 99%  Weight: 163 lb (73.9 kg)  Height: 5\' 9"  (1.753 m)    Gen: well appearing HENT: OP clear, TM's clear, neck supple PULM: CTA B, normal percussion CV: RRR, no mgr, trace edema GI: BS+, soft, nontender Derm: no cyanosis or rash Psyche: normal mood and affect   Pulmonary function testing: March 2019 ratio 65%, FEV1 2.44 L 88% predicted, FVC 3.78 L 96% predicted, total lung capacity 7.41 L 104% predicted, residual volume 152% predict, DLCO 22.8 70% predicted  Chest imaging: October 2019 CT chest images independently reviewed showing normal pulmonary parenchyma in the upper lobes but there are nodules scattered throughout, largest 6 mm in size, there is also basilar predominant reticulation and nonspecific groundglass which appears to be central in distribution.  Overall findings are mild.  CBC    Component Value Date/Time   WBC 5.5 01/19/2018 0552   RBC 3.42 (L) 01/19/2018 0552   HGB 8.3 (L) 01/19/2018 0552   HCT 26.6 (L) 01/19/2018 0552   PLT 220 01/19/2018 0552   MCV 77.8 (L) 01/19/2018 0552   MCH 24.3 (L) 01/19/2018 0552   MCHC 31.2 01/19/2018 0552   RDW 19.9 (H) 01/19/2018 7017  LYMPHSABS 0.4 (L) 01/18/2018 2142   MONOABS 0.6 01/18/2018 2142   EOSABS 0.1 01/18/2018 2142   BASOSABS 0.0 01/18/2018 2142    06/2017 CXR > flattening of diaphragms, no infiltrate  Records from his 07/2017 visit with [facility] rheum reviewed where his ANCA vasculitis was treated with MTX and prednisone was tapered     Assessment & Plan:   Allergic rhinitis, unspecified seasonality, unspecified trigger  Interstitial pulmonary disease ([place]) - Plan: CT Chest Wo Contrast  Chronic obstructive pulmonary disease, unspecified  COPD type ([place])  Moderate persistent chronic asthma without complication  Discussion: This has been a stable interval for Chad Morrison.  He has not had an exacerbation of his underlying small vessel vasculitis since the last visit.  His allergic rhinitis symptoms are fairly well controlled right now and any impact from the small vessel vasculitis on his sinuses seems to be mitigated by his methotrexate and the recent addition of saline sprays.  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 Alejandro Morrison vessel vasculitis with some lung nodules: We will repeat a CT scan of your chest Continue methotrexate Continue follow-up with the [place] rheumatology clinic If Rituxan is ever necessary we can help administer that here in [facility]  Severe persistent asthma: Continue Symbicort Continue albuterol Practice good hand hygiene Stay active  We will see you back in March or sooner if needed  > 50% of this 25 min visit spent face to face   Current Outpatient Medications:  .  albuterol (PROVENTIL HFA;VENTOLIN HFA) 108 (90 Base) MCG/ACT inhaler, Inhale 1-2 puffs into the lungs every 6 (six) hours as needed for wheezing or shortness of breath., Disp: 3 Inhaler, Rfl: 3 .  ALPRAZolam (XANAX) 0.25 MG tablet, Take 0.125 mg by mouth at bedtime as needed for sleep. , Disp: , Rfl:  .  apixaban (ELIQUIS) 5 MG TABS tablet, Take 1 tablet (5 mg total) by mouth 2 (two) times daily., Disp: 180 tablet, Rfl: 3 .  bimatoprost (LUMIGAN) 0.03 % ophthalmic solution, Place 1 drop into both eyes at bedtime. , Disp: , Rfl:  .  budesonide-formoterol (SYMBICORT) 160-4.5 MCG/ACT inhaler, Inhale 2 puffs into the lungs 2 (two) times daily., Disp: 1 Inhaler, Rfl: 5 .  calcium carbonate (OSCAL) 1500 (600 Ca) MG TABS tablet, Take 600 mg of  elemental calcium by mouth 2 (two) times daily with a meal., Disp: , Rfl:  .  diltiazem (CARDIZEM CD) 120 MG 24 hr capsule, Take 1 capsule (120 mg total) by mouth 2 (two) times daily., Disp: 60 capsule, Rfl: 6 .  diphenhydrAMINE (BENADRYL) 25 MG tablet, Take 50 mg by mouth at bedtime. , Disp: , Rfl:  .  dronedarone (MULTAQ) 400 MG tablet, Take 1 tablet (400 mg total) by mouth 2 (two) times daily with a meal., Disp: 60 tablet, Rfl: 11 .  folic acid (FOLVITE) 1 MG tablet, Take 1 mg by mouth daily., Disp: , Rfl:  .  levocetirizine (XYZAL) 5 MG tablet, Take 1 tablet (5 mg total) by mouth every evening., Disp: 93 tablet, Rfl: 3 .  levothyroxine (SYNTHROID, LEVOTHROID) 75 MCG tablet, Take 75 mcg by mouth daily before breakfast., Disp: , Rfl:  .  methotrexate (RHEUMATREX) 2.5 MG tablet, Take 25 mg by mouth every Wednesday. Caution:Chemotherapy. Protect from light. , Disp: , Rfl:  .  montelukast (SINGULAIR) 10 MG tablet, Take 10 mg at bedtime by mouth., Disp: , Rfl:  .  Multiple Vitamin (MULTIVITAMIN WITH MINERALS) TABS tablet, Take 1 tablet daily by mouth., Disp: , Rfl:  .  Multiple Vitamins-Minerals (OCUVITE PRESERVISION PO), Take 1 tablet 2 (two) times daily by mouth., Disp: , Rfl:  .  Netarsudil Dimesylate (RHOPRESSA) 0.02 % SOLN, Place 1 drop into the right eye daily., Disp: , Rfl:  .  omeprazole (PRILOSEC) 40 MG capsule, Take 40 mg daily by mouth., Disp: , Rfl:  .  predniSONE (DELTASONE) 5 MG tablet, 2.5 mg alternating with 5 mg, Disp: , Rfl:  .  simvastatin (ZOCOR) 10 MG tablet, Take 10 mg by mouth every evening. , Disp: , Rfl:  .  triamcinolone (NASACORT ALLERGY 24HR) 55 MCG/ACT AERO nasal inhaler, Place 2 sprays into the nose 2 (two) times daily. , Disp: , Rfl:

## 2018-03-19 DIAGNOSIS — H353211 Exudative age-related macular degeneration, right eye, with active choroidal neovascularization: Secondary | ICD-10-CM | POA: Diagnosis not present

## 2018-03-19 DIAGNOSIS — H3561 Retinal hemorrhage, right eye: Secondary | ICD-10-CM | POA: Diagnosis not present

## 2018-03-20 DIAGNOSIS — H4051X0 Glaucoma secondary to other eye disorders, right eye, stage unspecified: Secondary | ICD-10-CM | POA: Diagnosis not present

## 2018-03-20 DIAGNOSIS — H268 Other specified cataract: Secondary | ICD-10-CM | POA: Diagnosis not present

## 2018-03-20 DIAGNOSIS — H25811 Combined forms of age-related cataract, right eye: Secondary | ICD-10-CM | POA: Diagnosis not present

## 2018-03-20 DIAGNOSIS — H3530 Unspecified macular degeneration: Secondary | ICD-10-CM | POA: Diagnosis not present

## 2018-03-27 ENCOUNTER — Telehealth: Payer: Self-pay | Admitting: Pulmonary Disease

## 2018-03-27 NOTE — Telephone Encounter (Signed)
Spoke with Lasana for office; states CT Chest for March will be scheduled in Feb 2020; PCC's will contact patient then.   [name]-wife to patient is aware. Nothing more needed at this time.

## 2018-03-27 NOTE — Telephone Encounter (Signed)
LMTCB x1 for pt.  

## 2018-03-27 NOTE — Telephone Encounter (Signed)
Patient's wife Chad Morrison returning phone call.  Patient phone number is [phone].

## 2018-03-29 DIAGNOSIS — H59031 Cystoid macular edema following cataract surgery, right eye: Secondary | ICD-10-CM | POA: Diagnosis not present

## 2018-03-29 DIAGNOSIS — M25471 Effusion, right ankle: Secondary | ICD-10-CM | POA: Diagnosis not present

## 2018-03-29 DIAGNOSIS — H35371 Puckering of macula, right eye: Secondary | ICD-10-CM | POA: Diagnosis not present

## 2018-03-29 DIAGNOSIS — M25671 Stiffness of right ankle, not elsewhere classified: Secondary | ICD-10-CM | POA: Diagnosis not present

## 2018-03-29 DIAGNOSIS — M25674 Stiffness of right foot, not elsewhere classified: Secondary | ICD-10-CM | POA: Diagnosis not present

## 2018-03-29 DIAGNOSIS — M25474 Effusion, right foot: Secondary | ICD-10-CM | POA: Diagnosis not present

## 2018-04-12 DIAGNOSIS — M25474 Effusion, right foot: Secondary | ICD-10-CM | POA: Diagnosis not present

## 2018-04-12 DIAGNOSIS — M25471 Effusion, right ankle: Secondary | ICD-10-CM | POA: Diagnosis not present

## 2018-04-12 DIAGNOSIS — M25674 Stiffness of right foot, not elsewhere classified: Secondary | ICD-10-CM | POA: Diagnosis not present

## 2018-04-12 DIAGNOSIS — M25671 Stiffness of right ankle, not elsewhere classified: Secondary | ICD-10-CM | POA: Diagnosis not present

## 2018-04-18 DIAGNOSIS — L905 Scar conditions and fibrosis of skin: Secondary | ICD-10-CM | POA: Diagnosis not present

## 2018-04-18 DIAGNOSIS — C44319 Basal cell carcinoma of skin of other parts of face: Secondary | ICD-10-CM | POA: Diagnosis not present

## 2018-04-18 DIAGNOSIS — L280 Lichen simplex chronicus: Secondary | ICD-10-CM | POA: Diagnosis not present

## 2018-04-18 DIAGNOSIS — L570 Actinic keratosis: Secondary | ICD-10-CM | POA: Diagnosis not present

## 2018-04-18 DIAGNOSIS — D485 Neoplasm of uncertain behavior of skin: Secondary | ICD-10-CM | POA: Diagnosis not present

## 2018-04-20 DIAGNOSIS — M25474 Effusion, right foot: Secondary | ICD-10-CM | POA: Diagnosis not present

## 2018-04-20 DIAGNOSIS — M25671 Stiffness of right ankle, not elsewhere classified: Secondary | ICD-10-CM | POA: Diagnosis not present

## 2018-04-20 DIAGNOSIS — M25674 Stiffness of right foot, not elsewhere classified: Secondary | ICD-10-CM | POA: Diagnosis not present

## 2018-04-20 DIAGNOSIS — M25471 Effusion, right ankle: Secondary | ICD-10-CM | POA: Diagnosis not present

## 2018-04-23 DIAGNOSIS — H3561 Retinal hemorrhage, right eye: Secondary | ICD-10-CM | POA: Diagnosis not present

## 2018-04-23 DIAGNOSIS — H59031 Cystoid macular edema following cataract surgery, right eye: Secondary | ICD-10-CM | POA: Diagnosis not present

## 2018-04-23 DIAGNOSIS — H353211 Exudative age-related macular degeneration, right eye, with active choroidal neovascularization: Secondary | ICD-10-CM | POA: Diagnosis not present

## 2018-04-25 DIAGNOSIS — L570 Actinic keratosis: Secondary | ICD-10-CM | POA: Diagnosis not present

## 2018-04-27 DIAGNOSIS — M25471 Effusion, right ankle: Secondary | ICD-10-CM | POA: Diagnosis not present

## 2018-04-27 DIAGNOSIS — M25474 Effusion, right foot: Secondary | ICD-10-CM | POA: Diagnosis not present

## 2018-04-27 DIAGNOSIS — M25674 Stiffness of right foot, not elsewhere classified: Secondary | ICD-10-CM | POA: Diagnosis not present

## 2018-04-27 DIAGNOSIS — M25671 Stiffness of right ankle, not elsewhere classified: Secondary | ICD-10-CM | POA: Diagnosis not present

## 2018-05-02 DIAGNOSIS — J3489 Other specified disorders of nose and nasal sinuses: Secondary | ICD-10-CM | POA: Diagnosis not present

## 2018-05-02 DIAGNOSIS — M3130 Wegener's granulomatosis without renal involvement: Secondary | ICD-10-CM | POA: Diagnosis not present

## 2018-05-02 DIAGNOSIS — J324 Chronic pansinusitis: Secondary | ICD-10-CM | POA: Diagnosis not present

## 2018-05-04 ENCOUNTER — Ambulatory Visit (INDEPENDENT_AMBULATORY_CARE_PROVIDER_SITE_OTHER)
Admission: RE | Admit: 2018-05-04 | Discharge: 2018-05-04 | Disposition: A | Discharge status: Home / Self Care | Payer: Medicare Other | Source: Ambulatory Visit | Attending: Pulmonary Disease | Admitting: Pulmonary Disease

## 2018-05-04 DIAGNOSIS — J849 Interstitial pulmonary disease, unspecified: Secondary | ICD-10-CM | POA: Diagnosis not present

## 2018-05-04 DIAGNOSIS — R918 Other nonspecific abnormal finding of lung field: Secondary | ICD-10-CM | POA: Diagnosis not present

## 2018-05-07 DIAGNOSIS — M25471 Effusion, right ankle: Secondary | ICD-10-CM | POA: Diagnosis not present

## 2018-05-07 DIAGNOSIS — M25671 Stiffness of right ankle, not elsewhere classified: Secondary | ICD-10-CM | POA: Diagnosis not present

## 2018-05-07 DIAGNOSIS — M25674 Stiffness of right foot, not elsewhere classified: Secondary | ICD-10-CM | POA: Diagnosis not present

## 2018-05-07 DIAGNOSIS — M25474 Effusion, right foot: Secondary | ICD-10-CM | POA: Diagnosis not present

## 2018-05-08 ENCOUNTER — Telehealth: Payer: Self-pay | Admitting: Pulmonary Disease

## 2018-05-08 NOTE — Telephone Encounter (Signed)
Notes recorded by Valerie Salts, CMA on 05/07/2018 at 4:31 PM EDT Left message for patient to call back for results. ------  Notes recorded by Juanito Doom, MD on 05/07/2018 at 8:47 AM EDT A, Please let the patient know this showed the nodules were stable Thanks, B  Called and spoke with pt letting him know the results of CT. Pt expressed understanding. Pt did not have a f/u scheduled with BQ so I did make an appt for pt with BQ tomorrow, 05/09/2018 at 10:45. Nothing further needed.

## 2018-05-08 NOTE — Telephone Encounter (Signed)
Pt is calling back [phone]

## 2018-05-08 NOTE — Telephone Encounter (Signed)
Called patient, unable to reach LMTCB 

## 2018-05-09 ENCOUNTER — Other Ambulatory Visit: Payer: Self-pay

## 2018-05-09 ENCOUNTER — Encounter: Payer: Self-pay | Admitting: Pulmonary Disease

## 2018-05-09 ENCOUNTER — Ambulatory Visit (INDEPENDENT_AMBULATORY_CARE_PROVIDER_SITE_OTHER): Payer: Medicare Other | Admitting: Pulmonary Disease

## 2018-05-09 VITALS — BP 134/60 | HR 64 | Ht 66.0 in | Wt 162.2 lb

## 2018-05-09 DIAGNOSIS — J309 Allergic rhinitis, unspecified: Secondary | ICD-10-CM | POA: Diagnosis not present

## 2018-05-09 DIAGNOSIS — J849 Interstitial pulmonary disease, unspecified: Secondary | ICD-10-CM

## 2018-05-09 DIAGNOSIS — J4540 Moderate persistent asthma, uncomplicated: Secondary | ICD-10-CM | POA: Diagnosis not present

## 2018-05-09 DIAGNOSIS — M3130 Wegener's granulomatosis without renal involvement: Secondary | ICD-10-CM

## 2018-05-09 NOTE — Progress Notes (Signed)
Subjective:    Patient ID: Chad Morrison, male    DOB: [birth year], 85 y.o.   MRN: [id number]  Synopsis: Referred in 2018 for evaluation of asthma.  He was diagnosed with ANCA associated vasculitis in 2019.  He started on methotrexate and prednisone for small vessel vasculitis in 2019.  He also was diagnosed with A. fib and was treated with amiodarone for about 1 year by the cardiology clinic.   HPI Chief Complaint  Patient presents with  . Follow-up   Chad Morrison is here to see [place] for a follow-up.  He says he is doing quite well.  His sinus symptoms have improved significantly.  He is weaning off of prednisone per direction from his rheumatology doctors.  He continues to take methotrexate.  No problems with breathing recently, he does have some leg weakness when he exercises but he is intentionally exercising regularly.  Past Medical History:  Diagnosis Date  . Anxiety   . Asthma   . Asthmatic bronchitis   . Atrial flutter (HCC)    chads2vasc score is 3  . Ehrlichiosis 1448  . GERD (gastroesophageal reflux disease)   . 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 HTN (hypertension)   . Hyperlipidemia   . Paroxysmal atrial fibrillation (HCC)   . Prostate cancer ([place])   . Seasonal allergies          Review of Systems  Constitutional: Negative for fever and unexpected weight change.  HENT: Negative for congestion, dental problem, ear pain, nosebleeds, postnasal drip, rhinorrhea, sinus pressure, sneezing, sore throat and trouble swallowing.   Eyes: Negative for redness and itching.  Respiratory: Negative for cough, chest tightness, shortness of breath and wheezing.   Cardiovascular: Negative for palpitations and leg swelling.  Gastrointestinal: Negative for nausea and vomiting.  Genitourinary: Negative for dysuria.  Musculoskeletal: Negative for joint swelling.  Skin: Negative for rash.  Neurological: Negative for headaches.  Hematological: Does not bruise/bleed easily.   Psychiatric/Behavioral: Negative for dysphoric mood. The patient is not nervous/anxious.        Objective:   Physical Exam  Vitals:   05/09/18 1042  BP: 134/60  Pulse: 64  SpO2: 97%  Weight: 162 lb 3.2 oz (73.6 kg)  Height: 5\' 6"  (1.676 m)    Gen: well appearing HENT: OP clear, TM's clear, neck supple PULM: CTA B, normal percussion CV: RRR, loud systolic murmur, trace edema GI: BS+, soft, nontender Derm: no cyanosis or rash Psyche: normal mood and affect    Pulmonary function testing: March 2019 ratio 65%, FEV1 2.44 L 88% predicted, FVC 3.78 L 96% predicted, total lung capacity 7.41 L 104% predicted, residual volume 152% predict, DLCO 22.8 70% predicted  Chest imaging: October 2019 CT chest images independently reviewed showing normal pulmonary parenchyma in the upper lobes but there are nodules scattered throughout, largest 6 mm in size, there is also basilar predominant reticulation and nonspecific groundglass which appears to be central in distribution.  Overall findings are mild. 04/2018 CT chest > scattered pulmonary nodules are stable  CBC    Component Value Date/Time   WBC 5.5 01/19/2018 0552   RBC 3.42 (L) 01/19/2018 0552   HGB 8.3 (L) 01/19/2018 0552   HCT 26.6 (L) 01/19/2018 0552   PLT 220 01/19/2018 0552   MCV 77.8 (L) 01/19/2018 0552   MCH 24.3 (L) 01/19/2018 0552   MCHC 31.2 01/19/2018 0552   RDW 19.9 (H) 01/19/2018 0552   LYMPHSABS 0.4 (L) 01/18/2018 2142   MONOABS 0.6 01/18/2018  2142   EOSABS 0.1 01/18/2018 2142   BASOSABS 0.0 01/18/2018 2142    06/2017 CXR > flattening of diaphragms, no infiltrate  Records from his 07/2017 visit with [facility] rheum reviewed where his ANCA vasculitis was treated with MTX and prednisone was tapered     Assessment & Plan:   Interstitial pulmonary disease ([place])  Allergic rhinitis, unspecified seasonality, unspecified trigger  Moderate persistent chronic asthma without complication  Granulomatosis with  polyangiitis, unspecified whether renal involvement ([place])  Discussion: This has been a stable interval for Chad Morrison, he actually looks quite good right now, the methotrexate and prednisone have done a nice job controlling his small vessel vasculitis.  His lung nodules are stable and nothing to be worried about.  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 Alejandro Morrison vessel vasculitis with lung nodules: I am glad that the nodules had not changed on the CT scan of your chest Continue methotrexate Continue weaning off of prednisone as directed by [facility] rheumatology  Severe persistent asthma: Continue Symbicort Continue albuterol as needed Practice good hand hygiene Stay physically active  We will see you back in 6 months or sooner if needed   Current Outpatient Medications:  .  albuterol (PROVENTIL HFA;VENTOLIN HFA) 108 (90 Base) MCG/ACT inhaler, Inhale 1-2 puffs into the lungs every 6 (six) hours as needed for wheezing or shortness of breath., Disp: 3 Inhaler, Rfl: 3 .  ALPRAZolam (XANAX) 0.25 MG tablet, Take 0.125 mg by mouth at bedtime as needed for sleep. , Disp: , Rfl:  .  apixaban (ELIQUIS) 5 MG TABS tablet, Take 1 tablet (5 mg total) by mouth 2 (two) times daily., Disp: 180 tablet, Rfl: 3 .  bimatoprost (LUMIGAN) 0.03 % ophthalmic solution, Place 1 drop into both eyes at bedtime. , Disp: , Rfl:  .  budesonide-formoterol (SYMBICORT) 160-4.5 MCG/ACT inhaler, Inhale 2 puffs into the lungs 2 (two) times daily., Disp: 1 Inhaler, Rfl: 5 .  calcium carbonate (OSCAL) 1500 (600 Ca) MG TABS tablet, Take 600 mg of elemental calcium by mouth 2 (two) times daily with a meal., Disp: , Rfl:  .  diltiazem (CARDIZEM CD) 120 MG 24 hr capsule, Take 1 capsule (120 mg total) by mouth 2 (two) times daily., Disp: 60 capsule, Rfl: 6 .  diphenhydrAMINE (BENADRYL)  25 MG tablet, Take 50 mg by mouth at bedtime. , Disp: , Rfl:  .  dronedarone (MULTAQ) 400 MG tablet, Take 1 tablet (400 mg total) by mouth 2 (two) times daily with a meal., Disp: 60 tablet, Rfl: 11 .  folic acid (FOLVITE) 1 MG tablet, Take 1 mg by mouth daily., Disp: , Rfl:  .  levocetirizine (XYZAL) 5 MG tablet, Take 1 tablet (5 mg total) by mouth every evening., Disp: 93 tablet, Rfl: 3 .  levothyroxine (SYNTHROID, LEVOTHROID) 75 MCG tablet, Take 75 mcg by mouth daily before breakfast., Disp: , Rfl:  .  methotrexate (RHEUMATREX) 2.5 MG tablet, Take 25 mg by mouth every Wednesday. Caution:Chemotherapy. Protect from light. , Disp: , Rfl:  .  montelukast (SINGULAIR) 10 MG tablet, Take 10 mg at bedtime by mouth., Disp: , Rfl:  .  Multiple Vitamin (MULTIVITAMIN WITH MINERALS) TABS tablet, Take 1 tablet daily by mouth., Disp: , Rfl:  .  Multiple Vitamins-Minerals (OCUVITE PRESERVISION PO), Take 1 tablet 2 (two) times daily by mouth., Disp: , Rfl:  .  Netarsudil Dimesylate (  RHOPRESSA) 0.02 % SOLN, Place 1 drop into the right eye daily., Disp: , Rfl:  .  omeprazole (PRILOSEC) 40 MG capsule, Take 40 mg daily by mouth., Disp: , Rfl:  .  predniSONE (DELTASONE) 5 MG tablet, 2.5 mg alternating every other day, Disp: , Rfl:  .  simvastatin (ZOCOR) 10 MG tablet, Take 10 mg by mouth every evening. , Disp: , Rfl:  .  triamcinolone (NASACORT ALLERGY 24HR) 55 MCG/ACT AERO nasal inhaler, Place 2 sprays into the nose 2 (two) times daily. , Disp: , Rfl:

## 2018-05-09 NOTE — Patient Instructions (Signed)
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 Alejandro Mulling vessel vasculitis with lung nodules: I am glad that the nodules had not changed on the CT scan of your chest Continue methotrexate Continue weaning off of prednisone as directed by [facility] rheumatology  Severe persistent asthma: Continue Symbicort Continue albuterol as needed Practice good hand hygiene Stay physically active  We will see you back in 6 months or sooner if needed

## 2018-05-13 ENCOUNTER — Other Ambulatory Visit: Payer: Self-pay | Admitting: Pulmonary Disease

## 2018-05-21 ENCOUNTER — Telehealth: Payer: Self-pay | Admitting: Pulmonary Disease

## 2018-05-21 MED ORDER — BUDESONIDE-FORMOTEROL FUMARATE 160-4.5 MCG/ACT IN AERO: 2.0000 | INHALATION_SPRAY | Freq: Two times a day (BID) | RESPIRATORY_TRACT | 3 refills | Status: DC

## 2018-05-21 NOTE — Telephone Encounter (Signed)
3 month supply of symbicort with 3 refills has been sent to pt's mail order pharmacy. Called and spoke with pt's wife letting her know this had been done and she verbalized expressed understanding. Nothing further needed.

## 2018-05-21 NOTE — Telephone Encounter (Signed)
Yes 160/4.5 dose 2 puff bid, disp 90 days worth, refill 3

## 2018-05-21 NOTE — Telephone Encounter (Signed)
Instructions      Return in about 6 months (around 11/09/2018).  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 Alejandro Mulling vessel vasculitis with lung nodules: I am glad that the nodules had not changed on the CT scan of your chest Continue methotrexate Continue weaning off of prednisone as directed by [facility]ology  Severe persistent asthma: Continue Symbicort Continue albuterol as needed Practice good hand hygiene Stay physically active  We will see you back in 6 months or sooner if needed      ____________________________________________  Spoke with patients wife, patient takes symbicort and he has run out of his inhalers. This was [place] a physician [place]. They are wanting a 3 month supply sent to the mail order.   BQ please advise, thank you.

## 2018-05-23 DIAGNOSIS — H59031 Cystoid macular edema following cataract surgery, right eye: Secondary | ICD-10-CM | POA: Diagnosis not present

## 2018-05-23 DIAGNOSIS — H353211 Exudative age-related macular degeneration, right eye, with active choroidal neovascularization: Secondary | ICD-10-CM | POA: Diagnosis not present

## 2018-05-23 DIAGNOSIS — H3561 Retinal hemorrhage, right eye: Secondary | ICD-10-CM | POA: Diagnosis not present

## 2018-06-20 ENCOUNTER — Other Ambulatory Visit (HOSPITAL_COMMUNITY): Payer: Self-pay | Admitting: Nurse Practitioner

## 2018-06-27 DIAGNOSIS — Z1331 Encounter for screening for depression: Secondary | ICD-10-CM | POA: Diagnosis not present

## 2018-06-28 DIAGNOSIS — H401111 Primary open-angle glaucoma, right eye, mild stage: Secondary | ICD-10-CM | POA: Diagnosis not present

## 2018-06-28 DIAGNOSIS — H353211 Exudative age-related macular degeneration, right eye, with active choroidal neovascularization: Secondary | ICD-10-CM | POA: Diagnosis not present

## 2018-06-28 DIAGNOSIS — H59031 Cystoid macular edema following cataract surgery, right eye: Secondary | ICD-10-CM | POA: Diagnosis not present

## 2018-06-28 DIAGNOSIS — H3561 Retinal hemorrhage, right eye: Secondary | ICD-10-CM | POA: Diagnosis not present

## 2018-07-02 DIAGNOSIS — Z79899 Other long term (current) drug therapy: Secondary | ICD-10-CM | POA: Diagnosis not present

## 2018-07-02 DIAGNOSIS — E7800 Pure hypercholesterolemia, unspecified: Secondary | ICD-10-CM | POA: Diagnosis not present

## 2018-07-02 DIAGNOSIS — R946 Abnormal results of thyroid function studies: Secondary | ICD-10-CM | POA: Diagnosis not present

## 2018-07-02 DIAGNOSIS — Z125 Encounter for screening for malignant neoplasm of prostate: Secondary | ICD-10-CM | POA: Diagnosis not present

## 2018-07-04 DIAGNOSIS — R82998 Other abnormal findings in urine: Secondary | ICD-10-CM | POA: Diagnosis not present

## 2018-07-09 DIAGNOSIS — I4892 Unspecified atrial flutter: Secondary | ICD-10-CM | POA: Diagnosis not present

## 2018-07-09 DIAGNOSIS — J302 Other seasonal allergic rhinitis: Secondary | ICD-10-CM | POA: Diagnosis not present

## 2018-07-09 DIAGNOSIS — Z1331 Encounter for screening for depression: Secondary | ICD-10-CM | POA: Diagnosis not present

## 2018-07-09 DIAGNOSIS — M3130 Wegener's granulomatosis without renal involvement: Secondary | ICD-10-CM | POA: Diagnosis not present

## 2018-07-09 DIAGNOSIS — Z7901 Long term (current) use of anticoagulants: Secondary | ICD-10-CM | POA: Diagnosis not present

## 2018-07-09 DIAGNOSIS — R946 Abnormal results of thyroid function studies: Secondary | ICD-10-CM | POA: Diagnosis not present

## 2018-07-09 DIAGNOSIS — J849 Interstitial pulmonary disease, unspecified: Secondary | ICD-10-CM | POA: Diagnosis not present

## 2018-07-09 DIAGNOSIS — D649 Anemia, unspecified: Secondary | ICD-10-CM | POA: Diagnosis not present

## 2018-07-09 DIAGNOSIS — Z0000 Encounter for general adult medical examination without abnormal findings: Secondary | ICD-10-CM | POA: Diagnosis not present

## 2018-07-09 DIAGNOSIS — E7800 Pure hypercholesterolemia, unspecified: Secondary | ICD-10-CM | POA: Diagnosis not present

## 2018-07-09 DIAGNOSIS — D8989 Other specified disorders involving the immune mechanism, not elsewhere classified: Secondary | ICD-10-CM | POA: Diagnosis not present

## 2018-07-09 DIAGNOSIS — Z8546 Personal history of malignant neoplasm of prostate: Secondary | ICD-10-CM | POA: Diagnosis not present

## 2018-07-09 DIAGNOSIS — Z1339 Encounter for screening examination for other mental health and behavioral disorders: Secondary | ICD-10-CM | POA: Diagnosis not present

## 2018-07-09 DIAGNOSIS — J4540 Moderate persistent asthma, uncomplicated: Secondary | ICD-10-CM | POA: Diagnosis not present

## 2018-07-11 ENCOUNTER — Other Ambulatory Visit: Payer: Self-pay | Admitting: Internal Medicine

## 2018-07-11 NOTE — Telephone Encounter (Addendum)
Eliquis 5mg  refill request received; pt is 85 yrs old, wt-73.6kg, Crea-1.38 on 01/18/18, last seen by Dr. [name] on 03/14/2018 and has an appt scheduled for Roderic Palau on 08/23/2018; will send in refill.

## 2018-07-12 ENCOUNTER — Ambulatory Visit (HOSPITAL_COMMUNITY): Payer: Medicare Other | Admitting: Nurse Practitioner

## 2018-07-31 DIAGNOSIS — E039 Hypothyroidism, unspecified: Secondary | ICD-10-CM | POA: Diagnosis not present

## 2018-07-31 DIAGNOSIS — T462X5D Adverse effect of other antidysrhythmic drugs, subsequent encounter: Secondary | ICD-10-CM | POA: Diagnosis not present

## 2018-07-31 DIAGNOSIS — E032 Hypothyroidism due to medicaments and other exogenous substances: Secondary | ICD-10-CM | POA: Diagnosis not present

## 2018-08-02 DIAGNOSIS — H353211 Exudative age-related macular degeneration, right eye, with active choroidal neovascularization: Secondary | ICD-10-CM | POA: Diagnosis not present

## 2018-08-02 DIAGNOSIS — H2640 Unspecified secondary cataract: Secondary | ICD-10-CM | POA: Diagnosis not present

## 2018-08-02 DIAGNOSIS — H59031 Cystoid macular edema following cataract surgery, right eye: Secondary | ICD-10-CM | POA: Diagnosis not present

## 2018-08-09 DIAGNOSIS — Z961 Presence of intraocular lens: Secondary | ICD-10-CM | POA: Diagnosis not present

## 2018-08-09 DIAGNOSIS — H353210 Exudative age-related macular degeneration, right eye, stage unspecified: Secondary | ICD-10-CM | POA: Diagnosis not present

## 2018-08-09 DIAGNOSIS — H40053 Ocular hypertension, bilateral: Secondary | ICD-10-CM | POA: Diagnosis not present

## 2018-08-23 ENCOUNTER — Other Ambulatory Visit: Payer: Self-pay

## 2018-08-23 ENCOUNTER — Ambulatory Visit (HOSPITAL_COMMUNITY)
Admission: RE | Admit: 2018-08-23 | Discharge: 2018-08-23 | Disposition: A | Discharge status: Home / Self Care | Payer: Medicare Other | Source: Ambulatory Visit | Attending: Nurse Practitioner | Admitting: Nurse Practitioner

## 2018-08-23 ENCOUNTER — Encounter (HOSPITAL_COMMUNITY): Payer: Self-pay | Admitting: Physician Assistant

## 2018-08-23 VITALS — BP 128/72 | HR 75 | Ht 66.0 in | Wt 149.0 lb

## 2018-08-23 DIAGNOSIS — Z79899 Other long term (current) drug therapy: Secondary | ICD-10-CM | POA: Insufficient documentation

## 2018-08-23 DIAGNOSIS — L928 Other granulomatous disorders of the skin and subcutaneous tissue: Secondary | ICD-10-CM | POA: Insufficient documentation

## 2018-08-23 DIAGNOSIS — Z79890 Hormone replacement therapy (postmenopausal): Secondary | ICD-10-CM | POA: Diagnosis not present

## 2018-08-23 DIAGNOSIS — Z7901 Long term (current) use of anticoagulants: Secondary | ICD-10-CM | POA: Diagnosis not present

## 2018-08-23 DIAGNOSIS — E785 Hyperlipidemia, unspecified: Secondary | ICD-10-CM | POA: Diagnosis not present

## 2018-08-23 DIAGNOSIS — Z8546 Personal history of malignant neoplasm of prostate: Secondary | ICD-10-CM | POA: Diagnosis not present

## 2018-08-23 DIAGNOSIS — Z87891 Personal history of nicotine dependence: Secondary | ICD-10-CM | POA: Diagnosis not present

## 2018-08-23 DIAGNOSIS — I350 Nonrheumatic aortic (valve) stenosis: Secondary | ICD-10-CM | POA: Insufficient documentation

## 2018-08-23 DIAGNOSIS — F419 Anxiety disorder, unspecified: Secondary | ICD-10-CM | POA: Insufficient documentation

## 2018-08-23 DIAGNOSIS — I10 Essential (primary) hypertension: Secondary | ICD-10-CM | POA: Diagnosis not present

## 2018-08-23 DIAGNOSIS — Z882 Allergy status to sulfonamides status: Secondary | ICD-10-CM | POA: Insufficient documentation

## 2018-08-23 DIAGNOSIS — I4819 Other persistent atrial fibrillation: Secondary | ICD-10-CM | POA: Diagnosis not present

## 2018-08-23 DIAGNOSIS — Z8249 Family history of ischemic heart disease and other diseases of the circulatory system: Secondary | ICD-10-CM | POA: Diagnosis not present

## 2018-08-23 DIAGNOSIS — Z880 Allergy status to penicillin: Secondary | ICD-10-CM | POA: Insufficient documentation

## 2018-08-23 DIAGNOSIS — I483 Typical atrial flutter: Secondary | ICD-10-CM | POA: Diagnosis not present

## 2018-08-23 DIAGNOSIS — K219 Gastro-esophageal reflux disease without esophagitis: Secondary | ICD-10-CM | POA: Diagnosis not present

## 2018-08-23 NOTE — Progress Notes (Addendum)
Primary Care Physician: Tisovec, Fransico Him, MD Referring Physician: Dr. Rosalyn Charters Chad Morrison is a 85 y.o. male with a h/o typical atrial flutter, on amiodarone for evaluation in the afib clinic. He saw Dr. [name] in August and was c/o of shortness of breath. There was concern of amiodarone toxicity and pt asked to reduce amio to 100 mg a day. He had refused typical atrial flutter ablation in the past. He saw pulmonology yesterday and found to be in rapid atrial fibrillation. Dr. [name]  was notified  and he was asked to have f/u here. He increased his amio back to 200 mg yesterday and today. Pulmonology is planning to repeat PFT's when his heart rate is controlled. 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 Pulmonology  questioned if lungs were also involved contributing to increased shortness of breath.Pt is unaware of his RVR. He seems to be tolerating well.  F/u in afib clinic 10/1.Pt was scheduled to have a cardioversion but he went back into SR with increased dose of amiodarone to 200 mg bid.He was then instructed to lower to 200 mg daily. He was seen in ENTand has to have occasional  thickened dried nasal secretions removed,2/2 Wegner's disease. This was very painful and he noted he was in afib again this am.  F/u in afib clinic, 11/8. He saw Dr. [name], 10/10. Amiodarone was stopped and multaq was started. He reports that he has felt very well and has not  noted any afib. However, his EKG today shows afib. He  was surprised. He  did notice that he felt tired today. He worked in the yard most of yesterday and felt great.  F/u in afib clinic, 11/18, pt is in [place]. He has been in SR since I last saw him. He reviews with me his HR readings at home and has been in the 60's. On last visit here,  he was running around 110 in atrial flutter.  F/u in AF clinic 08/23/18. Patient reports that overall he has done well since his last visit. He does states that about twice a month,  he has nights where his legs hurt after walking/exercising and he doesn't sleep well. The next day he feels more fatigued. He feels fatigued today and is in rate-controlled afib. No CP, SOB, or heart racing. He also states that by the afternoon he feels much better.  Today, he denies symptoms of  chest pain, shortness of breath, orthopnea, PND, lower extremity edema, dizziness, presyncope, syncope, or neurologic sequela. The patient is tolerating medications without difficulties and is otherwise without complaint today. +fatigue  Past Medical History:  Diagnosis Date   Anxiety    Asthma    Asthmatic bronchitis    Atrial flutter ([place])    chads2vasc score is 3   Ehrlichiosis 1610   GERD (gastroesophageal reflux disease)    Granulomatosis with polyangiitis (HCC)    HTN (hypertension)    Hyperlipidemia    Paroxysmal atrial fibrillation (HCC)    Prostate cancer (HCC)    Seasonal allergies    Past Surgical History:  Procedure Laterality Date   BASAL CELL CARCINOMA EXCISION  2013   EYELID   HERNIA REPAIR Bilateral 2000, 2002   KNEE ARTHROSCOPY  2000   prostectomy  1995   RADICAL   TONSILLECTOMY  1940    Current Outpatient Medications  Medication Sig Dispense Refill   albuterol (PROVENTIL HFA;VENTOLIN HFA) 108 (90 Base) MCG/ACT inhaler USE 1 TO 2 INHALATIONS  ORALLY EVERY 6 HOURS AS    NEEDED FOR WHEEZING OR     SHORTNESS OF BREATH 54 g 3   ALPRAZolam (XANAX) 0.25 MG tablet Take 0.125 mg by mouth at bedtime as needed for sleep.      bimatoprost (LUMIGAN) 0.03 % ophthalmic solution Place 1 drop into both eyes at bedtime.      budesonide-formoterol (SYMBICORT) 160-4.5 MCG/ACT inhaler Inhale 2 puffs into the lungs 2 (two) times daily. 3 Inhaler 3   calcium carbonate (OSCAL) 1500 (600 Ca) MG TABS tablet Take 600 mg of elemental calcium by mouth 2 (two) times daily with a meal.     diltiazem (CARDIZEM CD) 120 MG 24 hr capsule TAKE ONE CAPSULE BY MOUTH TWICE DAILY(  DOSE INCREASE) 60 capsule 3   diphenhydrAMINE (BENADRYL) 25 MG tablet Take 50 mg by mouth at bedtime.      dronedarone (MULTAQ) 400 MG tablet Take 1 tablet (400 mg total) by mouth 2 (two) times daily with a meal. 60 tablet 11   ELIQUIS 5 MG TABS tablet TAKE 1 TABLET TWICE A DAY 595 tablet 2   folic acid (FOLVITE) 1 MG tablet Take 1 mg by mouth daily.     levocetirizine (XYZAL) 5 MG tablet Take 1 tablet (5 mg total) by mouth every evening. 93 tablet 3   levothyroxine (SYNTHROID, LEVOTHROID) 75 MCG tablet Take 75 mcg by mouth daily before breakfast.     methotrexate (RHEUMATREX) 2.5 MG tablet Take 25 mg by mouth every Wednesday. Caution:Chemotherapy. Protect from light.      montelukast (SINGULAIR) 10 MG tablet Take 10 mg at bedtime by mouth.     Multiple Vitamin (MULTIVITAMIN WITH MINERALS) TABS tablet Take 1 tablet daily by mouth.     Multiple Vitamins-Minerals (OCUVITE PRESERVISION PO) Take 1 tablet 2 (two) times daily by mouth.     Netarsudil Dimesylate (RHOPRESSA) 0.02 % SOLN Place 1 drop into the right eye daily.     omeprazole (PRILOSEC) 40 MG capsule Take 40 mg daily by mouth.     predniSONE (DELTASONE) 5 MG tablet 2.5 mg alternating every other day     simvastatin (ZOCOR) 10 MG tablet Take 10 mg by mouth every evening.      triamcinolone (NASACORT ALLERGY 24HR) 55 MCG/ACT AERO nasal inhaler Place 2 sprays into the nose 2 (two) times daily.      No current facility-administered medications for this visit.     Allergies  Allergen Reactions   Penicillins Hives    Has patient had a PCN reaction causing immediate rash, facial/tongue/throat swelling, SOB or lightheadedness with hypotension: No Has patient had a PCN reaction causing severe rash involving mucus membranes or skin necrosis: No Has patient had a PCN reaction that required hospitalization: No Has patient had a PCN reaction occurring within the last 10 years: Yes If all of the above answers are "NO", then may  proceed with Cephalosporin use.    Sulfa Antibiotics Other (See Comments)    Unknown to patient    Latex Hives and Rash    Social History   Socioeconomic History   Marital status: Married    Spouse name: [name]   Number of children: 2   Years of education: Not on file   Highest education level: Not on file  Occupational History   Occupation: RETIRED  Scientist, product/process development strain: Not on file   Food insecurity    Worry: Not on file    Inability: Not on file  Transportation needs    Medical: Not on file    Non-medical: Not on file  Tobacco Use   Smoking status: Former Smoker    Packs/day: 0.50    Years: 4.00    Pack years: 2.00    Types: Cigarettes    Quit date: 02/28/1961    Years since quitting: 57.5   Smokeless tobacco: Never Used  Substance and Sexual Activity   Alcohol use: Yes   Drug use: No   Sexual activity: Not on file  Lifestyle   Physical activity    Days per week: Not on file    Minutes per session: Not on file   Stress: Not on file  Relationships   Social connections    Talks on phone: Not on file    Gets together: Not on file    Attends religious service: Not on file    Active member of club or organization: Not on file    Attends meetings of clubs or organizations: Not on file    Relationship status: Not on file   Intimate partner violence    Fear of current or ex partner: Not on file    Emotionally abused: Not on file    Physically abused: Not on file    Forced sexual activity: Not on file  Other Topics Concern   Not on file  Social History Narrative   Pt recently moved to [place] from [place] to be near his son.   Retired.    Family History  Problem Relation Age of Onset   Asthma Mother 54   Allergies Mother    Ulcers Mother    CVA Father 5   Heart Problems Father    Other Father        NEUROLOGIC ISSUES    Other Brother 35       BICYCLE ACCIDENT   Migraines Son    Diabetes  Mellitus I Son     ROS- All systems are reviewed and negative except as per the HPI above  Physical Exam: There were no vitals filed for this visit. Wt Readings from Last 3 Encounters:  05/09/18 162 lb 3.2 oz (73.6 kg)  03/16/18 163 lb (73.9 kg)  03/14/18 162 lb 6.4 oz (73.7 kg)    Labs: Lab Results  Component Value Date   NA 138 01/18/2018   K 4.3 01/18/2018   CL 109 01/18/2018   CO2 20 (L) 01/18/2018   GLUCOSE 132 (H) 01/18/2018   BUN 30 (H) 01/18/2018   CREATININE 1.38 (H) 01/18/2018   CALCIUM 9.5 01/18/2018   No results found for: INR No results found for: CHOL, HDL, LDLCALC, TRIG   GEN- The patient is well appearing elderly male, alert and oriented x 3 today.   HEENT-head normocephalic, atraumatic, sclera clear, conjunctiva pink, hearing intact, trachea midline. Lungs- Clear to ausculation bilaterally, normal work of breathing Heart- irregular rate and rhythm, no rubs or gallops. 3/6 systolic murmur   GI- soft, NT, ND, + BS Extremities- no clubbing, cyanosis, or edema MS- no significant deformity or atrophy Skin- no rash or lesion Psych- euthymic mood, full affect Neuro- strength and sensation are intact   EKG- coarse afib vs atypical flutter HR 75, RBBB, QRS 158, QTc 515. qtc is acceptable (RBBB contributing)  Epic records reviewed  Echo 10/23/17 - Left ventricle: The cavity size was normal. There was mild   concentric hypertrophy. Systolic function was normal. The   estimated ejection fraction was in the range of 60% to 65%.  Wall   motion was normal; there were no regional wall motion   abnormalities. Features are consistent with a pseudonormal left   ventricular filling pattern, with concomitant abnormal relaxation   and increased filling pressure (grade 2 diastolic dysfunction).   Doppler parameters are consistent with indeterminate ventricular   filling pressure. - Aortic valve: Valve mobility was restricted. There was moderate   stenosis. - Aorta:  Ascending aortic diameter: 37 mm (S). - Ascending aorta: The ascending aorta was mildly dilated. - Mitral valve: Transvalvular velocity was within the normal range.   There was no evidence for stenosis. There was mild regurgitation. - Left atrium: The atrium was severely dilated. - Right ventricle: The cavity size was normal. Wall thickness was   normal. Systolic function was normal. - Right atrium: The atrium was mildly dilated. - Atrial septum: No defect or patent foramen ovale was identified   by color flow Doppler. - Tricuspid valve: There was mild regurgitation. - Pulmonary arteries: Systolic pressure was within the normal   range. PA peak pressure: 35 mm Hg (S).   Assessment and Plan: 1. Persistent atrial fibrillation/typical atrial flutter Amiodarone stopped 2/2 lung disease and pt  has been on Multaq  Overall patient is happy with present therapy with infrequent breakthrough episodes.  He has been for the most part staying in SR Continue cardizem 120 mg bid  Continue Multaq 400 mg BID Discussed that lack of sleep can trigger afib. Patient to follow up with PCP about nocturnal leg pain/restlessness.   This patients CHA2DS2-VASc Score and unadjusted Ischemic Stroke Rate (% per year) is equal to 3.2 % stroke rate/year from a score of 3  Above score calculated as 1 point each if present [CHF, HTN, DM, Vascular=MI/PAD/Aortic Plaque, Age if 65-74, or Male] Above score calculated as 2 points each if present [Age > 75, or Stroke/TIA/TE]   2. Dyspnea/granulomatous vasculitis Per pulmonology   3. Aortic Stenosis Mod by echo 09/2017 Repeat echo after next visit.   F/u in AF clinic in 3 months   [facility] [address] [place], [place] [address] [phone]

## 2018-08-23 NOTE — Addendum Note (Signed)
Encounter addended by: Oliver Barre, PA on: 08/23/2018 11:49 AM  Actions taken: Clinical Note Signed

## 2018-08-27 DIAGNOSIS — C44612 Basal cell carcinoma of skin of right upper limb, including shoulder: Secondary | ICD-10-CM | POA: Diagnosis not present

## 2018-08-27 DIAGNOSIS — L819 Disorder of pigmentation, unspecified: Secondary | ICD-10-CM | POA: Diagnosis not present

## 2018-08-27 DIAGNOSIS — D485 Neoplasm of uncertain behavior of skin: Secondary | ICD-10-CM | POA: Diagnosis not present

## 2018-08-27 DIAGNOSIS — Z85828 Personal history of other malignant neoplasm of skin: Secondary | ICD-10-CM | POA: Diagnosis not present

## 2018-09-06 DIAGNOSIS — R238 Other skin changes: Secondary | ICD-10-CM | POA: Diagnosis not present

## 2018-09-06 DIAGNOSIS — C44612 Basal cell carcinoma of skin of right upper limb, including shoulder: Secondary | ICD-10-CM | POA: Diagnosis not present

## 2018-09-06 DIAGNOSIS — L989 Disorder of the skin and subcutaneous tissue, unspecified: Secondary | ICD-10-CM | POA: Diagnosis not present

## 2018-09-19 DIAGNOSIS — M3130 Wegener's granulomatosis without renal involvement: Secondary | ICD-10-CM | POA: Diagnosis not present

## 2018-09-19 DIAGNOSIS — H353211 Exudative age-related macular degeneration, right eye, with active choroidal neovascularization: Secondary | ICD-10-CM | POA: Diagnosis not present

## 2018-09-19 DIAGNOSIS — H59031 Cystoid macular edema following cataract surgery, right eye: Secondary | ICD-10-CM | POA: Diagnosis not present

## 2018-09-19 DIAGNOSIS — H2640 Unspecified secondary cataract: Secondary | ICD-10-CM | POA: Diagnosis not present

## 2018-09-20 DIAGNOSIS — C44612 Basal cell carcinoma of skin of right upper limb, including shoulder: Secondary | ICD-10-CM | POA: Diagnosis not present

## 2018-09-20 DIAGNOSIS — C44519 Basal cell carcinoma of skin of other part of trunk: Secondary | ICD-10-CM | POA: Diagnosis not present

## 2018-09-20 DIAGNOSIS — C44622 Squamous cell carcinoma of skin of right upper limb, including shoulder: Secondary | ICD-10-CM | POA: Diagnosis not present

## 2018-10-09 DIAGNOSIS — L821 Other seborrheic keratosis: Secondary | ICD-10-CM | POA: Diagnosis not present

## 2018-10-09 DIAGNOSIS — C44319 Basal cell carcinoma of skin of other parts of face: Secondary | ICD-10-CM | POA: Diagnosis not present

## 2018-10-09 DIAGNOSIS — D1801 Hemangioma of skin and subcutaneous tissue: Secondary | ICD-10-CM | POA: Diagnosis not present

## 2018-10-09 DIAGNOSIS — D485 Neoplasm of uncertain behavior of skin: Secondary | ICD-10-CM | POA: Diagnosis not present

## 2018-10-09 DIAGNOSIS — D225 Melanocytic nevi of trunk: Secondary | ICD-10-CM | POA: Diagnosis not present

## 2018-10-09 DIAGNOSIS — L570 Actinic keratosis: Secondary | ICD-10-CM | POA: Diagnosis not present

## 2018-10-09 DIAGNOSIS — Z85828 Personal history of other malignant neoplasm of skin: Secondary | ICD-10-CM | POA: Diagnosis not present

## 2018-10-11 DIAGNOSIS — M3130 Wegener's granulomatosis without renal involvement: Secondary | ICD-10-CM | POA: Diagnosis not present

## 2018-10-11 DIAGNOSIS — Z79899 Other long term (current) drug therapy: Secondary | ICD-10-CM | POA: Diagnosis not present

## 2018-10-18 DIAGNOSIS — M3130 Wegener's granulomatosis without renal involvement: Secondary | ICD-10-CM | POA: Diagnosis not present

## 2018-10-18 DIAGNOSIS — Z79899 Other long term (current) drug therapy: Secondary | ICD-10-CM | POA: Diagnosis not present

## 2018-10-19 ENCOUNTER — Other Ambulatory Visit (HOSPITAL_COMMUNITY): Payer: Self-pay | Admitting: Internal Medicine

## 2018-10-22 ENCOUNTER — Encounter: Payer: Self-pay | Admitting: Adult Health

## 2018-10-22 ENCOUNTER — Other Ambulatory Visit: Payer: Self-pay

## 2018-10-22 ENCOUNTER — Ambulatory Visit (INDEPENDENT_AMBULATORY_CARE_PROVIDER_SITE_OTHER): Payer: Medicare Other | Admitting: Adult Health

## 2018-10-22 DIAGNOSIS — M3130 Wegener's granulomatosis without renal involvement: Secondary | ICD-10-CM

## 2018-10-22 DIAGNOSIS — J310 Chronic rhinitis: Secondary | ICD-10-CM

## 2018-10-22 DIAGNOSIS — J4540 Moderate persistent asthma, uncomplicated: Secondary | ICD-10-CM | POA: Diagnosis not present

## 2018-10-22 DIAGNOSIS — J45909 Unspecified asthma, uncomplicated: Secondary | ICD-10-CM | POA: Insufficient documentation

## 2018-10-22 DIAGNOSIS — R918 Other nonspecific abnormal finding of lung field: Secondary | ICD-10-CM | POA: Diagnosis not present

## 2018-10-22 MED ORDER — MONTELUKAST SODIUM 10 MG PO TABS: 10.0000 mg | ORAL_TABLET | Freq: Every day | ORAL | 3 refills | Status: DC

## 2018-10-22 NOTE — Patient Instructions (Addendum)
Continue on Symbicort 2 puffs Twice daily  ,rinse after use.  Continue on Singulair daily  Continue on Xyzal daily  Continue on Saline nasal rinses Twice daily   CT chest in March to follow up on lung nodules Continue follow up with Rheumatology at [facility] .  Get Flu shot as discussed.  Follow up with Dr. Vaughan Browner in 6 months and As needed   Please contact office for sooner follow up if symptoms do not improve or worsen or seek emergency care

## 2018-10-22 NOTE — Addendum Note (Signed)
Addended by: Parke Poisson E on: 10/22/2018 04:04 PM   Modules accepted: Orders

## 2018-10-22 NOTE — Progress Notes (Signed)
Virtual Visit via Telephone Note  I connected with Chad Morrison on 10/22/18 at  2:30 PM EDT by telephone and verified that I am speaking with the correct person using two identifiers.  Location: Patient: Home  Provider: Office    I discussed the limitations, risks, security and privacy concerns of performing an evaluation and management service by telephone and the availability of in person appointments. I also discussed with the patient that there may be a patient responsible charge related to this service. The patient expressed understanding and agreed to proceed.   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 (previous on prednisone -tapered off 04/2018) , A. Fib on Multaq (previously on amiodarone)   Today's tele-visit is a 59-month follow-up for asthma.  Patient says overall since last visit his breathing has been doing okay.  He denies any flare of cough or wheezing. Remains on Symbicort and Xyzal .  He uses Saline nasal rinses Twice daily  . Has very strong air filters in house, seems to really help.  Has occasional use of his albuterol inhaler.  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  Follows with [facility]ology at [facility].  Has been weaned off of prednisone since March 2020  CT chest in March of this year showed stable scattered lung nodules measuring max up to 5 mm.  We discussed a follow-up CT chest in March 2021   Observations/Objective: CT chest March 2020 small scattered nonspecific lung nodules stable since October 2019 measure up to 5 mm  High-resolution CT chest October 2019 basilar predominant subpleural groundglass and mild reticulations may be due to nonspecific interstitial pneumonitis, indeterminate for UIP, Multiple pulmonary nodules less than 6 mm   March 2019 ratio  65%, FEV1 2.44 L 88% predicted, FVC 3.78 L 96% predicted, total lung capacity 7.41 L 104% predicted, residual volume 152% predict, DLCO 22.8 70% predicted   Assessment and Plan: Chronic Asthma - controlled on current regimen   AR -continue on Xyzal and saline nasal rinses twice daily.  Continue on Singulair daily  Pulmonary nodules-stable on serial CT chest.  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  Patient is continue to follow-up with rheumatology at [facility]  Patient Instructions  Continue on Symbicort 2 puffs Twice daily  ,rinse after use.  Continue on Singulair daily  Continue on Xyzal daily  Continue on Saline nasal rinses Twice daily   CT chest in March to follow up on lung nodules Continue follow up with Rheumatology at [facility] .  Get Flu shot as discussed.  Follow up with Dr. Vaughan Browner in 6 months and As needed   Please contact office for sooner follow up if symptoms do not improve or worsen or seek emergency care         Follow Up Instructions: Follow up with Dr. Vaughan Browner in 6 months and As needed     I discussed the assessment and treatment plan with the patient. The patient was provided an opportunity to ask questions and all were answered. The patient agreed with the plan and demonstrated an understanding of the instructions.   The patient was advised to call back or seek an in-person evaluation if the symptoms worsen or if the condition fails to improve as anticipated.  I provided 22 minutes of non-face-to-face time during this encounter.   Chad Morrison  Alveria Mcglaughlin, NP

## 2018-10-23 ENCOUNTER — Other Ambulatory Visit: Payer: Self-pay | Admitting: Pulmonary Disease

## 2018-10-23 DIAGNOSIS — H59031 Cystoid macular edema following cataract surgery, right eye: Secondary | ICD-10-CM | POA: Diagnosis not present

## 2018-10-23 DIAGNOSIS — H3561 Retinal hemorrhage, right eye: Secondary | ICD-10-CM | POA: Diagnosis not present

## 2018-10-23 DIAGNOSIS — H353211 Exudative age-related macular degeneration, right eye, with active choroidal neovascularization: Secondary | ICD-10-CM | POA: Diagnosis not present

## 2018-10-24 DIAGNOSIS — L570 Actinic keratosis: Secondary | ICD-10-CM | POA: Diagnosis not present

## 2018-10-24 DIAGNOSIS — D485 Neoplasm of uncertain behavior of skin: Secondary | ICD-10-CM | POA: Diagnosis not present

## 2018-10-24 DIAGNOSIS — C44729 Squamous cell carcinoma of skin of left lower limb, including hip: Secondary | ICD-10-CM | POA: Diagnosis not present

## 2018-10-24 DIAGNOSIS — C44719 Basal cell carcinoma of skin of left lower limb, including hip: Secondary | ICD-10-CM | POA: Diagnosis not present

## 2018-10-24 DIAGNOSIS — Z85828 Personal history of other malignant neoplasm of skin: Secondary | ICD-10-CM | POA: Diagnosis not present

## 2018-11-02 ENCOUNTER — Telehealth: Payer: Self-pay | Admitting: Adult Health

## 2018-11-02 MED ORDER — MONTELUKAST SODIUM 10 MG PO TABS: 10.0000 mg | ORAL_TABLET | Freq: Every day | ORAL | 0 refills | Status: DC

## 2018-11-02 NOTE — Telephone Encounter (Signed)
Rx has been sent in. Nothing further was needed. 

## 2018-11-06 ENCOUNTER — Ambulatory Visit: Payer: Medicare Other | Admitting: Nurse Practitioner

## 2018-11-12 DIAGNOSIS — C44319 Basal cell carcinoma of skin of other parts of face: Secondary | ICD-10-CM | POA: Diagnosis not present

## 2018-11-12 DIAGNOSIS — C44729 Squamous cell carcinoma of skin of left lower limb, including hip: Secondary | ICD-10-CM | POA: Diagnosis not present

## 2018-11-12 DIAGNOSIS — C44719 Basal cell carcinoma of skin of left lower limb, including hip: Secondary | ICD-10-CM | POA: Diagnosis not present

## 2018-11-15 ENCOUNTER — Other Ambulatory Visit: Payer: Self-pay

## 2018-11-15 ENCOUNTER — Ambulatory Visit (HOSPITAL_COMMUNITY)
Admission: RE | Admit: 2018-11-15 | Discharge: 2018-11-15 | Disposition: A | Discharge status: Home / Self Care | Payer: Medicare Other | Source: Ambulatory Visit | Attending: Physician Assistant | Admitting: Physician Assistant

## 2018-11-15 ENCOUNTER — Encounter (HOSPITAL_COMMUNITY): Payer: Self-pay | Admitting: Physician Assistant

## 2018-11-15 VITALS — BP 136/56 | HR 70 | Ht 66.0 in | Wt 145.8 lb

## 2018-11-15 DIAGNOSIS — J45909 Unspecified asthma, uncomplicated: Secondary | ICD-10-CM | POA: Diagnosis not present

## 2018-11-15 DIAGNOSIS — I4819 Other persistent atrial fibrillation: Secondary | ICD-10-CM | POA: Diagnosis not present

## 2018-11-15 DIAGNOSIS — E785 Hyperlipidemia, unspecified: Secondary | ICD-10-CM | POA: Insufficient documentation

## 2018-11-15 DIAGNOSIS — Z79890 Hormone replacement therapy (postmenopausal): Secondary | ICD-10-CM | POA: Diagnosis not present

## 2018-11-15 DIAGNOSIS — I480 Paroxysmal atrial fibrillation: Secondary | ICD-10-CM | POA: Diagnosis not present

## 2018-11-15 DIAGNOSIS — I776 Arteritis, unspecified: Secondary | ICD-10-CM | POA: Diagnosis not present

## 2018-11-15 DIAGNOSIS — I10 Essential (primary) hypertension: Secondary | ICD-10-CM | POA: Insufficient documentation

## 2018-11-15 DIAGNOSIS — Z8546 Personal history of malignant neoplasm of prostate: Secondary | ICD-10-CM | POA: Diagnosis not present

## 2018-11-15 DIAGNOSIS — I350 Nonrheumatic aortic (valve) stenosis: Secondary | ICD-10-CM | POA: Insufficient documentation

## 2018-11-15 DIAGNOSIS — Z880 Allergy status to penicillin: Secondary | ICD-10-CM | POA: Insufficient documentation

## 2018-11-15 DIAGNOSIS — Z91040 Latex allergy status: Secondary | ICD-10-CM | POA: Insufficient documentation

## 2018-11-15 DIAGNOSIS — Z882 Allergy status to sulfonamides status: Secondary | ICD-10-CM | POA: Insufficient documentation

## 2018-11-15 DIAGNOSIS — K219 Gastro-esophageal reflux disease without esophagitis: Secondary | ICD-10-CM | POA: Diagnosis not present

## 2018-11-15 DIAGNOSIS — I483 Typical atrial flutter: Secondary | ICD-10-CM | POA: Diagnosis not present

## 2018-11-15 DIAGNOSIS — Z87891 Personal history of nicotine dependence: Secondary | ICD-10-CM | POA: Insufficient documentation

## 2018-11-15 DIAGNOSIS — Z7901 Long term (current) use of anticoagulants: Secondary | ICD-10-CM | POA: Diagnosis not present

## 2018-11-15 DIAGNOSIS — F419 Anxiety disorder, unspecified: Secondary | ICD-10-CM | POA: Diagnosis not present

## 2018-11-15 DIAGNOSIS — Z79899 Other long term (current) drug therapy: Secondary | ICD-10-CM | POA: Diagnosis not present

## 2018-11-15 DIAGNOSIS — Z7951 Long term (current) use of inhaled steroids: Secondary | ICD-10-CM | POA: Insufficient documentation

## 2018-11-15 NOTE — Progress Notes (Signed)
Primary Care Physician: Tisovec, Fransico Him, MD Referring Physician: Dr. Rosalyn Charters [place] is a 85 y.o. male with a h/o typical atrial flutter, on amiodarone for evaluation in the afib clinic. He saw Dr. [name] in August and was c/o of shortness of breath. There was concern of amiodarone toxicity and pt asked to reduce amio to 100 mg a day. He had refused typical atrial flutter ablation in the past. He saw pulmonology and found to be in rapid atrial fibrillation. Dr. [name]  was notified  and he was asked to have f/u here. He increased his amio back to 200 mg. Pulmonology is planning to repeat PFT's when his heart rate is controlled. 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 Pulmonology  questioned if lungs were also involved contributing to increased shortness of breath. He saw Dr. [name], 10/10. Amiodarone was stopped and multaq was started.   On follow up today, patient reports that he has done reasonably well. He purchased a Chiropodist which he checks daily. He reports that he has an episode of afib about twice per week but is usually asymptomatic. Occasionally, he does have symptoms of dyspnea with exertion.   Today, he denies symptoms of palpitations, chest pain, orthopnea, PND, lower extremity edema, dizziness, presyncope, syncope, or neurologic sequela. The patient is tolerating medications without difficulties and is otherwise without complaint today.  Past Medical History:  Diagnosis Date  . Anxiety   . Asthma   . Asthmatic bronchitis   . Atrial flutter (HCC)    chads2vasc score is 3  . Ehrlichiosis [id number]  . GERD (gastroesophageal reflux disease)   . 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 HTN (hypertension)   . Hyperlipidemia   . Paroxysmal atrial fibrillation (HCC)   . Prostate cancer ([place])   . Seasonal allergies    Past Surgical History:  Procedure Laterality Date  . BASAL CELL CARCINOMA EXCISION  2013   EYELID  . HERNIA REPAIR  Bilateral 2000, 2002  . KNEE ARTHROSCOPY  2000  . prostectomy  1995   RADICAL  . TONSILLECTOMY  1940    Current Outpatient Medications  Medication Sig Dispense Refill  . albuterol (PROVENTIL HFA;VENTOLIN HFA) 108 (90 Base) MCG/ACT inhaler USE 1 TO 2 INHALATIONS     ORALLY EVERY 6 HOURS AS    NEEDED FOR WHEEZING OR     SHORTNESS OF BREATH 54 g 3  . ALPRAZolam (XANAX) 0.25 MG tablet Take 0.125 mg by mouth at bedtime as needed for sleep.     . Bromfenac Sodium (PROLENSA) 0.07 % SOLN Apply 1 drop to eye daily.    . budesonide-formoterol (SYMBICORT) 160-4.5 MCG/ACT inhaler Inhale 2 puffs into the lungs 2 (two) times daily. 3 Inhaler 3  . calcium carbonate (OSCAL) 1500 (600 Ca) MG TABS tablet Take 600 mg of elemental calcium by mouth 2 (two) times daily with a meal.    . diltiazem (CARDIZEM CD) 120 MG 24 hr capsule TAKE ONE CAPSULE BY MOUTH TWICE DAILY. DOSE INCREASE. 60 capsule 4  . diphenhydrAMINE (BENADRYL) 25 MG tablet Take 50 mg by mouth at bedtime.     . dronedarone (MULTAQ) 400 MG tablet Take 1 tablet (400 mg total) by mouth 2 (two) times daily with a meal. 60 tablet 11  . ELIQUIS 5 MG TABS tablet TAKE 1 TABLET TWICE A DAY [id number] tablet 2  . folic acid (FOLVITE) 1 MG tablet Take 1 mg by mouth daily.    Marland Kitchen  levocetirizine (XYZAL) 5 MG tablet TAKE 1 TABLET EVERY EVENING 90 tablet 1  . levothyroxine (SYNTHROID, LEVOTHROID) 75 MCG tablet Take 75 mcg by mouth daily before breakfast.    . methotrexate (RHEUMATREX) 2.5 MG tablet Take 25 mg by mouth every Wednesday. Caution:Chemotherapy. Protect from light.     . montelukast (SINGULAIR) 10 MG tablet Take 1 tablet (10 mg total) by mouth at bedtime. 30 tablet 0  . Multiple Vitamin (MULTIVITAMIN WITH MINERALS) TABS tablet Take 1 tablet daily by mouth.    . Multiple Vitamins-Minerals (OCUVITE PRESERVISION PO) Take 1 tablet 2 (two) times daily by mouth.    Marland Kitchen omeprazole (PRILOSEC) 40 MG capsule Take 40 mg daily by mouth.    . simvastatin (ZOCOR) 10 MG tablet  Take 10 mg by mouth every evening.      No current facility-administered medications for this visit.     Allergies  Allergen Reactions  . Penicillins Hives    Has patient had a PCN reaction causing immediate rash, facial/tongue/throat swelling, SOB or lightheadedness with hypotension: No Has patient had a PCN reaction causing severe rash involving mucus membranes or skin necrosis: No Has patient had a PCN reaction that required hospitalization: No Has patient had a PCN reaction occurring within the last 10 years: Yes If all of the above answers are "NO", then may proceed with Cephalosporin use.   . Sulfa Antibiotics Other (See Comments)    Unknown to patient   . Latex Hives and Rash    Social History   Socioeconomic History  . Marital status: Married    Spouse name: [name]  . Number of children: 2  . Years of education: Not on file  . Highest education level: Not on file  Occupational History  . Occupation: RETIRED  Social Needs  . Financial resource strain: Not on file  . Food insecurity    Worry: Not on file    Inability: Not on file  . Transportation needs    Medical: Not on file    Non-medical: Not on file  Tobacco Use  . Smoking status: Former Smoker    Packs/day: 0.50    Years: 4.00    Pack years: 2.00    Types: Cigarettes    Quit date: 02/28/1961    Years since quitting: 57.7  . Smokeless tobacco: Never Used  Substance and Sexual Activity  . Alcohol use: Yes  . Drug use: No  . Sexual activity: Not on file  Lifestyle  . Physical activity    Days per week: Not on file    Minutes per session: Not on file  . Stress: Not on file  Relationships  . Social Herbalist on phone: Not on file    Gets together: Not on file    Attends religious service: Not on file    Active member of club or organization: Not on file    Attends meetings of clubs or organizations: Not on file    Relationship status: Not on file  . Intimate partner violence    Fear  of current or ex partner: Not on file    Emotionally abused: Not on file    Physically abused: Not on file    Forced sexual activity: Not on file  Other Topics Concern  . Not on file  Social History Narrative   Pt recently moved to [place] from [place] to be near his son.   Retired.    Family History  Problem Relation Age of Onset  .  Asthma Mother 11  . Allergies Mother   . Ulcers Mother   . CVA Father 12  . Heart Problems Father   . Other Father        NEUROLOGIC ISSUES   . Other Brother 11       BICYCLE ACCIDENT  . Migraines Son   . Diabetes Mellitus I Son     ROS- All systems are reviewed and negative except as per the HPI above  Physical Exam: There were no vitals filed for this visit. Wt Readings from Last 3 Encounters:  08/23/18 149 lb (67.6 kg)  05/09/18 162 lb 3.2 oz (73.6 kg)  03/16/18 163 lb (73.9 kg)    Labs: Lab Results  Component Value Date   NA 138 01/18/2018   K 4.3 01/18/2018   CL 109 01/18/2018   CO2 20 (L) 01/18/2018   GLUCOSE 132 (H) 01/18/2018   BUN 30 (H) 01/18/2018   CREATININE 1.38 (H) 01/18/2018   CALCIUM 9.5 01/18/2018   No results found for: INR No results found for: CHOL, HDL, LDLCALC, TRIG   GEN- The patient is well appearing elderly male, alert and oriented x 3 today.   HEENT-head normocephalic, atraumatic, sclera clear, conjunctiva pink, hearing intact, trachea midline. Lungs- Clear to ausculation bilaterally, normal work of breathing Heart- Regular rate and rhythm, no rubs or gallops. 3/6 systolic murmur GI- soft, NT, ND, + BS Extremities- no clubbing, cyanosis, or edema MS- no significant deformity or atrophy Skin- no rash or lesion Psych- euthymic mood, full affect Neuro- strength and sensation are intact   EKG- SR HR 70, RBBB, PR 174, QRS 160, QTc 505  Epic records reviewed  Echo 10/23/17 - Left ventricle: The cavity size was normal. There was mild   concentric hypertrophy. Systolic function was normal. The    estimated ejection fraction was in the range of 60% to 65%. Wall   motion was normal; there were no regional wall motion   abnormalities. Features are consistent with a pseudonormal left   ventricular filling pattern, with concomitant abnormal relaxation   and increased filling pressure (grade 2 diastolic dysfunction).   Doppler parameters are consistent with indeterminate ventricular   filling pressure. - Aortic valve: Valve mobility was restricted. There was moderate   stenosis. - Aorta: Ascending aortic diameter: 37 mm (S). - Ascending aorta: The ascending aorta was mildly dilated. - Mitral valve: Transvalvular velocity was within the normal range.   There was no evidence for stenosis. There was mild regurgitation. - Left atrium: The atrium was severely dilated. - Right ventricle: The cavity size was normal. Wall thickness was   normal. Systolic function was normal. - Right atrium: The atrium was mildly dilated. - Atrial septum: No defect or patent foramen ovale was identified   by color flow Doppler. - Tricuspid valve: There was mild regurgitation. - Pulmonary arteries: Systolic pressure was within the normal   range. PA peak pressure: 35 mm Hg (S).   Assessment and Plan: 1. Persistent atrial fibrillation/typical atrial flutter Amiodarone stopped 2/2 lung disease. Patient satisfied with present therapy. We discussed changing AAD if he should have more persistent symptoms. Ultimately, he may be a good candidate for rate control given he is asymptomatic and rate controlled during many of his episodes. Continue cardizem 120 mg bid  Continue Multaq 400 mg BID  This patients CHA2DS2-VASc Score and unadjusted Ischemic Stroke Rate (% per year) is equal to 3.2 % stroke rate/year from a score of 3  Above score calculated  as 1 point each if present [CHF, HTN, DM, Vascular=MI/PAD/Aortic Plaque, Age if 74-74, or Male] Above score calculated as 2 points each if present [Age > 75, or  Stroke/TIA/TE]   2. Granulomatous vasculitis Per pulmonary  3. Aortic Stenosis Mod by echo 09/2017 Repeat echo.    F/u in AF clinic in 4 months   [facility] [address] [place], [place] [address] [phone]

## 2018-11-22 ENCOUNTER — Other Ambulatory Visit: Payer: Self-pay | Admitting: Internal Medicine

## 2018-11-22 DIAGNOSIS — C44319 Basal cell carcinoma of skin of other parts of face: Secondary | ICD-10-CM | POA: Diagnosis not present

## 2018-11-22 MED ORDER — DRONEDARONE HCL 400 MG PO TABS: 400.0000 mg | ORAL_TABLET | Freq: Two times a day (BID) | ORAL | 2 refills | Status: DC

## 2018-11-27 DIAGNOSIS — H2640 Unspecified secondary cataract: Secondary | ICD-10-CM | POA: Diagnosis not present

## 2018-11-27 DIAGNOSIS — M3130 Wegener's granulomatosis without renal involvement: Secondary | ICD-10-CM | POA: Diagnosis not present

## 2018-11-27 DIAGNOSIS — H353211 Exudative age-related macular degeneration, right eye, with active choroidal neovascularization: Secondary | ICD-10-CM | POA: Diagnosis not present

## 2018-11-27 DIAGNOSIS — H59031 Cystoid macular edema following cataract surgery, right eye: Secondary | ICD-10-CM | POA: Diagnosis not present

## 2018-11-29 DIAGNOSIS — Z23 Encounter for immunization: Secondary | ICD-10-CM | POA: Diagnosis not present

## 2018-12-03 DIAGNOSIS — C44612 Basal cell carcinoma of skin of right upper limb, including shoulder: Secondary | ICD-10-CM | POA: Diagnosis not present

## 2018-12-03 DIAGNOSIS — Z85828 Personal history of other malignant neoplasm of skin: Secondary | ICD-10-CM | POA: Diagnosis not present

## 2018-12-03 DIAGNOSIS — L905 Scar conditions and fibrosis of skin: Secondary | ICD-10-CM | POA: Diagnosis not present

## 2018-12-03 DIAGNOSIS — C44519 Basal cell carcinoma of skin of other part of trunk: Secondary | ICD-10-CM | POA: Diagnosis not present

## 2018-12-06 DIAGNOSIS — C44729 Squamous cell carcinoma of skin of left lower limb, including hip: Secondary | ICD-10-CM | POA: Diagnosis not present

## 2018-12-06 DIAGNOSIS — C44719 Basal cell carcinoma of skin of left lower limb, including hip: Secondary | ICD-10-CM | POA: Diagnosis not present

## 2018-12-10 ENCOUNTER — Ambulatory Visit (HOSPITAL_COMMUNITY): Payer: Medicare Other

## 2018-12-17 ENCOUNTER — Other Ambulatory Visit (HOSPITAL_COMMUNITY): Payer: Medicare Other

## 2018-12-18 ENCOUNTER — Other Ambulatory Visit (HOSPITAL_COMMUNITY): Payer: Medicare Other

## 2018-12-20 ENCOUNTER — Other Ambulatory Visit: Payer: Self-pay

## 2018-12-20 ENCOUNTER — Ambulatory Visit (HOSPITAL_COMMUNITY)
Admission: RE | Admit: 2018-12-20 | Discharge: 2018-12-20 | Disposition: A | Discharge status: Home / Self Care | Payer: Medicare Other | Source: Ambulatory Visit | Attending: Physician Assistant | Admitting: Physician Assistant

## 2018-12-20 DIAGNOSIS — Z87891 Personal history of nicotine dependence: Secondary | ICD-10-CM | POA: Diagnosis not present

## 2018-12-20 DIAGNOSIS — I082 Rheumatic disorders of both aortic and tricuspid valves: Secondary | ICD-10-CM | POA: Insufficient documentation

## 2018-12-20 DIAGNOSIS — I480 Paroxysmal atrial fibrillation: Secondary | ICD-10-CM | POA: Insufficient documentation

## 2018-12-20 DIAGNOSIS — J449 Chronic obstructive pulmonary disease, unspecified: Secondary | ICD-10-CM | POA: Insufficient documentation

## 2018-12-20 NOTE — Progress Notes (Signed)
  Echocardiogram 2D Echocardiogram has been performed.  Chad Morrison 12/20/2018, 2:58 PM

## 2018-12-21 ENCOUNTER — Encounter (HOSPITAL_COMMUNITY): Payer: Self-pay | Admitting: *Deleted

## 2018-12-26 DIAGNOSIS — L853 Xerosis cutis: Secondary | ICD-10-CM | POA: Diagnosis not present

## 2018-12-26 DIAGNOSIS — C4359 Malignant melanoma of other part of trunk: Secondary | ICD-10-CM | POA: Diagnosis not present

## 2018-12-26 DIAGNOSIS — L819 Disorder of pigmentation, unspecified: Secondary | ICD-10-CM | POA: Diagnosis not present

## 2018-12-26 DIAGNOSIS — D0359 Melanoma in situ of other part of trunk: Secondary | ICD-10-CM | POA: Diagnosis not present

## 2018-12-26 DIAGNOSIS — D485 Neoplasm of uncertain behavior of skin: Secondary | ICD-10-CM | POA: Diagnosis not present

## 2018-12-26 DIAGNOSIS — Z85828 Personal history of other malignant neoplasm of skin: Secondary | ICD-10-CM | POA: Diagnosis not present

## 2019-01-03 DIAGNOSIS — M3130 Wegener's granulomatosis without renal involvement: Secondary | ICD-10-CM | POA: Diagnosis not present

## 2019-01-03 DIAGNOSIS — H353211 Exudative age-related macular degeneration, right eye, with active choroidal neovascularization: Secondary | ICD-10-CM | POA: Diagnosis not present

## 2019-01-03 DIAGNOSIS — H3561 Retinal hemorrhage, right eye: Secondary | ICD-10-CM | POA: Diagnosis not present

## 2019-01-09 DIAGNOSIS — C44529 Squamous cell carcinoma of skin of other part of trunk: Secondary | ICD-10-CM | POA: Diagnosis not present

## 2019-01-09 DIAGNOSIS — C4359 Malignant melanoma of other part of trunk: Secondary | ICD-10-CM | POA: Diagnosis not present

## 2019-01-14 DIAGNOSIS — J849 Interstitial pulmonary disease, unspecified: Secondary | ICD-10-CM | POA: Diagnosis not present

## 2019-01-14 DIAGNOSIS — N393 Stress incontinence (female) (male): Secondary | ICD-10-CM | POA: Diagnosis not present

## 2019-01-14 DIAGNOSIS — I4892 Unspecified atrial flutter: Secondary | ICD-10-CM | POA: Diagnosis not present

## 2019-01-14 DIAGNOSIS — D8989 Other specified disorders involving the immune mechanism, not elsewhere classified: Secondary | ICD-10-CM | POA: Diagnosis not present

## 2019-01-14 DIAGNOSIS — M859 Disorder of bone density and structure, unspecified: Secondary | ICD-10-CM | POA: Diagnosis not present

## 2019-01-14 DIAGNOSIS — E7800 Pure hypercholesterolemia, unspecified: Secondary | ICD-10-CM | POA: Diagnosis not present

## 2019-01-14 DIAGNOSIS — J302 Other seasonal allergic rhinitis: Secondary | ICD-10-CM | POA: Diagnosis not present

## 2019-01-14 DIAGNOSIS — D649 Anemia, unspecified: Secondary | ICD-10-CM | POA: Diagnosis not present

## 2019-01-14 DIAGNOSIS — R946 Abnormal results of thyroid function studies: Secondary | ICD-10-CM | POA: Diagnosis not present

## 2019-01-14 DIAGNOSIS — M3130 Wegener's granulomatosis without renal involvement: Secondary | ICD-10-CM | POA: Diagnosis not present

## 2019-01-14 DIAGNOSIS — Z7901 Long term (current) use of anticoagulants: Secondary | ICD-10-CM | POA: Diagnosis not present

## 2019-01-29 DIAGNOSIS — T462X5D Adverse effect of other antidysrhythmic drugs, subsequent encounter: Secondary | ICD-10-CM | POA: Diagnosis not present

## 2019-01-29 DIAGNOSIS — E032 Hypothyroidism due to medicaments and other exogenous substances: Secondary | ICD-10-CM | POA: Diagnosis not present

## 2019-02-06 DIAGNOSIS — H40053 Ocular hypertension, bilateral: Secondary | ICD-10-CM | POA: Diagnosis not present

## 2019-02-06 DIAGNOSIS — Z961 Presence of intraocular lens: Secondary | ICD-10-CM | POA: Diagnosis not present

## 2019-02-06 DIAGNOSIS — H353210 Exudative age-related macular degeneration, right eye, stage unspecified: Secondary | ICD-10-CM | POA: Diagnosis not present

## 2019-02-06 DIAGNOSIS — H25812 Combined forms of age-related cataract, left eye: Secondary | ICD-10-CM | POA: Diagnosis not present

## 2019-02-07 DIAGNOSIS — M3130 Wegener's granulomatosis without renal involvement: Secondary | ICD-10-CM | POA: Diagnosis not present

## 2019-02-12 DIAGNOSIS — H59031 Cystoid macular edema following cataract surgery, right eye: Secondary | ICD-10-CM | POA: Diagnosis not present

## 2019-02-12 DIAGNOSIS — H2640 Unspecified secondary cataract: Secondary | ICD-10-CM | POA: Diagnosis not present

## 2019-02-12 DIAGNOSIS — H353211 Exudative age-related macular degeneration, right eye, with active choroidal neovascularization: Secondary | ICD-10-CM | POA: Diagnosis not present

## 2019-02-12 DIAGNOSIS — H35371 Puckering of macula, right eye: Secondary | ICD-10-CM | POA: Diagnosis not present

## 2019-02-23 ENCOUNTER — Other Ambulatory Visit: Payer: Self-pay | Admitting: Pulmonary Disease

## 2019-02-23 ENCOUNTER — Other Ambulatory Visit: Payer: Self-pay | Admitting: Internal Medicine

## 2019-02-24 ENCOUNTER — Other Ambulatory Visit: Payer: Self-pay | Admitting: Internal Medicine

## 2019-02-25 NOTE — Telephone Encounter (Signed)
Spoke with patient per last OV with TP. Patient to be seen in February no recall was placed. Scheduled patient with Dr. Vaughan Browner on 04/09/2019 at 11am.

## 2019-03-08 ENCOUNTER — Other Ambulatory Visit: Payer: Self-pay

## 2019-03-08 MED ORDER — MULTAQ 400 MG PO TABS: 400.0000 mg | ORAL_TABLET | Freq: Two times a day (BID) | ORAL | 0 refills | Status: DC

## 2019-03-16 ENCOUNTER — Other Ambulatory Visit (HOSPITAL_COMMUNITY): Payer: Self-pay | Admitting: Internal Medicine

## 2019-03-16 ENCOUNTER — Ambulatory Visit: Payer: Medicare Other | Attending: Internal Medicine

## 2019-03-16 DIAGNOSIS — Z23 Encounter for immunization: Secondary | ICD-10-CM | POA: Diagnosis not present

## 2019-03-16 NOTE — Progress Notes (Signed)
   Covid-19 Vaccination Clinic  Name:  Chad Morrison    MRN: [id number] DOB: 1933-01-14  03/16/2019  [name] was observed post Covid-19 immunization for 15 minutes without incidence. He was provided with Vaccine Information Sheet and instruction to access the V-Safe system.   [name] was instructed to call 911 with any severe reactions post vaccine: Marland Kitchen Difficulty breathing  . Swelling of your face and throat  . A fast heartbeat  . A bad rash all over your body  . Dizziness and weakness    Immunizations Administered    Name Date Dose VIS Date Route   Pfizer COVID-19 Vaccine 03/16/2019 12:48 PM 0.3 mL 02/08/2019 Intramuscular   Manufacturer: [place]   Lot: [id number]   [place]: [id number]

## 2019-03-18 ENCOUNTER — Other Ambulatory Visit: Payer: Self-pay

## 2019-03-18 ENCOUNTER — Ambulatory Visit (HOSPITAL_COMMUNITY)
Admission: RE | Admit: 2019-03-18 | Discharge: 2019-03-18 | Disposition: A | Discharge status: Home / Self Care | Payer: Medicare Other | Source: Ambulatory Visit | Attending: Physician Assistant | Admitting: Physician Assistant

## 2019-03-18 VITALS — BP 130/66 | HR 82 | Ht 66.0 in | Wt 142.6 lb

## 2019-03-18 DIAGNOSIS — E785 Hyperlipidemia, unspecified: Secondary | ICD-10-CM | POA: Diagnosis not present

## 2019-03-18 DIAGNOSIS — Z79899 Other long term (current) drug therapy: Secondary | ICD-10-CM | POA: Diagnosis not present

## 2019-03-18 DIAGNOSIS — Z8546 Personal history of malignant neoplasm of prostate: Secondary | ICD-10-CM | POA: Insufficient documentation

## 2019-03-18 DIAGNOSIS — Z79890 Hormone replacement therapy (postmenopausal): Secondary | ICD-10-CM | POA: Diagnosis not present

## 2019-03-18 DIAGNOSIS — M3130 Wegener's granulomatosis without renal involvement: Secondary | ICD-10-CM | POA: Insufficient documentation

## 2019-03-18 DIAGNOSIS — I10 Essential (primary) hypertension: Secondary | ICD-10-CM | POA: Diagnosis not present

## 2019-03-18 DIAGNOSIS — Z8249 Family history of ischemic heart disease and other diseases of the circulatory system: Secondary | ICD-10-CM | POA: Insufficient documentation

## 2019-03-18 DIAGNOSIS — I4819 Other persistent atrial fibrillation: Secondary | ICD-10-CM | POA: Diagnosis not present

## 2019-03-18 DIAGNOSIS — D6869 Other thrombophilia: Secondary | ICD-10-CM | POA: Insufficient documentation

## 2019-03-18 DIAGNOSIS — Z882 Allergy status to sulfonamides status: Secondary | ICD-10-CM | POA: Insufficient documentation

## 2019-03-18 DIAGNOSIS — Z825 Family history of asthma and other chronic lower respiratory diseases: Secondary | ICD-10-CM | POA: Diagnosis not present

## 2019-03-18 DIAGNOSIS — J45909 Unspecified asthma, uncomplicated: Secondary | ICD-10-CM | POA: Insufficient documentation

## 2019-03-18 DIAGNOSIS — I483 Typical atrial flutter: Secondary | ICD-10-CM | POA: Diagnosis not present

## 2019-03-18 DIAGNOSIS — Z87891 Personal history of nicotine dependence: Secondary | ICD-10-CM | POA: Diagnosis not present

## 2019-03-18 DIAGNOSIS — Z91040 Latex allergy status: Secondary | ICD-10-CM | POA: Diagnosis not present

## 2019-03-18 DIAGNOSIS — Z833 Family history of diabetes mellitus: Secondary | ICD-10-CM | POA: Diagnosis not present

## 2019-03-18 DIAGNOSIS — K219 Gastro-esophageal reflux disease without esophagitis: Secondary | ICD-10-CM | POA: Diagnosis not present

## 2019-03-18 DIAGNOSIS — Z7901 Long term (current) use of anticoagulants: Secondary | ICD-10-CM | POA: Diagnosis not present

## 2019-03-18 DIAGNOSIS — Z7951 Long term (current) use of inhaled steroids: Secondary | ICD-10-CM | POA: Insufficient documentation

## 2019-03-18 DIAGNOSIS — Z823 Family history of stroke: Secondary | ICD-10-CM | POA: Insufficient documentation

## 2019-03-18 DIAGNOSIS — Z880 Allergy status to penicillin: Secondary | ICD-10-CM | POA: Diagnosis not present

## 2019-03-18 DIAGNOSIS — I4891 Unspecified atrial fibrillation: Secondary | ICD-10-CM | POA: Diagnosis present

## 2019-03-18 DIAGNOSIS — I350 Nonrheumatic aortic (valve) stenosis: Secondary | ICD-10-CM | POA: Diagnosis not present

## 2019-03-18 NOTE — Addendum Note (Signed)
Encounter addended by: Oliver Barre, PA on: 03/18/2019 1:30 PM  Actions taken: Clinical Note Signed

## 2019-03-18 NOTE — Progress Notes (Addendum)
Primary Care Physician: Tisovec, Fransico Him, MD Referring Physician: Dr. Rosalyn Charters [place] is a 86 y.o. male with a h/o typical atrial flutter, on amiodarone for evaluation in the afib clinic. He saw Dr. [name] in August and was c/o of shortness of breath. There was concern of amiodarone toxicity and pt asked to reduce amio to 100 mg a day. He had refused typical atrial flutter ablation in the past. He saw pulmonology and found to be in rapid atrial fibrillation. Dr. [name]  was notified  and he was asked to have f/u here. He increased his amio back to 200 mg. Pulmonology is planning to repeat PFT's when his heart rate is controlled. 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 Pulmonology  questioned if lungs were also involved contributing to increased shortness of breath. He saw Dr. [name], 10/10. Amiodarone was stopped and multaq was started. He is on Eliquis for a CHADS2VASC score of 3.   On follow up today, patient reports that he has done well sine his last visit. He does state that he has afib about 2 days per week, confirmed by his [place] device. His is in SR the rest of the time. When he is in afib, his symptoms range from asymptomatic to mild exercise intolerance. There are no triggers that he can identify.   Today, he denies symptoms of palpitations, chest pain, orthopnea, PND, lower extremity edema, dizziness, presyncope, syncope, or neurologic sequela. The patient is tolerating medications without difficulties and is otherwise without complaint today.  Past Medical History:  Diagnosis Date  . Anxiety   . Asthma   . Asthmatic bronchitis   . Atrial flutter (HCC)    chads2vasc score is 3  . Ehrlichiosis [id number]  . GERD (gastroesophageal reflux disease)   . 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 HTN (hypertension)   . Hyperlipidemia   . Paroxysmal atrial fibrillation (HCC)   . Prostate cancer ([place])   . Seasonal allergies    Past Surgical History:    Procedure Laterality Date  . BASAL CELL CARCINOMA EXCISION  2013   EYELID  . HERNIA REPAIR Bilateral 2000, 2002  . KNEE ARTHROSCOPY  2000  . prostectomy  1995   RADICAL  . TONSILLECTOMY  1940    Current Outpatient Medications  Medication Sig Dispense Refill  . albuterol (VENTOLIN HFA) 108 (90 Base) MCG/ACT inhaler USE 1 TO 2 INHALATIONS     ORALLY EVERY 6 HOURS AS    NEEDED FOR WHEEZING OR     SHORTNESS OF BREATH 54 g 3  . ALPRAZolam (XANAX) 0.25 MG tablet Take 0.25 mg by mouth at bedtime as needed for sleep.     . Ascorbic Acid (VITAMIN C PO) Take by mouth. Not sure the dosage    . Bromfenac Sodium (PROLENSA) 0.07 % SOLN Apply 1 drop to eye daily.    . Calcium Carbonate-Vitamin D (CALCIUM-VITAMIN D) 600-125 MG-UNIT TABS Take by mouth 2 (two) times daily.    Marland Kitchen diltiazem (CARDIZEM CD) 120 MG 24 hr capsule TAKE ONE CAPSULE BY MOUTH TWICE DAILY. DOSE INCREASE. 60 capsule 4  . diphenhydrAMINE (BENADRYL) 25 MG tablet Take 50 mg by mouth at bedtime.     . dronedarone (MULTAQ) 400 MG tablet Take 1 tablet (400 mg total) by mouth 2 (two) times daily with a meal. Pt needs to keep upcoming appt in Jan, 2065for further refills 60 tablet 0  . ELIQUIS 5 MG TABS tablet TAKE 1  TABLET TWICE A DAY 180 tablet 2  . FEROSUL 325 (65 Fe) MG tablet Take 325 mg by mouth daily.    . folic acid (FOLVITE) 1 MG tablet Take 1 mg by mouth daily.    Marland Kitchen levocetirizine (XYZAL) 5 MG tablet TAKE 1 TABLET EVERY EVENING 90 tablet 1  . levothyroxine (SYNTHROID, LEVOTHROID) 75 MCG tablet Take 75 mcg by mouth daily before breakfast.    . methotrexate (RHEUMATREX) 2.5 MG tablet Take by mouth. Taking 10 tablets once a week    . montelukast (SINGULAIR) 10 MG tablet Take 1 tablet (10 mg total) by mouth at bedtime. 30 tablet 0  . Multiple Vitamin (MULTIVITAMIN WITH MINERALS) TABS tablet Take 1 tablet daily by mouth.    . Multiple Vitamins-Minerals (OCUVITE PRESERVISION PO) Take 1 tablet 2 (two) times daily by mouth.    . Multiple  Vitamins-Minerals (ZINC PO) Take by mouth. Not sure the dosage    . NON FORMULARY 8 tabs weekly    . omeprazole (PRILOSEC) 40 MG capsule Take 40 mg daily by mouth.    . pantoprazole (PROTONIX) 40 MG tablet Take 40 mg by mouth daily.    . simvastatin (ZOCOR) 10 MG tablet Take 10 mg by mouth every evening.     . SYMBICORT 160-4.5 MCG/ACT inhaler USE 2 INHALATIONS ORALLY   TWICE DAILY 30.6 g 0   No current facility-administered medications for this encounter.    Allergies  Allergen Reactions  . Penicillins Hives    Has patient had a PCN reaction causing immediate rash, facial/tongue/throat swelling, SOB or lightheadedness with hypotension: No Has patient had a PCN reaction causing severe rash involving mucus membranes or skin necrosis: No Has patient had a PCN reaction that required hospitalization: No Has patient had a PCN reaction occurring within the last 10 years: Yes If all of the above answers are "NO", then may proceed with Cephalosporin use.   . Sulfa Antibiotics Other (See Comments)    Unknown to patient   . Latex Hives and Rash    Social History   Socioeconomic History  . Marital status: Married    Spouse name: [name]  . Number of children: 2  . Years of education: Not on file  . Highest education level: Not on file  Occupational History  . Occupation: RETIRED  Tobacco Use  . Smoking status: Former Smoker    Packs/day: 0.50    Years: 4.00    Pack years: 2.00    Types: Cigarettes    Quit date: 02/28/1961    Years since quitting: 58.0  . Smokeless tobacco: Never Used  Substance and Sexual Activity  . Alcohol use: Yes  . Drug use: No  . Sexual activity: Not on file  Other Topics Concern  . Not on file  Social History Narrative   Pt recently moved to [place] from [place] to be near his son.   Retired.   Social Determinants of Health   Financial Resource Strain:   . Difficulty of Paying Living Expenses: Not on file  Food Insecurity:   . Worried About  Charity fundraiser in the Last Year: Not on file  . Ran Out of Food in the Last Year: Not on file  Transportation Needs:   . Lack of Transportation (Medical): Not on file  . Lack of Transportation (Non-Medical): Not on file  Physical Activity:   . Days of Exercise per Week: Not on file  . Minutes of Exercise per Session: Not on file  Stress:   . Feeling of Stress : Not on file  Social Connections:   . Frequency of Communication with Friends and Family: Not on file  . Frequency of Social Gatherings with Friends and Family: Not on file  . Attends Religious Services: Not on file  . Active Member of Clubs or Organizations: Not on file  . Attends Archivist Meetings: Not on file  . Marital Status: Not on file  Intimate Partner Violence:   . Fear of Current or Ex-Partner: Not on file  . Emotionally Abused: Not on file  . Physically Abused: Not on file  . Sexually Abused: Not on file    Family History  Problem Relation Age of Onset  . Asthma Mother 53  . Allergies Mother   . Ulcers Mother   . CVA Father 60  . Heart Problems Father   . Other Father        NEUROLOGIC ISSUES   . Other Brother 11       BICYCLE ACCIDENT  . Migraines Son   . Diabetes Mellitus I Son     ROS- All systems are reviewed and negative except as per the HPI above  Physical Exam: Vitals:   03/18/19 1121  BP: 130/66  Pulse: 82  SpO2: 98%  Weight: 64.7 kg  Height: 5\' 6"  (1.676 m)   Wt Readings from Last 3 Encounters:  03/18/19 64.7 kg  11/15/18 66.1 kg  08/23/18 67.6 kg    Labs: Lab Results  Component Value Date   NA 138 01/18/2018   K 4.3 01/18/2018   CL 109 01/18/2018   CO2 20 (L) 01/18/2018   GLUCOSE 132 (H) 01/18/2018   BUN 30 (H) 01/18/2018   CREATININE 1.38 (H) 01/18/2018   CALCIUM 9.5 01/18/2018   No results found for: INR No results found for: CHOL, HDL, LDLCALC, TRIG   GEN- The patient is well appearing elderly male, alert and oriented x 3 today.   HEENT-head  normocephalic, atraumatic, sclera clear, conjunctiva pink, hearing intact, trachea midline. Lungs- Clear to ausculation bilaterally, normal work of breathing Heart- irregular rate and rhythm, no murmurs, rubs or gallops  GI- soft, NT, ND, + BS Extremities- no clubbing, cyanosis, or edema MS- no significant deformity or atrophy Skin- no rash or lesion Psych- euthymic mood, full affect Neuro- strength and sensation are intact   EKG- afib HR 82, RBBB, QRS 148, QTc 476  Epic records reviewed  Echo 10/23/17 - Left ventricle: The cavity size was normal. There was mild   concentric hypertrophy. Systolic function was normal. The   estimated ejection fraction was in the range of 60% to 65%. Wall   motion was normal; there were no regional wall motion   abnormalities. Features are consistent with a pseudonormal left   ventricular filling pattern, with concomitant abnormal relaxation   and increased filling pressure (grade 2 diastolic dysfunction).   Doppler parameters are consistent with indeterminate ventricular   filling pressure. - Aortic valve: Valve mobility was restricted. There was moderate   stenosis. - Aorta: Ascending aortic diameter: 37 mm (S). - Ascending aorta: The ascending aorta was mildly dilated. - Mitral valve: Transvalvular velocity was within the normal range.   There was no evidence for stenosis. There was mild regurgitation. - Left atrium: The atrium was severely dilated. - Right ventricle: The cavity size was normal. Wall thickness was   normal. Systolic function was normal. - Right atrium: The atrium was mildly dilated. - Atrial septum: No defect  or patent foramen ovale was identified   by color flow Doppler. - Tricuspid valve: There was mild regurgitation. - Pulmonary arteries: Systolic pressure was within the normal   range. PA peak pressure: 35 mm Hg (S).   Assessment and Plan: 1. Persistent atrial fibrillation/typical atrial flutter Amiodarone stopped 2/2  lung disease. Patient does have frequent episodes of afib but he is mostly asymptomatic. Patient happy with present therapy.  If he should become persistent, Multaq would be contraindicated, would consider changing AAD vs DCCV. K[place]ia device for monitoring.  Continue cardizem 120 mg bid  Continue Multaq 400 mg BID Continue Eliquis 5 mg BID Request recent labs from PCP.  This patients CHA2DS2-VASc Score and unadjusted Ischemic Stroke Rate (% per year) is equal to 3.2 % stroke rate/year from a score of 3  Above score calculated as 1 point each if present [CHF, HTN, DM, Vascular=MI/PAD/Aortic Plaque, Age if 65-74, or Male] Above score calculated as 2 points each if present [Age > 75, or Stroke/TIA/TE]   2. Granulomatous vasculitis Plans per pulmonary.   3. Aortic Stenosis Moderate by echo 12/20/18.   Follow up in the AF clinic in 6 months.    Addendum: Labs from PCP 01/14/19 Cr 1.1, BUN 15, K+ 4.5, AST 20, ALT 13 Hgb 9.3, Hct 31.3, PLT 250   Adline Peals PA-C [facility] [address] [place], [place] [address] [phone]

## 2019-03-19 DIAGNOSIS — H59031 Cystoid macular edema following cataract surgery, right eye: Secondary | ICD-10-CM | POA: Diagnosis not present

## 2019-03-19 DIAGNOSIS — H2640 Unspecified secondary cataract: Secondary | ICD-10-CM | POA: Diagnosis not present

## 2019-03-19 DIAGNOSIS — H353211 Exudative age-related macular degeneration, right eye, with active choroidal neovascularization: Secondary | ICD-10-CM | POA: Diagnosis not present

## 2019-03-19 DIAGNOSIS — H35371 Puckering of macula, right eye: Secondary | ICD-10-CM | POA: Diagnosis not present

## 2019-03-21 DIAGNOSIS — Z85828 Personal history of other malignant neoplasm of skin: Secondary | ICD-10-CM | POA: Diagnosis not present

## 2019-03-21 DIAGNOSIS — L905 Scar conditions and fibrosis of skin: Secondary | ICD-10-CM | POA: Diagnosis not present

## 2019-03-21 DIAGNOSIS — D485 Neoplasm of uncertain behavior of skin: Secondary | ICD-10-CM | POA: Diagnosis not present

## 2019-04-01 DIAGNOSIS — K120 Recurrent oral aphthae: Secondary | ICD-10-CM | POA: Diagnosis not present

## 2019-04-02 DIAGNOSIS — C44612 Basal cell carcinoma of skin of right upper limb, including shoulder: Secondary | ICD-10-CM | POA: Diagnosis not present

## 2019-04-02 DIAGNOSIS — D485 Neoplasm of uncertain behavior of skin: Secondary | ICD-10-CM | POA: Diagnosis not present

## 2019-04-04 ENCOUNTER — Ambulatory Visit: Payer: Medicare Other | Attending: Internal Medicine

## 2019-04-04 DIAGNOSIS — Z23 Encounter for immunization: Secondary | ICD-10-CM | POA: Insufficient documentation

## 2019-04-04 NOTE — Progress Notes (Signed)
   Covid-19 Vaccination Clinic  Name:  Chad Morrison    MRN: [id number] DOB: 07/21/1932  04/04/2019  Chad Morrison was observed post Covid-19 immunization for 15 minutes without incidence. He was provided with Vaccine Information Sheet and instruction to access the V-Safe system.   Chad Morrison was instructed to call 911 with any severe reactions post vaccine: Marland Kitchen Difficulty breathing  . Swelling of your face and throat  . A fast heartbeat  . A bad rash all over your body  . Dizziness and weakness    Immunizations Administered    Name Date Dose VIS Date Route   Pfizer COVID-19 Vaccine 04/04/2019  1:41 PM 0.3 mL 02/08/2019 Intramuscular   Manufacturer: [place]   Lot: [id number]   Chad Morrison: [id number]

## 2019-04-09 ENCOUNTER — Ambulatory Visit: Payer: Medicare Other | Admitting: Pulmonary Disease

## 2019-04-09 ENCOUNTER — Encounter: Payer: Self-pay | Admitting: Adult Health

## 2019-04-09 ENCOUNTER — Other Ambulatory Visit: Payer: Self-pay

## 2019-04-09 ENCOUNTER — Ambulatory Visit (INDEPENDENT_AMBULATORY_CARE_PROVIDER_SITE_OTHER): Payer: Medicare Other | Admitting: Adult Health

## 2019-04-09 DIAGNOSIS — J310 Chronic rhinitis: Secondary | ICD-10-CM | POA: Diagnosis not present

## 2019-04-09 DIAGNOSIS — J4540 Moderate persistent asthma, uncomplicated: Secondary | ICD-10-CM | POA: Diagnosis not present

## 2019-04-09 DIAGNOSIS — R918 Other nonspecific abnormal finding of lung field: Secondary | ICD-10-CM

## 2019-04-09 NOTE — Assessment & Plan Note (Signed)
Patient complains of chronic symptoms.  May change Xyzal to [place]  Patient Instructions  Continue on Symbicort 2 puffs Twice daily  ,rinse after use.  Continue on Singulair daily  Can try Allegra 180mg  daily in place of Xyzal   Continue on Saline nasal rinses Twice daily   CT chest in March to follow up on lung nodules Continue follow up with Rheumatology at [facility] .  Follow up with Dr. Vaughan Browner in 6 months and As needed   Please contact office for sooner follow up if symptoms do not improve or worsen or seek emergency care

## 2019-04-09 NOTE — Progress Notes (Signed)
@Patient  ID: Chad Morrison, male    DOB: 1932/06/06, 86 y.o.   MRN: [id number]  Chief Complaint  Patient presents with  . 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 fib on Multaq (previously on amiodarone)   TEST/EVENTS :   CT chest in March of this year showed stable scattered lung nodules measuring max up to 5 mm.  We discussed a follow-up CT chest in March 2021  CT chest March 2020 small scattered nonspecific lung nodules stable since October 2019 measure up to 5 mm  High-resolution CT chest October 2019 basilar predominant subpleural groundglass and mild reticulations may be due to nonspecific interstitial pneumonitis, indeterminate for UIP, Multiple pulmonary nodules less than 6 mm   March 2019 ratio 65%, FEV1 2.44 L 88% predicted, FVC 3.78 L 96% predicted, total lung capacity 7.41 L 104% predicted, residual volume 152% predict, DLCO 22.8 70% predicted  04/09/2019 Follow up ; Asthma  Patient presents for a 76-month follow-up.  Patient has chronic obstructive asthma and allergic rhinitis.  Remains on Symbicort and Xyzal.  Says overall breathing is doing okay with no flare cough or wheezing.  No increased albuterol use. Went walking in woods yesterday , did notice stuffy nose this morning. Uses recumbent bike and stairs for exercise.   Is followed by rheumatology for ANCA associated vasculitis.  He is currently on methotrexate.  He is monitored at [facility].  Patient has known scattered lung nodules on CT.  March 2020 CT showed stable nodules.  Patient has upcoming CT follow-up next month.  Patient denies any hemoptysis or unintentional weight loss  Has received both Covid 19 vaccines  Allergies    Allergen Reactions  . Penicillins Hives    Has patient had a PCN reaction causing immediate rash, facial/tongue/throat swelling, SOB or lightheadedness with hypotension: No Has patient had a PCN reaction causing severe rash involving mucus membranes or skin necrosis: No Has patient had a PCN reaction that required hospitalization: No Has patient had a PCN reaction occurring within the last 10 years: Yes If all of the above answers are "NO", then may proceed with Cephalosporin use.   . Sulfa Antibiotics Other (See Comments)    Unknown to patient   . Latex Hives and Rash    Immunization History  Administered Date(s) Administered  . Influenza, High Dose Seasonal PF 12/27/2016, 12/18/2017, 11/29/2018  . Influenza-Unspecified 12/22/2016  . PFIZER SARS-COV-2 Vaccination 03/16/2019, 04/04/2019  . Pneumococcal Conjugate-13 01/04/2016  . Pneumococcal Polysaccharide-23 01/03/2014  . Pneumococcal-Unspecified 10/31/2016    Past Medical History:  Diagnosis Date  . Anxiety   . Asthma   . Asthmatic bronchitis   . Atrial flutter (HCC)    chads2vasc score is 3  . Ehrlichiosis [id number]  . GERD (gastroesophageal reflux disease)   . Granulomatosis with polyangiitis ([place])   . HTN (hypertension)   . Hyperlipidemia   . Paroxysmal atrial fibrillation (HCC)   . Prostate cancer ([place])   . Seasonal allergies     Tobacco History: Social History   Tobacco Use  Smoking Status Former Smoker  . Packs/day: 0.50  . Years: 4.00  . Pack years: 2.00  . Types: Cigarettes  . Quit date: 02/28/1961  . Years since quitting: 58.1  Smokeless Tobacco  Never Used   Counseling given: Not Answered   Outpatient Medications Prior to Visit  Medication Sig Dispense Refill  . albuterol (VENTOLIN HFA) 108 (90 Base) MCG/ACT inhaler USE 1 TO 2 INHALATIONS     ORALLY EVERY 6 HOURS AS    NEEDED FOR WHEEZING OR     SHORTNESS OF BREATH 54 g 3  . ALPRAZolam (XANAX) 0.25 MG tablet Take 0.25 mg by mouth at bedtime as needed  for sleep.     . Ascorbic Acid (VITAMIN C PO) Take by mouth. Not sure the dosage    . Bromfenac Sodium (PROLENSA) 0.07 % SOLN Apply 1 drop to eye daily.    . Calcium Carbonate-Vitamin D (CALCIUM-VITAMIN D) 600-125 MG-UNIT TABS Take by mouth 2 (two) times daily.    Marland Kitchen diltiazem (CARDIZEM CD) 120 MG 24 hr capsule Take 1 capsule (120 mg total) by mouth 2 (two) times daily. Please make annual appt with Dr. [name] for refills. [phone]. 1st attempt. 60 capsule 0  . diphenhydrAMINE (BENADRYL) 25 MG tablet Take 50 mg by mouth at bedtime as needed.     . dronedarone (MULTAQ) 400 MG tablet Take 1 tablet (400 mg total) by mouth 2 (two) times daily with a meal. Pt needs to keep upcoming appt in Jan, 2023for further refills 60 tablet 0  . ELIQUIS 5 MG TABS tablet TAKE 1 TABLET TWICE A DAY 180 tablet 2  . FEROSUL 325 (65 Fe) MG tablet Take 325 mg by mouth daily.    . folic acid (FOLVITE) 1 MG tablet Take 2 mg by mouth daily.     Marland Kitchen levocetirizine (XYZAL) 5 MG tablet TAKE 1 TABLET EVERY EVENING 90 tablet 1  . levothyroxine (SYNTHROID, LEVOTHROID) 75 MCG tablet Take 75 mcg by mouth daily before breakfast.    . methotrexate (RHEUMATREX) 2.5 MG tablet Take by mouth. Taking 10 tablets once a week    . montelukast (SINGULAIR) 10 MG tablet Take 1 tablet (10 mg total) by mouth at bedtime. 30 tablet 0  . Multiple Vitamin (MULTIVITAMIN WITH MINERALS) TABS tablet Take 1 tablet daily by mouth.    . Multiple Vitamins-Minerals (OCUVITE PRESERVISION PO) Take 1 tablet 2 (two) times daily by mouth.    . Multiple Vitamins-Minerals (ZINC PO) Take by mouth. Not sure the dosage    . NON FORMULARY 8 tabs weekly    . omeprazole (PRILOSEC) 40 MG capsule Take 40 mg daily by mouth.    . pantoprazole (PROTONIX) 40 MG tablet Take 40 mg by mouth daily.    . simvastatin (ZOCOR) 10 MG tablet Take 10 mg by mouth every evening.     . SYMBICORT 160-4.5 MCG/ACT inhaler USE 2 INHALATIONS ORALLY   TWICE DAILY 30.6 g 0   No  facility-administered medications prior to visit.     Review of Systems:   Constitutional:   No  weight loss, night sweats,  Fevers, chills, + fatigue, or  lassitude.  HEENT:   No headaches,  Difficulty swallowing,  Tooth/dental problems, or  Sore throat,                No sneezing, itching, ear ache,  +nasal congestion, post nasal drip,   CV:  No chest pain,  Orthopnea, PND, swelling in lower extremities, anasarca, dizziness, palpitations, syncope.   GI  No heartburn, indigestion, abdominal pain, nausea, vomiting, diarrhea, change in bowel habits, loss of appetite, bloody stools.   Resp  No excess mucus, no productive cough,  No non-productive cough,  No coughing up of blood.  No change in color of mucus.  No wheezing.  No chest wall deformity  Skin: no rash or lesions.  GU: no dysuria, change in color of urine, no urgency or frequency.  No flank pain, no hematuria   MS:  No joint pain or swelling.  No decreased range of motion.  No back pain.    Physical Exam  BP 114/60 (BP Location: Left Arm, Cuff Size: Normal)   Pulse 95   Temp (!) 97 F (36.1 C) (Temporal)   Ht 5\' 9"  (1.753 m)   Wt 145 lb 3.2 oz (65.9 kg)   SpO2 98% Comment: RA  BMI 21.44 kg/m   GEN: A/Ox3; pleasant , NAD, thin and elderly    HEENT:  [place]/AT,    NOSE-clear, THROAT-clear, no lesions, no postnasal drip or exudate noted.   NECK:  Supple w/ fair ROM; no JVD; normal carotid impulses w/o bruits; no thyromegaly or nodules palpated; no lymphadenopathy.    RESP  Clear  P & A; w/o, wheezes/ rales/ or rhonchi. no accessory muscle use, no dullness to percussion  CARD:  RRR, no m/r/g, no peripheral edema, pulses intact, no cyanosis or clubbing.  GI:   Soft & nt; nml bowel sounds; no organomegaly or masses detected.   Musco: Warm bil, no deformities or joint swelling noted.   Neuro: alert, no focal deficits noted.    Skin: Warm, no lesions or rashes    Lab Results:  BNP No results found for:  BNP  ProBNP No results found for: PROBNP  Imaging: No results found.    PFT Results Latest Ref Rng & Units 05/24/2017  FVC-Pre L 3.40  FVC-Predicted Pre % 87  FVC-Post L 3.78  FVC-Predicted Post % 96  Pre FEV1/FVC % % 65  Post FEV1/FCV % % 65  FEV1-Pre L 2.21  FEV1-Predicted Pre % 80  FEV1-Post L 2.44  DLCO UNC% % 70  DLCO COR %Predicted % 66  TLC L 7.41  TLC % Predicted % 104  RV % Predicted % 78    No results found for: NITRICOXIDE      Assessment & Plan:   Asthma Currently stable on present regimen  Plan  Patient Instructions  Continue on Symbicort 2 puffs Twice daily  ,rinse after use.  Continue on Singulair daily  Can try Allegra 180mg  daily in place of Xyzal   Continue on Saline nasal rinses Twice daily   CT chest in March to follow up on lung nodules Continue follow up with Rheumatology at [facility] .  Follow up with Dr. Vaughan Browner in 6 months and As needed   Please contact office for sooner follow up if symptoms do not improve or worsen or seek emergency care        Chronic rhinitis Patient complains of chronic symptoms.  May change Xyzal to [place]  Patient Instructions  Continue on Symbicort 2 puffs Twice daily  ,rinse after use.  Continue on Singulair daily  Can try Allegra 180mg  daily in place of Xyzal   Continue on Saline nasal rinses Twice daily   CT chest in March to follow up on lung nodules Continue follow up with Rheumatology at [facility] .  Follow up with Dr. Vaughan Browner in 6 months and As needed   Please contact office for sooner follow up if symptoms do not improve or worsen or seek emergency care           Rexene Edison, NP 04/09/2019

## 2019-04-09 NOTE — Assessment & Plan Note (Signed)
Stable on CT chest 2020 , repeat CT chest next month.

## 2019-04-09 NOTE — Patient Instructions (Addendum)
Continue on Symbicort 2 puffs Twice daily  ,rinse after use.  Continue on Singulair daily  Can try Allegra 180mg  daily in place of Xyzal   Continue on Saline nasal rinses Twice daily   CT chest in March to follow up on lung nodules Continue follow up with Rheumatology at [facility] .  Follow up with Dr. Vaughan Browner in 6 months and As needed   Please contact office for sooner follow up if symptoms do not improve or worsen or seek emergency care

## 2019-04-09 NOTE — Assessment & Plan Note (Signed)
Currently stable on present regimen  Plan  Patient Instructions  Continue on Symbicort 2 puffs Twice daily  ,rinse after use.  Continue on Singulair daily  Can try Allegra 180mg  daily in place of Xyzal   Continue on Saline nasal rinses Twice daily   CT chest in March to follow up on lung nodules Continue follow up with Rheumatology at [facility] .  Follow up with Dr. Vaughan Browner in 6 months and As needed   Please contact office for sooner follow up if symptoms do not improve or worsen or seek emergency care

## 2019-04-09 NOTE — Addendum Note (Signed)
Addended by: Parke Poisson E on: 04/09/2019 03:23 PM   Modules accepted: Orders

## 2019-04-14 ENCOUNTER — Other Ambulatory Visit (HOSPITAL_COMMUNITY): Payer: Self-pay | Admitting: Internal Medicine

## 2019-04-15 ENCOUNTER — Other Ambulatory Visit: Payer: Self-pay | Admitting: Internal Medicine

## 2019-04-15 ENCOUNTER — Other Ambulatory Visit: Payer: Self-pay | Admitting: Pulmonary Disease

## 2019-04-15 ENCOUNTER — Other Ambulatory Visit: Payer: Self-pay | Admitting: Adult Health

## 2019-04-15 MED ORDER — APIXABAN 5 MG PO TABS: 5.0000 mg | ORAL_TABLET | Freq: Two times a day (BID) | ORAL | 1 refills | Status: DC

## 2019-04-15 NOTE — Telephone Encounter (Signed)
°*  STAT* If patient is at the pharmacy, call can be transferred to refill team.   1. Which medications need to be refilled? (please list name of each medication and dose if known) ELIQUIS 5 MG TABS tablet  2. Which pharmacy/location (including street and city if local pharmacy) is medication to be sent to? [facility] [place], [place] AT Portal to Registered [facility] Sites  3. Do they need a 30 day or 90 day supply? 90 day

## 2019-04-15 NOTE — Telephone Encounter (Signed)
Pt last saw Clint Fenton, PA 03/18/19, last labs 01/14/19 Creat 1.1, age 86, weight 65.9kg, based on specified criteria pt is on appropriate dosage of Eliquis 5mg  BID.  Will refill rx.

## 2019-04-16 NOTE — Telephone Encounter (Signed)
This is a A-Fib clinic pt 

## 2019-04-22 ENCOUNTER — Other Ambulatory Visit (HOSPITAL_COMMUNITY): Payer: Self-pay | Admitting: *Deleted

## 2019-04-22 MED ORDER — DILTIAZEM HCL ER COATED BEADS 120 MG PO CP24: 120.0000 mg | ORAL_CAPSULE | Freq: Two times a day (BID) | ORAL | 3 refills | Status: DC

## 2019-04-23 DIAGNOSIS — D1801 Hemangioma of skin and subcutaneous tissue: Secondary | ICD-10-CM | POA: Diagnosis not present

## 2019-04-23 DIAGNOSIS — D225 Melanocytic nevi of trunk: Secondary | ICD-10-CM | POA: Diagnosis not present

## 2019-04-23 DIAGNOSIS — C44729 Squamous cell carcinoma of skin of left lower limb, including hip: Secondary | ICD-10-CM | POA: Diagnosis not present

## 2019-04-23 DIAGNOSIS — C44719 Basal cell carcinoma of skin of left lower limb, including hip: Secondary | ICD-10-CM | POA: Diagnosis not present

## 2019-04-23 DIAGNOSIS — C4441 Basal cell carcinoma of skin of scalp and neck: Secondary | ICD-10-CM | POA: Diagnosis not present

## 2019-04-23 DIAGNOSIS — Z85820 Personal history of malignant melanoma of skin: Secondary | ICD-10-CM | POA: Diagnosis not present

## 2019-04-23 DIAGNOSIS — D485 Neoplasm of uncertain behavior of skin: Secondary | ICD-10-CM | POA: Diagnosis not present

## 2019-04-23 DIAGNOSIS — C44319 Basal cell carcinoma of skin of other parts of face: Secondary | ICD-10-CM | POA: Diagnosis not present

## 2019-04-23 DIAGNOSIS — C44519 Basal cell carcinoma of skin of other part of trunk: Secondary | ICD-10-CM | POA: Diagnosis not present

## 2019-04-23 DIAGNOSIS — L905 Scar conditions and fibrosis of skin: Secondary | ICD-10-CM | POA: Diagnosis not present

## 2019-04-25 DIAGNOSIS — H2640 Unspecified secondary cataract: Secondary | ICD-10-CM | POA: Diagnosis not present

## 2019-04-25 DIAGNOSIS — H59031 Cystoid macular edema following cataract surgery, right eye: Secondary | ICD-10-CM | POA: Diagnosis not present

## 2019-04-25 DIAGNOSIS — H353211 Exudative age-related macular degeneration, right eye, with active choroidal neovascularization: Secondary | ICD-10-CM | POA: Diagnosis not present

## 2019-04-25 DIAGNOSIS — H35371 Puckering of macula, right eye: Secondary | ICD-10-CM | POA: Diagnosis not present

## 2019-04-28 ENCOUNTER — Other Ambulatory Visit (HOSPITAL_COMMUNITY): Payer: Self-pay | Admitting: Physician Assistant

## 2019-05-03 ENCOUNTER — Other Ambulatory Visit (HOSPITAL_COMMUNITY): Payer: Self-pay | Admitting: *Deleted

## 2019-05-03 MED ORDER — DILTIAZEM HCL ER COATED BEADS 120 MG PO CP24: 120.0000 mg | ORAL_CAPSULE | Freq: Two times a day (BID) | ORAL | 3 refills | Status: DC

## 2019-05-06 ENCOUNTER — Other Ambulatory Visit: Payer: Self-pay

## 2019-05-06 ENCOUNTER — Ambulatory Visit
Admission: RE | Admit: 2019-05-06 | Discharge: 2019-05-06 | Disposition: A | Discharge status: Home / Self Care | Payer: Medicare Other | Source: Ambulatory Visit | Attending: Adult Health | Admitting: Adult Health

## 2019-05-06 DIAGNOSIS — R918 Other nonspecific abnormal finding of lung field: Secondary | ICD-10-CM

## 2019-05-13 DIAGNOSIS — C44519 Basal cell carcinoma of skin of other part of trunk: Secondary | ICD-10-CM | POA: Diagnosis not present

## 2019-05-13 DIAGNOSIS — C4441 Basal cell carcinoma of skin of scalp and neck: Secondary | ICD-10-CM | POA: Diagnosis not present

## 2019-05-29 DIAGNOSIS — C44729 Squamous cell carcinoma of skin of left lower limb, including hip: Secondary | ICD-10-CM | POA: Diagnosis not present

## 2019-06-04 ENCOUNTER — Ambulatory Visit (INDEPENDENT_AMBULATORY_CARE_PROVIDER_SITE_OTHER): Payer: Medicare Other | Admitting: Ophthalmology

## 2019-06-04 ENCOUNTER — Other Ambulatory Visit: Payer: Self-pay

## 2019-06-04 ENCOUNTER — Encounter (INDEPENDENT_AMBULATORY_CARE_PROVIDER_SITE_OTHER): Payer: Self-pay | Admitting: Ophthalmology

## 2019-06-04 DIAGNOSIS — H353211 Exudative age-related macular degeneration, right eye, with active choroidal neovascularization: Secondary | ICD-10-CM | POA: Insufficient documentation

## 2019-06-04 DIAGNOSIS — M3130 Wegener's granulomatosis without renal involvement: Secondary | ICD-10-CM

## 2019-06-04 DIAGNOSIS — H2512 Age-related nuclear cataract, left eye: Secondary | ICD-10-CM

## 2019-06-04 DIAGNOSIS — H35371 Puckering of macula, right eye: Secondary | ICD-10-CM

## 2019-06-04 DIAGNOSIS — H353212 Exudative age-related macular degeneration, right eye, with inactive choroidal neovascularization: Secondary | ICD-10-CM | POA: Insufficient documentation

## 2019-06-04 DIAGNOSIS — H59039 Cystoid macular edema following cataract surgery, unspecified eye: Secondary | ICD-10-CM | POA: Diagnosis not present

## 2019-06-04 HISTORY — DX: Age-related nuclear cataract, left eye: H25.12

## 2019-06-04 MED ORDER — RANIBIZUMAB 0.5 MG/0.05ML IZ SOLN FOR KALEIDOSCOPE
0.5000 mg | INTRAVITREAL | Status: AC | PRN
  Administered 2019-06-04: 13:00:00 .5 mg via INTRAVITREAL

## 2019-06-12 DIAGNOSIS — C44319 Basal cell carcinoma of skin of other parts of face: Secondary | ICD-10-CM | POA: Diagnosis not present

## 2019-06-24 ENCOUNTER — Telehealth: Payer: Self-pay | Admitting: Adult Health

## 2019-06-24 MED ORDER — BUDESONIDE-FORMOTEROL FUMARATE 160-4.5 MCG/ACT IN AERO: 2.0000 | INHALATION_SPRAY | Freq: Two times a day (BID) | RESPIRATORY_TRACT | 3 refills | Status: DC

## 2019-06-24 NOTE — Telephone Encounter (Signed)
rx for pt's symbicort inhaler has been sent to pt's mail order pharmacy. Called and spoke with pt's wife Benjamine Mola letting her know this had been done and she verbalized understanding. Nothing further needed.

## 2019-06-28 DIAGNOSIS — C4359 Malignant melanoma of other part of trunk: Secondary | ICD-10-CM | POA: Diagnosis not present

## 2019-06-28 DIAGNOSIS — L239 Allergic contact dermatitis, unspecified cause: Secondary | ICD-10-CM | POA: Diagnosis not present

## 2019-06-28 DIAGNOSIS — L905 Scar conditions and fibrosis of skin: Secondary | ICD-10-CM | POA: Diagnosis not present

## 2019-07-03 DIAGNOSIS — R946 Abnormal results of thyroid function studies: Secondary | ICD-10-CM | POA: Diagnosis not present

## 2019-07-03 DIAGNOSIS — M859 Disorder of bone density and structure, unspecified: Secondary | ICD-10-CM | POA: Diagnosis not present

## 2019-07-03 DIAGNOSIS — E7800 Pure hypercholesterolemia, unspecified: Secondary | ICD-10-CM | POA: Diagnosis not present

## 2019-07-03 DIAGNOSIS — Z125 Encounter for screening for malignant neoplasm of prostate: Secondary | ICD-10-CM | POA: Diagnosis not present

## 2019-07-09 ENCOUNTER — Encounter (INDEPENDENT_AMBULATORY_CARE_PROVIDER_SITE_OTHER): Payer: Self-pay | Admitting: Ophthalmology

## 2019-07-09 ENCOUNTER — Other Ambulatory Visit: Payer: Self-pay

## 2019-07-09 ENCOUNTER — Ambulatory Visit (INDEPENDENT_AMBULATORY_CARE_PROVIDER_SITE_OTHER): Payer: Medicare Other | Admitting: Ophthalmology

## 2019-07-09 DIAGNOSIS — H4301 Vitreous prolapse, right eye: Secondary | ICD-10-CM

## 2019-07-09 DIAGNOSIS — H35351 Cystoid macular degeneration, right eye: Secondary | ICD-10-CM | POA: Diagnosis not present

## 2019-07-09 DIAGNOSIS — H353211 Exudative age-related macular degeneration, right eye, with active choroidal neovascularization: Secondary | ICD-10-CM

## 2019-07-09 MED ORDER — RANIBIZUMAB 0.5 MG/0.05ML IZ SOLN FOR KALEIDOSCOPE
0.5000 mg | INTRAVITREAL | Status: AC | PRN
  Administered 2019-07-09: .5 mg via INTRAVITREAL

## 2019-07-09 NOTE — Progress Notes (Signed)
07/09/2019     CHIEF COMPLAINT Patient presents for Retina Follow Up   HISTORY OF PRESENT ILLNESS: Chad Morrison is a 86 y.o. 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  Severity is moderate.  Duration of 5 weeks.  Since onset it is stable.  I, the attending physician,  performed the HPI with the patient and updated documentation appropriately.          Comments    5 Week AMD f\u OD. Possible Lucentis OD. OCT  Pt states vision is stable. Denies any complaints.       Last edited by Tilda Franco on 07/09/2019 10:51 AM. (History)      Referring physician: Haywood Pao, MD [place],  [place] [address]  HISTORICAL INFORMATION:   Selected notes from the MEDICAL RECORD NUMBER       CURRENT MEDICATIONS: Current Outpatient Medications (Ophthalmic Drugs)  Medication Sig  . Bromfenac Sodium (PROLENSA) 0.07 % SOLN Apply 1 drop to eye daily.   No current facility-administered medications for this visit. (Ophthalmic Drugs)   Current Outpatient Medications (Other)  Medication Sig  . albuterol (VENTOLIN HFA) 108 (90 Base) MCG/ACT inhaler USE 1 TO 2 INHALATIONS     ORALLY EVERY 6 HOURS AS    NEEDED FOR WHEEZING OR     SHORTNESS OF BREATH  . ALPRAZolam (XANAX) 0.25 MG tablet Take 0.25 mg by mouth at bedtime as needed for sleep.   Marland Kitchen apixaban (ELIQUIS) 5 MG TABS tablet Take 1 tablet (5 mg total) by mouth 2 (two) times daily.  . Ascorbic Acid (VITAMIN C PO) Take by mouth. Not sure the dosage  . budesonide-formoterol (SYMBICORT) 160-4.5 MCG/ACT inhaler Inhale 2 puffs into the lungs in the morning and at bedtime.  . Calcium Carbonate-Vitamin D (CALCIUM-VITAMIN D) 600-125 MG-UNIT TABS Take by mouth 2 (two) times daily.  Marland Kitchen diltiazem (CARDIZEM CD) 120 MG 24 hr capsule Take 1 capsule (120 mg total) by mouth 2 (two) times daily.  . diphenhydrAMINE (BENADRYL) 25 MG tablet Take 50 mg by mouth at  bedtime as needed.   . FEROSUL 325 (65 Fe) MG tablet Take 325 mg by mouth daily.  . folic acid (FOLVITE) 1 MG tablet Take 2 mg by mouth daily.   Marland Kitchen levocetirizine (XYZAL) 5 MG tablet TAKE 1 TABLET EVERY EVENING  . levothyroxine (SYNTHROID, LEVOTHROID) 75 MCG tablet Take 75 mcg by mouth daily before breakfast.  . methotrexate (RHEUMATREX) 2.5 MG tablet Take by mouth. Taking 10 tablets once a week  . montelukast (SINGULAIR) 10 MG tablet Take 1 tablet (10 mg total) by mouth at bedtime.  . MULTAQ 400 MG tablet TAKE 1 TABLET TWICE DAILY  WITH MEALS  . Multiple Vitamin (MULTIVITAMIN WITH MINERALS) TABS tablet Take 1 tablet daily by mouth.  . Multiple Vitamins-Minerals (OCUVITE PRESERVISION PO) Take 1 tablet 2 (two) times daily by mouth.  . Multiple Vitamins-Minerals (ZINC PO) Take by mouth. Not sure the dosage  . mupirocin ointment (BACTROBAN) 2 % Apply 1 application topically 2 (two) times daily as needed.  . NON FORMULARY 8 tabs weekly  . omeprazole (PRILOSEC) 40 MG capsule Take 40 mg daily by mouth.  . pantoprazole (PROTONIX) 40 MG tablet Take 40 mg by mouth daily.  . simvastatin (ZOCOR) 10 MG tablet Take 10 mg by mouth every evening.   . triamcinolone (KENALOG)  0.1 % paste APPLY A SMALL AMOUNT TO MOUTH ULCER THREE TIMES DAILY FOR 1 WEEK   No current facility-administered medications for this visit. (Other)      REVIEW OF SYSTEMS:    ALLERGIES Allergies  Allergen Reactions  . Penicillins Hives    Has patient had a PCN reaction causing immediate rash, facial/tongue/throat swelling, SOB or lightheadedness with hypotension: No Has patient had a PCN reaction causing severe rash involving mucus membranes or skin necrosis: No Has patient had a PCN reaction that required hospitalization: No Has patient had a PCN reaction occurring within the last 10 years: Yes If all of the above answers are "NO", then may proceed with Cephalosporin use.   . Sulfa Antibiotics Other (See Comments)     Unknown to patient   . Latex Hives and Rash    PAST MEDICAL HISTORY Past Medical History:  Diagnosis Date  . Anxiety   . Asthma   . Asthmatic bronchitis   . Atrial flutter (HCC)    chads2vasc score is 3  . Ehrlichiosis [id number]  . GERD (gastroesophageal reflux disease)   . Granulomatosis with polyangiitis ([place])   . HTN (hypertension)   . Hyperlipidemia   . Paroxysmal atrial fibrillation (HCC)   . Prostate cancer ([place])   . Seasonal allergies    Past Surgical History:  Procedure Laterality Date  . BASAL CELL CARCINOMA EXCISION  2013   EYELID  . HERNIA REPAIR Bilateral 2000, 2002  . KNEE ARTHROSCOPY  2000  . prostectomy  1995   RADICAL  . TONSILLECTOMY  1940    FAMILY HISTORY Family History  Problem Relation Age of Onset  . Asthma Mother 49  . Allergies Mother   . Ulcers Mother   . CVA Father 37  . Heart Problems Father   . Other Father        NEUROLOGIC ISSUES   . Other Brother 11       BICYCLE ACCIDENT  . Migraines Son   . Diabetes Mellitus I Son     SOCIAL HISTORY Social History   Tobacco Use  . Smoking status: Former Smoker    Packs/day: 0.50    Years: 4.00    Pack years: 2.00    Types: Cigarettes    Quit date: 02/28/1961    Years since quitting: 58.3  . Smokeless tobacco: Never Used  Substance Use Topics  . Alcohol use: Yes  . Drug use: No         OPHTHALMIC EXAM:  Base Eye Exam    Visual Acuity (Snellen - Linear)      Right Left   Dist [place] 20/60 20/25 -2   Dist ph  20/50 -1        Tonometry (Tonopen, 10:58 AM)      Right Left   Pressure 6 9       Pupils      Pupils Dark Light Shape React APD   Right PERRL 5 5 Irregular Slow None   Left PERRL 4 3.5 Round Slow None       Visual Fields (Counting fingers)      Left Right    Full Full       Neuro/Psych    Oriented x3: Yes   Mood/Affect: Normal       Dilation    Right eye: 1.0% Mydriacyl, 2.5% Phenylephrine @ 10:58 AM        Slit Lamp and Fundus Exam    External Exam  Right Left   External Normal Normal       Slit Lamp Exam      Right Left   Lids/Lashes Normal Normal   Conjunctiva/Sclera White and quiet White and quiet   Cornea Clear Clear   Anterior Chamber Deep and quiet, vit prolapse around iol,,  Deep and quiet   Iris Round and reactive Round and reactive   Lens Posterior chamber intraocular lens Clear   Anterior Vitreous Normal Normal       Fundus Exam      Right Left   Posterior Vitreous Normal Normal   Disc Normal    C/D Ratio 0.5    Macula Cystoid macular edema    Vessels Normal    Periphery Normal           IMAGING AND PROCEDURES  Imaging and Procedures for 07/09/19  OCT, Retina - OU - Both Eyes       Right Eye Quality was good. Scan locations included subfoveal. Central Foveal Thickness: 474. Findings include cystoid macular edema, epiretinal membrane.   Left Eye Quality was good. Scan locations included subfoveal. Central Foveal Thickness: 349.   Notes Persistent CME OD with subfoveal nodule.  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 12:03 PM. Confirmed correct patient, procedure, site, and patient consented.   Anesthesia Topical anesthesia was used. Anesthetic medications included [place] 3.5%.   Procedure Preparation included Ofloxacin , Tobramycin 0.3%, 10% betadine to eyelids. A 30 gauge needle was used.   Injection:  0.5 mg ranibizumab (LUCENTIS) 0.5 MG/0.05ML intravitreal injection   NDC: 50242-080-01, Lot: [id number]   Route: Intravitreal, Site: Right Eye, Waste: 0 mg  Post-op Post injection exam found visual acuity of at least counting fingers. The patient tolerated the procedure well. There were no complications. The patient received written and verbal post procedure  care education. Post injection medications were not given.                 ASSESSMENT/PLAN:  No problem-specific Assessment & Plan notes found for this encounter.      ICD-10-CM   1. Exudative age-related macular degeneration of right eye with active choroidal neovascularization (HCC)  H35.3211 OCT, Retina - OU - Both Eyes    Intravitreal Injection, Pharmacologic Agent - OD - Right Eye    ranibizumab (LUCENTIS) 0.5 MG/0.05ML intravitreal injection 0.5 mg  2. Vitreous prolapse of right eye  H43.01   3. Cystoid macular edema of right eye  H35.351     1.  Intravitreal Lucentis OD today  2.  YAG vitreal lysis OD planned soon in order to diminish the risk of ongoing iris chafe potentially contributing to CME  3.  Ophthalmic Meds Ordered this visit:  Meds ordered this encounter  Medications  . ranibizumab (LUCENTIS) 0.5 MG/0.05ML intravitreal injection 0.5 mg       Return in about 2 weeks (around 07/23/2019), or No dilation, YAG laser vitreal lysis OD, for NO DILATE.  There are no Patient Instructions on file for this visit.   Explained the diagnoses, plan, and follow up with the patient and they expressed understanding.  Patient expressed understanding of the importance of proper follow up care.   Clent Demark Calle Schader M.D.  Diseases & Surgery of the Retina and Vitreous Retina & Diabetic [place] 07/09/19     Abbreviations: M myopia (nearsighted); A astigmatism; H hyperopia (farsighted); P presbyopia; Mrx spectacle prescription;  CTL contact lenses; OD right eye; OS left eye; OU both eyes  XT exotropia; ET esotropia; PEK punctate epithelial keratitis; PEE punctate epithelial erosions; DES dry eye syndrome; MGD meibomian gland dysfunction; ATs artificial tears; PFAT's preservative free artificial tears; [place] nuclear sclerotic cataract; PSC posterior subcapsular cataract; ERM epi-retinal membrane; PVD posterior vitreous detachment; RD retinal detachment; DM diabetes mellitus; DR  diabetic retinopathy; NPDR non-proliferative diabetic retinopathy; PDR proliferative diabetic retinopathy; CSME clinically significant macular edema; DME diabetic macular edema; dbh dot blot hemorrhages; CWS cotton wool spot; POAG primary open angle glaucoma; C/D cup-to-disc ratio; HVF humphrey visual field; GVF goldmann visual field; OCT optical coherence tomography; IOP intraocular pressure; BRVO Branch retinal vein occlusion; CRVO central retinal vein occlusion; CRAO central retinal artery occlusion; BRAO branch retinal artery occlusion; RT retinal tear; SB scleral buckle; PPV pars plana vitrectomy; VH Vitreous hemorrhage; PRP panretinal laser photocoagulation; IVK intravitreal kenalog; VMT vitreomacular traction; MH Macular hole;  NVD neovascularization of the disc; NVE neovascularization elsewhere; AREDS age related eye disease study; ARMD age related macular degeneration; POAG primary open angle glaucoma; EBMD epithelial/anterior basement membrane dystrophy; ACIOL anterior chamber intraocular lens; IOL intraocular lens; PCIOL posterior chamber intraocular lens; Phaco/IOL phacoemulsification with intraocular lens placement; [place] photorefractive keratectomy; LASIK laser assisted in situ keratomileusis; HTN hypertension; DM diabetes mellitus; COPD chronic obstructive pulmonary disease

## 2019-07-10 DIAGNOSIS — Z8546 Personal history of malignant neoplasm of prostate: Secondary | ICD-10-CM | POA: Diagnosis not present

## 2019-07-10 DIAGNOSIS — J302 Other seasonal allergic rhinitis: Secondary | ICD-10-CM | POA: Diagnosis not present

## 2019-07-10 DIAGNOSIS — Z1331 Encounter for screening for depression: Secondary | ICD-10-CM | POA: Diagnosis not present

## 2019-07-10 DIAGNOSIS — Z0000 Encounter for general adult medical examination without abnormal findings: Secondary | ICD-10-CM | POA: Diagnosis not present

## 2019-07-10 DIAGNOSIS — D8989 Other specified disorders involving the immune mechanism, not elsewhere classified: Secondary | ICD-10-CM | POA: Diagnosis not present

## 2019-07-10 DIAGNOSIS — J849 Interstitial pulmonary disease, unspecified: Secondary | ICD-10-CM | POA: Diagnosis not present

## 2019-07-10 DIAGNOSIS — R600 Localized edema: Secondary | ICD-10-CM | POA: Diagnosis not present

## 2019-07-10 DIAGNOSIS — M3130 Wegener's granulomatosis without renal involvement: Secondary | ICD-10-CM | POA: Diagnosis not present

## 2019-07-10 DIAGNOSIS — I4892 Unspecified atrial flutter: Secondary | ICD-10-CM | POA: Diagnosis not present

## 2019-07-10 DIAGNOSIS — J4540 Moderate persistent asthma, uncomplicated: Secondary | ICD-10-CM | POA: Diagnosis not present

## 2019-07-10 DIAGNOSIS — Z7901 Long term (current) use of anticoagulants: Secondary | ICD-10-CM | POA: Diagnosis not present

## 2019-07-10 DIAGNOSIS — Z1339 Encounter for screening examination for other mental health and behavioral disorders: Secondary | ICD-10-CM | POA: Diagnosis not present

## 2019-07-10 DIAGNOSIS — D649 Anemia, unspecified: Secondary | ICD-10-CM | POA: Diagnosis not present

## 2019-07-10 DIAGNOSIS — E7800 Pure hypercholesterolemia, unspecified: Secondary | ICD-10-CM | POA: Diagnosis not present

## 2019-07-11 ENCOUNTER — Telehealth: Payer: Self-pay | Admitting: Adult Health

## 2019-07-11 MED ORDER — LEVOCETIRIZINE DIHYDROCHLORIDE 5 MG PO TABS: 5.0000 mg | ORAL_TABLET | Freq: Every evening | ORAL | 3 refills | Status: DC

## 2019-07-11 NOTE — Telephone Encounter (Signed)
Pt's wife requesting refill on Levoceterizine.  This has been refilled as requested.  Nothing further needed at this time- will close encounter.

## 2019-07-18 DIAGNOSIS — M3130 Wegener's granulomatosis without renal involvement: Secondary | ICD-10-CM | POA: Diagnosis not present

## 2019-07-18 DIAGNOSIS — Z79899 Other long term (current) drug therapy: Secondary | ICD-10-CM | POA: Diagnosis not present

## 2019-07-18 DIAGNOSIS — M8949 Other hypertrophic osteoarthropathy, multiple sites: Secondary | ICD-10-CM | POA: Diagnosis not present

## 2019-07-23 ENCOUNTER — Other Ambulatory Visit: Payer: Self-pay

## 2019-07-23 ENCOUNTER — Ambulatory Visit (INDEPENDENT_AMBULATORY_CARE_PROVIDER_SITE_OTHER): Payer: Medicare Other | Admitting: Ophthalmology

## 2019-07-23 ENCOUNTER — Encounter (INDEPENDENT_AMBULATORY_CARE_PROVIDER_SITE_OTHER): Payer: Self-pay | Admitting: Ophthalmology

## 2019-07-23 DIAGNOSIS — H43311 Vitreous membranes and strands, right eye: Secondary | ICD-10-CM | POA: Diagnosis not present

## 2019-07-23 DIAGNOSIS — H59039 Cystoid macular edema following cataract surgery, unspecified eye: Secondary | ICD-10-CM

## 2019-07-23 DIAGNOSIS — R82998 Other abnormal findings in urine: Secondary | ICD-10-CM | POA: Diagnosis not present

## 2019-07-23 DIAGNOSIS — H4301 Vitreous prolapse, right eye: Secondary | ICD-10-CM | POA: Diagnosis not present

## 2019-07-23 MED ORDER — PREDNISOLONE ACETATE 1 % OP SUSP: 1.0000 [drp] | Freq: Four times a day (QID) | OPHTHALMIC | 0 refills | Status: AC

## 2019-07-23 NOTE — Assessment & Plan Note (Signed)
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  Vitreal lysis and photo disruption for vitreous prolapse performed today atraumatically

## 2019-07-23 NOTE — Progress Notes (Addendum)
07/23/2019     CHIEF COMPLAINT Patient presents for Retina Follow Up   HISTORY OF PRESENT ILLNESS: Chad Morrison is a 86 y.o. male who presents to the clinic today for:   HPI    Retina Follow Up    Patient presents with  Other.  In right eye.  Duration of 2 weeks.  Since onset it is stable.          Comments    2 week follow up - Possible YAG laser vitreal lysis OD Patient denies change in vision and overall has no complaints.        Last edited by Gerda Diss on 07/23/2019  1:20 PM. (History)      Referring physician: Haywood Pao, MD [place],   [id number]  HISTORICAL INFORMATION:   Selected notes from the MEDICAL RECORD NUMBER       CURRENT MEDICATIONS: Current Outpatient Medications (Ophthalmic Drugs)  Medication Sig  . Bromfenac Sodium (PROLENSA) 0.07 % SOLN Apply 1 drop to eye daily.  . prednisoLONE acetate (PRED FORTE) 1 % ophthalmic suspension Place 1 drop into the right eye 4 (four) times daily for 14 days.   No current facility-administered medications for this visit. (Ophthalmic Drugs)   Current Outpatient Medications (Other)  Medication Sig  . albuterol (VENTOLIN HFA) 108 (90 Base) MCG/ACT inhaler USE 1 TO 2 INHALATIONS     ORALLY EVERY 6 HOURS AS    NEEDED FOR WHEEZING OR     SHORTNESS OF BREATH  . ALPRAZolam (XANAX) 0.25 MG tablet Take 0.25 mg by mouth at bedtime as needed for sleep.   Marland Kitchen apixaban (ELIQUIS) 5 MG TABS tablet Take 1 tablet (5 mg total) by mouth 2 (two) times daily.  . Ascorbic Acid (VITAMIN C PO) Take by mouth. Not sure the dosage  . budesonide-formoterol (SYMBICORT) 160-4.5 MCG/ACT inhaler Inhale 2 puffs into the lungs in the morning and at bedtime.  . Calcium Carbonate-Vitamin D (CALCIUM-VITAMIN D) 600-125 MG-UNIT TABS Take by mouth 2 (two) times daily.  Marland Kitchen diltiazem (CARDIZEM CD) 120 MG 24 hr capsule Take 1 capsule (120 mg total) by mouth 2 (two) times daily.  . diphenhydrAMINE (BENADRYL) 25 MG tablet Take 50  mg by mouth at bedtime as needed.   . FEROSUL 325 (65 Fe) MG tablet Take 325 mg by mouth daily.  . folic acid (FOLVITE) 1 MG tablet Take 2 mg by mouth daily.   Marland Kitchen levocetirizine (XYZAL) 5 MG tablet Take 1 tablet (5 mg total) by mouth every evening.  Marland Kitchen levothyroxine (SYNTHROID, LEVOTHROID) 75 MCG tablet Take 75 mcg by mouth daily before breakfast.  . methotrexate (RHEUMATREX) 2.5 MG tablet Take by mouth. Taking 10 tablets once a week  . montelukast (SINGULAIR) 10 MG tablet Take 1 tablet (10 mg total) by mouth at bedtime.  . MULTAQ 400 MG tablet TAKE 1 TABLET TWICE DAILY  WITH MEALS  . Multiple Vitamin (MULTIVITAMIN WITH MINERALS) TABS tablet Take 1 tablet daily by mouth.  . Multiple Vitamins-Minerals (OCUVITE PRESERVISION PO) Take 1 tablet 2 (two) times daily by mouth.  . Multiple Vitamins-Minerals (ZINC PO) Take by mouth. Not sure the dosage  . mupirocin ointment (BACTROBAN) 2 % Apply 1 application topically 2 (two) times daily as needed.  . NON FORMULARY 8 tabs weekly  . omeprazole (PRILOSEC) 40 MG capsule Take 40 mg daily by mouth.  . pantoprazole (PROTONIX) 40 MG tablet Take 40 mg by mouth daily.  . simvastatin (ZOCOR) 10 MG tablet  Take 10 mg by mouth every evening.   . triamcinolone (KENALOG) 0.1 % paste APPLY A SMALL AMOUNT TO MOUTH ULCER THREE TIMES DAILY FOR 1 WEEK   No current facility-administered medications for this visit. (Other)      REVIEW OF SYSTEMS:    ALLERGIES Allergies  Allergen Reactions  . Penicillins Hives    Has patient had a PCN reaction causing immediate rash, facial/tongue/throat swelling, SOB or lightheadedness with hypotension: No Has patient had a PCN reaction causing severe rash involving mucus membranes or skin necrosis: No Has patient had a PCN reaction that required hospitalization: No Has patient had a PCN reaction occurring within the last 10 years: Yes If all of the above answers are "NO", then may proceed with Cephalosporin use.   . Sulfa  Antibiotics Other (See Comments)    Unknown to patient   . Latex Hives and Rash    PAST MEDICAL HISTORY Past Medical History:  Diagnosis Date  . Anxiety   . Asthma   . Asthmatic bronchitis   . Atrial flutter (HCC)    chads2vasc score is 3  . Ehrlichiosis [id number]  . GERD (gastroesophageal reflux disease)   . Granulomatosis with polyangiitis ([place])   . HTN (hypertension)   . Hyperlipidemia   . Paroxysmal atrial fibrillation (HCC)   . Prostate cancer ([place])   . Seasonal allergies    Past Surgical History:  Procedure Laterality Date  . BASAL CELL CARCINOMA EXCISION  2013   EYELID  . HERNIA REPAIR Bilateral 2000, 2002  . KNEE ARTHROSCOPY  2000  . prostectomy  1995   RADICAL  . TONSILLECTOMY  1940    FAMILY HISTORY Family History  Problem Relation Age of Onset  . Asthma Mother 50  . Allergies Mother   . Ulcers Mother   . CVA Father 62  . Heart Problems Father   . Other Father        NEUROLOGIC ISSUES   . Other Brother 11       BICYCLE ACCIDENT  . Migraines Son   . Diabetes Mellitus I Son     SOCIAL HISTORY Social History   Tobacco Use  . Smoking status: Former Smoker    Packs/day: 0.50    Years: 4.00    Pack years: 2.00    Types: Cigarettes    Quit date: 02/28/1961    Years since quitting: 58.4  . Smokeless tobacco: Never Used  Substance Use Topics  . Alcohol use: Yes  . Drug use: No         OPHTHALMIC EXAM:  Base Eye Exam    Visual Acuity (Snellen - Linear)      Right Left   Dist [place] 20/60 20/30+2   Dist ph [place] 20/50-1 NI       Tonometry (Tonopen, 1:27 PM)      Right Left   Pressure 11 12       Pupils      Dark Light Shape React APD   Right 5 5 Irregular Slow None   Left 4 3 Round Slow None       Visual Fields (Counting fingers)      Left Right    Full Full       Extraocular Movement      Right Left    Full Full       Neuro/Psych    Oriented x3: Yes   Mood/Affect: Normal        Slit Lamp and Fundus Exam  External Exam       Right Left   External Normal Normal       Slit Lamp Exam      Right Left   Lids/Lashes Normal Normal   Conjunctiva/Sclera White and quiet White and quiet   Cornea Clear Clear   Anterior Chamber Deep and quiet Deep and quiet   Iris Round and reactive Round and reactive   Lens Posterior chamber intraocular lens    Vitreous Normal Normal          IMAGING AND PROCEDURES  Imaging and Procedures for 07/23/19  Laser Vitreolysis - OD - Right Eye       Time Out Confirmed correct patient, procedure, site, and patient consented.   Anesthesia Topical anesthesia was used. Anesthesia medications included Proparacaine.   Laser Information The type of laser was yag. The energy was 2.5 mj. Total energy was 56 mj.   Post-op The patient tolerated the procedure well. There were no complications. The patient received written and verbal post procedure care education.   Notes Prior to laser disruption of vitreous strand to the corneal wound, the pupil was peaked at the 730 meridian.  Immediately post procedure the pupil was nearly round.  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 Vitreous prolapse, right  H43.01 Laser Vitreolysis - OD - Right Eye  3. Vitreous strand, right  H43.311     1.  We will asked the patient to continue bromfenac once daily  2.  We will start Pred forte 1 drop OD 4 times daily for 14 days  3.  Patient return for follow-up as scheduled on the right eye for evaluation of macula possible injection of antivegF  medication  Ophthalmic Meds Ordered this visit:  Meds ordered this encounter  Medications  . prednisoLONE acetate (PRED FORTE) 1 % ophthalmic suspension    Sig: Place 1 drop into the right eye 4 (four) times daily for 14 days.    Dispense:  10 mL    Refill:  0       Return RV as scheduled for macular exam.  There are no Patient Instructions on file for this visit.   Explained the diagnoses, plan, and follow up with the patient and they expressed understanding.  Patient expressed understanding of the importance of proper follow up care.   Clent Demark Damareon Lanni M.D. Diseases & Surgery of the Retina and Vitreous Retina & Diabetic [place] 07/23/19     Abbreviations: M myopia (nearsighted); A astigmatism; H hyperopia (farsighted); P presbyopia; Mrx spectacle prescription;  CTL contact lenses; OD right eye; OS left eye; OU both eyes  XT exotropia; ET esotropia; PEK punctate epithelial keratitis; PEE punctate epithelial erosions; DES dry eye syndrome; MGD meibomian gland dysfunction; ATs artificial tears; PFAT's preservative free artificial tears; [place] nuclear sclerotic cataract; PSC posterior subcapsular cataract; ERM epi-retinal membrane; PVD posterior vitreous detachment; RD retinal detachment; DM diabetes mellitus; DR diabetic retinopathy; NPDR non-proliferative diabetic retinopathy; PDR proliferative diabetic retinopathy; CSME  clinically significant macular edema; DME diabetic macular edema; dbh dot blot hemorrhages; CWS cotton wool spot; POAG primary open angle glaucoma; C/D cup-to-disc ratio; HVF humphrey visual field; GVF goldmann visual field; OCT optical coherence tomography; IOP intraocular pressure; BRVO Branch retinal vein occlusion; CRVO central retinal vein occlusion; CRAO central retinal artery occlusion; BRAO branch retinal artery occlusion; RT retinal tear; SB scleral buckle; PPV pars plana vitrectomy; VH Vitreous hemorrhage; PRP panretinal laser photocoagulation; IVK intravitreal  kenalog; VMT vitreomacular traction; MH Macular hole;  NVD neovascularization of the disc; NVE neovascularization elsewhere; AREDS age related eye disease study; ARMD age related macular degeneration; POAG primary open angle glaucoma; EBMD epithelial/anterior basement membrane dystrophy; ACIOL anterior chamber intraocular lens; IOL intraocular lens; PCIOL posterior chamber intraocular lens; Phaco/IOL phacoemulsification with intraocular lens placement; [place] photorefractive keratectomy; LASIK laser assisted in situ keratomileusis; HTN hypertension; DM diabetes mellitus; COPD chronic obstructive pulmonary disease

## 2019-07-24 DIAGNOSIS — C44719 Basal cell carcinoma of skin of left lower limb, including hip: Secondary | ICD-10-CM | POA: Diagnosis not present

## 2019-07-30 DIAGNOSIS — E032 Hypothyroidism due to medicaments and other exogenous substances: Secondary | ICD-10-CM | POA: Diagnosis not present

## 2019-07-30 DIAGNOSIS — T462X5D Adverse effect of other antidysrhythmic drugs, subsequent encounter: Secondary | ICD-10-CM | POA: Diagnosis not present

## 2019-08-03 IMAGING — DX DG CHEST 2V
2 series · 2 of 2 positions shown · non-contrast
Comparison: PA and lateral chest x-ray July 21, 2017

CLINICAL DATA: Several day history of shortness of breath and
dyspnea. Elevated D-dimer. History of atrial fibrillation. Currently
experiencing bilateral lower extremity edema. History of COPD,
former smoker.

EXAM:
CHEST - 2 VIEW

[chest pa]
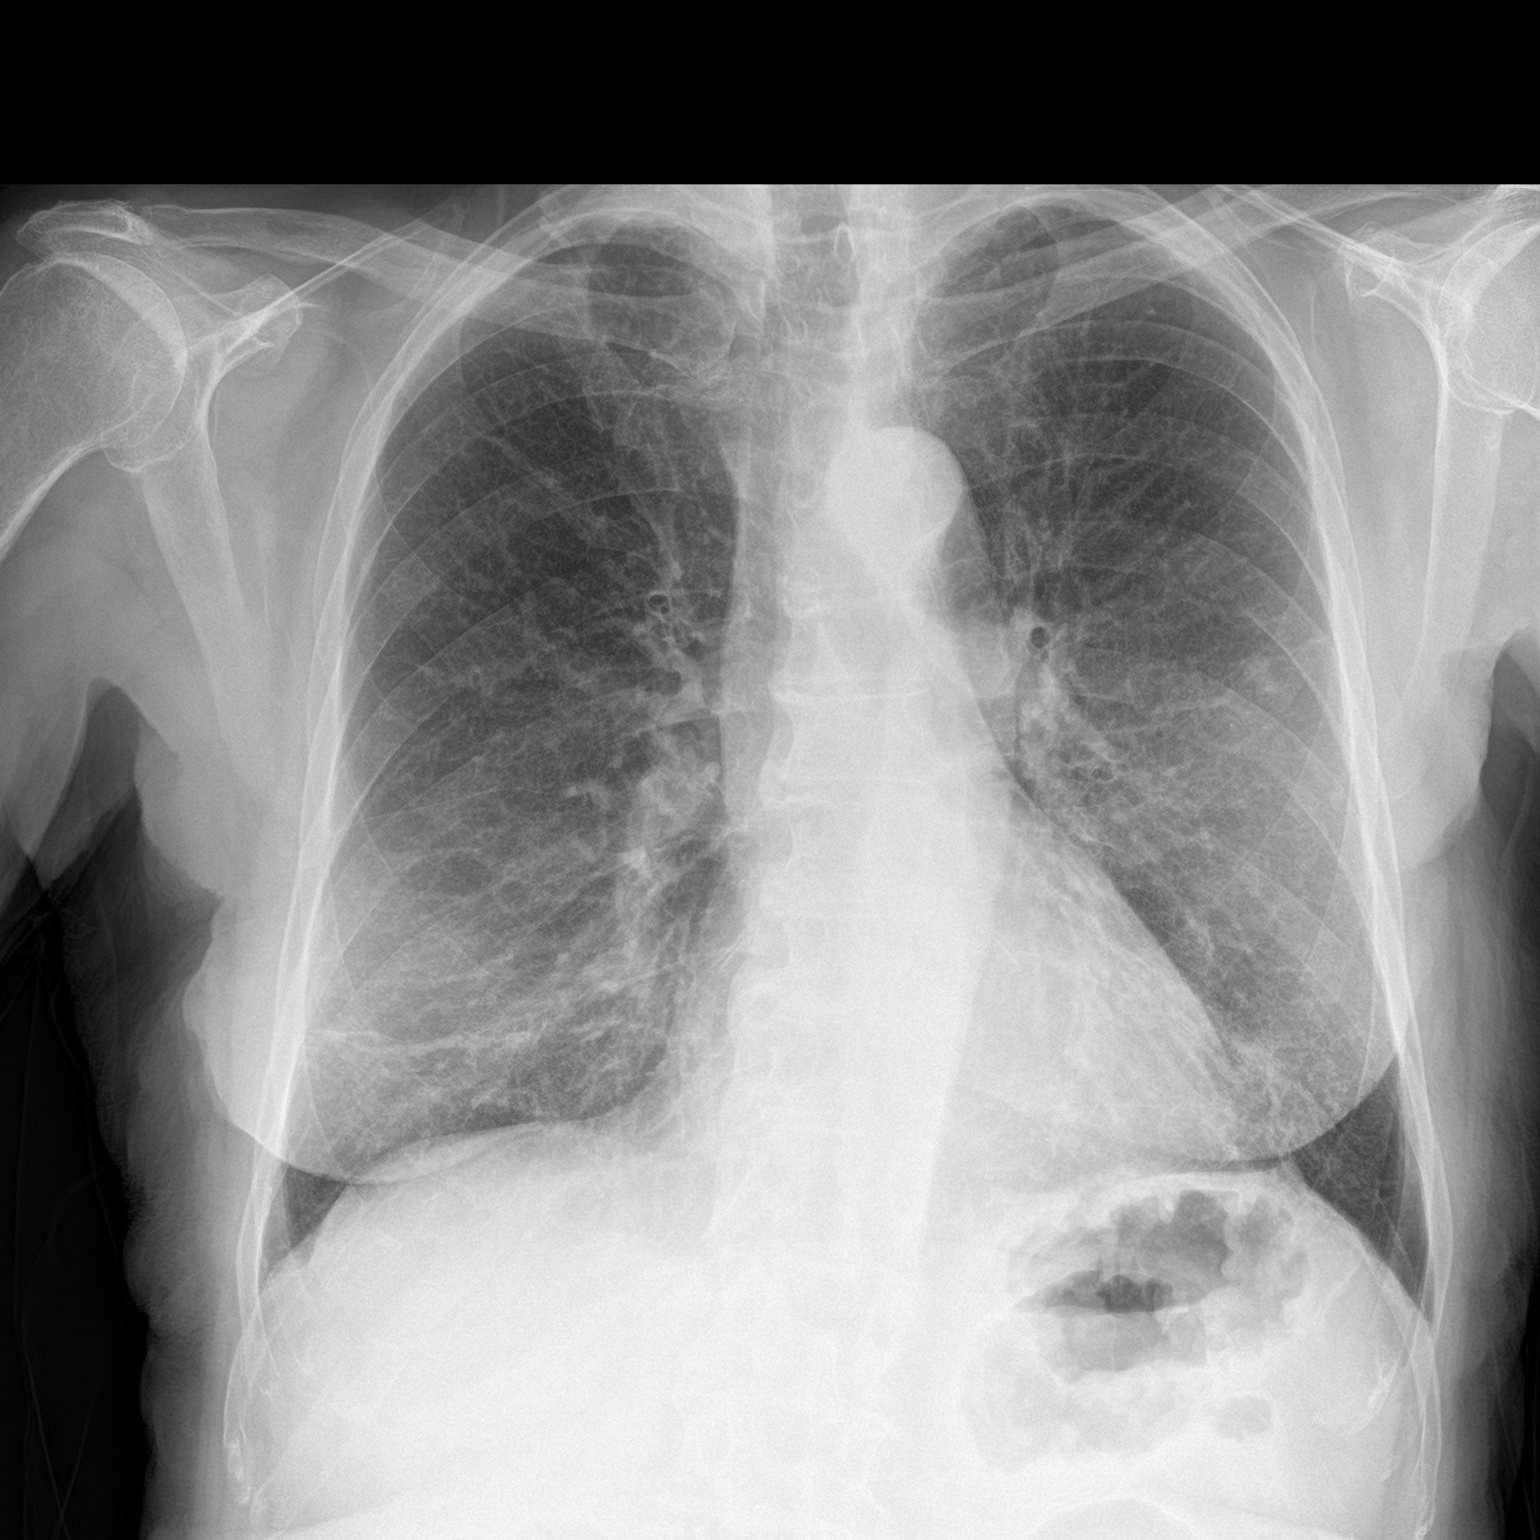

[chest lat]
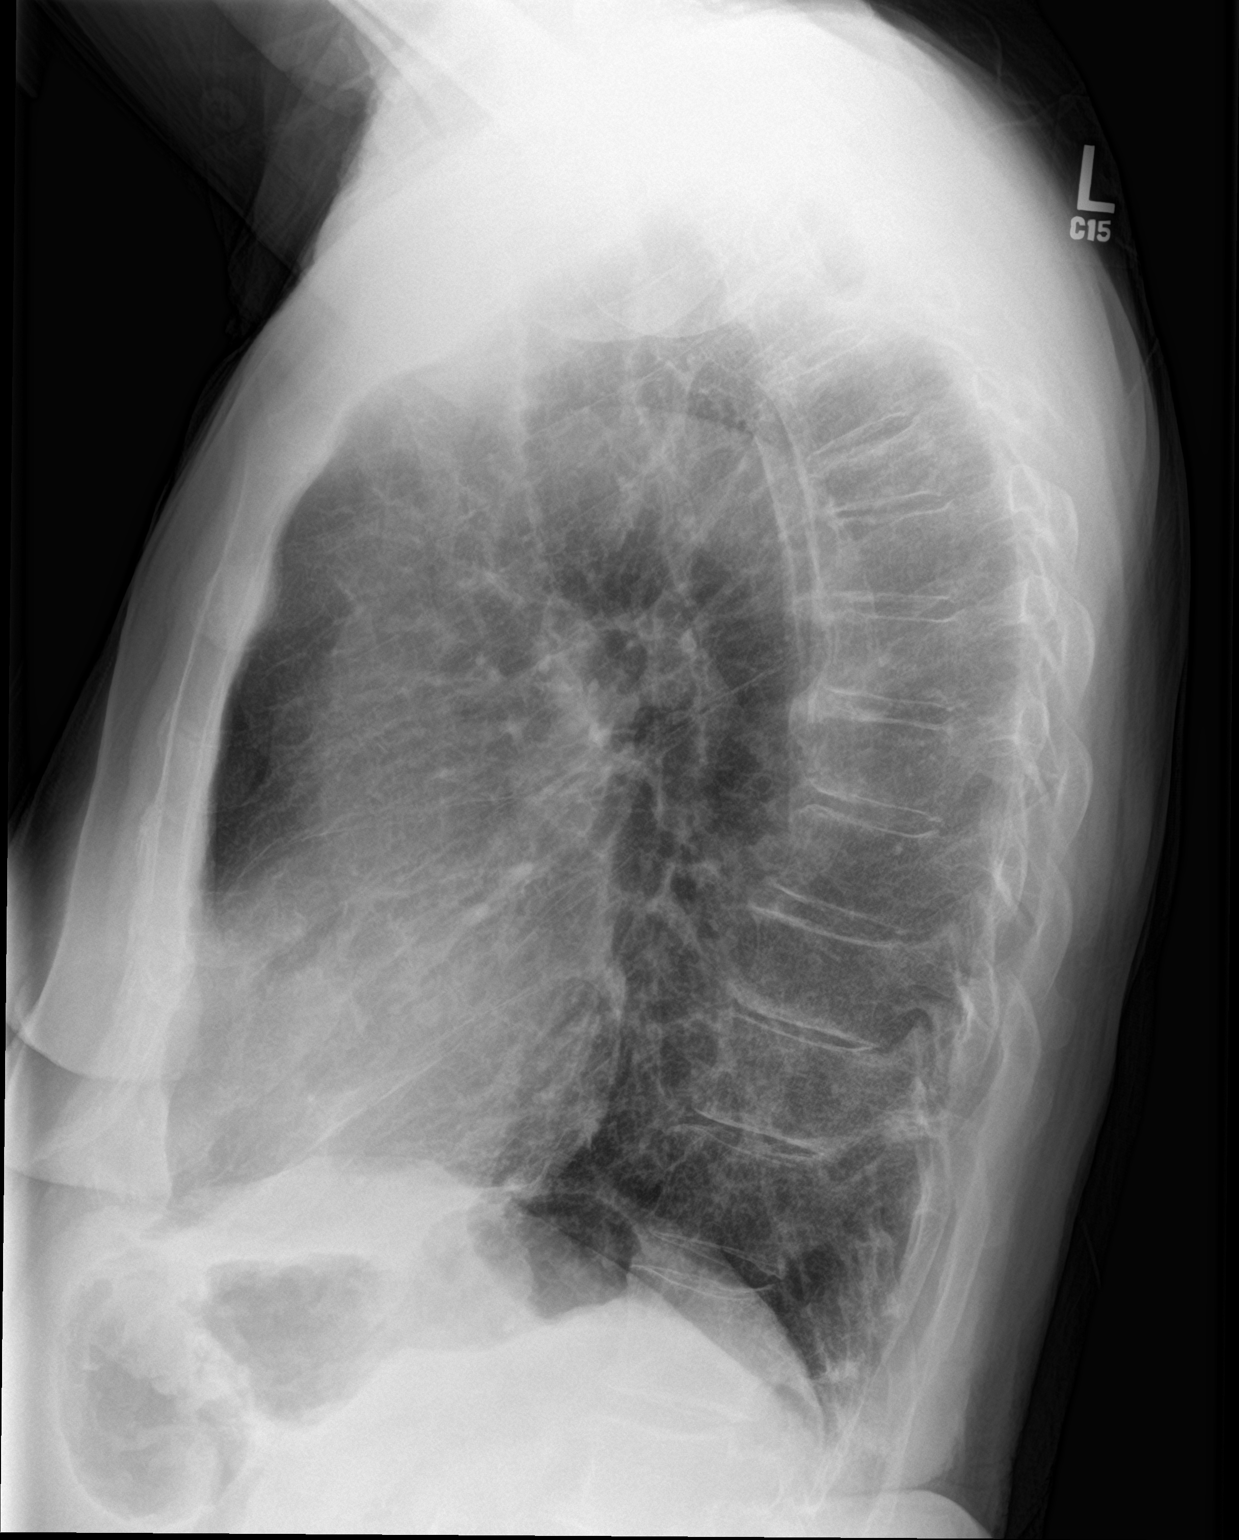

[2 of 2 positions shown; findings below may reference images not displayed]

FINDINGS: The lungs are well-expanded. The interstitial markings are coarse.
Patchy areas of parenchymal consolidation of present peripherally in
the left mid lung and at the right lung base. There is no pleural
effusion or pneumothorax. The heart and pulmonary vascularity are
normal. There is calcification in the wall of the aortic arch and
descending thoracic aorta. There is mild partial compression of T5
which is new. The loss of height anteriorly is 40% and posteriorly
is 15%.
IMPRESSION: COPD. Subsegmental atelectasis or developing pneumonia at the right
lung base and peripherally in the left mid lung. Followup PA and
lateral chest X-ray is recommended in 3-4 weeks following trial of
antibiotic therapy to ensure resolution and exclude underlying
malignancy.

No overt CHF.

Thoracic aortic atherosclerosis.

New partial compression of the body of T5.

## 2019-08-15 ENCOUNTER — Other Ambulatory Visit: Payer: Self-pay

## 2019-08-15 ENCOUNTER — Ambulatory Visit (INDEPENDENT_AMBULATORY_CARE_PROVIDER_SITE_OTHER): Payer: Medicare Other | Admitting: Ophthalmology

## 2019-08-15 ENCOUNTER — Encounter (INDEPENDENT_AMBULATORY_CARE_PROVIDER_SITE_OTHER): Payer: Self-pay | Admitting: Ophthalmology

## 2019-08-15 DIAGNOSIS — H353211 Exudative age-related macular degeneration, right eye, with active choroidal neovascularization: Secondary | ICD-10-CM | POA: Diagnosis not present

## 2019-08-15 DIAGNOSIS — H59039 Cystoid macular edema following cataract surgery, unspecified eye: Secondary | ICD-10-CM

## 2019-08-15 MED ORDER — RANIBIZUMAB 0.5 MG/0.05ML IZ SOLN FOR KALEIDOSCOPE
0.5000 mg | INTRAVITREAL | Status: AC | PRN
  Administered 2019-08-15: .5 mg via INTRAVITREAL

## 2019-08-15 NOTE — Progress Notes (Signed)
08/15/2019     CHIEF COMPLAINT Patient presents for Retina Follow Up   HISTORY OF PRESENT ILLNESS: Chad Morrison is a 86 y.o. 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  Severity is moderate.  Duration of 5 weeks.  Since onset it is stable.  I, the attending physician,  performed the HPI with the patient and updated documentation appropriately.          Comments    5 Week AMD f\u OD. Possible Lucentis OD. OCT 3 Week f\u Irvine-Glass Syndrome OD  Pt states vision is stable. Pt using gtts as directed.        Last edited by Tilda Franco on 08/15/2019 10:13 AM. (History)      Referring physician: Haywood Pao, MD [place],  [place] [id number]  HISTORICAL INFORMATION:   Selected notes from the MEDICAL RECORD NUMBER       CURRENT MEDICATIONS: Current Outpatient Medications (Ophthalmic Drugs)  Medication Sig  . Bromfenac Sodium (PROLENSA) 0.07 % SOLN Apply 1 drop to eye daily.   No current facility-administered medications for this visit. (Ophthalmic Drugs)   Current Outpatient Medications (Other)  Medication Sig  . albuterol (VENTOLIN HFA) 108 (90 Base) MCG/ACT inhaler USE 1 TO 2 INHALATIONS     ORALLY EVERY 6 HOURS AS    NEEDED FOR WHEEZING OR     SHORTNESS OF BREATH  . ALPRAZolam (XANAX) 0.25 MG tablet Take 0.25 mg by mouth at bedtime as needed for sleep.   Marland Kitchen apixaban (ELIQUIS) 5 MG TABS tablet Take 1 tablet (5 mg total) by mouth 2 (two) times daily.  . Ascorbic Acid (VITAMIN C PO) Take by mouth. Not sure the dosage  . budesonide-formoterol (SYMBICORT) 160-4.5 MCG/ACT inhaler Inhale 2 puffs into the lungs in the morning and at bedtime.  . Calcium Carbonate-Vitamin D (CALCIUM-VITAMIN D) 600-125 MG-UNIT TABS Take by mouth 2 (two) times daily.  Marland Kitchen diltiazem (CARDIZEM CD) 120 MG 24 hr capsule Take 1 capsule (120 mg total) by mouth 2 (two) times daily.  . diphenhydrAMINE  (BENADRYL) 25 MG tablet Take 50 mg by mouth at bedtime as needed.   . FEROSUL 325 (65 Fe) MG tablet Take 325 mg by mouth daily.  . folic acid (FOLVITE) 1 MG tablet Take 2 mg by mouth daily.   Marland Kitchen levocetirizine (XYZAL) 5 MG tablet Take 1 tablet (5 mg total) by mouth every evening.  Marland Kitchen levothyroxine (SYNTHROID, LEVOTHROID) 75 MCG tablet Take 75 mcg by mouth daily before breakfast.  . montelukast (SINGULAIR) 10 MG tablet Take 1 tablet (10 mg total) by mouth at bedtime.  . MULTAQ 400 MG tablet TAKE 1 TABLET TWICE DAILY  WITH MEALS  . Multiple Vitamin (MULTIVITAMIN WITH MINERALS) TABS tablet Take 1 tablet daily by mouth.  . Multiple Vitamins-Minerals (OCUVITE PRESERVISION PO) Take 1 tablet 2 (two) times daily by mouth.  . Multiple Vitamins-Minerals (ZINC PO) Take by mouth. Not sure the dosage  . mupirocin ointment (BACTROBAN) 2 % Apply 1 application topically 2 (two) times daily as needed.  . NON FORMULARY 8 tabs weekly  . omeprazole (PRILOSEC) 40 MG capsule Take 40 mg daily by mouth.  . pantoprazole (PROTONIX) 40 MG tablet Take 40 mg by mouth daily.  . simvastatin (ZOCOR) 10 MG tablet Take 10 mg by mouth every evening.   . triamcinolone (KENALOG) 0.1 %  paste APPLY A SMALL AMOUNT TO MOUTH ULCER THREE TIMES DAILY FOR 1 WEEK   No current facility-administered medications for this visit. (Other)      REVIEW OF SYSTEMS:    ALLERGIES Allergies  Allergen Reactions  . Penicillins Hives    Has patient had a PCN reaction causing immediate rash, facial/tongue/throat swelling, SOB or lightheadedness with hypotension: No Has patient had a PCN reaction causing severe rash involving mucus membranes or skin necrosis: No Has patient had a PCN reaction that required hospitalization: No Has patient had a PCN reaction occurring within the last 10 years: Yes If all of the above answers are "NO", then may proceed with Cephalosporin use.   . Sulfa Antibiotics Other (See Comments)    Unknown to patient   .  Latex Hives and Rash    PAST MEDICAL HISTORY Past Medical History:  Diagnosis Date  . Anxiety   . Asthma   . Asthmatic bronchitis   . Atrial flutter (HCC)    chads2vasc score is 3  . Ehrlichiosis 3267  . GERD (gastroesophageal reflux disease)   . Granulomatosis with polyangiitis ([place])   . HTN (hypertension)   . Hyperlipidemia   . Paroxysmal atrial fibrillation (HCC)   . Prostate cancer ([place])   . Seasonal allergies    Past Surgical History:  Procedure Laterality Date  . BASAL CELL CARCINOMA EXCISION  2013   EYELID  . HERNIA REPAIR Bilateral 2000, 2002  . KNEE ARTHROSCOPY  2000  . prostectomy  1995   RADICAL  . TONSILLECTOMY  1940    FAMILY HISTORY Family History  Problem Relation Age of Onset  . Asthma Mother 55  . Allergies Mother   . Ulcers Mother   . CVA Father 54  . Heart Problems Father   . Other Father        NEUROLOGIC ISSUES   . Other Brother 11       BICYCLE ACCIDENT  . Migraines Son   . Diabetes Mellitus I Son     SOCIAL HISTORY Social History   Tobacco Use  . Smoking status: Former Smoker    Packs/day: 0.50    Years: 4.00    Pack years: 2.00    Types: Cigarettes    Quit date: 02/28/1961    Years since quitting: 58.4  . Smokeless tobacco: Never Used  Vaping Use  . Vaping Use: Never used  Substance Use Topics  . Alcohol use: Yes  . Drug use: No         OPHTHALMIC EXAM:  Base Eye Exam    Visual Acuity (Snellen - Linear)      Right Left   Dist [place] 20/50 -1 20/30 -1   Dist ph [place] 20/40 -2 20/25 -2       Tonometry (Tonopen, 10:16 AM)      Right Left   Pressure 15 13       Pupils      Pupils Dark Light Shape React APD   Right PERRL 5 4.5 Irregular Slow None   Left PERRL 4 3.5 Round Slow None       Visual Fields (Counting fingers)      Left Right    Full Full       Neuro/Psych    Oriented x3: Yes   Mood/Affect: Normal       Dilation    Right eye: 1.0% Mydriacyl, 2.5% Phenylephrine @ 10:16 AM        Slit Lamp and  Fundus Exam  External Exam      Right Left   External Normal Normal       Slit Lamp Exam      Right Left   Lids/Lashes Normal Normal   Conjunctiva/Sclera White and quiet White and quiet   Cornea Clear Clear   Anterior Chamber Deep and quiet Deep and quiet   Iris Round and reactive Round and reactive   Lens Posterior chamber intraocular lens Clear   Anterior Vitreous Normal Normal       Fundus Exam      Right Left   Posterior Vitreous Normal Normal   Disc Normal    C/D Ratio 0.45    Macula Cystoid macular edema, Soft drusen in the FAZ    Vessels Normal    Periphery Normal           IMAGING AND PROCEDURES  Imaging and Procedures for 08/15/19  OCT, Retina - OU - Both Eyes       Right Eye Quality was good. Scan locations included subfoveal. Central Foveal Thickness: 459. Findings include cystoid macular edema.   Left Eye Quality was good. Scan locations included subfoveal. Central Foveal Thickness: 347.   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  CME now is improving post recent YAG vitreal lysis for strand into the anterior chamber.       Intravitreal Injection, Pharmacologic Agent - OD - Right Eye       Time Out 08/15/2019. 11:05 AM. Confirmed correct patient, procedure, site, and patient consented.   Anesthesia Topical anesthesia was used. Anesthetic medications included [place] 3.5%.   Procedure Preparation included 10% betadine to eyelids, Ofloxacin , 5% betadine to ocular surface. A 30 gauge needle was used.   Injection:  0.5 mg ranibizumab (LUCENTIS) 0.5 MG/0.05ML intravitreal injection   NDC: 50242-080-01, Lot: [id number]   Route: Intravitreal, Site: Right Eye, Waste: 0 mg  Post-op Post injection exam found visual acuity of at least counting fingers. The patient tolerated the procedure well. There were no complications. The patient received written and verbal post procedure care education. Post injection  medications were not given.                 ASSESSMENT/PLAN:  Irvine-Gass syndrome CME now is improving post recent YAG vitreal lysis for strand into the anterior chamber.   Exudative age-related macular degeneration of right eye with active choroidal neovascularization (HCC) Treat with intravitreal Lucentis every 5 weeks until this condition improves or resolves      ICD-10-CM   1. Exudative age-related macular degeneration of right eye with active choroidal neovascularization (HCC)  H35.3211 OCT, Retina - OU - Both Eyes    Intravitreal Injection, Pharmacologic Agent - OD - Right Eye    ranibizumab (LUCENTIS) 0.5 MG/0.05ML intravitreal injection 0.5 mg  2. 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  Patient is to continue on PROLENSA topically daily OD  2.  3.  Ophthalmic Meds Ordered this visit:  Meds ordered this encounter  Medications  . ranibizumab (LUCENTIS) 0.5 MG/0.05ML intravitreal injection 0.5 mg       Return in about 5 weeks (around 09/19/2019) for LUCENTIS 0.5 OCT, OD.  There are no Patient Instructions on file for this visit.   Explained the diagnoses, plan, and follow up with the patient and they expressed understanding.  Patient expressed understanding of the importance of proper follow up care.   Clent Demark Ronie Fleeger M.D. Diseases & Surgery of the Retina and  Vitreous Retina & Diabetic [place] 08/15/19     Abbreviations: M myopia (nearsighted); A astigmatism; H hyperopia (farsighted); P presbyopia; Mrx spectacle prescription;  CTL contact lenses; OD right eye; OS left eye; OU both eyes  XT exotropia; ET esotropia; PEK punctate epithelial keratitis; PEE punctate epithelial erosions; DES dry eye syndrome; MGD meibomian gland dysfunction; ATs artificial tears; PFAT's preservative free artificial tears; [place] nuclear sclerotic cataract; PSC posterior subcapsular cataract; ERM epi-retinal membrane; PVD posterior vitreous detachment; RD retinal detachment; DM  diabetes mellitus; DR diabetic retinopathy; NPDR non-proliferative diabetic retinopathy; PDR proliferative diabetic retinopathy; CSME clinically significant macular edema; DME diabetic macular edema; dbh dot blot hemorrhages; CWS cotton wool spot; POAG primary open angle glaucoma; C/D cup-to-disc ratio; HVF humphrey visual field; GVF goldmann visual field; OCT optical coherence tomography; IOP intraocular pressure; BRVO Branch retinal vein occlusion; CRVO central retinal vein occlusion; CRAO central retinal artery occlusion; BRAO branch retinal artery occlusion; RT retinal tear; SB scleral buckle; PPV pars plana vitrectomy; VH Vitreous hemorrhage; PRP panretinal laser photocoagulation; IVK intravitreal kenalog; VMT vitreomacular traction; MH Macular hole;  NVD neovascularization of the disc; NVE neovascularization elsewhere; AREDS age related eye disease study; ARMD age related macular degeneration; POAG primary open angle glaucoma; EBMD epithelial/anterior basement membrane dystrophy; ACIOL anterior chamber intraocular lens; IOL intraocular lens; PCIOL posterior chamber intraocular lens; Phaco/IOL phacoemulsification with intraocular lens placement; [place] photorefractive keratectomy; LASIK laser assisted in situ keratomileusis; HTN hypertension; DM diabetes mellitus; COPD chronic obstructive pulmonary disease

## 2019-08-15 NOTE — Assessment & Plan Note (Signed)
CME now is improving post recent YAG vitreal lysis for strand into the anterior chamber.

## 2019-08-15 NOTE — Assessment & Plan Note (Signed)
Treat with intravitreal Lucentis every 5 weeks until this condition improves or resolves

## 2019-08-16 ENCOUNTER — Other Ambulatory Visit: Payer: Self-pay | Admitting: Internal Medicine

## 2019-08-16 NOTE — Telephone Encounter (Signed)
Prescription refill request for Eliquis received.  Last office visit: [place], 03/18/2019 Scr: 1.1 (03/18/2019) Age: 86 y.o. Weight: 65.9 kg   Prescription refill sent.

## 2019-09-03 ENCOUNTER — Telehealth: Payer: Self-pay | Admitting: Pulmonary Disease

## 2019-09-03 NOTE — Telephone Encounter (Signed)
Can start over-the-counter Mucinex.  Can also start nasal saline rinses.Keep upcoming appointment with EW NP tomorrow.  Please route this message to her as FYI.Wyn Quaker, FNP

## 2019-09-03 NOTE — Telephone Encounter (Signed)
Patient reports several days of cough, it was loose and he could cough it up to start with and it was clear.  Now he is unable to cough it up, but it still rattles in his chest.  He denies any chest congestion.  He said his Albuterol inhaler was not helping.  He turned his air conditioning on and he is feeling some better.  He also reports sinus congestion. He reports some yellow phlem in the mornings.  He is using his Symbicort 2 puffs bid, taking the Xyal, montelukast.  He has an appointment with [facility] tomorrow, but wants to know if he can do anything else until that appointment.  Chad Morrison, please advise.  Thank you.

## 2019-09-03 NOTE — Telephone Encounter (Signed)
Spoke with pt, aware of recs.  Nothing further needed at this time- will close encounter.   

## 2019-09-04 ENCOUNTER — Ambulatory Visit (INDEPENDENT_AMBULATORY_CARE_PROVIDER_SITE_OTHER): Payer: Medicare Other | Admitting: Primary Care

## 2019-09-04 ENCOUNTER — Other Ambulatory Visit: Payer: Self-pay

## 2019-09-04 ENCOUNTER — Encounter: Payer: Self-pay | Admitting: Primary Care

## 2019-09-04 ENCOUNTER — Ambulatory Visit (INDEPENDENT_AMBULATORY_CARE_PROVIDER_SITE_OTHER): Payer: Medicare Other

## 2019-09-04 VITALS — BP 128/78 | HR 76 | Temp 98.1°F | Ht 69.0 in | Wt 147.2 lb

## 2019-09-04 DIAGNOSIS — J4540 Moderate persistent asthma, uncomplicated: Secondary | ICD-10-CM | POA: Diagnosis not present

## 2019-09-04 DIAGNOSIS — J449 Chronic obstructive pulmonary disease, unspecified: Secondary | ICD-10-CM | POA: Diagnosis not present

## 2019-09-04 DIAGNOSIS — J310 Chronic rhinitis: Secondary | ICD-10-CM | POA: Diagnosis not present

## 2019-09-04 DIAGNOSIS — J984 Other disorders of lung: Secondary | ICD-10-CM | POA: Diagnosis not present

## 2019-09-04 DIAGNOSIS — J40 Bronchitis, not specified as acute or chronic: Secondary | ICD-10-CM

## 2019-09-04 MED ORDER — AZELASTINE HCL 0.1 % NA SOLN: 1.0000 | Freq: Two times a day (BID) | NASAL | 1 refills | Status: DC

## 2019-09-04 NOTE — Assessment & Plan Note (Addendum)
-   Patient has chronic allergic rhinitis. He reports increased nasal congestion, cough and post nasal drip symptoms over the last week  - Continue Xyzal and Singulair as prescribed  - Continue Saline nasal spray twice daily - Adding Astelin nasal spray (patient is intolerant to Intranasal corticosteroids)

## 2019-09-04 NOTE — Progress Notes (Signed)
Reviewed and agree with assessment/plan.   Chesley Mires, MD [facility] Pulmonary/Critical Care 09/04/2019, 11:56 AM Pager:  [phone]

## 2019-09-04 NOTE — Patient Instructions (Addendum)
  Recommendations: Continue Symbicort two puffs twice daily Continue Xyzal  Continue Saline nasal spray twice daily  Orders: CXR today re: cough  Rx: Astelin nasal spray 1 spray per nostril once a day (Epistaxis is listed as an possible side effect, I recommend you use it once a day and note twice. Discontinue if occurs)  Referral: Asthma/allergy re: asthma, chronic rhinitis  Follow-up: 6 months with NEW pulmonary MD (former [facility]) Or sooner if needed

## 2019-09-04 NOTE — Assessment & Plan Note (Addendum)
-   Patient has increased cough with clear mucus production x 1 week associated shortness of breath with coughing fits only. No overt wheezing on exam. Unable to take prednisone or mucinex. Eosinophils in 2019 were 100 (previously 500). Patient is on methotrexate, recommend referral to allergy to see if patient would qualify for immunotherapy  Plan: - Continue Symbicort 160 two puffs twice daily - Check CXR today re: bronchitis  - Referral to Asthma/allergy re: asthma, chronic rhinitis - Follow-up in 6 months with NEW pulmonary MD (former [place]) Or sooner if needed

## 2019-09-04 NOTE — Progress Notes (Signed)
@Patient  ID: Chad Morrison, male    DOB: June 04, 1932, 86 y.o.   MRN: [id number]  Chief Complaint  Patient presents with  . Follow-up    pt is coughing and having a flare up of asthma.pt states mucus is clear.pt needs possible referral for allergist    Referring provider: Tisovec, Fransico Him, MD  HPI: 86 year old male, former smoker. Past medical history significant for COPD/asthma, pulmonary nodules, chronic rhinitis, A. fib, Irvine-gass syndrome.  Former patient of Dr. Lake Bells (plan to establish with Dr. Vaughan Browner), last seen by nurse practitioner April 09, 2019.  He is maintained on Symbicort and Singulair daily.  CT in March showed stable scattered small bilateral pulmonary nodules, largest measuring up to 6 mm in the left lower lobe unchanged since 2019.  Continues follow-up with rheumatology at [facility].  09/04/2019 Patient presents today for 58-month follow-up visit. Patient called our office yesterday with reports of several days of a loose cough, unable to produce mucus. He has associated sinus congestion with some yellow mucus in the morning.  He is compliant with Symbicort 2 puffs twice daily, Xyzal and Singulair.  He reports that albuterol inhaler is not helping.  Patient was recommended to start over-the-counter Mucinex and saline nasal rinses.  He tried mucinex in the past but made his symptoms worse. He is using saline rinse twice a day. He has been on nasacort in the past but got epistaxis. Denies sinus pain. He has increased shortness of breath with congestion/cough. Albuterol helps a little.   Eosinophils were 100 in November 2019 (previously 500 in October 2019). He is on methotrexate 15mg  for Wagners disease. IgE 85.    Allergies  Allergen Reactions  . Penicillins Hives    Has patient had a PCN reaction causing immediate rash, facial/tongue/throat swelling, SOB or lightheadedness with hypotension: No Has patient had a PCN reaction causing severe rash involving mucus membranes or skin  necrosis: No Has patient had a PCN reaction that required hospitalization: No Has patient had a PCN reaction occurring within the last 10 years: Yes If all of the above answers are "NO", then may proceed with Cephalosporin use.   . Sulfa Antibiotics Other (See Comments)    Unknown to patient   . Latex Hives and Rash    Immunization History  Administered Date(s) Administered  . Influenza, High Dose Seasonal PF 12/27/2016, 12/18/2017, 11/29/2018  . Influenza-Unspecified 12/22/2016  . PFIZER SARS-COV-2 Vaccination 03/16/2019, 04/04/2019  . Pneumococcal Conjugate-13 01/04/2016  . Pneumococcal Polysaccharide-23 01/03/2014  . Pneumococcal-Unspecified 10/31/2016    Past Medical History:  Diagnosis Date  . Anxiety   . Asthma   . Asthmatic bronchitis   . Atrial flutter (HCC)    chads2vasc score is 3  . Ehrlichiosis 3016  . GERD (gastroesophageal reflux disease)   . 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 HTN (hypertension)   . Hyperlipidemia   . Paroxysmal atrial fibrillation (HCC)   . Prostate cancer ([place])   . Seasonal allergies     Tobacco History: Social History   Tobacco Use  Smoking Status Former Smoker  . Packs/day: 0.50  . Years: 4.00  . Pack years: 2.00  . Types: Cigarettes  . Quit date: 02/28/1961  . Years since quitting: 58.5  Smokeless Tobacco Never Used   Counseling given: Not Answered   Outpatient Medications Prior to Visit  Medication Sig Dispense Refill  . albuterol (VENTOLIN HFA) 108 (90 Base) MCG/ACT inhaler USE 1 TO 2 INHALATIONS     ORALLY EVERY 6 HOURS  AS    NEEDED FOR WHEEZING OR     SHORTNESS OF BREATH 54 g 3  . ALPRAZolam (XANAX) 0.25 MG tablet Take 0.25 mg by mouth at bedtime as needed for sleep.     . Ascorbic Acid (VITAMIN C PO) Take by mouth. Not sure the dosage    . Bromfenac Sodium (PROLENSA) 0.07 % SOLN Apply 1 drop to eye daily.    . budesonide-formoterol (SYMBICORT) 160-4.5 MCG/ACT inhaler Inhale 2 puffs into the lungs in the morning  and at bedtime. 3 Inhaler 3  . Calcium Carbonate-Vitamin D (CALCIUM-VITAMIN D) 600-125 MG-UNIT TABS Take by mouth 2 (two) times daily.    Marland Kitchen diltiazem (CARDIZEM CD) 120 MG 24 hr capsule Take 1 capsule (120 mg total) by mouth 2 (two) times daily. 60 capsule 3  . diphenhydrAMINE (BENADRYL) 25 MG tablet Take 50 mg by mouth at bedtime as needed.     Marland Kitchen ELIQUIS 5 MG TABS tablet TAKE 1 TABLET TWICE A DAY 180 tablet 1  . FEROSUL 325 (65 Fe) MG tablet Take 325 mg by mouth daily.    . folic acid (FOLVITE) 1 MG tablet Take 2 mg by mouth daily.     Marland Kitchen levocetirizine (XYZAL) 5 MG tablet Take 1 tablet (5 mg total) by mouth every evening. 90 tablet 3  . levothyroxine (SYNTHROID, LEVOTHROID) 75 MCG tablet Take 75 mcg by mouth daily before breakfast.    . montelukast (SINGULAIR) 10 MG tablet Take 1 tablet (10 mg total) by mouth at bedtime. 30 tablet 0  . MULTAQ 400 MG tablet TAKE 1 TABLET TWICE DAILY  WITH MEALS 180 tablet 3  . Multiple Vitamin (MULTIVITAMIN WITH MINERALS) TABS tablet Take 1 tablet daily by mouth.    . Multiple Vitamins-Minerals (OCUVITE PRESERVISION PO) Take 1 tablet 2 (two) times daily by mouth.    . Multiple Vitamins-Minerals (ZINC PO) Take by mouth. Not sure the dosage    . pantoprazole (PROTONIX) 40 MG tablet Take 40 mg by mouth daily.    . simvastatin (ZOCOR) 10 MG tablet Take 10 mg by mouth every evening.     . mupirocin ointment (BACTROBAN) 2 % Apply 1 application topically 2 (two) times daily as needed.    . NON FORMULARY 8 tabs weekly    . triamcinolone (KENALOG) 0.1 % paste APPLY A SMALL AMOUNT TO MOUTH ULCER THREE TIMES DAILY FOR 1 WEEK    . omeprazole (PRILOSEC) 40 MG capsule Take 40 mg daily by mouth.     No facility-administered medications prior to visit.   Review of Systems  Review of Systems  Constitutional: Negative.   HENT: Positive for congestion and postnasal drip. Negative for sinus pressure and sinus pain.   Respiratory: Positive for cough. Negative for shortness of  breath and wheezing.     Physical Exam  BP 128/78 (BP Location: Left Arm, Cuff Size: Normal)   Pulse 76   Temp 98.1 F (36.7 C) (Oral)   Ht 5\' 9"  (1.753 m)   Wt 147 lb 3.2 oz (66.8 kg)   SpO2 98%   BMI 21.74 kg/m  Physical Exam Constitutional:      General: He is not in acute distress.    Appearance: Normal appearance. He is not ill-appearing.  HENT:     Right Ear: Tympanic membrane normal.     Left Ear: Tympanic membrane normal.     Mouth/Throat:     Mouth: Mucous membranes are moist.     Pharynx: Oropharynx is clear.  Cardiovascular:     Rate and Rhythm: Normal rate and regular rhythm.     Heart sounds: Murmur heard.   Pulmonary:     Effort: Pulmonary effort is normal.     Breath sounds: Rhonchi present. No wheezing.     Comments: Faint pleural rub/rhonchi Musculoskeletal:        General: Normal range of motion.  Skin:    General: Skin is warm and dry.  Neurological:     General: No focal deficit present.     Mental Status: He is alert and oriented to person, place, and time. Mental status is at baseline.  Psychiatric:        Mood and Affect: Mood normal.        Behavior: Behavior normal.        Thought Content: Thought content normal.        Judgment: Judgment normal.      Lab Results:  CBC    Component Value Date/Time   WBC 5.5 01/19/2018 0552   RBC 3.42 (L) 01/19/2018 0552   HGB 8.3 (L) 01/19/2018 0552   HCT 26.6 (L) 01/19/2018 0552   PLT 220 01/19/2018 0552   MCV 77.8 (L) 01/19/2018 0552   MCH 24.3 (L) 01/19/2018 0552   MCHC 31.2 01/19/2018 0552   RDW 19.9 (H) 01/19/2018 0552   LYMPHSABS 0.4 (L) 01/18/2018 2142   MONOABS 0.6 01/18/2018 2142   EOSABS 0.1 01/18/2018 2142   BASOSABS 0.0 01/18/2018 2142    BMET    Component Value Date/Time   NA 138 01/18/2018 2142   K 4.3 01/18/2018 2142   CL 109 01/18/2018 2142   CO2 20 (L) 01/18/2018 2142   GLUCOSE 132 (H) 01/18/2018 2142   BUN 30 (H) 01/18/2018 2142   CREATININE 1.38 (H) 01/18/2018 2142    CALCIUM 9.5 01/18/2018 2142   GFRNONAA 46 (L) 01/18/2018 2142   GFRAA 53 (L) 01/18/2018 2142    BNP No results found for: BNP  ProBNP No results found for: PROBNP  Imaging: Intravitreal Injection, Pharmacologic Agent - OD - Right Eye  Result Date: 08/15/2019 Time Out 08/15/2019. 11:05 AM. Confirmed correct patient, procedure, site, and patient consented. Anesthesia Topical anesthesia was used. Anesthetic medications included Akten 3.5%. Procedure Preparation included 10% betadine to eyelids, Ofloxacin , 5% betadine to ocular surface. A 30 gauge needle was used. Injection: 0.5 mg ranibizumab (LUCENTIS) 0.5 MG/0.05ML intravitreal injection   NDC: 50242-080-01, Lot: [id number]   Route: Intravitreal, Site: Right Eye, Waste: 0 mg Post-op Post injection exam found visual acuity of at least counting fingers. The patient tolerated the procedure well. There were no complications. The patient received written and verbal post procedure care education. Post injection medications were not given.   OCT, Retina - OU - Both Eyes  Result Date: 08/15/2019 Right Eye Quality was good. Scan locations included subfoveal. Central Foveal Thickness: 459. Findings include cystoid macular edema. Left Eye Quality was good. Scan locations included subfoveal. Central Foveal Thickness: 347. 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  CME now is improving post recent YAG vitreal lysis for strand into the anterior chamber.    Assessment & Plan:   Asthma - Patient has increased cough with clear mucus production x 1 week associated shortness of breath with coughing fits only. No overt wheezing on exam. Unable to take prednisone or mucinex. Eosinophils in 2019 were 100 (previously 500). Patient is on methotrexate, recommend referral to allergy to see if patient would qualify  for immunotherapy  Plan: - Continue Symbicort 160 two puffs twice daily - Check CXR today re: bronchitis  -  Referral to Asthma/allergy re: asthma, chronic rhinitis - Follow-up in 6 months with NEW pulmonary MD (former [facility]) Or sooner if needed  Chronic rhinitis - Patient has chronic allergic rhinitis. He reports increased nasal congestion, cough and post nasal drip symptoms over the last week  - Continue Xyzal and Singulair as prescribed  - Continue Saline nasal spray twice daily - Adding Astelin nasal spray (patient is intolerant to Intranasal corticosteroids)    Martyn Ehrich, NP 09/04/2019

## 2019-09-05 NOTE — Progress Notes (Signed)
Attempted to call patient with CXR results, no answer/no voice mail.   Please let him know CXR showed no acute findings/ chronic COPD changes

## 2019-09-06 ENCOUNTER — Telehealth: Payer: Self-pay | Admitting: Primary Care

## 2019-09-06 MED ORDER — DOXYCYCLINE HYCLATE 100 MG PO TABS: 100.0000 mg | ORAL_TABLET | Freq: Two times a day (BID) | ORAL | 0 refills | Status: DC

## 2019-09-06 NOTE — Telephone Encounter (Signed)
Patient called and notified that medication was sent to his preferred pharmacy. He will call our office if he is not feeling better.

## 2019-09-06 NOTE — Telephone Encounter (Signed)
Called and spoke with pt who stated his cough is still persistent and he now is coughing up yellow phlegm. Pt does have congestion in chest.  Pt is having to use his albuterol inhaler about 3-4 times a day to help with his symptoms. Pt is taking the nasal spray as prescribed as well as all his other meds.  Pt denies any complaints of fever. Temp was 98.3 when pt took it while on the phone with me.  Pt is wanting recommendations to help with symptoms especially since he now has discolored phlegm. Beth, please advise.

## 2019-09-06 NOTE — Telephone Encounter (Signed)
Sent in course of doxycycline 1 tab twice daily x 1 week. If not better after completing let office know. Continue with previous recommendations as well.

## 2019-09-16 ENCOUNTER — Ambulatory Visit (HOSPITAL_COMMUNITY)
Admission: RE | Admit: 2019-09-16 | Discharge: 2019-09-16 | Disposition: A | Discharge status: Home / Self Care | Payer: Medicare Other | Source: Ambulatory Visit | Attending: Physician Assistant | Admitting: Physician Assistant

## 2019-09-16 ENCOUNTER — Other Ambulatory Visit: Payer: Self-pay

## 2019-09-16 ENCOUNTER — Encounter (HOSPITAL_COMMUNITY): Payer: Self-pay | Admitting: Physician Assistant

## 2019-09-16 VITALS — BP 130/78 | HR 78 | Ht 69.0 in | Wt 146.6 lb

## 2019-09-16 DIAGNOSIS — J45909 Unspecified asthma, uncomplicated: Secondary | ICD-10-CM | POA: Diagnosis not present

## 2019-09-16 DIAGNOSIS — I483 Typical atrial flutter: Secondary | ICD-10-CM | POA: Diagnosis not present

## 2019-09-16 DIAGNOSIS — I10 Essential (primary) hypertension: Secondary | ICD-10-CM | POA: Diagnosis not present

## 2019-09-16 DIAGNOSIS — Z8546 Personal history of malignant neoplasm of prostate: Secondary | ICD-10-CM | POA: Insufficient documentation

## 2019-09-16 DIAGNOSIS — K219 Gastro-esophageal reflux disease without esophagitis: Secondary | ICD-10-CM | POA: Diagnosis not present

## 2019-09-16 DIAGNOSIS — Z880 Allergy status to penicillin: Secondary | ICD-10-CM | POA: Diagnosis not present

## 2019-09-16 DIAGNOSIS — I288 Other diseases of pulmonary vessels: Secondary | ICD-10-CM | POA: Insufficient documentation

## 2019-09-16 DIAGNOSIS — Z87891 Personal history of nicotine dependence: Secondary | ICD-10-CM | POA: Diagnosis not present

## 2019-09-16 DIAGNOSIS — Z91040 Latex allergy status: Secondary | ICD-10-CM | POA: Diagnosis not present

## 2019-09-16 DIAGNOSIS — Z7901 Long term (current) use of anticoagulants: Secondary | ICD-10-CM | POA: Insufficient documentation

## 2019-09-16 DIAGNOSIS — E785 Hyperlipidemia, unspecified: Secondary | ICD-10-CM | POA: Diagnosis not present

## 2019-09-16 DIAGNOSIS — Z79890 Hormone replacement therapy (postmenopausal): Secondary | ICD-10-CM | POA: Insufficient documentation

## 2019-09-16 DIAGNOSIS — Z79899 Other long term (current) drug therapy: Secondary | ICD-10-CM | POA: Insufficient documentation

## 2019-09-16 DIAGNOSIS — I4819 Other persistent atrial fibrillation: Secondary | ICD-10-CM | POA: Insufficient documentation

## 2019-09-16 DIAGNOSIS — I350 Nonrheumatic aortic (valve) stenosis: Secondary | ICD-10-CM | POA: Insufficient documentation

## 2019-09-16 DIAGNOSIS — Z882 Allergy status to sulfonamides status: Secondary | ICD-10-CM | POA: Insufficient documentation

## 2019-09-16 DIAGNOSIS — D6869 Other thrombophilia: Secondary | ICD-10-CM | POA: Diagnosis not present

## 2019-09-16 LAB — BASIC METABOLIC PANEL
Anion gap: 10 (ref 5–15)
BUN: 18 mg/dL (ref 8–23)
CO2: 24 mmol/L (ref 22–32)
Calcium: 9.4 mg/dL (ref 8.9–10.3)
Chloride: 105 mmol/L (ref 98–111)
Creatinine, Ser: 1.36 mg/dL — ABNORMAL HIGH (ref 0.61–1.24)
GFR calc Af Amer: 55 mL/min — ABNORMAL LOW (ref >60)
GFR calc non Af Amer: 47 mL/min — ABNORMAL LOW (ref >60)
Glucose, Bld: 120 mg/dL — ABNORMAL HIGH (ref 70–99)
Potassium: 3.7 mmol/L (ref 3.5–5.1)
Sodium: 139 mmol/L (ref 135–145)

## 2019-09-16 LAB — CBC
HCT: 34.9 % — ABNORMAL LOW (ref 39.0–52.0)
Hemoglobin: 11.3 g/dL — ABNORMAL LOW (ref 13.0–17.0)
MCH: 28.7 pg (ref 26.0–34.0)
MCHC: 32.4 g/dL (ref 30.0–36.0)
MCV: 88.6 fL (ref 80.0–100.0)
Platelets: 213 10*3/uL (ref 150–400)
RBC: 3.94 MIL/uL — ABNORMAL LOW (ref 4.22–5.81)
RDW: 15.1 % (ref 11.5–15.5)
WBC: 7 10*3/uL (ref 4.0–10.5)
nRBC: 0 % (ref 0.0–0.2)

## 2019-09-16 NOTE — Patient Instructions (Signed)
Stop Multaq 

## 2019-09-16 NOTE — Progress Notes (Signed)
Primary Care Physician: Tisovec, Fransico Him, MD Referring Physician: Dr. Rosalyn Charters Winemiller is a 86 y.o. male with a h/o typical atrial flutter, on amiodarone for evaluation in the afib clinic. He saw Dr. [name] in August and was c/o of shortness of breath. There was concern of amiodarone toxicity and pt asked to reduce amio to 100 mg a day. He had refused typical atrial flutter ablation in the past. He saw pulmonology and found to be in rapid atrial fibrillation. Dr. [name]  was notified  and he was asked to have f/u here. He increased his amio back to 200 mg. Pulmonology is planning to repeat PFT's when his heart rate is controlled. 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 Pulmonology  questioned if lungs were also involved contributing to increased shortness of breath. He saw Dr. [name], 10/10. Amiodarone was stopped and multaq was started. He is on Eliquis for a CHADS2VASC score of 3.   On follow up today, patient reports he has done well since his last visit. He states he is in afib almost all of the time. He is unaware of his arrhythmia and only knows he is in afib by using his Chad. His primary concern at this time is his allergies.  Today, he denies symptoms of palpitations, chest pain, orthopnea, PND, lower extremity edema, dizziness, presyncope, syncope, or neurologic sequela. The patient is tolerating medications without difficulties and is otherwise without complaint today.  Past Medical History:  Diagnosis Date  . Anxiety   . Asthma   . Asthmatic bronchitis   . Atrial flutter (HCC)    chads2vasc score is 3  . Ehrlichiosis 1610  . GERD (gastroesophageal reflux disease)   . 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 HTN (hypertension)   . Hyperlipidemia   . Paroxysmal atrial fibrillation (HCC)   . Prostate cancer ([place])   . Seasonal allergies    Past Surgical History:  Procedure Laterality Date  . BASAL CELL CARCINOMA EXCISION  2013   EYELID    . HERNIA REPAIR Bilateral 2000, 2002  . KNEE ARTHROSCOPY  2000  . prostectomy  1995   RADICAL  . TONSILLECTOMY  1940    Current Outpatient Medications  Medication Sig Dispense Refill  . albuterol (VENTOLIN HFA) 108 (90 Base) MCG/ACT inhaler USE 1 TO 2 INHALATIONS     ORALLY EVERY 6 HOURS AS    NEEDED FOR WHEEZING OR     SHORTNESS OF BREATH 54 g 3  . ALPRAZolam (XANAX) 0.25 MG tablet Take 0.25 mg by mouth at bedtime as needed for sleep.     . Ascorbic Acid (VITAMIN C PO) Take by mouth. Not sure the dosage    . azelastine (ASTELIN) 0.1 % nasal spray Place 1 spray into both nostrils 2 (two) times daily. Use in each nostril as directed 30 mL 1  . Bromfenac Sodium (PROLENSA) 0.07 % SOLN Apply 1 drop to eye daily.    . budesonide-formoterol (SYMBICORT) 160-4.5 MCG/ACT inhaler Inhale 2 puffs into the lungs in the morning and at bedtime. 3 Inhaler 3  . Calcium Carbonate-Vitamin D (CALCIUM-VITAMIN D) 600-125 MG-UNIT TABS Take by mouth 2 (two) times daily.    Marland Kitchen diltiazem (CARDIZEM CD) 120 MG 24 hr capsule Take 1 capsule (120 mg total) by mouth 2 (two) times daily. 60 capsule 3  . diphenhydrAMINE (BENADRYL) 25 MG tablet Take 50 mg by mouth at bedtime as needed.     . doxycycline (VIBRA-TABS)  100 MG tablet Take 1 tablet (100 mg total) by mouth 2 (two) times daily. 14 tablet 0  . ELIQUIS 5 MG TABS tablet TAKE 1 TABLET TWICE A DAY 180 tablet 1  . FEROSUL 325 (65 Fe) MG tablet Take 325 mg by mouth daily.    . folic acid (FOLVITE) 1 MG tablet Take 2 mg by mouth daily.     Marland Kitchen levocetirizine (XYZAL) 5 MG tablet Take 1 tablet (5 mg total) by mouth every evening. 90 tablet 3  . levothyroxine (SYNTHROID, LEVOTHROID) 75 MCG tablet Take 75 mcg by mouth daily before breakfast.    . methotrexate 2.5 MG tablet 6 tabs weekly    . montelukast (SINGULAIR) 10 MG tablet Take 1 tablet (10 mg total) by mouth at bedtime. 30 tablet 0  . Multiple Vitamin (MULTIVITAMIN WITH MINERALS) TABS tablet Take 1 tablet daily by mouth.     . Multiple Vitamins-Minerals (OCUVITE PRESERVISION PO) Take 1 tablet 2 (two) times daily by mouth.    . Multiple Vitamins-Minerals (ZINC PO) Take by mouth. Not sure the dosage    . pantoprazole (PROTONIX) 40 MG tablet Take 40 mg by mouth daily.    . simvastatin (ZOCOR) 10 MG tablet Take 10 mg by mouth every evening.      No current facility-administered medications for this encounter.    Allergies  Allergen Reactions  . Penicillins Hives    Has patient had a PCN reaction causing immediate rash, facial/tongue/throat swelling, SOB or lightheadedness with hypotension: No Has patient had a PCN reaction causing severe rash involving mucus membranes or skin necrosis: No Has patient had a PCN reaction that required hospitalization: No Has patient had a PCN reaction occurring within the last 10 years: Yes If all of the above answers are "NO", then may proceed with Cephalosporin use.   . Sulfa Antibiotics Other (See Comments)    Unknown to patient   . Latex Hives and Rash    Social History   Socioeconomic History  . Marital status: Married    Spouse name: [name]  . Number of children: 2  . Years of education: Not on file  . Highest education level: Not on file  Occupational History  . Occupation: RETIRED  Tobacco Use  . Smoking status: Former Smoker    Packs/day: 0.50    Years: 4.00    Pack years: 2.00    Types: Cigarettes    Quit date: 02/28/1961    Years since quitting: 58.5  . Smokeless tobacco: Never Used  Vaping Use  . Vaping Use: Never used  Substance and Sexual Activity  . Alcohol use: Yes    Alcohol/week: 2.0 - 3.0 standard drinks    Types: 2 - 3 Standard drinks or equivalent per week  . Drug use: No  . Sexual activity: Not on file  Other Topics Concern  . Not on file  Social History Narrative   Pt recently moved to [place] from [place] to be near his son.   Retired.   Social Determinants of Health   Financial Resource Strain:   . Difficulty of Paying  Living Expenses:   Food Insecurity:   . Worried About Charity fundraiser in the Last Year:   . Arboriculturist in the Last Year:   Transportation Needs:   . Film/video editor (Medical):   Marland Kitchen Lack of Transportation (Non-Medical):   Physical Activity:   . Days of Exercise per Week:   . Minutes of Exercise per Session:  Stress:   . Feeling of Stress :   Social Connections:   . Frequency of Communication with Friends and Family:   . Frequency of Social Gatherings with Friends and Family:   . Attends Religious Services:   . Active Member of Clubs or Organizations:   . Attends Archivist Meetings:   Marland Kitchen Marital Status:   Intimate Partner Violence:   . Fear of Current or Ex-Partner:   . Emotionally Abused:   Marland Kitchen Physically Abused:   . Sexually Abused:     Family History  Problem Relation Age of Onset  . Asthma Mother 33  . Allergies Mother   . Ulcers Mother   . CVA Father 20  . Heart Problems Father   . Other Father        NEUROLOGIC ISSUES   . Other Brother 11       BICYCLE ACCIDENT  . Migraines Son   . Diabetes Mellitus I Son     ROS- All systems are reviewed and negative except as per the HPI above  Physical Exam: Vitals:   09/16/19 1041  BP: 130/78  Pulse: 78  Weight: 66.5 kg  Height: 5\' 9"  (1.753 m)   Wt Readings from Last 3 Encounters:  09/16/19 66.5 kg  09/04/19 66.8 kg  04/09/19 65.9 kg    Labs: Lab Results  Component Value Date   NA 138 01/18/2018   K 4.3 01/18/2018   CL 109 01/18/2018   CO2 20 (L) 01/18/2018   GLUCOSE 132 (H) 01/18/2018   BUN 30 (H) 01/18/2018   CREATININE 1.38 (H) 01/18/2018   CALCIUM 9.5 01/18/2018   No results found for: INR No results found for: CHOL, HDL, LDLCALC, TRIG   GEN- The patient is well appearing elderly male, alert and oriented x 3 today.   HEENT-head normocephalic, atraumatic, sclera clear, conjunctiva pink, hearing intact, trachea midline. Lungs- Clear to ausculation bilaterally, normal work of  breathing Heart- irregular rate and rhythm, no rubs or gallops, 2/6 systolic murmur GI- soft, NT, ND, + BS Extremities- no clubbing, cyanosis, or edema MS- no significant deformity or atrophy Skin- no rash or lesion Psych- euthymic mood, full affect Neuro- strength and sensation are intact   EKG- coarse afib vs atypical atrial flutter HR 78, RBBB, QRS 152, QTc 483  Epic records reviewed  Echo 10/23/17 - Left ventricle: The cavity size was normal. There was mild   concentric hypertrophy. Systolic function was normal. The   estimated ejection fraction was in the range of 60% to 65%. Wall   motion was normal; there were no regional wall motion   abnormalities. Features are consistent with a pseudonormal left   ventricular filling pattern, with concomitant abnormal relaxation   and increased filling pressure (grade 2 diastolic dysfunction).   Doppler parameters are consistent with indeterminate ventricular   filling pressure. - Aortic valve: Valve mobility was restricted. There was moderate   stenosis. - Aorta: Ascending aortic diameter: 37 mm (S). - Ascending aorta: The ascending aorta was mildly dilated. - Mitral valve: Transvalvular velocity was within the normal range.   There was no evidence for stenosis. There was mild regurgitation. - Left atrium: The atrium was severely dilated. - Right ventricle: The cavity size was normal. Wall thickness was   normal. Systolic function was normal. - Right atrium: The atrium was mildly dilated. - Atrial septum: No defect or patent foramen ovale was identified   by color flow Doppler. - Tricuspid valve: There was mild regurgitation. -  Pulmonary arteries: Systolic pressure was within the normal   range. PA peak pressure: 35 mm Hg (S).   Assessment and Plan: 1. Persistent atrial fibrillation/typical atrial flutter Amiodarone stopped 2/2 lung disease. Patient in rate controlled afib. We discussed therapeutic options today including rate  control. Given his age, comorbidities, and paucity of symptoms, will pursue rate control. Patient in agreement with plan.  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   3. Aortic Stenosis Moderate by echo 12/20/18.   Follow up in the AF clinic in 2 weeks.    [facility] [address] [place], [place] [address] [phone]

## 2019-09-19 ENCOUNTER — Other Ambulatory Visit: Payer: Self-pay

## 2019-09-19 ENCOUNTER — Encounter (INDEPENDENT_AMBULATORY_CARE_PROVIDER_SITE_OTHER): Payer: Self-pay | Admitting: Ophthalmology

## 2019-09-19 ENCOUNTER — Ambulatory Visit (INDEPENDENT_AMBULATORY_CARE_PROVIDER_SITE_OTHER): Payer: Medicare Other | Admitting: Ophthalmology

## 2019-09-19 DIAGNOSIS — H353211 Exudative age-related macular degeneration, right eye, with active choroidal neovascularization: Secondary | ICD-10-CM

## 2019-09-19 MED ORDER — RANIBIZUMAB 0.5 MG/0.05ML IZ SOLN FOR KALEIDOSCOPE
0.5000 mg | INTRAVITREAL | Status: AC | PRN
  Administered 2019-09-19: .5 mg via INTRAVITREAL

## 2019-09-19 NOTE — Progress Notes (Signed)
09/19/2019     CHIEF COMPLAINT Patient presents for Retina Follow Up   HISTORY OF PRESENT ILLNESS: Chad Morrison is a 86 y.o. 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  Duration of 5 weeks.  Since onset it is stable.          Comments    5 week f.u - OCT OU, Poss Lucentis OD Patient denies change in vision and overall has no complaints.        Last edited by Gerda Diss on 09/19/2019 10:19 AM. (History)      Referring physician: Haywood Pao, MD [place],  [place] [address]  HISTORICAL INFORMATION:   Selected notes from the MEDICAL RECORD NUMBER       CURRENT MEDICATIONS: Current Outpatient Medications (Ophthalmic Drugs)  Medication Sig  . Bromfenac Sodium (PROLENSA) 0.07 % SOLN Apply 1 drop to eye daily.   No current facility-administered medications for this visit. (Ophthalmic Drugs)   Current Outpatient Medications (Other)  Medication Sig  . albuterol (VENTOLIN HFA) 108 (90 Base) MCG/ACT inhaler USE 1 TO 2 INHALATIONS     ORALLY EVERY 6 HOURS AS    NEEDED FOR WHEEZING OR     SHORTNESS OF BREATH  . ALPRAZolam (XANAX) 0.25 MG tablet Take 0.25 mg by mouth at bedtime as needed for sleep.   . Ascorbic Acid (VITAMIN C PO) Take by mouth. Not sure the dosage  . azelastine (ASTELIN) 0.1 % nasal spray Place 1 spray into both nostrils 2 (two) times daily. Use in each nostril as directed  . budesonide-formoterol (SYMBICORT) 160-4.5 MCG/ACT inhaler Inhale 2 puffs into the lungs in the morning and at bedtime.  . Calcium Carbonate-Vitamin D (CALCIUM-VITAMIN D) 600-125 MG-UNIT TABS Take by mouth 2 (two) times daily.  Marland Kitchen diltiazem (CARDIZEM CD) 120 MG 24 hr capsule Take 1 capsule (120 mg total) by mouth 2 (two) times daily.  . diphenhydrAMINE (BENADRYL) 25 MG tablet Take 50 mg by mouth at bedtime as needed.   . doxycycline (VIBRA-TABS) 100 MG tablet Take 1 tablet (100 mg total) by  mouth 2 (two) times daily.  Marland Kitchen ELIQUIS 5 MG TABS tablet TAKE 1 TABLET TWICE A DAY  . FEROSUL 325 (65 Fe) MG tablet Take 325 mg by mouth daily.  . folic acid (FOLVITE) 1 MG tablet Take 2 mg by mouth daily.   Marland Kitchen levocetirizine (XYZAL) 5 MG tablet Take 1 tablet (5 mg total) by mouth every evening.  Marland Kitchen levothyroxine (SYNTHROID, LEVOTHROID) 75 MCG tablet Take 75 mcg by mouth daily before breakfast.  . methotrexate 2.5 MG tablet 6 tabs weekly  . montelukast (SINGULAIR) 10 MG tablet Take 1 tablet (10 mg total) by mouth at bedtime.  . Multiple Vitamin (MULTIVITAMIN WITH MINERALS) TABS tablet Take 1 tablet daily by mouth.  . Multiple Vitamins-Minerals (OCUVITE PRESERVISION PO) Take 1 tablet 2 (two) times daily by mouth.  . Multiple Vitamins-Minerals (ZINC PO) Take by mouth. Not sure the dosage  . pantoprazole (PROTONIX) 40 MG tablet Take 40 mg by mouth daily.  . simvastatin (ZOCOR) 10 MG tablet Take 10 mg by mouth every evening.    No current facility-administered medications for this visit. (Other)      REVIEW OF SYSTEMS:    ALLERGIES Allergies  Allergen Reactions  . Penicillins Hives    Has patient had a PCN reaction causing  immediate rash, facial/tongue/throat swelling, SOB or lightheadedness with hypotension: No Has patient had a PCN reaction causing severe rash involving mucus membranes or skin necrosis: No Has patient had a PCN reaction that required hospitalization: No Has patient had a PCN reaction occurring within the last 10 years: Yes If all of the above answers are "NO", then may proceed with Cephalosporin use.   . Sulfa Antibiotics Other (See Comments)    Unknown to patient   . Latex Hives and Rash    PAST MEDICAL HISTORY Past Medical History:  Diagnosis Date  . Anxiety   . Asthma   . Asthmatic bronchitis   . Atrial flutter (HCC)    chads2vasc score is 3  . Ehrlichiosis 4193  . GERD (gastroesophageal reflux disease)   . Granulomatosis with polyangiitis ([place])   . HTN  (hypertension)   . Hyperlipidemia   . Paroxysmal atrial fibrillation (HCC)   . Prostate cancer ([place])   . Seasonal allergies    Past Surgical History:  Procedure Laterality Date  . BASAL CELL CARCINOMA EXCISION  2013   EYELID  . HERNIA REPAIR Bilateral 2000, 2002  . KNEE ARTHROSCOPY  2000  . prostectomy  1995   RADICAL  . TONSILLECTOMY  1940    FAMILY HISTORY Family History  Problem Relation Age of Onset  . Asthma Mother 44  . Allergies Mother   . Ulcers Mother   . CVA Father 60  . Heart Problems Father   . Other Father        NEUROLOGIC ISSUES   . Other Brother 11       BICYCLE ACCIDENT  . Migraines Son   . Diabetes Mellitus I Son     SOCIAL HISTORY Social History   Tobacco Use  . Smoking status: Former Smoker    Packs/day: 0.50    Years: 4.00    Pack years: 2.00    Types: Cigarettes    Quit date: 02/28/1961    Years since quitting: 58.5  . Smokeless tobacco: Never Used  Vaping Use  . Vaping Use: Never used  Substance Use Topics  . Alcohol use: Yes    Alcohol/week: 2.0 - 3.0 standard drinks    Types: 2 - 3 Standard drinks or equivalent per week  . Drug use: No         OPHTHALMIC EXAM:  Base Eye Exam    Visual Acuity (Snellen - Linear)      Right Left   Dist [place] 20/40+1 20/30-2   Dist ph [place] NI NI       Tonometry (Tonopen, 10:28 AM)      Right Left   Pressure 14 16       Pupils      Pupils Dark Light Shape React APD   Right PERRL 5 4 Irregular Slow None   Left PERRL 4 3 Round Slow None       Visual Fields (Counting fingers)      Left Right    Full Full       Extraocular Movement      Right Left    Full Full       Neuro/Psych    Oriented x3: Yes   Mood/Affect: Normal       Dilation    Right eye: 1.0% Mydriacyl, 2.5% Phenylephrine @ 10:28 AM        Slit Lamp and Fundus Exam    External Exam      Right Left   External Normal Normal  Slit Lamp Exam      Right Left   Lids/Lashes Normal Normal   Conjunctiva/Sclera  White and quiet White and quiet   Cornea Clear Clear   Anterior Chamber Deep and quiet Deep and quiet   Iris Round and reactive Round and reactive   Lens Posterior chamber intraocular lens 3+ Nuclear sclerosis   Anterior Vitreous Normal Normal       Fundus Exam      Right Left   Posterior Vitreous Posterior vitreous detachment    Disc Normal    C/D Ratio 0.65    Macula Cystoid macular edema, Soft drusen in the FAZ, Epiretinal membrane    Vessels Normal    Periphery Normal           IMAGING AND PROCEDURES  Imaging and Procedures for 09/19/19  OCT, Retina - OU - Both Eyes       Right Eye Quality was good. Scan locations included subfoveal. Central Foveal Thickness: 445. Progression has improved. Findings include cystoid macular edema, subretinal hyper-reflective material, intraretinal fluid.   Left Eye Quality was good. Scan locations included subfoveal. Central Foveal Thickness: 348. Progression has been stable.   Notes Dense subretinal subfoveal nodule of disease, overlying intraretinal fluid and CME OD       Intravitreal Injection, Pharmacologic Agent - OD - Right Eye       Time Out 09/19/2019. 11:11 AM. Confirmed correct patient, procedure, site, and patient consented.   Anesthesia Topical anesthesia was used. Anesthetic medications included Akten 3.5%.   Procedure Preparation included Ofloxacin , 10% betadine to eyelids, 5% betadine to ocular surface.   Injection:  0.5 mg ranibizumab (LUCENTIS) 0.5 MG/0.05ML intravitreal injection   NDC: 50242-080-01, Lot: [id number]   Route: Intravitreal, Site: Right Eye, Waste: 0 mg  Post-op Post injection exam found visual acuity of at least counting fingers. The patient tolerated the procedure well. There were no complications. The patient received written and verbal post procedure care education.                 ASSESSMENT/PLAN:  No problem-specific Assessment & Plan notes found for this encounter.       ICD-10-CM   1. Exudative age-related macular degeneration of right eye with active choroidal neovascularization (HCC)  H35.3211 OCT, Retina - OU - Both Eyes    Intravitreal Injection, Pharmacologic Agent - OD - Right Eye    ranibizumab (LUCENTIS) 0.5 MG/0.05ML intravitreal injection 0.5 mg    1.  Retreat today for chronic active CME associated with wet AMD.  2.  Kathleen Argue syndrome OD, status post recent YAG laser photo disruption of vitreous strand membrane to the corneal wound  3.  Repeat examination OD in 6 weeks  Ophthalmic Meds Ordered this visit:  Meds ordered this encounter  Medications  . ranibizumab (LUCENTIS) 0.5 MG/0.05ML intravitreal injection 0.5 mg       Return in about 6 weeks (around 10/31/2019) for dilate, OD, LUCENTIS 0.5 OCT.  There are no Patient Instructions on file for this visit.   Explained the diagnoses, plan, and follow up with the patient and they expressed understanding.  Patient expressed understanding of the importance of proper follow up care.   Clent Demark Mayola Mcbain M.D. Diseases & Surgery of the Retina and Vitreous [facility] 09/19/19     Abbreviations: M myopia (nearsighted); A astigmatism; H hyperopia (farsighted); P presbyopia; Mrx spectacle prescription;  CTL contact lenses; OD right eye; OS left eye; OU both eyes  XT  exotropia; ET esotropia; PEK punctate epithelial keratitis; PEE punctate epithelial erosions; DES dry eye syndrome; MGD meibomian gland dysfunction; ATs artificial tears; PFAT's preservative free artificial tears; [place] nuclear sclerotic cataract; PSC posterior subcapsular cataract; ERM epi-retinal membrane; PVD posterior vitreous detachment; RD retinal detachment; DM diabetes mellitus; DR diabetic retinopathy; NPDR non-proliferative diabetic retinopathy; PDR proliferative diabetic retinopathy; CSME clinically significant macular edema; DME diabetic macular edema; dbh dot blot hemorrhages; CWS cotton wool spot; POAG  primary open angle glaucoma; C/D cup-to-disc ratio; HVF humphrey visual field; GVF goldmann visual field; OCT optical coherence tomography; IOP intraocular pressure; BRVO Branch retinal vein occlusion; CRVO central retinal vein occlusion; CRAO central retinal artery occlusion; BRAO branch retinal artery occlusion; RT retinal tear; SB scleral buckle; PPV pars plana vitrectomy; VH Vitreous hemorrhage; PRP panretinal laser photocoagulation; IVK intravitreal kenalog; VMT vitreomacular traction; MH Macular hole;  NVD neovascularization of the disc; NVE neovascularization elsewhere; AREDS age related eye disease study; ARMD age related macular degeneration; POAG primary open angle glaucoma; EBMD epithelial/anterior basement membrane dystrophy; ACIOL anterior chamber intraocular lens; IOL intraocular lens; PCIOL posterior chamber intraocular lens; Phaco/IOL phacoemulsification with intraocular lens placement; Waterproof photorefractive keratectomy; LASIK laser assisted in situ keratomileusis; HTN hypertension; DM diabetes mellitus; COPD chronic obstructive pulmonary disease

## 2019-09-26 ENCOUNTER — Encounter: Payer: Self-pay | Admitting: Primary Care

## 2019-09-26 ENCOUNTER — Telehealth: Payer: Self-pay | Admitting: Primary Care

## 2019-09-26 MED ORDER — PREDNISONE 10 MG PO TABS: ORAL_TABLET | ORAL | 0 refills | Status: DC

## 2019-09-26 NOTE — Telephone Encounter (Signed)
09/26/2019  Okay to send in  Prednisone 10mg  tablet  >>>4 tabs for 2 days, then 3 tabs for 2 days, 2 tabs for 2 days, then 1 tab for 2 days, then stop >>>take with food  >>>take in the morning    Please make sure patient is adherent to the medications on his medication list.  I would also recommend if he has chronic rhinitis that he start doing nasal saline rinses prior to any nasal medications.  Those instructions are listed below:  Start nasal saline rinses twice daily Use distilled water Shake well Get bottle lukewarm like a baby bottle  He can purchase these over-the-counter from [employer].  If symptoms are not improving I would recommend that he schedule a closer follow-up.  Since he is a former Dr. Lake Bells patient we could get him scheduled for a 30-minute consult slot with Dr. Silas Flood.   Wyn Quaker, FNP

## 2019-09-26 NOTE — Telephone Encounter (Signed)
Spoke with pt. He is aware of Brian's recommendations. Rx for prednisone has been sent in. Nothing further was needed.

## 2019-09-26 NOTE — Telephone Encounter (Signed)
Spoke with pt. States that he is still having issues with coughing. Cough is producing clear mucus but he feels this is coming from his sinuses. Denies chest tightness, wheezing, shortness of breath or fever. States when he was here last, Eustaquio Maize said that he might need to having a dose of prednisone to get rid of his cough. Pt is now requesting to have that sent in.  Aaron Edelman - please advise.  Thanks.

## 2019-09-30 ENCOUNTER — Other Ambulatory Visit: Payer: Self-pay

## 2019-09-30 ENCOUNTER — Ambulatory Visit (HOSPITAL_COMMUNITY)
Admission: RE | Admit: 2019-09-30 | Discharge: 2019-09-30 | Disposition: A | Discharge status: Home / Self Care | Payer: Medicare Other | Source: Ambulatory Visit | Attending: Physician Assistant | Admitting: Physician Assistant

## 2019-09-30 VITALS — BP 136/66 | HR 109 | Ht 69.0 in | Wt 146.0 lb

## 2019-09-30 DIAGNOSIS — I10 Essential (primary) hypertension: Secondary | ICD-10-CM | POA: Insufficient documentation

## 2019-09-30 DIAGNOSIS — Z8546 Personal history of malignant neoplasm of prostate: Secondary | ICD-10-CM | POA: Insufficient documentation

## 2019-09-30 DIAGNOSIS — K219 Gastro-esophageal reflux disease without esophagitis: Secondary | ICD-10-CM | POA: Diagnosis not present

## 2019-09-30 DIAGNOSIS — Z96659 Presence of unspecified artificial knee joint: Secondary | ICD-10-CM | POA: Diagnosis not present

## 2019-09-30 DIAGNOSIS — Z7901 Long term (current) use of anticoagulants: Secondary | ICD-10-CM | POA: Insufficient documentation

## 2019-09-30 DIAGNOSIS — I4819 Other persistent atrial fibrillation: Secondary | ICD-10-CM | POA: Diagnosis not present

## 2019-09-30 DIAGNOSIS — Z9079 Acquired absence of other genital organ(s): Secondary | ICD-10-CM | POA: Insufficient documentation

## 2019-09-30 DIAGNOSIS — Z79899 Other long term (current) drug therapy: Secondary | ICD-10-CM | POA: Diagnosis not present

## 2019-09-30 DIAGNOSIS — M3130 Wegener's granulomatosis without renal involvement: Secondary | ICD-10-CM | POA: Insufficient documentation

## 2019-09-30 DIAGNOSIS — J45909 Unspecified asthma, uncomplicated: Secondary | ICD-10-CM | POA: Insufficient documentation

## 2019-09-30 DIAGNOSIS — F419 Anxiety disorder, unspecified: Secondary | ICD-10-CM | POA: Insufficient documentation

## 2019-09-30 DIAGNOSIS — E785 Hyperlipidemia, unspecified: Secondary | ICD-10-CM | POA: Diagnosis not present

## 2019-09-30 DIAGNOSIS — D6869 Other thrombophilia: Secondary | ICD-10-CM | POA: Diagnosis not present

## 2019-09-30 DIAGNOSIS — I350 Nonrheumatic aortic (valve) stenosis: Secondary | ICD-10-CM | POA: Diagnosis not present

## 2019-09-30 NOTE — Patient Instructions (Signed)
Call Friday or Monday with update of heart rates

## 2019-09-30 NOTE — Progress Notes (Signed)
Primary Care Physician: Tisovec, Fransico Him, MD Referring Physician: Dr. Rosalyn Charters Stave is a 86 y.o. male with a h/o typical atrial flutter, on amiodarone for evaluation in the afib clinic. He saw Dr. [name] in August and was c/o of shortness of breath. There was concern of amiodarone toxicity and pt asked to reduce amio to 100 mg a day. He had refused typical atrial flutter ablation in the past. He saw pulmonology and found to be in rapid atrial fibrillation. Dr. [name]  was notified  and he was asked to have f/u here. He increased his amio back to 200 mg. Pulmonology is planning to repeat PFT's when his heart rate is controlled. 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 Pulmonology  questioned if lungs were also involved contributing to increased shortness of breath. He saw Dr. [name] 10/10 Amiodarone was stopped and Multaq was started. He is on Eliquis for a CHADS2VASC score of 3.   On follow up today, patient reports that he has done well since his last visit. He has had more issues with allergies recently and has increased his inhaler use and has been started on a prednisone taper. He states that his breathing is much improved today. He continues to have no awareness of his arrhythmia. His heart rate at home generally stays between 70s-low 90s.   Today, he denies symptoms of palpitations, chest pain, orthopnea, PND, lower extremity edema, dizziness, presyncope, syncope, or neurologic sequela. The patient is tolerating medications without difficulties and is otherwise without complaint today.  Past Medical History:  Diagnosis Date  . Anxiety   . Asthma   . Asthmatic bronchitis   . Atrial flutter (HCC)    chads2vasc score is 3  . Ehrlichiosis 9628  . GERD (gastroesophageal reflux disease)   . 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 HTN (hypertension)   . Hyperlipidemia   . Paroxysmal atrial fibrillation (HCC)   . Prostate cancer ([place])   . Seasonal  allergies    Past Surgical History:  Procedure Laterality Date  . BASAL CELL CARCINOMA EXCISION  2013   EYELID  . HERNIA REPAIR Bilateral 2000, 2002  . KNEE ARTHROSCOPY  2000  . prostectomy  1995   RADICAL  . TONSILLECTOMY  1940    Current Outpatient Medications  Medication Sig Dispense Refill  . albuterol (VENTOLIN HFA) 108 (90 Base) MCG/ACT inhaler USE 1 TO 2 INHALATIONS     ORALLY EVERY 6 HOURS AS    NEEDED FOR WHEEZING OR     SHORTNESS OF BREATH 54 g 3  . ALPRAZolam (XANAX) 0.25 MG tablet Take 0.25 mg by mouth at bedtime as needed for sleep.     . Ascorbic Acid (VITAMIN C PO) Take by mouth. Not sure the dosage    . azelastine (ASTELIN) 0.1 % nasal spray Place 1 spray into both nostrils 2 (two) times daily. Use in each nostril as directed (Patient taking differently: Place 1 spray into both nostrils daily. Use in each nostril as directed) 30 mL 1  . Bromfenac Sodium (PROLENSA) 0.07 % SOLN Apply 1 drop to eye daily.    . budesonide-formoterol (SYMBICORT) 160-4.5 MCG/ACT inhaler Inhale 2 puffs into the lungs in the morning and at bedtime. 3 Inhaler 3  . Calcium Carbonate-Vitamin D (CALCIUM-VITAMIN D) 600-125 MG-UNIT TABS Take 1 tablet by mouth 2 (two) times daily.     Marland Kitchen diltiazem (CARDIZEM CD) 120 MG 24 hr capsule Take 1 capsule (120 mg total)  by mouth 2 (two) times daily. 60 capsule 3  . diphenhydrAMINE (BENADRYL) 25 MG tablet Take 50 mg by mouth at bedtime as needed.     Marland Kitchen ELIQUIS 5 MG TABS tablet TAKE 1 TABLET TWICE A DAY 180 tablet 1  . FEROSUL 325 (65 Fe) MG tablet Take 325 mg by mouth daily.    . folic acid (FOLVITE) 1 MG tablet Take 2 mg by mouth daily.     Marland Kitchen levocetirizine (XYZAL) 5 MG tablet Take 1 tablet (5 mg total) by mouth every evening. 90 tablet 3  . levothyroxine (SYNTHROID, LEVOTHROID) 75 MCG tablet Take 75 mcg by mouth daily before breakfast.    . methotrexate 2.5 MG tablet 6 tabs weekly    . montelukast (SINGULAIR) 10 MG tablet Take 1 tablet (10 mg total) by mouth at  bedtime. 30 tablet 0  . Multiple Vitamin (MULTIVITAMIN WITH MINERALS) TABS tablet Take 1 tablet daily by mouth.    . Multiple Vitamins-Minerals (OCUVITE PRESERVISION PO) Take 1 tablet 2 (two) times daily by mouth.    . pantoprazole (PROTONIX) 40 MG tablet Take 40 mg by mouth daily.    . predniSONE (DELTASONE) 10 MG tablet Take 4 tablets for 2 days, 3 tablets for 2 days, 2 tablets for 2 days, 1 tablet for 2 days then stop 20 tablet 0  . simvastatin (ZOCOR) 10 MG tablet Take 10 mg by mouth every evening.      No current facility-administered medications for this encounter.    Allergies  Allergen Reactions  . Penicillins Hives    Has patient had a PCN reaction causing immediate rash, facial/tongue/throat swelling, SOB or lightheadedness with hypotension: No Has patient had a PCN reaction causing severe rash involving mucus membranes or skin necrosis: No Has patient had a PCN reaction that required hospitalization: No Has patient had a PCN reaction occurring within the last 10 years: Yes If all of the above answers are "NO", then may proceed with Cephalosporin use.   . Sulfa Antibiotics Other (See Comments)    Unknown to patient   . Latex Hives and Rash    Social History   Socioeconomic History  . Marital status: Married    Spouse name: [name]  . Number of children: 2  . Years of education: Not on file  . Highest education level: Not on file  Occupational History  . Occupation: RETIRED  Tobacco Use  . Smoking status: Former Smoker    Packs/day: 0.50    Years: 4.00    Pack years: 2.00    Types: Cigarettes    Quit date: 02/28/1961    Years since quitting: 58.6  . Smokeless tobacco: Never Used  Vaping Use  . Vaping Use: Never used  Substance and Sexual Activity  . Alcohol use: Yes    Alcohol/week: 2.0 - 3.0 standard drinks    Types: 2 - 3 Standard drinks or equivalent per week  . Drug use: No  . Sexual activity: Not on file  Other Topics Concern  . Not on file  Social  History Narrative   Pt recently moved to [place] from [place] to be near his son.   Retired.   Social Determinants of Health   Financial Resource Strain:   . Difficulty of Paying Living Expenses:   Food Insecurity:   . Worried About Charity fundraiser in the Last Year:   . Arboriculturist in the Last Year:   Transportation Needs:   . Lack of Transportation (  Medical):   Marland Kitchen Lack of Transportation (Non-Medical):   Physical Activity:   . Days of Exercise per Week:   . Minutes of Exercise per Session:   Stress:   . Feeling of Stress :   Social Connections:   . Frequency of Communication with Friends and Family:   . Frequency of Social Gatherings with Friends and Family:   . Attends Religious Services:   . Active Member of Clubs or Organizations:   . Attends Archivist Meetings:   Marland Kitchen Marital Status:   Intimate Partner Violence:   . Fear of Current or Ex-Partner:   . Emotionally Abused:   Marland Kitchen Physically Abused:   . Sexually Abused:     Family History  Problem Relation Age of Onset  . Asthma Mother 7  . Allergies Mother   . Ulcers Mother   . CVA Father 24  . Heart Problems Father   . Other Father        NEUROLOGIC ISSUES   . Other Brother 11       BICYCLE ACCIDENT  . Migraines Son   . Diabetes Mellitus I Son     ROS- All systems are reviewed and negative except as per the HPI above  Physical Exam: Vitals:   09/30/19 1114  BP: 136/66  Pulse: (!) 109  Weight: 66.2 kg  Height: 5\' 9"  (1.753 m)   Wt Readings from Last 3 Encounters:  09/30/19 66.2 kg  09/16/19 66.5 kg  09/04/19 66.8 kg    Labs: Lab Results  Component Value Date   NA 139 09/16/2019   K 3.7 09/16/2019   CL 105 09/16/2019   CO2 24 09/16/2019   GLUCOSE 120 (H) 09/16/2019   BUN 18 09/16/2019   CREATININE 1.36 (H) 09/16/2019   CALCIUM 9.4 09/16/2019   No results found for: INR No results found for: CHOL, HDL, LDLCALC, TRIG   GEN- The patient is well appearing elderly male, alert  and oriented x 3 today.   HEENT-head normocephalic, atraumatic, sclera clear, conjunctiva pink, hearing intact, trachea midline. Lungs- Clear to ausculation bilaterally, normal work of breathing Heart- irregular rate and rhythm, no murmurs, rubs or gallops  GI- soft, NT, ND, + BS Extremities- no clubbing, cyanosis, or edema MS- no significant deformity or atrophy Skin- no rash or lesion Psych- euthymic mood, full affect Neuro- strength and sensation are intact   EKG- afib HR 109, RBBB, QRS 150, QTc 511  Epic records reviewed  Echo 10/23/17 - Left ventricle: The cavity size was normal. There was mild   concentric hypertrophy. Systolic function was normal. The   estimated ejection fraction was in the range of 60% to 65%. Wall   motion was normal; there were no regional wall motion   abnormalities. Features are consistent with a pseudonormal left   ventricular filling pattern, with concomitant abnormal relaxation   and increased filling pressure (grade 2 diastolic dysfunction).   Doppler parameters are consistent with indeterminate ventricular   filling pressure. - Aortic valve: Valve mobility was restricted. There was moderate   stenosis. - Aorta: Ascending aortic diameter: 37 mm (S). - Ascending aorta: The ascending aorta was mildly dilated. - Mitral valve: Transvalvular velocity was within the normal range.   There was no evidence for stenosis. There was mild regurgitation. - Left atrium: The atrium was severely dilated. - Right ventricle: The cavity size was normal. Wall thickness was   normal. Systolic function was normal. - Right atrium: The atrium was mildly dilated. -  Atrial septum: No defect or patent foramen ovale was identified   by color flow Doppler. - Tricuspid valve: There was mild regurgitation. - Pulmonary arteries: Systolic pressure was within the normal   range. PA peak pressure: 35 mm Hg (S).   Assessment and Plan: 1. Persistent atrial fibrillation/typical  atrial flutter Multaq discontinued at previous visit in favor of a rate control strategy.  ? If elevated heart rate related to increased inhaler/decongestant use. Patient to monitor heart rate over the next week with Kardia and call back to clinic. If his heart rate remains elevated, would uptitrate diltiazem. Continue cardizem 120 mg BID for now. 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   3. Aortic Stenosis Moderate by echo 12/20/18.   Follow up in the AF clinic in 3 months. Sooner if heart rate is not well controlled.    [facility] [address] [place], [place] [address] [phone]

## 2019-10-02 ENCOUNTER — Other Ambulatory Visit: Payer: Self-pay | Admitting: Adult Health

## 2019-10-07 ENCOUNTER — Telehealth (HOSPITAL_COMMUNITY): Payer: Self-pay

## 2019-10-07 MED ORDER — ROSUVASTATIN CALCIUM 5 MG PO TABS
5.0000 mg | ORAL_TABLET | Freq: Every day | ORAL | 0 refills | Status: DC
Start: 2019-10-07 — End: 2019-10-11

## 2019-10-07 MED ORDER — DILTIAZEM HCL ER COATED BEADS 180 MG PO CP24: 180.0000 mg | ORAL_CAPSULE | Freq: Two times a day (BID) | ORAL | 0 refills | Status: DC

## 2019-10-07 NOTE — Telephone Encounter (Signed)
Patient called in regarding his heart rate readings. His heart rate has ranged from 115-120 when he is sitting or if he is exercising. Advised patient to discontinue the Diltiazem 120mg  tablet and the Simvastain 10mg  daily. Per Audry Pili Fenton-PA start Diltiazem 180mg  twice daily and Crestor 5mg  once daily and he was told to contact our clinic back on Thursday or Friday with his blood pressure and heart rate readings. He is scheduled for follow up in 3-4 weeks to see [employer].  Patient verbalized understanding.

## 2019-10-11 ENCOUNTER — Telehealth (HOSPITAL_COMMUNITY): Payer: Self-pay

## 2019-10-11 MED ORDER — DILTIAZEM HCL ER COATED BEADS 180 MG PO CP24: 180.0000 mg | ORAL_CAPSULE | Freq: Two times a day (BID) | ORAL | 1 refills | Status: DC

## 2019-10-11 MED ORDER — ROSUVASTATIN CALCIUM 5 MG PO TABS
5.0000 mg | ORAL_TABLET | Freq: Every day | ORAL | 1 refills | Status: DC
Start: 2019-10-11 — End: 2020-06-16

## 2019-10-11 NOTE — Telephone Encounter (Signed)
Patient called in with his Heart rates and Blood Pressures. B/P- 135/66 and he had a reading of 170/80. Heart rates have been ranging in the 70-80s and once was 95. He is feeling much better. Sent in his Diltiazem 180mg  BID and Crestor 5mg  once daily- with a 6 month supply to [facility]. He will contact us back if he has any other problems or concerns. Consulted with patient and he verbalized understanding.

## 2019-10-17 ENCOUNTER — Other Ambulatory Visit: Payer: Self-pay | Admitting: Internal Medicine

## 2019-10-17 MED ORDER — APIXABAN 5 MG PO TABS: 5.0000 mg | ORAL_TABLET | Freq: Two times a day (BID) | ORAL | 1 refills | Status: DC

## 2019-10-17 NOTE — Telephone Encounter (Signed)
*  STAT* If patient is at the pharmacy, call can be transferred to refill team.   1. Which medications need to be refilled? (please list name of each medication and dose if known)  ELIQUIS 5 MG TABS tablet  2. Which pharmacy/location (including street and city if local pharmacy) is medication to be sent to? [facility] [address], [place] AT [facility]  3. Do they need a 30 day or 90 day supply? 90 with 3 refills

## 2019-10-17 NOTE — Telephone Encounter (Signed)
Pt last saw Clint Fenton, PA 09/30/19, last labs 09/16/19 Creat 1.36, age 86, weight 66.2kg, based on specified criteria pt is on appropriate dosage of Eliquis 5mg  BID.  Will refill rx.

## 2019-10-22 ENCOUNTER — Other Ambulatory Visit: Payer: Self-pay

## 2019-10-22 ENCOUNTER — Encounter: Payer: Self-pay | Admitting: Allergy and Immunology

## 2019-10-22 ENCOUNTER — Ambulatory Visit (INDEPENDENT_AMBULATORY_CARE_PROVIDER_SITE_OTHER): Payer: Medicare Other | Admitting: Allergy and Immunology

## 2019-10-22 VITALS — BP 146/92 | HR 96 | Resp 18 | Ht 69.0 in | Wt 148.0 lb

## 2019-10-22 DIAGNOSIS — M3130 Wegener's granulomatosis without renal involvement: Secondary | ICD-10-CM | POA: Diagnosis not present

## 2019-10-22 DIAGNOSIS — D801 Nonfamilial hypogammaglobulinemia: Secondary | ICD-10-CM

## 2019-10-22 DIAGNOSIS — J324 Chronic pansinusitis: Secondary | ICD-10-CM

## 2019-10-22 DIAGNOSIS — D849 Immunodeficiency, unspecified: Secondary | ICD-10-CM | POA: Diagnosis not present

## 2019-10-22 DIAGNOSIS — J3489 Other specified disorders of nose and nasal sinuses: Secondary | ICD-10-CM

## 2019-10-22 DIAGNOSIS — J3089 Other allergic rhinitis: Secondary | ICD-10-CM | POA: Diagnosis not present

## 2019-10-22 DIAGNOSIS — K219 Gastro-esophageal reflux disease without esophagitis: Secondary | ICD-10-CM | POA: Diagnosis not present

## 2019-10-22 MED ORDER — BUDESONIDE 0.5 MG/2ML IN SUSP
0.5000 mg | Freq: Two times a day (BID) | RESPIRATORY_TRACT | 1 refills | Status: DC
Start: 2019-10-22 — End: 2019-10-29

## 2019-10-22 MED ORDER — BUDESONIDE-FORMOTEROL FUMARATE 160-4.5 MCG/ACT IN AERO
2.0000 | INHALATION_SPRAY | Freq: Two times a day (BID) | RESPIRATORY_TRACT | 1 refills | Status: DC
Start: 2019-10-22 — End: 2020-06-22

## 2019-10-22 NOTE — Progress Notes (Addendum)
[facility] [place]   Dear Chad Morrison,  Thank you for referring Chad Morrison to the [facility] on 10/22/2019.   Below is a summation of this patient's evaluation and recommendations.  Thank you for your referral. I will keep you informed about this patient's response to treatment.   If you have any questions please do not hesitate to contact me.   Sincerely,  Jiles Prows, MD Allergy / Immunology [place]   ______________________________________________________________________    NEW PATIENT NOTE  Referring Provider: Martyn Ehrich, NP Primary Provider: Haywood Pao, MD Date of office visit: 10/22/2019    Subjective:   Chief Complaint:  Chad Morrison (DOB: 06-08-1932) is a 86 y.o. male who presents to the clinic on 10/22/2019 with a chief complaint of Allergic Rhinitis  and Immunodeficiency .     HPI: Gene presents to this clinic in evaluation of respiratory tract symptoms.  Apparently his history starts many decades ago at which point in time he had "aortitis "requiring prednisone for a prolonged period in time.  Then, approximately 10 years ago he started to develop issues with nasal congestion and sneezing and classic allergic symptoms of his upper airway for which he underwent a course of immunotherapy which resulted in resolution of this issue for which he discontinued that form of therapy in 2018.  He then moved from [place] to [place] and developed Wegener's granulomatosis manifested predominantly as an upper airway issue requiring surgical debridement of his upper airway, prolonged prednisone use for 6 months which he discontinued sometime pre-Covid, and methotrexate which he continues on today.  The symptoms he describes appear to be getting somewhat worse as he is tapering off his methotrexate.  He complains of having  nasal congestion without any sneezing but clear rhinorrhea without any ugly nasal discharge, lots of throat clearing, mucus stuck in his throat, coughing, and "heavy breathing".  He gets out of breath if he walks very fast.  Sometimes stairs can be a burden.  This occurs even though he has been using Symbicort and montelukast and nasal irrigation twice a day.  He is currently using a short acting bronchodilator on a daily basis for this "heavy breathing".  In addition, he has lots of burping at nighttime and must drink a Coke to "get it out".  He also drinks coffee in the morning.  He is currently taking Nexium at around 5 PM in the afternoon.  In the past he has developed epistaxis from the use of Nasacort.  Currently he is under the care of various medical specialists including the rheumatology group at [facility] who is monitoring his methotrexate use and has seen our local pulmonology group.  He still occasionally requires pulses of steroids for respiratory track symptomatology that flares just a few times a year mostly lower airway issues that do not appear to be infectious in nature.  He has obtained 2 Covid vaccinations and is scheduled for a booster next week.  Past Medical History:  Diagnosis Date  . Anxiety   . Asthma   . Asthmatic bronchitis   . Atrial flutter (HCC)    chads2vasc score is 3  . Ehrlichiosis 8250  . GERD (gastroesophageal reflux disease)   . Granulomatosis with polyangiitis ([place])   . HTN (hypertension)   . Hyperlipidemia   . Paroxysmal atrial fibrillation (HCC)   . Prostate cancer (  [place])   . Seasonal allergies     Past Surgical History:  Procedure Laterality Date  . BASAL CELL CARCINOMA EXCISION  2013   EYELID  . HERNIA REPAIR Bilateral 2000, 2002  . KNEE ARTHROSCOPY  2000  . prostectomy  1995   RADICAL  . TONSILLECTOMY  1940    Allergies as of 10/22/2019      Reactions   Penicillins Hives   Has patient had a PCN reaction causing immediate rash,  facial/tongue/throat swelling, SOB or lightheadedness with hypotension: No Has patient had a PCN reaction causing severe rash involving mucus membranes or skin necrosis: No Has patient had a PCN reaction that required hospitalization: No Has patient had a PCN reaction occurring within the last 10 years: Yes If all of the above answers are "NO", then may proceed with Cephalosporin use.   Sulfa Antibiotics Other (See Comments)   Unknown to patient   Latex Hives, Rash      Medication List    albuterol 108 (90 Base) MCG/ACT inhaler Commonly known as: VENTOLIN HFA USE 1 TO 2 INHALATIONS     ORALLY EVERY 6 HOURS AS    NEEDED FOR WHEEZING OR     SHORTNESS OF BREATH   ALPRAZolam 0.25 MG tablet Commonly known as: XANAX Take 0.25 mg by mouth at bedtime as needed for sleep.   apixaban 5 MG Tabs tablet Commonly known as: Eliquis Take 1 tablet (5 mg total) by mouth 2 (two) times daily.   azelastine 0.1 % nasal spray Commonly known as: ASTELIN Place 1 spray into both nostrils 2 (two) times daily. Use in each nostril as directed   budesonide-formoterol 160-4.5 MCG/ACT inhaler Commonly known as: Symbicort Inhale 2 puffs into the lungs in the morning and at bedtime.   Calcium-Vitamin D 600-125 MG-UNIT Tabs Take 1 tablet by mouth 2 (two) times daily.   diltiazem 180 MG 24 hr capsule Commonly known as: Cardizem CD Take 1 capsule (180 mg total) by mouth in the morning and at bedtime.   diphenhydrAMINE 25 MG tablet Commonly known as: BENADRYL Take 50 mg by mouth at bedtime as needed.   FeroSul 325 (65 FE) MG tablet Generic drug: ferrous sulfate Take 325 mg by mouth daily.   folic acid 1 MG tablet Commonly known as: FOLVITE Take 2 mg by mouth daily.   levocetirizine 5 MG tablet Commonly known as: XYZAL Take 1 tablet (5 mg total) by mouth every evening.   levothyroxine 75 MCG tablet Commonly known as: SYNTHROID Take 75 mcg by mouth daily before breakfast.   methotrexate 2.5 MG  tablet 6 tabs weekly   montelukast 10 MG tablet Commonly known as: SINGULAIR Take 1 tablet (10 mg total) by mouth at bedtime.   multivitamin with minerals Tabs tablet Take 1 tablet daily by mouth.   OCUVITE PRESERVISION PO Take 1 tablet 2 (two) times daily by mouth.   pantoprazole 40 MG tablet Commonly known as: PROTONIX Take 40 mg by mouth daily.   Prolensa 0.07 % Soln Generic drug: Bromfenac Sodium Apply 1 drop to eye daily.   rosuvastatin 5 MG tablet Commonly known as: Crestor Take 1 tablet (5 mg total) by mouth daily.   triamcinolone cream 0.1 % Commonly known as: KENALOG APPLY A SMALL AMOUNT TO SKIN TWICE DAILY   VITAMIN C PO Take by mouth. Not sure the dosage       Review of systems negative except as noted in HPI / PMHx or noted below:  Review of Systems  Constitutional: Negative.   HENT: Negative.   Eyes: Negative.   Respiratory: Negative.   Cardiovascular: Negative.   Gastrointestinal: Negative.   Genitourinary: Negative.   Musculoskeletal: Negative.   Skin: Negative.   Neurological: Negative.   Endo/Heme/Allergies: Negative.   Psychiatric/Behavioral: Negative.     Family History  Problem Relation Age of Onset  . Asthma Mother 57  . Allergies Mother   . Ulcers Mother   . CVA Father 49  . Heart Problems Father   . Other Father        NEUROLOGIC ISSUES   . Other Brother 11       BICYCLE ACCIDENT  . Migraines Son   . Diabetes Mellitus I Son     Social History   Socioeconomic History  . Marital status: Married    Spouse name: [name]  . Number of children: 2  . Years of education: Not on file  . Highest education level: Not on file  Occupational History  . Occupation: RETIRED  Tobacco Use  . Smoking status: Former Smoker    Packs/day: 0.50    Years: 4.00    Pack years: 2.00    Types: Cigarettes    Quit date: 02/28/1961    Years since quitting: 58.6  . Smokeless tobacco: Never Used  Vaping Use  . Vaping Use: Never used    Substance and Sexual Activity  . Alcohol use: Yes    Alcohol/week: 2.0 - 3.0 standard drinks    Types: 2 - 3 Standard drinks or equivalent per week  . Drug use: No  . Sexual activity: Not on file  Other Topics Concern  . Not on file  Social History Narrative   Pt recently moved to [place] from [place] to be near his son.   Retired.    Environmental and Social history  Lives in a house with a dry environment, no animals look inside the household, carpet in the bedroom, plastic on the bed, plastic on the pillow, no smoking ongoing with inside the household.  He did smoke from [age] at a rate of 1 pack/day.  Objective:   Vitals:   10/22/19 1015  BP: (!) 146/92  Pulse: 96  Resp: 18  SpO2: 94%   Height: 5\' 9"  (175.3 cm) Weight: 148 lb (67.1 kg)  Physical Exam Constitutional:      Appearance: He is not diaphoretic.     Comments: Raspy voice, constant throat clearing  HENT:     Head: Normocephalic. No right periorbital erythema or left periorbital erythema.     Right Ear: Tympanic membrane, ear canal and external ear normal.     Left Ear: Tympanic membrane, ear canal and external ear normal.     Nose: Nose normal. No mucosal edema (Septal perforation with clear borders) or rhinorrhea.     Mouth/Throat:     Pharynx: Uvula midline. No oropharyngeal exudate.  Eyes:     General: Lids are normal.     Conjunctiva/sclera: Conjunctivae normal.     Pupils: Pupils are equal, round, and reactive to light.  Neck:     Thyroid: No thyromegaly.     Trachea: Trachea normal. No tracheal tenderness or tracheal deviation.  Cardiovascular:     Rate and Rhythm: Normal rate and regular rhythm.     Heart sounds: Normal heart sounds, S1 normal and S2 normal. No murmur (Systolic) heard.   Pulmonary:     Effort: Pulmonary effort is normal. No respiratory distress.     Breath sounds:  Normal breath sounds. No stridor. No wheezing or rales.  Chest:     Chest wall: No tenderness.   Abdominal:     General: There is no distension.     Palpations: Abdomen is soft. There is no mass.     Tenderness: There is no abdominal tenderness. There is no guarding or rebound.  Musculoskeletal:        General: No tenderness.  Lymphadenopathy:     Head:     Right side of head: No tonsillar adenopathy.     Left side of head: No tonsillar adenopathy.     Cervical: No cervical adenopathy.  Skin:    Coloration: Skin is not pale.     Findings: No erythema or rash.     Nails: There is no clubbing.  Neurological:     Mental Status: He is alert.     Diagnostics: Allergy skin tests were not performed.   Spirometry was performed and demonstrated an FEV1 of 2.34 @ 93 % of predicted. FEV1/FVC = 0.67  Results of a chest x-ray obtained 04 September 2019 identified the following:  Areas of scarring in the lungs bilaterally. Peribronchial thickening. There is hyperinflation of the lungs compatible with COPD. Heart is normal size. No effusions or acute bony abnormality.  Results of a chest CT scan obtained 06 May 2019 identifies the following:  Cardiovascular: Mild cardiomegaly. No pericardial effusion.  No evidence of thoracic aortic aneurysm. Atherosclerotic calcifications of the aortic root/arch.  Three vessel coronary atherosclerosis.  Mediastinum/Nodes: No suspicious mediastinal lymphadenopathy.  Visualized thyroid is unremarkable.  Lungs/Pleura: Mild biapical pleural-parenchymal scarring.  Compressive atelectasis in the medial right lower lobe.  Bronchiectasis or bronchial wall thickening in the bilateral lower lobes. Linear scarring/atelectasis in the lingula. Scarring/atelectasis in the bilateral lower lobes.  6 mm nodule in the central left lower lobe (series 8/image 113), poorly evaluated due to motion degradation but grossly unchanged from 2019.  4 mm subpleural nodule in the anterior left upper lobe (series 8/image 34), unchanged since 2019.  Two  perifissural nodules along the right major fissure (series 8/image 91) and right minor fissure (series 8/image 101), measuring 3-4 mm, benign.  No pleural effusion or pneumothorax.  Results of a echocardiogram obtained 20 December 2018 identified the following:  1. Left ventricular ejection fraction, by visual estimation, is 60 to  65%. The left ventricle has normal function. There is no left ventricular  hypertrophy.  2. Global right ventricle has normal systolic function.The right  ventricular size is normal. No increase in right ventricular wall  thickness.  3. Left atrial size was normal.  4. Right atrial size was normal.  5. The mitral valve is normal in structure. Trace mitral valve  regurgitation.  6. The tricuspid valve is normal in structure. Tricuspid valve  regurgitation is mild.  7. The aortic valve is tricuspid Aortic valve regurgitation is trivial by  color flow Doppler. Moderate aortic valve stenosis.  8. There is Moderate calcification of the aortic valve.  9. There is Moderate thickening of the aortic valve.  10. The pulmonic valve was grossly normal. Pulmonic valve regurgitation is  not visualized by color flow Doppler.  11. Normal pulmonary artery systolic pressure.  12. The tricuspid regurgitant velocity is 2.40 m/s, and with an assumed  right atrial pressure of 3 mmHg, the estimated right ventricular systolic  pressure is normal at 25.9 mmHg.  13. The inferior vena cava is normal in size with greater than 50%  respiratory variability, suggesting right  atrial pressure of 3 mmHg.  Results of a VQ scan obtained 04 December 2017 identified the following:  Ventilation: Patchy ventilation of both lungs with poor ventilation of the upper lobes, corresponding to the changes of COPD on the chest radiographs.  Perfusion: No wedge shaped peripheral perfusion defects to suggest acute pulmonary embolism.  Results of blood tests obtained 22 April 2017  identified IgG 586 mg/DL, IgG subclass 1 300 mg/DL, IgG subclass 3 14.1 mg/DL, negative ANCA, negative antiproteinase 3 antibody.  Results of blood tests obtained 18 January 2018 identified WBC 10.9, absolute eosinophil 100, absolute lymphocyte 400, hemoglobin 9.7, platelet 247.  Assessment and Plan:    1. 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 Chronic pansinusitis   3. Nasal septal perforation   4. Perennial allergic rhinitis   5. LPRD (laryngopharyngeal reflux disease)   6. Hypogammaglobulinemia ([place])   7. Immunosuppression ([place])     1.  Treat and prevent inflammation:   A. Symbicort 160 - 2 inhalations 2 times per day (start with empty lungs)  B. Budesonide 0.5 ampules mixed in nasal saline two times per day  C. Montelukast 10 mg - 1 tablet 1 time per day  2. Treat and prevent reflux:   A. Minimize caffeine consumption   B. Increase Nexium 40 mg to 2 times per day  3. If needed:   A. albuterol HFA - 2 inhalations every 4-6 hours  B. OTC Antihistamine  4. Evaluation with ENT to examine throat / raspy voice  5. Blood - CBC w/diff, Area 2 aeroallergen profile, IgA/G/M, anti-pneumo ab, anti-Tetanus ab  6. Further evaluation and treatment?  7. Return to clinic in 4 weeks or earlier if problem  Gene has persistent respiratory tract symptoms and has a multitude of different respiratory tract issues that may be contributing to these symptoms.  I have made an attempt to address some of his upper airway symptomatology and possible LPR with the therapy noted above while he remains on good anti-inflammatory therapy for his lower airways.  I think he needs to have a ENT evaluation given his throat symptoms to see if he does have involvement of his glottic or subglottic area with Wegener's granulomatosis.  We will obtain blood tests in investigation of some of his previously identified blood abnormalities including his relative hypogammaglobulinemia,  investigation of atopic disease, and his immune systems ability to respond to common pathogens and immunogens.  I will contact him with the results of his blood test once they are available for review.  Jiles Prows, MD Allergy / Immunology [facility]

## 2019-10-22 NOTE — Patient Instructions (Addendum)
  1.  Treat and prevent inflammation:   A. Symbicort 160 - 2 inhalations 2 times per day (start with empty lungs)  B. Budesonide 0.5 ampules mixed in nasal saline two times per day  C. Montelukast 10 mg - 1 tablet 1 time per day  2. Treat and prevent reflux:   A. Minimize caffeine consumption   B. Increase Nexium 40 mg to 2 times per day  3. If needed:   A. albuterol HFA - 2 inhalations every 4-6 hours  B. OTC Antihistamine  4. Evaluation with ENT to examine throat / raspy voice  5. Blood - CBC w/diff, Area 2 aeroallergen profile, IgA/G/M, anti-pneumo ab, anti-Tetanus ab  6. Further evaluation and treatment?  7. Return to clinic in 4 weeks or earlier if problem

## 2019-10-23 ENCOUNTER — Encounter: Payer: Self-pay | Admitting: Allergy and Immunology

## 2019-10-25 ENCOUNTER — Ambulatory Visit: Payer: Medicare Other | Attending: Internal Medicine

## 2019-10-25 DIAGNOSIS — Z23 Encounter for immunization: Secondary | ICD-10-CM

## 2019-10-25 NOTE — Progress Notes (Signed)
   Covid-19 Vaccination Clinic  Name:  Chad Morrison    MRN: [id number] DOB: 1932-10-27  10/25/2019  Chad Morrison was observed post Covid-19 immunization for 15 minutes without incident. He was provided with Vaccine Information Sheet and instruction to access the V-Safe system.   Chad Morrison was instructed to call 911 with any severe reactions post vaccine: Marland Kitchen Difficulty breathing  . Swelling of face and throat  . A fast heartbeat  . A bad rash all over body  . Dizziness and weakness

## 2019-10-29 ENCOUNTER — Other Ambulatory Visit: Payer: Self-pay | Admitting: *Deleted

## 2019-10-29 LAB — STREP PNEUMONIAE 23 SEROTYPES IGG
Pneumo Ab Type 1*: 1.3 ug/mL — ABNORMAL LOW (ref >1.3)
Pneumo Ab Type 12 (12F)*: 0.2 ug/mL — ABNORMAL LOW (ref >1.3)
Pneumo Ab Type 14*: 4.8 ug/mL (ref >1.3)
Pneumo Ab Type 17 (17F)*: 0.4 ug/mL — ABNORMAL LOW (ref >1.3)
Pneumo Ab Type 19 (19F)*: 2.2 ug/mL (ref >1.3)
Pneumo Ab Type 2*: 1 ug/mL — ABNORMAL LOW (ref >1.3)
Pneumo Ab Type 20*: 3.1 ug/mL (ref >1.3)
Pneumo Ab Type 22 (22F)*: 1.2 ug/mL — ABNORMAL LOW (ref >1.3)
Pneumo Ab Type 23 (23F)*: 0.4 ug/mL — ABNORMAL LOW (ref >1.3)
Pneumo Ab Type 26 (6B)*: 1.8 ug/mL (ref >1.3)
Pneumo Ab Type 3*: 2.4 ug/mL (ref >1.3)
Pneumo Ab Type 34 (10A)*: 1.2 ug/mL — ABNORMAL LOW (ref >1.3)
Pneumo Ab Type 4*: 0.5 ug/mL — ABNORMAL LOW (ref >1.3)
Pneumo Ab Type 43 (11A)*: 1.5 ug/mL (ref >1.3)
Pneumo Ab Type 5*: 1.6 ug/mL (ref >1.3)
Pneumo Ab Type 51 (7F)*: 0.5 ug/mL — ABNORMAL LOW (ref >1.3)
Pneumo Ab Type 54 (15B)*: 1.5 ug/mL (ref >1.3)
Pneumo Ab Type 56 (18C)*: 3 ug/mL (ref >1.3)
Pneumo Ab Type 57 (19A)*: 3.6 ug/mL (ref >1.3)
Pneumo Ab Type 68 (9V)*: 1.2 ug/mL — ABNORMAL LOW (ref >1.3)
Pneumo Ab Type 70 (33F)*: 1.2 ug/mL — ABNORMAL LOW (ref >1.3)
Pneumo Ab Type 8*: 0.3 ug/mL — ABNORMAL LOW (ref >1.3)
Pneumo Ab Type 9 (9N)*: 0.3 ug/mL — ABNORMAL LOW (ref >1.3)

## 2019-10-29 LAB — ALLERGENS W/TOTAL IGE AREA 2
Alternaria Alternata IgE: <0.1 kU/L
Aspergillus Fumigatus IgE: 1.22 kU/L — AB
Bermuda Grass IgE: <0.1 kU/L
Cat Dander IgE: <0.1 kU/L
Cedar, Mountain IgE: <0.1 kU/L
Cladosporium Herbarum IgE: <0.1 kU/L
Cockroach, German IgE: <0.1 kU/L
Common Silver Birch IgE: <0.1 kU/L
Cottonwood IgE: <0.1 kU/L
D Farinae IgE: <0.1 kU/L
D Pteronyssinus IgE: <0.1 kU/L
Dog Dander IgE: <0.1 kU/L
Elm, American IgE: <0.1 kU/L
IgE (Immunoglobulin E), Serum: 83 IU/mL (ref 6–495)
Johnson Grass IgE: <0.1 kU/L
Maple/Box Elder IgE: <0.1 kU/L
Mouse Urine IgE: <0.1 kU/L
Oak, White IgE: <0.1 kU/L
Pecan, Hickory IgE: <0.1 kU/L
Penicillium Chrysogen IgE: 0.12 kU/L — AB
Pigweed, Rough IgE: <0.1 kU/L
Ragweed, Short IgE: <0.1 kU/L
Sheep Sorrel IgE Qn: <0.1 kU/L
Timothy Grass IgE: <0.1 kU/L
White Mulberry IgE: <0.1 kU/L

## 2019-10-29 LAB — CBC WITH DIFFERENTIAL/PLATELET
Basophils Absolute: 0.1 10*3/uL (ref 0.0–0.2)
Basos: 2 %
EOS (ABSOLUTE): 0.8 10*3/uL — ABNORMAL HIGH (ref 0.0–0.4)
Eos: 14 %
Hematocrit: 38.7 % (ref 37.5–51.0)
Hemoglobin: 13.7 g/dL (ref 13.0–17.7)
Immature Grans (Abs): 0 10*3/uL (ref 0.0–0.1)
Immature Granulocytes: 0 %
Lymphocytes Absolute: 0.9 10*3/uL (ref 0.7–3.1)
Lymphs: 16 %
MCH: 31.6 pg (ref 26.6–33.0)
MCHC: 35.4 g/dL (ref 31.5–35.7)
MCV: 89 fL (ref 79–97)
Monocytes Absolute: 0.8 10*3/uL (ref 0.1–0.9)
Monocytes: 13 %
Neutrophils Absolute: 3.1 10*3/uL (ref 1.4–7.0)
Neutrophils: 55 %
Platelets: 204 10*3/uL (ref 150–450)
RBC: 4.33 x10E6/uL (ref 4.14–5.80)
RDW: 14.8 % (ref 11.6–15.4)
WBC: 5.7 10*3/uL (ref 3.4–10.8)

## 2019-10-29 LAB — IGG, IGA, IGM
IgA/Immunoglobulin A, Serum: 66 mg/dL (ref 61–437)
IgG (Immunoglobin G), Serum: 477 mg/dL — ABNORMAL LOW (ref 603–1613)
IgM (Immunoglobulin M), Srm: 1091 mg/dL — ABNORMAL HIGH (ref 15–143)

## 2019-10-29 LAB — DIPHTHERIA / TETANUS ANTIBODY PANEL
Diphtheria Ab: <0.1 IU/mL — ABNORMAL LOW (ref <0.10)
Tetanus Ab, IgG: 0.11 IU/mL (ref <0.10)

## 2019-10-29 MED ORDER — BUDESONIDE 0.5 MG/2ML IN SUSP: RESPIRATORY_TRACT | 1 refills | Status: DC

## 2019-10-31 ENCOUNTER — Telehealth: Payer: Self-pay

## 2019-10-31 ENCOUNTER — Encounter (INDEPENDENT_AMBULATORY_CARE_PROVIDER_SITE_OTHER): Payer: Medicare Other | Admitting: Ophthalmology

## 2019-10-31 NOTE — Telephone Encounter (Signed)
Patient approved.  Faxed approval letter to [place] [phone]).

## 2019-10-31 NOTE — Telephone Encounter (Signed)
Started prior authorization on [email or web address] for patient's budesonide 0.5mg  ampules.  Waiting for approval or denial.

## 2019-11-05 ENCOUNTER — Other Ambulatory Visit: Payer: Self-pay

## 2019-11-05 ENCOUNTER — Encounter (INDEPENDENT_AMBULATORY_CARE_PROVIDER_SITE_OTHER): Payer: Self-pay | Admitting: Ophthalmology

## 2019-11-05 ENCOUNTER — Ambulatory Visit (INDEPENDENT_AMBULATORY_CARE_PROVIDER_SITE_OTHER): Payer: Medicare Other | Admitting: Ophthalmology

## 2019-11-05 DIAGNOSIS — H35371 Puckering of macula, right eye: Secondary | ICD-10-CM

## 2019-11-05 DIAGNOSIS — H59039 Cystoid macular edema following cataract surgery, unspecified eye: Secondary | ICD-10-CM

## 2019-11-05 DIAGNOSIS — H353211 Exudative age-related macular degeneration, right eye, with active choroidal neovascularization: Secondary | ICD-10-CM

## 2019-11-05 MED ORDER — RANIBIZUMAB 0.5 MG/0.05ML IZ SOLN FOR KALEIDOSCOPE
0.5000 mg | INTRAVITREAL | Status: AC | PRN
  Administered 2019-11-05: .5 mg via INTRAVITREAL

## 2019-11-05 NOTE — Assessment & Plan Note (Signed)
Vitreous strand released by vitreal lysis this is no longer a complicating factor

## 2019-11-05 NOTE — Assessment & Plan Note (Signed)

## 2019-11-05 NOTE — Progress Notes (Signed)
11/05/2019     CHIEF COMPLAINT Patient presents for Retina Follow Up   HISTORY OF PRESENT ILLNESS: Chad Morrison is a 86 y.o. 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  Severity is moderate.  Duration of 6.  Since onset it is stable.  I, the attending physician,  performed the HPI with the patient and updated documentation appropriately.          Comments    6 Week Wet AMD f\u OD. Possible Lucentis OD. OCT  Pt states no changes in vision. Denies any complaints.       Last edited by Tilda Franco on 11/05/2019  1:57 PM. (History)      Referring physician: Haywood Pao, MD [place],  [place] [address]  HISTORICAL INFORMATION:   Selected notes from the MEDICAL RECORD NUMBER       CURRENT MEDICATIONS: Current Outpatient Medications (Ophthalmic Drugs)  Medication Sig  . Bromfenac Sodium (PROLENSA) 0.07 % SOLN Apply 1 drop to eye daily.   No current facility-administered medications for this visit. (Ophthalmic Drugs)   Current Outpatient Medications (Other)  Medication Sig  . albuterol (VENTOLIN HFA) 108 (90 Base) MCG/ACT inhaler USE 1 TO 2 INHALATIONS     ORALLY EVERY 6 HOURS AS    NEEDED FOR WHEEZING OR     SHORTNESS OF BREATH  . ALPRAZolam (XANAX) 0.25 MG tablet Take 0.25 mg by mouth at bedtime as needed for sleep.   Marland Kitchen apixaban (ELIQUIS) 5 MG TABS tablet Take 1 tablet (5 mg total) by mouth 2 (two) times daily.  . Ascorbic Acid (VITAMIN C PO) Take by mouth. Not sure the dosage  . azelastine (ASTELIN) 0.1 % nasal spray Place 1 spray into both nostrils 2 (two) times daily. Use in each nostril as directed (Patient taking differently: Place 1 spray into both nostrils daily. Use in each nostril as directed)  . budesonide (PULMICORT) 0.5 MG/2ML nebulizer solution Use as directed with nasal saline.  . budesonide-formoterol (SYMBICORT) 160-4.5 MCG/ACT inhaler Inhale 2 puffs into the  lungs in the morning and at bedtime.  . budesonide-formoterol (SYMBICORT) 160-4.5 MCG/ACT inhaler Inhale 2 puffs into the lungs 2 (two) times daily.  . Calcium Carbonate-Vitamin D (CALCIUM-VITAMIN D) 600-125 MG-UNIT TABS Take 1 tablet by mouth 2 (two) times daily.   Marland Kitchen diltiazem (CARDIZEM CD) 180 MG 24 hr capsule Take 1 capsule (180 mg total) by mouth in the morning and at bedtime.  . diphenhydrAMINE (BENADRYL) 25 MG tablet Take 50 mg by mouth at bedtime as needed.   . FEROSUL 325 (65 Fe) MG tablet Take 325 mg by mouth daily.  . folic acid (FOLVITE) 1 MG tablet Take 2 mg by mouth daily.   Marland Kitchen levocetirizine (XYZAL) 5 MG tablet Take 1 tablet (5 mg total) by mouth every evening.  Marland Kitchen levothyroxine (SYNTHROID, LEVOTHROID) 75 MCG tablet Take 75 mcg by mouth daily before breakfast.  . methotrexate 2.5 MG tablet 6 tabs weekly  . montelukast (SINGULAIR) 10 MG tablet Take 1 tablet (10 mg total) by mouth at bedtime.  . Multiple Vitamin (MULTIVITAMIN WITH MINERALS) TABS tablet Take 1 tablet daily by mouth.  . Multiple Vitamins-Minerals (OCUVITE PRESERVISION PO) Take 1 tablet 2 (two) times daily by mouth.  . pantoprazole (PROTONIX) 40 MG tablet Take 40 mg by mouth daily.  . rosuvastatin (CRESTOR) 5 MG tablet  Take 1 tablet (5 mg total) by mouth daily.  Marland Kitchen triamcinolone cream (KENALOG) 0.1 % APPLY A SMALL AMOUNT TO SKIN TWICE DAILY   No current facility-administered medications for this visit. (Other)      REVIEW OF SYSTEMS:    ALLERGIES Allergies  Allergen Reactions  . Penicillins Hives    Has patient had a PCN reaction causing immediate rash, facial/tongue/throat swelling, SOB or lightheadedness with hypotension: No Has patient had a PCN reaction causing severe rash involving mucus membranes or skin necrosis: No Has patient had a PCN reaction that required hospitalization: No Has patient had a PCN reaction occurring within the last 10 years: Yes If all of the above answers are "NO", then may proceed  with Cephalosporin use.   . Sulfa Antibiotics Other (See Comments)    Unknown to patient   . Latex Hives and Rash    PAST MEDICAL HISTORY Past Medical History:  Diagnosis Date  . Anxiety   . Asthma   . Asthmatic bronchitis   . Atrial flutter (HCC)    chads2vasc score is 3  . Ehrlichiosis 7096  . GERD (gastroesophageal reflux disease)   . Granulomatosis with polyangiitis ([place])   . HTN (hypertension)   . Hyperlipidemia   . Paroxysmal atrial fibrillation (HCC)   . Prostate cancer ([place])   . Seasonal allergies    Past Surgical History:  Procedure Laterality Date  . BASAL CELL CARCINOMA EXCISION  2013   EYELID  . HERNIA REPAIR Bilateral 2000, 2002  . KNEE ARTHROSCOPY  2000  . prostectomy  1995   RADICAL  . TONSILLECTOMY  1940    FAMILY HISTORY Family History  Problem Relation Age of Onset  . Asthma Mother 26  . Allergies Mother   . Ulcers Mother   . CVA Father 5  . Heart Problems Father   . Other Father        NEUROLOGIC ISSUES   . Other Brother 11       BICYCLE ACCIDENT  . Migraines Son   . Diabetes Mellitus I Son     SOCIAL HISTORY Social History   Tobacco Use  . Smoking status: Former Smoker    Packs/day: 0.50    Years: 4.00    Pack years: 2.00    Types: Cigarettes    Quit date: 02/28/1961    Years since quitting: 58.7  . Smokeless tobacco: Never Used  Vaping Use  . Vaping Use: Never used  Substance Use Topics  . Alcohol use: Yes    Alcohol/week: 2.0 - 3.0 standard drinks    Types: 2 - 3 Standard drinks or equivalent per week  . Drug use: No         OPHTHALMIC EXAM: Base Eye Exam    Visual Acuity (Snellen - Linear)      Right Left   Dist [place] 20/40 -2 20/40 +   Dist ph [place] NI        Tonometry (Tonopen, 1:58 PM)      Right Left   Pressure 13 14       Pupils      Pupils Dark Light Shape React APD   Right PERRL 5 4 Irregular Slow None   Left PERRL 4 3 Round Slow None       Visual Fields (Counting fingers)      Left Right    Full  Full       Neuro/Psych    Oriented x3: Yes   Mood/Affect: Normal  Dilation    Right eye: 1.0% Mydriacyl, 2.5% Phenylephrine @ 2:02 PM        Slit Lamp and Fundus Exam    External Exam      Right Left   External Normal Normal       Slit Lamp Exam      Right Left   Lids/Lashes Normal Normal   Conjunctiva/Sclera White and quiet White and quiet   Cornea Clear Clear   Anterior Chamber Deep and quiet has not, no remaining vitreous strand to the cornea Deep and quiet   Iris Round and reactive Round and reactive   Lens Posterior chamber intraocular lens 3+ Nuclear sclerosis   Anterior Vitreous Normal Normal       Fundus Exam      Right Left   Posterior Vitreous Posterior vitreous detachment    Disc Normal    C/D Ratio 0.65    Macula Cystoid macular edema, Soft drusen in the FAZ, Epiretinal membrane    Vessels Normal    Periphery Normal           IMAGING AND PROCEDURES  Imaging and Procedures for 11/05/19  OCT, Retina - OU - Both Eyes       Right Eye Quality was good. Scan locations included subfoveal. Central Foveal Thickness: 462. Progression has been stable. Findings include subretinal hyper-reflective material, cystoid macular edema, epiretinal membrane.   Left Eye Quality was good. Scan locations included subfoveal. Central Foveal Thickness: 337. Progression has been stable. Findings include subretinal hyper-reflective material.        Intravitreal Injection, Pharmacologic Agent - OD - Right Eye       Time Out 11/05/2019. 2:22 PM. Confirmed correct patient, procedure, site, and patient consented.   Anesthesia Topical anesthesia was used. Anesthetic medications included [place] 3.5%.   Procedure Preparation included Ofloxacin , 10% betadine to eyelids, 5% betadine to ocular surface.   Injection:  0.5 mg ranibizumab (LUCENTIS) 0.5 MG/0.05ML intravitreal injection   NDC: 50242-080-01, Lot: [id number]   Route: Intravitreal, Site: Right Eye, Waste: 0  mg  Post-op Post injection exam found visual acuity of at least counting fingers. The patient tolerated the procedure well. There were no complications. The patient received written and verbal post procedure care education.                 ASSESSMENT/PLAN:  Right epiretinal membrane The nature of macular pucker (epiretinal membrane ERM) was discussed with the patient as well as threshold criteria for vitrectomy surgery. I explained that in rare cases another surgery is needed to actually remove a second wrinkle should it regrow.  Most often, the epiretinal membrane and underlying wrinkled internal limiting membrane are removed with the first surgery, to accomplish the goals.   If the operative eye is [place] (natural lens still present), cataract surgery is often recommended prior to Vitrectomy. This will enable the retina surgeon to have the best view during surgery and the patient to obtain optimal results in the future. Treatment options were discussed.  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 Exudative age-related macular degeneration of right eye with active choroidal neovascularization (HCC)  H35.3211 OCT, Retina - OU - Both Eyes    Intravitreal Injection, Pharmacologic Agent - OD - Right Eye    ranibizumab (LUCENTIS) 0.5 MG/0.05ML intravitreal injection 0.5 mg  2. Right epiretinal membrane  H35.371   3. 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  At this point CME OD does persist and is controlled with intravitreal Lucentis.  Some role of ongoing epiretinal membrane may be present.  The [employer] component with iris chafe has been removed post YAG vitreal lysis.  2.  Repeat intravitreal Lucentis OD today and examination in 6 weeks  3.  Ophthalmic Meds Ordered this visit:  Meds ordered this encounter  Medications  . ranibizumab (LUCENTIS) 0.5 MG/0.05ML intravitreal injection 0.5 mg       No  follow-ups on file.  There are no Patient Instructions on file for this visit.   Explained the diagnoses, plan, and follow up with the patient and they expressed understanding.  Patient expressed understanding of the importance of proper follow up care.   Clent Demark Hommer Cunliffe M.D. Diseases & Surgery of the Retina and Vitreous Retina [facility] Diabetic [place] 11/05/19     Abbreviations: M myopia (nearsighted); A astigmatism; H hyperopia (farsighted); P presbyopia; Mrx spectacle prescription;  CTL contact lenses; OD right eye; OS left eye; OU both eyes  XT exotropia; ET esotropia; PEK punctate epithelial keratitis; PEE punctate epithelial erosions; DES dry eye syndrome; MGD meibomian gland dysfunction; ATs artificial tears; PFAT's preservative free artificial tears; [place] nuclear sclerotic cataract; PSC posterior subcapsular cataract; ERM epi-retinal membrane; PVD posterior vitreous detachment; RD retinal detachment; DM diabetes mellitus; DR diabetic retinopathy; NPDR non-proliferative diabetic retinopathy; PDR proliferative diabetic retinopathy; CSME clinically significant macular edema; DME diabetic macular edema; dbh dot blot hemorrhages; CWS cotton wool spot; POAG primary open angle glaucoma; C/D cup-to-disc ratio; HVF humphrey visual field; GVF goldmann visual field; OCT optical coherence tomography; IOP intraocular pressure; BRVO Branch retinal vein occlusion; CRVO central retinal vein occlusion; CRAO central retinal artery occlusion; BRAO branch retinal artery occlusion; RT retinal tear; SB scleral buckle; PPV pars plana vitrectomy; VH Vitreous hemorrhage; PRP panretinal laser photocoagulation; IVK intravitreal kenalog; VMT vitreomacular traction; MH Macular hole;  NVD neovascularization of the disc; NVE neovascularization elsewhere; AREDS age related eye disease study; ARMD age related macular degeneration; POAG primary open angle glaucoma; EBMD epithelial/anterior basement membrane dystrophy; ACIOL  anterior chamber intraocular lens; IOL intraocular lens; PCIOL posterior chamber intraocular lens; Phaco/IOL phacoemulsification with intraocular lens placement; [place] photorefractive keratectomy; LASIK laser assisted in situ keratomileusis; HTN hypertension; DM diabetes mellitus; COPD chronic obstructive pulmonary disease

## 2019-11-08 DIAGNOSIS — J019 Acute sinusitis, unspecified: Secondary | ICD-10-CM | POA: Diagnosis not present

## 2019-11-08 DIAGNOSIS — Z1152 Encounter for screening for COVID-19: Secondary | ICD-10-CM | POA: Diagnosis not present

## 2019-11-08 DIAGNOSIS — R0981 Nasal congestion: Secondary | ICD-10-CM | POA: Diagnosis not present

## 2019-11-08 DIAGNOSIS — J4540 Moderate persistent asthma, uncomplicated: Secondary | ICD-10-CM | POA: Diagnosis not present

## 2019-11-08 DIAGNOSIS — I4892 Unspecified atrial flutter: Secondary | ICD-10-CM | POA: Diagnosis not present

## 2019-11-08 DIAGNOSIS — J302 Other seasonal allergic rhinitis: Secondary | ICD-10-CM | POA: Diagnosis not present

## 2019-11-11 ENCOUNTER — Telehealth: Payer: Self-pay | Admitting: Primary Care

## 2019-11-11 MED ORDER — AZITHROMYCIN 250 MG PO TABS: ORAL_TABLET | ORAL | 0 refills | Status: DC

## 2019-11-11 MED ORDER — PREDNISONE 10 MG PO TABS: ORAL_TABLET | ORAL | 0 refills | Status: DC

## 2019-11-11 NOTE — Telephone Encounter (Signed)
Spoke with patient regarding prior message. Per Beth Please advise he take mucinex twice daily and flonase nasal spray once daily. Sent in zpack. Patient stated he can not take mucinex it's not good to his stomach. Zpack doesn't work, and doing nasal spray. Patient is on Doxycycline 100mg  twice daily for 7 days and started on Saturday. Patient stated last time he was proscribed Prednisone and that helped him .   Beth can you please advise.  Thank you

## 2019-11-11 NOTE — Telephone Encounter (Signed)
Spoke with patient regarding prior message.Per Tami Ribas please cancel zpack. We can send in prednisone 40mg  x 2 days; 30mg  x 2 days; 20mg  x 2 days; 10mg  x 2 days. Complete doxycycline as prescribed. Patient is aware and voice was understanding. Nothing else further needed.

## 2019-11-11 NOTE — Telephone Encounter (Signed)
Please advise he take mucinex twice daily and flonase nasal spray once daily. Sent in zpack.

## 2019-11-11 NOTE — Telephone Encounter (Signed)
Ok please cancel zpack. We can send in prednisone 40mg  x 2 days; 30mg  x 2 days; 20mg  x 2 days; 10mg  x 2 days. Complete doxycycline as prescribed.

## 2019-11-11 NOTE — Telephone Encounter (Signed)
Primary Pulmonologist: Used to see Dr. Lake Bells not established with anyone yet Last office visit and with whom: 09/04/19 Beth What do we see them for (pulmonary problems): Chronic Rhinitis Last OV assessment/plan: See Below  Was appointment offered to patient (explain)?  No   Reason for call: Called and spoke with patient he states that last week his sinuses were bothering him. He went in to see PCP and got RX for Doxy, went out last night to cookout and this morning is very congested and coughing with clear sputum mostly. Denies fever. He was Covid tested Friday and it was negative.  Beth please advise  (examples of things to ask: : When did symptoms start? Fever? Cough? Productive? Color to sputum? More sputum than usual? Wheezing? Have you needed increased oxygen? Are you taking your respiratory medications? What over the counter measures have you tried?)  Allergies  Allergen Reactions  . Penicillins Hives    Has patient had a PCN reaction causing immediate rash, facial/tongue/throat swelling, SOB or lightheadedness with hypotension: No Has patient had a PCN reaction causing severe rash involving mucus membranes or skin necrosis: No Has patient had a PCN reaction that required hospitalization: No Has patient had a PCN reaction occurring within the last 10 years: Yes If all of the above answers are "NO", then may proceed with Cephalosporin use.   . Sulfa Antibiotics Other (See Comments)    Unknown to patient   . Latex Hives and Rash    Immunization History  Administered Date(s) Administered  . Influenza, High Dose Seasonal PF 12/27/2016, 12/18/2017, 11/29/2018  . Influenza-Unspecified 12/27/2016, 12/18/2017, 11/29/2018  . PFIZER SARS-COV-2 Vaccination 03/16/2019, 04/04/2019, 10/25/2019  . Pneumococcal Conjugate-13 01/04/2016  . Pneumococcal Polysaccharide-23 01/03/2014  . Pneumococcal-Unspecified 01/04/2016, 10/31/2016    Assessment & Plan:   Asthma - Patient has increased  cough with clear mucus production x 1 week associated shortness of breath with coughing fits only. No overt wheezing on exam. Unable to take prednisone or mucinex. Eosinophils in 2019 were 100 (previously 500). Patient is on methotrexate, recommend referral to allergy to see if patient would qualify for immunotherapy  Plan: - Continue Symbicort 160 two puffs twice daily - Check CXR today re: bronchitis  - Referral to Asthma/allergy re: asthma, chronic rhinitis - Follow-up in 6 months with NEW pulmonary MD (former [facility]) Or sooner if needed  Chronic rhinitis - Patient has chronic allergic rhinitis. He reports increased nasal congestion, cough and post nasal drip symptoms over the last week  - Continue Xyzal and Singulair as prescribed  - Continue Saline nasal spray twice daily - Adding Astelin nasal spray (patient is intolerant to Intranasal corticosteroids)    Chad Ehrich, NP 09/04/2019

## 2019-11-19 ENCOUNTER — Telehealth: Payer: Self-pay

## 2019-11-19 NOTE — Telephone Encounter (Signed)
Pt would like a personal call from Dr Neldon Mc about his lab results

## 2019-11-21 DIAGNOSIS — M3130 Wegener's granulomatosis without renal involvement: Secondary | ICD-10-CM | POA: Diagnosis not present

## 2019-11-21 DIAGNOSIS — J3489 Other specified disorders of nose and nasal sinuses: Secondary | ICD-10-CM | POA: Diagnosis not present

## 2019-11-21 DIAGNOSIS — K219 Gastro-esophageal reflux disease without esophagitis: Secondary | ICD-10-CM | POA: Diagnosis not present

## 2019-11-26 ENCOUNTER — Telehealth: Payer: Self-pay

## 2019-11-26 ENCOUNTER — Encounter: Payer: Self-pay | Admitting: Allergy and Immunology

## 2019-11-26 ENCOUNTER — Other Ambulatory Visit: Payer: Self-pay

## 2019-11-26 ENCOUNTER — Ambulatory Visit (INDEPENDENT_AMBULATORY_CARE_PROVIDER_SITE_OTHER): Payer: Medicare Other | Admitting: Allergy and Immunology

## 2019-11-26 ENCOUNTER — Other Ambulatory Visit: Payer: Self-pay | Admitting: Allergy and Immunology

## 2019-11-26 VITALS — BP 128/82 | HR 65 | Temp 97.1°F | Resp 18 | Ht 69.0 in

## 2019-11-26 DIAGNOSIS — J3089 Other allergic rhinitis: Secondary | ICD-10-CM | POA: Diagnosis not present

## 2019-11-26 DIAGNOSIS — D849 Immunodeficiency, unspecified: Secondary | ICD-10-CM | POA: Diagnosis not present

## 2019-11-26 DIAGNOSIS — K219 Gastro-esophageal reflux disease without esophagitis: Secondary | ICD-10-CM | POA: Diagnosis not present

## 2019-11-26 DIAGNOSIS — M3130 Wegener's granulomatosis without renal involvement: Secondary | ICD-10-CM

## 2019-11-26 DIAGNOSIS — J4550 Severe persistent asthma, uncomplicated: Secondary | ICD-10-CM | POA: Diagnosis not present

## 2019-11-26 DIAGNOSIS — J324 Chronic pansinusitis: Secondary | ICD-10-CM

## 2019-11-26 DIAGNOSIS — D7219 Other eosinophilia: Secondary | ICD-10-CM

## 2019-11-26 DIAGNOSIS — D801 Nonfamilial hypogammaglobulinemia: Secondary | ICD-10-CM | POA: Diagnosis not present

## 2019-11-26 MED ORDER — ESOMEPRAZOLE MAGNESIUM 40 MG PO CPDR: 40.0000 mg | DELAYED_RELEASE_CAPSULE | Freq: Two times a day (BID) | ORAL | 3 refills | Status: DC

## 2019-11-26 NOTE — Telephone Encounter (Signed)
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 Please contact patient for when they can begin their injections.

## 2019-11-26 NOTE — Patient Instructions (Addendum)
  1.  Treat and prevent inflammation:   A. Symbicort 160 - 2 inhalations 2 times per day    B. START Budesonide 0.5 ampules mixed in nasal saline two times per day  C. Montelukast 10 mg - 1 tablet 1 time per day  D. START Benralizumab injections after insurance approval ([place])  2. Treat and prevent reflux:   A. Minimize caffeine consumption   B. INCREASE Nexium 40 mg to 2 times per day  3. If needed:   A. albuterol HFA - 2 inhalations every 4-6 hours  B. OTC Antihistamine  4. Obtain Pneumovax and DT vaccine. Check Pneumo 23 serotiters and DT titers and IgA/G/M 6 weeks after immunizations.  5. Obtain [place] flu vaccine  6. Immunoglobulin replacement?   7. Return to clinic in 8 weeks or earlier if problem

## 2019-11-26 NOTE — Progress Notes (Signed)
[facility] [place]   Follow-up Note  Referring Provider: Haywood Pao, MD Primary Provider: Haywood Pao, MD Date of Office Visit: 11/26/2019  Subjective:   Chad Morrison (DOB: 17-Jan-1933) is a 86 y.o. male who returns to the [facility] on 11/26/2019 in re-evaluation of the following:  HPI: Chad Morrison returns to this clinic in reevaluation of respiratory tract inflammatory condition including eosinophilic driven asthma, and a suspected component chronic sinusitis and suspected laryngopharyngeal reflux in the context of Wegener's granulomatosis treated with methotrexate and a history of hypogammaglobulinemia.  I last saw him in this clinic during his initial evaluation of 22 October 2019.  During his initial evaluation he was finishing up a course of steroids and his respiratory tract was doing relatively well.  He has noticed that since he has been off of systemic steroids for the past 3 weeks or so he has redeveloped some issues with some coughing and chest congestion and a little bit of head congestion.  For some reason, which has not clearly explained, he did not start his budesonide ampule nasal washes and he did not increase his Nexium to twice a day but did continue on Symbicort and montelukast and remains on methotrexate for his Wegener's granulomatosis.  He has received his [employer] booster.  Allergies as of 11/26/2019      Reactions   Penicillins Hives   Has patient had a PCN reaction causing immediate rash, facial/tongue/throat swelling, SOB or lightheadedness with hypotension: No Has patient had a PCN reaction causing severe rash involving mucus membranes or skin necrosis: No Has patient had a PCN reaction that required hospitalization: No Has patient had a PCN reaction occurring within the last 10 years: Yes If all of the above answers are "NO", then may proceed with Cephalosporin use.   Sulfa Antibiotics Other (See  Comments)   Unknown to patient   Latex Hives, Rash      Medication List    albuterol 108 (90 Base) MCG/ACT inhaler Commonly known as: VENTOLIN HFA USE 1 TO 2 INHALATIONS     ORALLY EVERY 6 HOURS AS    NEEDED FOR WHEEZING OR     SHORTNESS OF BREATH   ALPRAZolam 0.25 MG tablet Commonly known as: XANAX Take 0.25 mg by mouth at bedtime as needed for sleep.   apixaban 5 MG Tabs tablet Commonly known as: Eliquis Take 1 tablet (5 mg total) by mouth 2 (two) times daily.   azelastine 0.1 % nasal spray Commonly known as: ASTELIN Place 1 spray into both nostrils 2 (two) times daily. Use in each nostril as directed What changed: when to take this   budesonide 0.5 MG/2ML nebulizer solution Commonly known as: PULMICORT Use as directed with nasal saline.   budesonide-formoterol 160-4.5 MCG/ACT inhaler Commonly known as: Symbicort Inhale 2 puffs into the lungs in the morning and at bedtime.   budesonide-formoterol 160-4.5 MCG/ACT inhaler Commonly known as: Symbicort Inhale 2 puffs into the lungs 2 (two) times daily.   Calcium-Vitamin D 600-125 MG-UNIT Tabs Take 1 tablet by mouth 2 (two) times daily.   diltiazem 180 MG 24 hr capsule Commonly known as: Cardizem CD Take 1 capsule (180 mg total) by mouth in the morning and at bedtime.   diphenhydrAMINE 25 MG tablet Commonly known as: BENADRYL Take 50 mg by mouth at bedtime as needed.   esomeprazole 40 MG capsule Commonly known as: NexIUM Take 1 capsule (40 mg total) by mouth  2 (two) times daily before a meal. Started by: Chad Walbert Chad Rosebush, MD   FeroSul 325 (65 FE) MG tablet Generic drug: ferrous sulfate Take 325 mg by mouth daily.   folic acid 1 MG tablet Commonly known as: FOLVITE Take 2 mg by mouth daily.   levocetirizine 5 MG tablet Commonly known as: XYZAL Take 1 tablet (5 mg total) by mouth every evening.   levothyroxine 75 MCG tablet Commonly known as: SYNTHROID Take 75 mcg by mouth daily before breakfast.     methotrexate 2.5 MG tablet 6 tabs weekly   montelukast 10 MG tablet Commonly known as: SINGULAIR Take 1 tablet (10 mg total) by mouth at bedtime.   multivitamin with minerals Tabs tablet Take 1 tablet daily by mouth.   OCUVITE PRESERVISION PO Take 1 tablet 2 (two) times daily by mouth.   pantoprazole 40 MG tablet Commonly known as: PROTONIX Take 40 mg by mouth daily.   Prolensa 0.07 % Soln Generic drug: Bromfenac Sodium Apply 1 drop to eye daily.   rosuvastatin 5 MG tablet Commonly known as: Crestor Take 1 tablet (5 mg total) by mouth daily.   triamcinolone cream 0.1 % Commonly known as: KENALOG APPLY A SMALL AMOUNT TO SKIN TWICE DAILY   VITAMIN C PO Take by mouth. Not sure the dosage       Past Medical History:  Diagnosis Date   Anxiety    Asthma    Asthmatic bronchitis    Atrial flutter ([place])    chads2vasc score is 3   Ehrlichiosis 9892   GERD (gastroesophageal reflux disease)    Granulomatosis with polyangiitis (HCC)    HTN (hypertension)    Hyperlipidemia    Paroxysmal atrial fibrillation (HCC)    Prostate cancer (HCC)    Seasonal allergies     Past Surgical History:  Procedure Laterality Date   BASAL CELL CARCINOMA EXCISION  2013   EYELID   HERNIA REPAIR Bilateral 2000, 2002   KNEE ARTHROSCOPY  2000   prostectomy  [place]    Review of systems negative except as noted in HPI / PMHx or noted below:  Review of Systems  Constitutional: Negative.   HENT: Negative.   Eyes: Negative.   Respiratory: Negative.   Cardiovascular: Negative.   Gastrointestinal: Negative.   Genitourinary: Negative.   Musculoskeletal: Negative.   Skin: Negative.   Neurological: Negative.   Endo/Heme/Allergies: Negative.   Psychiatric/Behavioral: Negative.      Objective:   Vitals:   11/26/19 1055  BP: 128/82  Pulse: 65  Resp: 18  Temp: (!) 97.1 F (36.2 C)  SpO2: 98%   Height: 5\' 9"  (175.3 cm)       Physical Exam Constitutional:      Appearance: He is not diaphoretic.  HENT:     Head: Normocephalic.     Right Ear: Tympanic membrane, ear canal and external ear normal.     Left Ear: Tympanic membrane, ear canal and external ear normal.     Nose: Nose normal. No mucosal edema (Septal perforation) or rhinorrhea.     Mouth/Throat:     Pharynx: Uvula midline. No oropharyngeal exudate.  Eyes:     Conjunctiva/sclera: Conjunctivae normal.  Neck:     Thyroid: No thyromegaly.     Trachea: Trachea normal. No tracheal tenderness or tracheal deviation.  Cardiovascular:     Rate and Rhythm: Normal rate. Rhythm irregular.     Heart sounds: S1 normal and S2 normal.  Murmur (Systolic) heard.   Pulmonary:     Effort: No respiratory distress.     Breath sounds: No stridor. Wheezing (Bilateral expiratory wheezes all lung fields) present. No rales.  Lymphadenopathy:     Head:     Right side of head: No tonsillar adenopathy.     Left side of head: No tonsillar adenopathy.     Cervical: No cervical adenopathy.  Skin:    Findings: No erythema or rash.     Nails: There is no clubbing.  Neurological:     Mental Status: He is alert.     Diagnostics:    Spirometry was performed and demonstrated an FEV1 of 2.43 at 96 % of predicted.  The patient had an Asthma Control Test with the following results: ACT Total Score: 25.    Results of blood tests obtained 22 October 2019 identified IgG 477 mg/DL, IgA 66 mg/DL, IgM 1091 mg/DL, IgE 83 KU/L, antigen specific IgE antibodies directed against Aspergillus and penicillium on an area to aero allergen profile, WBC 5.7, absolute eosinophil 800, absolute lymphocyte 900, hemoglobin 13.7, platelet 204, approximately 50% of pneumococcal serotypes on a pneumo 23 assay with an inadequate antibody levels, the absence of any antidiphtheria antibodies, tetanus IgG 0.11 IU/mL  Review of a ENT note by Dr. Constance Holster dated 21 November 2019 refers to a normal upper airway  exam other than large septal perforation.  There was no mention of throat findings.  Assessment and Plan:   1. Not well controlled severe persistent asthma   2. Other eosinophilia   3. 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 Chronic pansinusitis   5. Perennial allergic rhinitis   6. Hypogammaglobulinemia ([place])   7. Immunosuppression ([place])   8. LPRD (laryngopharyngeal reflux disease)     1.  Treat and prevent inflammation:   A. Symbicort 160 - 2 inhalations 2 times per day    B. START Budesonide 0.5 ampules mixed in nasal saline two times per day  C. Montelukast 10 mg - 1 tablet 1 time per day  D. START Benralizumab injections after insurance approval ([place])  2. Treat and prevent reflux:   A. Minimize caffeine consumption   B. INCREASE Nexium 40 mg to 2 times per day  3. If needed:   A. albuterol HFA - 2 inhalations every 4-6 hours  B. OTC Antihistamine  4. Obtain Pneumovax and DT vaccine. Check Pneumo 23 serotiters and DT titers and IgA/G/M 6 weeks after immunizations.  5. Obtain Fall flu vaccine  6. Immunoglobulin replacement?   7. Return to clinic in 8 weeks or earlier if problem  Romie Minus continues with significant inflammation of his respiratory tract and given his rather impressive eosinophilia I think he needs to start benralizumab injections as a trial for the next 3 months to see if this helps his respiratory tract issue.  As well I would like for him to start his budesonide washes and because he appears to have a component of LPR we will increase his Nexium to twice a day.  He has hypogammaglobulinemia but he actually has antigen specific antibodies directed at approximately 50% of his serotypes of pneumococcus on his Pneumo23 assay and also appears to have allergen specific antibodies on his allergen assay.  I think the best thing to do in this case is to immunize him with Pneumovax and DT and see if he has a significant response to this  antigen exposure.  If so, then I think that we can hold  off on immunoglobulin replacement.  If he does not have an adequate response then he will probably require immunoglobulin replacement.  I will see him back in his clinic in 8 weeks or earlier if there is a problem.  Allena Katz, MD Allergy / Immunology [place]

## 2019-11-27 ENCOUNTER — Encounter: Payer: Self-pay | Admitting: Allergy and Immunology

## 2019-11-27 NOTE — Telephone Encounter (Signed)
Dr. Neldon Mc per Pharmacy comment: Concurrent use of Methotrexate and (Esomeprazole) may increase MTX side effects (e.g. increased risk of GI bleeding, masking signs of an infection and pulmonary symptoms). Should pt remain on both? Please respond with appropriate changes or comment to Pharmac.  Concurrent use of Methotrexate and (Esomeprazole) may increase MTX side effects (e.g. increased risk of GI bleeding, masking signs of an infection and pulmonary symptoms). Should pt remain on both? Please resp.  Please advise

## 2019-11-27 NOTE — Telephone Encounter (Signed)
Called patient and advised we will buy and bill his Berna Bue so that his medication is paid thru [facility] and supplement.  He is going to call me back to schedule start appt

## 2019-11-28 DIAGNOSIS — M3130 Wegener's granulomatosis without renal involvement: Secondary | ICD-10-CM | POA: Diagnosis not present

## 2019-11-28 DIAGNOSIS — M8949 Other hypertrophic osteoarthropathy, multiple sites: Secondary | ICD-10-CM | POA: Diagnosis not present

## 2019-11-28 DIAGNOSIS — Z79899 Other long term (current) drug therapy: Secondary | ICD-10-CM | POA: Diagnosis not present

## 2019-12-03 ENCOUNTER — Other Ambulatory Visit: Payer: Self-pay | Admitting: Adult Health

## 2019-12-03 DIAGNOSIS — J4550 Severe persistent asthma, uncomplicated: Secondary | ICD-10-CM | POA: Diagnosis not present

## 2019-12-04 ENCOUNTER — Ambulatory Visit (INDEPENDENT_AMBULATORY_CARE_PROVIDER_SITE_OTHER): Payer: Medicare Other | Admitting: *Deleted

## 2019-12-04 ENCOUNTER — Other Ambulatory Visit: Payer: Self-pay

## 2019-12-04 ENCOUNTER — Ambulatory Visit: Payer: Medicare Other

## 2019-12-04 DIAGNOSIS — J4550 Severe persistent asthma, uncomplicated: Secondary | ICD-10-CM

## 2019-12-04 MED ORDER — BENRALIZUMAB 30 MG/ML ~~LOC~~ SOSY
30.0000 mg | PREFILLED_SYRINGE | Freq: Once | SUBCUTANEOUS | Status: AC
  Administered 2019-12-04: 30 mg via SUBCUTANEOUS

## 2019-12-04 MED ORDER — EPINEPHRINE 0.3 MG/0.3ML IJ SOAJ: 0.3000 mg | Freq: Once | INTRAMUSCULAR | 1 refills | Status: AC

## 2019-12-04 NOTE — Progress Notes (Signed)
Immunotherapy   Patient Details  Name: Chad Morrison MRN: [id number] Date of Birth: Jun 09, 1932  12/04/2019  Fuller Song started injections for  Fasenra  Frequency: Every 4 weeks #3, then Every 8 Weeks  Epi-Pen: Yes Consent signed and patient instructions given. Patient started Berna Bue today and received a 30mg  injection in the [facility]. Patient waited 30 minutes in office and did not experience any issues.    [name] 12/04/2019, 11:48 AM

## 2019-12-05 ENCOUNTER — Encounter: Payer: Self-pay | Admitting: Pulmonary Disease

## 2019-12-05 ENCOUNTER — Ambulatory Visit (INDEPENDENT_AMBULATORY_CARE_PROVIDER_SITE_OTHER): Payer: Medicare Other | Admitting: Pulmonary Disease

## 2019-12-05 ENCOUNTER — Telehealth: Payer: Self-pay | Admitting: Allergy and Immunology

## 2019-12-05 VITALS — BP 122/60 | HR 84 | Temp 97.6°F | Ht 69.0 in | Wt 149.4 lb

## 2019-12-05 DIAGNOSIS — J4550 Severe persistent asthma, uncomplicated: Secondary | ICD-10-CM

## 2019-12-05 DIAGNOSIS — J4540 Moderate persistent asthma, uncomplicated: Secondary | ICD-10-CM | POA: Diagnosis not present

## 2019-12-05 MED ORDER — PREDNISONE 10 MG PO TABS: ORAL_TABLET | ORAL | 0 refills | Status: AC

## 2019-12-05 NOTE — Telephone Encounter (Signed)
Called the patient and advised. Patient verbalized understanding.

## 2019-12-05 NOTE — Patient Instructions (Signed)
Continue the Symbicort inhaler We will give you a prescription for prednisone to be held in reserve in case your breathing gets worse Make sure that he use the Astelin and the budesonide nasal rinse to help with the sinus congestion and postnasal drip Continue Nucala injections  Follow-up 3 months.

## 2019-12-05 NOTE — Progress Notes (Addendum)
Chad Morrison    [id number]    [birth year]  Primary Care Physician:Tisovec, Fransico Him, MD  Referring Physician: Haywood Pao, MD [address] [place],  [place] [address]  Chief complaint: Follow-up for asthma  HPI: 86 year old with severe asthma, acid reflux, LPR, hypogammaglobilinemia, granulomatosis with polyangiitis Previously followed by Dr. Shelbie Hutching on Symbicort inhaler. He was recently evaluated by Dr. Neldon Mc from allergy and started on Nucala.  He received his first dose yesterday and reports increased sinus congestion, postnasal drip with cough.  States that breathing is doing okay.  He is somewhat disappointed that he has not felt immediately better.  History notable for granulomatosis with polyangiitis.  He follows at [facility] for this.  Previously on methotrexate and prednisone.  His prednisone has been weaned off and methotrexate is coming down as well.  He feels that his asthma symptoms were under good control while he was on the prednisone but has flared up once he was offered.  Prednisone tapers usually helps him with his symptoms.  Continues on Nexium for GERD.  He was started on budesonide until nasal sprays and Nexium increased to twice a day by Dr. Neldon Mc but has been noncompliant with this change  Pets: No pets Occupation: Retired Financial planner Exposures: No known exposures.  No mold, hot tub, Jacuzzi.  No feather pillows or comforter Smoking history: 4-pack-year smoker.  Quit in 1963 Travel history: Originally from [place].  No significant recent travel Relevant family history: No significant family history of lung disease   Outpatient Encounter Medications as of 12/05/2019  Medication Sig  . albuterol (VENTOLIN HFA) 108 (90 Base) MCG/ACT inhaler USE 1 TO 2 INHALATIONS     ORALLY EVERY 6 HOURS AS    NEEDED FOR WHEEZING OR     SHORTNESS OF BREATH  . ALPRAZolam (XANAX) 0.25 MG tablet Take 0.25 mg by mouth at bedtime as needed for sleep.    Marland Kitchen apixaban (ELIQUIS) 5 MG TABS tablet Take 1 tablet (5 mg total) by mouth 2 (two) times daily.  . Ascorbic Acid (VITAMIN C PO) Take by mouth. Not sure the dosage  . azelastine (ASTELIN) 0.1 % nasal spray Place 1 spray into both nostrils 2 (two) times daily. Use in each nostril as directed (Patient taking differently: Place 1 spray into both nostrils daily. Use in each nostril as directed)  . Bromfenac Sodium (PROLENSA) 0.07 % SOLN Apply 1 drop to eye daily.  . budesonide (PULMICORT) 0.5 MG/2ML nebulizer solution Use as directed with nasal saline.  . budesonide-formoterol (SYMBICORT) 160-4.5 MCG/ACT inhaler Inhale 2 puffs into the lungs in the morning and at bedtime.  . budesonide-formoterol (SYMBICORT) 160-4.5 MCG/ACT inhaler Inhale 2 puffs into the lungs 2 (two) times daily.  . Calcium Carbonate-Vitamin D (CALCIUM-VITAMIN D) 600-125 MG-UNIT TABS Take 1 tablet by mouth 2 (two) times daily.   Marland Kitchen diltiazem (CARDIZEM CD) 180 MG 24 hr capsule Take 1 capsule (180 mg total) by mouth in the morning and at bedtime.  . diphenhydrAMINE (BENADRYL) 25 MG tablet Take 50 mg by mouth at bedtime as needed.   Marland Kitchen esomeprazole (NEXIUM) 40 MG capsule Take 1 capsule (40 mg total) by mouth 2 (two) times daily before a meal.  . FEROSUL 325 (65 Fe) MG tablet Take 325 mg by mouth daily.  . folic acid (FOLVITE) 1 MG tablet Take 2 mg by mouth daily.   Marland Kitchen levocetirizine (XYZAL) 5 MG tablet Take 1 tablet (5 mg total) by  mouth every evening.  Marland Kitchen levothyroxine (SYNTHROID, LEVOTHROID) 75 MCG tablet Take 75 mcg by mouth daily before breakfast.  . methotrexate 2.5 MG tablet 6 tabs weekly  . montelukast (SINGULAIR) 10 MG tablet TAKE 1 TABLET AT BEDTIME  . Multiple Vitamin (MULTIVITAMIN WITH MINERALS) TABS tablet Take 1 tablet daily by mouth.  . Multiple Vitamins-Minerals (OCUVITE PRESERVISION PO) Take 1 tablet 2 (two) times daily by mouth.  . pantoprazole (PROTONIX) 40 MG tablet Take 40 mg by mouth daily.  . rosuvastatin (CRESTOR) 5  MG tablet Take 1 tablet (5 mg total) by mouth daily.  Marland Kitchen triamcinolone cream (KENALOG) 0.1 % APPLY A SMALL AMOUNT TO SKIN TWICE DAILY   No facility-administered encounter medications on file as of 12/05/2019.   Physical Exam: Blood pressure 122/60, pulse 84, temperature 97.6 F (36.4 C), temperature source Temporal, height 5\' 9"  (1.753 m), weight 149 lb 6.4 oz (67.8 kg), SpO2 98 %. Gen:      No acute distress HEENT:  EOMI, sclera anicteric Neck:     No masses; no thyromegaly Lungs:    Clear to auscultation bilaterally; normal respiratory effort CV:         Regular rate and rhythm; no murmurs Abd:      + bowel sounds; soft, non-tender; no palpable masses, no distension Ext:    No edema; adequate peripheral perfusion Skin:      Warm and dry; no rash Neuro: alert and oriented x 3 Psych: normal mood and affect  Data Reviewed: Imaging: High-resolution CT 12/22/2017-mild bibasal groundglass with mild reticulation.  Pulmonary nodules. CT chest 05/06/2019-stable pulmonary nodules since 2019.  Scattered linear atelectasis and mild bronchiectasis I have reviewed the images personally.  PFTs: 05/24/2017 FVC 3.78 [96%], FEV1 2.44 [88%], F/F 65, TLC 7.41 [104%], DLCO 22.81 [90%] Moderate obstruction with air trapping and mild diffusion impairment  ACT score 12/07/2019- 19  Labs: CBC 10/22/2019-WBC 5.7, eos 14%, absolute eosinophil count 798  Assessment:  Severe persistent asthma with eosinophilia Sinusitis Continue Symbicort inhaler.  Recently started on Nucala  He is disappointed that Nucala has not immediately made him better and is wondering if his increased postnasal discharge is due to [place].  We discussed the timeline of Nucala action and set expectations that it may take weeks to months before he feels the full effect.  The sinus congestion and postnasal discharge is a longstanding problem for him and I do not think this is directly related to the Nucala injection  Encouraged him to use  the Astelin and add budesonide nasal washes more regularly as he is noncompliant with them Advised that he can take Benadryl twice a day as needed  Granulomatosis with polyangiitis On methotrexate per rheumatology His CT scan continues to show mild basal reticulation and groundglass which we will monitor  Plan/Recommendations: Continue Symbicort, Nucala Astelin, budesonide nasal washes Benadryl over-the-counter  Marshell Garfinkel MD [facility] 12/05/2019, 11:25 AM  CC: Tisovec, Fransico Him, MD

## 2019-12-05 NOTE — Telephone Encounter (Signed)
Please inform Gene that this is an unusual reaction to [place].  We will just need to see if it happens again with his next injection.

## 2019-12-05 NOTE — Telephone Encounter (Signed)
Patient received first Fasenra injection yesterday and states he feels very lousy today. Patient was congested yesterday and thought he would feel better, but he feels about the same. Patient would like to know how long he will feel like this and how long it will take to be effective.  Please advise.

## 2019-12-05 NOTE — Telephone Encounter (Signed)
Called and spoke with the patient and he stated that after he got his Fasenra injection but that later that evening he had a headache and some congestion. Patient stated that he took a benadryl and this morning he still felt congested and had a headache and took another benadryl. He states that he feels better now but he wanted to know if this was normal or to be expected after taking Fasenra. Please advise.

## 2019-12-11 ENCOUNTER — Telehealth: Payer: Self-pay | Admitting: Allergy and Immunology

## 2019-12-11 NOTE — Telephone Encounter (Signed)
Patient wife called and needs a nurse to call back about him getting a dt vaccine and  Pneumovax. [phone].

## 2019-12-12 DIAGNOSIS — C44719 Basal cell carcinoma of skin of left lower limb, including hip: Secondary | ICD-10-CM | POA: Diagnosis not present

## 2019-12-16 ENCOUNTER — Other Ambulatory Visit: Payer: Self-pay | Admitting: Primary Care

## 2019-12-17 ENCOUNTER — Other Ambulatory Visit: Payer: Self-pay

## 2019-12-17 ENCOUNTER — Encounter (INDEPENDENT_AMBULATORY_CARE_PROVIDER_SITE_OTHER): Payer: Self-pay | Admitting: Ophthalmology

## 2019-12-17 ENCOUNTER — Ambulatory Visit (INDEPENDENT_AMBULATORY_CARE_PROVIDER_SITE_OTHER): Payer: Medicare Other | Admitting: Ophthalmology

## 2019-12-17 ENCOUNTER — Encounter (INDEPENDENT_AMBULATORY_CARE_PROVIDER_SITE_OTHER): Payer: Medicare Other | Admitting: Ophthalmology

## 2019-12-17 DIAGNOSIS — H353211 Exudative age-related macular degeneration, right eye, with active choroidal neovascularization: Secondary | ICD-10-CM

## 2019-12-17 DIAGNOSIS — H35351 Cystoid macular degeneration, right eye: Secondary | ICD-10-CM

## 2019-12-17 MED ORDER — RANIBIZUMAB 0.5 MG/0.05ML IZ SOLN FOR KALEIDOSCOPE
0.5000 mg | INTRAVITREAL | Status: AC | PRN
  Administered 2019-12-17: .5 mg via INTRAVITREAL

## 2019-12-17 NOTE — Assessment & Plan Note (Signed)
Status post YAG vitreal lysis, improved CME still on similar interval examination and delivery of Lucentis

## 2019-12-17 NOTE — Progress Notes (Signed)
12/17/2019     CHIEF COMPLAINT Patient presents for Retina Follow Up   HISTORY OF PRESENT ILLNESS: Chad Morrison is a 86 y.o. 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  Severity is moderate.  Duration of 6 weeks.  Since onset it is stable.  I, the attending physician,  performed the HPI with the patient and updated documentation appropriately.          Comments    6 Week Wet AMD f\u OD. Possible Lucentis OD. OCT  Pt states no changes in vision. Denies any complaints.       Last edited by Tilda Franco on 12/17/2019 10:27 AM. (History)      Referring physician: Haywood Pao, MD [place],  [place] [id number]  HISTORICAL INFORMATION:   Selected notes from the MEDICAL RECORD NUMBER       CURRENT MEDICATIONS: Current Outpatient Medications (Ophthalmic Drugs)  Medication Sig  . Bromfenac Sodium (PROLENSA) 0.07 % SOLN Apply 1 drop to eye daily.   No current facility-administered medications for this visit. (Ophthalmic Drugs)   Current Outpatient Medications (Other)  Medication Sig  . albuterol (VENTOLIN HFA) 108 (90 Base) MCG/ACT inhaler USE 1 TO 2 INHALATIONS     ORALLY EVERY 6 HOURS AS    NEEDED FOR WHEEZING OR     SHORTNESS OF BREATH  . ALPRAZolam (XANAX) 0.25 MG tablet Take 0.25 mg by mouth at bedtime as needed for sleep.   Marland Kitchen apixaban (ELIQUIS) 5 MG TABS tablet Take 1 tablet (5 mg total) by mouth 2 (two) times daily.  . Ascorbic Acid (VITAMIN C PO) Take by mouth. Not sure the dosage  . azelastine (ASTELIN) 0.1 % nasal spray INSTILL 1 SPRAY INTO EACH NOSTRIL TWICE DAILY AS DIRECTED  . budesonide (PULMICORT) 0.5 MG/2ML nebulizer solution Use as directed with nasal saline.  . budesonide-formoterol (SYMBICORT) 160-4.5 MCG/ACT inhaler Inhale 2 puffs into the lungs in the morning and at bedtime.  . budesonide-formoterol (SYMBICORT) 160-4.5 MCG/ACT inhaler Inhale 2 puffs into  the lungs 2 (two) times daily.  . Calcium Carbonate-Vitamin D (CALCIUM-VITAMIN D) 600-125 MG-UNIT TABS Take 1 tablet by mouth 2 (two) times daily.   Marland Kitchen diltiazem (CARDIZEM CD) 180 MG 24 hr capsule Take 1 capsule (180 mg total) by mouth in the morning and at bedtime.  . diphenhydrAMINE (BENADRYL) 25 MG tablet Take 50 mg by mouth at bedtime as needed.   Marland Kitchen esomeprazole (NEXIUM) 40 MG capsule Take 1 capsule (40 mg total) by mouth 2 (two) times daily before a meal.  . FEROSUL 325 (65 Fe) MG tablet Take 325 mg by mouth daily.  . folic acid (FOLVITE) 1 MG tablet Take 2 mg by mouth daily.   Marland Kitchen levocetirizine (XYZAL) 5 MG tablet Take 1 tablet (5 mg total) by mouth every evening.  Marland Kitchen levothyroxine (SYNTHROID, LEVOTHROID) 75 MCG tablet Take 75 mcg by mouth daily before breakfast.  . methotrexate 2.5 MG tablet 6 tabs weekly  . montelukast (SINGULAIR) 10 MG tablet TAKE 1 TABLET AT BEDTIME  . Multiple Vitamin (MULTIVITAMIN WITH MINERALS) TABS tablet Take 1 tablet daily by mouth.  . Multiple Vitamins-Minerals (OCUVITE PRESERVISION PO) Take 1 tablet 2 (two) times daily by mouth.  . pantoprazole (PROTONIX) 40 MG tablet Take 40 mg by mouth daily.  . rosuvastatin (CRESTOR) 5 MG tablet Take 1 tablet (5 mg  total) by mouth daily.  Marland Kitchen triamcinolone cream (KENALOG) 0.1 % APPLY A SMALL AMOUNT TO SKIN TWICE DAILY   No current facility-administered medications for this visit. (Other)      REVIEW OF SYSTEMS:    ALLERGIES Allergies  Allergen Reactions  . Penicillins Hives    Has patient had a PCN reaction causing immediate rash, facial/tongue/throat swelling, SOB or lightheadedness with hypotension: No Has patient had a PCN reaction causing severe rash involving mucus membranes or skin necrosis: No Has patient had a PCN reaction that required hospitalization: No Has patient had a PCN reaction occurring within the last 10 years: Yes If all of the above answers are "NO", then may proceed with Cephalosporin use.   .  Sulfa Antibiotics Other (See Comments)    Unknown to patient   . Latex Hives and Rash    PAST MEDICAL HISTORY Past Medical History:  Diagnosis Date  . Anxiety   . Asthma   . Asthmatic bronchitis   . Atrial flutter (HCC)    chads2vasc score is 3  . Ehrlichiosis 9147  . GERD (gastroesophageal reflux disease)   . Granulomatosis with polyangiitis ([place])   . HTN (hypertension)   . Hyperlipidemia   . Paroxysmal atrial fibrillation (HCC)   . Prostate cancer ([place])   . Seasonal allergies    Past Surgical History:  Procedure Laterality Date  . BASAL CELL CARCINOMA EXCISION  2013   EYELID  . HERNIA REPAIR Bilateral 2000, 2002  . KNEE ARTHROSCOPY  2000  . prostectomy  1995   RADICAL  . TONSILLECTOMY  1940    FAMILY HISTORY Family History  Problem Relation Age of Onset  . Asthma Mother 2  . Allergies Mother   . Ulcers Mother   . CVA Father 47  . Heart Problems Father   . Other Father        NEUROLOGIC ISSUES   . Other Brother 11       BICYCLE ACCIDENT  . Migraines Son   . Diabetes Mellitus I Son     SOCIAL HISTORY Social History   Tobacco Use  . Smoking status: Former Smoker    Packs/day: 0.50    Years: 4.00    Pack years: 2.00    Types: Cigarettes    Quit date: 02/28/1961    Years since quitting: 58.8  . Smokeless tobacco: Never Used  Vaping Use  . Vaping Use: Never used  Substance Use Topics  . Alcohol use: Yes    Alcohol/week: 2.0 - 3.0 standard drinks    Types: 2 - 3 Standard drinks or equivalent per week  . Drug use: No         OPHTHALMIC EXAM: Base Eye Exam    Visual Acuity (Snellen - Linear)      Right Left   Dist [place] 20/40 -1 20/30 -2   Dist ph [place] NI        Tonometry (Tonopen, 10:34 AM)      Right Left   Pressure 10 13       Pupils      Pupils Dark Light Shape React APD   Right PERRL 4 3 Irregular Slow None   Left PERRL 4 3 Round Slow None       Visual Fields (Counting fingers)      Left Right    Full Full       Neuro/Psych     Oriented x3: Yes   Mood/Affect: Normal       Dilation  Right eye: 1.0% Mydriacyl, 2.5% Phenylephrine @ 10:34 AM        Slit Lamp and Fundus Exam    External Exam      Right Left   External Normal Normal       Slit Lamp Exam      Right Left   Lids/Lashes Normal Normal   Conjunctiva/Sclera White and quiet White and quiet   Cornea Clear Clear   Anterior Chamber Deep and quiet has not, no remaining vitreous strand to the cornea Deep and quiet   Iris Round and reactive Round and reactive   Lens Posterior chamber intraocular lens 3+ Nuclear sclerosis   Anterior Vitreous Normal Normal       Fundus Exam      Right Left   Posterior Vitreous Posterior vitreous detachment    Disc Normal    C/D Ratio 0.65    Macula Cystoid macular edema, Soft drusen in the FAZ, Epiretinal membrane, Retinal pigment epithelial mottling    Vessels Normal    Periphery Normal           IMAGING AND PROCEDURES  Imaging and Procedures for 12/17/19  OCT, Retina - OU - Both Eyes       Right Eye Quality was good. Scan locations included subfoveal. Central Foveal Thickness: 417. Progression has been stable. Findings include subretinal hyper-reflective material, cystoid macular edema, epiretinal membrane.   Left Eye Quality was good. Scan locations included subfoveal. Central Foveal Thickness: 337. Progression has been stable. Findings include subretinal hyper-reflective material.   Notes Much less CME OD, status post Lucentis and also status post YAG vitreal lysis right eye       Intravitreal Injection, Pharmacologic Agent - OD - Right Eye       Time Out 12/17/2019. 11:12 AM. Confirmed correct patient, procedure, site, and patient consented.   Anesthesia Topical anesthesia was used. Anesthetic medications included [place] 3.5%.   Procedure Preparation included Ofloxacin , 10% betadine to eyelids, 5% betadine to ocular surface.   Injection:  0.5 mg ranibizumab (LUCENTIS) 0.5 MG/0.05ML  intravitreal injection   NDC: 50242-080-01, Lot: [id number]   Route: Intravitreal, Site: Right Eye, Waste: 0 mg  Post-op Post injection exam found visual acuity of at least counting fingers. The patient tolerated the procedure well. There were no complications. The patient received written and verbal post procedure care education.                 ASSESSMENT/PLAN:  Cystoid macular edema of right eye Status post YAG vitreal lysis, improved CME still on similar interval examination and delivery of Lucentis  Exudative age-related macular degeneration of right eye with active choroidal neovascularization ([place]) Improving anatomy on intravitreal Lucentis, repeat today at current interval of 6 weeks.      ICD-10-CM   1. Exudative age-related macular degeneration of right eye with active choroidal neovascularization (HCC)  H35.3211 OCT, Retina - OU - Both Eyes    Intravitreal Injection, Pharmacologic Agent - OD - Right Eye    ranibizumab (LUCENTIS) 0.5 MG/0.05ML intravitreal injection 0.5 mg  2. Cystoid macular edema of right eye  H35.351     1.  Repeat intravitreal Lucentis OD today.  2.  Macular CME continues to improve coincidentally also status post YAG vitreal lysis OD  3.  Ophthalmic Meds Ordered this visit:  Meds ordered this encounter  Medications  . ranibizumab (LUCENTIS) 0.5 MG/0.05ML intravitreal injection 0.5 mg       Return in about 5 weeks (around 01/21/2020) for  dilate, OD, LUCENTIS 0.5 OCT.  There are no Patient Instructions on file for this visit.   Explained the diagnoses, plan, and follow up with the patient and they expressed understanding.  Patient expressed understanding of the importance of proper follow up care.   Clent Demark Khalani Novoa M.D. Diseases & Surgery of the Retina and Vitreous [facility] [place] 12/17/19     Abbreviations: M myopia (nearsighted); A astigmatism; H hyperopia (farsighted); P presbyopia; Mrx spectacle prescription;   CTL contact lenses; OD right eye; OS left eye; OU both eyes  XT exotropia; ET esotropia; PEK punctate epithelial keratitis; PEE punctate epithelial erosions; DES dry eye syndrome; MGD meibomian gland dysfunction; ATs artificial tears; PFAT's preservative free artificial tears; [place] nuclear sclerotic cataract; PSC posterior subcapsular cataract; ERM epi-retinal membrane; PVD posterior vitreous detachment; RD retinal detachment; DM diabetes mellitus; DR diabetic retinopathy; NPDR non-proliferative diabetic retinopathy; PDR proliferative diabetic retinopathy; CSME clinically significant macular edema; DME diabetic macular edema; dbh dot blot hemorrhages; CWS cotton wool spot; POAG primary open angle glaucoma; C/D cup-to-disc ratio; HVF humphrey visual field; GVF goldmann visual field; OCT optical coherence tomography; IOP intraocular pressure; BRVO Branch retinal vein occlusion; CRVO central retinal vein occlusion; CRAO central retinal artery occlusion; BRAO branch retinal artery occlusion; RT retinal tear; SB scleral buckle; PPV pars plana vitrectomy; VH Vitreous hemorrhage; PRP panretinal laser photocoagulation; IVK intravitreal kenalog; VMT vitreomacular traction; MH Macular hole;  NVD neovascularization of the disc; NVE neovascularization elsewhere; AREDS age related eye disease study; ARMD age related macular degeneration; POAG primary open angle glaucoma; EBMD epithelial/anterior basement membrane dystrophy; ACIOL anterior chamber intraocular lens; IOL intraocular lens; PCIOL posterior chamber intraocular lens; Phaco/IOL phacoemulsification with intraocular lens placement; [place] photorefractive keratectomy; LASIK laser assisted in situ keratomileusis; HTN hypertension; DM diabetes mellitus; COPD chronic obstructive pulmonary disease

## 2019-12-17 NOTE — Assessment & Plan Note (Signed)
Improving anatomy on intravitreal Lucentis, repeat today at current interval of 6 weeks.

## 2019-12-18 DIAGNOSIS — L570 Actinic keratosis: Secondary | ICD-10-CM | POA: Diagnosis not present

## 2019-12-28 DIAGNOSIS — Z23 Encounter for immunization: Secondary | ICD-10-CM | POA: Diagnosis not present

## 2019-12-30 ENCOUNTER — Encounter (HOSPITAL_COMMUNITY): Payer: Self-pay | Admitting: Physician Assistant

## 2019-12-30 ENCOUNTER — Other Ambulatory Visit: Payer: Self-pay

## 2019-12-30 ENCOUNTER — Ambulatory Visit (HOSPITAL_COMMUNITY)
Admission: RE | Admit: 2019-12-30 | Discharge: 2019-12-30 | Disposition: A | Discharge status: Home / Self Care | Payer: Medicare Other | Source: Ambulatory Visit | Attending: Physician Assistant | Admitting: Physician Assistant

## 2019-12-30 VITALS — BP 140/80 | HR 80 | Ht 69.0 in | Wt 151.4 lb

## 2019-12-30 DIAGNOSIS — I4821 Permanent atrial fibrillation: Secondary | ICD-10-CM

## 2019-12-30 DIAGNOSIS — Z87891 Personal history of nicotine dependence: Secondary | ICD-10-CM | POA: Insufficient documentation

## 2019-12-30 DIAGNOSIS — I350 Nonrheumatic aortic (valve) stenosis: Secondary | ICD-10-CM | POA: Insufficient documentation

## 2019-12-30 DIAGNOSIS — I776 Arteritis, unspecified: Secondary | ICD-10-CM | POA: Diagnosis not present

## 2019-12-30 DIAGNOSIS — Z7901 Long term (current) use of anticoagulants: Secondary | ICD-10-CM | POA: Diagnosis not present

## 2019-12-30 DIAGNOSIS — D6869 Other thrombophilia: Secondary | ICD-10-CM | POA: Diagnosis not present

## 2019-12-30 DIAGNOSIS — I483 Typical atrial flutter: Secondary | ICD-10-CM | POA: Diagnosis not present

## 2019-12-30 NOTE — Progress Notes (Signed)
Primary Care Physician: Chad Morrison, Chad Him, MD Referring Physician: Dr. Rosalyn Charters Chad Morrison is a 86 y.o. male with a h/o typical atrial flutter, on amiodarone for evaluation in the afib clinic. He saw Dr. [name] in August and was c/o of shortness of breath. There was concern of amiodarone toxicity and pt asked to reduce amio to 100 mg a day. He had refused typical atrial flutter ablation in the past. He saw pulmonology and found to be in rapid atrial fibrillation. Dr. [name]  was notified  and he was asked to have f/u here. He increased his amio back to 200 mg. Pulmonology is planning to repeat PFT's when his heart rate is controlled. 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 Pulmonology  questioned if lungs were also involved contributing to increased shortness of breath. He saw Dr. [name] 10/10 Amiodarone was stopped and Multaq was started. He is on Eliquis for a CHADS2VASC score of 3. Multaq was discontinued 2/2 persistent atrial fibrillation.   On follow up today, patient reports he has done well since his last visit. He routinely checks his heart rate, both at rest and with activity, which has been well controlled. He denies any bleeding issues on anticoagulation.   Today, he denies symptoms of palpitations, chest pain, orthopnea, PND, lower extremity edema, dizziness, presyncope, syncope, or neurologic sequela. The patient is tolerating medications without difficulties and is otherwise without complaint today.  Past Medical History:  Diagnosis Date  . Anxiety   . Asthma   . Asthmatic bronchitis   . Atrial flutter (HCC)    chads2vasc score is 3  . Ehrlichiosis 2202  . GERD (gastroesophageal reflux disease)   . Granulomatosis with polyangiitis ([place])   . HTN (hypertension)   . Hyperlipidemia   . Paroxysmal atrial fibrillation (HCC)   . Prostate cancer ([place])   . Seasonal allergies    Past Surgical History:  Procedure Laterality Date  . BASAL CELL  CARCINOMA EXCISION  2013   EYELID  . HERNIA REPAIR Bilateral 2000, 2002  . KNEE ARTHROSCOPY  2000  . prostectomy  1995   RADICAL  . TONSILLECTOMY  1940    Current Outpatient Medications  Medication Sig Dispense Refill  . albuterol (VENTOLIN HFA) 108 (90 Base) MCG/ACT inhaler USE 1 TO 2 INHALATIONS     ORALLY EVERY 6 HOURS AS    NEEDED FOR WHEEZING OR     SHORTNESS OF BREATH 54 g 3  . ALPRAZolam (XANAX) 0.25 MG tablet Take 0.25 mg by mouth at bedtime as needed for sleep.     Marland Kitchen apixaban (ELIQUIS) 5 MG TABS tablet Take 1 tablet (5 mg total) by mouth 2 (two) times daily. 180 tablet 1  . Ascorbic Acid (VITAMIN C PO) Take by mouth. Not sure the dosage    . azelastine (ASTELIN) 0.1 % nasal spray INSTILL 1 SPRAY INTO EACH NOSTRIL TWICE DAILY AS DIRECTED 30 mL 1  . Bromfenac Sodium (PROLENSA) 0.07 % SOLN Apply 1 drop to eye daily.    . budesonide-formoterol (SYMBICORT) 160-4.5 MCG/ACT inhaler Inhale 2 puffs into the lungs in the morning and at bedtime. 3 Inhaler 3  . budesonide-formoterol (SYMBICORT) 160-4.5 MCG/ACT inhaler Inhale 2 puffs into the lungs 2 (two) times daily. 3 each 1  . Calcium Carbonate-Vitamin D (CALCIUM-VITAMIN D) 600-125 MG-UNIT TABS Take 1 tablet by mouth 2 (two) times daily.     Marland Kitchen diltiazem (CARDIZEM CD) 180 MG 24 hr capsule Take 1 capsule (180 mg  total) by mouth in the morning and at bedtime. 180 capsule 1  . diphenhydrAMINE (BENADRYL) 25 MG tablet Take 50 mg by mouth at bedtime as needed.     Marland Kitchen EPINEPHrine 0.3 mg/0.3 mL IJ SOAJ injection SMARTSIG:0.3 Milligram(s) IM Once    . esomeprazole (NEXIUM) 40 MG capsule Take 1 capsule (40 mg total) by mouth 2 (two) times daily before a meal. 180 capsule 3  . Ferrous Sulfate (SLOW FE PO) Take by mouth.    . folic acid (FOLVITE) 1 MG tablet Take 2 mg by mouth daily.     Marland Kitchen levocetirizine (XYZAL) 5 MG tablet Take 1 tablet (5 mg total) by mouth every evening. 90 tablet 3  . levothyroxine (SYNTHROID, LEVOTHROID) 75 MCG tablet Take 75 mcg  by mouth daily before breakfast.    . methotrexate 2.5 MG tablet 6 tabs weekly    . montelukast (SINGULAIR) 10 MG tablet TAKE 1 TABLET AT BEDTIME 90 tablet 3  . Multiple Vitamin (MULTIVITAMIN WITH MINERALS) TABS tablet Take 1 tablet daily by mouth.    . Multiple Vitamins-Minerals (OCUVITE PRESERVISION PO) Take 1 tablet 2 (two) times daily by mouth.    . pantoprazole (PROTONIX) 40 MG tablet Take 40 mg by mouth daily.    . rosuvastatin (CRESTOR) 5 MG tablet Take 1 tablet (5 mg total) by mouth daily. 90 tablet 1   No current facility-administered medications for this encounter.    Allergies  Allergen Reactions  . Penicillins Hives    Has patient had a PCN reaction causing immediate rash, facial/tongue/throat swelling, SOB or lightheadedness with hypotension: No Has patient had a PCN reaction causing severe rash involving mucus membranes or skin necrosis: No Has patient had a PCN reaction that required hospitalization: No Has patient had a PCN reaction occurring within the last 10 years: Yes If all of the above answers are "NO", then may proceed with Cephalosporin use.   . Sulfa Antibiotics Other (See Comments)    Unknown to patient   . Latex Hives and Rash    Social History   Socioeconomic History  . Marital status: Married    Spouse name: Chad Morrison  . Number of children: 2  . Years of education: Not on file  . Highest education level: Not on file  Occupational History  . Occupation: RETIRED  Tobacco Use  . Smoking status: Former Smoker    Packs/day: 0.50    Years: 4.00    Pack years: 2.00    Types: Cigarettes    Quit date: 02/28/1961    Years since quitting: 58.8  . Smokeless tobacco: Never Used  Vaping Use  . Vaping Use: Never used  Substance and Sexual Activity  . Alcohol use: Yes    Alcohol/week: 2.0 - 3.0 standard drinks    Types: 2 - 3 Standard drinks or equivalent per week  . Drug use: No  . Sexual activity: Not on file  Other Topics Concern  . Not on file   Social History Narrative   Pt recently moved to [place] from [place] to be near his son.   Retired.   Social Determinants of Health   Financial Resource Strain:   . Difficulty of Paying Living Expenses: Not on file  Food Insecurity:   . Worried About Charity fundraiser in the Last Year: Not on file  . Ran Out of Food in the Last Year: Not on file  Transportation Needs:   . Lack of Transportation (Medical): Not on file  . Lack  of Transportation (Non-Medical): Not on file  Physical Activity:   . Days of Exercise per Week: Not on file  . Minutes of Exercise per Session: Not on file  Stress:   . Feeling of Stress : Not on file  Social Connections:   . Frequency of Communication with Friends and Family: Not on file  . Frequency of Social Gatherings with Friends and Family: Not on file  . Attends Religious Services: Not on file  . Active Member of Clubs or Organizations: Not on file  . Attends Archivist Meetings: Not on file  . Marital Status: Not on file  Intimate Partner Violence:   . Fear of Current or Ex-Partner: Not on file  . Emotionally Abused: Not on file  . Physically Abused: Not on file  . Sexually Abused: Not on file    Family History  Problem Relation Age of Onset  . Asthma Mother 9  . Allergies Mother   . Ulcers Mother   . CVA Father 45  . Heart Problems Father   . Other Father        NEUROLOGIC ISSUES   . Other Brother 11       BICYCLE ACCIDENT  . Migraines Son   . Diabetes Mellitus I Son     ROS- All systems are reviewed and negative except as per the HPI above  Physical Exam: Vitals:   12/30/19 1106  BP: 140/80  Pulse: 80  Weight: 68.7 kg  Height: 5\' 9"  (1.753 m)   Wt Readings from Last 3 Encounters:  12/30/19 68.7 kg  12/05/19 67.8 kg  10/22/19 67.1 kg    Labs: Lab Results  Component Value Date   NA 139 09/16/2019   K 3.7 09/16/2019   CL 105 09/16/2019   CO2 24 09/16/2019   GLUCOSE 120 (H) 09/16/2019   BUN 18  09/16/2019   CREATININE 1.36 (H) 09/16/2019   CALCIUM 9.4 09/16/2019   No results found for: INR No results found for: CHOL, HDL, LDLCALC, TRIG   GEN- The patient is well appearing elderly male, alert and oriented x 3 today.   HEENT-head normocephalic, atraumatic, sclera clear, conjunctiva pink, hearing intact, trachea midline. Lungs- Clear to ausculation bilaterally, normal work of breathing Heart- irregular rate and rhythm, no murmurs, rubs or gallops  GI- soft, NT, ND, + BS Extremities- no clubbing, cyanosis, or edema MS- no significant deformity or atrophy Skin- no rash or lesion Psych- euthymic mood, full affect Neuro- strength and sensation are intact   EKG- afib HR 80, RBBB, QRS 164, QTc 514  Epic records reviewed  Echo 10/23/17 - Left ventricle: The cavity size was normal. There was mild   concentric hypertrophy. Systolic function was normal. The   estimated ejection fraction was in the range of 60% to 65%. Wall   motion was normal; there were no regional wall motion   abnormalities. Features are consistent with a pseudonormal left   ventricular filling pattern, with concomitant abnormal relaxation   and increased filling pressure (grade 2 diastolic dysfunction).   Doppler parameters are consistent with indeterminate ventricular   filling pressure. - Aortic valve: Valve mobility was restricted. There was moderate   stenosis. - Aorta: Ascending aortic diameter: 37 mm (S). - Ascending aorta: The ascending aorta was mildly dilated. - Mitral valve: Transvalvular velocity was within the normal range.   There was no evidence for stenosis. There was mild regurgitation. - Left atrium: The atrium was severely dilated. - Right ventricle: The cavity  size was normal. Wall thickness was   normal. Systolic function was normal. - Right atrium: The atrium was mildly dilated. - Atrial septum: No defect or patent foramen ovale was identified   by color flow Doppler. - Tricuspid  valve: There was mild regurgitation. - Pulmonary arteries: Systolic pressure was within the normal   range. PA peak pressure: 35 mm Hg (S).   Assessment and Plan: 1. Permanent atrial fibrillation/typical atrial flutter Patient asymptomatic and rate controlled. 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   3. Aortic Stenosis Moderate by Echo 12/20/18.   Follow up in the AF clinic in 6 months.    [facility] [address] [place], [place] [address] [phone]

## 2020-01-01 DIAGNOSIS — J4550 Severe persistent asthma, uncomplicated: Secondary | ICD-10-CM | POA: Diagnosis not present

## 2020-01-02 ENCOUNTER — Other Ambulatory Visit: Payer: Self-pay

## 2020-01-02 ENCOUNTER — Ambulatory Visit (INDEPENDENT_AMBULATORY_CARE_PROVIDER_SITE_OTHER): Payer: Medicare Other

## 2020-01-02 DIAGNOSIS — J4550 Severe persistent asthma, uncomplicated: Secondary | ICD-10-CM | POA: Diagnosis not present

## 2020-01-02 MED ORDER — BENRALIZUMAB 30 MG/ML ~~LOC~~ SOSY
30.0000 mg | PREFILLED_SYRINGE | SUBCUTANEOUS | Status: AC
  Administered 2020-01-02: 30 mg via SUBCUTANEOUS

## 2020-01-02 MED ORDER — BENRALIZUMAB 30 MG/ML ~~LOC~~ SOSY
30.0000 mg | PREFILLED_SYRINGE | SUBCUTANEOUS | Status: DC
  Administered 2020-01-30 – 2022-04-05 (×13): 30 mg via SUBCUTANEOUS

## 2020-01-06 ENCOUNTER — Telehealth: Payer: Self-pay | Admitting: Allergy and Immunology

## 2020-01-06 NOTE — Telephone Encounter (Signed)
Spoke with patient's wife. He is going to his PCP tomorrow and she was looking over this last two after visit summary's for his visits with Dr. Neldon Mc. She noticed one stated to get some blood work done and she wasn't sure if that is what he had already did or if it was something his primary care needed to do. Wife was informed that those lads were already done and I gave the results again which she remember. She also wanted to be clear of the vaccines he needed to get which are the pneumovax and Diphtheria. Wife verbalized understand, she was very thankful for the clarification and call back.

## 2020-01-06 NOTE — Telephone Encounter (Signed)
Patient wife called and would like for dr Neldon Mc nurse to call her about some blood work. [phone]

## 2020-01-07 DIAGNOSIS — E7800 Pure hypercholesterolemia, unspecified: Secondary | ICD-10-CM | POA: Diagnosis not present

## 2020-01-07 DIAGNOSIS — J849 Interstitial pulmonary disease, unspecified: Secondary | ICD-10-CM | POA: Diagnosis not present

## 2020-01-07 DIAGNOSIS — J4540 Moderate persistent asthma, uncomplicated: Secondary | ICD-10-CM | POA: Diagnosis not present

## 2020-01-07 DIAGNOSIS — M3130 Wegener's granulomatosis without renal involvement: Secondary | ICD-10-CM | POA: Diagnosis not present

## 2020-01-07 DIAGNOSIS — N393 Stress incontinence (female) (male): Secondary | ICD-10-CM | POA: Diagnosis not present

## 2020-01-07 DIAGNOSIS — I4892 Unspecified atrial flutter: Secondary | ICD-10-CM | POA: Diagnosis not present

## 2020-01-07 DIAGNOSIS — R946 Abnormal results of thyroid function studies: Secondary | ICD-10-CM | POA: Diagnosis not present

## 2020-01-07 DIAGNOSIS — G4700 Insomnia, unspecified: Secondary | ICD-10-CM | POA: Diagnosis not present

## 2020-01-07 DIAGNOSIS — Z7901 Long term (current) use of anticoagulants: Secondary | ICD-10-CM | POA: Diagnosis not present

## 2020-01-07 DIAGNOSIS — J302 Other seasonal allergic rhinitis: Secondary | ICD-10-CM | POA: Diagnosis not present

## 2020-01-07 DIAGNOSIS — R2689 Other abnormalities of gait and mobility: Secondary | ICD-10-CM | POA: Diagnosis not present

## 2020-01-07 DIAGNOSIS — D8989 Other specified disorders involving the immune mechanism, not elsewhere classified: Secondary | ICD-10-CM | POA: Diagnosis not present

## 2020-01-21 ENCOUNTER — Encounter (INDEPENDENT_AMBULATORY_CARE_PROVIDER_SITE_OTHER): Payer: Self-pay | Admitting: Ophthalmology

## 2020-01-21 ENCOUNTER — Ambulatory Visit (INDEPENDENT_AMBULATORY_CARE_PROVIDER_SITE_OTHER): Payer: Medicare Other | Admitting: Ophthalmology

## 2020-01-21 ENCOUNTER — Other Ambulatory Visit: Payer: Self-pay

## 2020-01-21 ENCOUNTER — Ambulatory Visit: Payer: Medicare Other | Admitting: Allergy and Immunology

## 2020-01-21 DIAGNOSIS — H353211 Exudative age-related macular degeneration, right eye, with active choroidal neovascularization: Secondary | ICD-10-CM

## 2020-01-21 DIAGNOSIS — H35371 Puckering of macula, right eye: Secondary | ICD-10-CM

## 2020-01-21 DIAGNOSIS — H353132 Nonexudative age-related macular degeneration, bilateral, intermediate dry stage: Secondary | ICD-10-CM

## 2020-01-21 DIAGNOSIS — H35351 Cystoid macular degeneration, right eye: Secondary | ICD-10-CM | POA: Diagnosis not present

## 2020-01-21 MED ORDER — RANIBIZUMAB 0.5 MG/0.05ML IZ SOLN FOR KALEIDOSCOPE
0.5000 mg | INTRAVITREAL | Status: AC | PRN
  Administered 2020-01-21: .5 mg via INTRAVITREAL

## 2020-01-21 NOTE — Assessment & Plan Note (Signed)

## 2020-01-21 NOTE — Assessment & Plan Note (Signed)
Persistent, etiology may be [employer] syndrome, could be epiretinal membrane, each complicated by underlying vascular disease of Wegener's granulomatosis

## 2020-01-21 NOTE — Assessment & Plan Note (Signed)
Overall stable and its clinical appearance and on OCT

## 2020-01-21 NOTE — Progress Notes (Signed)
01/21/2020     CHIEF COMPLAINT Patient presents for Retina Follow Up   HISTORY OF PRESENT ILLNESS: Chad Morrison is a 86 y.o. 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  This started 5 weeks ago.  Severity is mild.  Duration of 5 weeks.  Since onset it is stable.          Comments    5 Week AMD F/U OD, poss Luc 0.5 OD  Pt denies noticeable changes to [place] OU since last visit. Pt denies ocular pain, flashes of light, or floaters OU.         Last edited by Rockie Neighbours, [place] on 01/21/2020  9:50 AM. (History)      Referring physician: Haywood Pao, MD [place],  [place] [address]  HISTORICAL INFORMATION:   Selected notes from the MEDICAL RECORD NUMBER       CURRENT MEDICATIONS: Current Outpatient Medications (Ophthalmic Drugs)  Medication Sig  . Bromfenac Sodium (PROLENSA) 0.07 % SOLN Apply 1 drop to eye daily.   No current facility-administered medications for this visit. (Ophthalmic Drugs)   Current Outpatient Medications (Other)  Medication Sig  . albuterol (VENTOLIN HFA) 108 (90 Base) MCG/ACT inhaler USE 1 TO 2 INHALATIONS     ORALLY EVERY 6 HOURS AS    NEEDED FOR WHEEZING OR     SHORTNESS OF BREATH  . ALPRAZolam (XANAX) 0.25 MG tablet Take 0.25 mg by mouth at bedtime as needed for sleep.   Marland Kitchen apixaban (ELIQUIS) 5 MG TABS tablet Take 1 tablet (5 mg total) by mouth 2 (two) times daily.  . Ascorbic Acid (VITAMIN C PO) Take by mouth. Not sure the dosage  . azelastine (ASTELIN) 0.1 % nasal spray INSTILL 1 SPRAY INTO EACH NOSTRIL TWICE DAILY AS DIRECTED  . budesonide-formoterol (SYMBICORT) 160-4.5 MCG/ACT inhaler Inhale 2 puffs into the lungs in the morning and at bedtime.  . budesonide-formoterol (SYMBICORT) 160-4.5 MCG/ACT inhaler Inhale 2 puffs into the lungs 2 (two) times daily.  . Calcium Carbonate-Vitamin D (CALCIUM-VITAMIN D) 600-125 MG-UNIT TABS Take 1 tablet by mouth  2 (two) times daily.   Marland Kitchen diltiazem (CARDIZEM CD) 180 MG 24 hr capsule Take 1 capsule (180 mg total) by mouth in the morning and at bedtime.  . diphenhydrAMINE (BENADRYL) 25 MG tablet Take 50 mg by mouth at bedtime as needed.   Marland Kitchen EPINEPHrine 0.3 mg/0.3 mL IJ SOAJ injection SMARTSIG:0.3 Milligram(s) IM Once  . esomeprazole (NEXIUM) 40 MG capsule Take 1 capsule (40 mg total) by mouth 2 (two) times daily before a meal.  . Ferrous Sulfate (SLOW FE PO) Take by mouth.  . folic acid (FOLVITE) 1 MG tablet Take 2 mg by mouth daily.   Marland Kitchen levocetirizine (XYZAL) 5 MG tablet Take 1 tablet (5 mg total) by mouth every evening.  Marland Kitchen levothyroxine (SYNTHROID, LEVOTHROID) 75 MCG tablet Take 75 mcg by mouth daily before breakfast.  . methotrexate 2.5 MG tablet 6 tabs weekly  . montelukast (SINGULAIR) 10 MG tablet TAKE 1 TABLET AT BEDTIME  . Multiple Vitamin (MULTIVITAMIN WITH MINERALS) TABS tablet Take 1 tablet daily by mouth.  . Multiple Vitamins-Minerals (OCUVITE PRESERVISION PO) Take 1 tablet 2 (two) times daily by mouth.  . pantoprazole (PROTONIX) 40 MG tablet Take 40 mg by mouth daily.  . rosuvastatin (CRESTOR) 5 MG tablet Take 1 tablet (5 mg total) by  mouth daily.   Current Facility-Administered Medications (Other)  Medication Route  . Benralizumab SOSY 30 mg Subcutaneous  . [START ON 01/30/2020] Benralizumab SOSY 30 mg Subcutaneous      REVIEW OF SYSTEMS:    ALLERGIES Allergies  Allergen Reactions  . Penicillins Hives    Has patient had a PCN reaction causing immediate rash, facial/tongue/throat swelling, SOB or lightheadedness with hypotension: No Has patient had a PCN reaction causing severe rash involving mucus membranes or skin necrosis: No Has patient had a PCN reaction that required hospitalization: No Has patient had a PCN reaction occurring within the last 10 years: Yes If all of the above answers are "NO", then may proceed with Cephalosporin use.   . Sulfa Antibiotics Other (See  Comments)    Unknown to patient   . Latex Hives and Rash    PAST MEDICAL HISTORY Past Medical History:  Diagnosis Date  . Anxiety   . Asthma   . Asthmatic bronchitis   . Atrial flutter (HCC)    chads2vasc score is 3  . Ehrlichiosis 3235  . GERD (gastroesophageal reflux disease)   . Granulomatosis with polyangiitis ([place])   . HTN (hypertension)   . Hyperlipidemia   . Paroxysmal atrial fibrillation (HCC)   . Prostate cancer ([place])   . Seasonal allergies    Past Surgical History:  Procedure Laterality Date  . BASAL CELL CARCINOMA EXCISION  2013   EYELID  . HERNIA REPAIR Bilateral 2000, 2002  . KNEE ARTHROSCOPY  2000  . prostectomy  1995   RADICAL  . TONSILLECTOMY  1940    FAMILY HISTORY Family History  Problem Relation Age of Onset  . Asthma Mother 42  . Allergies Mother   . Ulcers Mother   . CVA Father 65  . Heart Problems Father   . Other Father        NEUROLOGIC ISSUES   . Other Brother 11       BICYCLE ACCIDENT  . Migraines Son   . Diabetes Mellitus I Son     SOCIAL HISTORY Social History   Tobacco Use  . Smoking status: Former Smoker    Packs/day: 0.50    Years: 4.00    Pack years: 2.00    Types: Cigarettes    Quit date: 02/28/1961    Years since quitting: 58.9  . Smokeless tobacco: Never Used  Vaping Use  . Vaping Use: Never used  Substance Use Topics  . Alcohol use: Yes    Alcohol/week: 2.0 - 3.0 standard drinks    Types: 2 - 3 Standard drinks or equivalent per week  . Drug use: No         OPHTHALMIC EXAM:  Base Eye Exam    Visual Acuity (ETDRS)      Right Left   Dist [place] 20/40 +1 20/30 +1   Dist ph [place] NI NI       Tonometry (Tonopen, 9:50 AM)      Right Left   Pressure 12 15       Pupils      Dark Light Shape React APD   Right 4 3 Irregular Slow None   Left 4 3 Round Slow None       Visual Fields (Counting fingers)      Left Right    Full Full       Extraocular Movement      Right Left    Full Full        Neuro/Psych  Oriented x3: Yes   Mood/Affect: Normal       Dilation    Right eye: 1.0% Mydriacyl, 2.5% Phenylephrine @ 9:54 AM        Slit Lamp and Fundus Exam    External Exam      Right Left   External Normal Normal       Slit Lamp Exam      Right Left   Lids/Lashes Normal Normal   Conjunctiva/Sclera White and quiet White and quiet   Cornea Clear Clear   Anterior Chamber Deep and quiet has not, no remaining vitreous strand to the cornea Deep and quiet   Iris Round and reactive Round and reactive   Lens Posterior chamber intraocular lens 3+ Nuclear sclerosis   Anterior Vitreous Normal Normal       Fundus Exam      Right Left   Posterior Vitreous Posterior vitreous detachment    Disc Normal    C/D Ratio 0.65    Macula Cystoid macular edema, Soft drusen in the FAZ, Epiretinal membrane, Retinal pigment epithelial mottling    Vessels Normal    Periphery Normal           IMAGING AND PROCEDURES  Imaging and Procedures for 01/21/20  OCT, Retina - OU - Both Eyes       Right Eye Quality was good. Scan locations included subfoveal. Central Foveal Thickness: 446. Findings include macular pucker, cystoid macular edema, epiretinal membrane.   Left Eye Quality was good. Scan locations included subfoveal. Central Foveal Thickness: 346. Progression has been stable. Findings include retinal drusen .   Notes Chronic CME, overall stable with epiretinal membrane right eye. D currently at 5-week post intravitreal Lucentis. We'll repeat today and extend the interval to determine if the disease is controlled or improved or stabilized by the medication       Intravitreal Injection, Pharmacologic Agent - OD - Right Eye       Time Out 01/21/2020. 10:26 AM. Confirmed correct patient, procedure, site, and patient consented.   Anesthesia Topical anesthesia was used. Anesthetic medications included [place] 3.5%.   Procedure Preparation included Ofloxacin , 10% betadine to  eyelids, 5% betadine to ocular surface.   Injection:  0.5 mg ranibizumab (LUCENTIS) 0.5 MG/0.05ML intravitreal injection   NDC: 50242-080-01   Route: Intravitreal, Site: Right Eye, Waste: 0 mg  Post-op Post injection exam found visual acuity of at least counting fingers. The patient tolerated the procedure well. There were no complications. The patient received written and verbal post procedure care education. Post injection medications were not given.                 ASSESSMENT/PLAN:  Intermediate stage nonexudative age-related macular degeneration of both eyes The nature of age--related macular degeneration was discussed with the patient as well as the distinction between dry and wet types. Checking an Amsler Grid daily with advice to return immediately should a distortion develop, was given to the patient. The patient 's smoking status now and in the past was determined and advice based on the AREDS study was provided regarding the consumption of antioxidant supplements. AREDS 2 vitamin formulation was recommended. Consumption of dark leafy vegetables and fresh fruits of various colors was recommended. Treatment modalities for wet macular degeneration particularly the use of intravitreal injections of anti-blood vessel growth factors was discussed with the patient. Avastin, Lucentis, and Eylea are the available options. On occasion, therapy includes the use of photodynamic therapy and thermal laser. Stressed to the  patient do not rub eyes.  Patient was advised to check Amsler Grid daily and return immediately if changes are noted. Instructions on using the grid were given to the patient. All patient questions were answered.  Right epiretinal membrane Overall stable and its clinical appearance and on OCT  Cystoid macular edema of right eye Persistent, etiology may be [employer] syndrome, could be epiretinal membrane, each complicated by underlying vascular disease of Wegener's  granulomatosis      ICD-10-CM   1. Exudative age-related macular degeneration of right eye with active choroidal neovascularization (HCC)  H35.3211 OCT, Retina - OU - Both Eyes    Intravitreal Injection, Pharmacologic Agent - OD - Right Eye    ranibizumab (LUCENTIS) 0.5 MG/0.05ML intravitreal injection 0.5 mg  2. Intermediate stage nonexudative age-related macular degeneration of both eyes  H35.3132   3. Right epiretinal membrane  H35.371   4. Cystoid macular edema of right eye  H35.351     1.  2.  3.  Ophthalmic Meds Ordered this visit:  Meds ordered this encounter  Medications  . ranibizumab (LUCENTIS) 0.5 MG/0.05ML intravitreal injection 0.5 mg       Return in about 6 weeks (around 03/03/2020) for dilate, OD, LUCENTIS 0.5 OCT.  There are no Patient Instructions on file for this visit.   Explained the diagnoses, plan, and follow up with the patient and they expressed understanding.  Patient expressed understanding of the importance of proper follow up care.   Clent Demark Adaeze Better M.D. Diseases & Surgery of the Retina and Vitreous [facility] [place] 01/21/20     Abbreviations: M myopia (nearsighted); A astigmatism; H hyperopia (farsighted); P presbyopia; Mrx spectacle prescription;  CTL contact lenses; OD right eye; OS left eye; OU both eyes  XT exotropia; ET esotropia; PEK punctate epithelial keratitis; PEE punctate epithelial erosions; DES dry eye syndrome; MGD meibomian gland dysfunction; ATs artificial tears; PFAT's preservative free artificial tears; [place] nuclear sclerotic cataract; PSC posterior subcapsular cataract; ERM epi-retinal membrane; PVD posterior vitreous detachment; RD retinal detachment; DM diabetes mellitus; DR diabetic retinopathy; NPDR non-proliferative diabetic retinopathy; PDR proliferative diabetic retinopathy; CSME clinically significant macular edema; DME diabetic macular edema; dbh dot blot hemorrhages; CWS cotton wool spot; POAG primary open  angle glaucoma; C/D cup-to-disc ratio; HVF humphrey visual field; GVF goldmann visual field; OCT optical coherence tomography; IOP intraocular pressure; BRVO Branch retinal vein occlusion; CRVO central retinal vein occlusion; CRAO central retinal artery occlusion; BRAO branch retinal artery occlusion; RT retinal tear; SB scleral buckle; PPV pars plana vitrectomy; VH Vitreous hemorrhage; PRP panretinal laser photocoagulation; IVK intravitreal kenalog; VMT vitreomacular traction; MH Macular hole;  NVD neovascularization of the disc; NVE neovascularization elsewhere; AREDS age related eye disease study; ARMD age related macular degeneration; POAG primary open angle glaucoma; EBMD epithelial/anterior basement membrane dystrophy; ACIOL anterior chamber intraocular lens; IOL intraocular lens; PCIOL posterior chamber intraocular lens; Phaco/IOL phacoemulsification with intraocular lens placement; [place] photorefractive keratectomy; LASIK laser assisted in situ keratomileusis; HTN hypertension; DM diabetes mellitus; COPD chronic obstructive pulmonary disease

## 2020-01-28 ENCOUNTER — Ambulatory Visit (INDEPENDENT_AMBULATORY_CARE_PROVIDER_SITE_OTHER): Payer: Medicare Other | Admitting: Allergy and Immunology

## 2020-01-28 ENCOUNTER — Other Ambulatory Visit: Payer: Self-pay

## 2020-01-28 ENCOUNTER — Encounter: Payer: Self-pay | Admitting: Allergy and Immunology

## 2020-01-28 VITALS — BP 154/76 | HR 83 | Temp 97.8°F | Resp 18

## 2020-01-28 DIAGNOSIS — D801 Nonfamilial hypogammaglobulinemia: Secondary | ICD-10-CM

## 2020-01-28 DIAGNOSIS — D849 Immunodeficiency, unspecified: Secondary | ICD-10-CM | POA: Diagnosis not present

## 2020-01-28 DIAGNOSIS — J4550 Severe persistent asthma, uncomplicated: Secondary | ICD-10-CM | POA: Diagnosis not present

## 2020-01-28 DIAGNOSIS — J3089 Other allergic rhinitis: Secondary | ICD-10-CM

## 2020-01-28 DIAGNOSIS — M3130 Wegener's granulomatosis without renal involvement: Secondary | ICD-10-CM | POA: Diagnosis not present

## 2020-01-28 DIAGNOSIS — K219 Gastro-esophageal reflux disease without esophagitis: Secondary | ICD-10-CM | POA: Diagnosis not present

## 2020-01-28 DIAGNOSIS — D7219 Other eosinophilia: Secondary | ICD-10-CM | POA: Diagnosis not present

## 2020-01-28 DIAGNOSIS — J324 Chronic pansinusitis: Secondary | ICD-10-CM

## 2020-01-28 NOTE — Progress Notes (Signed)
[facility]   Follow-up Note  Referring Provider: Tisovec, Chad Him, MD Primary Provider: Haywood Pao, MD Date of Office Visit: 01/28/2020  Subjective:   Chad Morrison (DOB: 01/22/1933) is a 86 y.o. male who returns to the [facility] on 01/28/2020 in re-evaluation of the following:  HPI: Gene returns to this clinic in evaluation of eosinophilic driven airway disease including asthma, chronic sinusitis, and a history of LPR, and a history of Wegener's granulomatosis treated with methotrexate and a history of hypogammaglobulinemia.  I last saw him in this clinic on 26 November 2019.  During that last visit we started him on benralizumab injections.  He can really tell a big difference while using benralizumab injections regarding both his head and chest.  He feels much clearer in his head and is much clearer in his chest and he is now just using a short acting bronchodilator 1 time per day as habit and he does not really have much shortness of breath or wheezing or coughing.  He continues to use his Symbicort and montelukast but no longer requires a nasal steroid.  As well, when we increased his Nexium to twice a day during his last visit he has really noticed a big improvement regarding his reflux.  In fact, he does not believe that he has any reflux symptoms at this point in time while utilizing Nexium at 40 mg twice a day.  He did not receive his Pneumovax or his DT vaccination as he was told that he is "up-to-date" on these vaccines.  However, we have documented that he has no antibodies against diphtheria and he only has 50% antibody coverage from his previous Pneumovax.  Allergies as of 01/28/2020      Reactions   Penicillins Hives   Has patient had a PCN reaction causing immediate rash, facial/tongue/throat swelling, SOB or lightheadedness with hypotension: No Has patient had a PCN reaction causing severe rash  involving mucus membranes or skin necrosis: No Has patient had a PCN reaction that required hospitalization: No Has patient had a PCN reaction occurring within the last 10 years: Yes If all of the above answers are "NO", then may proceed with Cephalosporin use.   Sulfa Antibiotics Other (See Comments)   Unknown to patient   Latex Hives, Rash      Medication List    albuterol 108 (90 Base) MCG/ACT inhaler Commonly known as: VENTOLIN HFA USE 1 TO 2 INHALATIONS     ORALLY EVERY 6 HOURS AS    NEEDED FOR WHEEZING OR     SHORTNESS OF BREATH   ALPRAZolam 0.25 MG tablet Commonly known as: XANAX Take 0.25 mg by mouth at bedtime as needed for sleep.   apixaban 5 MG Tabs tablet Commonly known as: Eliquis Take 1 tablet (5 mg total) by mouth 2 (two) times daily.   azelastine 0.1 % nasal spray Commonly known as: ASTELIN INSTILL 1 SPRAY INTO EACH NOSTRIL TWICE DAILY AS DIRECTED   budesonide-formoterol 160-4.5 MCG/ACT inhaler Commonly known as: Symbicort Inhale 2 puffs into the lungs in the morning and at bedtime.   budesonide-formoterol 160-4.5 MCG/ACT inhaler Commonly known as: Symbicort Inhale 2 puffs into the lungs 2 (two) times daily.   Calcium-Vitamin D 600-125 MG-UNIT Tabs Take 1 tablet by mouth 2 (two) times daily.   diltiazem 180 MG 24 hr capsule Commonly known as: Cardizem CD Take 1 capsule (180 mg total) by mouth in the morning and  at bedtime.   diphenhydrAMINE 25 MG tablet Commonly known as: BENADRYL Take 50 mg by mouth at bedtime as needed.   EPINEPHrine 0.3 mg/0.3 mL Soaj injection Commonly known as: EPI-PEN SMARTSIG:0.3 Milligram(s) IM Once   esomeprazole 40 MG capsule Commonly known as: NexIUM Take 1 capsule (40 mg total) by mouth 2 (two) times daily before a meal.   folic acid 1 MG tablet Commonly known as: FOLVITE Take 2 mg by mouth daily.   levocetirizine 5 MG tablet Commonly known as: XYZAL Take 1 tablet (5 mg total) by mouth every evening.     levothyroxine 75 MCG tablet Commonly known as: SYNTHROID Take 75 mcg by mouth daily before breakfast.   methotrexate 2.5 MG tablet 6 tabs weekly   montelukast 10 MG tablet Commonly known as: SINGULAIR TAKE 1 TABLET AT BEDTIME   multivitamin with minerals Tabs tablet Take 1 tablet daily by mouth.   OCUVITE PRESERVISION PO Take 1 tablet 2 (two) times daily by mouth.   Prolensa 0.07 % Soln Generic drug: Bromfenac Sodium Apply 1 drop to eye daily.   rosuvastatin 5 MG tablet Commonly known as: Crestor Take 1 tablet (5 mg total) by mouth daily.   SLOW FE PO Take by mouth.   VITAMIN C PO Take by mouth. Not sure the dosage       Past Medical History:  Diagnosis Date  . Anxiety   . Asthma   . Asthmatic bronchitis   . Atrial flutter (HCC)    chads2vasc score is 3  . Ehrlichiosis 2706  . GERD (gastroesophageal reflux disease)   . Granulomatosis with polyangiitis ([place])   . HTN (hypertension)   . Hyperlipidemia   . Paroxysmal atrial fibrillation (HCC)   . Prostate cancer ([place])   . Seasonal allergies     Past Surgical History:  Procedure Laterality Date  . BASAL CELL CARCINOMA EXCISION  2013   EYELID  . HERNIA REPAIR Bilateral 2000, 2002  . KNEE ARTHROSCOPY  2000  . prostectomy  1995   RADICAL  . TONSILLECTOMY  1940    Review of systems negative except as noted in HPI / PMHx or noted below:  Review of Systems  Constitutional: Negative.   HENT: Negative.   Eyes: Negative.   Respiratory: Negative.   Cardiovascular: Negative.   Gastrointestinal: Negative.   Genitourinary: Negative.   Musculoskeletal: Negative.   Skin: Negative.   Neurological: Negative.   Endo/Heme/Allergies: Negative.   Psychiatric/Behavioral: Negative.      Objective:   Vitals:   01/28/20 1129  BP: (!) 154/76  Pulse: 83  Resp: 18  Temp: 97.8 F (36.6 C)  SpO2: 97%          Physical Exam Constitutional:      Appearance: He is not diaphoretic.  HENT:     Head:  Normocephalic.     Right Ear: Tympanic membrane, ear canal and external ear normal.     Left Ear: Tympanic membrane, ear canal and external ear normal.     Nose: Nose normal. No mucosal edema (Septal perforation) or rhinorrhea.     Mouth/Throat:     Pharynx: Uvula midline. No oropharyngeal exudate.  Eyes:     Conjunctiva/sclera: Conjunctivae normal.  Neck:     Thyroid: No thyromegaly.     Trachea: Trachea normal. No tracheal tenderness or tracheal deviation.  Cardiovascular:     Rate and Rhythm: Normal rate and regular rhythm.     Heart sounds: S1 normal and S2 normal. Murmur (Systolic)  heard.   Pulmonary:     Effort: No respiratory distress.     Breath sounds: Normal breath sounds. No stridor. No wheezing or rales.  Lymphadenopathy:     Head:     Right side of head: No tonsillar adenopathy.     Left side of head: No tonsillar adenopathy.     Cervical: No cervical adenopathy.  Skin:    Findings: No erythema or rash.     Nails: There is no clubbing.  Neurological:     Mental Status: He is alert.     Diagnostics:    Spirometry was performed and demonstrated an FEV1 of 2.31 at 93 % of predicted.  The patient had an Asthma Control Test with the following results: ACT Total Score: 22.    Assessment and Plan:   1. Asthma, severe persistent, well-controlled   2. Other eosinophilia   3. Chronic pansinusitis   4. Perennial allergic rhinitis   5. 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 Hypogammaglobulinemia ([place])   7. Immunosuppression ([place])   8. LPRD (laryngopharyngeal reflux disease)     1.  Treat and prevent inflammation:   A. Symbicort 160 - 2 inhalations 2 times per day    B. Montelukast 10 mg - 1 tablet 1 time per day  D. Benralizumab injections    2. Treat and prevent reflux:   A. Minimize caffeine consumption   B. Nexium 40 mg to 2 times per day  3. If needed:   A. albuterol HFA - 2 inhalations every 4-6 hours  B. OTC  Antihistamine  4. Obtain Pneumovax and DT vaccine as a test to see if you respond to vaccines.  5. Check Pneumo 23 serotiters and DT titers and IgA/G/M and Covid 19 IgG antibodies 6 weeks after immunizations.  6. Immunoglobulin replacement?   7. Return to clinic in 12 weeks or earlier if problem  Gene has really had a very good response to his benralizumab injections and his airway has cleared up significantly in the face of this therapy.  He will continue on Symbicort montelukast and benralizumab and will also continue to use Nexium twice a day which has helped his reflux significantly.  We need to determine whether or not he can respond to immunogens.  He needs to obtain the Pneumovax and a DT vaccine to see if he can generate an antibody response against diphtheria which currently is at 0, and against various serotypes of pneumococcus which at this point only has 50% protection.  As well, when he has his blood checked we will check for IgG antibodies directed against COVID-19 to see if he was able to mount an antibody response against his Covid vaccines  I will see him back in his clinic in 12 weeks or earlier if there is a problem.   Allena Katz, MD Allergy / Immunology [place]

## 2020-01-28 NOTE — Patient Instructions (Addendum)
  1.  Treat and prevent inflammation:   A. Symbicort 160 - 2 inhalations 2 times per day    B. Montelukast 10 mg - 1 tablet 1 time per day  D. Benralizumab injections    2. Treat and prevent reflux:   A. Minimize caffeine consumption   B. Nexium 40 mg to 2 times per day  3. If needed:   A. albuterol HFA - 2 inhalations every 4-6 hours  B. OTC Antihistamine  4. Obtain Pneumovax and DT vaccine as a test to see if you respond to vaccines.  5. Check Pneumo 23 serotiters and DT titers and IgA/G/M and Covid 19 IgG antibodies 6 weeks after immunizations.  6. Immunoglobulin replacement?   7. Return to clinic in 12 weeks or earlier if problem

## 2020-01-29 ENCOUNTER — Encounter: Payer: Self-pay | Admitting: Allergy and Immunology

## 2020-01-29 DIAGNOSIS — J4550 Severe persistent asthma, uncomplicated: Secondary | ICD-10-CM | POA: Diagnosis not present

## 2020-01-30 ENCOUNTER — Ambulatory Visit (INDEPENDENT_AMBULATORY_CARE_PROVIDER_SITE_OTHER): Payer: Medicare Other

## 2020-01-30 ENCOUNTER — Other Ambulatory Visit: Payer: Self-pay

## 2020-01-30 DIAGNOSIS — J4550 Severe persistent asthma, uncomplicated: Secondary | ICD-10-CM | POA: Diagnosis not present

## 2020-02-04 DIAGNOSIS — E032 Hypothyroidism due to medicaments and other exogenous substances: Secondary | ICD-10-CM | POA: Diagnosis not present

## 2020-02-04 DIAGNOSIS — T462X5D Adverse effect of other antidysrhythmic drugs, subsequent encounter: Secondary | ICD-10-CM | POA: Diagnosis not present

## 2020-02-05 DIAGNOSIS — E039 Hypothyroidism, unspecified: Secondary | ICD-10-CM | POA: Diagnosis not present

## 2020-02-10 ENCOUNTER — Other Ambulatory Visit: Payer: Self-pay | Admitting: Adult Health

## 2020-02-10 ENCOUNTER — Other Ambulatory Visit (HOSPITAL_COMMUNITY): Payer: Self-pay | Admitting: *Deleted

## 2020-02-10 ENCOUNTER — Other Ambulatory Visit: Payer: Self-pay | Admitting: Internal Medicine

## 2020-02-10 MED ORDER — DILTIAZEM HCL ER COATED BEADS 180 MG PO CP24
180.0000 mg | ORAL_CAPSULE | Freq: Two times a day (BID) | ORAL | 2 refills | Status: DC
Start: 2020-02-10 — End: 2020-04-27

## 2020-02-10 NOTE — Telephone Encounter (Signed)
Prescription refill request for Eliquis received. Indication:Afib Last office visit:[place], 12/30/2019 Scr: 1.36, 09/16/2019 Age: 86 yo Weight: 68.7 kg   Prescription refill sent.

## 2020-03-02 ENCOUNTER — Encounter (INDEPENDENT_AMBULATORY_CARE_PROVIDER_SITE_OTHER): Payer: Medicare Other | Admitting: Ophthalmology

## 2020-03-02 DIAGNOSIS — J3489 Other specified disorders of nose and nasal sinuses: Secondary | ICD-10-CM | POA: Diagnosis not present

## 2020-03-02 DIAGNOSIS — Z1152 Encounter for screening for COVID-19: Secondary | ICD-10-CM | POA: Diagnosis not present

## 2020-03-02 DIAGNOSIS — R059 Cough, unspecified: Secondary | ICD-10-CM | POA: Diagnosis not present

## 2020-03-06 ENCOUNTER — Encounter: Payer: Self-pay | Admitting: Pulmonary Disease

## 2020-03-06 ENCOUNTER — Other Ambulatory Visit: Payer: Self-pay

## 2020-03-06 ENCOUNTER — Ambulatory Visit (INDEPENDENT_AMBULATORY_CARE_PROVIDER_SITE_OTHER): Payer: Medicare Other | Admitting: Pulmonary Disease

## 2020-03-06 ENCOUNTER — Ambulatory Visit (INDEPENDENT_AMBULATORY_CARE_PROVIDER_SITE_OTHER): Payer: Medicare Other

## 2020-03-06 VITALS — BP 118/70 | HR 78 | Temp 97.4°F | Ht 69.0 in | Wt 153.6 lb

## 2020-03-06 DIAGNOSIS — J4550 Severe persistent asthma, uncomplicated: Secondary | ICD-10-CM

## 2020-03-06 DIAGNOSIS — Z0000 Encounter for general adult medical examination without abnormal findings: Secondary | ICD-10-CM | POA: Diagnosis not present

## 2020-03-06 DIAGNOSIS — J310 Chronic rhinitis: Secondary | ICD-10-CM

## 2020-03-06 DIAGNOSIS — J449 Chronic obstructive pulmonary disease, unspecified: Secondary | ICD-10-CM | POA: Diagnosis not present

## 2020-03-06 DIAGNOSIS — R059 Cough, unspecified: Secondary | ICD-10-CM | POA: Diagnosis not present

## 2020-03-06 DIAGNOSIS — J45909 Unspecified asthma, uncomplicated: Secondary | ICD-10-CM | POA: Diagnosis not present

## 2020-03-06 MED ORDER — CHLORPHENIRAMINE MALEATE 4 MG PO TABS: ORAL_TABLET | ORAL | 2 refills | Status: DC

## 2020-03-06 MED ORDER — PREDNISONE 10 MG PO TABS: ORAL_TABLET | ORAL | 0 refills | Status: DC

## 2020-03-06 NOTE — Progress Notes (Signed)
@Patient  ID: Chad Morrison, male    DOB: [birth year], 87 y.o.   MRN: [id number]  No chief complaint on file.   Referring provider: Tisovec, Fransico Him, MD  HPI:  87 year old male former smoker followed in our office for severe persistent chronic asthma  PMH: A. fib, Wegner syndrome Smoker/ Smoking History: Former smoker.  Quit 1963.  2-pack-year smoking history Maintenance: Symbicort 160 Pt of: Dr. Vaughan Browner  03/06/2020  - Visit   87 year old male former smoker followed in our office for severe persistent chronic asthma.  Established with Dr. Vaughan Browner.  Last seen in October/2021.  At that time patient had recently started Nucala.  He was maintained on Symbicort.  He was encouraged to continue to use Astelin and budesonide nasal washes.  Patient reporting that he stopped taking budesonide nasal saline rinses because he did not like how these felt.  He is now on Fasenra managed by Dr. Neldon Mc his allergist.  He feels the Berna Bue has been helping.  He remains adherent to Symbicort 160.  Next follow-up with allergy asthma is in February/2022 next Patient reports that he has had 2 weeks of nasal congestion, headache, sinus pressure.  He denies fevers.  He was seen by primary care and treated with 10 days of Levaquin.  He was checked for the flu as well as COVID-19 and found to be negative of both.  Wife has similar symptoms she was also negative for flu and Covid.  Patient continues to have ongoing cough, congestion as well as fatigue.  Patient is using over-the-counter cough medicine he is able to bring up thick yellow mucus.   Questionaires / Pulmonary Flowsheets:   ACT:  Asthma Control Test ACT Total Score  01/28/2020 22  12/05/2019 19  11/26/2019 25    MMRC: No flowsheet data found.  Epworth:  No flowsheet data found.  Tests:   Imaging: High-resolution CT 12/22/2017-mild bibasal groundglass with mild reticulation.  Pulmonary nodules. CT chest 05/06/2019-stable pulmonary nodules since  2019.  Scattered linear atelectasis and mild bronchiectasis I have reviewed the images personally.  PFTs: 05/24/2017 FVC 3.78 [96%], FEV1 2.44 [88%], F/F 65, TLC 7.41 [104%], DLCO 22.81 [90%] Moderate obstruction with air trapping and mild diffusion impairment  ACT score 12/07/2019- 19  Labs: CBC 10/22/2019-WBC 5.7, eos 14%, absolute eosinophil count 798   FENO:  No results found for: NITRICOXIDE  PFT: PFT Results Latest Ref Rng & Units 05/24/2017  FVC-Pre L 3.40  FVC-Predicted Pre % 87  FVC-Post L 3.78  FVC-Predicted Post % 96  Pre FEV1/FVC % % 65  Post FEV1/FCV % % 65  FEV1-Pre L 2.21  FEV1-Predicted Pre % 80  FEV1-Post L 2.44  DLCO uncorrected ml/min/mmHg 22.81  DLCO UNC% % 70  DLVA Predicted % 66  TLC L 7.41  TLC % Predicted % 104  RV % Predicted % 78    WALK:  SIX MIN WALK 11/08/2017 10/18/2017  Supplimental Oxygen during Test? (L/min) - No  Tech Comments: Patient was able to maintain the same steady pace and did not have to stop at any time during the walk. Pt walked at a normal pace completing all required laps. pt had complaints of moderate SOB.    Imaging: No results found.  Lab Results:  CBC    Component Value Date/Time   WBC 5.7 10/22/2019 1109   WBC 7.0 09/16/2019 1120   RBC 4.33 10/22/2019 1109   RBC 3.94 (L) 09/16/2019 1120   HGB 13.7 10/22/2019 1109   HCT 38.7  10/22/2019 1109   PLT 204 10/22/2019 1109   MCV 89 10/22/2019 1109   MCH 31.6 10/22/2019 1109   MCH 28.7 09/16/2019 1120   MCHC 35.4 10/22/2019 1109   MCHC 32.4 09/16/2019 1120   RDW 14.8 10/22/2019 1109   LYMPHSABS 0.9 10/22/2019 1109   MONOABS 0.6 01/18/2018 2142   EOSABS 0.8 (H) 10/22/2019 1109   BASOSABS 0.1 10/22/2019 1109    BMET    Component Value Date/Time   NA 139 09/16/2019 1120   K 3.7 09/16/2019 1120   CL 105 09/16/2019 1120   CO2 24 09/16/2019 1120   GLUCOSE 120 (H) 09/16/2019 1120   BUN 18 09/16/2019 1120   CREATININE 1.36 (H) 09/16/2019 1120   CALCIUM 9.4  09/16/2019 1120   GFRNONAA 47 (L) 09/16/2019 1120   GFRAA 55 (L) 09/16/2019 1120    BNP No results found for: BNP  ProBNP No results found for: PROBNP  Specialty Problems      Pulmonary Problems   Chronic rhinitis   COPD (chronic obstructive pulmonary disease) (HCC)   Shortness of breath   Epistaxis   Asthma   Pulmonary nodules      Allergies  Allergen Reactions  . Penicillins Hives    Has patient had a PCN reaction causing immediate rash, facial/tongue/throat swelling, SOB or lightheadedness with hypotension: No Has patient had a PCN reaction causing severe rash involving mucus membranes or skin necrosis: No Has patient had a PCN reaction that required hospitalization: No Has patient had a PCN reaction occurring within the last 10 years: Yes If all of the above answers are "NO", then may proceed with Cephalosporin use.   . Sulfa Antibiotics Other (See Comments)    Unknown to patient   . Latex Hives and Rash    Immunization History  Administered Date(s) Administered  . Influenza, High Dose Seasonal PF 12/27/2016, 12/18/2017, 11/29/2018, 12/30/2019  . Influenza-Unspecified 12/27/2016, 12/18/2017, 11/29/2018  . PFIZER SARS-COV-2 Vaccination 03/16/2019, 04/04/2019, 10/25/2019  . Pneumococcal Conjugate-13 01/04/2016  . Pneumococcal Polysaccharide-23 01/03/2014  . Pneumococcal-Unspecified 01/04/2016, 10/31/2016    Past Medical History:  Diagnosis Date  . Anxiety   . Asthma   . Asthmatic bronchitis   . Atrial flutter (HCC)    chads2vasc score is 3  . Ehrlichiosis [id number]  . GERD (gastroesophageal reflux disease)   . Granulomatosis with polyangiitis ([place])   . HTN (hypertension)   . Hyperlipidemia   . Paroxysmal atrial fibrillation (HCC)   . Prostate cancer ([place])   . Seasonal allergies     Tobacco History: Social History   Tobacco Use  Smoking Status Former Smoker  . Packs/day: 0.50  . Years: 4.00  . Pack years: 2.00  . Types: Cigarettes  . Quit date:  02/28/1961  . Years since quitting: 59.0  Smokeless Tobacco Never Used   Counseling given: Not Answered   Continue to not smoke  Outpatient Encounter Medications as of 03/06/2020  Medication Sig  . albuterol (VENTOLIN HFA) 108 (90 Base) MCG/ACT inhaler USE 1 TO 2 INHALATIONS     ORALLY EVERY 6 HOURS AS    NEEDED FOR WHEEZING OR     SHORTNESS OF BREATH  . ALPRAZolam (XANAX) 0.25 MG tablet Take 0.25 mg by mouth at bedtime as needed for sleep.   . Ascorbic Acid (VITAMIN C PO) Take by mouth. Not sure the dosage  . azelastine (ASTELIN) 0.1 % nasal spray INSTILL 1 SPRAY INTO EACH NOSTRIL TWICE DAILY AS DIRECTED  . Bromfenac Sodium (PROLENSA) 0.07 %  SOLN Apply 1 drop to eye daily.  . budesonide-formoterol (SYMBICORT) 160-4.5 MCG/ACT inhaler Inhale 2 puffs into the lungs 2 (two) times daily.  . Calcium Carbonate-Vitamin D (CALCIUM-VITAMIN D) 600-125 MG-UNIT TABS Take 1 tablet by mouth 2 (two) times daily.   . chlorpheniramine (CHLOR-TRIMETON) 4 MG tablet Please start taking chlorpheniramine (aka Chlor tabs) 4 mg tablet (1 to 2 tablets at night) for management of allergies and postnasal drip at night. This medication is sedating  . diltiazem (CARDIZEM CD) 180 MG 24 hr capsule Take 1 capsule (180 mg total) by mouth in the morning and at bedtime.  . diphenhydrAMINE (BENADRYL) 25 MG tablet Take 50 mg by mouth at bedtime as needed.   Marland Kitchen ELIQUIS 5 MG TABS tablet TAKE 1 TABLET TWICE A DAY  . EPINEPHrine 0.3 mg/0.3 mL IJ SOAJ injection SMARTSIG:0.3 Milligram(s) IM Once  . esomeprazole (NEXIUM) 40 MG capsule Take 1 capsule (40 mg total) by mouth 2 (two) times daily before a meal.  . Ferrous Sulfate (SLOW FE PO) Take by mouth.  . folic acid (FOLVITE) 1 MG tablet Take 2 mg by mouth daily.   Marland Kitchen levocetirizine (XYZAL) 5 MG tablet Take 1 tablet (5 mg total) by mouth every evening.  Marland Kitchen levothyroxine (SYNTHROID, LEVOTHROID) 75 MCG tablet Take 75 mcg by mouth daily before breakfast.  . methotrexate 2.5 MG tablet 6 tabs  weekly  . montelukast (SINGULAIR) 10 MG tablet TAKE 1 TABLET AT BEDTIME  . Multiple Vitamin (MULTIVITAMIN WITH MINERALS) TABS tablet Take 1 tablet daily by mouth.  . Multiple Vitamins-Minerals (OCUVITE PRESERVISION PO) Take 1 tablet 2 (two) times daily by mouth.  . predniSONE (DELTASONE) 10 MG tablet 4 tabs for 2 days, then 3 tabs for 2 days, 2 tabs for 2 days, then 1 tab for 2 days, then stop  . rosuvastatin (CRESTOR) 5 MG tablet Take 1 tablet (5 mg total) by mouth daily.  . SYMBICORT 160-4.5 MCG/ACT inhaler USE 2 INHALATIONS ORALLY   EVERY MORNING AND AT       BEDTIME   Facility-Administered Encounter Medications as of 03/06/2020  Medication  . Benralizumab SOSY 30 mg     Review of Systems  Review of Systems  Constitutional: Positive for fatigue. Negative for activity change, chills, fever and unexpected weight change.  HENT: Positive for congestion, sinus pressure and sinus pain. Negative for postnasal drip, rhinorrhea and sore throat.   Eyes: Negative.   Respiratory: Positive for cough. Negative for shortness of breath and wheezing.   Cardiovascular: Negative for chest pain and palpitations.  Gastrointestinal: Negative for constipation, diarrhea, nausea and vomiting.  Endocrine: Negative.   Genitourinary: Negative.   Musculoskeletal: Negative.   Skin: Negative.   Neurological: Negative for dizziness and headaches.  Psychiatric/Behavioral: Negative.  Negative for dysphoric mood. The patient is not nervous/anxious.   All other systems reviewed and are negative.    Physical Exam  BP 118/70 (BP Location: Left Arm, Cuff Size: Normal)   Pulse 78   Temp (!) 97.4 F (36.3 C) (Oral)   Ht 5\' 9"  (1.753 m)   Wt 153 lb 9.6 oz (69.7 kg)   SpO2 97%   BMI 22.68 kg/m   Wt Readings from Last 5 Encounters:  03/06/20 153 lb 9.6 oz (69.7 kg)  12/30/19 151 lb 6.4 oz (68.7 kg)  12/05/19 149 lb 6.4 oz (67.8 kg)  10/22/19 148 lb (67.1 kg)  09/30/19 146 lb (66.2 kg)    BMI Readings from  Last 5 Encounters:  03/06/20 22.68  kg/m  12/30/19 22.36 kg/m  12/05/19 22.06 kg/m  11/26/19 21.86 kg/m  10/22/19 21.86 kg/m     Physical Exam Vitals and nursing note reviewed.  Constitutional:      General: He is not in acute distress.    Appearance: Normal appearance. He is normal weight.  HENT:     Head: Normocephalic and atraumatic.     Right Ear: Hearing and external ear normal.     Left Ear: Hearing and external ear normal.     Nose: Rhinorrhea present. No mucosal edema.     Right Turbinates: Not enlarged.     Left Turbinates: Not enlarged.     Mouth/Throat:     Mouth: Mucous membranes are dry.     Pharynx: Oropharynx is clear. No oropharyngeal exudate.     Comments: +PND Eyes:     Pupils: Pupils are equal, round, and reactive to light.  Cardiovascular:     Rate and Rhythm: Normal rate and regular rhythm.     Pulses: Normal pulses.     Heart sounds: Normal heart sounds. No murmur heard.   Pulmonary:     Effort: Pulmonary effort is normal.     Breath sounds: Wheezing (exp wheeze) and rhonchi (Expiratory rhonchi) present. No decreased breath sounds or rales.  Musculoskeletal:     Cervical back: Normal range of motion.     Right lower leg: No edema.     Left lower leg: No edema.  Lymphadenopathy:     Cervical: No cervical adenopathy.  Skin:    General: Skin is warm and dry.     Capillary Refill: Capillary refill takes less than 2 seconds.     Findings: No erythema or rash.  Neurological:     General: No focal deficit present.     Mental Status: He is alert and oriented to person, place, and time.     Motor: No weakness.     Coordination: Coordination normal.     Gait: Gait is intact. Gait normal.  Psychiatric:        Mood and Affect: Mood normal.        Behavior: Behavior normal. Behavior is cooperative.        Thought Content: Thought content normal.        Judgment: Judgment normal.       Assessment & Plan:   Discussion: Do not currently  believe right now patient is dealing with a bacterial infection.  Patient status post 10 days of Levaquin from primary care.  Will obtain chest x-ray today.  Explained to patient as well as spouse over the phone that likely would not consider additional antibiotics at this point time as patient is afebrile and is not having thick purulent mucus.  He is having a significant nasal drainage which has been a chronic issue.  Patient with acute on chronic flare.  We will have patient start using chlor tabs at night as he has found Benadryl helpful.  This will likely be a short-term management option for the patient to go to when he is having flares.  Due to significant anticholinergic side effects of long-term Chlortab use.  Will defer to allergy asthma.  Encourage patient to keep follow-up with allergy asthma in February/2022  Chronic rhinitis Plan: Continue Astelin nasal spray Continue Fasenra Continue Singulair Prednisone taper today Continue follow-up with allergy asthma Continue nasal saline rinses Start chlor tabs as needed at night  Asthma Plan: Prednisone taper today Chest x-ray today Continue Fasenra Continue Symbicort Continue  Singulair  Healthcare maintenance Plan: Keep follow-up with allergy asthma Continue follow-up with primary care     Return in about 4 weeks (around 04/03/2020), or if symptoms worsen or fail to improve, for Follow up with Dr. Vaughan Browner.   Lauraine Rinne, NP 03/06/2020   This appointment required 44 minutes of patient care (this includes precharting, chart review, review of results, face-to-face care, etc.).

## 2020-03-06 NOTE — Assessment & Plan Note (Addendum)
Plan: Keep follow-up with allergy asthma Continue follow-up with primary care

## 2020-03-06 NOTE — Patient Instructions (Addendum)
You were seen today by Lauraine Rinne, NP  for:   1. Severe persistent asthma without complication  - predniSONE (DELTASONE) 10 MG tablet; 4 tabs for 2 days, then 3 tabs for 2 days, 2 tabs for 2 days, then 1 tab for 2 days, then stop  Dispense: 20 tablet; Refill: 0 - DG Chest 2 View; Future  Prednisone 10mg  tablet  >>>4 tabs for 2 days, then 3 tabs for 2 days, 2 tabs for 2 days, then 1 tab for 2 days, then stop >>>take with food  >>>take in the morning   Chest x-ray today  Continue Symbicort 160 >>> 2 puffs in the morning right when you wake up, rinse out your mouth after use, 12 hours later 2 puffs, rinse after use >>> Take this daily, no matter what >>> This is not a rescue inhaler   Only use your albuterol as a rescue medication to be used if you can't catch your breath by resting or doing a relaxed purse lip breathing pattern.  - The less you use it, the better it will work when you need it. - Ok to use up to 2 puffs  every 4 hours if you must but call for immediate appointment if use goes up over your usual need - Don't leave home without it !!  (think of it like the spare tire for your car)   Continue Fasenra  2. Chronic rhinitis  - predniSONE (DELTASONE) 10 MG tablet; 4 tabs for 2 days, then 3 tabs for 2 days, 2 tabs for 2 days, then 1 tab for 2 days, then stop  Dispense: 20 tablet; Refill: 0 - chlorpheniramine (CHLOR-TRIMETON) 4 MG tablet; Please start taking chlorpheniramine (aka Chlor tabs) 4 mg tablet (1 to 2 tablets at night) for management of allergies and postnasal drip at night. This medication is sedating  Dispense: 30 tablet; Refill: 2  Continue nasal saline rinses  Continue Astelin nasal spray  Continue daily Xyzal  Continue daily Singulair  Please start taking chlorpheniramine (aka Chlor tabs) 4 mg tablet (1 to 2 tablets at night) for management of allergies and postnasal drip at night >>> This is an over-the-counter medication >>> This medication is  sedating  3. Healthcare maintenance  Keep follow-up with primary care  Keep follow-up with allergy asthma  As outlined today if symptoms continue to persist may need to consider follow-up with ENT Dr. Constance Holster   We recommend today:  Orders Placed This Encounter  Procedures  . DG Chest 2 View    Standing Status:   Future    Number of Occurrences:   1    Standing Expiration Date:   07/04/2020    Order Specific Question:   Reason for Exam (SYMPTOM  OR DIAGNOSIS REQUIRED)    Answer:   cough    Order Specific Question:   Preferred imaging location?    Answer:   Internal    Order Specific Question:   Radiology Contrast Protocol - do NOT remove file path    Answer:   [email or web address]   Orders Placed This Encounter  Procedures  . DG Chest 2 View   Meds ordered this encounter  Medications  . predniSONE (DELTASONE) 10 MG tablet    Sig: 4 tabs for 2 days, then 3 tabs for 2 days, 2 tabs for 2 days, then 1 tab for 2 days, then stop    Dispense:  20 tablet    Refill:  0  . chlorpheniramine (CHLOR-TRIMETON) 4 MG tablet  Sig: Please start taking chlorpheniramine (aka Chlor tabs) 4 mg tablet (1 to 2 tablets at night) for management of allergies and postnasal drip at night. This medication is sedating    Dispense:  30 tablet    Refill:  2    Follow Up:    Return in about 4 weeks (around 04/03/2020), or if symptoms worsen or fail to improve, for Follow up with Dr. Vaughan Browner.   Notification of test results are managed in the following manner: If there are  any recommendations or changes to the  plan of care discussed in office today,  we will contact you and let you know what they are. If you do not hear from [place], then your results are normal and you can view them through your  MyChart account , or a letter will be sent to you. Thank you again for trusting [place] with your care  - Thank you, [facility]    It is flu season:   >>> Best  ways to protect herself from the flu: Receive the yearly flu vaccine, practice good hand hygiene washing with soap and also using hand sanitizer when available, eat a nutritious meals, get adequate rest, hydrate appropriately       Please contact the office if your symptoms worsen or you have concerns that you are not improving.   Thank you for choosing [facility] for your healthcare, and for allowing [place] to partner with you on your healthcare journey. I am thankful to be able to provide care to you today.   Wyn Quaker FNP-C

## 2020-03-06 NOTE — Assessment & Plan Note (Signed)
Plan: Prednisone taper today Chest x-ray today Continue Fasenra Continue Symbicort Continue Singulair

## 2020-03-06 NOTE — Assessment & Plan Note (Addendum)
Plan: Continue Astelin nasal spray Continue Fasenra Continue Singulair Prednisone taper today Continue follow-up with allergy asthma Continue nasal saline rinses Start chlor tabs as needed at night

## 2020-03-12 ENCOUNTER — Other Ambulatory Visit: Payer: Self-pay

## 2020-03-12 ENCOUNTER — Encounter (INDEPENDENT_AMBULATORY_CARE_PROVIDER_SITE_OTHER): Payer: Self-pay | Admitting: Ophthalmology

## 2020-03-12 ENCOUNTER — Ambulatory Visit (INDEPENDENT_AMBULATORY_CARE_PROVIDER_SITE_OTHER): Payer: Medicare Other | Admitting: Ophthalmology

## 2020-03-12 DIAGNOSIS — H35351 Cystoid macular degeneration, right eye: Secondary | ICD-10-CM | POA: Diagnosis not present

## 2020-03-12 DIAGNOSIS — H353211 Exudative age-related macular degeneration, right eye, with active choroidal neovascularization: Secondary | ICD-10-CM

## 2020-03-12 DIAGNOSIS — H35371 Puckering of macula, right eye: Secondary | ICD-10-CM | POA: Diagnosis not present

## 2020-03-12 DIAGNOSIS — H4301 Vitreous prolapse, right eye: Secondary | ICD-10-CM

## 2020-03-12 MED ORDER — RANIBIZUMAB 0.5 MG/0.05ML IZ SOSY
0.5000 mg | PREFILLED_SYRINGE | INTRAVITREAL | Status: AC | PRN
  Administered 2020-03-12: .5 mg via INTRAVITREAL

## 2020-03-12 NOTE — Assessment & Plan Note (Signed)
OD much improved with much less intraretinal fluid.  Outer foveal drusenoid deposit no change in time.

## 2020-03-12 NOTE — Assessment & Plan Note (Signed)
Moderate topographic thickening remains, no intraretinal CME today

## 2020-03-12 NOTE — Progress Notes (Signed)
03/12/2020     CHIEF COMPLAINT Patient presents for Retina Follow Up (6 WK FU OD, POSS LUCENTIS OD///Pt report stable vision OD, denies F/F OD, denies pain or pressure OD. ///)   HISTORY OF PRESENT ILLNESS: Chad Morrison is a 87 y.o. male who presents to the clinic today for:   HPI    Retina Follow Up    In right eye.  This started 6 weeks ago.  Duration of 6 weeks.  Since onset it is stable. Additional comments: 6 WK FU OD, POSS LUCENTIS OD   Pt report stable vision OD, denies F/F OD, denies pain or pressure OD.           Last edited by Nichola Sizer D on 03/12/2020 10:31 AM. (History)      Referring physician: Haywood Pao, MD [place],  [place] [id number]  HISTORICAL INFORMATION:   Selected notes from the [facility]: Current Outpatient Medications (Ophthalmic Drugs)  Medication Sig  . Bromfenac Sodium (PROLENSA) 0.07 % SOLN Apply 1 drop to eye daily.   No current facility-administered medications for this visit. (Ophthalmic Drugs)   Current Outpatient Medications (Other)  Medication Sig  . albuterol (VENTOLIN HFA) 108 (90 Base) MCG/ACT inhaler USE 1 TO 2 INHALATIONS     ORALLY EVERY 6 HOURS AS    NEEDED FOR WHEEZING OR     SHORTNESS OF BREATH  . ALPRAZolam (XANAX) 0.25 MG tablet Take 0.25 mg by mouth at bedtime as needed for sleep.   . Ascorbic Acid (VITAMIN C PO) Take by mouth. Not sure the dosage  . azelastine (ASTELIN) 0.1 % nasal spray INSTILL 1 SPRAY INTO EACH NOSTRIL TWICE DAILY AS DIRECTED  . budesonide-formoterol (SYMBICORT) 160-4.5 MCG/ACT inhaler Inhale 2 puffs into the lungs 2 (two) times daily.  . Calcium Carbonate-Vitamin D (CALCIUM-VITAMIN D) 600-125 MG-UNIT TABS Take 1 tablet by mouth 2 (two) times daily.   . chlorpheniramine (CHLOR-TRIMETON) 4 MG tablet Please start taking chlorpheniramine (aka Chlor tabs) 4 mg tablet (1 to 2 tablets at night) for management of allergies and postnasal drip at  night. This medication is sedating  . diltiazem (CARDIZEM CD) 180 MG 24 hr capsule Take 1 capsule (180 mg total) by mouth in the morning and at bedtime.  . diphenhydrAMINE (BENADRYL) 25 MG tablet Take 50 mg by mouth at bedtime as needed.   [place] Kitchen ELIQUIS 5 MG TABS tablet TAKE 1 TABLET TWICE A DAY  . EPINEPHrine 0.3 mg/0.3 mL IJ SOAJ injection SMARTSIG:0.3 Milligram(s) IM Once  . esomeprazole (NEXIUM) 40 MG capsule Take 1 capsule (40 mg total) by mouth 2 (two) times daily before a meal.  . Ferrous Sulfate (SLOW FE PO) Take by mouth.  . folic acid (FOLVITE) 1 MG tablet Take 2 mg by mouth daily.   Mar[place] Kitchen levocetirizine (XYZAL) 5 MG tablet Take 1 tablet (5 mg total) by mouth every evening.  Mar[place] Kitchen levothyroxine (SYNTHROID, LEVOTHROID) 75 MCG tablet Take 75 mcg by mouth daily before breakfast.  . methotrexate 2.5 MG tablet 6 tabs weekly  . montelukast (SINGULAIR) 10 MG tablet TAKE 1 TABLET AT BEDTIME  . Multiple Vitamin (MULTIVITAMIN WITH MINERALS) TABS tablet Take 1 tablet daily by mouth.  . Multiple Vitamins-Minerals (OCUVITE PRESERVISION PO) Take 1 tablet 2 (two) times daily by mouth.  . predniSONE (DELTASONE) 10 MG tablet 4 tabs for 2 days, then 3 tabs for 2 days, 2 tabs for 2 days, then 1 tab  for 2 days, then stop  . rosuvastatin (CRESTOR) 5 MG tablet Take 1 tablet (5 mg total) by mouth daily.  . SYMBICORT 160-4.5 MCG/ACT inhaler USE 2 INHALATIONS ORALLY   EVERY MORNING AND AT       BEDTIME   Current Facility-Administered Medications (Other)  Medication Route  . Benralizumab SOSY 30 mg Subcutaneous      REVIEW OF SYSTEMS:    ALLERGIES Allergies  Allergen Reactions  . Penicillins Hives    Has patient had a PCN reaction causing immediate rash, facial/tongue/throat swelling, SOB or lightheadedness with hypotension: No Has patient had a PCN reaction causing severe rash involving mucus membranes or skin necrosis: No Has patient had a PCN reaction that required hospitalization: No Has patient had a  PCN reaction occurring within the last 10 years: Yes If all of the above answers are "NO", then may proceed with Cephalosporin use.   . Sulfa Antibiotics Other (See Comments)    Unknown to patient   . Latex Hives and Rash    PAST MEDICAL HISTORY Past Medical History:  Diagnosis Date  . Anxiety   . Asthma   . Asthmatic bronchitis   . Atrial flutter (HCC)    chads2vasc score is 3  . Ehrlichiosis 2008  . GERD (gastroesophageal reflux disease)   . Granulomatosis with polyangiitis (HCC)   . HTN (hypertension)   . Hyperlipidemia   . Paroxysmal atrial fibrillation (HCC)   . Prostate cancer (HCC)   . Seasonal allergies    Past Surgical History:  Procedure Laterality Date  . BASAL CELL CARCINOMA EXCISION  2013   EYELID  . HERNIA REPAIR Bilateral 2000, 2002  . KNEE ARTHROSCOPY  2000  . prostectomy  1995   RADICAL  . TONSILLECTOMY  1940    FAMILY HISTORY Family History  Problem Relation Age of Onset  . Asthma Mother 56  . Allergies Mother   . Ulcers Mother   . CVA Father 73  . Heart Problems Father   . Other Father        NEUROLOGIC ISSUES   . Other Brother 11       BICYCLE ACCIDENT  . Migraines Son   . Diabetes Mellitus I Son     SOCIAL HISTORY Social History   Tobacco Use  . Smoking status: Former Smoker    Packs/day: 0.50    Years: 4.00    Pack years: 2.00    Types: Cigarettes    Quit date: 02/28/1961    Years since quitting: 59.0  . Smokeless tobacco: Never Used  Vaping Use  . Vaping Use: Never used  Substance Use Topics  . Alcohol use: Yes    Alcohol/week: 2.0 - 3.0 standard drinks    Types: 2 - 3 Standard drinks or equivalent per week  . Drug use: No         OPHTHALMIC EXAM:  Base Eye Exam    Visual Acuity (ETDRS)      Right Left   Dist [place] 20/30 -2 20/25 -1   Dist ph [place] NI        Tonometry (Tonopen, 10:39 AM)      Right Left   Pressure 14 14       Pupils      Dark Light Shape React APD   Right 4 3 Irregular Slow None   Left 4 3  Round Slow None       Visual Fields (Counting fingers)      Left Right  Full Full       Extraocular Movement      Right Left    Full Full       Neuro/Psych    Oriented x3: Yes   Mood/Affect: Normal       Dilation    Right eye: 1.0% Mydriacyl, 2.5% Phenylephrine @ 10:39 AM        Slit Lamp and Fundus Exam    External Exam      Right Left   External Normal Normal       Slit Lamp Exam      Right Left   Lids/Lashes Normal Normal   Conjunctiva/Sclera White and quiet White and quiet   Cornea Clear Clear   Anterior Chamber Deep and quiet has not, no remaining vitreous strand to the cornea Deep and quiet   Iris Round and reactive Round and reactive   Lens Posterior chamber intraocular lens 3+ Nuclear sclerosis   Anterior Vitreous Normal Normal       Fundus Exam      Right Left   Posterior Vitreous Posterior vitreous detachment    Disc Normal    C/D Ratio 0.65    Macula Normal, subfoveal drusenoid, no obvious CME    Vessels Normal    Periphery Normal           IMAGING AND PROCEDURES  Imaging and Procedures for 03/12/20  OCT, Retina - OU - Both Eyes       Right Eye Quality was good. Scan locations included subfoveal. Central Foveal Thickness: 398. Progression has improved. Findings include epiretinal membrane, cystoid macular edema, retinal drusen .   Left Eye Quality was good. Scan locations included subfoveal. Central Foveal Thickness: 346. Progression has been stable. Findings include abnormal foveal contour, retinal drusen .   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  Nonetheless is also less subretinal fluid as a component of the wet ARMD.  We are treating that component now and yet we will extend interval examination next       Intravitreal Injection, Pharmacologic Agent - OD - Right Eye       Time Out 03/12/2020. 11:14 AM. Confirmed correct  patient, procedure, site, and patient consented.   Anesthesia Topical anesthesia was used. Anesthetic medications included [place] 3.5%.   Procedure Preparation included Ofloxacin , 10% betadine to eyelids, 5% betadine to ocular surface. A 30 gauge needle was used.   Injection:  0.5 mg Ranibizumab SOSY (LUCENTIS) 0.5mg /0.21ml prefilled syr   NDC: [id number], [id number]   Route: Intravitreal, Site: Right Eye, Waste: 0 mg  Post-op Post injection exam found visual acuity of at least counting fingers. The patient tolerated the procedure well. There were no complications. The patient received written and verbal post procedure care education. Post injection medications were not given.                 ASSESSMENT/PLAN:  Exudative age-related macular degeneration of right eye with active choroidal neovascularization (HCC) OD much improved with much less intraretinal fluid.  Outer foveal drusenoid deposit no change in time.  Cystoid macular edema of right eye CME has continued to improve, this is also after YAG vitreal lysis was successfully performed probably diminishing iris vitreous chafe and release of iris material  Vitreous prolapse of right eye Improved status post YAG vitreal lysis.  07/23/2019.  Right epiretinal membrane Moderate topographic thickening remains, no intraretinal CME today  ICD-10-CM   1. Exudative age-related macular degeneration of right eye with active choroidal neovascularization (HCC)  H35.3211 OCT, Retina - OU - Both Eyes    Intravitreal Injection, Pharmacologic Agent - OD - Right Eye    Ranibizumab SOSY 0.5 mg  2. Cystoid macular edema of right eye  H35.351   3. Vitreous prolapse of right eye  H43.01   4. Right epiretinal membrane  H35.371     1.  Repeat injection intravitreal Lucentis today and extend interval examination to 9-week  2.  3.  Ophthalmic Meds Ordered this visit:  Meds ordered this encounter  Medications  . Ranibizumab  SOSY 0.5 mg       Return in about 9 weeks (around 05/14/2020) for DILATE OU, LUCENTIS 0.5 OCT, OD.  There are no Patient Instructions on file for this visit.   Explained the diagnoses, plan, and follow up with the patient and they expressed understanding.  Patient expressed understanding of the importance of proper follow up care.   Clent Demark Mahi Zabriskie M.D. Diseases & Surgery of the Retina and Vitreous [facility] [place] 03/12/20     Abbreviations: M myopia (nearsighted); A astigmatism; H hyperopia (farsighted); P presbyopia; Mrx spectacle prescription;  CTL contact lenses; OD right eye; OS left eye; OU both eyes  XT exotropia; ET esotropia; PEK punctate epithelial keratitis; PEE punctate epithelial erosions; DES dry eye syndrome; MGD meibomian gland dysfunction; ATs artificial tears; PFAT's preservative free artificial tears; [place] nuclear sclerotic cataract; PSC posterior subcapsular cataract; ERM epi-retinal membrane; PVD posterior vitreous detachment; RD retinal detachment; DM diabetes mellitus; DR diabetic retinopathy; NPDR non-proliferative diabetic retinopathy; PDR proliferative diabetic retinopathy; CSME clinically significant macular edema; DME diabetic macular edema; dbh dot blot hemorrhages; CWS cotton wool spot; POAG primary open angle glaucoma; C/D cup-to-disc ratio; HVF humphrey visual field; GVF goldmann visual field; OCT optical coherence tomography; IOP intraocular pressure; BRVO Branch retinal vein occlusion; CRVO central retinal vein occlusion; CRAO central retinal artery occlusion; BRAO branch retinal artery occlusion; RT retinal tear; SB scleral buckle; PPV pars plana vitrectomy; VH Vitreous hemorrhage; PRP panretinal laser photocoagulation; IVK intravitreal kenalog; VMT vitreomacular traction; MH Macular hole;  NVD neovascularization of the disc; NVE neovascularization elsewhere; AREDS age related eye disease study; ARMD age related macular degeneration; POAG primary open  angle glaucoma; EBMD epithelial/anterior basement membrane dystrophy; ACIOL anterior chamber intraocular lens; IOL intraocular lens; PCIOL posterior chamber intraocular lens; Phaco/IOL phacoemulsification with intraocular lens placement; [place] photorefractive keratectomy; LASIK laser assisted in situ keratomileusis; HTN hypertension; DM diabetes mellitus; COPD chronic obstructive pulmonary disease

## 2020-03-12 NOTE — Assessment & Plan Note (Signed)
Improved status post YAG vitreal lysis.  07/23/2019.

## 2020-03-12 NOTE — Assessment & Plan Note (Signed)
CME has continued to improve, this is also after YAG vitreal lysis was successfully performed probably diminishing iris vitreous chafe and release of iris material

## 2020-03-25 DIAGNOSIS — J4550 Severe persistent asthma, uncomplicated: Secondary | ICD-10-CM | POA: Diagnosis not present

## 2020-03-26 ENCOUNTER — Ambulatory Visit (INDEPENDENT_AMBULATORY_CARE_PROVIDER_SITE_OTHER): Payer: Medicare Other | Admitting: *Deleted

## 2020-03-26 DIAGNOSIS — J4550 Severe persistent asthma, uncomplicated: Secondary | ICD-10-CM | POA: Diagnosis not present

## 2020-03-27 ENCOUNTER — Ambulatory Visit: Payer: Medicare Other | Admitting: Pulmonary Disease

## 2020-03-28 ENCOUNTER — Other Ambulatory Visit (INDEPENDENT_AMBULATORY_CARE_PROVIDER_SITE_OTHER): Payer: Self-pay | Admitting: Ophthalmology

## 2020-04-03 ENCOUNTER — Other Ambulatory Visit: Payer: Self-pay | Admitting: Pulmonary Disease

## 2020-04-21 ENCOUNTER — Encounter: Payer: Self-pay | Admitting: Allergy and Immunology

## 2020-04-21 ENCOUNTER — Other Ambulatory Visit: Payer: Self-pay

## 2020-04-21 ENCOUNTER — Ambulatory Visit (INDEPENDENT_AMBULATORY_CARE_PROVIDER_SITE_OTHER): Payer: Medicare Other | Admitting: Allergy and Immunology

## 2020-04-21 VITALS — BP 128/80 | HR 80 | Temp 97.7°F | Ht 69.0 in | Wt 155.0 lb

## 2020-04-21 DIAGNOSIS — M3130 Wegener's granulomatosis without renal involvement: Secondary | ICD-10-CM

## 2020-04-21 DIAGNOSIS — J4550 Severe persistent asthma, uncomplicated: Secondary | ICD-10-CM

## 2020-04-21 DIAGNOSIS — D801 Nonfamilial hypogammaglobulinemia: Secondary | ICD-10-CM | POA: Diagnosis not present

## 2020-04-21 DIAGNOSIS — J324 Chronic pansinusitis: Secondary | ICD-10-CM | POA: Diagnosis not present

## 2020-04-21 DIAGNOSIS — J3089 Other allergic rhinitis: Secondary | ICD-10-CM | POA: Diagnosis not present

## 2020-04-21 DIAGNOSIS — D849 Immunodeficiency, unspecified: Secondary | ICD-10-CM | POA: Diagnosis not present

## 2020-04-21 MED ORDER — LEVOCETIRIZINE DIHYDROCHLORIDE 5 MG PO TABS
5.0000 mg | ORAL_TABLET | Freq: Every evening | ORAL | 3 refills | Status: DC
Start: 2020-04-21 — End: 2021-04-13

## 2020-04-21 MED ORDER — BUDESONIDE-FORMOTEROL FUMARATE 160-4.5 MCG/ACT IN AERO: INHALATION_SPRAY | RESPIRATORY_TRACT | 3 refills | Status: DC

## 2020-04-21 MED ORDER — ALBUTEROL SULFATE HFA 108 (90 BASE) MCG/ACT IN AERS
INHALATION_SPRAY | RESPIRATORY_TRACT | 3 refills | Status: DC
Start: 2020-04-21 — End: 2021-06-22

## 2020-04-21 MED ORDER — ESOMEPRAZOLE MAGNESIUM 40 MG PO CPDR
40.0000 mg | DELAYED_RELEASE_CAPSULE | Freq: Two times a day (BID) | ORAL | 3 refills | Status: DC
Start: 2020-04-21 — End: 2021-02-23

## 2020-04-21 MED ORDER — MONTELUKAST SODIUM 10 MG PO TABS
10.0000 mg | ORAL_TABLET | Freq: Every day | ORAL | 3 refills | Status: DC
Start: 2020-04-21 — End: 2021-06-22

## 2020-04-21 NOTE — Patient Instructions (Addendum)
  1.  Treat and prevent inflammation:   A. Symbicort 160 - 2 inhalations 2 times per day    B. Montelukast 10 mg - 1 tablet 1 time per day  D. Benralizumab injections    2. Treat and prevent reflux:   A. Minimize caffeine consumption   B. Nexium 40 mg to 2 times per day  3. If needed:   A. albuterol HFA - 2 inhalations every 4-6 hours  B. OTC Antihistamine  4. Obtain Pneumovax and DT vaccine as a test to see if you respond to vaccines.  5. Check Pneumo 23 serotiters and DT titers and IgA/G/M, spep with reflex to immunofixation, Covid 19 IgG antibodies 6 weeks after immunizations.  6. Return to clinic in 12 weeks or earlier if problem

## 2020-04-21 NOTE — Progress Notes (Signed)
[facility] [place] [facility] [place]   Follow-up Note  Referring Provider: Tisovec, Fransico Him, MD Primary Provider: Haywood Pao, MD Date of Office Visit: 04/21/2020  Subjective:   Chad Morrison (DOB: [birth year]) is a 87 y.o. 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  His last visit to this clinic was 28 January 2020.  His airway has done wonderful.  He has no problems with his head or chest.  He does not use a short acting bronchodilator.  He continues on Symbicort and montelukast and benralizumab.  He has had no issues with reflux while using Nexium twice a day.  He still has not received his Pneumovax or his DT vaccination.  He will be tapering off his methotrexate soon.  He is currently on 10 mg weekly.  Apparently he is visiting with his [facility] rheumatologist on 28 May 2020 and he may be discontinuing his methotrexate at that point.  Allergies as of 04/21/2020      Reactions   Penicillins Hives   Has patient had a PCN reaction causing immediate rash, facial/tongue/throat swelling, SOB or lightheadedness with hypotension: No Has patient had a PCN reaction causing severe rash involving mucus membranes or skin necrosis: No Has patient had a PCN reaction that required hospitalization: No Has patient had a PCN reaction occurring within the last 10 years: Yes If all of the above answers are "NO", then may proceed with Cephalosporin use.   Sulfa Antibiotics Other (See Comments)   Unknown to patient   Latex Hives, Rash      Medication List    albuterol 108 (90 Base) MCG/ACT inhaler Commonly known as: VENTOLIN HFA USE 1 TO 2  INHALATIONS     ORALLY EVERY 6 HOURS AS    NEEDED FOR WHEEZING OR     SHORTNESS OF BREATH   ALPRAZolam 0.25 MG tablet Commonly known as: XANAX Take 0.25 mg by mouth at bedtime as needed for sleep.   azelastine 0.1 % nasal spray Commonly known as: ASTELIN USE 1 SPRAY IN EACH NOSTRIL TWICE DAILY AS DIRECTED   budesonide-formoterol 160-4.5 MCG/ACT inhaler Commonly known as: Symbicort Inhale 2 puffs into the lungs 2 (two) times daily.   Symbicort 160-4.5 MCG/ACT inhaler Generic drug: budesonide-formoterol USE 2 INHALATIONS ORALLY   EVERY MORNING AND AT       BEDTIME   Calcium-Vitamin D 600-125 MG-UNIT Tabs Take 1 tablet by mouth 2 (two) times daily.   chlorpheniramine 4 MG tablet Commonly known as: Chlor-Trimeton Please start taking chlorpheniramine (aka Chlor tabs) 4 mg tablet (1 to 2 tablets at night) for management of allergies and postnasal drip at night. This medication is sedating   diltiazem 180 MG 24 hr capsule Commonly known as: Cardizem CD Take 1 capsule (180 mg total) by mouth in the morning and at bedtime.   diphenhydrAMINE 25 MG tablet Commonly known as: BENADRYL Take 50 mg by mouth at bedtime as needed.   Eliquis 5 MG Tabs tablet Generic drug: apixaban TAKE 1 TABLET TWICE A DAY   EPINEPHrine 0.3 mg/0.3 mL Soaj injection Commonly known as: EPI-PEN SMARTSIG:0.3 Milligram(s) IM Once   esomeprazole 40 MG capsule Commonly known as: NexIUM Take 1 capsule (40 mg total)  by mouth 2 (two) times daily before a meal.   folic acid 1 MG tablet Commonly known as: FOLVITE Take 2 mg by mouth daily.   levocetirizine 5 MG tablet Commonly known as: XYZAL Take 1 tablet (5 mg total) by mouth every evening.   levothyroxine 75 MCG tablet Commonly known as: SYNTHROID Take 75 mcg by mouth daily before breakfast.   methotrexate 2.5 MG tablet 6 tabs weekly   montelukast 10 MG tablet Commonly known as: SINGULAIR TAKE 1 TABLET AT BEDTIME   multivitamin with minerals Tabs  tablet Take 1 tablet daily by mouth.   OCUVITE PRESERVISION PO Take 1 tablet 2 (two) times daily by mouth.   predniSONE 10 MG tablet Commonly known as: DELTASONE 4 tabs for 2 days, then 3 tabs for 2 days, 2 tabs for 2 days, then 1 tab for 2 days, then stop   rosuvastatin 5 MG tablet Commonly known as: Crestor Take 1 tablet (5 mg total) by mouth daily.   SLOW FE PO Take by mouth.   VITAMIN C PO Take by mouth. Not sure the dosage       Past Medical History:  Diagnosis Date  . Anxiety   . Asthma   . Asthmatic bronchitis   . Atrial flutter (HCC)    chads2vasc score is 3  . Ehrlichiosis 3710  . GERD (gastroesophageal reflux disease)   . Granulomatosis with polyangiitis ([place])   . HTN (hypertension)   . Hyperlipidemia   . Paroxysmal atrial fibrillation (HCC)   . Prostate cancer ([place])   . Seasonal allergies     Past Surgical History:  Procedure Laterality Date  . BASAL CELL CARCINOMA EXCISION  2013   EYELID  . HERNIA REPAIR Bilateral 2000, 2002  . KNEE ARTHROSCOPY  2000  . prostectomy  1995   RADICAL  . TONSILLECTOMY  1940    Review of systems negative except as noted in HPI / PMHx or noted below:  Review of Systems  Constitutional: Negative.   HENT: Negative.   Eyes: Negative.   Respiratory: Negative.   Cardiovascular: Negative.   Gastrointestinal: Negative.   Genitourinary: Negative.   Musculoskeletal: Negative.   Skin: Negative.   Neurological: Negative.   Endo/Heme/Allergies: Negative.   Psychiatric/Behavioral: Negative.      Objective:   Vitals:   04/21/20 1122  BP: 128/80  Pulse: 80  Temp: 97.7 F (36.5 C)  SpO2: 97%   Height: 5\' 9"  (175.3 cm)  Weight: 155 lb (70.3 kg)   Physical Exam Constitutional:      Appearance: He is not diaphoretic.  HENT:     Head: Normocephalic.     Right Ear: Tympanic membrane, ear canal and external ear normal.     Left Ear: Tympanic membrane, ear canal and external ear normal.     Nose: Nose normal. No  mucosal edema (Septal perforation) or rhinorrhea.     Mouth/Throat:     Mouth: Oropharynx is clear and moist and mucous membranes are normal.     Pharynx: Uvula midline. No oropharyngeal exudate.  Eyes:     Conjunctiva/sclera: Conjunctivae normal.  Neck:     Thyroid: No thyromegaly.     Trachea: Trachea normal. No tracheal tenderness or tracheal deviation.  Cardiovascular:     Rate and Rhythm: Normal rate and regular rhythm.     Heart sounds: S1 normal and S2 normal. Murmur (High-pitched systolic) heard.    Pulmonary:     Effort: No respiratory distress.     Breath  sounds: Normal breath sounds. No stridor. No wheezing or rales.  Musculoskeletal:        General: No edema.  Lymphadenopathy:     Head:     Right side of head: No tonsillar adenopathy.     Left side of head: No tonsillar adenopathy.     Cervical: No cervical adenopathy.  Skin:    Findings: No erythema or rash.     Nails: There is no clubbing.  Neurological:     Mental Status: He is alert.     Diagnostics:    Spirometry was performed and demonstrated an FEV1 of 2.46 at 98 % of predicted.  The patient had an Asthma Control Test with the following results: ACT Total Score: 21.    Assessment and Plan:   1. Asthma, severe persistent, well-controlled   2. Chronic pansinusitis   3. Perennial allergic rhinitis   4. Hypogammaglobulinemia ([place])   5. Immunosuppression ([place])   6. 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  Treat and prevent inflammation:   A. Symbicort 160 - 2 inhalations 2 times per day    B. Montelukast 10 mg - 1 tablet 1 time per day  D. Benralizumab injections    2. Treat and prevent reflux:   A. Minimize caffeine consumption   B. Nexium 40 mg to 2 times per day  3. If needed:   A. albuterol HFA - 2 inhalations every 4-6 hours  B. OTC Antihistamine  4. Obtain Pneumovax and DT vaccine as a test to see if you respond to vaccines.  5. Check Pneumo 23  serotiters and DT titers and IgA/G/M, spep with reflex to immunofixation, Covid 19 IgG antibodies 6 weeks after immunizations.  6. Return to clinic in 12 weeks or earlier if problem  Gene is really doing very well and his improvement appears to come in the context of using benralizumab injections which has been the one variable that has resulted in dramatic improvement regarding both his upper and lower airway disease.  He does have a history of hypogammaglobulinemia and we need to work through whether or not he can respond to immunogens including a Pneumovax and diphtheria as he has only 50% protective antibodies against pneumococcus species on a Pneumo 23 assay and has no antibodies directed against diphtheria.  It has been difficult to have his primary care doctor administer these medications and I suggested to Gene that he go to the health clinic or possibly even [facility] to get these immunogens.  It sounds as though Gene will be coming off his methotrexate at some point in the near future which is obviously a very good development.  Allena Katz, MD Allergy / Immunology [place]

## 2020-04-22 ENCOUNTER — Encounter: Payer: Self-pay | Admitting: Allergy and Immunology

## 2020-04-23 DIAGNOSIS — C44319 Basal cell carcinoma of skin of other parts of face: Secondary | ICD-10-CM | POA: Diagnosis not present

## 2020-04-23 DIAGNOSIS — C44519 Basal cell carcinoma of skin of other part of trunk: Secondary | ICD-10-CM | POA: Diagnosis not present

## 2020-04-23 DIAGNOSIS — L570 Actinic keratosis: Secondary | ICD-10-CM | POA: Diagnosis not present

## 2020-04-23 DIAGNOSIS — D485 Neoplasm of uncertain behavior of skin: Secondary | ICD-10-CM | POA: Diagnosis not present

## 2020-04-23 DIAGNOSIS — D225 Melanocytic nevi of trunk: Secondary | ICD-10-CM | POA: Diagnosis not present

## 2020-04-27 ENCOUNTER — Other Ambulatory Visit (HOSPITAL_COMMUNITY): Payer: Self-pay

## 2020-04-27 MED ORDER — APIXABAN 5 MG PO TABS
5.0000 mg | ORAL_TABLET | Freq: Two times a day (BID) | ORAL | 3 refills | Status: DC
Start: 2020-04-27 — End: 2021-02-23

## 2020-04-27 MED ORDER — DILTIAZEM HCL ER COATED BEADS 180 MG PO CP24
180.0000 mg | ORAL_CAPSULE | Freq: Two times a day (BID) | ORAL | 3 refills | Status: DC
Start: 2020-04-27 — End: 2021-02-23

## 2020-05-05 ENCOUNTER — Other Ambulatory Visit: Payer: Self-pay

## 2020-05-05 ENCOUNTER — Encounter: Payer: Self-pay | Admitting: Pulmonary Disease

## 2020-05-05 ENCOUNTER — Ambulatory Visit (INDEPENDENT_AMBULATORY_CARE_PROVIDER_SITE_OTHER): Payer: Medicare Other | Admitting: Pulmonary Disease

## 2020-05-05 VITALS — BP 112/68 | HR 65 | Temp 97.7°F | Ht 69.0 in | Wt 158.0 lb

## 2020-05-05 DIAGNOSIS — J4550 Severe persistent asthma, uncomplicated: Secondary | ICD-10-CM | POA: Diagnosis not present

## 2020-05-05 NOTE — Patient Instructions (Signed)
I am glad you are feeling better on the asthma regimen Continue the inhalers and injections as ordered Follow-up in 6 months

## 2020-05-05 NOTE — Progress Notes (Signed)
Chad Morrison    [id number]    November 02, 1932  Primary Care Physician:Tisovec, Fransico Him, MD  Referring Physician: Haywood Pao, MD [address] [place],  [place] [address]  Chief complaint: Follow-up for asthma  HPI: 87 year old with severe asthma, acid reflux, LPR, hypogammaglobilinemia, granulomatosis with polyangiitis Previously followed by Dr. Shelbie Hutching on Symbicort inhaler. He was recently evaluated by Dr. Neldon Mc from allergy and started on Fasenra in Oct 2021.   History notable for granulomatosis with polyangiitis.  He follows at [facility] for this.  Previously on methotrexate and prednisone.  His prednisone has been weaned off and methotrexate is coming down as well.  He feels that his asthma symptoms were under good control while he was on the prednisone but has flared up once he was offered.  Prednisone tapers usually helps him with his symptoms.  Continues on Nexium for GERD.  He was started on budesonide until nasal sprays and Nexium increased to twice a day by Dr. Neldon Mc but has been noncompliant with this change  Pets: No pets Occupation: Retired Financial planner Exposures: No known exposures.  No mold, hot tub, Jacuzzi.  No feather pillows or comforter Smoking history: 4-pack-year smoker.  Quit in 1963 Travel history: Originally from [place].  No significant recent travel Relevant family history: No significant family history of lung disease  Interim history: None continues on [place] States that breathing is better with no issues  Outpatient Encounter Medications as of 05/05/2020  Medication Sig  . albuterol (VENTOLIN HFA) 108 (90 Base) MCG/ACT inhaler USE 1 TO 2 INHALATIONS     ORALLY EVERY 6 HOURS AS    NEEDED FOR WHEEZING OR     SHORTNESS OF BREATH  . ALPRAZolam (XANAX) 0.25 MG tablet Take 0.25 mg by mouth at bedtime as needed for sleep.   Marland Kitchen apixaban (ELIQUIS) 5 MG TABS tablet Take 1 tablet (5 mg total) by mouth 2 (two) times daily.  . Ascorbic  Acid (VITAMIN C PO) Take by mouth. Not sure the dosage  . azelastine (ASTELIN) 0.1 % nasal spray USE 1 SPRAY IN EACH NOSTRIL TWICE DAILY AS DIRECTED  . Benralizumab (FASENRA [place]) Inject into the skin. Every 8 weeks  . budesonide-formoterol (SYMBICORT) 160-4.5 MCG/ACT inhaler Inhale 2 puffs into the lungs 2 (two) times daily.  . budesonide-formoterol (SYMBICORT) 160-4.5 MCG/ACT inhaler USE 2 INHALATIONS ORALLY   EVERY MORNING AND AT       BEDTIME  . Calcium Carbonate-Vitamin D (CALCIUM-VITAMIN D) 600-125 MG-UNIT TABS Take 1 tablet by mouth 2 (two) times daily.   Marland Kitchen diltiazem (CARDIZEM CD) 180 MG 24 hr capsule Take 1 capsule (180 mg total) by mouth in the morning and at bedtime.  . diphenhydrAMINE (BENADRYL) 25 MG tablet Take 50 mg by mouth at bedtime as needed.   Marland Kitchen EPINEPHrine 0.3 mg/0.3 mL IJ SOAJ injection SMARTSIG:0.3 Milligram(s) IM Once  . esomeprazole (NEXIUM) 40 MG capsule Take 1 capsule (40 mg total) by mouth 2 (two) times daily before a meal.  . Ferrous Sulfate (SLOW FE PO) Take by mouth.  . folic acid (FOLVITE) 1 MG tablet Take 2 mg by mouth daily.   Marland Kitchen levocetirizine (XYZAL) 5 MG tablet Take 1 tablet (5 mg total) by mouth every evening.  Marland Kitchen levothyroxine (SYNTHROID, LEVOTHROID) 75 MCG tablet Take 75 mcg by mouth daily before breakfast.  . methotrexate 2.5 MG tablet 2.5 mg. Takes 4 tablets weekly  . montelukast (SINGULAIR) 10 MG tablet Take 1 tablet (  10 mg total) by mouth at bedtime.  . Multiple Vitamin (MULTIVITAMIN WITH MINERALS) TABS tablet Take 1 tablet daily by mouth.  . Multiple Vitamins-Minerals (OCUVITE PRESERVISION PO) Take 1 tablet 2 (two) times daily by mouth.  . rosuvastatin (CRESTOR) 5 MG tablet Take 1 tablet (5 mg total) by mouth daily.  . [DISCONTINUED] chlorpheniramine (CHLOR-TRIMETON) 4 MG tablet Please start taking chlorpheniramine (aka Chlor tabs) 4 mg tablet (1 to 2 tablets at night) for management of allergies and postnasal drip at night. This medication is sedating  (Patient not taking: No sig reported)  . [DISCONTINUED] predniSONE (DELTASONE) 10 MG tablet 4 tabs for 2 days, then 3 tabs for 2 days, 2 tabs for 2 days, then 1 tab for 2 days, then stop (Patient not taking: Reported on 04/21/2020)   Facility-Administered Encounter Medications as of 05/05/2020  Medication  . Benralizumab SOSY 30 mg   Physical Exam: Blood pressure 112/68, pulse 65, temperature 97.7 F (36.5 C), temperature source Temporal, height 5\' 9"  (1.753 m), weight 158 lb (71.7 kg), SpO2 98 %. Gen:      No acute distress HEENT:  EOMI, sclera anicteric Neck:     No masses; no thyromegaly Lungs:    Clear to auscultation bilaterally; normal respiratory effort CV:         Regular rate and rhythm; no murmurs Abd:      + bowel sounds; soft, non-tender; no palpable masses, no distension Ext:    No edema; adequate peripheral perfusion Skin:      Warm and dry; no rash Neuro: alert and oriented x 3 Psych: normal mood and affect  Data Reviewed: Imaging: High-resolution CT 12/22/2017-mild bibasal groundglass with mild reticulation.  Pulmonary nodules. CT chest 05/06/2019-stable pulmonary nodules since 2019.  Scattered linear atelectasis and mild bronchiectasis I have reviewed the images personally.  PFTs: 05/24/2017 FVC 3.78 [96%], FEV1 2.44 [88%], F/F 65, TLC 7.41 [104%], DLCO 22.81 [90%] Moderate obstruction with air trapping and mild diffusion impairment  ACT score 12/07/2019- 19 ACT score 05/05/2020- 23  Labs: CBC 10/22/2019-WBC 5.7, eos 14%, absolute eosinophil count 798  Assessment:  Severe persistent asthma with eosinophilia Sinusitis Continue Symbicort inhaler and Fasenra with good response Follows with Dr. Tamsen Roers with polyangiitis On methotrexate per rheumatology His CT scan continues to show mild basal reticulation and groundglass which we will monitor  Plan/Recommendations: Continue Symbicort, Fasenra  Follow-up in 6 months  Marshell Garfinkel MD [place]  [facility] 05/05/2020, 12:15 PM  CC: Tisovec, Fransico Him, MD

## 2020-05-05 NOTE — Progress Notes (Signed)
Chad Morrison    [id number]    07/18/1932  Primary Care Physician:Tisovec, Fransico Him, MD  Referring Physician: Haywood Pao, MD [address] [place],  [place] [address]  Chief complaint: Follow-up for asthma  HPI: 87 year old with severe asthma, acid reflux, LPR, hypogammaglobilinemia, granulomatosis with polyangiitis Previously followed by Dr. Shelbie Hutching on Symbicort inhaler. He was recently evaluated by Dr. Neldon Mc from allergy and started on Nucala.  He received his first dose yesterday and reports increased sinus congestion, postnasal drip with cough.  States that breathing is doing okay.  He is somewhat disappointed that he has not felt immediately better.  History notable for granulomatosis with polyangiitis.  He follows at [facility] for this.  Previously on methotrexate and prednisone.  His prednisone has been weaned off and methotrexate is coming down as well.  He feels that his asthma symptoms were under good control while he was on the prednisone but has flared up once he was offered.  Prednisone tapers usually helps him with his symptoms.  Continues on Nexium for GERD.  He was started on budesonide until nasal sprays and Nexium increased to twice a day by Dr. Neldon Mc but has been noncompliant with this change  Pets: No pets Occupation: Retired Financial planner Exposures: No known exposures.  No mold, hot tub, Jacuzzi.  No feather pillows or comforter Smoking history: 4-pack-year smoker.  Quit in 1963 Travel history: Originally from [place].  No significant recent travel Relevant family history: No significant family history of lung disease   Outpatient Encounter Medications as of 05/05/2020  Medication Sig  . albuterol (VENTOLIN HFA) 108 (90 Base) MCG/ACT inhaler USE 1 TO 2 INHALATIONS     ORALLY EVERY 6 HOURS AS    NEEDED FOR WHEEZING OR     SHORTNESS OF BREATH  . ALPRAZolam (XANAX) 0.25 MG tablet Take 0.25 mg by mouth at bedtime as needed for sleep.    Marland Kitchen apixaban (ELIQUIS) 5 MG TABS tablet Take 1 tablet (5 mg total) by mouth 2 (two) times daily.  . Ascorbic Acid (VITAMIN C PO) Take by mouth. Not sure the dosage  . azelastine (ASTELIN) 0.1 % nasal spray USE 1 SPRAY IN EACH NOSTRIL TWICE DAILY AS DIRECTED  . Benralizumab (FASENRA [place]) Inject into the skin. Every 8 weeks  . budesonide-formoterol (SYMBICORT) 160-4.5 MCG/ACT inhaler Inhale 2 puffs into the lungs 2 (two) times daily.  . budesonide-formoterol (SYMBICORT) 160-4.5 MCG/ACT inhaler USE 2 INHALATIONS ORALLY   EVERY MORNING AND AT       BEDTIME  . Calcium Carbonate-Vitamin D (CALCIUM-VITAMIN D) 600-125 MG-UNIT TABS Take 1 tablet by mouth 2 (two) times daily.   Marland Kitchen diltiazem (CARDIZEM CD) 180 MG 24 hr capsule Take 1 capsule (180 mg total) by mouth in the morning and at bedtime.  . diphenhydrAMINE (BENADRYL) 25 MG tablet Take 50 mg by mouth at bedtime as needed.   Marland Kitchen EPINEPHrine 0.3 mg/0.3 mL IJ SOAJ injection SMARTSIG:0.3 Milligram(s) IM Once  . esomeprazole (NEXIUM) 40 MG capsule Take 1 capsule (40 mg total) by mouth 2 (two) times daily before a meal.  . Ferrous Sulfate (SLOW FE PO) Take by mouth.  . folic acid (FOLVITE) 1 MG tablet Take 2 mg by mouth daily.   Marland Kitchen levocetirizine (XYZAL) 5 MG tablet Take 1 tablet (5 mg total) by mouth every evening.  Marland Kitchen levothyroxine (SYNTHROID, LEVOTHROID) 75 MCG tablet Take 75 mcg by mouth daily before breakfast.  . methotrexate 2.5 MG  tablet 2.5 mg. Takes 4 tablets weekly  . montelukast (SINGULAIR) 10 MG tablet Take 1 tablet (10 mg total) by mouth at bedtime.  . Multiple Vitamin (MULTIVITAMIN WITH MINERALS) TABS tablet Take 1 tablet daily by mouth.  . Multiple Vitamins-Minerals (OCUVITE PRESERVISION PO) Take 1 tablet 2 (two) times daily by mouth.  . rosuvastatin (CRESTOR) 5 MG tablet Take 1 tablet (5 mg total) by mouth daily.  . [DISCONTINUED] chlorpheniramine (CHLOR-TRIMETON) 4 MG tablet Please start taking chlorpheniramine (aka Chlor tabs) 4 mg tablet (1 to  2 tablets at night) for management of allergies and postnasal drip at night. This medication is sedating (Patient not taking: No sig reported)  . [DISCONTINUED] predniSONE (DELTASONE) 10 MG tablet 4 tabs for 2 days, then 3 tabs for 2 days, 2 tabs for 2 days, then 1 tab for 2 days, then stop (Patient not taking: Reported on 04/21/2020)   Facility-Administered Encounter Medications as of 05/05/2020  Medication  . Benralizumab SOSY 30 mg   Physical Exam: Blood pressure 122/60, pulse 84, temperature 97.6 F (36.4 C), temperature source Temporal, height 5\' 9"  (1.753 m), weight 149 lb 6.4 oz (67.8 kg), SpO2 98 %. Gen:      No acute distress HEENT:  EOMI, sclera anicteric Neck:     No masses; no thyromegaly Lungs:    Clear to auscultation bilaterally; normal respiratory effort CV:         Regular rate and rhythm; no murmurs Abd:      + bowel sounds; soft, non-tender; no palpable masses, no distension Ext:    No edema; adequate peripheral perfusion Skin:      Warm and dry; no rash Neuro: alert and oriented x 3 Psych: normal mood and affect  Data Reviewed: Imaging: High-resolution CT 12/22/2017-mild bibasal groundglass with mild reticulation.  Pulmonary nodules. CT chest 05/06/2019-stable pulmonary nodules since 2019.  Scattered linear atelectasis and mild bronchiectasis I have reviewed the images personally.  PFTs: 05/24/2017 FVC 3.78 [96%], FEV1 2.44 [88%], F/F 65, TLC 7.41 [104%], DLCO 22.81 [90%] Moderate obstruction with air trapping and mild diffusion impairment  ACT score 12/07/2019- 19  Labs: CBC 10/22/2019-WBC 5.7, eos 14%, absolute eosinophil count 798  Assessment:  Severe persistent asthma with eosinophilia Sinusitis Continue Symbicort inhaler.  Recently started on Nucala  He is disappointed that Nucala has not immediately made him better and is wondering if his increased postnasal discharge is due to [place].  We discussed the timeline of Nucala action and set expectations that it  may take weeks to months before he feels the full effect.  The sinus congestion and postnasal discharge is a longstanding problem for him and I do not think this is directly related to the Nucala injection  Encouraged him to use the Astelin and add budesonide nasal washes more regularly as he is noncompliant with them Advised that he can take Benadryl twice a day as needed  Granulomatosis with polyangiitis On methotrexate per rheumatology His CT scan continues to show mild basal reticulation and groundglass which we will monitor  Plan/Recommendations: Continue Symbicort, Nucala Astelin, budesonide nasal washes Benadryl over-the-counter  Marshell Garfinkel MD [place] [facility] 05/05/2020, 12:17 PM  CC: Tisovec, Fransico Him, MD

## 2020-05-14 ENCOUNTER — Encounter (INDEPENDENT_AMBULATORY_CARE_PROVIDER_SITE_OTHER): Payer: Self-pay | Admitting: Ophthalmology

## 2020-05-14 ENCOUNTER — Other Ambulatory Visit: Payer: Self-pay

## 2020-05-14 ENCOUNTER — Encounter (INDEPENDENT_AMBULATORY_CARE_PROVIDER_SITE_OTHER): Payer: Medicare Other | Admitting: Ophthalmology

## 2020-05-14 ENCOUNTER — Ambulatory Visit (INDEPENDENT_AMBULATORY_CARE_PROVIDER_SITE_OTHER): Payer: Medicare Other | Admitting: Ophthalmology

## 2020-05-14 DIAGNOSIS — H353211 Exudative age-related macular degeneration, right eye, with active choroidal neovascularization: Secondary | ICD-10-CM

## 2020-05-14 MED ORDER — RANIBIZUMAB 0.5 MG/0.05ML IZ SOSY
0.5000 mg | PREFILLED_SYRINGE | INTRAVITREAL | Status: AC | PRN
  Administered 2020-05-14: .5 mg via INTRAVITREAL

## 2020-05-14 MED ORDER — BROMFENAC SODIUM 0.07 % OP SOLN: 1.0000 [drp] | Freq: Every day | OPHTHALMIC | 6 refills | Status: DC

## 2020-05-14 NOTE — Assessment & Plan Note (Signed)
CME associated with occult CNVM, worse off of PROLENSA will resume PROLENSA today but yet will maintain use of Lucentis right eye

## 2020-05-14 NOTE — Progress Notes (Signed)
05/14/2020     CHIEF COMPLAINT Patient presents for Retina Follow Up (9 Wk F/U OU, poss Luc 0.5 OD//Pt denies noticeable changes to [place] OU since last visit. Pt denies ocular pain, flashes of light, or floaters OU. //)   HISTORY OF PRESENT ILLNESS: Chad Morrison is a 87 y.o. 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  This started 9 weeks ago.  Severity is mild.  Duration of 9 weeks.  Since onset it is stable. Additional comments: 9 Wk F/U OU, poss Luc 0.5 OD  Pt denies noticeable changes to [place] OU since last visit. Pt denies ocular pain, flashes of light, or floaters OU.          Last edited by Rockie Neighbours, [place] on 05/14/2020  2:58 PM. (History)      Referring physician: Haywood Pao, MD [place],  [place] [address]  HISTORICAL INFORMATION:   Selected notes from the [facility]: No current outpatient medications on file. (Ophthalmic Drugs)   No current facility-administered medications for this visit. (Ophthalmic Drugs)   Current Outpatient Medications (Other)  Medication Sig  . albuterol (VENTOLIN HFA) 108 (90 Base) MCG/ACT inhaler USE 1 TO 2 INHALATIONS     ORALLY EVERY 6 HOURS AS    NEEDED FOR WHEEZING OR     SHORTNESS OF BREATH  . ALPRAZolam (XANAX) 0.25 MG tablet Take 0.25 mg by mouth at bedtime as needed for sleep.   Marland Kitchen apixaban (ELIQUIS) 5 MG TABS tablet Take 1 tablet (5 mg total) by mouth 2 (two) times daily.  . Ascorbic Acid (VITAMIN C PO) Take by mouth. Not sure the dosage  . azelastine (ASTELIN) 0.1 % nasal spray USE 1 SPRAY IN EACH NOSTRIL TWICE DAILY AS DIRECTED  . Benralizumab (FASENRA [employer]) Inject into the skin. Every 8 weeks  . budesonide-formoterol (SYMBICORT) 160-4.5 MCG/ACT inhaler Inhale 2 puffs into the lungs 2 (two) times daily.  . budesonide-formoterol (SYMBICORT) 160-4.5 MCG/ACT inhaler USE 2 INHALATIONS ORALLY   EVERY  MORNING AND AT       BEDTIME  . Calcium Carbonate-Vitamin D (CALCIUM-VITAMIN D) 600-125 MG-UNIT TABS Take 1 tablet by mouth 2 (two) times daily.   Marland Kitchen diltiazem (CARDIZEM CD) 180 MG 24 hr capsule Take 1 capsule (180 mg total) by mouth in the morning and at bedtime.  . diphenhydrAMINE (BENADRYL) 25 MG tablet Take 50 mg by mouth at bedtime as needed.   Marland Kitchen EPINEPHrine 0.3 mg/0.3 mL IJ SOAJ injection SMARTSIG:0.3 Milligram(s) IM Once  . esomeprazole (NEXIUM) 40 MG capsule Take 1 capsule (40 mg total) by mouth 2 (two) times daily before a meal.  . Ferrous Sulfate (SLOW FE PO) Take by mouth.  . folic acid (FOLVITE) 1 MG tablet Take 2 mg by mouth daily.   Marland Kitchen levocetirizine (XYZAL) 5 MG tablet Take 1 tablet (5 mg total) by mouth every evening.  Marland Kitchen levothyroxine (SYNTHROID, LEVOTHROID) 75 MCG tablet Take 75 mcg by mouth daily before breakfast.  . methotrexate 2.5 MG tablet 2.5 mg. Takes 4 tablets weekly  . montelukast (SINGULAIR) 10 MG tablet Take 1 tablet (10 mg total) by mouth at bedtime.  . Multiple Vitamin (MULTIVITAMIN WITH MINERALS) TABS tablet Take 1 tablet daily by mouth.  . Multiple Vitamins-Minerals (OCUVITE PRESERVISION PO) Take 1 tablet 2 (two) times daily by  mouth.  . rosuvastatin (CRESTOR) 5 MG tablet Take 1 tablet (5 mg total) by mouth daily.   Current Facility-Administered Medications (Other)  Medication Route  . Benralizumab SOSY 30 mg Subcutaneous      REVIEW OF SYSTEMS:    ALLERGIES Allergies  Allergen Reactions  . Penicillins Hives    Has patient had a PCN reaction causing immediate rash, facial/tongue/throat swelling, SOB or lightheadedness with hypotension: No Has patient had a PCN reaction causing severe rash involving mucus membranes or skin necrosis: No Has patient had a PCN reaction that required hospitalization: No Has patient had a PCN reaction occurring within the last 10 years: Yes If all of the above answers are "NO", then may proceed with Cephalosporin use.   .  Sulfa Antibiotics Other (See Comments)    Unknown to patient   . Latex Hives and Rash    PAST MEDICAL HISTORY Past Medical History:  Diagnosis Date  . Anxiety   . Asthma   . Asthmatic bronchitis   . Atrial flutter (HCC)    chads2vasc score is 3  . Ehrlichiosis 8366  . GERD (gastroesophageal reflux disease)   . Granulomatosis with polyangiitis ([place])   . HTN (hypertension)   . Hyperlipidemia   . Paroxysmal atrial fibrillation (HCC)   . Prostate cancer ([place])   . Seasonal allergies    Past Surgical History:  Procedure Laterality Date  . BASAL CELL CARCINOMA EXCISION  2013   EYELID  . HERNIA REPAIR Bilateral 2000, 2002  . KNEE ARTHROSCOPY  2000  . prostectomy  1995   RADICAL  . TONSILLECTOMY  1940    FAMILY HISTORY Family History  Problem Relation Age of Onset  . Asthma Mother 35  . Allergies Mother   . Ulcers Mother   . CVA Father 12  . Heart Problems Father   . Other Father        NEUROLOGIC ISSUES   . Other Brother 11       BICYCLE ACCIDENT  . Migraines Son   . Diabetes Mellitus I Son     SOCIAL HISTORY Social History   Tobacco Use  . Smoking status: Former Smoker    Packs/day: 0.50    Years: 4.00    Pack years: 2.00    Types: Cigarettes    Quit date: 02/28/1961    Years since quitting: 59.2  . Smokeless tobacco: Never Used  Vaping Use  . Vaping Use: Never used  Substance Use Topics  . Alcohol use: Yes    Alcohol/week: 2.0 - 3.0 standard drinks    Types: 2 - 3 Standard drinks or equivalent per week  . Drug use: No         OPHTHALMIC EXAM: Base Eye Exam    Visual Acuity (ETDRS)      Right Left   Dist [place] 20/60 +2 20/30 +2   Dist ph [place] 20/40 +1 NI       Tonometry (Tonopen, 2:58 PM)      Right Left   Pressure 12 12       Pupils      Dark Light Shape React APD   Right 5 4 Round Slow None   Left 4 3 Round Slow None       Visual Fields (Counting fingers)      Left Right    Full Full       Extraocular Movement      Right Left     Full Full  Neuro/Psych    Oriented x3: Yes   Mood/Affect: Normal       Dilation    Both eyes: 1.0% Mydriacyl, 2.5% Phenylephrine @ 3:04 PM        Slit Lamp and Fundus Exam    External Exam      Right Left   External Normal Normal       Slit Lamp Exam      Right Left   Lids/Lashes Normal Normal   Conjunctiva/Sclera White and quiet White and quiet   Cornea Clear Clear   Anterior Chamber Deep and quiet has not, no remaining vitreous strand to the cornea Deep and quiet   Iris Round and reactive Round and reactive   Lens Posterior chamber intraocular lens 3+ Nuclear sclerosis   Anterior Vitreous Normal Normal       Fundus Exam      Right Left   Posterior Vitreous Posterior vitreous detachment    Disc Normal    C/D Ratio 0.65    Macula Normal, subfoveal drusenoid, no obvious CME    Vessels Normal    Periphery Normal           IMAGING AND PROCEDURES  Imaging and Procedures for 05/14/20  OCT, Retina - OU - Both Eyes       Right Eye Quality was good. Scan locations included subfoveal. Central Foveal Thickness: 454. Progression has improved. Findings include epiretinal membrane, cystoid macular edema, retinal drusen .   Left Eye Quality was good. Scan locations included subfoveal. Central Foveal Thickness: 350. Progression has been stable. Findings include abnormal foveal contour, retinal drusen .   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  Nonetheless is also less subretinal fluid as a component of the wet ARMD.  We are treating that component now and yet we will extend interval examination next       Intravitreal Injection, Pharmacologic Agent - OD - Right Eye       Time Out 05/14/2020. 3:23 PM. Confirmed correct patient, procedure, site, and patient consented.   Procedure Injection:  0.5 mg Ranibizumab SOSY (LUCENTIS) 0.5mg /0.78ml prefilled syr   NDC:  70017-494-49   Route: Intravitreal, Site: Right Eye, Waste: 0 mg  Post-op Post injection exam found visual acuity of at least counting fingers. The patient tolerated the procedure well. There were no complications. The patient received written and verbal post procedure care education. Post injection medications were not given.                 ASSESSMENT/PLAN:  Exudative age-related macular degeneration of right eye with active choroidal neovascularization ([place]) CME associated with occult CNVM, worse off of PROLENSA will resume PROLENSA today but yet will maintain use of Lucentis right eye      ICD-10-CM   1. Exudative age-related macular degeneration of right eye with active choroidal neovascularization (HCC)  H35.3211 OCT, Retina - OU - Both Eyes    Intravitreal Injection, Pharmacologic Agent - OD - Right Eye    Ranibizumab SOSY 0.5 mg    1.  CME OD worse now that patient has stopped use of PROLENSA per Dr. Atilano Median will thus resume PROLENSA once daily OD  2.  Component of disease is also occult CNVM will retreat Lucentis today and maintain same interval follow-up around 8 to 9 weeks  3.  Ophthalmic Meds Ordered this visit:  Meds ordered this encounter  Medications  . Ranibizumab  SOSY 0.5 mg       Return in about 8 weeks (around 07/09/2020) for dilate, OD, LUCENTIS 0.5 OCT.  There are no Patient Instructions on file for this visit.   Explained the diagnoses, plan, and follow up with the patient and they expressed understanding.  Patient expressed understanding of the importance of proper follow up care.   Clent Demark Amika Tassin M.D. Diseases & Surgery of the Retina and Vitreous Retina & Diabetic [place] 05/14/20     Abbreviations: M myopia (nearsighted); A astigmatism; H hyperopia (farsighted); P presbyopia; Mrx spectacle prescription;  CTL contact lenses; OD right eye; OS left eye; OU both eyes  XT exotropia; ET esotropia; PEK punctate epithelial keratitis; PEE  punctate epithelial erosions; DES dry eye syndrome; MGD meibomian gland dysfunction; ATs artificial tears; PFAT's preservative free artificial tears; [place] nuclear sclerotic cataract; PSC posterior subcapsular cataract; ERM epi-retinal membrane; PVD posterior vitreous detachment; RD retinal detachment; DM diabetes mellitus; DR diabetic retinopathy; NPDR non-proliferative diabetic retinopathy; PDR proliferative diabetic retinopathy; CSME clinically significant macular edema; DME diabetic macular edema; dbh dot blot hemorrhages; CWS cotton wool spot; POAG primary open angle glaucoma; C/D cup-to-disc ratio; HVF humphrey visual field; GVF goldmann visual field; OCT optical coherence tomography; IOP intraocular pressure; BRVO Branch retinal vein occlusion; CRVO central retinal vein occlusion; CRAO central retinal artery occlusion; BRAO branch retinal artery occlusion; RT retinal tear; SB scleral buckle; PPV pars plana vitrectomy; VH Vitreous hemorrhage; PRP panretinal laser photocoagulation; IVK intravitreal kenalog; VMT vitreomacular traction; MH Macular hole;  NVD neovascularization of the disc; NVE neovascularization elsewhere; AREDS age related eye disease study; ARMD age related macular degeneration; POAG primary open angle glaucoma; EBMD epithelial/anterior basement membrane dystrophy; ACIOL anterior chamber intraocular lens; IOL intraocular lens; PCIOL posterior chamber intraocular lens; Phaco/IOL phacoemulsification with intraocular lens placement; [place] photorefractive keratectomy; LASIK laser assisted in situ keratomileusis; HTN hypertension; DM diabetes mellitus; COPD chronic obstructive pulmonary disease

## 2020-05-20 DIAGNOSIS — J4550 Severe persistent asthma, uncomplicated: Secondary | ICD-10-CM | POA: Diagnosis not present

## 2020-05-21 ENCOUNTER — Ambulatory Visit (INDEPENDENT_AMBULATORY_CARE_PROVIDER_SITE_OTHER): Payer: Medicare Other | Admitting: *Deleted

## 2020-05-21 DIAGNOSIS — J4550 Severe persistent asthma, uncomplicated: Secondary | ICD-10-CM

## 2020-06-02 DIAGNOSIS — C44612 Basal cell carcinoma of skin of right upper limb, including shoulder: Secondary | ICD-10-CM | POA: Diagnosis not present

## 2020-06-02 DIAGNOSIS — C44519 Basal cell carcinoma of skin of other part of trunk: Secondary | ICD-10-CM | POA: Diagnosis not present

## 2020-06-11 ENCOUNTER — Other Ambulatory Visit: Payer: Self-pay | Admitting: Allergy and Immunology

## 2020-06-11 NOTE — Telephone Encounter (Signed)
Please advise change in medication.

## 2020-06-16 ENCOUNTER — Other Ambulatory Visit (HOSPITAL_COMMUNITY): Payer: Self-pay | Admitting: Physician Assistant

## 2020-06-16 ENCOUNTER — Other Ambulatory Visit: Payer: Self-pay | Admitting: *Deleted

## 2020-06-16 NOTE — Telephone Encounter (Signed)
Called and spoke with the patient and he stated that his pulmonologist fills his Symbicort. He is going to call and speak with him first to double verify before he wants [place] to send in an alternative.

## 2020-06-22 ENCOUNTER — Ambulatory Visit (HOSPITAL_COMMUNITY)
Admission: RE | Admit: 2020-06-22 | Discharge: 2020-06-22 | Disposition: A | Discharge status: Home / Self Care | Payer: Medicare Other | Source: Ambulatory Visit | Attending: Physician Assistant | Admitting: Physician Assistant

## 2020-06-22 ENCOUNTER — Other Ambulatory Visit: Payer: Self-pay

## 2020-06-22 ENCOUNTER — Encounter (HOSPITAL_COMMUNITY): Payer: Self-pay | Admitting: Physician Assistant

## 2020-06-22 VITALS — BP 140/78 | HR 82 | Ht 69.0 in | Wt 157.6 lb

## 2020-06-22 DIAGNOSIS — I4821 Permanent atrial fibrillation: Secondary | ICD-10-CM

## 2020-06-22 DIAGNOSIS — I350 Nonrheumatic aortic (valve) stenosis: Secondary | ICD-10-CM | POA: Insufficient documentation

## 2020-06-22 DIAGNOSIS — I4892 Unspecified atrial flutter: Secondary | ICD-10-CM | POA: Insufficient documentation

## 2020-06-22 DIAGNOSIS — Z79899 Other long term (current) drug therapy: Secondary | ICD-10-CM | POA: Diagnosis not present

## 2020-06-22 DIAGNOSIS — D6869 Other thrombophilia: Secondary | ICD-10-CM | POA: Diagnosis not present

## 2020-06-22 DIAGNOSIS — Z7901 Long term (current) use of anticoagulants: Secondary | ICD-10-CM | POA: Insufficient documentation

## 2020-06-22 DIAGNOSIS — Z87891 Personal history of nicotine dependence: Secondary | ICD-10-CM | POA: Diagnosis not present

## 2020-06-22 LAB — COMPREHENSIVE METABOLIC PANEL
ALT: 19 U/L (ref 0–44)
AST: 22 U/L (ref 15–41)
Albumin: 3.5 g/dL (ref 3.5–5.0)
Alkaline Phosphatase: 68 U/L (ref 38–126)
Anion gap: 8 (ref 5–15)
BUN: 17 mg/dL (ref 8–23)
CO2: 21 mmol/L — ABNORMAL LOW (ref 22–32)
Calcium: 8.7 mg/dL — ABNORMAL LOW (ref 8.9–10.3)
Chloride: 106 mmol/L (ref 98–111)
Creatinine, Ser: 1.2 mg/dL (ref 0.61–1.24)
GFR, Estimated: 59 mL/min — ABNORMAL LOW (ref >60)
Glucose, Bld: 135 mg/dL — ABNORMAL HIGH (ref 70–99)
Potassium: 3.4 mmol/L — ABNORMAL LOW (ref 3.5–5.1)
Sodium: 135 mmol/L (ref 135–145)
Total Bilirubin: 0.8 mg/dL (ref 0.3–1.2)
Total Protein: 6.4 g/dL — ABNORMAL LOW (ref 6.5–8.1)

## 2020-06-22 LAB — CBC
HCT: 37.3 % — ABNORMAL LOW (ref 39.0–52.0)
Hemoglobin: 12.3 g/dL — ABNORMAL LOW (ref 13.0–17.0)
MCH: 29.6 pg (ref 26.0–34.0)
MCHC: 33 g/dL (ref 30.0–36.0)
MCV: 89.9 fL (ref 80.0–100.0)
Platelets: 179 10*3/uL (ref 150–400)
RBC: 4.15 MIL/uL — ABNORMAL LOW (ref 4.22–5.81)
RDW: 14.6 % (ref 11.5–15.5)
WBC: 5.2 10*3/uL (ref 4.0–10.5)
nRBC: 0 % (ref 0.0–0.2)

## 2020-06-22 NOTE — Progress Notes (Signed)
Primary Care Physician: Tisovec, Fransico Him, MD Referring Physician: Dr. Rosalyn Charters Hallums is a 87 y.o. male with a h/o typical atrial flutter, on amiodarone for evaluation in the afib clinic. He saw Dr. [name] in August and was c/o of shortness of breath. There was concern of amiodarone toxicity and pt asked to reduce amio to 100 mg a day. He had refused typical atrial flutter ablation in the past. He saw pulmonology and found to be in rapid atrial fibrillation. Dr. [name]  was notified  and he was asked to have f/u here. He increased his amio back to 200 mg. Pulmonology is planning to repeat PFT's when his heart rate is controlled. 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 Pulmonology  questioned if lungs were also involved contributing to increased shortness of breath. He saw Dr. [name] 10/10 Amiodarone was stopped and Multaq was started. He is on Eliquis for a CHADS2VASC score of 3. Multaq was discontinued 2/2 persistent atrial fibrillation.   On follow up today, patient reports he has done well since his last visit. He denies any heart racing or palpitations. He denies any bleeding issues on anticoagulation.   Today, he denies symptoms of palpitations, chest pain, orthopnea, PND, lower extremity edema, dizziness, presyncope, syncope, or neurologic sequela. The patient is tolerating medications without difficulties and is otherwise without complaint today.  Past Medical History:  Diagnosis Date  . Anxiety   . Asthma   . Asthmatic bronchitis   . Atrial flutter (HCC)    chads2vasc score is 3  . Ehrlichiosis 2423  . GERD (gastroesophageal reflux disease)   . 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 HTN (hypertension)   . Hyperlipidemia   . Paroxysmal atrial fibrillation (HCC)   . Prostate cancer ([place])   . Seasonal allergies    Past Surgical History:  Procedure Laterality Date  . BASAL CELL CARCINOMA EXCISION  2013   EYELID  . HERNIA REPAIR Bilateral  2000, 2002  . KNEE ARTHROSCOPY  2000  . prostectomy  1995   RADICAL  . TONSILLECTOMY  1940    Current Outpatient Medications  Medication Sig Dispense Refill  . albuterol (VENTOLIN HFA) 108 (90 Base) MCG/ACT inhaler USE 1 TO 2 INHALATIONS     ORALLY EVERY 6 HOURS AS    NEEDED FOR WHEEZING OR     SHORTNESS OF BREATH 54 g 3  . ALPRAZolam (XANAX) 0.25 MG tablet Take 0.25 mg by mouth at bedtime as needed for sleep.     Marland Kitchen apixaban (ELIQUIS) 5 MG TABS tablet Take 1 tablet (5 mg total) by mouth 2 (two) times daily. 180 tablet 3  . Ascorbic Acid (VITAMIN C PO) Take by mouth. Not sure the dosage    . azelastine (ASTELIN) 0.1 % nasal spray USE 1 SPRAY IN EACH NOSTRIL TWICE DAILY AS DIRECTED 30 mL 5  . Benralizumab (FASENRA [place]) Inject into the skin. Every 8 weeks    . Bromfenac Sodium 0.07 % SOLN Place 1 drop into the right eye daily. 5 mL 6  . budesonide-formoterol (SYMBICORT) 160-4.5 MCG/ACT inhaler Inhale 2 puffs into the lungs 2 (two) times daily. 3 each 1  . budesonide-formoterol (SYMBICORT) 160-4.5 MCG/ACT inhaler USE 2 INHALATIONS ORALLY   EVERY MORNING AND AT       BEDTIME 30.6 g 3  . Calcium Carbonate-Vitamin D (CALCIUM-VITAMIN D) 600-125 MG-UNIT TABS Take 1 tablet by mouth 2 (two) times daily.     Marland Kitchen diltiazem (CARDIZEM  CD) 180 MG 24 hr capsule Take 1 capsule (180 mg total) by mouth in the morning and at bedtime. 180 capsule 3  . diphenhydrAMINE (BENADRYL) 25 MG tablet Take 50 mg by mouth at bedtime as needed.     Marland Kitchen EPINEPHrine 0.3 mg/0.3 mL IJ SOAJ injection SMARTSIG:0.3 Milligram(s) IM Once    . esomeprazole (NEXIUM) 40 MG capsule Take 1 capsule (40 mg total) by mouth 2 (two) times daily before a meal. 180 capsule 3  . Ferrous Sulfate (SLOW FE PO) Take by mouth.    . folic acid (FOLVITE) 1 MG tablet Take 2 mg by mouth daily.     Marland Kitchen levocetirizine (XYZAL) 5 MG tablet Take 1 tablet (5 mg total) by mouth every evening. 90 tablet 3  . levothyroxine (SYNTHROID, LEVOTHROID) 75 MCG tablet Take 75  mcg by mouth daily before breakfast.    . methotrexate 2.5 MG tablet 2.5 mg. Takes 4 tablets weekly    . montelukast (SINGULAIR) 10 MG tablet Take 1 tablet (10 mg total) by mouth at bedtime. 90 tablet 3  . Multiple Vitamin (MULTIVITAMIN WITH MINERALS) TABS tablet Take 1 tablet daily by mouth.    . Multiple Vitamins-Minerals (OCUVITE PRESERVISION PO) Take 1 tablet 2 (two) times daily by mouth.    . rosuvastatin (CRESTOR) 5 MG tablet Take 1 tablet (5 mg total) by mouth daily. 90 tablet 2   Current Facility-Administered Medications  Medication Dose Route Frequency Provider Last Rate Last Admin  . Benralizumab SOSY 30 mg  30 mg Subcutaneous Q8 Weeks Rexene Alberts M, DO   30 mg at 05/21/20 1057    Allergies  Allergen Reactions  . Penicillins Hives    Has patient had a PCN reaction causing immediate rash, facial/tongue/throat swelling, SOB or lightheadedness with hypotension: No Has patient had a PCN reaction causing severe rash involving mucus membranes or skin necrosis: No Has patient had a PCN reaction that required hospitalization: No Has patient had a PCN reaction occurring within the last 10 years: Yes If all of the above answers are "NO", then may proceed with Cephalosporin use.   . Sulfa Antibiotics Other (See Comments)    Unknown to patient   . Latex Hives and Rash    Social History   Socioeconomic History  . Marital status: Married    Spouse name: [name]  . Number of children: 2  . Years of education: Not on file  . Highest education level: Not on file  Occupational History  . Occupation: RETIRED  Tobacco Use  . Smoking status: Former Smoker    Packs/day: 0.50    Years: 4.00    Pack years: 2.00    Types: Cigarettes    Quit date: 02/28/1961    Years since quitting: 59.3  . Smokeless tobacco: Never Used  Vaping Use  . Vaping Use: Never used  Substance and Sexual Activity  . Alcohol use: Yes    Alcohol/week: 2.0 - 3.0 standard drinks    Types: 2 - 3 Standard drinks or  equivalent per week  . Drug use: No  . Sexual activity: Not on file  Other Topics Concern  . Not on file  Social History Narrative   Pt recently moved to [place] from [place] to be near his son.   Retired.   Social Determinants of Health   Financial Resource Strain: Not on file  Food Insecurity: Not on file  Transportation Needs: Not on file  Physical Activity: Not on file  Stress: Not on file  Social Connections: Not on file  Intimate Partner Violence: Not on file    Family History  Problem Relation Age of Onset  . Asthma Mother 13  . Allergies Mother   . Ulcers Mother   . CVA Father 12  . Heart Problems Father   . Other Father        NEUROLOGIC ISSUES   . Other Brother 11       BICYCLE ACCIDENT  . Migraines Son   . Diabetes Mellitus I Son     ROS- All systems are reviewed and negative except as per the HPI above  Physical Exam: There were no vitals filed for this visit. Wt Readings from Last 3 Encounters:  05/05/20 71.7 kg  04/21/20 70.3 kg  03/06/20 69.7 kg    Labs: Lab Results  Component Value Date   NA 139 09/16/2019   K 3.7 09/16/2019   CL 105 09/16/2019   CO2 24 09/16/2019   GLUCOSE 120 (H) 09/16/2019   BUN 18 09/16/2019   CREATININE 1.36 (H) 09/16/2019   CALCIUM 9.4 09/16/2019   No results found for: INR No results found for: CHOL, HDL, LDLCALC, TRIG   GEN- The patient is a well appearing elderly male, alert and oriented x 3 today.   HEENT-head normocephalic, atraumatic, sclera clear, conjunctiva pink, hearing intact, trachea midline. Lungs- Clear to ausculation bilaterally, normal work of breathing Heart- irregular rate and rhythm, no rubs or gallops. 2/6 systolic murmur   GI- soft, NT, ND, + BS Extremities- no clubbing, cyanosis, or edema MS- no significant deformity or atrophy Skin- no rash or lesion Psych- euthymic mood, full affect Neuro- strength and sensation are intact   EKG- afib, RBBB, PVC Vent. rate 82 BPM PR interval *  ms QRS duration 160 ms QT/QTcB 418/488 ms  Epic records reviewed  Echo 12/20/18 1. Left ventricular ejection fraction, by visual estimation, is 60 to  65%. The left ventricle has normal function. There is no left ventricular  hypertrophy.  2. Global right ventricle has normal systolic function.The right  ventricular size is normal. No increase in right ventricular wall  thickness.  3. Left atrial size was normal.  4. Right atrial size was normal.  5. The mitral valve is normal in structure. Trace mitral valve  regurgitation.  6. The tricuspid valve is normal in structure. Tricuspid valve  regurgitation is mild.  7. The aortic valve is tricuspid Aortic valve regurgitation is trivial by  color flow Doppler. Moderate aortic valve stenosis.  8. There is Moderate calcification of the aortic valve.  9. There is Moderate thickening of the aortic valve.  10. The pulmonic valve was grossly normal. Pulmonic valve regurgitation is not visualized by color flow Doppler.  11. Normal pulmonary artery systolic pressure.  12. The tricuspid regurgitant velocity is 2.40 m/s, and with an assumed  right atrial pressure of 3 mmHg, the estimated right ventricular systolic  pressure is normal at 25.9 mmHg.  13. The inferior vena cava is normal in size with greater than 50%  respiratory variability, suggesting right atrial pressure of 3 mmHg.    Assessment and Plan: 1. Permanent atrial fibrillation/typical atrial flutter Patient in rate controlled asymptomatic afib. Check cmet/cbc. He is due for lipid panel with PCP. 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   3. Aortic Stenosis Moderate by Echo 12/20/18. Repeat at next visit.    Follow up in the AF clinic in one year.    [facility] [address] [place], [place] [address] [phone]

## 2020-06-29 DIAGNOSIS — Z961 Presence of intraocular lens: Secondary | ICD-10-CM | POA: Diagnosis not present

## 2020-06-29 DIAGNOSIS — H0102A Squamous blepharitis right eye, upper and lower eyelids: Secondary | ICD-10-CM | POA: Diagnosis not present

## 2020-06-29 DIAGNOSIS — H00022 Hordeolum internum right lower eyelid: Secondary | ICD-10-CM | POA: Diagnosis not present

## 2020-06-29 DIAGNOSIS — H0102B Squamous blepharitis left eye, upper and lower eyelids: Secondary | ICD-10-CM | POA: Diagnosis not present

## 2020-06-29 DIAGNOSIS — H2511 Age-related nuclear cataract, right eye: Secondary | ICD-10-CM | POA: Diagnosis not present

## 2020-07-09 ENCOUNTER — Encounter (INDEPENDENT_AMBULATORY_CARE_PROVIDER_SITE_OTHER): Payer: Medicare Other | Admitting: Ophthalmology

## 2020-07-13 ENCOUNTER — Encounter (INDEPENDENT_AMBULATORY_CARE_PROVIDER_SITE_OTHER): Payer: Medicare Other | Admitting: Ophthalmology

## 2020-07-14 ENCOUNTER — Ambulatory Visit: Payer: Medicare Other | Admitting: Allergy and Immunology

## 2020-07-14 DIAGNOSIS — E7800 Pure hypercholesterolemia, unspecified: Secondary | ICD-10-CM | POA: Diagnosis not present

## 2020-07-14 DIAGNOSIS — Z125 Encounter for screening for malignant neoplasm of prostate: Secondary | ICD-10-CM | POA: Diagnosis not present

## 2020-07-14 DIAGNOSIS — M8589 Other specified disorders of bone density and structure, multiple sites: Secondary | ICD-10-CM | POA: Diagnosis not present

## 2020-07-14 DIAGNOSIS — M859 Disorder of bone density and structure, unspecified: Secondary | ICD-10-CM | POA: Diagnosis not present

## 2020-07-14 DIAGNOSIS — Z0000 Encounter for general adult medical examination without abnormal findings: Secondary | ICD-10-CM | POA: Diagnosis not present

## 2020-07-14 DIAGNOSIS — J4550 Severe persistent asthma, uncomplicated: Secondary | ICD-10-CM | POA: Diagnosis not present

## 2020-07-15 ENCOUNTER — Other Ambulatory Visit: Payer: Self-pay

## 2020-07-15 ENCOUNTER — Ambulatory Visit (INDEPENDENT_AMBULATORY_CARE_PROVIDER_SITE_OTHER): Payer: Medicare Other

## 2020-07-15 DIAGNOSIS — J4550 Severe persistent asthma, uncomplicated: Secondary | ICD-10-CM | POA: Diagnosis not present

## 2020-07-16 DIAGNOSIS — L905 Scar conditions and fibrosis of skin: Secondary | ICD-10-CM | POA: Diagnosis not present

## 2020-07-16 DIAGNOSIS — L578 Other skin changes due to chronic exposure to nonionizing radiation: Secondary | ICD-10-CM | POA: Diagnosis not present

## 2020-07-16 DIAGNOSIS — D225 Melanocytic nevi of trunk: Secondary | ICD-10-CM | POA: Diagnosis not present

## 2020-07-16 DIAGNOSIS — Z85820 Personal history of malignant melanoma of skin: Secondary | ICD-10-CM | POA: Diagnosis not present

## 2020-07-16 DIAGNOSIS — L570 Actinic keratosis: Secondary | ICD-10-CM | POA: Diagnosis not present

## 2020-07-16 DIAGNOSIS — Z85828 Personal history of other malignant neoplasm of skin: Secondary | ICD-10-CM | POA: Diagnosis not present

## 2020-07-16 DIAGNOSIS — D485 Neoplasm of uncertain behavior of skin: Secondary | ICD-10-CM | POA: Diagnosis not present

## 2020-07-16 DIAGNOSIS — C44799 Other specified malignant neoplasm of skin of left lower limb, including hip: Secondary | ICD-10-CM | POA: Diagnosis not present

## 2020-07-16 DIAGNOSIS — C44612 Basal cell carcinoma of skin of right upper limb, including shoulder: Secondary | ICD-10-CM | POA: Diagnosis not present

## 2020-07-20 ENCOUNTER — Other Ambulatory Visit: Payer: Self-pay

## 2020-07-20 ENCOUNTER — Encounter (INDEPENDENT_AMBULATORY_CARE_PROVIDER_SITE_OTHER): Payer: Self-pay | Admitting: Ophthalmology

## 2020-07-20 ENCOUNTER — Ambulatory Visit (INDEPENDENT_AMBULATORY_CARE_PROVIDER_SITE_OTHER): Payer: Medicare Other | Admitting: Ophthalmology

## 2020-07-20 ENCOUNTER — Encounter (INDEPENDENT_AMBULATORY_CARE_PROVIDER_SITE_OTHER): Payer: Medicare Other | Admitting: Ophthalmology

## 2020-07-20 ENCOUNTER — Other Ambulatory Visit (INDEPENDENT_AMBULATORY_CARE_PROVIDER_SITE_OTHER): Payer: Self-pay | Admitting: Ophthalmology

## 2020-07-20 DIAGNOSIS — H353211 Exudative age-related macular degeneration, right eye, with active choroidal neovascularization: Secondary | ICD-10-CM | POA: Diagnosis not present

## 2020-07-20 DIAGNOSIS — H353132 Nonexudative age-related macular degeneration, bilateral, intermediate dry stage: Secondary | ICD-10-CM | POA: Diagnosis not present

## 2020-07-20 DIAGNOSIS — H59039 Cystoid macular edema following cataract surgery, unspecified eye: Secondary | ICD-10-CM | POA: Diagnosis not present

## 2020-07-20 DIAGNOSIS — C44709 Unspecified malignant neoplasm of skin of left lower limb, including hip: Secondary | ICD-10-CM | POA: Diagnosis not present

## 2020-07-20 DIAGNOSIS — H35371 Puckering of macula, right eye: Secondary | ICD-10-CM

## 2020-07-20 MED ORDER — RANIBIZUMAB 0.5 MG/0.05ML IZ SOSY
0.5000 mg | PREFILLED_SYRINGE | INTRAVITREAL | Status: AC | PRN
  Administered 2020-07-20: .5 mg via INTRAVITREAL

## 2020-07-20 MED ORDER — BROMFENAC SODIUM 0.07 % OP SOLN: 1.0000 [drp] | Freq: Two times a day (BID) | OPHTHALMIC | 12 refills | Status: DC

## 2020-07-20 NOTE — Assessment & Plan Note (Signed)
CNVM OD, repeat injection intravitreal Lucentis today

## 2020-07-20 NOTE — Assessment & Plan Note (Signed)
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

## 2020-07-20 NOTE — Assessment & Plan Note (Signed)
As of January 2022, CME had nearly resolved some 6 to 7 months post YAG vitreal lysis considered that this was [employer] syndrome.  At that time topical NSAIDs were discontinued.  Since that time, CME has steadily increased in extent.  He has been using Prolensa once daily since March,  I will asked the patient to resume Prolensa twice daily for 6 weeks and then return visit as scheduled in approximately 8 weeks for evaluation and determination of further CME resolution has occurred  This does suggest that there may be ongoing anterior chamber and/or iris chafe from vitreous herniation and/or capsular iris adhesions that are occult and not visualized on clinical examination  Vitrectomy should not be undertaken for epiretinal membrane may actually help this condition if medical therapy is not successful resolution again

## 2020-07-20 NOTE — Assessment & Plan Note (Signed)
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

## 2020-07-20 NOTE — Progress Notes (Signed)
07/20/2020     CHIEF COMPLAINT Patient presents for Retina Follow Up (8 week fu OD and Lucentis OD/Pt states [facility] OU stable since last visit. Pt denies FOL, floaters, or ocular pain OU. /Pt reports using Prolensa Qday OD)   HISTORY OF PRESENT ILLNESS: Chad Morrison is a 87 y.o. male who presents to the clinic today for:   HPI    Retina Follow Up    Diagnosis: Wet AMD   Laterality: right eye   Onset: 8 weeks ago   Severity: mild   Duration: 8 weeks   Course: stable   Comments: 8 week fu OD and Lucentis OD Pt states VA OU stable since last visit. Pt denies FOL, floaters, or ocular pain OU.  Pt reports using Prolensa Qday OD       Last edited by Kendra Opitz, COA on 07/20/2020 10:15 AM. (History)      Referring physician: Haywood Pao, MD [place],  [place] [id number]  HISTORICAL INFORMATION:   Selected notes from the MEDICAL RECORD NUMBER       CURRENT MEDICATIONS: Current Outpatient Medications (Ophthalmic Drugs)  Medication Sig  . Bromfenac Sodium 0.07 % SOLN Place 1 drop into the right eye daily.   No current facility-administered medications for this visit. (Ophthalmic Drugs)   Current Outpatient Medications (Other)  Medication Sig  . albuterol (VENTOLIN HFA) 108 (90 Base) MCG/ACT inhaler USE 1 TO 2 INHALATIONS     ORALLY EVERY 6 HOURS AS    NEEDED FOR WHEEZING OR     SHORTNESS OF BREATH  . ALPRAZolam (XANAX) 0.25 MG tablet Take 0.25 mg by mouth at bedtime as needed for sleep.   Marland Kitchen apixaban (ELIQUIS) 5 MG TABS tablet Take 1 tablet (5 mg total) by mouth 2 (two) times daily.  . Ascorbic Acid (VITAMIN C PO) Take by mouth. Not sure the dosage  . azelastine (ASTELIN) 0.1 % nasal spray USE 1 SPRAY IN EACH NOSTRIL TWICE DAILY AS DIRECTED  . Benralizumab (FASENRA [place]) Inject into the skin. Every 8 weeks  . budesonide-formoterol (SYMBICORT) 160-4.5 MCG/ACT inhaler Inhale 2 puffs into the lungs 2 (two) times daily.  . Calcium Carbonate-Vitamin D  (CALCIUM-VITAMIN D) 600-125 MG-UNIT TABS Take 1 tablet by mouth 2 (two) times daily.   Marland Kitchen diltiazem (CARDIZEM CD) 180 MG 24 hr capsule Take 1 capsule (180 mg total) by mouth in the morning and at bedtime.  . diphenhydrAMINE (BENADRYL) 25 MG tablet Take 50 mg by mouth at bedtime as needed.   Marland Kitchen EPINEPHrine 0.3 mg/0.3 mL IJ SOAJ injection SMARTSIG:0.3 Milligram(s) IM Once  . esomeprazole (NEXIUM) 40 MG capsule Take 1 capsule (40 mg total) by mouth 2 (two) times daily before a meal.  . Ferrous Sulfate (SLOW FE PO) Take by mouth.  . levocetirizine (XYZAL) 5 MG tablet Take 1 tablet (5 mg total) by mouth every evening.  Marland Kitchen levothyroxine (SYNTHROID, LEVOTHROID) 75 MCG tablet Take 75 mcg by mouth daily before breakfast.  . montelukast (SINGULAIR) 10 MG tablet Take 1 tablet (10 mg total) by mouth at bedtime.  . Multiple Vitamin (MULTIVITAMIN WITH MINERALS) TABS tablet Take 1 tablet daily by mouth.  . Multiple Vitamins-Minerals (OCUVITE PRESERVISION PO) Take 1 tablet 2 (two) times daily by mouth.  . rosuvastatin (CRESTOR) 5 MG tablet Take 1 tablet (5 mg total) by mouth daily.   Current Facility-Administered Medications (Other)  Medication Route  . Benralizumab SOSY 30 mg Subcutaneous      REVIEW OF SYSTEMS:  ALLERGIES Allergies  Allergen Reactions  . Penicillins Hives    Has patient had a PCN reaction causing immediate rash, facial/tongue/throat swelling, SOB or lightheadedness with hypotension: No Has patient had a PCN reaction causing severe rash involving mucus membranes or skin necrosis: No Has patient had a PCN reaction that required hospitalization: No Has patient had a PCN reaction occurring within the last 10 years: Yes If all of the above answers are "NO", then may proceed with Cephalosporin use.   . Sulfa Antibiotics Other (See Comments)    Unknown to patient   . Latex Hives and Rash    PAST MEDICAL HISTORY Past Medical History:  Diagnosis Date  . Anxiety   . Asthma   .  Asthmatic bronchitis   . Atrial flutter (HCC)    chads2vasc score is 3  . Ehrlichiosis [id number]  . GERD (gastroesophageal reflux disease)   . Granulomatosis with polyangiitis ([place])   . HTN (hypertension)   . Hyperlipidemia   . Paroxysmal atrial fibrillation (HCC)   . Prostate cancer ([place])   . Seasonal allergies    Past Surgical History:  Procedure Laterality Date  . BASAL CELL CARCINOMA EXCISION  2013   EYELID  . HERNIA REPAIR Bilateral 2000, 2002  . KNEE ARTHROSCOPY  2000  . prostectomy  1995   RADICAL  . TONSILLECTOMY  1940    FAMILY HISTORY Family History  Problem Relation Age of Onset  . Asthma Mother 63  . Allergies Mother   . Ulcers Mother   . CVA Father 58  . Heart Problems Father   . Other Father        NEUROLOGIC ISSUES   . Other Brother 11       BICYCLE ACCIDENT  . Migraines Son   . Diabetes Mellitus I Son     SOCIAL HISTORY Social History   Tobacco Use  . Smoking status: Former Smoker    Packs/day: 0.50    Years: 4.00    Pack years: 2.00    Types: Cigarettes    Quit date: 02/28/1961    Years since quitting: 59.4  . Smokeless tobacco: Never Used  Vaping Use  . Vaping Use: Never used  Substance Use Topics  . Alcohol use: Yes    Alcohol/week: 2.0 - 3.0 standard drinks    Types: 2 - 3 Standard drinks or equivalent per week  . Drug use: No         OPHTHALMIC EXAM: Base Eye Exam    Visual Acuity (ETDRS)      Right Left   Dist [place] 20/40 -2 20/40 +2   Dist ph [place] 20/30 -2 NI   Correction: Glasses       Tonometry (Tonopen, 10:19 AM)      Right Left   Pressure 15 14       Pupils      Dark Light Shape React APD   Right 5 4 Round Slow None   Left 4 3 Round Slow None       Visual Fields (Counting fingers)      Left Right    Full Full       Extraocular Movement      Right Left    Full Full       Neuro/Psych    Oriented x3: Yes   Mood/Affect: Normal       Dilation    Right eye: 1.0% Mydriacyl, 2.5% Phenylephrine @ 10:19 AM         Slit  Lamp and Fundus Exam    External Exam      Right Left   External Normal Normal       Slit Lamp Exam      Right Left   Lids/Lashes Normal Normal   Conjunctiva/Sclera White and quiet White and quiet   Cornea Clear Clear   Anterior Chamber Deep and quiet has not, no remaining vitreous strand to the cornea Deep and quiet   Iris Round and reactive Round and reactive   Lens Posterior chamber intraocular lens 3+ Nuclear sclerosis   Anterior Vitreous Normal Normal       Fundus Exam      Right Left   Posterior Vitreous Posterior vitreous detachment    Disc Normal    C/D Ratio 0.65    Macula Normal, subfoveal drusenoid, no obvious CME    Vessels Normal    Periphery Normal           IMAGING AND PROCEDURES  Imaging and Procedures for 07/20/20  OCT, Retina - OU - Both Eyes       Right Eye Quality was good. Scan locations included subfoveal. Central Foveal Thickness: 449. Progression has improved. Findings include epiretinal membrane, cystoid macular edema, retinal drusen .   Left Eye Quality was good. Scan locations included subfoveal. Central Foveal Thickness: 349. Progression has been stable. Findings include abnormal foveal contour, retinal drusen .   Notes OD, much improved CME now, now some 12  months post YAG vitreal lysis Potentially decreasing the risk and ongoing Irvine Gass syndrome.  Nonetheless is also less subretinal fluid as a component of the wet ARMD.  We are treating that component now and yet we will extend interval examination next       Intravitreal Injection, Pharmacologic Agent - OD - Right Eye       Time Out 07/20/2020. 11:06 AM. Confirmed correct patient, procedure, site, and patient consented.   Procedure Injection:  0.5 mg Ranibizumab SOSY (LUCENTIS) 0.5mg /0.55ml prefilled syr   NDC: [id number], Lot: [id number]   Route: Intravitreal, Site: Right Eye, Waste: 0 mg  Post-op Post injection exam found visual acuity of at least counting  fingers. The patient tolerated the procedure well. There were no complications. The patient received written and verbal post procedure care education. Post injection medications were not given.                 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  At that time topical NSAIDs were discontinued.  Since that time, CME has steadily increased in extent.  He has been using Prolensa once daily since March,  I will asked the patient to resume Prolensa twice daily for 6 weeks and then return visit as scheduled in approximately 8 weeks for evaluation and determination of further CME resolution has occurred  This does suggest that there may be ongoing anterior chamber and/or iris chafe from vitreous herniation and/or capsular iris adhesions that are occult and not visualized on clinical examination  Vitrectomy should not be undertaken for epiretinal membrane may actually help this condition if medical therapy is not successful resolution again  Exudative age-related macular degeneration of right eye with active choroidal neovascularization (HCC) CNVM OD, repeat injection intravitreal Lucentis today  Right epiretinal membrane Epiretinal membrane not causative of CME As the findings on January 2022 disclosed CME at nearly resolved as of that time,  and then has recurred off of topical NSAIDs  Intermediate stage nonexudative age-related macular degeneration of both eyes Subfoveal drusen deposition patient continues on oral vitamin therapy, Ocuvite or equal substance      ICD-10-CM   1. 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 Right  epiretinal membrane  H35.371   4. Intermediate stage nonexudative age-related macular degeneration of both eyes  H35.3132     1.  CME has recurred OD, off of therapy initially and then now even on Prolensa once daily there is only a slight improvement in CME.  Will increase the dose of Prolensa to twice d daily  2.  We will continue to treat with intravitreal Lucentis OD as component of wet AMD possible RAP.  If CME should resolve the next step will be to maintain Prolensa topical use and to discontinue Lucentis.  3.  Ophthalmic Meds Ordered this visit:  Meds ordered this encounter  Medications  . Ranibizumab SOSY 0.5 mg       Return in about 8 weeks (around 09/14/2020) for dilate, OD, LUCENTIS 0.5 OCT.  There are no Patient Instructions on file for this visit.   Explained the diagnoses, plan, and follow up with the patient and they expressed understanding.  Patient expressed understanding of the importance of proper follow up care.   Clent Demark Masyn Rostro M.D. Diseases & Surgery of the Retina and Vitreous [facility] [place] 07/20/20     Abbreviations: M myopia (nearsighted); A astigmatism; H hyperopia (farsighted); P presbyopia; Mrx spectacle prescription;  CTL contact lenses; OD right eye; OS left eye; OU both eyes  XT exotropia; ET esotropia; PEK punctate epithelial keratitis; PEE punctate epithelial erosions; DES dry eye syndrome; MGD meibomian gland dysfunction; ATs artificial tears; PFAT's preservative free artificial tears; [place] nuclear sclerotic cataract; PSC posterior subcapsular cataract; ERM epi-retinal membrane; PVD posterior vitreous detachment; RD retinal detachment; DM diabetes mellitus; DR diabetic retinopathy; NPDR non-proliferative diabetic retinopathy; PDR proliferative diabetic retinopathy; CSME clinically significant macular edema; DME diabetic macular edema; dbh dot blot hemorrhages; CWS cotton wool spot; POAG primary open angle glaucoma; C/D cup-to-disc  ratio; HVF humphrey visual field; GVF goldmann visual field; OCT optical coherence tomography; IOP intraocular pressure; BRVO Branch retinal vein occlusion; CRVO central retinal vein occlusion; CRAO central retinal artery occlusion; BRAO branch retinal artery occlusion; RT retinal tear; SB scleral buckle; PPV pars plana vitrectomy; VH Vitreous hemorrhage; PRP panretinal laser photocoagulation; IVK intravitreal kenalog; VMT vitreomacular traction; MH Macular hole;  NVD neovascularization of the disc; NVE neovascularization elsewhere; AREDS age related eye disease study; ARMD age related macular degeneration; POAG primary open angle glaucoma; EBMD epithelial/anterior basement membrane dystrophy; ACIOL anterior chamber intraocular lens; IOL intraocular lens; PCIOL posterior chamber intraocular lens; Phaco/IOL phacoemulsification with intraocular lens placement; [place] photorefractive keratectomy; LASIK laser assisted in situ keratomileusis; HTN hypertension; DM diabetes mellitus; COPD chronic obstructive pulmonary disease

## 2020-07-21 DIAGNOSIS — G4700 Insomnia, unspecified: Secondary | ICD-10-CM | POA: Diagnosis not present

## 2020-07-21 DIAGNOSIS — M8580 Other specified disorders of bone density and structure, unspecified site: Secondary | ICD-10-CM | POA: Diagnosis not present

## 2020-07-21 DIAGNOSIS — I700 Atherosclerosis of aorta: Secondary | ICD-10-CM | POA: Diagnosis not present

## 2020-07-21 DIAGNOSIS — M3130 Wegener's granulomatosis without renal involvement: Secondary | ICD-10-CM | POA: Diagnosis not present

## 2020-07-21 DIAGNOSIS — Z1212 Encounter for screening for malignant neoplasm of rectum: Secondary | ICD-10-CM | POA: Diagnosis not present

## 2020-07-21 DIAGNOSIS — Z1389 Encounter for screening for other disorder: Secondary | ICD-10-CM | POA: Diagnosis not present

## 2020-07-21 DIAGNOSIS — D8989 Other specified disorders involving the immune mechanism, not elsewhere classified: Secondary | ICD-10-CM | POA: Diagnosis not present

## 2020-07-21 DIAGNOSIS — Z8546 Personal history of malignant neoplasm of prostate: Secondary | ICD-10-CM | POA: Diagnosis not present

## 2020-07-21 DIAGNOSIS — Z1331 Encounter for screening for depression: Secondary | ICD-10-CM | POA: Diagnosis not present

## 2020-07-21 DIAGNOSIS — Z23 Encounter for immunization: Secondary | ICD-10-CM | POA: Diagnosis not present

## 2020-07-21 DIAGNOSIS — R82998 Other abnormal findings in urine: Secondary | ICD-10-CM | POA: Diagnosis not present

## 2020-07-21 DIAGNOSIS — Z0000 Encounter for general adult medical examination without abnormal findings: Secondary | ICD-10-CM | POA: Diagnosis not present

## 2020-07-21 DIAGNOSIS — R946 Abnormal results of thyroid function studies: Secondary | ICD-10-CM | POA: Diagnosis not present

## 2020-07-21 DIAGNOSIS — E7800 Pure hypercholesterolemia, unspecified: Secondary | ICD-10-CM | POA: Diagnosis not present

## 2020-07-21 DIAGNOSIS — Z7901 Long term (current) use of anticoagulants: Secondary | ICD-10-CM | POA: Diagnosis not present

## 2020-07-21 DIAGNOSIS — I4892 Unspecified atrial flutter: Secondary | ICD-10-CM | POA: Diagnosis not present

## 2020-07-21 DIAGNOSIS — J849 Interstitial pulmonary disease, unspecified: Secondary | ICD-10-CM | POA: Diagnosis not present

## 2020-08-04 ENCOUNTER — Encounter: Payer: Self-pay | Admitting: Allergy and Immunology

## 2020-08-04 ENCOUNTER — Other Ambulatory Visit: Payer: Self-pay

## 2020-08-04 ENCOUNTER — Ambulatory Visit (INDEPENDENT_AMBULATORY_CARE_PROVIDER_SITE_OTHER): Payer: Medicare Other | Admitting: Allergy and Immunology

## 2020-08-04 VITALS — BP 110/68 | HR 75 | Temp 97.8°F | Resp 16 | Ht 69.0 in | Wt 157.4 lb

## 2020-08-04 DIAGNOSIS — R768 Other specified abnormal immunological findings in serum: Secondary | ICD-10-CM | POA: Diagnosis not present

## 2020-08-04 DIAGNOSIS — D801 Nonfamilial hypogammaglobulinemia: Secondary | ICD-10-CM | POA: Diagnosis not present

## 2020-08-04 DIAGNOSIS — K219 Gastro-esophageal reflux disease without esophagitis: Secondary | ICD-10-CM

## 2020-08-04 DIAGNOSIS — J3089 Other allergic rhinitis: Secondary | ICD-10-CM | POA: Diagnosis not present

## 2020-08-04 DIAGNOSIS — J324 Chronic pansinusitis: Secondary | ICD-10-CM | POA: Diagnosis not present

## 2020-08-04 DIAGNOSIS — J3489 Other specified disorders of nose and nasal sinuses: Secondary | ICD-10-CM

## 2020-08-04 DIAGNOSIS — M3130 Wegener's granulomatosis without renal involvement: Secondary | ICD-10-CM | POA: Diagnosis not present

## 2020-08-04 DIAGNOSIS — J4550 Severe persistent asthma, uncomplicated: Secondary | ICD-10-CM

## 2020-08-04 NOTE — Patient Instructions (Addendum)
  1.  Continue to treat and prevent inflammation:   A. Symbicort 160 - 2 inhalations 2 times per day    B. Montelukast 10 mg - 1 tablet 1 time per day  D. Benralizumab injections    2.  Continue to treat and prevent reflux:   A. Minimize caffeine consumption   B. Nexium 40 mg - 2 times per day  3. If needed:   A. albuterol HFA - 2 inhalations every 4-6 hours  B. OTC Antihistamine  4. Check Pneumo 23 serotiters, IgA/G/M, spep with reflex to immunofixation, Covid 19 semiquantitative IgG antibodies    5. Return to clinic in 6 months or earlier if problem  6. Paxlovid if infected with COVID

## 2020-08-04 NOTE — Progress Notes (Signed)
[name] [facility] [place] [facility] [place]   Follow-up Note  Referring Provider: Tisovec, Fransico Him, MD  Primary Provider: Haywood Pao, MD Date of Office Visit: 08/04/2020  Subjective:   Chad Morrison (DOB: Jun 14, 1932) is a 87 y.o. male who returns to the [facility] on 08/04/2020 in re-evaluation of the following:  HPI: Chad Morrison returns to this clinic in reevaluation of asthma with eosinophilic phenotype treated with benralizumab, chronic sinusitis, LPR, history of Wegener's granulomatosis treated with methotrexate followed at [facility] rheumatology, and history of IgG hypogammaglobulinemia with elevated IgM.  His last visit to this clinic was 21 April 2020.  He is doing wonderful with his airway.  He has no coughing and wheezing and does not use a short acting bronchodilator and can exert himself to a significant extent still though he is a little bit slower than his wife when they go walking.  He has not required a systemic steroid or an antibiotic for any type of airway issue.  He continues on benralizumab, Symbicort, montelukast, and uses nasal saline washes.  He has had no issues with reflux while using Nexium twice a day.  He is now off his methotrexate as directed by his [facility] rheumatologist for his Wegener's granulomatosis.  He did receive a Pneumovax and possibly a tetanus vaccination.  He has received 3 COVID vaccines.  Allergies as of 08/04/2020      Reactions   Penicillins Hives   Has patient had a PCN reaction causing immediate rash, facial/tongue/throat swelling, SOB or lightheadedness with hypotension: No Has patient had a PCN reaction causing severe rash involving mucus membranes or skin necrosis: No Has patient had a PCN reaction that required hospitalization: No Has patient had a PCN reaction occurring within the last 10 years: Yes If all of the above answers are "NO", then may proceed with Cephalosporin use.   Sulfa Antibiotics Other  (See Comments)   Unknown to patient   Latex Hives, Rash      Medication List      albuterol 108 (90 Base) MCG/ACT inhaler Commonly known as: VENTOLIN HFA USE 1 TO 2 INHALATIONS     ORALLY EVERY 6 HOURS AS    NEEDED FOR WHEEZING OR     SHORTNESS OF BREATH   ALPRAZolam 0.25 MG tablet Commonly known as: XANAX Take 0.25 mg by mouth at bedtime as needed for sleep.   apixaban 5 MG Tabs tablet Commonly known as: Eliquis Take 1 tablet (5 mg total) by mouth 2 (two) times daily.   azelastine 0.1 % nasal spray Commonly known as: ASTELIN USE 1 SPRAY IN EACH NOSTRIL TWICE DAILY AS DIRECTED   budesonide-formoterol 160-4.5 MCG/ACT inhaler Commonly known as: SYMBICORT Inhale 2 puffs into the lungs 2 (two) times daily.   Calcium-Vitamin D 600-125 MG-UNIT Tabs Take 1 tablet by mouth 2 (two) times daily.   diltiazem 180 MG 24 hr capsule Commonly known as: Cardizem CD Take 1 capsule (180 mg total) by mouth in the morning and at bedtime.   diphenhydrAMINE 25 MG tablet Commonly known as: BENADRYL Take 50 mg by mouth at bedtime as needed.   EPINEPHrine 0.3 mg/0.3 mL Soaj injection Commonly known as: EPI-PEN SMARTSIG:0.3 Milligram(s) IM Once   esomeprazole 40 MG capsule Commonly known as: NexIUM Take 1 capsule (40 mg total) by mouth 2 (two) times daily before a meal.   FASENRA [place] Inject into the skin. Every 8 weeks   levocetirizine 5 MG tablet Commonly known as:  XYZAL Take 1 tablet (5 mg total) by mouth every evening.   levothyroxine 75 MCG tablet Commonly known as: SYNTHROID Take 75 mcg by mouth daily before breakfast.   montelukast 10 MG tablet Commonly known as: SINGULAIR Take 1 tablet (10 mg total) by mouth at bedtime.   multivitamin with minerals Tabs tablet Take 1 tablet daily by mouth.   OCUVITE PRESERVISION PO Take 1 tablet 2 (two) times daily by mouth.   Prolensa 0.07 % Soln Generic drug: Bromfenac Sodium PLACE 1 DROP INTO THE RIGHT EYE IN THE MORNING AND AT  BEDTIME.   rosuvastatin 5 MG tablet Commonly known as: CRESTOR Take 1 tablet (5 mg total) by mouth daily.   SLOW FE PO Take by mouth.   VITAMIN C PO Take by mouth. Not sure the dosage       Past Medical History:  Diagnosis Date  . Anxiety   . Asthma   . Asthmatic bronchitis   . Atrial flutter (HCC)    chads2vasc score is 3  . Ehrlichiosis 5852  . GERD (gastroesophageal reflux disease)   . Granulomatosis with polyangiitis ([place])   . HTN (hypertension)   . Hyperlipidemia   . Paroxysmal atrial fibrillation (HCC)   . Prostate cancer ([place])   . Seasonal allergies     Past Surgical History:  Procedure Laterality Date  . BASAL CELL CARCINOMA EXCISION  2013   EYELID  . HERNIA REPAIR Bilateral 2000, 2002  . KNEE ARTHROSCOPY  2000  . prostectomy  1995   RADICAL  . TONSILLECTOMY  1940    Review of systems negative except as noted in HPI / PMHx or noted below:  Review of Systems  Constitutional: Negative.   HENT: Negative.   Eyes: Negative.   Respiratory: Negative.   Cardiovascular: Negative.   Gastrointestinal: Negative.   Genitourinary: Negative.   Musculoskeletal: Negative.   Skin: Negative.   Neurological: Negative.   Endo/Heme/Allergies: Negative.   Psychiatric/Behavioral: Negative.      Objective:   Vitals:   08/04/20 1125  BP: 110/68  Pulse: 75  Resp: 16  Temp: 97.8 F (36.6 C)  SpO2: 98%   Height: 5\' 9"  (175.3 cm)  Weight: 157 lb 6.4 oz (71.4 kg)   Physical Exam Constitutional:      Appearance: He is not diaphoretic.  HENT:     Head: Normocephalic.     Right Ear: Tympanic membrane, ear canal and external ear normal.     Left Ear: Tympanic membrane, ear canal and external ear normal.     Nose: Nose normal. No mucosal edema (Septal perforation with clear borders) or rhinorrhea.     Mouth/Throat:     Pharynx: Uvula midline. No oropharyngeal exudate.  Eyes:     Conjunctiva/sclera: Conjunctivae normal.  Neck:     Thyroid: No thyromegaly.      Trachea: Trachea normal. No tracheal tenderness or tracheal deviation.  Cardiovascular:     Rate and Rhythm: Normal rate and regular rhythm.     Heart sounds: Normal heart sounds, S1 normal and S2 normal. No murmur heard.   Pulmonary:     Effort: No respiratory distress.     Breath sounds: Normal breath sounds. No stridor. No wheezing or rales.  Lymphadenopathy:     Head:     Right side of head: No tonsillar adenopathy.     Left side of head: No tonsillar adenopathy.     Cervical: No cervical adenopathy.  Skin:    Findings: No erythema or  rash.     Nails: There is no clubbing.  Neurological:     Mental Status: He is alert.      Diagnostics:    Spirometry was performed and demonstrated an FEV1 of 2.45 at 99 % of predicted.  The patient had an Asthma Control Test with the following results: ACT Total Score: 20.    Results of blood tests performed 14 Jul 2020 identifies tetanus antitoxoid IgG antibody 0.44U/mL which is protective, diphtheria antitoxin antibody less than 0.10U/mL, WBC 5.86, 34.4% lymphocytes, 0% eosinophils, hemoglobin 12.5, platelet 152  Assessment and Plan:   1. Hypogammaglobulinemia ([place])   2. High total serum IgM   3. 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 Asthma, severe persistent, well-controlled   5. Chronic pansinusitis   6. Perennial allergic rhinitis   7. LPRD (laryngopharyngeal reflux disease)   8. Nasal septal perforation     1.  Continue to treat and prevent inflammation:   A. Symbicort 160 - 2 inhalations 2 times per day    B. Montelukast 10 mg - 1 tablet 1 time per day  D. Benralizumab injections    2.  Continue to treat and prevent reflux:   A. Minimize caffeine consumption   B. Nexium 40 mg - 2 times per day  3. If needed:   A. albuterol HFA - 2 inhalations every 4-6 hours  B. OTC Antihistamine  4. Check Pneumo 23 serotiters, IgA/G/M, spep with reflex to immunofixation, Covid 19 semiquantitative  IgG antibodies    5. Return to clinic in 6 months or earlier if problem  6. Paxlovid if infected with COVID  Romie Minus appears to be doing very well.  We will now work through his history of hypogammaglobulinemia to see if he can respond to specific immune agents including his Pneumovax and his COVID vaccinations and also further investigate his elevated IgM.  I will see him back in this clinic in 6 months while he continues on a collection of anti-inflammatory agents for his airway including benralizumab and continues to address his reflux disease with proton pump inhibitor twice a day  Allena Katz, MD Allergy / [place]

## 2020-08-05 ENCOUNTER — Encounter: Payer: Self-pay | Admitting: Allergy and Immunology

## 2020-08-13 LAB — STREP PNEUMONIAE 23 SEROTYPES IGG
Pneumo Ab Type 1*: >14.2 ug/mL (ref >1.3)
Pneumo Ab Type 12 (12F)*: 2 ug/mL (ref >1.3)
Pneumo Ab Type 14*: >18.7 ug/mL (ref >1.3)
Pneumo Ab Type 17 (17F)*: 0.1 ug/mL — ABNORMAL LOW (ref >1.3)
Pneumo Ab Type 19 (19F)*: 12.7 ug/mL (ref >1.3)
Pneumo Ab Type 2*: 1.1 ug/mL — ABNORMAL LOW (ref >1.3)
Pneumo Ab Type 20*: 3.4 ug/mL (ref >1.3)
Pneumo Ab Type 22 (22F)*: 2.6 ug/mL (ref >1.3)
Pneumo Ab Type 23 (23F)*: 3.6 ug/mL (ref >1.3)
Pneumo Ab Type 26 (6B)*: 4.6 ug/mL (ref >1.3)
Pneumo Ab Type 3*: 5.5 ug/mL (ref >1.3)
Pneumo Ab Type 34 (10A)*: >19.5 ug/mL (ref >1.3)
Pneumo Ab Type 4*: 3.4 ug/mL (ref >1.3)
Pneumo Ab Type 43 (11A)*: >7.6 ug/mL (ref >1.3)
Pneumo Ab Type 5*: 32.8 ug/mL (ref >1.3)
Pneumo Ab Type 51 (7F)*: 1.3 ug/mL — ABNORMAL LOW (ref >1.3)
Pneumo Ab Type 54 (15B)*: 20.4 ug/mL (ref >1.3)
Pneumo Ab Type 56 (18C)*: >8.1 ug/mL (ref >1.3)
Pneumo Ab Type 57 (19A)*: 22.9 ug/mL (ref >1.3)
Pneumo Ab Type 68 (9V)*: >13.4 ug/mL (ref >1.3)
Pneumo Ab Type 70 (33F)*: 6.1 ug/mL (ref >1.3)
Pneumo Ab Type 8*: 3.1 ug/mL (ref >1.3)
Pneumo Ab Type 9 (9N)*: 1.2 ug/mL — ABNORMAL LOW (ref >1.3)

## 2020-08-13 LAB — PE (RFX IFE+FLC), S
A/G Ratio: 1.4 (ref 0.7–1.7)
Albumin ELP: 3.7 g/dL (ref 2.9–4.4)
Alpha 1: 0.3 g/dL (ref 0.0–0.4)
Alpha 2: 0.6 g/dL (ref 0.4–1.0)
Beta: 0.8 g/dL (ref 0.7–1.3)
Gamma Globulin: 1.1 g/dL (ref 0.4–1.8)
Globulin, Total: 2.7 g/dL (ref 2.2–3.9)
IFE Reflex: 0
M-Spike, %: 0.4 g/dL — ABNORMAL HIGH
Total Protein: 6.4 g/dL (ref 6.0–8.5)

## 2020-08-13 LAB — SAR COV2 SEROLOGY (COVID19)AB(IGG),IA
SARS-CoV-2 Semi-Quant IgG Ab: 798 AU/mL (ref <13.0)
SARS-CoV-2 Spike Ab Interp: POSITIVE

## 2020-08-13 LAB — IGG, IGA, IGM
IgA/Immunoglobulin A, Serum: 74 mg/dL (ref 61–437)
IgG (Immunoglobin G), Serum: 536 mg/dL — ABNORMAL LOW (ref 603–1613)
IgM (Immunoglobulin M), Srm: 980 mg/dL — ABNORMAL HIGH (ref 15–143)

## 2020-08-13 LAB — IFE+KAPPA/LAMBDA QN S
Ig Kappa Free Light Chain: 37.2 mg/L — ABNORMAL HIGH (ref 3.3–19.4)
Ig Lambda Free Light Chain: 49.3 mg/L — ABNORMAL HIGH (ref 5.7–26.3)
KAPPA/LAMBDA RATIO: 0.75 (ref 0.26–1.65)

## 2020-08-17 ENCOUNTER — Telehealth: Payer: Self-pay

## 2020-08-17 NOTE — Telephone Encounter (Signed)
Called the patient to go over lab results. He verbalized understand of the results and that he is to follow up with a Hematologist to follow his M-spike levels. He has no further questions or concerns at this time.

## 2020-08-20 DIAGNOSIS — C44719 Basal cell carcinoma of skin of left lower limb, including hip: Secondary | ICD-10-CM | POA: Diagnosis not present

## 2020-08-20 DIAGNOSIS — C44612 Basal cell carcinoma of skin of right upper limb, including shoulder: Secondary | ICD-10-CM | POA: Diagnosis not present

## 2020-08-21 ENCOUNTER — Encounter: Payer: Self-pay | Admitting: Allergy and Immunology

## 2020-08-21 ENCOUNTER — Encounter: Payer: Self-pay | Admitting: *Deleted

## 2020-09-01 DIAGNOSIS — T462X5S Adverse effect of other antidysrhythmic drugs, sequela: Secondary | ICD-10-CM | POA: Diagnosis not present

## 2020-09-01 DIAGNOSIS — E032 Hypothyroidism due to medicaments and other exogenous substances: Secondary | ICD-10-CM | POA: Diagnosis not present

## 2020-09-01 DIAGNOSIS — E039 Hypothyroidism, unspecified: Secondary | ICD-10-CM | POA: Diagnosis not present

## 2020-09-03 DIAGNOSIS — M159 Polyosteoarthritis, unspecified: Secondary | ICD-10-CM | POA: Diagnosis not present

## 2020-09-03 DIAGNOSIS — Z79899 Other long term (current) drug therapy: Secondary | ICD-10-CM | POA: Diagnosis not present

## 2020-09-03 DIAGNOSIS — M3130 Wegener's granulomatosis without renal involvement: Secondary | ICD-10-CM | POA: Diagnosis not present

## 2020-09-03 DIAGNOSIS — M8589 Other specified disorders of bone density and structure, multiple sites: Secondary | ICD-10-CM | POA: Diagnosis not present

## 2020-09-04 DIAGNOSIS — C44719 Basal cell carcinoma of skin of left lower limb, including hip: Secondary | ICD-10-CM | POA: Diagnosis not present

## 2020-09-08 DIAGNOSIS — J4550 Severe persistent asthma, uncomplicated: Secondary | ICD-10-CM | POA: Diagnosis not present

## 2020-09-09 ENCOUNTER — Ambulatory Visit (INDEPENDENT_AMBULATORY_CARE_PROVIDER_SITE_OTHER): Payer: Medicare Other

## 2020-09-09 ENCOUNTER — Other Ambulatory Visit: Payer: Self-pay

## 2020-09-09 DIAGNOSIS — J4550 Severe persistent asthma, uncomplicated: Secondary | ICD-10-CM

## 2020-09-14 ENCOUNTER — Ambulatory Visit (INDEPENDENT_AMBULATORY_CARE_PROVIDER_SITE_OTHER): Payer: Medicare Other | Admitting: Ophthalmology

## 2020-09-14 ENCOUNTER — Encounter (INDEPENDENT_AMBULATORY_CARE_PROVIDER_SITE_OTHER): Payer: Self-pay | Admitting: Ophthalmology

## 2020-09-14 ENCOUNTER — Other Ambulatory Visit: Payer: Self-pay

## 2020-09-14 DIAGNOSIS — H353211 Exudative age-related macular degeneration, right eye, with active choroidal neovascularization: Secondary | ICD-10-CM

## 2020-09-14 DIAGNOSIS — H35351 Cystoid macular degeneration, right eye: Secondary | ICD-10-CM

## 2020-09-14 DIAGNOSIS — H35371 Puckering of macula, right eye: Secondary | ICD-10-CM

## 2020-09-14 DIAGNOSIS — H2512 Age-related nuclear cataract, left eye: Secondary | ICD-10-CM

## 2020-09-14 NOTE — Progress Notes (Signed)
09/14/2020     CHIEF COMPLAINT Patient presents for Retina Follow Up (8 week fu OD and Lucentis OD/Pt states [facility] OU stable since last visit. Pt denies FOL, floaters, or ocular pain OU. /Pt reports using Prolensa Qday OD, until 2 weeks prior to this examination and overall still with less CME.//)   HISTORY OF PRESENT ILLNESS: Chad Morrison is a 87 y.o. male who presents to the clinic today for:   HPI     Retina Follow Up           Diagnosis: Wet AMD   Laterality: right eye   Onset: 8 weeks ago   Severity: mild   Duration: 8 weeks   Course: stable   Comments: 8 week fu OD and Lucentis OD Pt states VA OU stable since last visit. Pt denies FOL, floaters, or ocular pain OU.  Pt reports using Prolensa Qday OD, until 2 weeks prior to this examination and overall still with less CME.           Comments   OD with much less CME and soft reasons for treating with Lucentis due to the presence of chronic CME OD.  Will actually not treat today and will continue to observe and asked patient to follow-up visit in shorter interval next to monitor the response to not treating.      Last edited by Hurman Horn, MD on 09/14/2020 11:12 AM.      Referring physician: Haywood Pao, MD [place],  [place] [id number]  HISTORICAL INFORMATION:   Selected notes from the [facility]: Current Outpatient Medications (Ophthalmic Drugs)  Medication Sig   PROLENSA 0.07 % SOLN PLACE 1 DROP INTO THE RIGHT EYE IN THE MORNING AND AT BEDTIME.   No current facility-administered medications for this visit. (Ophthalmic Drugs)   Current Outpatient Medications (Other)  Medication Sig   albuterol (VENTOLIN HFA) 108 (90 Base) MCG/ACT inhaler USE 1 TO 2 INHALATIONS     ORALLY EVERY 6 HOURS AS    NEEDED FOR WHEEZING OR     SHORTNESS OF BREATH   ALPRAZolam (XANAX) 0.25 MG tablet Take 0.25 mg by mouth at bedtime as needed for sleep.    apixaban (ELIQUIS)  5 MG TABS tablet Take 1 tablet (5 mg total) by mouth 2 (two) times daily.   Ascorbic Acid (VITAMIN C PO) Take by mouth. Not sure the dosage   azelastine (ASTELIN) 0.1 % nasal spray USE 1 SPRAY IN EACH NOSTRIL TWICE DAILY AS DIRECTED   Benralizumab (FASENRA Prentiss) Inject into the skin. Every 8 weeks   budesonide-formoterol (SYMBICORT) 160-4.5 MCG/ACT inhaler Inhale 2 puffs into the lungs 2 (two) times daily.   Calcium Carbonate-Vitamin D (CALCIUM-VITAMIN D) 600-125 MG-UNIT TABS Take 1 tablet by mouth 2 (two) times daily.    diltiazem (CARDIZEM CD) 180 MG 24 hr capsule Take 1 capsule (180 mg total) by mouth in the morning and at bedtime.   diphenhydrAMINE (BENADRYL) 25 MG tablet Take 50 mg by mouth at bedtime as needed.    EPINEPHrine 0.3 mg/0.3 mL IJ SOAJ injection SMARTSIG:0.3 Milligram(s) IM Once   esomeprazole (NEXIUM) 40 MG capsule Take 1 capsule (40 mg total) by mouth 2 (two) times daily before a meal.   Ferrous Sulfate (SLOW FE PO) Take by mouth.   levocetirizine (XYZAL) 5 MG tablet Take 1 tablet (5 mg total) by mouth every evening.   levothyroxine (SYNTHROID, LEVOTHROID) 75 MCG  tablet Take 75 mcg by mouth daily before breakfast.   montelukast (SINGULAIR) 10 MG tablet Take 1 tablet (10 mg total) by mouth at bedtime.   Multiple Vitamin (MULTIVITAMIN WITH MINERALS) TABS tablet Take 1 tablet daily by mouth.   Multiple Vitamins-Minerals (OCUVITE PRESERVISION PO) Take 1 tablet 2 (two) times daily by mouth.   rosuvastatin (CRESTOR) 5 MG tablet Take 1 tablet (5 mg total) by mouth daily.   Current Facility-Administered Medications (Other)  Medication Route   Benralizumab SOSY 30 mg Subcutaneous      REVIEW OF SYSTEMS:    ALLERGIES Allergies  Allergen Reactions   Penicillins Hives    Has patient had a PCN reaction causing immediate rash, facial/tongue/throat swelling, SOB or lightheadedness with hypotension: No Has patient had a PCN reaction causing severe rash involving mucus membranes or  skin necrosis: No Has patient had a PCN reaction that required hospitalization: No Has patient had a PCN reaction occurring within the last 10 years: Yes If all of the above answers are "NO", then may proceed with Cephalosporin use.    Sulfa Antibiotics Other (See Comments)    Unknown to patient    Latex Hives and Rash    PAST MEDICAL HISTORY Past Medical History:  Diagnosis Date   Anxiety    Asthma    Asthmatic bronchitis    Atrial flutter ([place])    chads2vasc score is 3   Ehrlichiosis 0865   GERD (gastroesophageal reflux disease)    Granulomatosis with polyangiitis (HCC)    HTN (hypertension)    Hyperlipidemia    Paroxysmal atrial fibrillation (HCC)    Prostate cancer (HCC)    Seasonal allergies    Past Surgical History:  Procedure Laterality Date   BASAL CELL CARCINOMA EXCISION  2013   EYELID   HERNIA REPAIR Bilateral 2000, 2002   KNEE ARTHROSCOPY  2000   prostectomy  1995   RADICAL   TONSILLECTOMY  1940    FAMILY HISTORY Family History  Problem Relation Age of Onset   Asthma Mother 60   Allergies Mother    Ulcers Mother    CVA Father 75   Heart Problems Father    Other Father        NEUROLOGIC ISSUES    Other Brother 80       BICYCLE ACCIDENT   Migraines Son    Diabetes Mellitus I Son     SOCIAL HISTORY Social History   Tobacco Use   Smoking status: Former    Packs/day: 0.50    Years: 4.00    Pack years: 2.00    Types: Cigarettes    Quit date: 02/28/1961    Years since quitting: 59.5   Smokeless tobacco: Never  Vaping Use   Vaping Use: Never used  Substance Use Topics   Alcohol use: Yes    Alcohol/week: 2.0 - 3.0 standard drinks    Types: 2 - 3 Standard drinks or equivalent per week   Drug use: No         OPHTHALMIC EXAM:  Base Eye Exam     Visual Acuity (ETDRS)       Right Left   Dist [place] 20/40 -2 20/60   Dist ph [place] NI 20/40 +1         Tonometry (Tonopen, 10:46 AM)       Right Left   Pressure 16 15         Pupils        Pupils Dark Light Shape React  APD   Right PERRL 5 4 Round Slow None   Left PERRL 4 3 Round Slow None         Visual Fields (Counting fingers)       Left Right    Full Full         Extraocular Movement       Right Left    Full Full         Neuro/Psych     Oriented x3: Yes   Mood/Affect: Normal         Dilation     Right eye: 1.0% Mydriacyl, 2.5% Phenylephrine @ 10:46 AM           Slit Lamp and Fundus Exam     External Exam       Right Left   External Normal Normal         Slit Lamp Exam       Right Left   Lids/Lashes Normal Normal   Conjunctiva/Sclera White and quiet White and quiet   Cornea Clear Clear   Anterior Chamber Deep and quiet has not, no remaining vitreous strand to the cornea Deep and quiet   Iris Round and reactive Round and reactive   Lens Posterior chamber intraocular lens 3+ Nuclear sclerosis   Anterior Vitreous Normal Normal         Fundus Exam       Right Left   Posterior Vitreous Posterior vitreous detachment    Disc Normal    C/D Ratio 0.65    Macula Normal, subfoveal drusenoid, no obvious CME    Vessels Normal    Periphery Normal             IMAGING AND PROCEDURES  Imaging and Procedures for 09/14/20  OCT, Retina - OU - Both Eyes       Right Eye Quality was good. Scan locations included subfoveal. Central Foveal Thickness: 425. Progression has improved. Findings include epiretinal membrane, cystoid macular edema, retinal drusen .   Left Eye Quality was good. Scan locations included subfoveal. Central Foveal Thickness: 349. Progression has been stable. Findings include abnormal foveal contour, retinal drusen .   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  Persistent epiretinal membrane  Nonetheless is also less subretinal fluid as a component of the wet ARMD.  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  Now that [employer] mechanical portion of the disease for CME triggering may have been resolving over time, will attempt empirically to halt use of Lucentis today and examination next in only 4 weeks to look for signs of recurrence.  Patient understands that this may indeed occur and if so we will resume Lucentis at that time  Nuclear sclerotic cataract of left eye Cataract(s) account for the  patient's complaint. I discussed the risks and benefits of cataract surgery. Options were explained to the patient. The patient understands that new glasses may not improve their vision and desires to have cataract surgery. I have recommended follow up with their general eye care doctor for evaluation and consideration of cataract extraction with new intraocular lens insertion.    Right epiretinal membrane The nature of macular pucker (epiretinal membrane ERM) was discussed with the patient as well as threshold criteria for vitrectomy surgery. I explained that in rare cases another surgery is needed to actually remove a second wrinkle should it regrow.  Most often, the epiretinal membrane and underlying wrinkled internal limiting membrane are removed with the first surgery, to accomplish the goals.   If the operative eye is Phakic (natural lens still present), cataract surgery is often recommended prior to Vitrectomy.  This will enable the retina surgeon to have the best view during surgery and the patient to obtain optimal results in the future. Treatment options were discussed.  OD, minor impact on CME well continue to monitor     ICD-10-CM   1. Exudative age-related macular degeneration of right eye with active choroidal neovascularization (HCC)  H35.3211 OCT, Retina - OU - Both Eyes    2. Cystoid macular edema of right eye  H35.351     3. Nuclear sclerotic cataract of left eye  H25.12     4. Right epiretinal membrane  H35.371       1.  At 8-week follow-up today, was planning on possible Lucentis 0.5 injection for wrap type like treatment of CME yet overall condition improved this month most likely is Irvine gas slowly resolving post YAG vitreal lysis some 14 months previous and with topical NSAID therapy.  Will attempt observation alone OD today with no injection today OD.  Continue on topical Prolensa OD once daily.  2.  I explained to the patient that cataract surgery is needed in the left eye to maximize visual potential.  He is exploring surgical options with another surgeon  3.  Ophthalmic Meds Ordered this visit:  No orders of the defined types were placed in this encounter.      Return in about 4 weeks (around 10/12/2020) for dilate, OD, OCT, LUCENTIS 0.5 OCT possible.  There are no Patient Instructions on file for this visit.   Explained the diagnoses, plan, and follow up with the patient and they expressed understanding.  Patient expressed understanding of the importance of proper follow up care.   Clent Demark Aryonna Gunnerson M.D. Diseases & Surgery of the Retina and Vitreous [facility] [place] 09/14/20     Abbreviations: M myopia (nearsighted); A astigmatism; H hyperopia (farsighted); P presbyopia; Mrx spectacle prescription;  CTL contact lenses; OD right eye; OS left eye; OU both eyes  XT exotropia; ET esotropia; PEK punctate epithelial keratitis; PEE punctate epithelial  erosions; DES dry eye syndrome; MGD meibomian gland dysfunction; ATs artificial tears; PFAT's preservative free artificial tears; [place] nuclear sclerotic cataract; PSC posterior subcapsular cataract; ERM epi-retinal membrane; PVD posterior vitreous detachment; RD retinal detachment; DM diabetes mellitus; DR diabetic retinopathy; NPDR non-proliferative diabetic retinopathy; PDR proliferative diabetic retinopathy; CSME clinically significant macular edema; DME diabetic macular edema; dbh dot blot hemorrhages; CWS cotton wool spot; POAG primary open angle glaucoma; C/D cup-to-disc ratio; HVF humphrey visual field; GVF goldmann visual field; OCT optical coherence tomography; IOP intraocular pressure; BRVO Branch retinal vein occlusion; CRVO central retinal vein occlusion; CRAO central retinal artery occlusion; BRAO branch retinal artery  occlusion; RT retinal tear; SB scleral buckle; PPV pars plana vitrectomy; VH Vitreous hemorrhage; PRP panretinal laser photocoagulation; IVK intravitreal kenalog; VMT vitreomacular traction; MH Macular hole;  NVD neovascularization of the disc; NVE neovascularization elsewhere; AREDS age related eye disease study; ARMD age related macular degeneration; POAG primary open angle glaucoma; EBMD epithelial/anterior basement membrane dystrophy; ACIOL anterior chamber intraocular lens; IOL intraocular lens; PCIOL posterior chamber intraocular lens; Phaco/IOL phacoemulsification with intraocular lens placement; [place] photorefractive keratectomy; LASIK laser assisted in situ keratomileusis; HTN hypertension; DM diabetes mellitus; COPD chronic obstructive pulmonary disease

## 2020-09-14 NOTE — Assessment & Plan Note (Signed)
Patient has used topical Prolensa twice daily now for the last 6 weeks and now for the most recent 2 weeks just read return to once daily on the Prolensa topically right eye  Patient asked and instructed to continue on once daily topical Prolensa right eye

## 2020-09-14 NOTE — Assessment & Plan Note (Signed)
Cataract(s) account for the patient's complaint. I discussed the risks and benefits of cataract surgery. Options were explained to the patient. The patient understands that new glasses may not improve their vision and desires to have cataract surgery. I have recommended follow up with their general eye care doctor for evaluation and consideration of cataract extraction with new intraocular lens insertion. 

## 2020-09-14 NOTE — Assessment & Plan Note (Signed)
The nature of macular pucker (epiretinal membrane ERM) was discussed with the patient as well as threshold criteria for vitrectomy surgery. I explained that in rare cases another surgery is needed to actually remove a second wrinkle should it regrow.  Most often, the epiretinal membrane and underlying wrinkled internal limiting membrane are removed with the first surgery, to accomplish the goals.   If the operative eye is Phakic (natural lens still present), cataract surgery is often recommended prior to Vitrectomy. This will enable the retina surgeon to have the best view during surgery and the patient to obtain optimal results in the future. Treatment options were discussed.  OD, minor impact on CME well continue to monitor

## 2020-09-14 NOTE — Assessment & Plan Note (Signed)
Chronic CME right eye treated as if this is RAP and controlled with intravitreal Lucentis most recently out to 8 weeks.  Now that [employer] mechanical portion of the disease for CME triggering may have been resolving over time, will attempt empirically to halt use of Lucentis today and examination next in only 4 weeks to look for signs of recurrence.  Patient understands that this may indeed occur and if so we will resume Lucentis at that time

## 2020-09-23 DIAGNOSIS — Z23 Encounter for immunization: Secondary | ICD-10-CM | POA: Diagnosis not present

## 2020-10-07 ENCOUNTER — Ambulatory Visit: Payer: Self-pay

## 2020-10-12 ENCOUNTER — Other Ambulatory Visit: Payer: Self-pay

## 2020-10-12 ENCOUNTER — Encounter (INDEPENDENT_AMBULATORY_CARE_PROVIDER_SITE_OTHER): Payer: Self-pay | Admitting: Ophthalmology

## 2020-10-12 ENCOUNTER — Ambulatory Visit (INDEPENDENT_AMBULATORY_CARE_PROVIDER_SITE_OTHER): Payer: Medicare Other | Admitting: Ophthalmology

## 2020-10-12 DIAGNOSIS — H353211 Exudative age-related macular degeneration, right eye, with active choroidal neovascularization: Secondary | ICD-10-CM | POA: Diagnosis not present

## 2020-10-12 DIAGNOSIS — H35371 Puckering of macula, right eye: Secondary | ICD-10-CM

## 2020-10-12 DIAGNOSIS — H353132 Nonexudative age-related macular degeneration, bilateral, intermediate dry stage: Secondary | ICD-10-CM | POA: Diagnosis not present

## 2020-10-12 DIAGNOSIS — H353212 Exudative age-related macular degeneration, right eye, with inactive choroidal neovascularization: Secondary | ICD-10-CM

## 2020-10-12 DIAGNOSIS — H2512 Age-related nuclear cataract, left eye: Secondary | ICD-10-CM

## 2020-10-12 DIAGNOSIS — H4301 Vitreous prolapse, right eye: Secondary | ICD-10-CM | POA: Diagnosis not present

## 2020-10-12 DIAGNOSIS — H35351 Cystoid macular degeneration, right eye: Secondary | ICD-10-CM

## 2020-10-12 NOTE — Progress Notes (Signed)
10/12/2020     CHIEF COMPLAINT Patient presents for Retina Follow Up (8 week fu OD and Lucentis OD/Pt states VA OU stable since last visit. Pt denies FOL, floaters, or ocular pain OU. /Pt reports using Prolensa Qday OD, until 2 weeks prior to this examination and overall still with less CME.//)   HISTORY OF PRESENT ILLNESS: Chad Morrison is a 87 y.o. male who presents to the clinic today for:   HPI     Retina Follow Up           Diagnosis: Wet AMD   Laterality: right eye   Onset: 12 weeks ago   Severity: mild   Duration: 12 weeks   Course: stable   Comments: 8 week fu OD and Lucentis OD Pt states VA OU stable since last visit. Pt denies FOL, floaters, or ocular pain OU.  Pt reports using Prolensa Qday OD, until 2 weeks prior to this examination and overall still with less CME.           Comments   4 week fu OD OCT lucentis OD, last inj OD 12 weeks ago 07/20/2020. Pt states he uses Prolensa OD qd.  Patient states vision is stable and unchanged since last visit. Denies any new floaters or FOL.       Last edited by Laurin Coder, COA on 10/12/2020 10:36 AM.      Referring physician: Haywood Pao, MD [place],  [place] [address]  HISTORICAL INFORMATION:   Selected notes from the [facility]: Current Outpatient Medications (Ophthalmic Drugs)  Medication Sig   PROLENSA 0.07 % SOLN PLACE 1 DROP INTO THE RIGHT EYE IN THE MORNING AND AT BEDTIME.   No current facility-administered medications for this visit. (Ophthalmic Drugs)   Current Outpatient Medications (Other)  Medication Sig   albuterol (VENTOLIN HFA) 108 (90 Base) MCG/ACT inhaler USE 1 TO 2 INHALATIONS     ORALLY EVERY 6 HOURS AS    NEEDED FOR WHEEZING OR     SHORTNESS OF BREATH   ALPRAZolam (XANAX) 0.25 MG tablet Take 0.25 mg by mouth at bedtime as needed for sleep.    apixaban (ELIQUIS) 5 MG TABS tablet Take 1 tablet (5 mg total) by mouth 2 (two)  times daily.   Ascorbic Acid (VITAMIN C PO) Take by mouth. Not sure the dosage   azelastine (ASTELIN) 0.1 % nasal spray USE 1 SPRAY IN EACH NOSTRIL TWICE DAILY AS DIRECTED   Benralizumab (FASENRA [place]) Inject into the skin. Every 8 weeks   budesonide-formoterol (SYMBICORT) 160-4.5 MCG/ACT inhaler Inhale 2 puffs into the lungs 2 (two) times daily.   Calcium Carbonate-Vitamin D (CALCIUM-VITAMIN D) 600-125 MG-UNIT TABS Take 1 tablet by mouth 2 (two) times daily.    diltiazem (CARDIZEM CD) 180 MG 24 hr capsule Take 1 capsule (180 mg total) by mouth in the morning and at bedtime.   diphenhydrAMINE (BENADRYL) 25 MG tablet Take 50 mg by mouth at bedtime as needed.    EPINEPHrine 0.3 mg/0.3 mL IJ SOAJ injection SMARTSIG:0.3 Milligram(s) IM Once   esomeprazole (NEXIUM) 40 MG capsule Take 1 capsule (40 mg total) by mouth 2 (two) times daily before a meal.   Ferrous Sulfate (SLOW FE PO) Take by mouth.   levocetirizine (XYZAL) 5 MG tablet Take 1 tablet (5 mg total) by mouth every evening.   levothyroxine (SYNTHROID, LEVOTHROID) 75 MCG tablet Take 75 mcg by mouth daily before breakfast.  montelukast (SINGULAIR) 10 MG tablet Take 1 tablet (10 mg total) by mouth at bedtime.   Multiple Vitamin (MULTIVITAMIN WITH MINERALS) TABS tablet Take 1 tablet daily by mouth.   Multiple Vitamins-Minerals (OCUVITE PRESERVISION PO) Take 1 tablet 2 (two) times daily by mouth.   rosuvastatin (CRESTOR) 5 MG tablet Take 1 tablet (5 mg total) by mouth daily.   Current Facility-Administered Medications (Other)  Medication Route   Benralizumab SOSY 30 mg Subcutaneous      REVIEW OF SYSTEMS:    ALLERGIES Allergies  Allergen Reactions   Penicillins Hives    Has patient had a PCN reaction causing immediate rash, facial/tongue/throat swelling, SOB or lightheadedness with hypotension: No Has patient had a PCN reaction causing severe rash involving mucus membranes or skin necrosis: No Has patient had a PCN reaction that  required hospitalization: No Has patient had a PCN reaction occurring within the last 10 years: Yes If all of the above answers are "NO", then may proceed with Cephalosporin use.    Sulfa Antibiotics Other (See Comments)    Unknown to patient    Latex Hives and Rash    PAST MEDICAL HISTORY Past Medical History:  Diagnosis Date   Anxiety    Asthma    Asthmatic bronchitis    Atrial flutter ([place])    chads2vasc score is 3   Ehrlichiosis [id number]   GERD (gastroesophageal reflux disease)    Granulomatosis with polyangiitis (HCC)    HTN (hypertension)    Hyperlipidemia    Paroxysmal atrial fibrillation (HCC)    Prostate cancer (HCC)    Seasonal allergies    Past Surgical History:  Procedure Laterality Date   BASAL CELL CARCINOMA EXCISION  2013   EYELID   HERNIA REPAIR Bilateral 2000, 2002   KNEE ARTHROSCOPY  2000   prostectomy  1995   RADICAL   TONSILLECTOMY  1940    FAMILY HISTORY Family History  Problem Relation Age of Onset   Asthma Mother 32   Allergies Mother    Ulcers Mother    CVA Father 17   Heart Problems Father    Other Father        NEUROLOGIC ISSUES    Other Brother 51       BICYCLE ACCIDENT   Migraines Son    Diabetes Mellitus I Son     SOCIAL HISTORY Social History   Tobacco Use   Smoking status: Former    Packs/day: 0.50    Years: 4.00    Pack years: 2.00    Types: Cigarettes    Quit date: 02/28/1961    Years since quitting: 59.6   Smokeless tobacco: Never  Vaping Use   Vaping Use: Never used  Substance Use Topics   Alcohol use: Yes    Alcohol/week: 2.0 - 3.0 standard drinks    Types: 2 - 3 Standard drinks or equivalent per week   Drug use: No         OPHTHALMIC EXAM:  Base Eye Exam     Visual Acuity (ETDRS)       Right Left   Dist [place] 20/30 -2 20/40 -1   Dist ph [place] NI          Tonometry (Tonopen, 10:41 AM)       Right Left   Pressure 12 11         Pupils       Pupils Dark Light Shape React APD   Right PERRL 5 4  Round Slow None  Left PERRL 5 4 Round Slow None         Visual Fields (Counting fingers)       Left Right    Full Full         Extraocular Movement       Right Left    Full Full         Neuro/Psych     Oriented x3: Yes   Mood/Affect: Normal         Dilation     Right eye: 1.0% Mydriacyl, 2.5% Phenylephrine @ 10:41 AM           Slit Lamp and Fundus Exam     External Exam       Right Left   External Normal Normal         Slit Lamp Exam       Right Left   Lids/Lashes Normal Normal   Conjunctiva/Sclera White and quiet White and quiet   Cornea Clear Clear   Anterior Chamber Deep and quiet has not, no remaining vitreous strand to the cornea Deep and quiet   Iris Round and reactive Round and reactive   Lens Posterior chamber intraocular lens 3+ Nuclear sclerosis   Anterior Vitreous Normal Normal         Fundus Exam       Right Left   Posterior Vitreous Posterior vitreous detachment    Disc Normal    C/D Ratio 0.65    Macula Normal, subfoveal drusenoid, no obvious CME    Vessels Normal    Periphery Normal             IMAGING AND PROCEDURES  Imaging and Procedures for 10/12/20  OCT, Retina - OU - Both Eyes       Right Eye Quality was good. Scan locations included subfoveal. Central Foveal Thickness: 423. Progression has improved. Findings include epiretinal membrane, cystoid macular edema, retinal drusen .   Left Eye Quality was good. Scan locations included subfoveal. Central Foveal Thickness: 349. Progression has been stable. Findings include abnormal foveal contour, retinal drusen .   Notes OD, much improved CME now, now some 16  months post YAG vitreal lysis Potentially decreasing the risk and ongoing Irvine Gass syndrome.  Persistent epiretinal membrane with large subfoveal drusenoid deposit, and less CME  Nonetheless is also less subretinal fluid as a component of the wet ARMD.  We are treating that component now and yet we  will extend interval examination next             ASSESSMENT/PLAN:  Nuclear sclerotic cataract of left eye Very dense nuclear sclerotic cataract left eye needs evaluation, scheduled to see Dr. [name] in the near future  Vitreous prolapse of right eye Much improved now almost 16 months post YAG vitreal lysis.  Right epiretinal membrane Possible moderate effect on CME and thickening OD with outer retinal change from the epiretinal membrane.  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  Also some 15 to 16 months post YAG vitreal lysis anterior vitreous strand and potential iris chafe still on topical NSAID.  Likely CNVM is inactive will observe  Intermediate stage nonexudative age-related macular degeneration of both eyes OU with subfoveal drusenoid deposit, large deposit centrally OD  ICD-10-CM   1. Exudative age-related macular degeneration of right eye with active choroidal neovascularization (HCC)  H35.3211 OCT, Retina - OU - Both Eyes    2. Nuclear sclerotic cataract of left eye  H25.12     3. Vitreous prolapse of right eye  H43.01     4. Right epiretinal membrane  H35.371     5. Cystoid macular edema of right eye  H35.351     6. Exudative age-related macular degeneration of right eye with inactive choroidal neovascularization ([place])  H35.3212     7. Intermediate stage nonexudative age-related macular degeneration of both eyes  H35.3132       1.  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  CME continues to improve off of intravitreal Lucentis now for 12 weeks, some 7 weeks  post therapeutic effect waning.  Thus follow-up next in 10 weeks and examine and potentially every 4 to 6 months thereafter  2.  Cataract OS, I agree patient needs surgery left eye scheduled to see Dr. [name] in the near future  3.  Ophthalmic Meds Ordered this visit:  No orders of the defined types were placed in this encounter.      Return in about 10 weeks (around 12/21/2020) for DILATE OU, OCT.  There are no Patient Instructions on file for this visit.   Explained the diagnoses, plan, and follow up with the patient and they expressed understanding.  Patient expressed understanding of the importance of proper follow up care.   Clent Demark Ramatoulaye Pack M.D. Diseases & Surgery of the Retina and Vitreous [facility]ina [facility] Diabetic [place] 10/12/20     Abbreviations: M myopia (nearsighted); A astigmatism; H hyperopia (farsighted); P presbyopia; Mrx spectacle prescription;  CTL contact lenses; OD right eye; OS left eye; OU both eyes  XT exotropia; ET esotropia; PEK punctate epithelial keratitis; PEE punctate epithelial erosions; DES dry eye syndrome; MGD meibomian gland dysfunction; ATs artificial tears; PFAT's preservative free artificial tears; [place] nuclear sclerotic cataract; PSC posterior subcapsular cataract; ERM epi-retinal membrane; PVD posterior vitreous detachment; RD retinal detachment; DM diabetes mellitus; DR diabetic retinopathy; NPDR non-proliferative diabetic retinopathy; PDR proliferative diabetic retinopathy; CSME clinically significant macular edema; DME diabetic macular edema; dbh dot blot hemorrhages; CWS cotton wool spot; POAG primary open angle glaucoma; C/D cup-to-disc ratio; HVF humphrey visual field; GVF goldmann visual field; OCT optical coherence tomography; IOP intraocular pressure; BRVO Branch retinal vein occlusion; CRVO central retinal vein occlusion; CRAO central retinal artery occlusion; BRAO branch retinal artery occlusion; RT retinal tear; SB scleral buckle; PPV pars plana  vitrectomy; VH Vitreous hemorrhage; PRP panretinal laser photocoagulation; IVK intravitreal kenalog; VMT vitreomacular traction; MH Macular hole;  NVD neovascularization of the disc; NVE neovascularization elsewhere; AREDS age related eye disease study; ARMD age related macular degeneration; POAG primary open angle glaucoma; EBMD epithelial/anterior basement membrane dystrophy; ACIOL anterior chamber intraocular lens; IOL intraocular lens; PCIOL posterior chamber intraocular lens; Phaco/IOL phacoemulsification with intraocular lens placement; [place] photorefractive keratectomy; LASIK laser assisted in situ keratomileusis; HTN hypertension; DM diabetes mellitus; COPD chronic obstructive pulmonary disease

## 2020-10-12 NOTE — Assessment & Plan Note (Signed)
Much improved now almost 16 months post YAG vitreal lysis.

## 2020-10-12 NOTE — Assessment & Plan Note (Signed)
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

## 2020-10-12 NOTE — Assessment & Plan Note (Signed)
OU with subfoveal drusenoid deposit, large deposit centrally OD

## 2020-10-12 NOTE — Assessment & Plan Note (Signed)
Now some 12 weeks post most recent injection antivegF (Lucentis), and CME continues to resolve.  Also some 15 to 16 months post YAG vitreal lysis anterior vitreous strand and potential iris chafe still on topical NSAID.  Likely CNVM is inactive will observe

## 2020-10-12 NOTE — Assessment & Plan Note (Signed)
Possible moderate effect on CME and thickening OD with outer retinal change from the epiretinal membrane.

## 2020-10-12 NOTE — Assessment & Plan Note (Signed)
Very dense nuclear sclerotic cataract left eye needs evaluation, scheduled to see Dr. [name] in the near future

## 2020-11-03 DIAGNOSIS — J4550 Severe persistent asthma, uncomplicated: Secondary | ICD-10-CM | POA: Diagnosis not present

## 2020-11-04 ENCOUNTER — Other Ambulatory Visit: Payer: Self-pay

## 2020-11-04 ENCOUNTER — Ambulatory Visit (INDEPENDENT_AMBULATORY_CARE_PROVIDER_SITE_OTHER): Payer: Medicare Other

## 2020-11-04 ENCOUNTER — Ambulatory Visit: Payer: Self-pay

## 2020-11-04 DIAGNOSIS — J4550 Severe persistent asthma, uncomplicated: Secondary | ICD-10-CM

## 2020-11-06 ENCOUNTER — Encounter (INDEPENDENT_AMBULATORY_CARE_PROVIDER_SITE_OTHER): Payer: Self-pay

## 2020-11-06 DIAGNOSIS — H2512 Age-related nuclear cataract, left eye: Secondary | ICD-10-CM | POA: Diagnosis not present

## 2020-11-06 DIAGNOSIS — H40013 Open angle with borderline findings, low risk, bilateral: Secondary | ICD-10-CM | POA: Diagnosis not present

## 2020-11-06 DIAGNOSIS — H353132 Nonexudative age-related macular degeneration, bilateral, intermediate dry stage: Secondary | ICD-10-CM | POA: Diagnosis not present

## 2020-11-06 DIAGNOSIS — H3581 Retinal edema: Secondary | ICD-10-CM | POA: Diagnosis not present

## 2020-11-06 DIAGNOSIS — H2513 Age-related nuclear cataract, bilateral: Secondary | ICD-10-CM | POA: Diagnosis not present

## 2020-11-06 DIAGNOSIS — H18413 Arcus senilis, bilateral: Secondary | ICD-10-CM | POA: Diagnosis not present

## 2020-11-06 DIAGNOSIS — H25013 Cortical age-related cataract, bilateral: Secondary | ICD-10-CM | POA: Diagnosis not present

## 2020-11-06 DIAGNOSIS — H35371 Puckering of macula, right eye: Secondary | ICD-10-CM | POA: Diagnosis not present

## 2020-11-23 DIAGNOSIS — J4540 Moderate persistent asthma, uncomplicated: Secondary | ICD-10-CM | POA: Diagnosis not present

## 2020-11-23 DIAGNOSIS — J302 Other seasonal allergic rhinitis: Secondary | ICD-10-CM | POA: Diagnosis not present

## 2020-11-23 DIAGNOSIS — R5383 Other fatigue: Secondary | ICD-10-CM | POA: Diagnosis not present

## 2020-11-23 DIAGNOSIS — Z1152 Encounter for screening for COVID-19: Secondary | ICD-10-CM | POA: Diagnosis not present

## 2020-11-23 DIAGNOSIS — J0100 Acute maxillary sinusitis, unspecified: Secondary | ICD-10-CM | POA: Diagnosis not present

## 2020-11-23 DIAGNOSIS — J849 Interstitial pulmonary disease, unspecified: Secondary | ICD-10-CM | POA: Diagnosis not present

## 2020-11-23 DIAGNOSIS — M3130 Wegener's granulomatosis without renal involvement: Secondary | ICD-10-CM | POA: Diagnosis not present

## 2020-11-24 ENCOUNTER — Encounter: Payer: Self-pay | Admitting: Pulmonary Disease

## 2020-11-24 ENCOUNTER — Ambulatory Visit (INDEPENDENT_AMBULATORY_CARE_PROVIDER_SITE_OTHER): Payer: Medicare Other | Admitting: Pulmonary Disease

## 2020-11-24 ENCOUNTER — Other Ambulatory Visit: Payer: Self-pay

## 2020-11-24 VITALS — BP 126/68 | HR 79 | Temp 97.5°F | Ht 69.0 in | Wt 152.6 lb

## 2020-11-24 DIAGNOSIS — J4550 Severe persistent asthma, uncomplicated: Secondary | ICD-10-CM | POA: Diagnosis not present

## 2020-11-24 DIAGNOSIS — R918 Other nonspecific abnormal finding of lung field: Secondary | ICD-10-CM | POA: Diagnosis not present

## 2020-11-24 NOTE — Patient Instructions (Signed)
I am glad you are doing well with your breathing Continue the Berna Bue Will order high-res CT in 6 months and follow-up in clinic.

## 2020-11-24 NOTE — Progress Notes (Signed)
Chad Morrison    [id number]    06/08/1932  Primary Care Physician:Tisovec, Fransico Him, MD  Referring Physician: Haywood Pao, MD [address] [place],  [place] [address]  Chief complaint: Follow-up for asthma  HPI: 87 year old with severe asthma, acid reflux, LPR, hypogammaglobilinemia, granulomatosis with polyangiitis, atrial fibrillation, aortic stenosis  Maintained on Symbicort inhaler. Evaluated by Dr. Neldon Mc from allergy and started on Fasenra in Oct 2021.   History notable for granulomatosis with polyangiitis.  He follows at [facility] for this.  Previously on methotrexate and prednisone.  His prednisone has been weaned off and methotrexate is coming down as well.  He feels that his asthma symptoms were under good control while he was on the prednisone but has flared up once he was offered.  Prednisone tapers usually helps him with his symptoms.  Continues on Nexium for GERD.  He was started on budesonide until nasal sprays and Nexium increased to twice a day by Dr. Neldon Mc but has been noncompliant with this change  Pets: No pets Occupation: Retired Financial planner Exposures: No known exposures.  No mold, hot tub, Jacuzzi.  No feather pillows or comforter Smoking history: 4-pack-year smoker.  Quit in 1963 Travel history: Originally from [place].  No significant recent travel Relevant family history: No significant family history of lung disease  Interim history: Continues on [place].  Breathing is doing well with no issues.  No new complaints today  Off methotrexate in March 2022 per his [facility]ologist  Outpatient Encounter Medications as of 11/24/2020  Medication Sig   albuterol (VENTOLIN HFA) 108 (90 Base) MCG/ACT inhaler USE 1 TO 2 INHALATIONS     ORALLY EVERY 6 HOURS AS    NEEDED FOR WHEEZING OR     SHORTNESS OF BREATH   ALPRAZolam (XANAX) 0.25 MG tablet Take 0.25 mg by mouth at bedtime as needed for sleep.    apixaban (ELIQUIS) 5 MG TABS tablet Take 1  tablet (5 mg total) by mouth 2 (two) times daily.   Ascorbic Acid (VITAMIN C PO) Take by mouth. Not sure the dosage   Benralizumab (FASENRA [place]) Inject into the skin. Every 8 weeks   budesonide-formoterol (SYMBICORT) 160-4.5 MCG/ACT inhaler Inhale 2 puffs into the lungs 2 (two) times daily.   Calcium Carbonate-Vitamin D (CALCIUM-VITAMIN D) 600-125 MG-UNIT TABS Take 1 tablet by mouth 2 (two) times daily.    diltiazem (CARDIZEM CD) 180 MG 24 hr capsule Take 1 capsule (180 mg total) by mouth in the morning and at bedtime.   diphenhydrAMINE (BENADRYL) 25 MG tablet Take 50 mg by mouth at bedtime as needed.    EPINEPHrine 0.3 mg/0.3 mL IJ SOAJ injection SMARTSIG:0.3 Milligram(s) IM Once   esomeprazole (NEXIUM) 40 MG capsule Take 1 capsule (40 mg total) by mouth 2 (two) times daily before a meal.   Ferrous Sulfate (SLOW FE PO) Take by mouth.   levocetirizine (XYZAL) 5 MG tablet Take 1 tablet (5 mg total) by mouth every evening.   levothyroxine (SYNTHROID, LEVOTHROID) 75 MCG tablet Take 75 mcg by mouth daily before breakfast.   montelukast (SINGULAIR) 10 MG tablet Take 1 tablet (10 mg total) by mouth at bedtime.   Multiple Vitamin (MULTIVITAMIN WITH MINERALS) TABS tablet Take 1 tablet daily by mouth.   Multiple Vitamins-Minerals (OCUVITE PRESERVISION PO) Take 1 tablet 2 (two) times daily by mouth.   PROLENSA 0.07 % SOLN PLACE 1 DROP INTO THE RIGHT EYE IN THE MORNING AND AT BEDTIME.  rosuvastatin (CRESTOR) 5 MG tablet Take 1 tablet (5 mg total) by mouth daily.   [DISCONTINUED] azelastine (ASTELIN) 0.1 % nasal spray USE 1 SPRAY IN EACH NOSTRIL TWICE DAILY AS DIRECTED   Facility-Administered Encounter Medications as of 11/24/2020  Medication   Benralizumab SOSY 30 mg   Physical Exam: Blood pressure 126/68, pulse 79, temperature (!) 97.5 F (36.4 C), temperature source Oral, height 5\' 9"  (1.753 m), weight 152 lb 9.6 oz (69.2 kg), SpO2 97 %. Gen:      No acute distress HEENT:  EOMI, sclera  anicteric Neck:     No masses; no thyromegaly Lungs:    Clear to auscultation bilaterally; normal respiratory effort CV:         Irregular, systolic murmur Abd:      + bowel sounds; soft, non-tender; no palpable masses, no distension Ext:    No edema; adequate peripheral perfusion Skin:      Warm and dry; no rash Neuro: alert and oriented x 3 Psych: normal mood and affect   Data Reviewed: Imaging: High-resolution CT 12/22/2017-mild bibasal groundglass with mild reticulation.  Pulmonary nodules. CT chest 05/06/2019-stable pulmonary nodules since 2019.  Scattered linear atelectasis and mild bronchiectasis Chest x-ray 03/06/2020-mild hyperinflation and interstitial prominence. I have reviewed the images personally.  PFTs: 05/24/2017 FVC 3.78 [96%], FEV1 2.44 [88%], F/F 65, TLC 7.41 [104%], DLCO 22.81 [90%] Moderate obstruction with air trapping and mild diffusion impairment  ACT score 12/07/2019- 19 ACT score 05/05/2020- 23 ACT score 11/24/2020-25  Labs: CBC 10/22/2019-WBC 5.7, eos 14%, absolute eosinophil count 798  Assessment:  Severe persistent asthma with eosinophilia Sinusitis Continue Symbicort inhaler and Fasenra with good response Follows with Dr. Tamsen Roers with polyangiitis On methotrexate per rheumatology His CT scan continues to show mild basal reticulation and groundglass which we will monitor  Plan/Recommendations: Continue Symbicort, Fasenra High-res CT in 6 months Follow-up in 6 months  Marshell Garfinkel MD [facility] 11/24/2020, 11:46 AM  CC: Tisovec, Fransico Him, MD

## 2020-11-26 DIAGNOSIS — Z7901 Long term (current) use of anticoagulants: Secondary | ICD-10-CM | POA: Diagnosis not present

## 2020-11-26 DIAGNOSIS — M3130 Wegener's granulomatosis without renal involvement: Secondary | ICD-10-CM | POA: Diagnosis not present

## 2020-11-26 DIAGNOSIS — J3489 Other specified disorders of nose and nasal sinuses: Secondary | ICD-10-CM | POA: Diagnosis not present

## 2020-11-26 DIAGNOSIS — Z87891 Personal history of nicotine dependence: Secondary | ICD-10-CM | POA: Diagnosis not present

## 2020-12-21 ENCOUNTER — Other Ambulatory Visit: Payer: Self-pay

## 2020-12-21 ENCOUNTER — Ambulatory Visit (INDEPENDENT_AMBULATORY_CARE_PROVIDER_SITE_OTHER): Payer: Medicare Other | Admitting: Ophthalmology

## 2020-12-21 ENCOUNTER — Encounter (INDEPENDENT_AMBULATORY_CARE_PROVIDER_SITE_OTHER): Payer: Self-pay | Admitting: Ophthalmology

## 2020-12-21 DIAGNOSIS — H35371 Puckering of macula, right eye: Secondary | ICD-10-CM | POA: Diagnosis not present

## 2020-12-21 DIAGNOSIS — H353132 Nonexudative age-related macular degeneration, bilateral, intermediate dry stage: Secondary | ICD-10-CM | POA: Diagnosis not present

## 2020-12-21 DIAGNOSIS — H35351 Cystoid macular degeneration, right eye: Secondary | ICD-10-CM | POA: Diagnosis not present

## 2020-12-21 DIAGNOSIS — H353211 Exudative age-related macular degeneration, right eye, with active choroidal neovascularization: Secondary | ICD-10-CM | POA: Diagnosis not present

## 2020-12-21 DIAGNOSIS — H353212 Exudative age-related macular degeneration, right eye, with inactive choroidal neovascularization: Secondary | ICD-10-CM

## 2020-12-21 DIAGNOSIS — H2512 Age-related nuclear cataract, left eye: Secondary | ICD-10-CM

## 2020-12-21 NOTE — Assessment & Plan Note (Signed)
No current CNVM OD.  Previous antivegF was delivered as a protection against wet AMD while the patient was fighting [employer] syndrome with secondary CME

## 2020-12-21 NOTE — Assessment & Plan Note (Signed)
I agree with proceeding with cataract surgery in the left eye to maximize visual acuity

## 2020-12-21 NOTE — Assessment & Plan Note (Signed)
Chronic perifoveal CME continues to improve on topical Prolensa, will continue

## 2020-12-21 NOTE — Assessment & Plan Note (Signed)
Minor Early to intermediate ARMD OS.  No current CNVM

## 2020-12-21 NOTE — Assessment & Plan Note (Signed)
Persistent epiretinal membrane with  Macular thickening as well as subfoveal debris at the photoreceptor, RPE interface,  from the visual process.  This could be from tangential traction of the epiretinal membrane

## 2020-12-21 NOTE — Progress Notes (Signed)
12/21/2020     CHIEF COMPLAINT Patient presents for  Chief Complaint  Patient presents with   Retina Follow Up      HISTORY OF PRESENT ILLNESS: Chad Morrison is a 87 y.o. male who presents to the clinic today for:   HPI     Retina Follow Up   Patient presents with  Wet AMD.  In both eyes.  This started 10 weeks ago.  Duration of 10 weeks.        Comments   10 week f/u OU with OCT OU      Last edited by Reather Littler, COA on 12/21/2020 10:05 AM.      Referring physician: Darleen Crocker, MD [address] [place],  [place] [address]  HISTORICAL INFORMATION:   Selected notes from the MEDICAL RECORD NUMBER       CURRENT MEDICATIONS: Current Outpatient Medications (Ophthalmic Drugs)  Medication Sig   PROLENSA 0.07 % SOLN PLACE 1 DROP INTO THE RIGHT EYE IN THE MORNING AND AT BEDTIME.   No current facility-administered medications for this visit. (Ophthalmic Drugs)   Current Outpatient Medications (Other)  Medication Sig   albuterol (VENTOLIN HFA) 108 (90 Base) MCG/ACT inhaler USE 1 TO 2 INHALATIONS     ORALLY EVERY 6 HOURS AS    NEEDED FOR WHEEZING OR     SHORTNESS OF BREATH   ALPRAZolam (XANAX) 0.25 MG tablet Take 0.25 mg by mouth at bedtime as needed for sleep.    apixaban (ELIQUIS) 5 MG TABS tablet Take 1 tablet (5 mg total) by mouth 2 (two) times daily.   Ascorbic Acid (VITAMIN C PO) Take by mouth. Not sure the dosage   Benralizumab (FASENRA [place]) Inject into the skin. Every 8 weeks   budesonide-formoterol (SYMBICORT) 160-4.5 MCG/ACT inhaler Inhale 2 puffs into the lungs 2 (two) times daily.   Calcium Carbonate-Vitamin D (CALCIUM-VITAMIN D) 600-125 MG-UNIT TABS Take 1 tablet by mouth 2 (two) times daily.    diltiazem (CARDIZEM CD) 180 MG 24 hr capsule Take 1 capsule (180 mg total) by mouth in the morning and at bedtime.   diphenhydrAMINE (BENADRYL) 25 MG tablet Take 50 mg by mouth at bedtime as needed.    EPINEPHrine 0.3 mg/0.3 mL IJ SOAJ injection  SMARTSIG:0.3 Milligram(s) IM Once   esomeprazole (NEXIUM) 40 MG capsule Take 1 capsule (40 mg total) by mouth 2 (two) times daily before a meal.   Ferrous Sulfate (SLOW FE PO) Take by mouth.   levocetirizine (XYZAL) 5 MG tablet Take 1 tablet (5 mg total) by mouth every evening.   levothyroxine (SYNTHROID, LEVOTHROID) 75 MCG tablet Take 75 mcg by mouth daily before breakfast.   montelukast (SINGULAIR) 10 MG tablet Take 1 tablet (10 mg total) by mouth at bedtime.   Multiple Vitamin (MULTIVITAMIN WITH MINERALS) TABS tablet Take 1 tablet daily by mouth.   Multiple Vitamins-Minerals (OCUVITE PRESERVISION PO) Take 1 tablet 2 (two) times daily by mouth.   rosuvastatin (CRESTOR) 5 MG tablet Take 1 tablet (5 mg total) by mouth daily.   Current Facility-Administered Medications (Other)  Medication Route   Benralizumab SOSY 30 mg Subcutaneous      REVIEW OF SYSTEMS:    ALLERGIES Allergies  Allergen Reactions   Penicillins Hives    Has patient had a PCN reaction causing immediate rash, facial/tongue/throat swelling, SOB or lightheadedness with hypotension: No Has patient had a PCN reaction causing severe rash involving mucus membranes or skin necrosis: No Has patient had a  PCN reaction that required hospitalization: No Has patient had a PCN reaction occurring within the last 10 years: Yes If all of the above answers are "NO", then may proceed with Cephalosporin use.    Sulfa Antibiotics Other (See Comments)    Unknown to patient    Latex Hives and Rash    PAST MEDICAL HISTORY Past Medical History:  Diagnosis Date   Anxiety    Asthma    Asthmatic bronchitis    Atrial flutter ([place])    chads2vasc score is 3   Ehrlichiosis 6578   GERD (gastroesophageal reflux disease)    Granulomatosis with polyangiitis (HCC)    HTN (hypertension)    Hyperlipidemia    Paroxysmal atrial fibrillation (HCC)    Prostate cancer (HCC)    Seasonal allergies    Past Surgical History:  Procedure  Laterality Date   BASAL CELL CARCINOMA EXCISION  2013   EYELID   HERNIA REPAIR Bilateral 2000, 2002   KNEE ARTHROSCOPY  2000   prostectomy  1995   RADICAL   TONSILLECTOMY  1940    FAMILY HISTORY Family History  Problem Relation Age of Onset   Asthma Mother 38   Allergies Mother    Ulcers Mother    CVA Father 57   Heart Problems Father    Other Father        NEUROLOGIC ISSUES    Other Brother 62       BICYCLE ACCIDENT   Migraines Son    Diabetes Mellitus I Son     SOCIAL HISTORY Social History   Tobacco Use   Smoking status: Former    Packs/day: 0.50    Years: 4.00    Pack years: 2.00    Types: Cigarettes    Quit date: 02/28/1961    Years since quitting: 59.8   Smokeless tobacco: Never  Vaping Use   Vaping Use: Never used  Substance Use Topics   Alcohol use: Yes    Alcohol/week: 2.0 - 3.0 standard drinks    Types: 2 - 3 Standard drinks or equivalent per week   Drug use: No         OPHTHALMIC EXAM:  Base Eye Exam     Visual Acuity (ETDRS)       Right Left   Dist [place] 20/30 20/32 -2   Dist ph   NI         Tonometry (Tonopen, 10:14 AM)       Right Left   Pressure 12 10         Pupils       Pupils Dark Light Shape React APD   Right PERRL 5 4 Round Slow None   Left PERRL 4.5 4 Round Slow None         Visual Fields (Counting fingers)       Left Right    Full Full         Extraocular Movement       Right Left    Full, Ortho Full, Ortho         Neuro/Psych     Oriented x3: Yes   Mood/Affect: Normal         Dilation     Both eyes: 1.0% Mydriacyl, 2.5% Phenylephrine @ 10:14 AM           Slit Lamp and Fundus Exam     External Exam       Right Left   External Normal Normal  Slit Lamp Exam       Right Left   Lids/Lashes Normal Normal   Conjunctiva/Sclera White and quiet White and quiet   Cornea Clear Clear   Anterior Chamber Deep and quiet has not, no remaining vitreous strand to the cornea Deep  and quiet   Iris Round and reactive Round and reactive   Lens Posterior chamber intraocular lens, open PC slightly decentered superotemporal, no vitreous prolapse around the IOL 3+ Nuclear sclerosis   Anterior Vitreous Normal Normal         Fundus Exam       Right Left   Posterior Vitreous Posterior vitreous detachment Normal   Disc Normal Normal   C/D Ratio 0.65 0.55   Macula Normal, subfoveal drusenoid, no obvious CME Hard drusen, Early age related macular degeneration   Vessels Normal Normal   Periphery Normal Normal            IMAGING AND PROCEDURES  Imaging and Procedures for 12/21/20  OCT, Retina - OU - Both Eyes       Right Eye Quality was good. Scan locations included subfoveal. Central Foveal Thickness: 417. Progression has improved. Findings include epiretinal membrane, cystoid macular edema, retinal drusen .   Left Eye Quality was good. Scan locations included subfoveal. Central Foveal Thickness: 337. Progression has been stable. Findings include abnormal foveal contour, retinal drusen .   Notes OD, much improved CME now, post YAG vitreal lysis for vitreous prolapse and good strand to the wound, potentially decreasing the risk of ongoing [place] syndrome.  Persistent epiretinal membrane with large subfoveal drusenoid deposit increasing in height as compared to January 2021 yet with less CME  Nonetheless is also less subretinal fluid as a component of the wet ARMD.  We are treating that component now and yet we will extend interval examination next               ASSESSMENT/PLAN:  Right epiretinal membrane Persistent epiretinal membrane with  Macular thickening as well as subfoveal debris at the photoreceptor, RPE interface,  from the visual process.  This could be from tangential traction of the epiretinal membrane  Nuclear sclerotic cataract of left eye I agree with proceeding with cataract surgery in the left eye to maximize visual  acuity  Intermediate stage nonexudative age-related macular degeneration of both eyes Minor Early to intermediate ARMD OS.  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 Exudative age-related macular degeneration of right eye with active choroidal neovascularization (HCC)  H35.3211 OCT, Retina - OU - Both Eyes    2. Right epiretinal membrane  H35.371     3. Nuclear sclerotic cataract of left eye  H25.12     4. Intermediate stage nonexudative age-related macular degeneration of both eyes  H35.3132     5. Exudative age-related macular degeneration of right eye with inactive choroidal neovascularization ([place])  H35.3212     6. Cystoid macular edema of right eye  H35.351       1.  OD, improving CME as the patient no longer compresses the eye unknowingly at night with a pillow as described by him in the past more  over the topical use of Prolensa OD once daily shall continue  2.  OD with large subfoveal drusenoid deposit seen in patients with vitreal macular adhesion and anterior posterior traction in order with tangential traction from epiretinal membrane elevating the central foveal deposit.  I have discussed with the patient the potential for surgical intervention in the right eye to remove the epiretinal membrane so as to allow for that large subfoveal deposit which is measurably increasing in size OD.  3.  OS with cataract proceed with surgery as scheduled  Ophthalmic Meds Ordered this visit:  No orders of the defined types were placed in this encounter.      Return in about 4 months (around 04/23/2021) for DILATE OU,  OCT.  There are no Patient Instructions on file for this visit.   Explained the diagnoses, plan, and follow up with the patient and they expressed understanding.  Patient expressed understanding of the importance of proper follow up care.   Clent Demark Caroleann Casler M.D. Diseases & Surgery of the Retina and Vitreous Retina & Diabetic [place] 12/21/20     Abbreviations: M myopia (nearsighted); A astigmatism; H hyperopia (farsighted); P presbyopia; Mrx spectacle prescription;  CTL contact lenses; OD right eye; OS left eye; OU both eyes  XT exotropia; ET esotropia; PEK punctate epithelial keratitis; PEE punctate epithelial erosions; DES dry eye syndrome; MGD meibomian gland dysfunction; ATs artificial tears; PFAT's preservative free artificial tears; [place] nuclear sclerotic cataract; PSC posterior subcapsular cataract; ERM epi-retinal membrane; PVD posterior vitreous detachment; RD retinal detachment; DM diabetes mellitus; DR diabetic retinopathy; NPDR non-proliferative diabetic retinopathy; PDR proliferative diabetic retinopathy; CSME clinically significant macular edema; DME diabetic macular edema; dbh dot blot hemorrhages; CWS cotton wool spot; POAG primary open angle glaucoma; C/D cup-to-disc ratio; HVF humphrey visual field; GVF goldmann visual field; OCT optical coherence tomography; IOP intraocular pressure; BRVO Branch retinal vein occlusion; CRVO central retinal vein occlusion; CRAO central retinal artery occlusion; BRAO branch retinal artery occlusion; RT retinal tear; SB scleral buckle; PPV pars plana vitrectomy; VH Vitreous hemorrhage; PRP panretinal laser photocoagulation; IVK intravitreal kenalog; VMT vitreomacular traction; MH Macular hole;  NVD neovascularization of the disc; NVE neovascularization elsewhere; AREDS age related eye disease study; ARMD age related macular degeneration; POAG primary open angle glaucoma; EBMD epithelial/anterior basement membrane dystrophy; ACIOL anterior chamber  intraocular lens; IOL intraocular lens; PCIOL posterior chamber intraocular lens; Phaco/IOL phacoemulsification with intraocular lens placement; [place] photorefractive keratectomy; LASIK laser assisted in situ keratomileusis; HTN hypertension; DM diabetes mellitus; COPD chronic obstructive pulmonary disease

## 2020-12-29 DIAGNOSIS — J4550 Severe persistent asthma, uncomplicated: Secondary | ICD-10-CM | POA: Diagnosis not present

## 2020-12-30 ENCOUNTER — Other Ambulatory Visit: Payer: Self-pay

## 2020-12-30 ENCOUNTER — Ambulatory Visit (INDEPENDENT_AMBULATORY_CARE_PROVIDER_SITE_OTHER): Payer: Medicare Other

## 2020-12-30 DIAGNOSIS — J4550 Severe persistent asthma, uncomplicated: Secondary | ICD-10-CM | POA: Diagnosis not present

## 2021-01-04 DIAGNOSIS — Z23 Encounter for immunization: Secondary | ICD-10-CM | POA: Diagnosis not present

## 2021-01-15 DIAGNOSIS — C44712 Basal cell carcinoma of skin of right lower limb, including hip: Secondary | ICD-10-CM | POA: Diagnosis not present

## 2021-01-15 DIAGNOSIS — C44519 Basal cell carcinoma of skin of other part of trunk: Secondary | ICD-10-CM | POA: Diagnosis not present

## 2021-01-15 DIAGNOSIS — Z85820 Personal history of malignant melanoma of skin: Secondary | ICD-10-CM | POA: Diagnosis not present

## 2021-01-15 DIAGNOSIS — L814 Other melanin hyperpigmentation: Secondary | ICD-10-CM | POA: Diagnosis not present

## 2021-01-15 DIAGNOSIS — L570 Actinic keratosis: Secondary | ICD-10-CM | POA: Diagnosis not present

## 2021-01-15 DIAGNOSIS — Z85828 Personal history of other malignant neoplasm of skin: Secondary | ICD-10-CM | POA: Diagnosis not present

## 2021-01-15 DIAGNOSIS — Z08 Encounter for follow-up examination after completed treatment for malignant neoplasm: Secondary | ICD-10-CM | POA: Diagnosis not present

## 2021-01-15 DIAGNOSIS — C44622 Squamous cell carcinoma of skin of right upper limb, including shoulder: Secondary | ICD-10-CM | POA: Diagnosis not present

## 2021-01-15 DIAGNOSIS — D492 Neoplasm of unspecified behavior of bone, soft tissue, and skin: Secondary | ICD-10-CM | POA: Diagnosis not present

## 2021-01-15 DIAGNOSIS — C44619 Basal cell carcinoma of skin of left upper limb, including shoulder: Secondary | ICD-10-CM | POA: Diagnosis not present

## 2021-01-15 DIAGNOSIS — D225 Melanocytic nevi of trunk: Secondary | ICD-10-CM | POA: Diagnosis not present

## 2021-01-15 DIAGNOSIS — L821 Other seborrheic keratosis: Secondary | ICD-10-CM | POA: Diagnosis not present

## 2021-01-15 DIAGNOSIS — L812 Freckles: Secondary | ICD-10-CM | POA: Diagnosis not present

## 2021-01-19 ENCOUNTER — Other Ambulatory Visit (HOSPITAL_COMMUNITY): Payer: Self-pay | Admitting: Physician Assistant

## 2021-01-25 DIAGNOSIS — J849 Interstitial pulmonary disease, unspecified: Secondary | ICD-10-CM | POA: Diagnosis not present

## 2021-01-25 DIAGNOSIS — D8989 Other specified disorders involving the immune mechanism, not elsewhere classified: Secondary | ICD-10-CM | POA: Diagnosis not present

## 2021-01-25 DIAGNOSIS — J4540 Moderate persistent asthma, uncomplicated: Secondary | ICD-10-CM | POA: Diagnosis not present

## 2021-01-25 DIAGNOSIS — M8580 Other specified disorders of bone density and structure, unspecified site: Secondary | ICD-10-CM | POA: Diagnosis not present

## 2021-01-25 DIAGNOSIS — I4892 Unspecified atrial flutter: Secondary | ICD-10-CM | POA: Diagnosis not present

## 2021-01-25 DIAGNOSIS — Z7901 Long term (current) use of anticoagulants: Secondary | ICD-10-CM | POA: Diagnosis not present

## 2021-01-25 DIAGNOSIS — N393 Stress incontinence (female) (male): Secondary | ICD-10-CM | POA: Diagnosis not present

## 2021-01-25 DIAGNOSIS — J302 Other seasonal allergic rhinitis: Secondary | ICD-10-CM | POA: Diagnosis not present

## 2021-01-25 DIAGNOSIS — M3130 Wegener's granulomatosis without renal involvement: Secondary | ICD-10-CM | POA: Diagnosis not present

## 2021-01-25 DIAGNOSIS — E7800 Pure hypercholesterolemia, unspecified: Secondary | ICD-10-CM | POA: Diagnosis not present

## 2021-01-25 DIAGNOSIS — I700 Atherosclerosis of aorta: Secondary | ICD-10-CM | POA: Diagnosis not present

## 2021-01-25 DIAGNOSIS — G4700 Insomnia, unspecified: Secondary | ICD-10-CM | POA: Diagnosis not present

## 2021-02-09 ENCOUNTER — Ambulatory Visit: Payer: Medicare Other | Admitting: Allergy and Immunology

## 2021-02-09 ENCOUNTER — Ambulatory Visit: Payer: Medicare Other | Admitting: Orthopaedic Surgery

## 2021-02-20 ENCOUNTER — Other Ambulatory Visit (HOSPITAL_COMMUNITY): Payer: Self-pay | Admitting: Physician Assistant

## 2021-02-20 ENCOUNTER — Other Ambulatory Visit: Payer: Self-pay | Admitting: Allergy and Immunology

## 2021-03-02 ENCOUNTER — Ambulatory Visit: Payer: Medicare Other

## 2021-03-02 DIAGNOSIS — E032 Hypothyroidism due to medicaments and other exogenous substances: Secondary | ICD-10-CM | POA: Diagnosis not present

## 2021-03-02 DIAGNOSIS — Z79890 Hormone replacement therapy (postmenopausal): Secondary | ICD-10-CM | POA: Diagnosis not present

## 2021-03-02 DIAGNOSIS — T462X5S Adverse effect of other antidysrhythmic drugs, sequela: Secondary | ICD-10-CM | POA: Diagnosis not present

## 2021-03-03 ENCOUNTER — Other Ambulatory Visit: Payer: Self-pay

## 2021-03-03 ENCOUNTER — Ambulatory Visit (INDEPENDENT_AMBULATORY_CARE_PROVIDER_SITE_OTHER): Payer: Medicare Other

## 2021-03-03 DIAGNOSIS — J4550 Severe persistent asthma, uncomplicated: Secondary | ICD-10-CM

## 2021-03-16 DIAGNOSIS — L570 Actinic keratosis: Secondary | ICD-10-CM | POA: Diagnosis not present

## 2021-03-25 ENCOUNTER — Ambulatory Visit (INDEPENDENT_AMBULATORY_CARE_PROVIDER_SITE_OTHER): Payer: Medicare Other | Admitting: Ophthalmology

## 2021-03-25 ENCOUNTER — Encounter (INDEPENDENT_AMBULATORY_CARE_PROVIDER_SITE_OTHER): Payer: Self-pay | Admitting: Ophthalmology

## 2021-03-25 ENCOUNTER — Other Ambulatory Visit: Payer: Self-pay

## 2021-03-25 DIAGNOSIS — H353211 Exudative age-related macular degeneration, right eye, with active choroidal neovascularization: Secondary | ICD-10-CM

## 2021-03-25 DIAGNOSIS — H2512 Age-related nuclear cataract, left eye: Secondary | ICD-10-CM

## 2021-03-25 DIAGNOSIS — H35351 Cystoid macular degeneration, right eye: Secondary | ICD-10-CM

## 2021-03-25 DIAGNOSIS — H35371 Puckering of macula, right eye: Secondary | ICD-10-CM

## 2021-03-25 DIAGNOSIS — H353132 Nonexudative age-related macular degeneration, bilateral, intermediate dry stage: Secondary | ICD-10-CM

## 2021-03-25 NOTE — Assessment & Plan Note (Signed)
Persistent and secondary to epiretinal membrane

## 2021-03-25 NOTE — Progress Notes (Signed)
03/25/2021     CHIEF COMPLAINT Patient presents for  Chief Complaint  Patient presents with   Retina Follow Up      HISTORY OF PRESENT ILLNESS: Chad Morrison is a 88 y.o. male who presents to the clinic today for:   Follow-up of known CME OD and epiretinal membrane.  History of wet AMD in the past now not on therapy for the last 8 months. HPI     Retina Follow Up           Diagnosis: Wet AMD   Laterality: both eyes   Onset: 3 months ago   Severity: mild   Duration: 3 months   Course: stable         Comments   3 month fu ou and OCT  Pt states VA OU stable since last visit. Pt denies FOL, floaters, or ocular pain OU.  Pt states, "I was suppose to have my right eye cataract surgery on February 01, 2021 but I got sick and now I am going to have it done 04/21/2021."  Pt reports that he uses Prolensa QDAY OD       Last edited by Kendra Opitz, COA on 03/25/2021 10:49 AM.      Referring physician: Haywood Pao, MD [place],  [place] [address]  HISTORICAL INFORMATION:   Selected notes from the [facility]: Current Outpatient Medications (Ophthalmic Drugs)  Medication Sig   PROLENSA 0.07 % SOLN PLACE 1 DROP INTO THE RIGHT EYE IN THE MORNING AND AT BEDTIME.   No current facility-administered medications for this visit. (Ophthalmic Drugs)   Current Outpatient Medications (Other)  Medication Sig   albuterol (VENTOLIN HFA) 108 (90 Base) MCG/ACT inhaler USE 1 TO 2 INHALATIONS     ORALLY EVERY 6 HOURS AS    NEEDED FOR WHEEZING OR     SHORTNESS OF BREATH   ALPRAZolam (XANAX) 0.25 MG tablet Take 0.25 mg by mouth at bedtime as needed for sleep.    Ascorbic Acid (VITAMIN C PO) Take by mouth. Not sure the dosage   Benralizumab (FASENRA [place]) Inject into the skin. Every 8 weeks   budesonide-formoterol (SYMBICORT) 160-4.5 MCG/ACT inhaler Inhale 2 puffs into the lungs 2 (two) times daily.   Calcium Carbonate-Vitamin D  (CALCIUM-VITAMIN D) 600-125 MG-UNIT TABS Take 1 tablet by mouth 2 (two) times daily.    diltiazem (CARDIZEM CD) 180 MG 24 hr capsule TAKE 1 CAPSULE EVERY       MORNING AND AT BEDTIME   diphenhydrAMINE (BENADRYL) 25 MG tablet Take 50 mg by mouth at bedtime as needed.    ELIQUIS 5 MG TABS tablet TAKE 1 TABLET TWICE A DAY   EPINEPHrine 0.3 mg/0.3 mL IJ SOAJ injection SMARTSIG:0.3 Milligram(s) IM Once   esomeprazole (NEXIUM) 40 MG capsule TAKE 1 CAPSULE TWICE DAILY BEFORE MEALS   Ferrous Sulfate (SLOW FE PO) Take by mouth.   levocetirizine (XYZAL) 5 MG tablet Take 1 tablet (5 mg total) by mouth every evening.   levothyroxine (SYNTHROID, LEVOTHROID) 75 MCG tablet Take 75 mcg by mouth daily before breakfast.   montelukast (SINGULAIR) 10 MG tablet Take 1 tablet (10 mg total) by mouth at bedtime.   Multiple Vitamin (MULTIVITAMIN WITH MINERALS) TABS tablet Take 1 tablet daily by mouth.   Multiple Vitamins-Minerals (OCUVITE PRESERVISION PO) Take 1 tablet 2 (two) times daily by mouth.   rosuvastatin (CRESTOR) 5 MG tablet TAKE 1 TABLET DAILY  Current Facility-Administered Medications (Other)  Medication Route   Benralizumab SOSY 30 mg Subcutaneous      REVIEW OF SYSTEMS: ROS   Negative for: Constitutional, Gastrointestinal, Neurological, Skin, Genitourinary, Musculoskeletal, HENT, Endocrine, Cardiovascular, Eyes, Respiratory, Psychiatric, Allergic/Imm, Heme/Lymph Last edited by Hurman Horn, MD on 03/25/2021 11:25 AM.       ALLERGIES Allergies  Allergen Reactions   Penicillins Hives    Has patient had a PCN reaction causing immediate rash, facial/tongue/throat swelling, SOB or lightheadedness with hypotension: No Has patient had a PCN reaction causing severe rash involving mucus membranes or skin necrosis: No Has patient had a PCN reaction that required hospitalization: No Has patient had a PCN reaction occurring within the last 10 years: Yes If all of the above answers are "NO", then may  proceed with Cephalosporin use.    Sulfa Antibiotics Other (See Comments)    Unknown to patient    Latex Hives and Rash    PAST MEDICAL HISTORY Past Medical History:  Diagnosis Date   Anxiety    Asthma    Asthmatic bronchitis    Atrial flutter ([place])    chads2vasc score is 3   Ehrlichiosis 0350   GERD (gastroesophageal reflux disease)    Granulomatosis with polyangiitis (HCC)    HTN (hypertension)    Hyperlipidemia    Paroxysmal atrial fibrillation (HCC)    Prostate cancer (HCC)    Seasonal allergies    Past Surgical History:  Procedure Laterality Date   BASAL CELL CARCINOMA EXCISION  2013   EYELID   HERNIA REPAIR Bilateral 2000, 2002   KNEE ARTHROSCOPY  2000   prostectomy  1995   RADICAL   TONSILLECTOMY  1940    FAMILY HISTORY Family History  Problem Relation Age of Onset   Asthma Mother 51   Allergies Mother    Ulcers Mother    CVA Father 85   Heart Problems Father    Other Father        NEUROLOGIC ISSUES    Other Brother 65       BICYCLE ACCIDENT   Migraines Son    Diabetes Mellitus I Son     SOCIAL HISTORY Social History   Tobacco Use   Smoking status: Former    Packs/day: 0.50    Years: 4.00    Pack years: 2.00    Types: Cigarettes    Quit date: 02/28/1961    Years since quitting: 60.1   Smokeless tobacco: Never  Vaping Use   Vaping Use: Never used  Substance Use Topics   Alcohol use: Yes    Alcohol/week: 2.0 - 3.0 standard drinks    Types: 2 - 3 Standard drinks or equivalent per week   Drug use: No         OPHTHALMIC EXAM:  Base Eye Exam     Visual Acuity (ETDRS)       Right Left   Dist [place] 20/50 -1 20/30 -1   Dist ph [place] 20/40 -2 NI         Tonometry (Tonopen, 10:53 AM)       Right Left   Pressure 13 15         Pupils       Pupils Dark Light Shape React APD   Right PERRL 5 4 Round Slow None   Left PERRL 4 3 Round Slow None         Visual Fields (Counting fingers)       Left Right  Full Full          Extraocular Movement       Right Left    Full Full         Neuro/Psych     Oriented x3: Yes   Mood/Affect: Normal         Dilation     Both eyes: 1.0% Mydriacyl, 2.5% Phenylephrine @ 10:52 AM           Slit Lamp and Fundus Exam     External Exam       Right Left   External Normal Normal         Slit Lamp Exam       Right Left   Lids/Lashes Normal Normal   Conjunctiva/Sclera White and quiet White and quiet   Cornea Clear Clear   Anterior Chamber Deep and quiet has not, no remaining vitreous strand to the cornea Deep and quiet   Iris Round and reactive Round and reactive   Lens Posterior chamber intraocular lens, open PC slightly decentered superotemporal, no vitreous prolapse around the IOL 3+ Nuclear sclerosis   Anterior Vitreous Normal Normal         Fundus Exam       Right Left   Posterior Vitreous Posterior vitreous detachment Normal   Disc Normal Normal   C/D Ratio 0.65 0.55   Macula Normal, subfoveal drusenoid, no obvious CME Hard drusen, Early age related macular degeneration   Vessels Normal Normal   Periphery Normal Normal            IMAGING AND PROCEDURES  Imaging and Procedures for 03/25/21  OCT, Retina - OU - Both Eyes       Right Eye Quality was good. Scan locations included subfoveal. Central Foveal Thickness: 447. Progression has improved. Findings include epiretinal membrane, cystoid macular edema, retinal drusen .   Left Eye Quality was good. Scan locations included subfoveal. Central Foveal Thickness: 345. Progression has been stable. Findings include abnormal foveal contour, retinal drusen .   Notes OD, much improved CME now, yet still active, with subfoveal large drusenoid deposit likely secondary from the overlying epiretinal membrane Nonetheless is also less subretinal fluid as a component of the wet ARMD.  We are treating that component now and yet we will extend interval examination next                ASSESSMENT/PLAN:  Right epiretinal membrane OD, with epiretinal membrane secondary CME as well as outer retinal drusenoid subfoveal deposit worsening.  Will likely need vitrectomy membrane peel in the right eye in the coming weeks or months after cataract surgery is completed in the left eye  Nuclear sclerotic cataract of left eye Dense 3.5+ brunescent cataract in the left eye scheduled for cataract surgery Dr. [name], February 22  Cystoid macular edema of right eye Persistent and secondary to epiretinal membrane     ICD-10-CM   1. Exudative age-related macular degeneration of right eye with active choroidal neovascularization (HCC)  H35.3211 OCT, Retina - OU - Both Eyes    2. Right epiretinal membrane  H35.371     3. Intermediate stage nonexudative age-related macular degeneration of both eyes  H35.3132     4. Nuclear sclerotic cataract of left eye  H25.12     5. Cystoid macular edema of right eye  H35.351       1.  OD epiretinal membrane with secondary CME and large drusenoid deposits secondary to thickening from the epiretinal membrane.  Recommend  vitrectomy membrane peel after pseudophakia is nicely obtained in the left eye as scheduled February 2023  2.  OS proceed with cataract surgery with Dr. Alferd Patee as scheduled  3.  Ophthalmic Meds Ordered this visit:  No orders of the defined types were placed in this encounter.      Return in about 10 weeks (around 06/03/2021) for dilate, OD, OCT,, possible preop vitrectomy membrane peel OD.  There are no Patient Instructions on file for this visit.   Explained the diagnoses, plan, and follow up with the patient and they expressed understanding.  Patient expressed understanding of the importance of proper follow up care.   Clent Demark Caidence Higashi M.D. Diseases & Surgery of the Retina and Vitreous Retina & Diabetic [place] 03/25/21     Abbreviations: M myopia (nearsighted); A astigmatism; H hyperopia (farsighted); P  presbyopia; Mrx spectacle prescription;  CTL contact lenses; OD right eye; OS left eye; OU both eyes  XT exotropia; ET esotropia; PEK punctate epithelial keratitis; PEE punctate epithelial erosions; DES dry eye syndrome; MGD meibomian gland dysfunction; ATs artificial tears; PFAT's preservative free artificial tears; [place] nuclear sclerotic cataract; PSC posterior subcapsular cataract; ERM epi-retinal membrane; PVD posterior vitreous detachment; RD retinal detachment; DM diabetes mellitus; DR diabetic retinopathy; NPDR non-proliferative diabetic retinopathy; PDR proliferative diabetic retinopathy; CSME clinically significant macular edema; DME diabetic macular edema; dbh dot blot hemorrhages; CWS cotton wool spot; POAG primary open angle glaucoma; C/D cup-to-disc ratio; HVF humphrey visual field; GVF goldmann visual field; OCT optical coherence tomography; IOP intraocular pressure; BRVO Branch retinal vein occlusion; CRVO central retinal vein occlusion; CRAO central retinal artery occlusion; BRAO branch retinal artery occlusion; RT retinal tear; SB scleral buckle; PPV pars plana vitrectomy; VH Vitreous hemorrhage; PRP panretinal laser photocoagulation; IVK intravitreal kenalog; VMT vitreomacular traction; MH Macular hole;  NVD neovascularization of the disc; NVE neovascularization elsewhere; AREDS age related eye disease study; ARMD age related macular degeneration; POAG primary open angle glaucoma; EBMD epithelial/anterior basement membrane dystrophy; ACIOL anterior chamber intraocular lens; IOL intraocular lens; PCIOL posterior chamber intraocular lens; Phaco/IOL phacoemulsification with intraocular lens placement; [place] photorefractive keratectomy; LASIK laser assisted in situ keratomileusis; HTN hypertension; DM diabetes mellitus; COPD chronic obstructive pulmonary disease

## 2021-03-25 NOTE — Assessment & Plan Note (Signed)
OD, with epiretinal membrane secondary CME as well as outer retinal drusenoid subfoveal deposit worsening.  Will likely need vitrectomy membrane peel in the right eye in the coming weeks or months after cataract surgery is completed in the left eye

## 2021-03-25 NOTE — Assessment & Plan Note (Signed)
Dense 3.5+ brunescent cataract in the left eye scheduled for cataract surgery Dr. [name], February 22

## 2021-03-29 ENCOUNTER — Encounter (INDEPENDENT_AMBULATORY_CARE_PROVIDER_SITE_OTHER): Payer: Medicare Other | Admitting: Ophthalmology

## 2021-03-29 DIAGNOSIS — J849 Interstitial pulmonary disease, unspecified: Secondary | ICD-10-CM | POA: Diagnosis not present

## 2021-03-29 DIAGNOSIS — J4540 Moderate persistent asthma, uncomplicated: Secondary | ICD-10-CM | POA: Diagnosis not present

## 2021-03-29 DIAGNOSIS — I4892 Unspecified atrial flutter: Secondary | ICD-10-CM | POA: Diagnosis not present

## 2021-03-29 DIAGNOSIS — J302 Other seasonal allergic rhinitis: Secondary | ICD-10-CM | POA: Diagnosis not present

## 2021-03-30 DIAGNOSIS — C44622 Squamous cell carcinoma of skin of right upper limb, including shoulder: Secondary | ICD-10-CM | POA: Diagnosis not present

## 2021-04-06 DIAGNOSIS — M3130 Wegener's granulomatosis without renal involvement: Secondary | ICD-10-CM | POA: Diagnosis not present

## 2021-04-06 DIAGNOSIS — J3489 Other specified disorders of nose and nasal sinuses: Secondary | ICD-10-CM | POA: Diagnosis not present

## 2021-04-13 ENCOUNTER — Other Ambulatory Visit: Payer: Self-pay | Admitting: Allergy and Immunology

## 2021-04-13 DIAGNOSIS — L905 Scar conditions and fibrosis of skin: Secondary | ICD-10-CM | POA: Diagnosis not present

## 2021-04-13 DIAGNOSIS — L570 Actinic keratosis: Secondary | ICD-10-CM | POA: Diagnosis not present

## 2021-04-13 DIAGNOSIS — Z08 Encounter for follow-up examination after completed treatment for malignant neoplasm: Secondary | ICD-10-CM | POA: Diagnosis not present

## 2021-04-13 DIAGNOSIS — C44519 Basal cell carcinoma of skin of other part of trunk: Secondary | ICD-10-CM | POA: Diagnosis not present

## 2021-04-13 DIAGNOSIS — Z85828 Personal history of other malignant neoplasm of skin: Secondary | ICD-10-CM | POA: Diagnosis not present

## 2021-04-21 DIAGNOSIS — H2512 Age-related nuclear cataract, left eye: Secondary | ICD-10-CM | POA: Diagnosis not present

## 2021-04-27 DIAGNOSIS — J4550 Severe persistent asthma, uncomplicated: Secondary | ICD-10-CM | POA: Diagnosis not present

## 2021-04-28 ENCOUNTER — Ambulatory Visit (INDEPENDENT_AMBULATORY_CARE_PROVIDER_SITE_OTHER): Payer: Medicare Other

## 2021-04-28 ENCOUNTER — Other Ambulatory Visit: Payer: Self-pay

## 2021-04-28 DIAGNOSIS — J4550 Severe persistent asthma, uncomplicated: Secondary | ICD-10-CM | POA: Diagnosis not present

## 2021-05-03 IMAGING — DX DG CHEST 2V
2 series · 2 of 2 positions shown · non-contrast
Comparison: 12/04/2017

CLINICAL DATA: Bronchitis

EXAM:
CHEST - 2 VIEW

[chest pa]
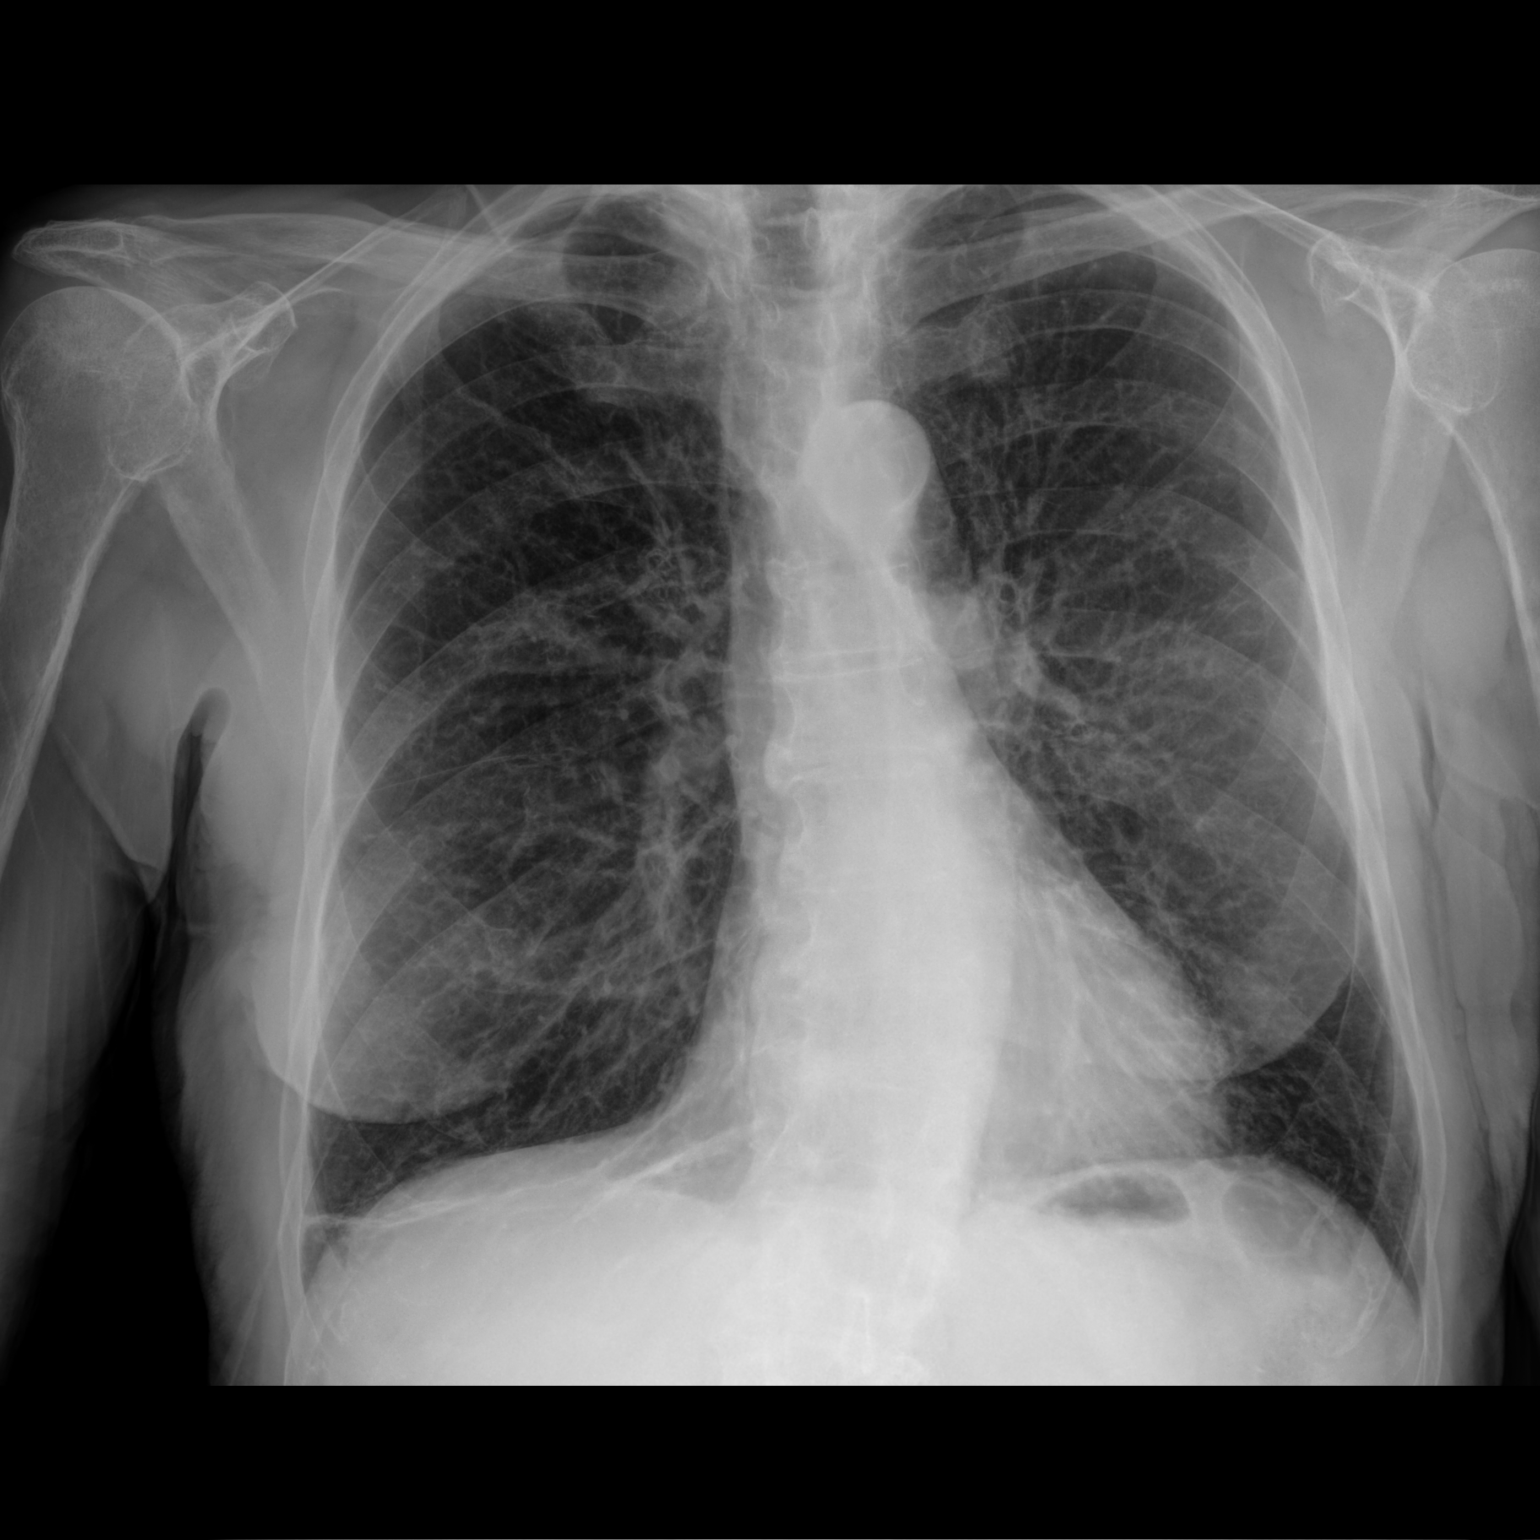

[chest lat]
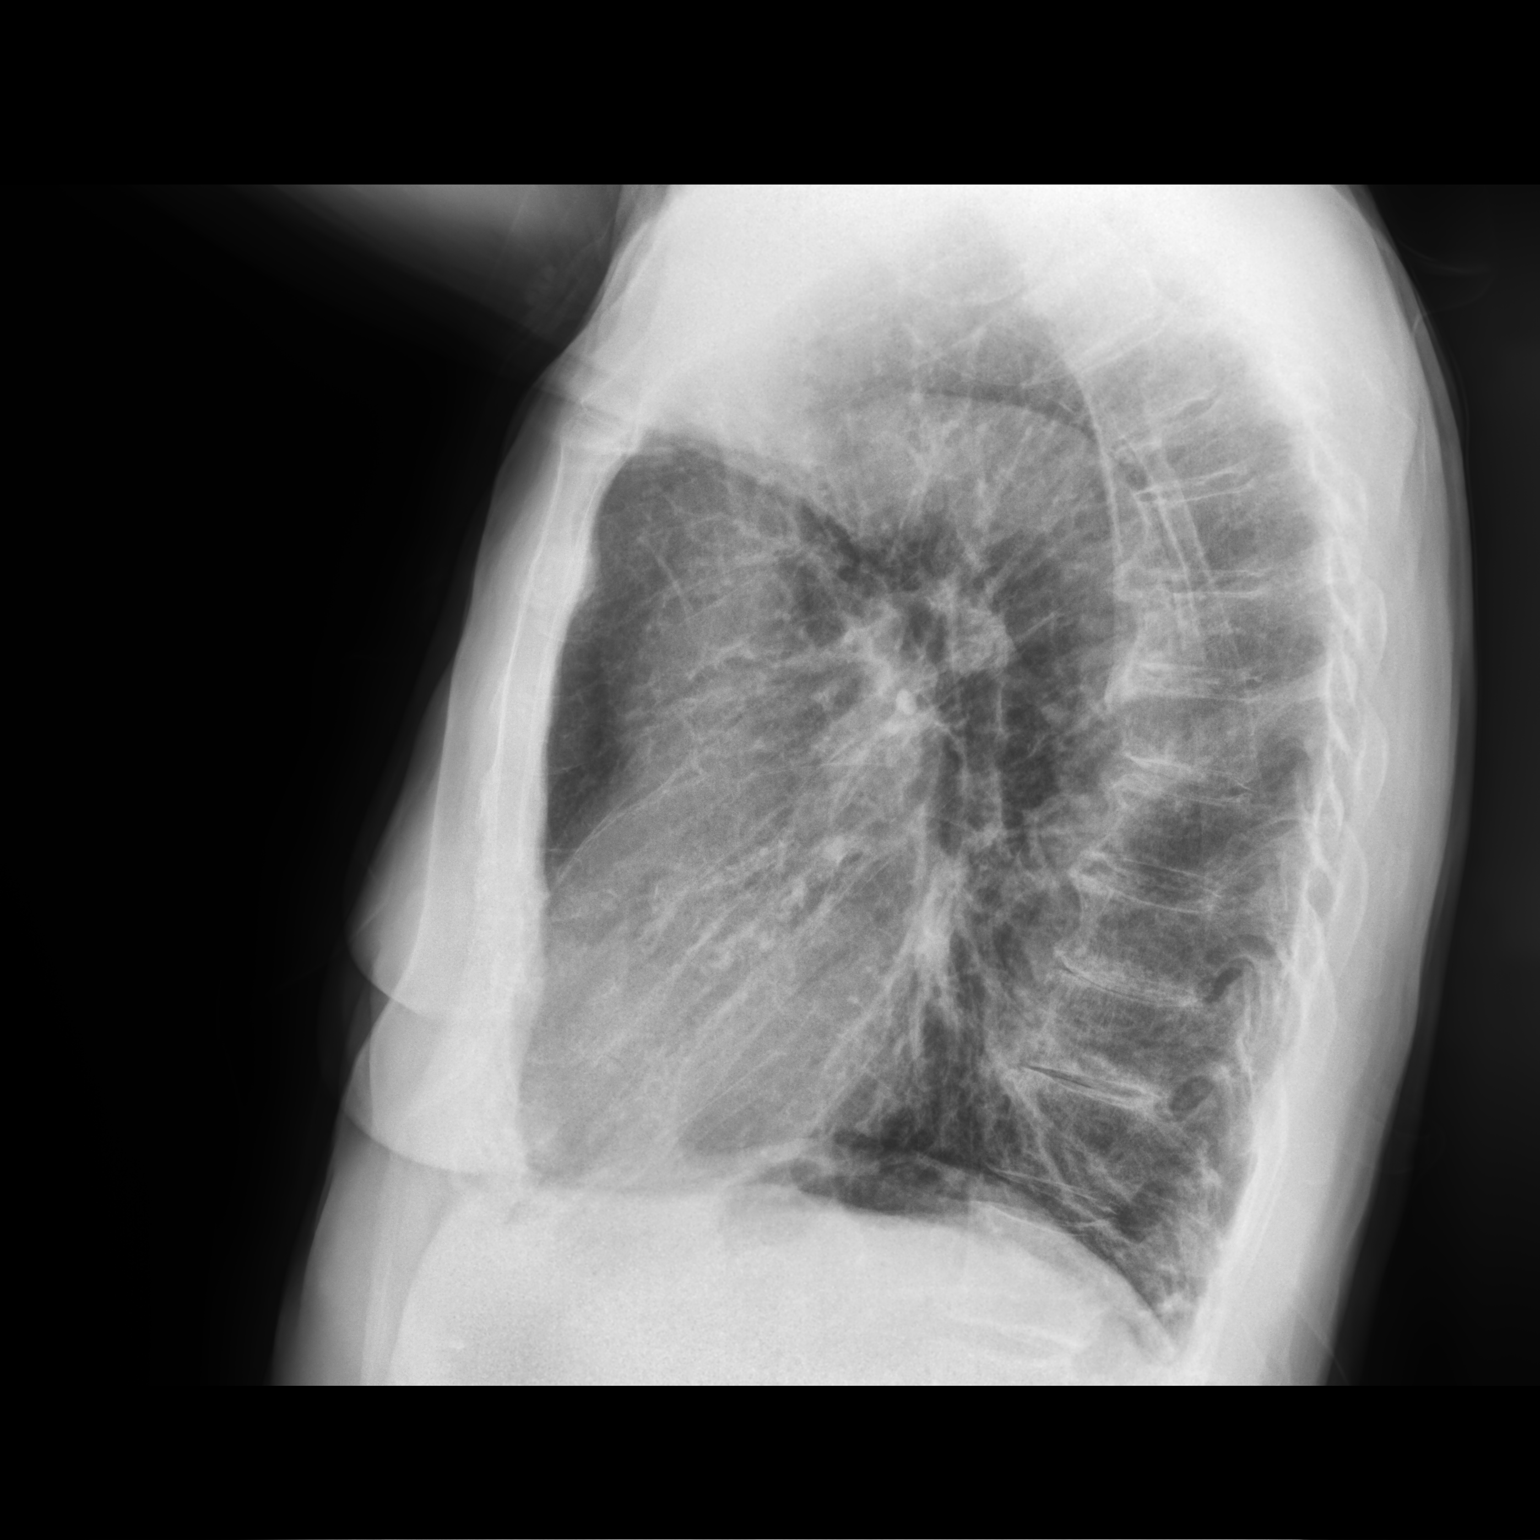

[2 of 2 positions shown; findings below may reference images not displayed]

FINDINGS: Areas of scarring in the lungs bilaterally. Peribronchial
thickening. There is hyperinflation of the lungs compatible with
COPD. Heart is normal size. No effusions or acute bony abnormality.
IMPRESSION: COPD/chronic changes. Chronic peribronchial thickening. No acute
findings.

## 2021-05-04 ENCOUNTER — Other Ambulatory Visit: Payer: Medicare Other

## 2021-05-10 ENCOUNTER — Ambulatory Visit
Admission: RE | Admit: 2021-05-10 | Discharge: 2021-05-10 | Disposition: A | Discharge status: Home / Self Care | Payer: Medicare Other | Source: Ambulatory Visit | Attending: Pulmonary Disease | Admitting: Pulmonary Disease

## 2021-05-10 ENCOUNTER — Other Ambulatory Visit: Payer: Self-pay

## 2021-05-10 DIAGNOSIS — R918 Other nonspecific abnormal finding of lung field: Secondary | ICD-10-CM

## 2021-05-10 DIAGNOSIS — J984 Other disorders of lung: Secondary | ICD-10-CM | POA: Diagnosis not present

## 2021-05-18 DIAGNOSIS — C4359 Malignant melanoma of other part of trunk: Secondary | ICD-10-CM | POA: Diagnosis not present

## 2021-05-18 DIAGNOSIS — L821 Other seborrheic keratosis: Secondary | ICD-10-CM | POA: Diagnosis not present

## 2021-05-18 DIAGNOSIS — Z08 Encounter for follow-up examination after completed treatment for malignant neoplasm: Secondary | ICD-10-CM | POA: Diagnosis not present

## 2021-05-18 DIAGNOSIS — L814 Other melanin hyperpigmentation: Secondary | ICD-10-CM | POA: Diagnosis not present

## 2021-05-18 DIAGNOSIS — D225 Melanocytic nevi of trunk: Secondary | ICD-10-CM | POA: Diagnosis not present

## 2021-05-18 DIAGNOSIS — L905 Scar conditions and fibrosis of skin: Secondary | ICD-10-CM | POA: Diagnosis not present

## 2021-05-18 DIAGNOSIS — Z85820 Personal history of malignant melanoma of skin: Secondary | ICD-10-CM | POA: Diagnosis not present

## 2021-05-18 DIAGNOSIS — L570 Actinic keratosis: Secondary | ICD-10-CM | POA: Diagnosis not present

## 2021-05-18 DIAGNOSIS — D485 Neoplasm of uncertain behavior of skin: Secondary | ICD-10-CM | POA: Diagnosis not present

## 2021-05-18 DIAGNOSIS — Z85828 Personal history of other malignant neoplasm of skin: Secondary | ICD-10-CM | POA: Diagnosis not present

## 2021-05-20 DIAGNOSIS — D485 Neoplasm of uncertain behavior of skin: Secondary | ICD-10-CM | POA: Diagnosis not present

## 2021-05-25 ENCOUNTER — Encounter (INDEPENDENT_AMBULATORY_CARE_PROVIDER_SITE_OTHER): Payer: Self-pay

## 2021-05-26 ENCOUNTER — Encounter: Payer: Self-pay | Admitting: Podiatry

## 2021-05-26 ENCOUNTER — Ambulatory Visit (INDEPENDENT_AMBULATORY_CARE_PROVIDER_SITE_OTHER): Payer: Medicare Other

## 2021-05-26 ENCOUNTER — Ambulatory Visit (INDEPENDENT_AMBULATORY_CARE_PROVIDER_SITE_OTHER): Payer: Medicare Other | Admitting: Podiatry

## 2021-05-26 DIAGNOSIS — L84 Corns and callosities: Secondary | ICD-10-CM | POA: Diagnosis not present

## 2021-05-26 DIAGNOSIS — M2041 Other hammer toe(s) (acquired), right foot: Secondary | ICD-10-CM | POA: Diagnosis not present

## 2021-05-26 DIAGNOSIS — D689 Coagulation defect, unspecified: Secondary | ICD-10-CM | POA: Diagnosis not present

## 2021-05-26 DIAGNOSIS — M7751 Other enthesopathy of right foot: Secondary | ICD-10-CM

## 2021-05-26 DIAGNOSIS — M2042 Other hammer toe(s) (acquired), left foot: Secondary | ICD-10-CM | POA: Diagnosis not present

## 2021-05-26 MED ORDER — TRIAMCINOLONE ACETONIDE 10 MG/ML IJ SUSP
10.0000 mg | Freq: Once | INTRAMUSCULAR | Status: AC
  Administered 2021-05-26: 10 mg

## 2021-05-26 NOTE — Progress Notes (Signed)
Subjective:  ? ?Patient ID: Chad Morrison, male   DOB: 88 y.o.   MRN: [id number]  ? ?HPI ?Patient presents stating he is got significant digital deformities right over left with inflammation and pain of the right big toe and he does still try to be active and states that it makes shoe gear difficult at times.  Patient does not smoke and does try to be active ? ? ?Review of Systems  ?All other systems reviewed and are negative. ? ? ?   ?Objective:  ?Physical Exam ?Vitals and nursing note reviewed.  ?Constitutional:   ?   Appearance: He is well-developed.  ?Pulmonary:  ?   Effort: Pulmonary effort is normal.  ?Musculoskeletal:     ?   General: Normal range of motion.  ?Skin: ?   General: Skin is warm.  ?Neurological:  ?   Mental Status: He is alert.  ?  ?Neurovascular status was found to be mildly diminished but intact with range of motion subtalar midtarsal joint mildly diminished.  Muscle strength adequate patient is found to have on the right foot significant digital contracture at the MPJ with severe contracture of the right hallux with elevated hallux and inflammation of the inner phalangeal joint and on the medial side a keratotic lesion that is formed which is painful.  Patient has good digital perfusion well oriented x3 ? ?   ?Assessment:  ?Significant structural deformity with rigid contracture of the digits moderate muscle strength loss and inflammatory capsulitis of the inner phalangeal joint right big toe along with keratotic lesion formation on the medial side that is painful ? ?   ?Plan:  ?H&P all x-rays reviewed and today I went ahead I did sterile prep I injected the inner phalangeal joint 3 mg dexamethasone Kenalog and on the medial side using sharp sterile debridement I debrided a separate lesion with no angiogenic bleeding applied cushioning to the toe advised on wider shoes and mesh materials and reappoint as needed ? ?X-rays indicate that there is significant digital contracture of the lesser digits  especially of the hallux at the inner phalangeal joint right over left ?   ? ? ?

## 2021-06-03 ENCOUNTER — Other Ambulatory Visit: Payer: Self-pay | Admitting: Allergy and Immunology

## 2021-06-03 ENCOUNTER — Encounter (INDEPENDENT_AMBULATORY_CARE_PROVIDER_SITE_OTHER): Payer: Self-pay | Admitting: Ophthalmology

## 2021-06-03 ENCOUNTER — Ambulatory Visit (INDEPENDENT_AMBULATORY_CARE_PROVIDER_SITE_OTHER): Payer: Medicare Other | Admitting: Ophthalmology

## 2021-06-03 DIAGNOSIS — H2512 Age-related nuclear cataract, left eye: Secondary | ICD-10-CM | POA: Diagnosis not present

## 2021-06-03 DIAGNOSIS — H35371 Puckering of macula, right eye: Secondary | ICD-10-CM

## 2021-06-03 DIAGNOSIS — H353132 Nonexudative age-related macular degeneration, bilateral, intermediate dry stage: Secondary | ICD-10-CM | POA: Diagnosis not present

## 2021-06-03 DIAGNOSIS — Z961 Presence of intraocular lens: Secondary | ICD-10-CM

## 2021-06-03 DIAGNOSIS — H353211 Exudative age-related macular degeneration, right eye, with active choroidal neovascularization: Secondary | ICD-10-CM | POA: Diagnosis not present

## 2021-06-03 DIAGNOSIS — H353212 Exudative age-related macular degeneration, right eye, with inactive choroidal neovascularization: Secondary | ICD-10-CM

## 2021-06-03 DIAGNOSIS — H4301 Vitreous prolapse, right eye: Secondary | ICD-10-CM

## 2021-06-03 DIAGNOSIS — H35351 Cystoid macular degeneration, right eye: Secondary | ICD-10-CM

## 2021-06-03 NOTE — Assessment & Plan Note (Signed)
No ongoing sign of CNVM OU ?

## 2021-06-03 NOTE — Assessment & Plan Note (Signed)
IOL well centered good acuity ?

## 2021-06-03 NOTE — Assessment & Plan Note (Signed)
At time of urgent intervention, will commit anterior chamber with vitreous sweep to uncover any residual vitreous iris chafe ?

## 2021-06-03 NOTE — Assessment & Plan Note (Signed)
Secondary to ERM ?

## 2021-06-03 NOTE — Assessment & Plan Note (Signed)
Severe epiretinal membrane with secondary drusenoid subfoveal relation of the debris of vision processes, likely due to the tractional forces from the overlying epiretinal membrane as well as cortical hyaloid remnant seen on OCT today. ? ?We will offer and suggest vitrectomy membrane peel as well as combined with an anterior segment vitreal lysis to prevent any anterior segment chafing ongoing from the previous vitreous strand in the anterior chamber that might still have a remnant around the pupil ?

## 2021-06-03 NOTE — Assessment & Plan Note (Signed)
No signs of active disease ?

## 2021-06-03 NOTE — Progress Notes (Signed)
? ? ?06/03/2021 ? ?  ? ?CHIEF COMPLAINT ?Patient presents for  ?Chief Complaint  ?Patient presents with  ? Macular Degeneration  ? ? ? ? ?HISTORY OF PRESENT ILLNESS: ?Chad Morrison is a 88 y.o. male who presents to the clinic today for:  ? ?HPI   ?10 weeks Dilate OD, OCT, possible Preop Vitrectomy,Membrane Peel, right eye. ?"I had cataract surgery on my left eye about a month ago by Dr. [name]. My vision has improved in the left eye. My right eye vision is about the same." ?Pt uses Prolensa OD QHS. ?Last edited by Laurin Coder on 06/03/2021 10:51 AM.  ?  ? ? ?Referring physician: ?Darleen Crocker, MD ?[facility] ?[address] ?[place],  [place] [address] ? ?HISTORICAL INFORMATION:  ? ?Selected notes from the [facility] ?  ?   ? ?CURRENT MEDICATIONS: ?Current Outpatient Medications (Ophthalmic Drugs)  ?Medication Sig  ? PROLENSA 0.07 % SOLN PLACE 1 DROP INTO THE RIGHT EYE IN THE MORNING AND AT BEDTIME.  ? ?No current facility-administered medications for this visit. (Ophthalmic Drugs)  ? ?Current Outpatient Medications (Other)  ?Medication Sig  ? albuterol (VENTOLIN HFA) 108 (90 Base) MCG/ACT inhaler USE 1 TO 2 INHALATIONS     ORALLY EVERY 6 HOURS AS    NEEDED FOR WHEEZING OR     SHORTNESS OF BREATH  ? ALPRAZolam (XANAX) 0.25 MG tablet Take 0.25 mg by mouth at bedtime as needed for sleep.   ? Ascorbic Acid (VITAMIN C PO) Take by mouth. Not sure the dosage  ? budesonide-formoterol (SYMBICORT) 160-4.5 MCG/ACT inhaler Inhale 2 puffs into the lungs 2 (two) times daily.  ? Calcium Carbonate-Vitamin D (CALCIUM-VITAMIN D) 600-125 MG-UNIT TABS Take 1 tablet by mouth 2 (two) times daily.   ? diltiazem (CARDIZEM CD) 180 MG 24 hr capsule TAKE 1 CAPSULE EVERY       MORNING AND AT BEDTIME  ? diphenhydrAMINE (BENADRYL) 25 MG tablet Take 50 mg by mouth at bedtime as needed.   ? ELIQUIS 5 MG TABS tablet TAKE 1 TABLET TWICE A DAY  ? EPINEPHrine 0.3 mg/0.3 mL IJ SOAJ injection SMARTSIG:0.3 Milligram(s) IM Once  ? esomeprazole (NEXIUM)  40 MG capsule TAKE 1 CAPSULE TWICE DAILY BEFORE MEALS  ? Ferrous Sulfate (SLOW FE PO) Take by mouth.  ? levocetirizine (XYZAL) 5 MG tablet TAKE 1 TABLET EVERY EVENING  ? levothyroxine (SYNTHROID, LEVOTHROID) 75 MCG tablet Take 75 mcg by mouth daily before breakfast.  ? montelukast (SINGULAIR) 10 MG tablet Take 1 tablet (10 mg total) by mouth at bedtime.  ? Multiple Vitamin (MULTIVITAMIN WITH MINERALS) TABS tablet Take 1 tablet daily by mouth.  ? Multiple Vitamins-Minerals (OCUVITE PRESERVISION PO) Take 1 tablet 2 (two) times daily by mouth.  ? rosuvastatin (CRESTOR) 5 MG tablet TAKE 1 TABLET DAILY  ? ?Current Facility-Administered Medications (Other)  ?Medication Route  ? Benralizumab SOSY 30 mg Subcutaneous  ? ? ? ? ?REVIEW OF SYSTEMS: ? ? ? ?ALLERGIES ?Allergies  ?Allergen Reactions  ? Penicillins Hives  ?  Has patient had a PCN reaction causing immediate rash, facial/tongue/throat swelling, SOB or lightheadedness with hypotension: No ?Has patient had a PCN reaction causing severe rash involving mucus membranes or skin necrosis: No ?Has patient had a PCN reaction that required hospitalization: No ?Has patient had a PCN reaction occurring within the last 10 years: Yes ?If all of the above answers are "NO", then may proceed with Cephalosporin use. ?  ? Sulfa Antibiotics Other (See Comments)  ?  Unknown to patient ?  ? Latex Hives and Rash  ? ? ?PAST MEDICAL HISTORY ?Past Medical History:  ?Diagnosis Date  ? Anxiety   ? Asthma   ? Asthmatic bronchitis   ? Atrial flutter ([facility])   ? chads2vasc score is 3  ? Ehrlichiosis 0277  ? GERD (gastroesophageal reflux disease)   ? Granulomatosis with polyangiitis ([facility])   ? HTN (hypertension)   ? Hyperlipidemia   ? Nuclear sclerotic cataract of left eye 06/04/2019  ? Cataract surgery completed March 2023, Dr. [name]  ? Paroxysmal atrial fibrillation ([facility])   ? Prostate cancer ([facility])   ? Seasonal allergies   ? ?Past Surgical History:  ?Procedure Laterality Date  ? BASAL CELL CARCINOMA  EXCISION  2013  ? EYELID  ? HERNIA REPAIR Bilateral 2000, 2002  ? KNEE ARTHROSCOPY  2000  ? prostectomy  1995  ? RADICAL  ? TONSILLECTOMY  1940  ? ? ?FAMILY HISTORY ?Family History  ?Problem Relation Age of Onset  ? Asthma Mother 23  ? Allergies Mother   ? Ulcers Mother   ? CVA Father 37  ? Heart Problems Father   ? Other Father   ?     NEUROLOGIC ISSUES   ? Other Brother 18  ?     BICYCLE ACCIDENT  ? Migraines Son   ? Diabetes Mellitus I Son   ? ? ?SOCIAL HISTORY ?Social History  ? ?Tobacco Use  ? Smoking status: Former  ?  Packs/day: 0.50  ?  Years: 4.00  ?  Pack years: 2.00  ?  Types: Cigarettes  ?  Quit date: 02/28/1961  ?  Years since quitting: 60.3  ? Smokeless tobacco: Never  ?Vaping Use  ? Vaping Use: Never used  ?Substance Use Topics  ? Alcohol use: Yes  ?  Alcohol/week: 2.0 - 3.0 standard drinks  ?  Types: 2 - 3 Standard drinks or equivalent per week  ? Drug use: No  ? ?  ? ?  ? ?OPHTHALMIC EXAM: ? ?Base Eye Exam   ? ? Visual Acuity (ETDRS)   ? ?   Right Left  ? Dist Crocker 20/60 -1 20/25 -1  ? Dist ph [place] NI   ? ?  ?  ? ? Tonometry (Tonopen, 10:55 AM)   ? ?   Right Left  ? Pressure 12 10  ? ?  ?  ? ? Pupils   ? ?   Pupils Dark Light Shape React APD  ? Right PERRL 4 4 Round Minimal None  ? Left PERRL 4 3 Round Brisk None  ? ?  ?  ? ? Extraocular Movement   ? ?   Right Left  ?  Full Full  ? ?  ?  ? ? Neuro/Psych   ? ? Oriented x3: Yes  ? Mood/Affect: Normal  ? ?  ?  ? ? Dilation   ? ? Right eye: 1.0% Mydriacyl @ 10:55 AM  ? ?  ?  ? ?  ? ?Slit Lamp and Fundus Exam   ? ? External Exam   ? ?   Right Left  ? External Normal Normal  ? ?  ?  ? ? Slit Lamp Exam   ? ?   Right Left  ? Lids/Lashes Normal Normal  ? Conjunctiva/Sclera White and quiet White and quiet  ? Cornea Clear Clear  ? Anterior Chamber Deep and quiet has not, no remaining vitreous strand to the cornea Deep and quiet  ? Iris  Round and reactive Round and reactive  ? Lens Posterior chamber intraocular lens, open PC slightly decentered superotemporal, no  vitreous prolapse around the IOL 3+ Nuclear sclerosis  ? Anterior Vitreous Normal Normal  ? ?  ?  ? ? Fundus Exam   ? ?   Right Left  ? Posterior Vitreous Posterior vitreous detachment Normal  ? Disc Normal Normal  ? C/D Ratio 0.65 0.55  ? Macula Normal, subfoveal drusenoid, no obvious CME Hard drusen, Early age related macular degeneration  ? Vessels Normal Normal  ? Periphery Normal Normal  ? ?  ?  ? ?  ? ? ?IMAGING AND PROCEDURES  ?Imaging and Procedures for 06/03/21 ? ?OCT, Retina - OU - Both Eyes   ? ?   ?Right Eye ?Quality was good. Scan locations included subfoveal. Central Foveal Thickness: 467. Progression has improved. Findings include epiretinal membrane, cystoid macular edema, retinal drusen .  ? ?Left Eye ?Quality was good. Scan locations included subfoveal. Central Foveal Thickness: 359. Progression has been stable. Findings include abnormal foveal contour, retinal drusen .  ? ?Notes ?OD, much improved CME now, yet still active, with subfoveal large drusenoid deposit likely secondary from the overlying epiretinal membrane, with slightly large drusenoid subfoveal appearance.  Epiretinal membrane visible but also remnant of cortical hyaloid appears to be present. ?Nonetheless is also less subretinal fluid as a component of the wet ARMD.  We are treating that component now and yet we will extend interval examination next ? ? ? ?  ? ? ?  ?  ? ?  ?ASSESSMENT/PLAN: ? ?Right epiretinal membrane ?Severe epiretinal membrane with secondary drusenoid subfoveal relation of the debris of vision processes, likely due to the tractional forces from the overlying epiretinal membrane as well as cortical hyaloid remnant seen on OCT today. ? ?We will offer and suggest vitrectomy membrane peel as well as combined with an anterior segment vitreal lysis to prevent any anterior segment chafing ongoing from the previous vitreous strand in the anterior chamber that might still have a remnant around the pupil ? ?Pseudophakia,  left eye ?IOL well centered good acuity ? ?Intermediate stage nonexudative age-related macular degeneration of both eyes ?No ongoing sign of CNVM OU ? ?Exudative age-related macular degeneration of right ey

## 2021-06-15 ENCOUNTER — Ambulatory Visit (HOSPITAL_COMMUNITY)
Admission: RE | Admit: 2021-06-15 | Discharge: 2021-06-15 | Disposition: A | Discharge status: Home / Self Care | Payer: Medicare Other | Source: Ambulatory Visit | Attending: Nurse Practitioner | Admitting: Nurse Practitioner

## 2021-06-15 VITALS — BP 164/72 | HR 70 | Ht 69.0 in | Wt 153.2 lb

## 2021-06-15 DIAGNOSIS — K219 Gastro-esophageal reflux disease without esophagitis: Secondary | ICD-10-CM | POA: Diagnosis not present

## 2021-06-15 DIAGNOSIS — Z7901 Long term (current) use of anticoagulants: Secondary | ICD-10-CM | POA: Insufficient documentation

## 2021-06-15 DIAGNOSIS — D6869 Other thrombophilia: Secondary | ICD-10-CM | POA: Diagnosis not present

## 2021-06-15 DIAGNOSIS — Z79899 Other long term (current) drug therapy: Secondary | ICD-10-CM | POA: Insufficient documentation

## 2021-06-15 DIAGNOSIS — I483 Typical atrial flutter: Secondary | ICD-10-CM | POA: Diagnosis not present

## 2021-06-15 DIAGNOSIS — I350 Nonrheumatic aortic (valve) stenosis: Secondary | ICD-10-CM | POA: Insufficient documentation

## 2021-06-15 DIAGNOSIS — I4821 Permanent atrial fibrillation: Secondary | ICD-10-CM | POA: Diagnosis not present

## 2021-06-15 DIAGNOSIS — J45909 Unspecified asthma, uncomplicated: Secondary | ICD-10-CM | POA: Diagnosis present

## 2021-06-15 DIAGNOSIS — I776 Arteritis, unspecified: Secondary | ICD-10-CM | POA: Diagnosis not present

## 2021-06-15 NOTE — Progress Notes (Signed)
? ?Primary Care Physician: Tisovec, Fransico Him, MD ?Referring Physician: Dr. [name] ? ? ?Chad Morrison is a 88 y.o. male with a h/o typical atrial flutter, on amiodarone for evaluation in the afib clinic. He saw Dr. [name] in August and was c/o of shortness of breath. There was concern of amiodarone toxicity and pt asked to reduce amio to 100 mg a day. He had refused typical atrial flutter ablation in the past. He saw pulmonology and found to be in rapid atrial fibrillation. Dr. [name]  was notified  and he was asked to have f/u here. He increased his amio back to 200 mg. Pulmonology is planning to repeat PFT's when his heart rate is controlled. 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 Pulmonology  questioned if lungs were also involved contributing to increased shortness of breath. He saw Dr. [name] 10/10 Amiodarone was stopped and Multaq was started. He is on Eliquis for a CHADS2VASC score of 3. Multaq was discontinued 2/2 persistent atrial fibrillation.  ? ?On follow up today, patient reports he has done well since his last visit. He denies any heart racing or palpitations. He denies any bleeding issues on anticoagulation.  ? ?F/u in afib clinic, 06/15/21. He is here for one  year f/u. He is in permanent afib and tolerates well.  He is rate controlled. He is taking eliquis 5 mg bid, appropriately dosed. No bleeding issues.  ? ?Today, he denies symptoms of palpitations, chest pain, orthopnea, PND, lower extremity edema, dizziness, presyncope, syncope, or neurologic sequela. The patient is tolerating medications without difficulties and is otherwise without complaint today. ? ?Past Medical History:  ?Diagnosis Date  ? Anxiety   ? Asthma   ? Asthmatic bronchitis   ? Atrial flutter ([name])   ? chads2vasc score is 3  ? Ehrlichiosis 4097  ? GERD (gastroesophageal reflux disease)   ? Granulomatosis with polyangiitis ([name])   ? HTN (hypertension)   ? Hyperlipidemia   ? Nuclear sclerotic cataract of  left eye 06/04/2019  ? Cataract surgery completed March 2023, Dr. [name]  ? Paroxysmal atrial fibrillation (H[facility])   ? Prostate cancer ([name])   ? Seasonal allergies   ? ?Past Surgical History:  ?Procedure Laterality Date  ? BASAL CELL CARCINOMA EXCISION  2013  ? EYELID  ? HERNIA REPAIR Bilateral 2000, 2002  ? KNEE ARTHROSCOPY  2000  ? prostectomy  1995  ? RADICAL  ? TONSILLECTOMY  1940  ? ? ?Current Outpatient Medications  ?Medication Sig Dispense Refill  ? albuterol (VENTOLIN HFA) 108 (90 Base) MCG/ACT inhaler USE 1 TO 2 INHALATIONS     ORALLY EVERY 6 HOURS AS    NEEDED FOR WHEEZING OR     SHORTNESS OF BREATH 54 g 3  ? ALPRAZolam (XANAX) 0.25 MG tablet Take 0.25 mg by mouth at bedtime as needed for sleep.     ? Ascorbic Acid (VITAMIN C PO) Take 1 tablet by mouth every morning. Not sure the dosage    ? azelastine (ASTELIN) 0.1 % nasal spray Place 1 spray into both nostrils 2 (two) times daily.    ? Calcium Carbonate-Vitamin D (CALCIUM-VITAMIN D) 600-125 MG-UNIT TABS Take 1 tablet by mouth 2 (two) times daily.     ? diltiazem (CARDIZEM CD) 180 MG 24 hr capsule TAKE 1 CAPSULE EVERY       MORNING AND AT BEDTIME 180 capsule 3  ? ELIQUIS 5 MG TABS tablet TAKE 1 TABLET TWICE A DAY 180 tablet 3  ?  EPINEPHrine 0.3 mg/0.3 mL IJ SOAJ injection SMARTSIG:0.3 Milligram(s) IM Once    ? esomeprazole (NEXIUM) 40 MG capsule TAKE 1 CAPSULE TWICE DAILY BEFORE MEALS 180 capsule 3  ? Ferrous Sulfate (SLOW FE PO) Take 1 tablet by mouth every morning.    ? fluorouracil (EFUDEX) 5 % cream SMARTSIG:sparingly Topical Twice Daily    ? levocetirizine (XYZAL) 5 MG tablet TAKE 1 TABLET EVERY EVENING 90 tablet 1  ? levothyroxine (SYNTHROID, LEVOTHROID) 75 MCG tablet Take 75 mcg by mouth daily before breakfast.    ? montelukast (SINGULAIR) 10 MG tablet Take 1 tablet (10 mg total) by mouth at bedtime. 90 tablet 3  ? Multiple Vitamin (MULTIVITAMIN WITH MINERALS) TABS tablet Take 1 tablet daily by mouth.    ? Multiple Vitamins-Minerals (OCUVITE  PRESERVISION PO) Take 1 tablet 2 (two) times daily by mouth.    ? Polyethyl Glycol-Propyl Glycol (SYSTANE OP) Apply 1 drop to eye every morning.    ? PROLENSA 0.07 % SOLN PLACE 1 DROP INTO THE RIGHT EYE IN THE MORNING AND AT BEDTIME. 5 mL 12  ? rosuvastatin (CRESTOR) 5 MG tablet TAKE 1 TABLET DAILY 90 tablet 2  ? SYMBICORT 160-4.5 MCG/ACT inhaler USE 2 INHALATIONS ORALLY   EVERY MORNING AND AT       BEDTIME 30.6 g 1  ? ?Current Facility-Administered Medications  ?Medication Dose Route Frequency Provider Last Rate Last Admin  ? Benralizumab SOSY 30 mg  30 mg Subcutaneous Q8 Trixie Dredge M, DO   30 mg at 04/28/21 1696  ? ? ?Allergies  ?Allergen Reactions  ? Other Other (See Comments)  ?  Latex Gloves  ? Penicillins Hives  ?  Has patient had a PCN reaction causing immediate rash, facial/tongue/throat swelling, SOB or lightheadedness with hypotension: No ?Has patient had a PCN reaction causing severe rash involving mucus membranes or skin necrosis: No ?Has patient had a PCN reaction that required hospitalization: No ?Has patient had a PCN reaction occurring within the last 10 years: Yes ?If all of the above answers are "NO", then may proceed with Cephalosporin use. ?  ? Sulfa Antibiotics Other (See Comments)  ?  Unknown to patient ?  ? Sulfamethoxazole-Trimethoprim Other (See Comments)  ? Latex Hives and Rash  ? ? ?Social History  ? ?Socioeconomic History  ? Marital status: Married  ?  Spouse name: [name]  ? Number of children: 2  ? Years of education: Not on file  ? Highest education level: Not on file  ?Occupational History  ? Occupation: RETIRED  ?Tobacco Use  ? Smoking status: Former  ?  Packs/day: 0.50  ?  Years: 4.00  ?  Pack years: 2.00  ?  Types: Cigarettes  ?  Quit date: 02/28/1961  ?  Years since quitting: 60.3  ? Smokeless tobacco: Never  ?Vaping Use  ? Vaping Use: Never used  ?Substance and Sexual Activity  ? Alcohol use: Yes  ?  Alcohol/week: 2.0 - 3.0 standard drinks  ?  Types: 2 - 3 Standard drinks or  equivalent per week  ? Drug use: No  ? Sexual activity: Not on file  ?Other Topics Concern  ? Not on file  ?Social History Narrative  ? Pt recently moved to [place] from [place] to be near his son.  ? Retired.  ? ?Social Determinants of Health  ? ?Financial Resource Strain: Not on file  ?Food Insecurity: Not on file  ?Transportation Needs: Not on file  ?Physical Activity: Not on file  ?Stress:  Not on file  ?Social Connections: Not on file  ?Intimate Partner Violence: Not on file  ? ? ?Family History  ?Problem Relation Age of Onset  ? Asthma Mother 19  ? Allergies Mother   ? Ulcers Mother   ? CVA Father 22  ? Heart Problems Father   ? Other Father   ?     NEUROLOGIC ISSUES   ? Other Brother 32  ?     BICYCLE ACCIDENT  ? Migraines Son   ? Diabetes Mellitus I Son   ? ? ?ROS- All systems are reviewed and negative except as per the HPI above ? ?Physical Exam: ?Vitals:  ? 06/15/21 1112  ?BP: (!) 164/72  ?Pulse: 70  ?Weight: 69.5 kg  ?Height: '5\' 9"'$  (1.753 m)  ? ?Wt Readings from Last 3 Encounters:  ?06/15/21 69.5 kg  ?11/24/20 69.2 kg  ?08/04/20 71.4 kg  ? ? ?Labs: ?Lab Results  ?Component Value Date  ? NA 135 06/22/2020  ? K 3.4 (L) 06/22/2020  ? CL 106 06/22/2020  ? CO2 21 (L) 06/22/2020  ? GLUCOSE 135 (H) 06/22/2020  ? BUN 17 06/22/2020  ? CREATININE 1.20 06/22/2020  ? CALCIUM 8.7 (L) 06/22/2020  ? ?No results found for: INR ?No results found for: CHOL, HDL, LDLCALC, TRIG ? ? ?GEN- The patient is a well appearing elderly male, alert and oriented x 3 today.   ?HEENT-head normocephalic, atraumatic, sclera clear, conjunctiva pink, hearing intact, trachea midline. ?Lungs- Clear to ausculation bilaterally, normal work of breathing ?Heart- irregular rate and rhythm, no rubs or gallops. 2/6 systolic murmur   ?GI- soft, NT, ND, + BS ?Extremities- no clubbing, cyanosis, or edema ?MS- no significant deformity or atrophy ?Skin- no rash or lesion ?Psych- euthymic mood, full affect ?Neuro- strength and sensation are  intact ? ? ?Vent. rate 70 BPM ?PR interval * ms ?QRS duration 158 ms ?QT/QTcB 438/473 ms ?P-R-T axes * 15 -20 ?Atrial fibrillation ?Right bundle branch block ?Septal infarct , age undetermined ?T wave abnormality, consider

## 2021-06-17 DIAGNOSIS — M159 Polyosteoarthritis, unspecified: Secondary | ICD-10-CM | POA: Diagnosis not present

## 2021-06-17 DIAGNOSIS — M3130 Wegener's granulomatosis without renal involvement: Secondary | ICD-10-CM | POA: Diagnosis not present

## 2021-06-21 DIAGNOSIS — K219 Gastro-esophageal reflux disease without esophagitis: Secondary | ICD-10-CM | POA: Diagnosis not present

## 2021-06-21 DIAGNOSIS — D472 Monoclonal gammopathy: Secondary | ICD-10-CM | POA: Diagnosis not present

## 2021-06-21 DIAGNOSIS — J324 Chronic pansinusitis: Secondary | ICD-10-CM | POA: Diagnosis not present

## 2021-06-21 DIAGNOSIS — J4550 Severe persistent asthma, uncomplicated: Secondary | ICD-10-CM | POA: Diagnosis not present

## 2021-06-21 DIAGNOSIS — J3089 Other allergic rhinitis: Secondary | ICD-10-CM | POA: Diagnosis not present

## 2021-06-22 ENCOUNTER — Encounter: Payer: Self-pay | Admitting: Allergy and Immunology

## 2021-06-22 ENCOUNTER — Other Ambulatory Visit: Payer: Self-pay | Admitting: Allergy and Immunology

## 2021-06-22 ENCOUNTER — Ambulatory Visit (INDEPENDENT_AMBULATORY_CARE_PROVIDER_SITE_OTHER): Payer: Medicare Other | Admitting: Allergy and Immunology

## 2021-06-22 ENCOUNTER — Ambulatory Visit: Payer: Medicare Other

## 2021-06-22 VITALS — BP 136/62 | HR 74 | Temp 97.7°F | Resp 16 | Ht 69.0 in | Wt 153.0 lb

## 2021-06-22 DIAGNOSIS — J3089 Other allergic rhinitis: Secondary | ICD-10-CM

## 2021-06-22 DIAGNOSIS — J4550 Severe persistent asthma, uncomplicated: Secondary | ICD-10-CM | POA: Diagnosis not present

## 2021-06-22 DIAGNOSIS — K219 Gastro-esophageal reflux disease without esophagitis: Secondary | ICD-10-CM | POA: Diagnosis not present

## 2021-06-22 DIAGNOSIS — J324 Chronic pansinusitis: Secondary | ICD-10-CM | POA: Diagnosis not present

## 2021-06-22 DIAGNOSIS — D472 Monoclonal gammopathy: Secondary | ICD-10-CM | POA: Diagnosis not present

## 2021-06-22 DIAGNOSIS — J3489 Other specified disorders of nose and nasal sinuses: Secondary | ICD-10-CM

## 2021-06-22 MED ORDER — MONTELUKAST SODIUM 10 MG PO TABS
10.0000 mg | ORAL_TABLET | Freq: Every day | ORAL | 3 refills | Status: DC
Start: 2021-06-22 — End: 2021-12-21

## 2021-06-22 MED ORDER — EPINEPHRINE 0.3 MG/0.3ML IJ SOAJ: INTRAMUSCULAR | 1 refills | Status: DC

## 2021-06-22 MED ORDER — BUDESONIDE-FORMOTEROL FUMARATE 160-4.5 MCG/ACT IN AERO: 2.0000 | INHALATION_SPRAY | Freq: Two times a day (BID) | RESPIRATORY_TRACT | 1 refills | Status: DC

## 2021-06-22 MED ORDER — ESOMEPRAZOLE MAGNESIUM 40 MG PO CPDR: 40.0000 mg | DELAYED_RELEASE_CAPSULE | Freq: Two times a day (BID) | ORAL | 5 refills | Status: DC

## 2021-06-22 MED ORDER — ALBUTEROL SULFATE HFA 108 (90 BASE) MCG/ACT IN AERS: 2.0000 | INHALATION_SPRAY | RESPIRATORY_TRACT | 1 refills | Status: DC | PRN

## 2021-06-22 MED ORDER — LEVOCETIRIZINE DIHYDROCHLORIDE 5 MG PO TABS: 5.0000 mg | ORAL_TABLET | Freq: Two times a day (BID) | ORAL | 1 refills | Status: DC | PRN

## 2021-06-22 NOTE — Patient Instructions (Addendum)
?  1.  Continue to treat and prevent inflammation: ? ? A. Symbicort 160 - 2 inhalations 1-2 times per day   ? B. Montelukast 10 mg - 1 tablet 1 time per day ? D. Benralizumab injections   ? ?2.  Continue to treat and prevent reflux: ? ? A. Minimize caffeine consumption  ? B. Nexium 40 mg - 1-2 times per day ? ?3. If needed: ? ? A. albuterol HFA - 2 inhalations every 4-6 hours ? B. OTC Antihistamine ? ?4. Return to clinic in 6 months or earlier if problem ? ?5. Blood - SPEP w reflex to immunofixation. ? ?

## 2021-06-22 NOTE — Progress Notes (Signed)
? ?[facility] ? ? ?Follow-up Note ? ?Referring Provider: Haywood Pao, MD ?Primary Provider: Haywood Pao, MD ?Date of Office Visit: 06/22/2021 ? ?Subjective:  ? ?Chad Morrison (DOB: 05-25-1932) is a 88 y.o. male who returns to the [facility] on 06/22/2021 in re-evaluation of the following: ? ?HPI: Chad Morrison returns to this clinic in evaluation of eosinophilic asthma treated with benralizumab, history of chronic sinusitis, history of septal perforation, history of Wegener's granulomatosis, history of LPR, history of IgG hypogammaglobulinemia with elevated IgM.  I last saw him in this clinic on 04 August 2020. ? ?He continues on benralizumab injections and this treatment has resulted in dramatic improvement regarding his airway and at this point in time he has no symptoms involving either his upper or lower airway.  He has not required a systemic steroid or an antibiotic for any type of airway issue and he rarely if ever uses a short acting bronchodilator. ? ?His reflux is under very good control while using Nexium. ? ?He is set up for retinal surgery involving his right eye sometime in the next week or so. ? ?Allergies as of 06/22/2021   ? ?   Reactions  ? Other Other (See Comments)  ? Latex Gloves  ? Penicillins Hives  ? Has patient had a PCN reaction causing immediate rash, facial/tongue/throat swelling, SOB or lightheadedness with hypotension: No ?Has patient had a PCN reaction causing severe rash involving mucus membranes or skin necrosis: No ?Has patient had a PCN reaction that required hospitalization: No ?Has patient had a PCN reaction occurring within the last 10 years: Yes ?If all of the above answers are "NO", then may proceed with Cephalosporin use.  ? Sulfa Antibiotics Other (See Comments)  ? Unknown to patient  ? Sulfamethoxazole-trimethoprim Other (See Comments)  ? Latex Hives, Rash  ? ?  ? ?  ?Medication List  ? ? ?albuterol 108 (90 Base)  MCG/ACT inhaler ?Commonly known as: VENTOLIN HFA ?USE 1 TO 2 INHALATIONS     ORALLY EVERY 6 HOURS AS    NEEDED FOR WHEEZING OR     SHORTNESS OF BREATH ?  ?ALPRAZolam 0.25 MG tablet ?Commonly known as: Duanne Moron ?Take 0.25 mg by mouth at bedtime as needed for sleep. ?  ?azelastine 0.1 % nasal spray ?Commonly known as: ASTELIN ?Place 1 spray into both nostrils 2 (two) times daily. ?  ?Calcium-Vitamin D 600-125 MG-UNIT Tabs ?Take 1 tablet by mouth 2 (two) times daily. ?  ?diltiazem 180 MG 24 hr capsule ?Commonly known as: CARDIZEM CD ?TAKE 1 CAPSULE EVERY       MORNING AND AT BEDTIME ?  ?Eliquis 5 MG Tabs tablet ?Generic drug: apixaban ?TAKE 1 TABLET TWICE A DAY ?  ?EPINEPHrine 0.3 mg/0.3 mL Soaj injection ?Commonly known as: EPI-PEN ?SMARTSIG:0.3 Milligram(s) IM Once ?  ?esomeprazole 40 MG capsule ?Commonly known as: [place] ?TAKE 1 CAPSULE TWICE DAILY BEFORE MEALS ?  ?fluorouracil 5 % cream ?Commonly known as: EFUDEX ?SMARTSIG:sparingly Topical Twice Daily ?  ?levocetirizine 5 MG tablet ?Commonly known as: XYZAL ?TAKE 1 TABLET EVERY EVENING ?  ?levothyroxine 75 MCG tablet ?Commonly known as: SYNTHROID ?Take 75 mcg by mouth daily before breakfast. ?  ?montelukast 10 MG tablet ?Commonly known as: SINGULAIR ?Take 1 tablet (10 mg total) by mouth at bedtime. ?  ?multivitamin with minerals Tabs tablet ?Take 1 tablet daily by mouth. ?  ?OCUVITE PRESERVISION PO ?Take 1 tablet 2 (two) times daily  by mouth. ?  ?Prolensa 0.07 % Soln ?Generic drug: Bromfenac Sodium ?PLACE 1 DROP INTO THE RIGHT EYE IN THE MORNING AND AT BEDTIME. ?  ?Propylene Glycol 0.6 % Soln ?Apply 1 drop to eye in the morning. ?  ?rosuvastatin 5 MG tablet ?Commonly known as: CRESTOR ?TAKE 1 TABLET DAILY ?  ?SLOW FE PO ?Take 1 tablet by mouth every morning. ?  ?Symbicort 160-4.5 MCG/ACT inhaler ?Generic drug: budesonide-formoterol ?USE 2 INHALATIONS ORALLY   EVERY MORNING AND AT       BEDTIME ?  ?SYSTANE OP ?Apply 1 drop to eye every morning. ?  ?VITAMIN C PO ?Take  1 tablet by mouth every morning. Not sure the dosage ?  ? ?Past Medical History:  ?Diagnosis Date  ? Anxiety   ? Asthma   ? Asthmatic bronchitis   ? Atrial flutter ([facility])   ? chads2vasc score is 3  ? Ehrlichiosis 5809  ? GERD (gastroesophageal reflux disease)   ? Granulomatosis with polyangiitis ([place])   ? HTN (hypertension)   ? Hyperlipidemia   ? Nuclear sclerotic cataract of left eye 06/04/2019  ? Cataract surgery completed March 2023, Dr. [name]  ? Paroxysmal atrial fibrillation (HCC)   ? Prostate cancer ([place])   ? Seasonal allergies   ? ? ?Past Surgical History:  ?Procedure Laterality Date  ? BASAL CELL CARCINOMA EXCISION  2013  ? EYELID  ? HERNIA REPAIR Bilateral 2000, 2002  ? KNEE ARTHROSCOPY  2000  ? prostectomy  1995  ? RADICAL  ? TONSILLECTOMY  1940  ? ? ?Review of systems negative except as noted in HPI / PMHx or noted below: ? ?Review of Systems  ?Constitutional: Negative.   ?HENT: Negative.    ?Eyes: Negative.   ?Respiratory: Negative.    ?Cardiovascular: Negative.   ?Gastrointestinal: Negative.   ?Genitourinary: Negative.   ?Musculoskeletal: Negative.   ?Skin: Negative.   ?Neurological: Negative.   ?Endo/Heme/Allergies: Negative.   ?Psychiatric/Behavioral: Negative.    ? ? ?Objective:  ? ?Vitals:  ? 06/22/21 1129  ?BP: 136/62  ?Pulse: 74  ?Resp: 16  ?Temp: 97.7 ?F (36.5 ?C)  ?SpO2: 97%  ? ?Height: '5\' 9"'$  (175.3 cm)  ?Weight: 153 lb (69.4 kg)  ? ?Physical Exam ?Constitutional:   ?   Appearance: He is not diaphoretic.  ?HENT:  ?   Head: Normocephalic.  ?   Right Ear: Tympanic membrane, ear canal and external ear normal.  ?   Left Ear: Tympanic membrane, ear canal and external ear normal.  ?   Nose: Nose normal. No mucosal edema (Septal perforation with clear borders) or rhinorrhea.  ?   Mouth/Throat:  ?   Pharynx: Uvula midline. No oropharyngeal exudate.  ?Eyes:  ?   Conjunctiva/sclera: Conjunctivae normal.  ?Neck:  ?   Thyroid: No thyromegaly.  ?   Trachea: Trachea normal. No tracheal tenderness or tracheal  deviation.  ?Cardiovascular:  ?   Rate and Rhythm: Normal rate and regular rhythm.  ?   Heart sounds: S1 normal and S2 normal. Murmur (Prolonged systolic) heard.  ?Pulmonary:  ?   Effort: No respiratory distress.  ?   Breath sounds: Normal breath sounds. No stridor. No wheezing or rales.  ?Lymphadenopathy:  ?   Head:  ?   Right side of head: No tonsillar adenopathy.  ?   Left side of head: No tonsillar adenopathy.  ?   Cervical: No cervical adenopathy.  ?Skin: ?   Findings: No erythema or rash.  ?   Nails: There  is no clubbing.  ?Neurological:  ?   Mental Status: He is alert.  ? ? ?Diagnostics:  ?  ?Spirometry was performed and demonstrated an FEV1 of 2.14 at 82 % of predicted. ? ?Results of blood tests obtained 04 August 2020 identifies high titers of antibodies directed against multiple serotypes of pneumococcus as a result of his Pneumovax vaccination, SPEP with M spike at 0.4 g/DL, IgG 536 mg/DL, IgA 74 MGs/DL, IgM 980 mg/DL, kappa free light chain 37.2 mg/L, lambda free light chain 49.3 mg/L. ? ?Assessment and Plan:  ? ?1. Severe persistent asthma without complication   ?2. Monoclonal gammopathy   ?3. Chronic pansinusitis   ?4. Perennial allergic rhinitis   ?5. Nasal septal perforation   ?6. LPRD (laryngopharyngeal reflux disease)   ? ? ?1.  Continue to treat and prevent inflammation: ? ? A. Symbicort 160 - 2 inhalations 1-2 times per day   ? B. Montelukast 10 mg - 1 tablet 1 time per day ? D. Benralizumab injections   ? ?2.  Continue to treat and prevent reflux: ? ? A. Minimize caffeine consumption  ? B. Nexium 40 mg - 1-2 times per day ? ?3. If needed: ? ? A. albuterol HFA - 2 inhalations every 4-6 hours ? B. OTC Antihistamine ? ?4. Return to clinic in 6 months or earlier if problem ? ?5. Blood - SPEP w reflex to immunofixation. ? ?Chad Morrison appears to be doing quite well regarding his respiratory tract issue and there may be an opportunity to consolidate some of his treatment and he can decrease his Symbicort to 1  time per day.  Likewise, his reflux and LPR is under excellent control and he can attempt to consolidate his Nexium to 1 time per day.  He will continue on benralizumab injections which has been the one t

## 2021-06-23 ENCOUNTER — Ambulatory Visit: Payer: Medicare Other

## 2021-06-23 ENCOUNTER — Encounter: Payer: Self-pay | Admitting: Allergy and Immunology

## 2021-06-24 LAB — PROTEIN ELECTROPHORESIS, SERUM
A/G Ratio: 1.4 (ref 0.7–1.7)
Albumin ELP: 3.9 g/dL (ref 2.9–4.4)
Alpha 1: 0.2 g/dL (ref 0.0–0.4)
Alpha 2: 0.7 g/dL (ref 0.4–1.0)
Beta: 0.8 g/dL (ref 0.7–1.3)
Gamma Globulin: 1.1 g/dL (ref 0.4–1.8)
Globulin, Total: 2.8 g/dL (ref 2.2–3.9)
M-Spike, %: 0.3 g/dL — ABNORMAL HIGH
Total Protein: 6.7 g/dL (ref 6.0–8.5)

## 2021-06-24 NOTE — Progress Notes (Signed)
Message left for patient to return my call.  

## 2021-07-05 ENCOUNTER — Other Ambulatory Visit (HOSPITAL_COMMUNITY): Payer: Medicare Other

## 2021-07-05 DIAGNOSIS — C44729 Squamous cell carcinoma of skin of left lower limb, including hip: Secondary | ICD-10-CM | POA: Diagnosis not present

## 2021-07-08 ENCOUNTER — Ambulatory Visit (HOSPITAL_COMMUNITY)
Admission: RE | Admit: 2021-07-08 | Discharge: 2021-07-08 | Disposition: A | Discharge status: Home / Self Care | Payer: Medicare Other | Source: Ambulatory Visit | Attending: Nurse Practitioner | Admitting: Nurse Practitioner

## 2021-07-08 DIAGNOSIS — I4891 Unspecified atrial fibrillation: Secondary | ICD-10-CM | POA: Diagnosis not present

## 2021-07-08 DIAGNOSIS — I4892 Unspecified atrial flutter: Secondary | ICD-10-CM | POA: Insufficient documentation

## 2021-07-08 DIAGNOSIS — I10 Essential (primary) hypertension: Secondary | ICD-10-CM | POA: Diagnosis not present

## 2021-07-08 DIAGNOSIS — I350 Nonrheumatic aortic (valve) stenosis: Secondary | ICD-10-CM

## 2021-07-08 DIAGNOSIS — I080 Rheumatic disorders of both mitral and aortic valves: Secondary | ICD-10-CM | POA: Insufficient documentation

## 2021-07-08 DIAGNOSIS — E785 Hyperlipidemia, unspecified: Secondary | ICD-10-CM | POA: Insufficient documentation

## 2021-07-08 LAB — ECHOCARDIOGRAM COMPLETE
AR max vel: 0.79 cm2
AV Area VTI: 0.79 cm2
AV Area mean vel: 0.76 cm2
AV Mean grad: 32 mmHg
AV Peak grad: 59.3 mmHg
Ao pk vel: 3.85 m/s
Calc EF: 65.7 %
S' Lateral: 2.2 cm
Single Plane A2C EF: 68.2 %
Single Plane A4C EF: 60.5 %

## 2021-07-08 NOTE — Progress Notes (Signed)
?  Echocardiogram ?2D Echocardiogram has been performed. ? ?Chad Morrison ?07/08/2021, 12:39 PM ?

## 2021-07-17 ENCOUNTER — Other Ambulatory Visit (INDEPENDENT_AMBULATORY_CARE_PROVIDER_SITE_OTHER): Payer: Self-pay | Admitting: Ophthalmology

## 2021-07-23 ENCOUNTER — Ambulatory Visit: Payer: Medicare Other | Admitting: Internal Medicine

## 2021-07-23 ENCOUNTER — Ambulatory Visit (INDEPENDENT_AMBULATORY_CARE_PROVIDER_SITE_OTHER): Payer: Medicare Other | Admitting: Internal Medicine

## 2021-07-23 ENCOUNTER — Encounter: Payer: Self-pay | Admitting: Internal Medicine

## 2021-07-23 VITALS — BP 148/70 | Ht 69.0 in | Wt 152.0 lb

## 2021-07-23 DIAGNOSIS — I4891 Unspecified atrial fibrillation: Secondary | ICD-10-CM

## 2021-07-23 DIAGNOSIS — I350 Nonrheumatic aortic (valve) stenosis: Secondary | ICD-10-CM | POA: Diagnosis not present

## 2021-07-23 NOTE — Progress Notes (Signed)
Patient ID: Chad Morrison MRN: [id number] DOB/AGE: 06-18-1932 88 y.o.  Primary Care Physician:Tisovec, Fransico Him, MD Primary Cardiologist: Delilah Shan CARDIOVASCULAR PROBLEM LIST:   1.  Moderate to severe aortic stenosis with an aortic valve area of 0.79 cm, mean gradient of 33 mmHg, and peak velocity of 3.8 m/s; atrial fibrillation with right bundle branch block 2.  Atrial fibrillation on anticoagulation 3.  Hypertension 4.  Hyperlipidemia 5.  Wegener's granulomatosis on Fasenra immunotherapy   HISTORY OF PRESENT ILLNESS: The patient is a 88 y.o. male with the indicated medical history here for recommendations regarding his severe aortic stenosis seen on echocardiogram.  The patient was referred by nurse practitioner Caroll in the atrial fibrillation clinic the patient was seen in April of this year and was doing relatively well.  The patient is here with his wife.  He is relatively active.  He exercises at the [facility] on a regular basis using a stationary bike.  He denies any exertional dyspnea, exertional angina, presyncope or syncope.  His atrial fibrillation has not really bother him whatsoever.  He has not required emergency room visits or hospitalizations recently for any breathing difficulties.  He has had no issues with bleeding or bruising while on Eliquis.  In terms of other symptoms his Wegener's granulomatosis is very well controlled on immunotherapy.  He is been retired for a long time.  He moved to [place] some years ago because his son is here.  He used to live in [place] since the 90s.  He does not smoke or drink.  He sees a Pharmacist, community on a regular basis and reports very good dental health.   Past Medical History:  Diagnosis Date   Anxiety    Asthma    Asthmatic bronchitis    Atrial flutter ([place])    chads2vasc score is 3   Ehrlichiosis 1027   GERD (gastroesophageal reflux disease)    Granulomatosis with polyangiitis (HCC)    HTN (hypertension)     Hyperlipidemia    Nuclear sclerotic cataract of left eye 06/04/2019   Cataract surgery completed March 2023, Dr. [name]   Paroxysmal atrial fibrillation [facility])    Prostate cancer ([place])    Seasonal allergies     Past Surgical History:  Procedure Laterality Date   BASAL CELL CARCINOMA EXCISION  2013   EYELID   HERNIA REPAIR Bilateral 2000, 2002   KNEE ARTHROSCOPY  2000   prostectomy  [place]    Family History  Problem Relation Age of Onset   Asthma Mother 56   Allergies Mother    Ulcers Mother    CVA Father 50   Heart Problems Father    Other Father        NEUROLOGIC ISSUES    Other Brother 79       BICYCLE ACCIDENT   Migraines Son    Diabetes Mellitus I Son     Social History   Socioeconomic History   Marital status: Married    Spouse name: [name]   Number of children: 2   Years of education: Not on file   Highest education level: Not on file  Occupational History   Occupation: RETIRED  Tobacco Use   Smoking status: Former    Packs/day: 0.50    Years: 4.00    Pack years: 2.00    Types: Cigarettes    Quit date: 02/28/1961    Years since quitting: 60.4   Smokeless tobacco:  Never  Vaping Use   Vaping Use: Never used  Substance and Sexual Activity   Alcohol use: Yes    Alcohol/week: 2.0 - 3.0 standard drinks    Types: 2 - 3 Standard drinks or equivalent per week   Drug use: No   Sexual activity: Not on file  Other Topics Concern   Not on file  Social History Narrative   Pt recently moved to [place] from [place] to be near his son.   Retired.   Social Determinants of Health   Financial Resource Strain: Not on file  Food Insecurity: Not on file  Transportation Needs: Not on file  Physical Activity: Not on file  Stress: Not on file  Social Connections: Not on file  Intimate Partner Violence: Not on file     Prior to Admission medications   Medication Sig Start Date End Date Taking? Authorizing Provider  albuterol  (VENTOLIN HFA) 108 (90 Base) MCG/ACT inhaler Inhale 2 puffs into the lungs every 4 (four) hours as needed for wheezing or shortness of breath. 06/22/21   Kozlow, Donnamarie Poag, MD  ALPRAZolam Duanne Moron) 0.25 MG tablet Take 0.25 mg by mouth at bedtime as needed for sleep.     [provider]  Ascorbic Acid (VITAMIN C PO) Take 1 tablet by mouth every morning. Not sure the dosage    [provider]  azelastine (ASTELIN) 0.1 % nasal spray Place 1 spray into both nostrils 2 (two) times daily. 03/31/21   [provider]  budesonide-formoterol (SYMBICORT) 160-4.5 MCG/ACT inhaler Inhale 2 puffs into the lungs in the morning and at bedtime. 06/22/21   Kozlow, Donnamarie Poag, MD  Calcium Carbonate-Vitamin D (CALCIUM-VITAMIN D) 600-125 MG-UNIT TABS Take 1 tablet by mouth 2 (two) times daily.     [provider]  diltiazem (CARDIZEM CD) 180 MG 24 hr capsule TAKE 1 CAPSULE EVERY       MORNING AND AT BEDTIME 02/23/21   Fenton, Clint R, PA  ELIQUIS 5 MG TABS tablet TAKE 1 TABLET TWICE A DAY 02/23/21   Fenton, Clint R, PA  EPINEPHrine 0.3 mg/0.3 mL IJ SOAJ injection SMARTSIG:0.3 Milligram(s) IM Once 06/22/21   Kozlow, Donnamarie Poag, MD  esomeprazole (NEXIUM) 40 MG capsule Take 1 capsule (40 mg total) by mouth 2 (two) times daily. 06/22/21   Kozlow, Donnamarie Poag, MD  Ferrous Sulfate (SLOW FE PO) Take 1 tablet by mouth every morning.    [provider]  fluorouracil (EFUDEX) 5 % cream SMARTSIG:sparingly Topical Twice Daily 05/18/21   [provider]  levocetirizine (XYZAL) 5 MG tablet Take 1 tablet (5 mg total) by mouth 2 (two) times daily as needed for allergies (Can take an extra dose during flare ups.). 06/22/21   Kozlow, Donnamarie Poag, MD  levothyroxine (SYNTHROID, LEVOTHROID) 75 MCG tablet Take 75 mcg by mouth daily before breakfast.    [provider]  montelukast (SINGULAIR) 10 MG tablet Take 1 tablet (10 mg total) by mouth at bedtime. 06/22/21   Kozlow, Donnamarie Poag, MD  Multiple Vitamin (MULTIVITAMIN  WITH MINERALS) TABS tablet Take 1 tablet daily by mouth.    [provider]  Multiple Vitamins-Minerals (OCUVITE PRESERVISION PO) Take 1 tablet 2 (two) times daily by mouth.    [provider]  Polyethyl Glycol-Propyl Glycol (SYSTANE OP) Apply 1 drop to eye every morning.    [provider]  PROLENSA 0.07 % SOLN INSTILL 1 DROP INTO THE    RIGHT EYE DAILY 07/19/21   Rankin, Clent Demark,  MD  Propylene Glycol 0.6 % SOLN Apply 1 drop to eye in the morning.    [provider]  rosuvastatin (CRESTOR) 5 MG tablet TAKE 1 TABLET DAILY 01/19/21   Fenton, Clint R, PA    Allergies  Allergen Reactions   Other Other (See Comments)    Latex Gloves   Penicillins Hives    Has patient had a PCN reaction causing immediate rash, facial/tongue/throat swelling, SOB or lightheadedness with hypotension: No Has patient had a PCN reaction causing severe rash involving mucus membranes or skin necrosis: No Has patient had a PCN reaction that required hospitalization: No Has patient had a PCN reaction occurring within the last 10 years: Yes If all of the above answers are "NO", then may proceed with Cephalosporin use.    Sulfa Antibiotics Other (See Comments)    Unknown to patient    Sulfamethoxazole-Trimethoprim Other (See Comments)   Latex Hives and Rash    REVIEW OF SYSTEMS:  General: no fevers/chills/night sweats Eyes: no blurry vision, diplopia, or amaurosis ENT: no sore throat or hearing loss Resp: no cough, wheezing, or hemoptysis CV: no edema or palpitations GI: no abdominal pain, nausea, vomiting, diarrhea, or constipation GU: no dysuria, frequency, or hematuria Skin: no rash Neuro: no headache, numbness, tingling, or weakness of extremities Musculoskeletal: no joint pain or swelling Heme: no bleeding, DVT, or easy bruising Endo: no polydipsia or polyuria  Ht '5\' 9"'$  (1.753 m)   Wt 152 lb (68.9 kg)   BMI 22.45 kg/m   PHYSICAL EXAM: GEN:  AO x 3 in no acute  distress HEENT: normal Dentition: Normal Neck: JVP normal. +2 carotid upstrokes without bruits. No thyromegaly. Lungs: equal expansion, clear bilaterally CV: Apex is discrete and nondisplaced, RRR with 3/6 SEM Abd: soft, non-tender, non-distended; no bruit; positive bowel sounds Ext: no edema, ecchymoses, or cyanosis Vascular: 2+ femoral pulses, 2+ radial pulses       Skin: warm and dry without rash Neuro: CN II-XII grossly intact; motor and sensory grossly intact    DATA AND STUDIES:  EKG: Atrial fibrillation with a right bundle branch block  2D ECHO: Echocardiogram demonstrates moderate to severe aortic stenosis with a heavily calcified valve with a mean gradient of 33 mmHg with no significant other valvular issues.  CARDIAC CATH: n/a  STS RISK CALCULATOR: pending  NHYA CLASS: 1    ASSESSMENT AND PLAN:   Nonrheumatic aortic valve stenosis  Atrial fibrillation, unspecified type [facility])  The patient is relatively asymptomatic in regards to his aortic stenosis.  This may be difficult to gauge since he does have issues with walking due to chronic leg pain.  At this point in time he has not developed symptoms that I can attribute to his aortic stenosis.  We had a long discussion about the pathophysiology of aortic stenosis and what he may expect later.  He is clear at this time his aortic stenosis is not limiting his lifestyle in any way.  I think it makes sense to monitor him for now.  We did speak about the evaluation process including coronary angiography, CTA, and consultation with cardiothoracic surgery.  I will see him back in 3 months to evaluate further.  I did tell him to contact our office if he develops any presyncope, syncope, exertional dyspnea, or exertional angina.  I have personally reviewed the patients imaging data as summarized above.  I have reviewed the natural history of aortic stenosis with the patient and family members who are present today. We  have discussed  the limitations of medical therapy and the poor prognosis associated with symptomatic aortic stenosis. We have also reviewed potential treatment options, including palliative medical therapy, conventional surgical aortic valve replacement, and transcatheter aortic valve replacement. We discussed treatment options in the context of this patient's specific comorbid medical conditions.   All of the patient's questions were answered today. Will make further recommendations based on the results of studies outlined above.   Total time spent with patient today 60 minutes. This includes reviewing records, evaluating the patient and coordinating care.   Early Osmond, MD  07/23/2021 2:16 PM    [facility] [address], [place], [place]  [address] Phone: [phone]; Fax: [phone]

## 2021-07-23 NOTE — Patient Instructions (Signed)
Medication Instructions:  No changes *If you need a refill on your cardiac medications before your next appointment, please call your pharmacy*   Lab Work: none   Testing/Procedures: none   Follow-Up: In 3 months with Dr. Ali Lowe   Important Information About Sugar

## 2021-07-30 DIAGNOSIS — M859 Disorder of bone density and structure, unspecified: Secondary | ICD-10-CM | POA: Diagnosis not present

## 2021-07-30 DIAGNOSIS — I700 Atherosclerosis of aorta: Secondary | ICD-10-CM | POA: Diagnosis not present

## 2021-07-30 DIAGNOSIS — E7800 Pure hypercholesterolemia, unspecified: Secondary | ICD-10-CM | POA: Diagnosis not present

## 2021-07-30 DIAGNOSIS — R946 Abnormal results of thyroid function studies: Secondary | ICD-10-CM | POA: Diagnosis not present

## 2021-07-30 DIAGNOSIS — Z125 Encounter for screening for malignant neoplasm of prostate: Secondary | ICD-10-CM | POA: Diagnosis not present

## 2021-08-04 ENCOUNTER — Encounter (INDEPENDENT_AMBULATORY_CARE_PROVIDER_SITE_OTHER): Payer: Self-pay

## 2021-08-04 ENCOUNTER — Ambulatory Visit (INDEPENDENT_AMBULATORY_CARE_PROVIDER_SITE_OTHER): Payer: Medicare Other | Admitting: Ophthalmology

## 2021-08-04 DIAGNOSIS — H35371 Puckering of macula, right eye: Secondary | ICD-10-CM

## 2021-08-04 MED ORDER — PREDNISOLONE ACETATE 1 % OP SUSP: 1.0000 [drp] | Freq: Four times a day (QID) | OPHTHALMIC | 0 refills | Status: DC

## 2021-08-04 MED ORDER — OFLOXACIN 0.3 % OP SOLN: 1.0000 [drp] | Freq: Four times a day (QID) | OPHTHALMIC | 0 refills | Status: AC

## 2021-08-04 NOTE — Progress Notes (Signed)
08/04/2021     CHIEF COMPLAINT Patient presents for  Chief Complaint  Patient presents with   Retina Evaluation      HISTORY OF PRESENT ILLNESS: Chad Morrison is a 88 y.o. male who presents to the clinic today for:     Referring physician: Haywood Pao, [place],  [place] [address]  HISTORICAL INFORMATION:   Selected notes from the [facility]: Current Outpatient Medications (Ophthalmic Drugs)  Medication Sig   [START ON 08/09/2021] ofloxacin (OCUFLOX) 0.3 % ophthalmic solution Place 1 drop into the right eye in the morning, at noon, in the evening, and at bedtime for 25 days.   prednisoLONE acetate (PRED FORTE) 1 % ophthalmic suspension Place 1 drop into the right eye 4 (four) times daily.   Polyethyl Glycol-Propyl Glycol (SYSTANE OP) Apply 1 drop to eye every morning.   PROLENSA 0.07 % SOLN INSTILL 1 DROP INTO THE    RIGHT EYE DAILY   Propylene Glycol 0.6 % SOLN Apply 1 drop to eye in the morning.   No current facility-administered medications for this visit. (Ophthalmic Drugs)   Current Outpatient Medications (Other)  Medication Sig   albuterol (VENTOLIN HFA) 108 (90 Base) MCG/ACT inhaler Inhale 2 puffs into the lungs every 4 (four) hours as needed for wheezing or shortness of breath.   ALPRAZolam (XANAX) 0.25 MG tablet Take 0.25 mg by mouth at bedtime as needed for sleep.    Ascorbic Acid (VITAMIN C PO) Take 1 tablet by mouth every morning. Not sure the dosage   azelastine (ASTELIN) 0.1 % nasal spray Place 1 spray into both nostrils 2 (two) times daily.   budesonide-formoterol (SYMBICORT) 160-4.5 MCG/ACT inhaler Inhale 2 puffs into the lungs in the morning and at bedtime.   Calcium Carbonate-Vitamin D (CALCIUM-VITAMIN D) 600-125 MG-UNIT TABS Take 1 tablet by mouth 2 (two) times daily.    diltiazem (CARDIZEM CD) 180 MG 24 hr capsule TAKE 1 CAPSULE EVERY       MORNING AND AT BEDTIME   ELIQUIS 5 MG TABS tablet TAKE  1 TABLET TWICE A DAY   EPINEPHrine 0.3 mg/0.3 mL IJ SOAJ injection SMARTSIG:0.3 Milligram(s) IM Once   esomeprazole (NEXIUM) 40 MG capsule Take 1 capsule (40 mg total) by mouth 2 (two) times daily.   Ferrous Sulfate (SLOW FE PO) Take 1 tablet by mouth every morning.   fluorouracil (EFUDEX) 5 % cream SMARTSIG:sparingly Topical Twice Daily   levocetirizine (XYZAL) 5 MG tablet Take 1 tablet (5 mg total) by mouth 2 (two) times daily as needed for allergies (Can take an extra dose during flare ups.).   levothyroxine (SYNTHROID, LEVOTHROID) 75 MCG tablet Take 75 mcg by mouth daily before breakfast.   montelukast (SINGULAIR) 10 MG tablet Take 1 tablet (10 mg total) by mouth at bedtime.   Multiple Vitamin (MULTIVITAMIN WITH MINERALS) TABS tablet Take 1 tablet daily by mouth.   Multiple Vitamins-Minerals (OCUVITE PRESERVISION PO) Take 1 tablet 2 (two) times daily by mouth.   rosuvastatin (CRESTOR) 5 MG tablet TAKE 1 TABLET DAILY   Current Facility-Administered Medications (Other)  Medication Route   Benralizumab SOSY 30 mg Subcutaneous      REVIEW OF SYSTEMS: ROS   Negative for: Constitutional, Gastrointestinal, Neurological, Skin, Genitourinary, Musculoskeletal, HENT, Endocrine, Cardiovascular, Eyes, Respiratory, Psychiatric, Allergic/Imm, Heme/Lymph Last edited by Hurman Horn, MD on 08/04/2021 11:23 AM.       ALLERGIES Allergies  Allergen Reactions  Other Other (See Comments)    Latex Gloves   Penicillins Hives    Has patient had a PCN reaction causing immediate rash, facial/tongue/throat swelling, SOB or lightheadedness with hypotension: No Has patient had a PCN reaction causing severe rash involving mucus membranes or skin necrosis: No Has patient had a PCN reaction that required hospitalization: No Has patient had a PCN reaction occurring within the last 10 years: Yes If all of the above answers are "NO", then may proceed with Cephalosporin use.    Sulfa Antibiotics Other (See  Comments)    Unknown to patient    Sulfamethoxazole-Trimethoprim Other (See Comments)   Latex Hives and Rash    PAST MEDICAL HISTORY Past Medical History:  Diagnosis Date   Anxiety    Asthma    Asthmatic bronchitis    Atrial flutter ([place])    chads2vasc score is 3   Ehrlichiosis 9326   GERD (gastroesophageal reflux disease)    Granulomatosis with polyangiitis (HCC)    HTN (hypertension)    Hyperlipidemia    Nuclear sclerotic cataract of left eye 06/04/2019   Cataract surgery completed March 2023, Dr. [name]   Paroxysmal atrial fibrillation [facility])    Prostate cancer ([place] [facility])    Seasonal allergies    Past Surgical History:  Procedure Laterality Date   BASAL CELL CARCINOMA EXCISION  2013   EYELID   HERNIA REPAIR Bilateral 2000, 2002   KNEE ARTHROSCOPY  2000   prostectomy  1995   RADICAL   TONSILLECTOMY  1940    FAMILY HISTORY Family History  Problem Relation Age of Onset   Asthma Mother 76   Allergies Mother    Ulcers Mother    CVA Father 68   Heart Problems Father    Other Father        NEUROLOGIC ISSUES    Other Brother 76       BICYCLE ACCIDENT   Migraines Son    Diabetes Mellitus I Son     SOCIAL HISTORY Social History   Tobacco Use   Smoking status: Former    Packs/day: 0.50    Years: 4.00    Total pack years: 2.00    Types: Cigarettes    Quit date: 02/28/1961    Years since quitting: 60.4   Smokeless tobacco: Never  Vaping Use   Vaping Use: Never used  Substance Use Topics   Alcohol use: Yes    Alcohol/week: 2.0 - 3.0 standard drinks of alcohol    Types: 2 - 3 Standard drinks or equivalent per week   Drug use: No         OPHTHALMIC EXAM:  Base Eye Exam     Visual Acuity (ETDRS)       Right Left   Dist [place] 20/40 -3 20/25         Pupils       Pupils   Right PERRL   Left PERRL         Visual Fields       Left Right    Full Full         Neuro/Psych     Oriented x3: Yes   Mood/Affect: Normal            IMAGING AND  PROCEDURES  Imaging and Procedures for 08/06/21           ASSESSMENT/PLAN:  No problem-specific Assessment & Plan notes found for this encounter.      ICD-10-CM   1. Right epiretinal membrane  H35.371 CANCELED: OCT, Retina - OU - Both Eyes      1.  Discussion of potential risk benefits reviewed with the patient and OCT displayed with a clear demonstration of the events to take place at the time of surgery with vitrectomy membrane peel to subsequently over the weeks and months to follow-up to allow for the center of the vision hopefully to resume a more natural parents and the subfoveal deposition of material to diminish  2.  Moreover the anterior chamber washout is to simply confirm there is no vitreous remnants in the anterior chamber triggering iris chafe and potentially secondary CME  3.  Ophthalmic Meds Ordered this visit:  Meds ordered this encounter  Medications   ofloxacin (OCUFLOX) 0.3 % ophthalmic solution    Sig: Place 1 drop into the right eye in the morning, at noon, in the evening, and at bedtime for 25 days.    Dispense:  5 mL    Refill:  0   prednisoLONE acetate (PRED FORTE) 1 % ophthalmic suspension    Sig: Place 1 drop into the right eye 4 (four) times daily.    Dispense:  10 mL    Refill:  0       Return for Vitrectomy membrane peel right [id number].  Patient Instructions  1.  GIVE PREOPERATIVE EYE MEDICATION TO PATIENT TO TAKE HOME, AND DO NOT USE UNTIL POSTOPERATIVE VISIT THE NEXT DAY IN DR [facility] OFFICE UNLESS INSTRUCTED BY PHYSICIAN TO START THE NEXT DAY.   POSITIONS:  (if gas bubble placed into the eye)  A.  Do not sleep or rest on back  B.  Do not travel by airplane, to the mountains, or elevation above 2000 feet  C.    The patient understands the risks of anesthesia including but not limited to the rare occurrence of death. The patient also understands risks and benefits of the planned surgical procedure include but are not limited to  hemorrhage, infection, scarring, loss of vision, progression of disease despite intervention, and need for another procedure.    Explained the diagnoses, plan, and follow up with the patient and they expressed understanding.  Patient expressed understanding of the importance of proper follow up care.   Clent Demark Lillyahna Hemberger M.D. Diseases & Surgery of the Retina and Vitreous [facility] 08/06/21     Abbreviations: M myopia (nearsighted); A astigmatism; H hyperopia (farsighted); P presbyopia; Mrx spectacle prescription;  CTL contact lenses; OD right eye; OS left eye; OU both eyes  XT exotropia; ET esotropia; PEK punctate epithelial keratitis; PEE punctate epithelial erosions; DES dry eye syndrome; MGD meibomian gland dysfunction; ATs artificial tears; PFAT's preservative free artificial tears; [place] nuclear sclerotic cataract; PSC posterior subcapsular cataract; ERM epi-retinal membrane; PVD posterior vitreous detachment; RD retinal detachment; DM diabetes mellitus; DR diabetic retinopathy; NPDR non-proliferative diabetic retinopathy; PDR proliferative diabetic retinopathy; CSME clinically significant macular edema; DME diabetic macular edema; dbh dot blot hemorrhages; CWS cotton wool spot; POAG primary open angle glaucoma; C/D cup-to-disc ratio; HVF humphrey visual field; GVF goldmann visual field; OCT optical coherence tomography; IOP intraocular pressure; BRVO Branch retinal vein occlusion; CRVO central retinal vein occlusion; CRAO central retinal artery occlusion; BRAO branch retinal artery occlusion; RT retinal tear; SB scleral buckle; PPV pars plana vitrectomy; VH Vitreous hemorrhage; PRP panretinal laser photocoagulation; IVK intravitreal kenalog; VMT vitreomacular traction; MH Macular hole;  NVD neovascularization of the disc; NVE neovascularization elsewhere; AREDS age related eye disease study; ARMD age related macular degeneration; POAG primary  open angle glaucoma; EBMD  epithelial/anterior basement membrane dystrophy; ACIOL anterior chamber intraocular lens; IOL intraocular lens; PCIOL posterior chamber intraocular lens; Phaco/IOL phacoemulsification with intraocular lens placement; [place] photorefractive keratectomy; LASIK laser assisted in situ keratomileusis; HTN hypertension; DM diabetes mellitus; COPD chronic obstructive pulmonary disease

## 2021-08-05 ENCOUNTER — Telehealth: Payer: Self-pay | Admitting: Internal Medicine

## 2021-08-05 NOTE — Telephone Encounter (Signed)
   Pre-operative Risk Assessment    Patient Name: Chad Morrison  DOB: 1932-10-09 MRN: [id number]      Request for Surgical Clearance    Procedure:  Vitrectomy 25 Gauge Membrane Peel -  Right Eye    Date of Surgery:  Clearance 08/11/21                                 Surgeon:  Dr. Deloria Lair Surgeon's Group or Practice Name:  [facility] Phone number:  [phone] Fax number:  [phone]   Type of Clearance Requested:   - Pharmacy:  Hold Apixaban (Eliquis) Stop on 08/07/21   Type of Anesthesia:  Local    Additional requests/questions:  Please advise surgeon/provider what medications should be held.  Signed, Belisicia T Harris   08/05/2021, 3:42 PM

## 2021-08-06 DIAGNOSIS — E7800 Pure hypercholesterolemia, unspecified: Secondary | ICD-10-CM | POA: Diagnosis not present

## 2021-08-06 DIAGNOSIS — I350 Nonrheumatic aortic (valve) stenosis: Secondary | ICD-10-CM | POA: Diagnosis not present

## 2021-08-06 DIAGNOSIS — Z8546 Personal history of malignant neoplasm of prostate: Secondary | ICD-10-CM | POA: Diagnosis not present

## 2021-08-06 DIAGNOSIS — Z0000 Encounter for general adult medical examination without abnormal findings: Secondary | ICD-10-CM | POA: Diagnosis not present

## 2021-08-06 DIAGNOSIS — J4540 Moderate persistent asthma, uncomplicated: Secondary | ICD-10-CM | POA: Diagnosis not present

## 2021-08-06 DIAGNOSIS — M3130 Wegener's granulomatosis without renal involvement: Secondary | ICD-10-CM | POA: Diagnosis not present

## 2021-08-06 DIAGNOSIS — I700 Atherosclerosis of aorta: Secondary | ICD-10-CM | POA: Diagnosis not present

## 2021-08-06 DIAGNOSIS — J849 Interstitial pulmonary disease, unspecified: Secondary | ICD-10-CM | POA: Diagnosis not present

## 2021-08-06 DIAGNOSIS — D8989 Other specified disorders involving the immune mechanism, not elsewhere classified: Secondary | ICD-10-CM | POA: Diagnosis not present

## 2021-08-06 DIAGNOSIS — Z7901 Long term (current) use of anticoagulants: Secondary | ICD-10-CM | POA: Diagnosis not present

## 2021-08-06 DIAGNOSIS — I4892 Unspecified atrial flutter: Secondary | ICD-10-CM | POA: Diagnosis not present

## 2021-08-06 DIAGNOSIS — Z1331 Encounter for screening for depression: Secondary | ICD-10-CM | POA: Diagnosis not present

## 2021-08-06 DIAGNOSIS — M8580 Other specified disorders of bone density and structure, unspecified site: Secondary | ICD-10-CM | POA: Diagnosis not present

## 2021-08-06 DIAGNOSIS — Z1339 Encounter for screening examination for other mental health and behavioral disorders: Secondary | ICD-10-CM | POA: Diagnosis not present

## 2021-08-06 NOTE — Telephone Encounter (Addendum)
    Patient Name: Chad Morrison  DOB: [birth year] MRN: [id number]  Primary Cardiologist: None  Pharmacist have reviewed the patient's past medical history, labs, and current medications as part of pre-operative protocol coverage.   The following recommendations have been made:  Patient with diagnosis of A Fib on Eliquis for anticoagulation.     Procedure: Vitrectomy 25 Gauge Membrane Peel -  Right Eye   Date of procedure: 08/11/21     CHA2DS2-VASc Score = 4  This indicates a 4.8% annual risk of stroke. The patient's score is based upon: CHF History: 0 HTN History: 1 Diabetes History: 0 Stroke History: 0 Vascular Disease History: 1 (seen on chest CT) Age Score: 2 Gender Score: 0       CrCl 42 mL/min Platelet count 201K   Per office protocol, patient can hold Eliquis for 3 days prior to procedure. Please resume Eliquis as soon as possible postprocedure, at the discretion of the surgeon.  Patient was notified of the recommendations via telephone.  I will route this recommendation to the requesting party via Epic fax function and remove from pre-op pool.  Please call with questions.  Lenna Sciara, NP 08/06/2021, 5:03 PM

## 2021-08-06 NOTE — Telephone Encounter (Signed)
Patient with diagnosis of A Fib on Eliquis for anticoagulation.    Procedure: Vitrectomy 25 Gauge Membrane Peel -  Right Eye   Date of procedure: 08/11/21   CHA2DS2-VASc Score = 4  This indicates a 4.8% annual risk of stroke. The patient's score is based upon: CHF History: 0 HTN History: 1 Diabetes History: 0 Stroke History: 0 Vascular Disease History: 1 (seen on chest CT) Age Score: 2 Gender Score: 0     CrCl 42 mL/min Platelet count 201K  Per office protocol, patient can hold Eliquis for 3 days prior to procedure.

## 2021-08-06 NOTE — Patient Instructions (Signed)
1.  GIVE PREOPERATIVE EYE MEDICATION TO PATIENT TO TAKE HOME, AND DO NOT USE UNTIL POSTOPERATIVE VISIT THE NEXT DAY IN DR [facility] OFFICE UNLESS INSTRUCTED BY PHYSICIAN TO START THE NEXT DAY.   POSITIONS:  (if gas bubble placed into the eye)  A.  Do not sleep or rest on back  B.  Do not travel by airplane, to the mountains, or elevation above 2000 feet  C.    The patient understands the risks of anesthesia including but not limited to the rare occurrence of death. The patient also understands risks and benefits of the planned surgical procedure include but are not limited to hemorrhage, infection, scarring, loss of vision, progression of disease despite intervention, and need for another procedure.

## 2021-08-06 NOTE — Telephone Encounter (Signed)
[facility] calling to see if someone will be reaching out to this patient to let them know they can stop Eliquis on 6/10.  Their office is closed today, but wanted to f/u and make sure the patient is aware that this med can be stopped.  Requesting the patient be called.

## 2021-08-10 ENCOUNTER — Ambulatory Visit (INDEPENDENT_AMBULATORY_CARE_PROVIDER_SITE_OTHER): Payer: Medicare Other | Admitting: Ophthalmology

## 2021-08-10 ENCOUNTER — Encounter (INDEPENDENT_AMBULATORY_CARE_PROVIDER_SITE_OTHER): Payer: Self-pay | Admitting: Ophthalmology

## 2021-08-10 ENCOUNTER — Encounter (INDEPENDENT_AMBULATORY_CARE_PROVIDER_SITE_OTHER): Payer: Medicare Other | Admitting: Ophthalmology

## 2021-08-10 DIAGNOSIS — H353211 Exudative age-related macular degeneration, right eye, with active choroidal neovascularization: Secondary | ICD-10-CM | POA: Diagnosis not present

## 2021-08-10 DIAGNOSIS — H04122 Dry eye syndrome of left lacrimal gland: Secondary | ICD-10-CM | POA: Diagnosis not present

## 2021-08-10 DIAGNOSIS — H35351 Cystoid macular degeneration, right eye: Secondary | ICD-10-CM | POA: Diagnosis not present

## 2021-08-10 DIAGNOSIS — H35371 Puckering of macula, right eye: Secondary | ICD-10-CM

## 2021-08-10 NOTE — Progress Notes (Signed)
08/10/2021     CHIEF COMPLAINT Patient presents for  Chief Complaint  Patient presents with   Eye Problem      HISTORY OF PRESENT ILLNESS: Chad Morrison is a 88 y.o. male who presents to the clinic today for:   HPI   WIP- Eye complaint , FBS OS.  Patient reports his left eye feels like there is something in his eye. He feels like there is some pain in the left corner of his eye. He states "I am having surgery tomorrow for my right eye so I wanted to have my eye looked at to be comfortable with doing the surgery tomorrow. It is not so much a pain, just an uncomfortable feeling. It started a couple days ago." Preop drops OD Prednisolone used Monday QID OD and Ofloxacin used Monday QID OD and used both drops one time this morning OD so far, per patient. Patient uses Systane OU but has not used Systane this morning. Patient denies rubbing eye.  Patient requests OS only dilated today. Last edited by Hurman Horn, MD on 08/10/2021 11:32 AM.      Referring physician: Haywood Pao, MD [place],  [place] [address]  HISTORICAL INFORMATION:   Selected notes from the MEDICAL RECORD NUMBER       CURRENT MEDICATIONS: Current Outpatient Medications (Ophthalmic Drugs)  Medication Sig   ofloxacin (OCUFLOX) 0.3 % ophthalmic solution Place 1 drop into the right eye in the morning, at noon, in the evening, and at bedtime for 25 days.   Polyethyl Glycol-Propyl Glycol (SYSTANE OP) Apply 1 drop to eye every morning.   prednisoLONE acetate (PRED FORTE) 1 % ophthalmic suspension Place 1 drop into the right eye 4 (four) times daily.   PROLENSA 0.07 % SOLN INSTILL 1 DROP INTO THE    RIGHT EYE DAILY   Propylene Glycol 0.6 % SOLN Apply 1 drop to eye in the morning.   No current facility-administered medications for this visit. (Ophthalmic Drugs)   Current Outpatient Medications (Other)  Medication Sig   albuterol (VENTOLIN HFA) 108 (90 Base) MCG/ACT inhaler Inhale 2 puffs into  the lungs every 4 (four) hours as needed for wheezing or shortness of breath.   ALPRAZolam (XANAX) 0.25 MG tablet Take 0.25 mg by mouth at bedtime as needed for sleep.    Ascorbic Acid (VITAMIN C PO) Take 1 tablet by mouth every morning. Not sure the dosage   azelastine (ASTELIN) 0.1 % nasal spray Place 1 spray into both nostrils 2 (two) times daily.   budesonide-formoterol (SYMBICORT) 160-4.5 MCG/ACT inhaler Inhale 2 puffs into the lungs in the morning and at bedtime.   Calcium Carbonate-Vitamin D (CALCIUM-VITAMIN D) 600-125 MG-UNIT TABS Take 1 tablet by mouth 2 (two) times daily.    diltiazem (CARDIZEM CD) 180 MG 24 hr capsule TAKE 1 CAPSULE EVERY       MORNING AND AT BEDTIME   ELIQUIS 5 MG TABS tablet TAKE 1 TABLET TWICE A DAY   EPINEPHrine 0.3 mg/0.3 mL IJ SOAJ injection SMARTSIG:0.3 Milligram(s) IM Once   esomeprazole (NEXIUM) 40 MG capsule Take 1 capsule (40 mg total) by mouth 2 (two) times daily.   Ferrous Sulfate (SLOW FE PO) Take 1 tablet by mouth every morning.   fluorouracil (EFUDEX) 5 % cream SMARTSIG:sparingly Topical Twice Daily   levocetirizine (XYZAL) 5 MG tablet Take 1 tablet (5 mg total) by mouth 2 (two) times daily as needed for allergies (Can take an extra dose during flare ups.).  levothyroxine (SYNTHROID, LEVOTHROID) 75 MCG tablet Take 75 mcg by mouth daily before breakfast.   montelukast (SINGULAIR) 10 MG tablet Take 1 tablet (10 mg total) by mouth at bedtime.   Multiple Vitamin (MULTIVITAMIN WITH MINERALS) TABS tablet Take 1 tablet daily by mouth.   Multiple Vitamins-Minerals (OCUVITE PRESERVISION PO) Take 1 tablet 2 (two) times daily by mouth.   rosuvastatin (CRESTOR) 5 MG tablet TAKE 1 TABLET DAILY   Current Facility-Administered Medications (Other)  Medication Route   Benralizumab SOSY 30 mg Subcutaneous      REVIEW OF SYSTEMS: ROS   Negative for: Constitutional, Gastrointestinal, Neurological, Skin, Genitourinary, Musculoskeletal, HENT, Endocrine,  Cardiovascular, Eyes, Respiratory, Psychiatric, Allergic/Imm, Heme/Lymph Last edited by Hurman Horn, MD on 08/10/2021 11:32 AM.       ALLERGIES Allergies  Allergen Reactions   Other Other (See Comments)    Latex Gloves   Penicillins Hives    Has patient had a PCN reaction causing immediate rash, facial/tongue/throat swelling, SOB or lightheadedness with hypotension: No Has patient had a PCN reaction causing severe rash involving mucus membranes or skin necrosis: No Has patient had a PCN reaction that required hospitalization: No Has patient had a PCN reaction occurring within the last 10 years: Yes If all of the above answers are "NO", then may proceed with Cephalosporin use.    Sulfa Antibiotics Other (See Comments)    Unknown to patient    Sulfamethoxazole-Trimethoprim Other (See Comments)   Latex Hives and Rash    PAST MEDICAL HISTORY Past Medical History:  Diagnosis Date   Anxiety    Asthma    Asthmatic bronchitis    Atrial flutter ([place])    chads2vasc score is 3   Ehrlichiosis 6045   GERD (gastroesophageal reflux disease)    Granulomatosis with polyangiitis (HCC)    HTN (hypertension)    Hyperlipidemia    Nuclear sclerotic cataract of left eye 06/04/2019   Cataract surgery completed March 2023, Dr. [name]   Paroxysmal atrial fibrillation [facility])    Prostate cancer ([place])    Seasonal allergies    Past Surgical History:  Procedure Laterality Date   BASAL CELL CARCINOMA EXCISION  2013   EYELID   HERNIA REPAIR Bilateral 2000, 2002   KNEE ARTHROSCOPY  2000   prostectomy  1995   RADICAL   TONSILLECTOMY  1940    FAMILY HISTORY Family History  Problem Relation Age of Onset   Asthma Mother 37   Allergies Mother    Ulcers Mother    CVA Father 27   Heart Problems Father    Other Father        NEUROLOGIC ISSUES    Other Brother 58       BICYCLE ACCIDENT   Migraines Son    Diabetes Mellitus I Son     SOCIAL HISTORY Social History   Tobacco Use   Smoking  status: Former    Packs/day: 0.50    Years: 4.00    Total pack years: 2.00    Types: Cigarettes    Quit date: 02/28/1961    Years since quitting: 60.4   Smokeless tobacco: Never  Vaping Use   Vaping Use: Never used  Substance Use Topics   Alcohol use: Yes    Alcohol/week: 2.0 - 3.0 standard drinks of alcohol    Types: 2 - 3 Standard drinks or equivalent per week   Drug use: No         OPHTHALMIC EXAM:  Base Eye Exam  Visual Acuity (ETDRS)       Right Left   Dist [place] 20/40 20/30 -1   Dist ph [place] NI 20/25 -1         Tonometry (Tonopen, 10:39 AM)       Right Left   Pressure 16 14         Pupils       Pupils Dark Light Shape React APD   Right PERRL 3 3 Round Minimal None   Left PERRL 4 3 Round Brisk None         Extraocular Movement       Right Left    Full Full         Neuro/Psych     Oriented x3: Yes   Mood/Affect: Normal         Dilation     Left eye: 1.0% Mydriacyl, 2.5% Phenylephrine @ 10:39 AM           Slit Lamp and Fundus Exam     External Exam       Right Left   External Normal Normal         Slit Lamp Exam       Right Left   Lids/Lashes Dermatochalasis - upper lid, Irregular lid margins, but no signs of infection, no hordeolum Dermatochalasis - upper lid, Irregular lid margins, but no signs of infection, no hordeolum, no trichiasis   Conjunctiva/Sclera White and quiet White and quiet   Cornea Clear Clear   Anterior Chamber Deep and quiet has not, no remaining vitreous strand to the cornea Deep and quiet   Iris Round and reactive Round and reactive   Lens Posterior chamber intraocular lens, open PC slightly decentered superotemporal, no vitreous prolapse around the IOL Centered posterior chamber intraocular lens   Anterior Vitreous Normal Normal         Fundus Exam       Right Left   Posterior Vitreous  Normal   Disc  Normal   C/D Ratio  0.55   Macula  Hard drusen, Early age related macular degeneration    Vessels  Normal   Periphery  Normal            IMAGING AND PROCEDURES  Imaging and Procedures for 08/10/21  Color Fundus Photography Optos - OU - Both Eyes       Right Eye Progression has been stable. Macula : normal observations. Vessels : normal observations. Periphery : normal observations.   Left Eye Progression has been stable. Disc findings include normal observations. Macula : normal observations. Vessels : normal observations. Periphery : normal observations.   Notes OS OD clear media             ASSESSMENT/PLAN:  Chronically dry eyes, left Patient reports being able to blink away a fog or haze or feeling in foreign body sensation this morning is subsided as of arriving here, but no active infection OS  Cystoid macular edema of right eye Unchanged secondary to ERM      ICD-10-CM   1. Right epiretinal membrane  H35.371 Color Fundus Photography Optos - OU - Both Eyes    2. Exudative age-related macular degeneration of right eye with active choroidal neovascularization ([place])  H35.3211     3. Chronically dry eyes, left  H04.122     4. Cystoid macular edema of right eye  H35.351       1.  OU no signs of ocular or lid infection.  We will proceed with  planned surgical intervention right eye to release the vitrectomy membrane peel of the epiretinal membrane and hopefully to decrease the subfoveal nodular component affecting the quality of vision.  2.  We will also plan to perform a sweep of the anterior chamber to look for occult strands of vitreous that may be triggering iris chafe contributing to CME  3.  Ophthalmic Meds Ordered this visit:  No orders of the defined types were placed in this encounter.      Return ,, OD, SCA surgical site, for Proceed with surgical vitrectomy membrane peel right eye, anterior chamber washout,.  Patient Instructions  OD, no signs of infection  Continue with preoperative medications before surgery planned  tomorrow   Explained the diagnoses, plan, and follow up with the patient and they expressed understanding.  Patient expressed understanding of the importance of proper follow up care.   Clent Demark Jeannifer Drakeford M.D. Diseases & Surgery of the Retina and Vitreous Retina & Diabetic [place] 08/10/21     Abbreviations: M myopia (nearsighted); A astigmatism; H hyperopia (farsighted); P presbyopia; Mrx spectacle prescription;  CTL contact lenses; OD right eye; OS left eye; OU both eyes  XT exotropia; ET esotropia; PEK punctate epithelial keratitis; PEE punctate epithelial erosions; DES dry eye syndrome; MGD meibomian gland dysfunction; ATs artificial tears; PFAT's preservative free artificial tears; [place] nuclear sclerotic cataract; PSC posterior subcapsular cataract; ERM epi-retinal membrane; PVD posterior vitreous detachment; RD retinal detachment; DM diabetes mellitus; DR diabetic retinopathy; NPDR non-proliferative diabetic retinopathy; PDR proliferative diabetic retinopathy; CSME clinically significant macular edema; DME diabetic macular edema; dbh dot blot hemorrhages; CWS cotton wool spot; POAG primary open angle glaucoma; C/D cup-to-disc ratio; HVF humphrey visual field; GVF goldmann visual field; OCT optical coherence tomography; IOP intraocular pressure; BRVO Branch retinal vein occlusion; CRVO central retinal vein occlusion; CRAO central retinal artery occlusion; BRAO branch retinal artery occlusion; RT retinal tear; SB scleral buckle; PPV pars plana vitrectomy; VH Vitreous hemorrhage; PRP panretinal laser photocoagulation; IVK intravitreal kenalog; VMT vitreomacular traction; MH Macular hole;  NVD neovascularization of the disc; NVE neovascularization elsewhere; AREDS age related eye disease study; ARMD age related macular degeneration; POAG primary open angle glaucoma; EBMD epithelial/anterior basement membrane dystrophy; ACIOL anterior chamber intraocular lens; IOL intraocular lens; PCIOL posterior  chamber intraocular lens; Phaco/IOL phacoemulsification with intraocular lens placement; [place] photorefractive keratectomy; LASIK laser assisted in situ keratomileusis; HTN hypertension; DM diabetes mellitus; COPD chronic obstructive pulmonary disease

## 2021-08-10 NOTE — Assessment & Plan Note (Signed)
Unchanged secondary to ERM

## 2021-08-10 NOTE — Assessment & Plan Note (Signed)
Patient reports being able to blink away a fog or haze or feeling in foreign body sensation this morning is subsided as of arriving here, but no active infection OS

## 2021-08-10 NOTE — Patient Instructions (Signed)
OD, no signs of infection  Continue with preoperative medications before surgery planned tomorrow

## 2021-08-11 ENCOUNTER — Encounter (AMBULATORY_SURGERY_CENTER): Payer: Medicare Other | Admitting: Ophthalmology

## 2021-08-11 DIAGNOSIS — H35371 Puckering of macula, right eye: Secondary | ICD-10-CM

## 2021-08-11 DIAGNOSIS — H4301 Vitreous prolapse, right eye: Secondary | ICD-10-CM | POA: Diagnosis not present

## 2021-08-12 ENCOUNTER — Encounter (INDEPENDENT_AMBULATORY_CARE_PROVIDER_SITE_OTHER): Payer: Self-pay | Admitting: Ophthalmology

## 2021-08-12 ENCOUNTER — Ambulatory Visit (INDEPENDENT_AMBULATORY_CARE_PROVIDER_SITE_OTHER): Payer: Medicare Other | Admitting: Ophthalmology

## 2021-08-12 ENCOUNTER — Encounter (INDEPENDENT_AMBULATORY_CARE_PROVIDER_SITE_OTHER): Payer: Medicare Other | Admitting: Ophthalmology

## 2021-08-12 DIAGNOSIS — H35371 Puckering of macula, right eye: Secondary | ICD-10-CM

## 2021-08-12 DIAGNOSIS — H35351 Cystoid macular degeneration, right eye: Secondary | ICD-10-CM

## 2021-08-12 NOTE — Progress Notes (Signed)
08/12/2021     CHIEF COMPLAINT Patient presents for  Chief Complaint  Patient presents with   Post-op Follow-up      HISTORY OF PRESENT ILLNESS: Chad Morrison is a 88 y.o. male who presents to the clinic today for:   HPI     Post-op Follow-up           Laterality: right eye   Discomfort: pain and tearing.  Negative for itching, foreign body sensation, discharge and floaters   Vision: is blurred at distance         Comments   1 Day PO OD Sx 08/11/2021, vitrectomy membrane peel right eye, anterior chamber washout. Patient reports vision is blurred, worse than last visit. Patient reports aching in the right eye and around the eye, he has been taking Tylenol. Patient will resume Ofloxacin and Prednisolone QID OD. Patient also uses Prolensa QD OD.      Last edited by Laurin Coder on 08/12/2021 10:20 AM.      Referring physician: Haywood Pao, MD [place],   [id number]  HISTORICAL INFORMATION:   Selected notes from the MEDICAL RECORD NUMBER       CURRENT MEDICATIONS: Current Outpatient Medications (Ophthalmic Drugs)  Medication Sig   ofloxacin (OCUFLOX) 0.3 % ophthalmic solution Place 1 drop into the right eye in the morning, at noon, in the evening, and at bedtime for 25 days.   Polyethyl Glycol-Propyl Glycol (SYSTANE OP) Apply 1 drop to eye every morning.   prednisoLONE acetate (PRED FORTE) 1 % ophthalmic suspension Place 1 drop into the right eye 4 (four) times daily.   PROLENSA 0.07 % SOLN INSTILL 1 DROP INTO THE    RIGHT EYE DAILY   Propylene Glycol 0.6 % SOLN Apply 1 drop to eye in the morning.   No current facility-administered medications for this visit. (Ophthalmic Drugs)   Current Outpatient Medications (Other)  Medication Sig   albuterol (VENTOLIN HFA) 108 (90 Base) MCG/ACT inhaler Inhale 2 puffs into the lungs every 4 (four) hours as needed for wheezing or shortness of breath.   ALPRAZolam (XANAX) 0.25 MG tablet Take 0.25  mg by mouth at bedtime as needed for sleep.    Ascorbic Acid (VITAMIN C PO) Take 1 tablet by mouth every morning. Not sure the dosage   azelastine (ASTELIN) 0.1 % nasal spray Place 1 spray into both nostrils 2 (two) times daily.   budesonide-formoterol (SYMBICORT) 160-4.5 MCG/ACT inhaler Inhale 2 puffs into the lungs in the morning and at bedtime.   Calcium Carbonate-Vitamin D (CALCIUM-VITAMIN D) 600-125 MG-UNIT TABS Take 1 tablet by mouth 2 (two) times daily.    diltiazem (CARDIZEM CD) 180 MG 24 hr capsule TAKE 1 CAPSULE EVERY       MORNING AND AT BEDTIME   ELIQUIS 5 MG TABS tablet TAKE 1 TABLET TWICE A DAY   EPINEPHrine 0.3 mg/0.3 mL IJ SOAJ injection SMARTSIG:0.3 Milligram(s) IM Once   esomeprazole (NEXIUM) 40 MG capsule Take 1 capsule (40 mg total) by mouth 2 (two) times daily.   Ferrous Sulfate (SLOW FE PO) Take 1 tablet by mouth every morning.   fluorouracil (EFUDEX) 5 % cream SMARTSIG:sparingly Topical Twice Daily   levocetirizine (XYZAL) 5 MG tablet Take 1 tablet (5 mg total) by mouth 2 (two) times daily as needed for allergies (Can take an extra dose during flare ups.).   levothyroxine (SYNTHROID, LEVOTHROID) 75 MCG tablet Take 75 mcg by mouth daily before breakfast.   montelukast (SINGULAIR)  10 MG tablet Take 1 tablet (10 mg total) by mouth at bedtime.   Multiple Vitamin (MULTIVITAMIN WITH MINERALS) TABS tablet Take 1 tablet daily by mouth.   Multiple Vitamins-Minerals (OCUVITE PRESERVISION PO) Take 1 tablet 2 (two) times daily by mouth.   rosuvastatin (CRESTOR) 5 MG tablet TAKE 1 TABLET DAILY   Current Facility-Administered Medications (Other)  Medication Route   Benralizumab SOSY 30 mg Subcutaneous      REVIEW OF SYSTEMS: ROS   Negative for: Constitutional, Gastrointestinal, Neurological, Skin, Genitourinary, Musculoskeletal, HENT, Endocrine, Cardiovascular, Eyes, Respiratory, Psychiatric, Allergic/Imm, Heme/Lymph Last edited by Hurman Horn, MD on 08/12/2021 10:28 AM.        ALLERGIES Allergies  Allergen Reactions   Other Other (See Comments)    Latex Gloves   Penicillins Hives    Has patient had a PCN reaction causing immediate rash, facial/tongue/throat swelling, SOB or lightheadedness with hypotension: No Has patient had a PCN reaction causing severe rash involving mucus membranes or skin necrosis: No Has patient had a PCN reaction that required hospitalization: No Has patient had a PCN reaction occurring within the last 10 years: Yes If all of the above answers are "NO", then may proceed with Cephalosporin use.    Sulfa Antibiotics Other (See Comments)    Unknown to patient    Sulfamethoxazole-Trimethoprim Other (See Comments)   Latex Hives and Rash    PAST MEDICAL HISTORY Past Medical History:  Diagnosis Date   Anxiety    Asthma    Asthmatic bronchitis    Atrial flutter ([place])    chads2vasc score is 3   Ehrlichiosis 7017   GERD (gastroesophageal reflux disease)    Granulomatosis with polyangiitis (HCC)    HTN (hypertension)    Hyperlipidemia    Nuclear sclerotic cataract of left eye 06/04/2019   Cataract surgery completed March 2023, Dr. [name]   Paroxysmal atrial fibrillation [facility])    Prostate cancer ([place])    Seasonal allergies    Past Surgical History:  Procedure Laterality Date   BASAL CELL CARCINOMA EXCISION  2013   EYELID   HERNIA REPAIR Bilateral 2000, 2002   KNEE ARTHROSCOPY  2000   prostectomy  1995   RADICAL   TONSILLECTOMY  1940    FAMILY HISTORY Family History  Problem Relation Age of Onset   Asthma Mother 77   Allergies Mother    Ulcers Mother    CVA Father 25   Heart Problems Father    Other Father        NEUROLOGIC ISSUES    Other Brother 20       BICYCLE ACCIDENT   Migraines Son    Diabetes Mellitus I Son     SOCIAL HISTORY Social History   Tobacco Use   Smoking status: Former    Packs/day: 0.50    Years: 4.00    Total pack years: 2.00    Types: Cigarettes    Quit date: 02/28/1961    Years  since quitting: 60.4   Smokeless tobacco: Never  Vaping Use   Vaping Use: Never used  Substance Use Topics   Alcohol use: Yes    Alcohol/week: 2.0 - 3.0 standard drinks of alcohol    Types: 2 - 3 Standard drinks or equivalent per week   Drug use: No         OPHTHALMIC EXAM:  Base Eye Exam     Visual Acuity (ETDRS)       Right Left   Dist [place] 20/80 -2 20/25  Dist ph [place] NI          Tonometry (Tonopen, 10:22 AM)       Right Left   Pressure Unable 10         Pupils       Dark Light React APD   Right [employer] Dilated   None   Left 3 3 Minimal None         Extraocular Movement       Right Left    Full Full         Neuro/Psych     Oriented x3: Yes   Mood/Affect: Normal         Dilation     Right eye: 1.0% Mydriacyl, 2.5% Phenylephrine @ 10:23 AM           Slit Lamp and Fundus Exam     External Exam       Right Left   External Normal Normal         Slit Lamp Exam       Right Left   Lids/Lashes Normal Normal   Conjunctiva/Sclera White and quiet White and quiet   Cornea Clear Clear   Anterior Chamber Deep and quiet has not, no remaining vitreous strand to the cornea Deep and quiet   Iris Round and reactive Round and reactive   Lens Posterior chamber intraocular lens, open PC slightly decentered superotemporal, no vitreous prolapse around the IOL 3+ Nuclear sclerosis   Anterior Vitreous Normal Normal         Fundus Exam       Right Left   Posterior Vitreous Clear, avitric    Disc Normal    C/D Ratio 0.65    Macula Post ILM changes, whitening of the retina in the macular region, fading with punctate intraretinal hemorrhage from prior ILM attachment sites over friable vessels    Vessels Normal    Periphery Normal             IMAGING AND PROCEDURES  Imaging and Procedures for 08/12/21           ASSESSMENT/PLAN:  Cystoid macular edema of right eye Postop day #1 vitrectomy ILM peel over the fovea looks great  with/topographic distortion.  Post ILM peel type change of the macula are appropriate.  Right epiretinal membrane Looks great OD, patient to resume topical medications and to restrict activity to that normal activity required to live daily.  No intentional activity to elevate the heart rate.  Do not mash or compress or rub the eye     ICD-10-CM   1. Cystoid macular edema of right eye  H35.351     2. Right epiretinal membrane  H35.371       1.  OD, postop day #1 vitrectomy membrane peel for severe epiretinal membrane secondary CME and persistence of large subfoveal drusenoid debris.  Looks great today.  Clear vitreous.  2.  Follow-up in 1 week.  OCT ext week  3.  Patient to resume topical medications and follow instructions as outlined  Ophthalmic Meds Ordered this visit:  No orders of the defined types were placed in this encounter.      Return in about 1 week (around 08/19/2021) for POST OP, dilate, OD, OCT.  Patient Instructions  Ofloxacin  4 times daily to the operative eye  Prednisolone acetate 1 drop to the operative eye 4 times daily  Patient instructed not to refill the medications and use them for maximum of 3 weeks.  Patient  instructed do not rub the eye.  Patient has the option to use the patch at night.   No lifting and bending for 1 week. No water IN the eye for 10 days. Do not rub the eye. Wear shield at night for 1-3 days.  Continue your topical medications for a total of 3 weeks.  Do not refill your postoperative medications unless instructed.  Refrain from exercise or intentional activity which increases our heart rate above resting levels.  Normal walking to complete normal activities of your day are appropriate.  Driving:  Legally, you only need one good eye, of 20/40 or better to drive.  However, the practice does not recommend driving during first weeks after surgery, IF you are uncomfortable with your visual functioning or capabilities.   If you have  known sleep apnea, wear your CPAP as you normally should.    Explained the diagnoses, plan, and follow up with the patient and they expressed understanding.  Patient expressed understanding of the importance of proper follow up care.   Clent Demark Isamu Trammel M.D. Diseases & Surgery of the Retina and Vitreous Retina & Diabetic [place] 08/12/21     Abbreviations: M myopia (nearsighted); A astigmatism; H hyperopia (farsighted); P presbyopia; Mrx spectacle prescription;  CTL contact lenses; OD right eye; OS left eye; OU both eyes  XT exotropia; ET esotropia; PEK punctate epithelial keratitis; PEE punctate epithelial erosions; DES dry eye syndrome; MGD meibomian gland dysfunction; ATs artificial tears; PFAT's preservative free artificial tears; [place] nuclear sclerotic cataract; PSC posterior subcapsular cataract; ERM epi-retinal membrane; PVD posterior vitreous detachment; RD retinal detachment; DM diabetes mellitus; DR diabetic retinopathy; NPDR non-proliferative diabetic retinopathy; PDR proliferative diabetic retinopathy; CSME clinically significant macular edema; DME diabetic macular edema; dbh dot blot hemorrhages; CWS cotton wool spot; POAG primary open angle glaucoma; C/D cup-to-disc ratio; HVF humphrey visual field; GVF goldmann visual field; OCT optical coherence tomography; IOP intraocular pressure; BRVO Branch retinal vein occlusion; CRVO central retinal vein occlusion; CRAO central retinal artery occlusion; BRAO branch retinal artery occlusion; RT retinal tear; SB scleral buckle; PPV pars plana vitrectomy; VH Vitreous hemorrhage; PRP panretinal laser photocoagulation; IVK intravitreal kenalog; VMT vitreomacular traction; MH Macular hole;  NVD neovascularization of the disc; NVE neovascularization elsewhere; AREDS age related eye disease study; ARMD age related macular degeneration; POAG primary open angle glaucoma; EBMD epithelial/anterior basement membrane dystrophy; ACIOL anterior chamber intraocular  lens; IOL intraocular lens; PCIOL posterior chamber intraocular lens; Phaco/IOL phacoemulsification with intraocular lens placement; [place] photorefractive keratectomy; LASIK laser assisted in situ keratomileusis; HTN hypertension; DM diabetes mellitus; COPD chronic obstructive pulmonary disease

## 2021-08-12 NOTE — Assessment & Plan Note (Signed)
Postop day #1 vitrectomy ILM peel over the fovea looks great with/topographic distortion.  Post ILM peel type change of the macula are appropriate.

## 2021-08-12 NOTE — Patient Instructions (Signed)

## 2021-08-12 NOTE — Assessment & Plan Note (Signed)
Looks great OD, patient to resume topical medications and to restrict activity to that normal activity required to live daily.  No intentional activity to elevate the heart rate.  Do not mash or compress or rub the eye

## 2021-08-15 ENCOUNTER — Other Ambulatory Visit (HOSPITAL_COMMUNITY): Payer: Self-pay | Admitting: Physician Assistant

## 2021-08-17 ENCOUNTER — Ambulatory Visit: Payer: Medicare Other

## 2021-08-18 ENCOUNTER — Encounter (INDEPENDENT_AMBULATORY_CARE_PROVIDER_SITE_OTHER): Payer: Self-pay | Admitting: Ophthalmology

## 2021-08-18 ENCOUNTER — Ambulatory Visit (INDEPENDENT_AMBULATORY_CARE_PROVIDER_SITE_OTHER): Payer: Medicare Other | Admitting: Ophthalmology

## 2021-08-18 DIAGNOSIS — H35351 Cystoid macular degeneration, right eye: Secondary | ICD-10-CM | POA: Diagnosis not present

## 2021-08-18 DIAGNOSIS — R82998 Other abnormal findings in urine: Secondary | ICD-10-CM | POA: Diagnosis not present

## 2021-08-18 DIAGNOSIS — H4301 Vitreous prolapse, right eye: Secondary | ICD-10-CM | POA: Diagnosis not present

## 2021-08-18 DIAGNOSIS — H353212 Exudative age-related macular degeneration, right eye, with inactive choroidal neovascularization: Secondary | ICD-10-CM | POA: Diagnosis not present

## 2021-08-18 DIAGNOSIS — H35371 Puckering of macula, right eye: Secondary | ICD-10-CM | POA: Diagnosis not present

## 2021-08-18 MED ORDER — KETOROLAC TROMETHAMINE 0.5 % OP SOLN: 1.0000 [drp] | Freq: Two times a day (BID) | OPHTHALMIC | 0 refills | Status: DC

## 2021-08-18 NOTE — Assessment & Plan Note (Signed)
Resolved, post vit ac washout

## 2021-08-18 NOTE — Progress Notes (Signed)
08/18/2021     CHIEF COMPLAINT Patient presents for  Chief Complaint  Patient presents with   Post-op Follow-up      HISTORY OF PRESENT ILLNESS: Chad Morrison is a 88 y.o. male who presents to the clinic today for:   HPI     Post-op Follow-up           Laterality: right eye   Discomfort: Negative for pain, itching, foreign body sensation, tearing, discharge and floaters   Vision: is improved         Comments   1 week for POST OP DILATE OD, OCT. SX. 08/11/2021 Pt stated vision has improved since procedure. Pt reports, "there was a gray blur line at the top left but its not as defined as before." Pt denies FOL.        Last edited by Silvestre Moment on 08/18/2021 10:16 AM.      Referring physician: Haywood Pao, MD [place],  [place] [address]  HISTORICAL INFORMATION:   Selected notes from the MEDICAL RECORD NUMBER       CURRENT MEDICATIONS: Current Outpatient Medications (Ophthalmic Drugs)  Medication Sig   ofloxacin (OCUFLOX) 0.3 % ophthalmic solution Place 1 drop into the right eye in the morning, at noon, in the evening, and at bedtime for 25 days.   Polyethyl Glycol-Propyl Glycol (SYSTANE OP) Apply 1 drop to eye every morning.   prednisoLONE acetate (PRED FORTE) 1 % ophthalmic suspension Place 1 drop into the right eye 4 (four) times daily.   PROLENSA 0.07 % SOLN INSTILL 1 DROP INTO THE    RIGHT EYE DAILY   Propylene Glycol 0.6 % SOLN Apply 1 drop to eye in the morning.   No current facility-administered medications for this visit. (Ophthalmic Drugs)   Current Outpatient Medications (Other)  Medication Sig   albuterol (VENTOLIN HFA) 108 (90 Base) MCG/ACT inhaler Inhale 2 puffs into the lungs every 4 (four) hours as needed for wheezing or shortness of breath.   ALPRAZolam (XANAX) 0.25 MG tablet Take 0.25 mg by mouth at bedtime as needed for sleep.    Ascorbic Acid (VITAMIN C PO) Take 1 tablet by mouth every morning. Not sure the dosage    azelastine (ASTELIN) 0.1 % nasal spray Place 1 spray into both nostrils 2 (two) times daily.   budesonide-formoterol (SYMBICORT) 160-4.5 MCG/ACT inhaler Inhale 2 puffs into the lungs in the morning and at bedtime.   Calcium Carbonate-Vitamin D (CALCIUM-VITAMIN D) 600-125 MG-UNIT TABS Take 1 tablet by mouth 2 (two) times daily.    diltiazem (CARDIZEM CD) 180 MG 24 hr capsule TAKE 1 CAPSULE EVERY       MORNING AND AT BEDTIME   ELIQUIS 5 MG TABS tablet TAKE 1 TABLET TWICE A DAY   EPINEPHrine 0.3 mg/0.3 mL IJ SOAJ injection SMARTSIG:0.3 Milligram(s) IM Once   esomeprazole (NEXIUM) 40 MG capsule Take 1 capsule (40 mg total) by mouth 2 (two) times daily.   Ferrous Sulfate (SLOW FE PO) Take 1 tablet by mouth every morning.   fluorouracil (EFUDEX) 5 % cream SMARTSIG:sparingly Topical Twice Daily   levocetirizine (XYZAL) 5 MG tablet Take 1 tablet (5 mg total) by mouth 2 (two) times daily as needed for allergies (Can take an extra dose during flare ups.).   levothyroxine (SYNTHROID, LEVOTHROID) 75 MCG tablet Take 75 mcg by mouth daily before breakfast.   montelukast (SINGULAIR) 10 MG tablet Take 1 tablet (10 mg total) by mouth at bedtime.   Multiple Vitamin (  MULTIVITAMIN WITH MINERALS) TABS tablet Take 1 tablet daily by mouth.   Multiple Vitamins-Minerals (OCUVITE PRESERVISION PO) Take 1 tablet 2 (two) times daily by mouth.   rosuvastatin (CRESTOR) 5 MG tablet Take 1 tablet (5 mg total) by mouth daily.   Current Facility-Administered Medications (Other)  Medication Route   Benralizumab SOSY 30 mg Subcutaneous      REVIEW OF SYSTEMS: ROS   Negative for: Constitutional, Gastrointestinal, Neurological, Skin, Genitourinary, Musculoskeletal, HENT, Endocrine, Cardiovascular, Eyes, Respiratory, Psychiatric, Allergic/Imm, Heme/Lymph Last edited by Silvestre Moment on 08/18/2021 10:16 AM.       ALLERGIES Allergies  Allergen Reactions   Other Other (See Comments)    Latex Gloves   Penicillins Hives    Has  patient had a PCN reaction causing immediate rash, facial/tongue/throat swelling, SOB or lightheadedness with hypotension: No Has patient had a PCN reaction causing severe rash involving mucus membranes or skin necrosis: No Has patient had a PCN reaction that required hospitalization: No Has patient had a PCN reaction occurring within the last 10 years: Yes If all of the above answers are "NO", then may proceed with Cephalosporin use.    Sulfa Antibiotics Other (See Comments)    Unknown to patient    Sulfamethoxazole-Trimethoprim Other (See Comments)   Latex Hives and Rash    PAST MEDICAL HISTORY Past Medical History:  Diagnosis Date   Anxiety    Asthma    Asthmatic bronchitis    Atrial flutter ([place])    chads2vasc score is 3   Ehrlichiosis 3419   GERD (gastroesophageal reflux disease)    Granulomatosis with polyangiitis (HCC)    HTN (hypertension)    Hyperlipidemia    Nuclear sclerotic cataract of left eye 06/04/2019   Cataract surgery completed March 2023, Dr. [name]   Paroxysmal atrial fibrillation [facility])    Prostate cancer ([place])    Seasonal allergies    Past Surgical History:  Procedure Laterality Date   BASAL CELL CARCINOMA EXCISION  2013   EYELID   HERNIA REPAIR Bilateral 2000, 2002   KNEE ARTHROSCOPY  2000   prostectomy  1995   RADICAL   TONSILLECTOMY  1940    FAMILY HISTORY Family History  Problem Relation Age of Onset   Asthma Mother 77   Allergies Mother    Ulcers Mother    CVA Father 34   Heart Problems Father    Other Father        NEUROLOGIC ISSUES    Other Brother 80       BICYCLE ACCIDENT   Migraines Son    Diabetes Mellitus I Son     SOCIAL HISTORY Social History   Tobacco Use   Smoking status: Former    Packs/day: 0.50    Years: 4.00    Total pack years: 2.00    Types: Cigarettes    Quit date: 02/28/1961    Years since quitting: 60.5   Smokeless tobacco: Never  Vaping Use   Vaping Use: Never used  Substance Use Topics   Alcohol use:  Yes    Alcohol/week: 2.0 - 3.0 standard drinks of alcohol    Types: 2 - 3 Standard drinks or equivalent per week   Drug use: No         OPHTHALMIC EXAM:  Base Eye Exam     Visual Acuity (ETDRS)       Right Left   Dist [place] 20/50 20/25   Dist ph Acc[place] 20/40 -2  Tonometry (Tonopen, 10:22 AM)       Right Left   Pressure 15 15         Pupils       Pupils APD   Right PERRL None   Left PERRL None         Visual Fields       Left Right    Full Full         Extraocular Movement       Right Left    Full Full         Neuro/Psych     Oriented x3: Yes   Mood/Affect: Normal         Dilation     Right eye: 1.0% Mydriacyl, 2.5% Phenylephrine @ 10:22 AM           Slit Lamp and Fundus Exam     External Exam       Right Left   External Normal Normal         Slit Lamp Exam       Right Left   Lids/Lashes Normal Normal   Conjunctiva/Sclera White and quiet White and quiet   Cornea Clear Clear   Anterior Chamber Deep and quiet has not, no remaining vitreous strand to the cornea Deep and quiet   Iris Round and reactive Round and reactive   Lens Posterior chamber intraocular lens, open PC slightly decentered superotemporal, no vitreous prolapse around the IOL 3+ Nuclear sclerosis   Anterior Vitreous Normal Normal         Fundus Exam       Right Left   Posterior Vitreous Clear, avitric    Disc Normal    C/D Ratio 0.65    Macula Post ILM changes, whitening of the retina in the macular region, fading with punctate intraretinal hemorrhage from prior ILM attachment sites over friable vessels    Vessels Normal    Periphery Normal             IMAGING AND PROCEDURES  Imaging and Procedures for 08/18/21  OCT, Retina - OU - Both Eyes       Right Eye Quality was good. Scan locations included subfoveal. Central Foveal Thickness: 483. Progression has improved. Findings include retinal drusen , cystoid macular edema, epiretinal  membrane.   Left Eye Quality was good. Scan locations included subfoveal. Central Foveal Thickness: 359. Progression has been stable. Findings include abnormal foveal contour, retinal drusen .   Notes OD, much improved CME now, yet still active, with subfoveal large drusenoid deposit likely secondary from the overlying epiretinal membrane, now improved 1 week post ILM peel with slightly large drusenoid subfoveal appearance.                   ASSESSMENT/PLAN:  Vitreous prolapse of right eye Resolved, post vit ac washout  Right epiretinal membrane 1 week post vitrectomy ILM peel,/topographic distortion central drusen remains.  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) No sign of CNVM  Cystoid macular edema of right eye Residual CME present, will restart ketorolac, 1 drop right eye twice daily     ICD-10-CM   1. Cystoid macular edema of right eye  H35.351 OCT, Retina - OU - Both Eyes    2. Vitreous prolapse of right eye  H43.01     3. Right epiretinal membrane  H35.371     4. Exudative age-related macular degeneration of right eye with inactive choroidal neovascularization ([place])  E08.1448  1.  OD looks great, vitreous prolapse resolved via vitrectomy  2.  OD looks great, post vitrectomy membrane peel  3.  Subfoveal drusenoid deposit of ARMD and likely worsened by epiretinal membrane is also smaller 1 week post vitrectomy membrane peel.  4.  CME OD slightly worse post ILM peel, will add topical ketorolac twice daily, as we taper prednisone acetate to twice daily  Ophthalmic Meds Ordered this visit:  No orders of the defined types were placed in this encounter.      Return in about 4 weeks (around 09/15/2021) for dilate, OD, POST OP, OCT.  Patient Instructions  Prednisolone acetate 1 drop right eye twice daily-Pink top  Ofloxacin (tan top) 1 drop right eye twice daily  Ketorolac (gray top) 1  drop right eye twice daily     Explained the diagnoses, plan, and follow up with the patient and they expressed understanding.  Patient expressed understanding of the importance of proper follow up care.   Clent Demark Kenneth Lax M.D. Diseases & Surgery of the Retina and Vitreous Retina [facility] Diabetic [place] 08/18/21     Abbreviations: M myopia (nearsighted); A astigmatism; H hyperopia (farsighted); P presbyopia; Mrx spectacle prescription;  CTL contact lenses; OD right eye; OS left eye; OU both eyes  XT exotropia; ET esotropia; PEK punctate epithelial keratitis; PEE punctate epithelial erosions; DES dry eye syndrome; MGD meibomian gland dysfunction; ATs artificial tears; PFAT's preservative free artificial tears; [place] nuclear sclerotic cataract; PSC posterior subcapsular cataract; ERM epi-retinal membrane; PVD posterior vitreous detachment; RD retinal detachment; DM diabetes mellitus; DR diabetic retinopathy; NPDR non-proliferative diabetic retinopathy; PDR proliferative diabetic retinopathy; CSME clinically significant macular edema; DME diabetic macular edema; dbh dot blot hemorrhages; CWS cotton wool spot; POAG primary open angle glaucoma; C/D cup-to-disc ratio; HVF humphrey visual field; GVF goldmann visual field; OCT optical coherence tomography; IOP intraocular pressure; BRVO Branch retinal vein occlusion; CRVO central retinal vein occlusion; CRAO central retinal artery occlusion; BRAO branch retinal artery occlusion; RT retinal tear; SB scleral buckle; PPV pars plana vitrectomy; VH Vitreous hemorrhage; PRP panretinal laser photocoagulation; IVK intravitreal kenalog; VMT vitreomacular traction; MH Macular hole;  NVD neovascularization of the disc; NVE neovascularization elsewhere; AREDS age related eye disease study; ARMD age related macular degeneration; POAG primary open angle glaucoma; EBMD epithelial/anterior basement membrane dystrophy; ACIOL anterior chamber intraocular lens; IOL intraocular lens;  PCIOL posterior chamber intraocular lens; Phaco/IOL phacoemulsification with intraocular lens placement; [place] photorefractive keratectomy; LASIK laser assisted in situ keratomileusis; HTN hypertension; DM diabetes mellitus; COPD chronic obstructive pulmonary disease

## 2021-08-18 NOTE — Patient Instructions (Signed)
Prednisolone acetate 1 drop right eye twice daily-Pink top  Ofloxacin (tan top) 1 drop right eye twice daily  Ketorolac (gray top) 1 drop right eye twice daily

## 2021-08-18 NOTE — Assessment & Plan Note (Signed)
No sign of CNVM 

## 2021-08-18 NOTE — Assessment & Plan Note (Signed)
1 week post vitrectomy ILM peel,/topographic distortion central drusen remains.

## 2021-08-18 NOTE — Assessment & Plan Note (Signed)
Residual CME present, will restart ketorolac, 1 drop right eye twice daily

## 2021-08-23 DIAGNOSIS — J4550 Severe persistent asthma, uncomplicated: Secondary | ICD-10-CM | POA: Diagnosis not present

## 2021-08-24 ENCOUNTER — Ambulatory Visit (INDEPENDENT_AMBULATORY_CARE_PROVIDER_SITE_OTHER): Payer: Medicare Other

## 2021-08-24 DIAGNOSIS — J4550 Severe persistent asthma, uncomplicated: Secondary | ICD-10-CM | POA: Diagnosis not present

## 2021-08-26 DIAGNOSIS — Z85828 Personal history of other malignant neoplasm of skin: Secondary | ICD-10-CM | POA: Diagnosis not present

## 2021-08-26 DIAGNOSIS — L814 Other melanin hyperpigmentation: Secondary | ICD-10-CM | POA: Diagnosis not present

## 2021-08-26 DIAGNOSIS — L821 Other seborrheic keratosis: Secondary | ICD-10-CM | POA: Diagnosis not present

## 2021-08-26 DIAGNOSIS — L570 Actinic keratosis: Secondary | ICD-10-CM | POA: Diagnosis not present

## 2021-08-26 DIAGNOSIS — C44529 Squamous cell carcinoma of skin of other part of trunk: Secondary | ICD-10-CM | POA: Diagnosis not present

## 2021-08-26 DIAGNOSIS — Z85820 Personal history of malignant melanoma of skin: Secondary | ICD-10-CM | POA: Diagnosis not present

## 2021-08-26 DIAGNOSIS — Z08 Encounter for follow-up examination after completed treatment for malignant neoplasm: Secondary | ICD-10-CM | POA: Diagnosis not present

## 2021-08-26 DIAGNOSIS — D485 Neoplasm of uncertain behavior of skin: Secondary | ICD-10-CM | POA: Diagnosis not present

## 2021-08-26 DIAGNOSIS — D1801 Hemangioma of skin and subcutaneous tissue: Secondary | ICD-10-CM | POA: Diagnosis not present

## 2021-09-01 DIAGNOSIS — T462X5A Adverse effect of other antidysrhythmic drugs, initial encounter: Secondary | ICD-10-CM | POA: Diagnosis not present

## 2021-09-01 DIAGNOSIS — Z79890 Hormone replacement therapy (postmenopausal): Secondary | ICD-10-CM | POA: Diagnosis not present

## 2021-09-01 DIAGNOSIS — E032 Hypothyroidism due to medicaments and other exogenous substances: Secondary | ICD-10-CM | POA: Diagnosis not present

## 2021-09-15 ENCOUNTER — Encounter (INDEPENDENT_AMBULATORY_CARE_PROVIDER_SITE_OTHER): Payer: Self-pay | Admitting: Ophthalmology

## 2021-09-15 ENCOUNTER — Ambulatory Visit (INDEPENDENT_AMBULATORY_CARE_PROVIDER_SITE_OTHER): Payer: Medicare Other | Admitting: Ophthalmology

## 2021-09-15 DIAGNOSIS — H353132 Nonexudative age-related macular degeneration, bilateral, intermediate dry stage: Secondary | ICD-10-CM | POA: Diagnosis not present

## 2021-09-15 DIAGNOSIS — H35371 Puckering of macula, right eye: Secondary | ICD-10-CM | POA: Diagnosis not present

## 2021-09-15 DIAGNOSIS — H35351 Cystoid macular degeneration, right eye: Secondary | ICD-10-CM | POA: Diagnosis not present

## 2021-09-15 MED ORDER — KETOROLAC TROMETHAMINE 0.5 % OP SOLN: 1.0000 [drp] | Freq: Four times a day (QID) | OPHTHALMIC | 6 refills | Status: DC

## 2021-09-15 NOTE — Assessment & Plan Note (Signed)
On ketorolac twice daily OD at this time.  Will resume 4 times daily drop to the right eye for increasing CME.

## 2021-09-15 NOTE — Assessment & Plan Note (Signed)
Large subfoveal nodular drusenoid material seen prior to vitrectomy and membrane peel is continuing to slowly resolve as a vertical height is diminished from 220 m now down to 180 m

## 2021-09-15 NOTE — Progress Notes (Signed)
09/15/2021     CHIEF COMPLAINT Patient presents for  Chief Complaint  Patient presents with   Post-op Follow-up      HISTORY OF PRESENT ILLNESS: Chad Morrison is a 88 y.o. male who presents to the clinic today for:   HPI   4 weeks dilate OD Post op OCT,  1 month post vit ilm peel for ERM, CME and subfoveal nodular debris. Pt states his vision has been stable  Pt denies any new floaters or FOL  Last edited by Hurman Horn, MD on 09/15/2021 11:15 AM.      Referring physician: Haywood Pao, MD [place],  [place] [id number]  HISTORICAL INFORMATION:   Selected notes from the MEDICAL RECORD NUMBER       CURRENT MEDICATIONS: Current Outpatient Medications (Ophthalmic Drugs)  Medication Sig   ketorolac (ACULAR) 0.5 % ophthalmic solution Place 1 drop into the right eye 4 (four) times daily.   Polyethyl Glycol-Propyl Glycol (SYSTANE OP) Apply 1 drop to eye every morning.   prednisoLONE acetate (PRED FORTE) 1 % ophthalmic suspension Place 1 drop into the right eye 4 (four) times daily.   Propylene Glycol 0.6 % SOLN Apply 1 drop to eye in the morning.   No current facility-administered medications for this visit. (Ophthalmic Drugs)   Current Outpatient Medications (Other)  Medication Sig   albuterol (VENTOLIN HFA) 108 (90 Base) MCG/ACT inhaler Inhale 2 puffs into the lungs every 4 (four) hours as needed for wheezing or shortness of breath.   ALPRAZolam (XANAX) 0.25 MG tablet Take 0.25 mg by mouth at bedtime as needed for sleep.    Ascorbic Acid (VITAMIN C PO) Take 1 tablet by mouth every morning. Not sure the dosage   azelastine (ASTELIN) 0.1 % nasal spray Place 1 spray into both nostrils 2 (two) times daily.   budesonide-formoterol (SYMBICORT) 160-4.5 MCG/ACT inhaler Inhale 2 puffs into the lungs in the morning and at bedtime.   Calcium Carbonate-Vitamin D (CALCIUM-VITAMIN D) 600-125 MG-UNIT TABS Take 1 tablet by mouth 2 (two) times daily.    diltiazem  (CARDIZEM CD) 180 MG 24 hr capsule TAKE 1 CAPSULE EVERY       MORNING AND AT BEDTIME   ELIQUIS 5 MG TABS tablet TAKE 1 TABLET TWICE A DAY   EPINEPHrine 0.3 mg/0.3 mL IJ SOAJ injection SMARTSIG:0.3 Milligram(s) IM Once   esomeprazole (NEXIUM) 40 MG capsule Take 1 capsule (40 mg total) by mouth 2 (two) times daily.   Ferrous Sulfate (SLOW FE PO) Take 1 tablet by mouth every morning.   fluorouracil (EFUDEX) 5 % cream SMARTSIG:sparingly Topical Twice Daily   levocetirizine (XYZAL) 5 MG tablet Take 1 tablet (5 mg total) by mouth 2 (two) times daily as needed for allergies (Can take an extra dose during flare ups.).   levothyroxine (SYNTHROID, LEVOTHROID) 75 MCG tablet Take 75 mcg by mouth daily before breakfast.   montelukast (SINGULAIR) 10 MG tablet Take 1 tablet (10 mg total) by mouth at bedtime.   Multiple Vitamin (MULTIVITAMIN WITH MINERALS) TABS tablet Take 1 tablet daily by mouth.   Multiple Vitamins-Minerals (OCUVITE PRESERVISION PO) Take 1 tablet 2 (two) times daily by mouth.   rosuvastatin (CRESTOR) 5 MG tablet Take 1 tablet (5 mg total) by mouth daily.   Current Facility-Administered Medications (Other)  Medication Route   Benralizumab SOSY 30 mg Subcutaneous      REVIEW OF SYSTEMS: ROS   Negative for: Constitutional, Gastrointestinal, Neurological, Skin, Genitourinary, Musculoskeletal, HENT, Endocrine, Cardiovascular,  Eyes, Respiratory, Psychiatric, Allergic/Imm, Heme/Lymph Last edited by Morene Rankins, CMA on 09/15/2021 10:49 AM.       ALLERGIES Allergies  Allergen Reactions   Other Other (See Comments)    Latex Gloves   Penicillins Hives    Has patient had a PCN reaction causing immediate rash, facial/tongue/throat swelling, SOB or lightheadedness with hypotension: No Has patient had a PCN reaction causing severe rash involving mucus membranes or skin necrosis: No Has patient had a PCN reaction that required hospitalization: No Has patient had a PCN reaction occurring  within the last 10 years: Yes If all of the above answers are "NO", then may proceed with Cephalosporin use.    Sulfa Antibiotics Other (See Comments)    Unknown to patient    Sulfamethoxazole-Trimethoprim Other (See Comments)   Latex Hives and Rash    PAST MEDICAL HISTORY Past Medical History:  Diagnosis Date   Anxiety    Asthma    Asthmatic bronchitis    Atrial flutter ([place])    chads2vasc score is 3   Ehrlichiosis 9147   GERD (gastroesophageal reflux disease)    Granulomatosis with polyangiitis (HCC)    HTN (hypertension)    Hyperlipidemia    Nuclear sclerotic cataract of left eye 06/04/2019   Cataract surgery completed March 2023, Dr. [name]   Paroxysmal atrial fibrillation [facility])    Prostate cancer ([place])    Seasonal allergies    Past Surgical History:  Procedure Laterality Date   BASAL CELL CARCINOMA EXCISION  2013   EYELID   HERNIA REPAIR Bilateral 2000, 2002   KNEE ARTHROSCOPY  2000   prostectomy  1995   RADICAL   TONSILLECTOMY  1940    FAMILY HISTORY Family History  Problem Relation Age of Onset   Asthma Mother 82   Allergies Mother    Ulcers Mother    CVA Father 52   Heart Problems Father    Other Father        NEUROLOGIC ISSUES    Other Brother 99       BICYCLE ACCIDENT   Migraines Son    Diabetes Mellitus I Son     SOCIAL HISTORY Social History   Tobacco Use   Smoking status: Former    Packs/day: 0.50    Years: 4.00    Total pack years: 2.00    Types: Cigarettes    Quit date: 02/28/1961    Years since quitting: 60.5   Smokeless tobacco: Never  Vaping Use   Vaping Use: Never used  Substance Use Topics   Alcohol use: Yes    Alcohol/week: 2.0 - 3.0 standard drinks of alcohol    Types: 2 - 3 Standard drinks or equivalent per week   Drug use: No         OPHTHALMIC EXAM:  Base Eye Exam     Visual Acuity (ETDRS)       Right Left   Dist [place] 20/60 20/25         Tonometry (Tonopen, 10:56 AM)       Right Left   Pressure 9 10          Neuro/Psych     Oriented x3: Yes   Mood/Affect: Normal         Dilation     Right eye: 2.5% Phenylephrine, 1.0% Mydriacyl @ 10:51 AM           Slit Lamp and Fundus Exam     External Exam  Right Left   External Normal Normal         Slit Lamp Exam       Right Left   Lids/Lashes Normal Normal   Conjunctiva/Sclera White and quiet White and quiet   Cornea Clear Clear   Anterior Chamber Deep and quiet has not, no remaining vitreous strand to the cornea Deep and quiet   Iris Round and reactive Round and reactive   Lens Posterior chamber intraocular lens, open PC slightly decentered superotemporal, no vitreous prolapse around the IOL 3+ Nuclear sclerosis   Anterior Vitreous Normal Normal         Fundus Exam       Right Left   Posterior Vitreous Clear, avitric    Disc Normal    C/D Ratio 0.65    Macula Much less topographic distortion subfoveal nodular material remains    Vessels Normal    Periphery Normal             IMAGING AND PROCEDURES  Imaging and Procedures for 09/15/21  OCT, Retina - OU - Both Eyes       Right Eye Quality was good. Scan locations included subfoveal. Central Foveal Thickness: 520. Progression has worsened. Findings include retinal drusen , cystoid macular edema, epiretinal membrane.   Left Eye Quality was good. Scan locations included subfoveal. Central Foveal Thickness: 359. Progression has been stable. Findings include abnormal foveal contour, retinal drusen .   Notes OD, slightly worse CME now, yet still active, with subfoveal large drusenoid deposit likely secondary from the overlying epiretinal membrane, now with improvement of drusenoid material from 220 m now down to 170 m in vertical height               ASSESSMENT/PLAN:  Cystoid macular edema of right eye On ketorolac twice daily OD at this time.  Will resume 4 times daily drop to the right eye for increasing CME.  Intermediate stage  nonexudative age-related macular degeneration of both eyes Large subfoveal nodular drusenoid material seen prior to vitrectomy and membrane peel is continuing to slowly resolve as a vertical height is diminished from 220 m now down to 180 m  Right epiretinal membrane Much less topographic distortion,     ICD-10-CM   1. Cystoid macular edema of right eye  H35.351 OCT, Retina - OU - Both Eyes    2. Intermediate stage nonexudative age-related macular degeneration of both eyes  H35.3132     3. Right epiretinal membrane  H35.371       1.  OD, overall much improved as subfoveal nodular material in the photoreceptor layer is diminishing in vertical height however overlying CME has worsened as ketorolac has been tapered to twice daily.  We will reinstate the use of ketorolac 4 times daily OD.  2.  No improvement OD next and CME component, will add subtenon's Kenalog  3.  Ophthalmic Meds Ordered this visit:  Meds ordered this encounter  Medications   ketorolac (ACULAR) 0.5 % ophthalmic solution    Sig: Place 1 drop into the right eye 4 (four) times daily.    Dispense:  5 mL    Refill:  6       Return in about 3 weeks (around 10/06/2021) for dilate, OD, OCT possible subtenon's steroid.  There are no Patient Instructions on file for this visit.   Explained the diagnoses, plan, and follow up with the patient and they expressed understanding.  Patient expressed understanding of the importance of proper follow up  care.   Clent Demark. Urbano Milhouse M.D. Diseases & Surgery of the Retina and Vitreous [facility] [place] 09/15/21     Abbreviations: M myopia (nearsighted); A astigmatism; H hyperopia (farsighted); P presbyopia; Mrx spectacle prescription;  CTL contact lenses; OD right eye; OS left eye; OU both eyes  XT exotropia; ET esotropia; PEK punctate epithelial keratitis; PEE punctate epithelial erosions; DES dry eye syndrome; MGD meibomian gland dysfunction; ATs artificial tears;  PFAT's preservative free artificial tears; [place] nuclear sclerotic cataract; PSC posterior subcapsular cataract; ERM epi-retinal membrane; PVD posterior vitreous detachment; RD retinal detachment; DM diabetes mellitus; DR diabetic retinopathy; NPDR non-proliferative diabetic retinopathy; PDR proliferative diabetic retinopathy; CSME clinically significant macular edema; DME diabetic macular edema; dbh dot blot hemorrhages; CWS cotton wool spot; POAG primary open angle glaucoma; C/D cup-to-disc ratio; HVF humphrey visual field; GVF goldmann visual field; OCT optical coherence tomography; IOP intraocular pressure; BRVO Branch retinal vein occlusion; CRVO central retinal vein occlusion; CRAO central retinal artery occlusion; BRAO branch retinal artery occlusion; RT retinal tear; SB scleral buckle; PPV pars plana vitrectomy; VH Vitreous hemorrhage; PRP panretinal laser photocoagulation; IVK intravitreal kenalog; VMT vitreomacular traction; MH Macular hole;  NVD neovascularization of the disc; NVE neovascularization elsewhere; AREDS age related eye disease study; ARMD age related macular degeneration; POAG primary open angle glaucoma; EBMD epithelial/anterior basement membrane dystrophy; ACIOL anterior chamber intraocular lens; IOL intraocular lens; PCIOL posterior chamber intraocular lens; Phaco/IOL phacoemulsification with intraocular lens placement; [place] photorefractive keratectomy; LASIK laser assisted in situ keratomileusis; HTN hypertension; DM diabetes mellitus; COPD chronic obstructive pulmonary disease

## 2021-09-15 NOTE — Assessment & Plan Note (Signed)
Much less topographic distortion,

## 2021-09-30 ENCOUNTER — Ambulatory Visit (INDEPENDENT_AMBULATORY_CARE_PROVIDER_SITE_OTHER): Payer: Medicare Other | Admitting: Ophthalmology

## 2021-09-30 ENCOUNTER — Encounter (INDEPENDENT_AMBULATORY_CARE_PROVIDER_SITE_OTHER): Payer: Self-pay | Admitting: Ophthalmology

## 2021-09-30 DIAGNOSIS — H4051X3 Glaucoma secondary to other eye disorders, right eye, severe stage: Secondary | ICD-10-CM

## 2021-09-30 DIAGNOSIS — H35351 Cystoid macular degeneration, right eye: Secondary | ICD-10-CM | POA: Diagnosis not present

## 2021-09-30 DIAGNOSIS — H209 Unspecified iridocyclitis: Secondary | ICD-10-CM

## 2021-09-30 DIAGNOSIS — H18221 Idiopathic corneal edema, right eye: Secondary | ICD-10-CM

## 2021-09-30 DIAGNOSIS — H4040X3 Glaucoma secondary to eye inflammation, unspecified eye, severe stage: Secondary | ICD-10-CM

## 2021-09-30 MED ORDER — LOTEPREDNOL ETABONATE 0.5 % OP SUSP: 1.0000 [drp] | Freq: Every day | OPHTHALMIC | 1 refills | Status: DC

## 2021-09-30 MED ORDER — LATANOPROST 0.005 % OP SOLN: 1.0000 [drp] | Freq: Every day | OPHTHALMIC | 1 refills | Status: DC

## 2021-09-30 MED ORDER — BRIMONIDINE TARTRATE 0.15 % OP SOLN: 1.0000 [drp] | Freq: Three times a day (TID) | OPHTHALMIC | 1 refills | Status: DC

## 2021-09-30 MED ORDER — DORZOLAMIDE HCL 2 % OP SOLN: 1.0000 [drp] | Freq: Three times a day (TID) | OPHTHALMIC | 1 refills | Status: DC

## 2021-09-30 NOTE — Patient Instructions (Addendum)
1.  Discontinue ketorolac OD  2.  Commence latanoprost (Xalatan) 1 drop nightly right eye  3.  Commence Alphagan 1 drop right eye 3 times daily while awake  4.  Commence with dorzolamide 1 drop right eye 3 times daily (orange top) while awake, if itching or laying or throat swelling develops do not use this drop, has mild sulfa allergy overlay  5.,  Lotemax 1 drop right eye once daily.  Do not rub mash compress the right eye.  No heavy lifting bending or straining today.

## 2021-09-30 NOTE — Assessment & Plan Note (Signed)
Likely related to trabeculitis in the right eye not on steroids at this time not no recent topical or periocular steroids given

## 2021-09-30 NOTE — Assessment & Plan Note (Signed)
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  Patient does have asthma cannot use timolol, he may have a mild allergy systemically due to sulfa but will nonetheless add dorzolamide as an individual medication  We will commence therapy with dorzolamide 1 drop 3 times daily OD  Alphagan 1 drop 3 times daily OD,  and latanoprost nightly OD

## 2021-09-30 NOTE — Assessment & Plan Note (Signed)
Likely secondary to surprising elevation of intraocular pressure.  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  Likely a transient phenomenon.  We will treat maximum tolerated medical therapy without beta-blocker due to significant asthma

## 2021-09-30 NOTE — Progress Notes (Signed)
09/30/2021     CHIEF COMPLAINT Patient presents for  Chief Complaint  Patient presents with   Eye Problem      HISTORY OF PRESENT ILLNESS: Chad Morrison is a 88 y.o. male who presents to the clinic today for:   HPI   WIP- Cloudy vision OD. Pt stated, "I woke up this morning and I noticed cloudy vision. My nose started hurting where my right eye is and now my right eye is starting to hurt. Everything is really hazy out of my right eye." Pt describes the pain as an ache.  Last edited by Silvestre Moment on 09/30/2021  3:00 PM.      Referring physician: Haywood Pao, MD [place],  [place] [id number]  HISTORICAL INFORMATION:   Selected notes from the MEDICAL RECORD NUMBER       CURRENT MEDICATIONS: Current Outpatient Medications (Ophthalmic Drugs)  Medication Sig   brimonidine (ALPHAGAN) 0.15 % ophthalmic solution Place 1 drop into the right eye every 8 (eight) hours.   dorzolamide (TRUSOPT) 2 % ophthalmic solution Place 1 drop into the right eye 3 (three) times daily.   latanoprost (XALATAN) 0.005 % ophthalmic solution Place 1 drop into the right eye daily.   loteprednol (LOTEMAX) 0.5 % ophthalmic suspension Place 1 drop into the right eye daily.   ketorolac (ACULAR) 0.5 % ophthalmic solution Place 1 drop into the right eye 4 (four) times daily.   Polyethyl Glycol-Propyl Glycol (SYSTANE OP) Apply 1 drop to eye every morning.   prednisoLONE acetate (PRED FORTE) 1 % ophthalmic suspension Place 1 drop into the right eye 4 (four) times daily.   Propylene Glycol 0.6 % SOLN Apply 1 drop to eye in the morning.   No current facility-administered medications for this visit. (Ophthalmic Drugs)   Current Outpatient Medications (Other)  Medication Sig   albuterol (VENTOLIN HFA) 108 (90 Base) MCG/ACT inhaler Inhale 2 puffs into the lungs every 4 (four) hours as needed for wheezing or shortness of breath.   ALPRAZolam (XANAX) 0.25 MG tablet Take 0.25 mg by mouth at bedtime as  needed for sleep.    Ascorbic Acid (VITAMIN C PO) Take 1 tablet by mouth every morning. Not sure the dosage   azelastine (ASTELIN) 0.1 % nasal spray Place 1 spray into both nostrils 2 (two) times daily.   budesonide-formoterol (SYMBICORT) 160-4.5 MCG/ACT inhaler Inhale 2 puffs into the lungs in the morning and at bedtime.   Calcium Carbonate-Vitamin D (CALCIUM-VITAMIN D) 600-125 MG-UNIT TABS Take 1 tablet by mouth 2 (two) times daily.    diltiazem (CARDIZEM CD) 180 MG 24 hr capsule TAKE 1 CAPSULE EVERY       MORNING AND AT BEDTIME   ELIQUIS 5 MG TABS tablet TAKE 1 TABLET TWICE A DAY   EPINEPHrine 0.3 mg/0.3 mL IJ SOAJ injection SMARTSIG:0.3 Milligram(s) IM Once   esomeprazole (NEXIUM) 40 MG capsule Take 1 capsule (40 mg total) by mouth 2 (two) times daily.   Ferrous Sulfate (SLOW FE PO) Take 1 tablet by mouth every morning.   fluorouracil (EFUDEX) 5 % cream SMARTSIG:sparingly Topical Twice Daily   levocetirizine (XYZAL) 5 MG tablet Take 1 tablet (5 mg total) by mouth 2 (two) times daily as needed for allergies (Can take an extra dose during flare ups.).   levothyroxine (SYNTHROID, LEVOTHROID) 75 MCG tablet Take 75 mcg by mouth daily before breakfast.   montelukast (SINGULAIR) 10 MG tablet Take 1 tablet (10 mg total) by mouth at bedtime.  Multiple Vitamin (MULTIVITAMIN WITH MINERALS) TABS tablet Take 1 tablet daily by mouth.   Multiple Vitamins-Minerals (OCUVITE PRESERVISION PO) Take 1 tablet 2 (two) times daily by mouth.   rosuvastatin (CRESTOR) 5 MG tablet Take 1 tablet (5 mg total) by mouth daily.   Current Facility-Administered Medications (Other)  Medication Route   Benralizumab SOSY 30 mg Subcutaneous      REVIEW OF SYSTEMS: ROS   Negative for: Constitutional, Gastrointestinal, Neurological, Skin, Genitourinary, Musculoskeletal, HENT, Endocrine, Cardiovascular, Eyes, Respiratory, Psychiatric, Allergic/Imm, Heme/Lymph Last edited by Silvestre Moment on 09/30/2021  3:00 PM.        ALLERGIES Allergies  Allergen Reactions   Other Other (See Comments)    Latex Gloves   Penicillins Hives    Has patient had a PCN reaction causing immediate rash, facial/tongue/throat swelling, SOB or lightheadedness with hypotension: No Has patient had a PCN reaction causing severe rash involving mucus membranes or skin necrosis: No Has patient had a PCN reaction that required hospitalization: No Has patient had a PCN reaction occurring within the last 10 years: Yes If all of the above answers are "NO", then may proceed with Cephalosporin use.    Sulfa Antibiotics Other (See Comments)    Unknown to patient    Sulfamethoxazole-Trimethoprim Other (See Comments)   Latex Hives and Rash    PAST MEDICAL HISTORY Past Medical History:  Diagnosis Date   Anxiety    Asthma    Asthmatic bronchitis    Atrial flutter ([place])    chads2vasc score is 3   Ehrlichiosis 8469   GERD (gastroesophageal reflux disease)    Granulomatosis with polyangiitis (HCC)    HTN (hypertension)    Hyperlipidemia    Nuclear sclerotic cataract of left eye 06/04/2019   Cataract surgery completed March 2023, Dr. [name]   Paroxysmal atrial fibrillation [facility])    Prostate cancer ([place])    Seasonal allergies    Past Surgical History:  Procedure Laterality Date   BASAL CELL CARCINOMA EXCISION  2013   EYELID   HERNIA REPAIR Bilateral 2000, 2002   KNEE ARTHROSCOPY  2000   prostectomy  1995   RADICAL   TONSILLECTOMY  1940    FAMILY HISTORY Family History  Problem Relation Age of Onset   Asthma Mother 25   Allergies Mother    Ulcers Mother    CVA Father 70   Heart Problems Father    Other Father        NEUROLOGIC ISSUES    Other Brother 50       BICYCLE ACCIDENT   Migraines Son    Diabetes Mellitus I Son     SOCIAL HISTORY Social History   Tobacco Use   Smoking status: Former    Packs/day: 0.50    Years: 4.00    Total pack years: 2.00    Types: Cigarettes    Quit date: 02/28/1961    Years  since quitting: 60.6   Smokeless tobacco: Never  Vaping Use   Vaping Use: Never used  Substance Use Topics   Alcohol use: Yes    Alcohol/week: 2.0 - 3.0 standard drinks of alcohol    Types: 2 - 3 Standard drinks or equivalent per week   Drug use: No         OPHTHALMIC EXAM:  Base Eye Exam     Visual Acuity (ETDRS)       Right Left   Dist [place] 20/150 20/25 -2   Dist ph [place] NI  Tonometry (Tonopen, 3:05 PM)       Right Left   Pressure 49 13         Pupils       Pupils APD   Right PERRL None   Left PERRL None         Visual Fields       Left Right    Full Full         Extraocular Movement       Right Left    Full, Ortho Full, Ortho         Neuro/Psych     Oriented x3: Yes   Mood/Affect: Normal         Dilation     Right eye: 2.5% Phenylephrine, 1.0% Mydriacyl @ 3:05 PM           Slit Lamp and Fundus Exam     External Exam       Right Left   External Normal Normal         Slit Lamp Exam       Right Left   Lids/Lashes Normal Normal   Conjunctiva/Sclera White and quiet White and quiet   Cornea Mild corneal edema, which cleared nicely within 10 minutes after AC tap performed to a clear cornea Clear   Anterior Chamber Deep and quiet has not, no remaining vitreous strand to the cornea Deep and quiet   Iris Round and reactive Round and reactive   Lens Posterior chamber intraocular lens, open PC slightly decentered superotemporal, no vitreous prolapse around the IOL 3+ Nuclear sclerosis   Anterior Vitreous Normal Normal         Fundus Exam       Right Left   Posterior Vitreous Clear, avitric    Disc Normal    C/D Ratio 0.65    Macula Much less topographic distortion subfoveal nodular material remains    Vessels Normal    Periphery Normal             IMAGING AND PROCEDURES  Imaging and Procedures for 09/30/21  Color Fundus Photography Optos - OU - Both Eyes       OD hazy view     Aspiration of  Aqueous, Diagnostic - OD - Right Eye       OD anesthetized with topical proparacaine followed thereafter by Tobrex, Ocuflox and then finally Tobrex again.  Lid speculum applied.  0.12cc of clear aqueous aspirated.  Atraumatically.  Anterior chamber remained formed.             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  Likely a transient phenomenon.  We will treat maximum tolerated medical therapy without beta-blocker due to significant asthma  ICD-10-CM   1. Secondary glaucoma due to combination mechanisms, right, severe stage  H40.51X3 Aspiration of Aqueous, Diagnostic - OD - Right Eye    2. Cystoid macular edema of right eye  H35.351 Color Fundus Photography Optos - OU - Both Eyes    3. Glaucomatocyclitic crisis, severe stage, unspecified laterality  H40.40X3    H20.9     4. Corneal edema, idiopathic, right  H18.221       1.  2.  3.  Ophthalmic Meds Ordered this visit:  Meds ordered this encounter  Medications   brimonidine (ALPHAGAN) 0.15 % ophthalmic solution    Sig: Place 1 drop into the right eye every 8 (eight)  hours.    Dispense:  5 mL    Refill:  1   latanoprost (XALATAN) 0.005 % ophthalmic solution    Sig: Place 1 drop into the right eye daily.    Dispense:  2.5 mL    Refill:  1   dorzolamide (TRUSOPT) 2 % ophthalmic solution    Sig: Place 1 drop into the right eye 3 (three) times daily.    Dispense:  10 mL    Refill:  1   loteprednol (LOTEMAX) 0.5 % ophthalmic suspension    Sig: Place 1 drop into the right eye daily.    Dispense:  5 mL    Refill:  1       Return in about 5 days (around 10/05/2021) for NO DILATE.  Patient Instructions  1.  Discontinue ketorolac OD  2.  Commence latanoprost (Xalatan) 1 drop nightly right eye  3.  Commence Alphagan 1 drop right eye 3 times daily while awake  4.  Commence with dorzolamide 1 drop right eye 3 times daily (orange top) while awake, if itching or laying or throat swelling develops do not use this drop, has mild sulfa allergy overlay  5.,  Lotemax 1 drop right eye once daily.  Do not rub mash compress the right eye.  No heavy lifting bending or straining today.  Explained the diagnoses, plan, and follow up with the patient and they expressed understanding.  Patient expressed understanding of the importance of proper follow up care.   Clent Demark Dontrae Morini M.D. Diseases & Surgery of the Retina and Vitreous Retina & Diabetic [place] 09/30/21     Abbreviations: M myopia (nearsighted); A astigmatism; H hyperopia (farsighted); P presbyopia; Mrx spectacle prescription;  CTL contact lenses; OD right eye; OS left eye; OU both eyes  XT exotropia; ET esotropia; PEK punctate epithelial keratitis; PEE punctate epithelial erosions; DES dry eye syndrome; MGD meibomian gland dysfunction; ATs artificial tears; PFAT's preservative free artificial tears; [place] nuclear sclerotic cataract; PSC posterior subcapsular cataract; ERM epi-retinal membrane; PVD posterior vitreous detachment; RD retinal detachment; DM diabetes mellitus; DR diabetic retinopathy; NPDR  non-proliferative diabetic retinopathy; PDR proliferative diabetic retinopathy; CSME clinically significant macular edema; DME diabetic macular edema; dbh dot blot hemorrhages; CWS cotton wool spot; POAG primary open angle glaucoma; C/D cup-to-disc ratio; HVF humphrey visual field; GVF goldmann visual field; OCT optical coherence tomography; IOP intraocular pressure; BRVO Branch retinal vein occlusion; CRVO central retinal vein occlusion; CRAO central retinal artery occlusion; BRAO branch retinal artery occlusion; RT retinal tear; SB scleral buckle; PPV pars plana vitrectomy; VH Vitreous hemorrhage; PRP panretinal laser photocoagulation; IVK intravitreal kenalog; VMT vitreomacular traction; MH Macular hole;  NVD neovascularization of the disc; NVE neovascularization elsewhere; AREDS age related eye disease study; ARMD age related macular degeneration; POAG primary open angle glaucoma; EBMD epithelial/anterior  basement membrane dystrophy; ACIOL anterior chamber intraocular lens; IOL intraocular lens; PCIOL posterior chamber intraocular lens; Phaco/IOL phacoemulsification with intraocular lens placement; [place] photorefractive keratectomy; LASIK laser assisted in situ keratomileusis; HTN hypertension; DM diabetes mellitus; COPD chronic obstructive pulmonary disease

## 2021-10-05 ENCOUNTER — Ambulatory Visit (INDEPENDENT_AMBULATORY_CARE_PROVIDER_SITE_OTHER): Payer: Medicare Other | Admitting: Ophthalmology

## 2021-10-05 ENCOUNTER — Encounter (INDEPENDENT_AMBULATORY_CARE_PROVIDER_SITE_OTHER): Payer: Self-pay | Admitting: Ophthalmology

## 2021-10-05 DIAGNOSIS — H4051X3 Glaucoma secondary to other eye disorders, right eye, severe stage: Secondary | ICD-10-CM | POA: Diagnosis not present

## 2021-10-05 DIAGNOSIS — H4041X2 Glaucoma secondary to eye inflammation, right eye, moderate stage: Secondary | ICD-10-CM | POA: Diagnosis not present

## 2021-10-05 DIAGNOSIS — H35351 Cystoid macular degeneration, right eye: Secondary | ICD-10-CM | POA: Diagnosis not present

## 2021-10-05 NOTE — Patient Instructions (Signed)
Continue latanoprost 1 drop nightly right eye  Continue Lotemax 1 drop  to use every other day (Pink)  Dorzolamide, (orange) 1 drop right eye twice daily  Alphagan (purple top) 1 drop right eye twice daily

## 2021-10-05 NOTE — Progress Notes (Signed)
10/05/2021     CHIEF COMPLAINT Patient presents for  Chief Complaint  Patient presents with   Retina Follow Up    And follow-up of glaucoma no cyclic crisis, positive Schlossman syndrome right eye.  HISTORY OF PRESENT ILLNESS: Chad Morrison is a 88 y.o. male who presents to the clinic today for:   HPI     Retina Follow Up           Diagnosis: Other   Laterality: right eye   Severity: moderate   Course: gradually improving         Comments   5 days for NO DILATE. Pt stated vision has improved.  Pt claims, "when I would use the bathroom at 4am, I would see some cloudiness but when I wake up again around 7am, I would just get up and walk to the bathroom. I didn't notice any cloudiness."        Last edited by Silvestre Moment on 10/05/2021  3:08 PM.      Referring physician: Haywood Pao, MD [place],  [place] [id number]  HISTORICAL INFORMATION:   Selected notes from the [place]: Current Outpatient Medications (Ophthalmic Drugs)  Medication Sig   brimonidine (ALPHAGAN) 0.15 % ophthalmic solution Place 1 drop into the right eye every 8 (eight) hours.   dorzolamide (TRUSOPT) 2 % ophthalmic solution Place 1 drop into the right eye 3 (three) times daily.   latanoprost (XALATAN) 0.005 % ophthalmic solution Place 1 drop into the right eye daily.   loteprednol (LOTEMAX) 0.5 % ophthalmic suspension Place 1 drop into the right eye daily.   Polyethyl Glycol-Propyl Glycol (SYSTANE OP) Apply 1 drop to eye every morning.   Propylene Glycol 0.6 % SOLN Apply 1 drop to eye in the morning.   No current facility-administered medications for this visit. (Ophthalmic Drugs)   Current Outpatient Medications (Other)  Medication Sig   albuterol (VENTOLIN HFA) 108 (90 Base) MCG/ACT inhaler Inhale 2 puffs into the lungs every 4 (four) hours as needed for wheezing or shortness of breath.   ALPRAZolam (XANAX) 0.25 MG tablet Take 0.25 mg by  mouth at bedtime as needed for sleep.    Ascorbic Acid (VITAMIN C PO) Take 1 tablet by mouth every morning. Not sure the dosage   azelastine (ASTELIN) 0.1 % nasal spray Place 1 spray into both nostrils 2 (two) times daily.   budesonide-formoterol (SYMBICORT) 160-4.5 MCG/ACT inhaler Inhale 2 puffs into the lungs in the morning and at bedtime.   Calcium Carbonate-Vitamin D (CALCIUM-VITAMIN D) 600-125 MG-UNIT TABS Take 1 tablet by mouth 2 (two) times daily.    diltiazem (CARDIZEM CD) 180 MG 24 hr capsule TAKE 1 CAPSULE EVERY       MORNING AND AT BEDTIME   ELIQUIS 5 MG TABS tablet TAKE 1 TABLET TWICE A DAY   EPINEPHrine 0.3 mg/0.3 mL IJ SOAJ injection SMARTSIG:0.3 Milligram(s) IM Once   esomeprazole (NEXIUM) 40 MG capsule Take 1 capsule (40 mg total) by mouth 2 (two) times daily.   Ferrous Sulfate (SLOW FE PO) Take 1 tablet by mouth every morning.   fluorouracil (EFUDEX) 5 % cream SMARTSIG:sparingly Topical Twice Daily   levocetirizine (XYZAL) 5 MG tablet Take 1 tablet (5 mg total) by mouth 2 (two) times daily as needed for allergies (Can take an extra dose during flare ups.).   levothyroxine (SYNTHROID, LEVOTHROID) 75 MCG tablet Take 75 mcg by mouth daily before breakfast.  montelukast (SINGULAIR) 10 MG tablet Take 1 tablet (10 mg total) by mouth at bedtime.   Multiple Vitamin (MULTIVITAMIN WITH MINERALS) TABS tablet Take 1 tablet daily by mouth.   Multiple Vitamins-Minerals (OCUVITE PRESERVISION PO) Take 1 tablet 2 (two) times daily by mouth.   rosuvastatin (CRESTOR) 5 MG tablet Take 1 tablet (5 mg total) by mouth daily.   Current Facility-Administered Medications (Other)  Medication Route   Benralizumab SOSY 30 mg Subcutaneous      REVIEW OF SYSTEMS: ROS   Negative for: Constitutional, Gastrointestinal, Neurological, Skin, Genitourinary, Musculoskeletal, HENT, Endocrine, Cardiovascular, Eyes, Respiratory, Psychiatric, Allergic/Imm, Heme/Lymph Last edited by Silvestre Moment on 10/05/2021  3:07 PM.        ALLERGIES Allergies  Allergen Reactions   Other Other (See Comments)    Latex Gloves   Penicillins Hives    Has patient had a PCN reaction causing immediate rash, facial/tongue/throat swelling, SOB or lightheadedness with hypotension: No Has patient had a PCN reaction causing severe rash involving mucus membranes or skin necrosis: No Has patient had a PCN reaction that required hospitalization: No Has patient had a PCN reaction occurring within the last 10 years: Yes If all of the above answers are "NO", then may proceed with Cephalosporin use.    Sulfa Antibiotics Other (See Comments)    Unknown to patient    Sulfamethoxazole-Trimethoprim Other (See Comments)   Latex Hives and Rash    PAST MEDICAL HISTORY Past Medical History:  Diagnosis Date   Anxiety    Asthma    Asthmatic bronchitis    Atrial flutter ([place])    chads2vasc score is 3   Ehrlichiosis 7616   GERD (gastroesophageal reflux disease)    Granulomatosis with polyangiitis (HCC)    HTN (hypertension)    Hyperlipidemia    Nuclear sclerotic cataract of left eye 06/04/2019   Cataract surgery completed March 2023, Dr. [name]   Paroxysmal atrial fibrillation [facility])    Prostate cancer ([place])    Seasonal allergies    Past Surgical History:  Procedure Laterality Date   BASAL CELL CARCINOMA EXCISION  2013   EYELID   HERNIA REPAIR Bilateral 2000, 2002   KNEE ARTHROSCOPY  2000   prostectomy  1995   RADICAL   TONSILLECTOMY  1940    FAMILY HISTORY Family History  Problem Relation Age of Onset   Asthma Mother 6   Allergies Mother    Ulcers Mother    CVA Father 39   Heart Problems Father    Other Father        NEUROLOGIC ISSUES    Other Brother 31       BICYCLE ACCIDENT   Migraines Son    Diabetes Mellitus I Son     SOCIAL HISTORY Social History   Tobacco Use   Smoking status: Former    Packs/day: 0.50    Years: 4.00    Total pack years: 2.00    Types: Cigarettes    Quit date: 02/28/1961     Years since quitting: 60.6   Smokeless tobacco: Never  Vaping Use   Vaping Use: Never used  Substance Use Topics   Alcohol use: Yes    Alcohol/week: 2.0 - 3.0 standard drinks of alcohol    Types: 2 - 3 Standard drinks or equivalent per week   Drug use: No         OPHTHALMIC EXAM:  Base Eye Exam     Visual Acuity (ETDRS)       Right Left  Dist [place] 20/50 -1 20/25 -1   Dist ph [place] NI          Tonometry (Tonopen, 3:14 PM)       Right Left   Pressure 10 11         Visual Fields       Left Right    Full Full         Extraocular Movement       Right Left    Full, Ortho Full, Ortho         Neuro/Psych     Oriented x3: Yes   Mood/Affect: Normal         Dilation     Both eyes: 1.0% Mydriacyl @ 3:14 PM           Slit Lamp and Fundus Exam     External Exam       Right Left   External Normal Normal         Slit Lamp Exam       Right Left   Lids/Lashes Normal Normal   Conjunctiva/Sclera White and quiet White and quiet   Cornea Clear Clear   Anterior Chamber Deep, quiet no cells no flare Deep and quiet   Iris Round and reactive Round and reactive   Lens Posterior chamber intraocular lens, open PC slightly decentered superotemporal, no vitreous prolapse around the IOL 3+ Nuclear sclerosis   Anterior Vitreous Normal Normal            IMAGING AND PROCEDURES  Imaging and Procedures for 10/05/21           ASSESSMENT/PLAN:  Posner-Schlossman syndrome Rapid response to multi Motl topical glaucoma therapy and cessation of NSAID and once daily Lotemax speak to this clinical diagnosis likely a Pozner Schlossman syndrome.  No anterior segment inflammation is present.  Improved visual acuity improved    Cystoid macular edema of right eye Will reassess in the coming weeks  Secondary glaucoma due to combination mechanisms, right, severe stage Intraocular pressure much improved.  Will taper slightly dorzolamide and Alphagan.  We  will also avoid NSAIDs for now.  We will also taper Lotemax to 1 drop every other day     ICD-10-CM   1. Glaucomatocyclitic crisis of right eye, moderate stage  H40.41X2     2. Cystoid macular edema of right eye  H35.351     3. Secondary glaucoma due to combination mechanisms, right, severe stage  H40.51X3       1 OD doing very well.  Intraocular pressure responded and now normalized.  We will begin a slow taper of topical glaucoma lowering agents.  See instructions.  2.  Clearing vision.  We will monitor for CME resolution in the future.  Off of NSAIDs for now.  3.  OD, will use Lotemax 1 drop every other day.  Ophthalmic Meds Ordered this visit:  No orders of the defined types were placed in this encounter.      Return in about 3 weeks (around 10/26/2021) for dilate, OD, OCT.  Patient Instructions  Continue latanoprost 1 drop nightly right eye  Continue Lotemax 1 drop  to use every other day (Pink)  Dorzolamide, (orange) 1 drop right eye twice daily  Alphagan (purple top) 1 drop right eye twice daily   Explained the diagnoses, plan, and follow up with the patient and they expressed understanding.  Patient expressed understanding of the importance of proper follow up care.   Clent Demark Tenessa Marsee M.D. Diseases &  Surgery of the Retina and Vitreous Retina & Diabetic [place] 10/05/21     Abbreviations: M myopia (nearsighted); A astigmatism; H hyperopia (farsighted); P presbyopia; Mrx spectacle prescription;  CTL contact lenses; OD right eye; OS left eye; OU both eyes  XT exotropia; ET esotropia; PEK punctate epithelial keratitis; PEE punctate epithelial erosions; DES dry eye syndrome; MGD meibomian gland dysfunction; ATs artificial tears; PFAT's preservative free artificial tears; [place] nuclear sclerotic cataract; PSC posterior subcapsular cataract; ERM epi-retinal membrane; PVD posterior vitreous detachment; RD retinal detachment; DM diabetes mellitus; DR diabetic retinopathy;  NPDR non-proliferative diabetic retinopathy; PDR proliferative diabetic retinopathy; CSME clinically significant macular edema; DME diabetic macular edema; dbh dot blot hemorrhages; CWS cotton wool spot; POAG primary open angle glaucoma; C/D cup-to-disc ratio; HVF humphrey visual field; GVF goldmann visual field; OCT optical coherence tomography; IOP intraocular pressure; BRVO Branch retinal vein occlusion; CRVO central retinal vein occlusion; CRAO central retinal artery occlusion; BRAO branch retinal artery occlusion; RT retinal tear; SB scleral buckle; PPV pars plana vitrectomy; VH Vitreous hemorrhage; PRP panretinal laser photocoagulation; IVK intravitreal kenalog; VMT vitreomacular traction; MH Macular hole;  NVD neovascularization of the disc; NVE neovascularization elsewhere; AREDS age related eye disease study; ARMD age related macular degeneration; POAG primary open angle glaucoma; EBMD epithelial/anterior basement membrane dystrophy; ACIOL anterior chamber intraocular lens; IOL intraocular lens; PCIOL posterior chamber intraocular lens; Phaco/IOL phacoemulsification with intraocular lens placement; [place] photorefractive keratectomy; LASIK laser assisted in situ keratomileusis; HTN hypertension; DM diabetes mellitus; COPD chronic obstructive pulmonary disease

## 2021-10-05 NOTE — Assessment & Plan Note (Signed)
Rapid response to multi Motl topical glaucoma therapy and cessation of NSAID and once daily Lotemax speak to this clinical diagnosis likely a Pozner Schlossman syndrome.  No anterior segment inflammation is present.  Improved visual acuity improved

## 2021-10-05 NOTE — Assessment & Plan Note (Signed)
Intraocular pressure much improved.  Will taper slightly dorzolamide and Alphagan.  We will also avoid NSAIDs for now.  We will also taper Lotemax to 1 drop every other day

## 2021-10-05 NOTE — Assessment & Plan Note (Signed)
Will reassess in the coming weeks

## 2021-10-06 ENCOUNTER — Encounter (INDEPENDENT_AMBULATORY_CARE_PROVIDER_SITE_OTHER): Payer: Medicare Other | Admitting: Ophthalmology

## 2021-10-07 ENCOUNTER — Encounter (INDEPENDENT_AMBULATORY_CARE_PROVIDER_SITE_OTHER): Payer: Medicare Other | Admitting: Ophthalmology

## 2021-10-07 ENCOUNTER — Encounter (INDEPENDENT_AMBULATORY_CARE_PROVIDER_SITE_OTHER): Payer: Self-pay | Admitting: Ophthalmology

## 2021-10-07 ENCOUNTER — Ambulatory Visit (INDEPENDENT_AMBULATORY_CARE_PROVIDER_SITE_OTHER): Payer: Medicare Other | Admitting: Ophthalmology

## 2021-10-07 DIAGNOSIS — H4041X2 Glaucoma secondary to eye inflammation, right eye, moderate stage: Secondary | ICD-10-CM

## 2021-10-07 NOTE — Patient Instructions (Addendum)
Discontinue dorzolamide (orange top)  Continue on latanoprost 1 drop right eye nightly  Continue brimonidine 1 drop right eye twice daily (purple top)  Continue on Lotemax 1 drop every other day even numbered days of the year (pink top)

## 2021-10-07 NOTE — Assessment & Plan Note (Signed)
Brief evanescent pain, which did resolve.  Patient continued with sort of a heaviness in the eye.  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  The pressure is normal.  Will taper the medications to dorzolamide discontinuation and continuing brimonidine twice daily, latanoprost once nightly and let Lotemax 1 drop every other day even on the days of the calendar

## 2021-10-07 NOTE — Progress Notes (Signed)
10/07/2021     CHIEF COMPLAINT Patient presents for  Chief Complaint  Patient presents with   Eye Pain      HISTORY OF PRESENT ILLNESS: Chad Morrison is a 88 y.o. male who presents to the clinic today for:   HPI     Eye Pain           Laterality: In right eye   Quality: aching, sharp pain and pain with eye movement   Pain scale: 1/10   Timing: in the morning   Duration: 2 days   Associated symptoms: Negative for sinusitis   Treatments tried: artificial tears         Comments   WIP- sharp pains, aches od Pt states his vision has been stable  Pt denies any new floaters or FOL  Pt states he is having a aching pain on the lateral side of his eye that started yesterday morning so he tried some eye drops bc he felt that his eye was dry and those drops helped some but now he is feeling an ache in the right eye not as intense as it was before.       Last edited by Morene Rankins, CMA on 10/07/2021  1:55 PM.      Referring physician: Haywood Pao, MD [place],   [id number]  HISTORICAL INFORMATION:   Selected notes from the MEDICAL RECORD NUMBER       CURRENT MEDICATIONS: Current Outpatient Medications (Ophthalmic Drugs)  Medication Sig   brimonidine (ALPHAGAN) 0.15 % ophthalmic solution Place 1 drop into the right eye every 8 (eight) hours.   ketorolac (ACULAR) 0.5 % ophthalmic solution Place 1 drop into the right eye 3 (three) times daily.   loteprednol (LOTEMAX) 0.5 % ophthalmic suspension Place 1 drop into the right eye daily.   Polyethyl Glycol-Propyl Glycol (SYSTANE OP) Apply 1 drop to eye every morning. (Patient not taking: Reported on 10/22/2021)   Propylene Glycol 0.6 % SOLN Apply 1 drop to eye in the morning.   No current facility-administered medications for this visit. (Ophthalmic Drugs)   Current Outpatient Medications (Other)  Medication Sig   albuterol (VENTOLIN HFA) 108 (90 Base) MCG/ACT inhaler Inhale 2 puffs into the  lungs every 4 (four) hours as needed for wheezing or shortness of breath.   ALPRAZolam (XANAX) 0.25 MG tablet Take 0.25 mg by mouth at bedtime as needed for sleep.    Ascorbic Acid (VITAMIN C PO) Take 1 tablet by mouth every morning. Not sure the dosage   azelastine (ASTELIN) 0.1 % nasal spray Place 1 spray into both nostrils 2 (two) times daily.   Benralizumab (FASENRA [place]) Inject into the skin every 8 (eight) weeks.   budesonide-formoterol (SYMBICORT) 160-4.5 MCG/ACT inhaler Inhale 2 puffs into the lungs in the morning and at bedtime.   Calcium Carbonate-Vitamin D (CALCIUM-VITAMIN D) 600-125 MG-UNIT TABS Take 1 tablet by mouth 2 (two) times daily.    diltiazem (CARDIZEM CD) 180 MG 24 hr capsule TAKE 1 CAPSULE EVERY       MORNING AND AT BEDTIME   ELIQUIS 5 MG TABS tablet TAKE 1 TABLET TWICE A DAY   EPINEPHrine 0.3 mg/0.3 mL IJ SOAJ injection SMARTSIG:0.3 Milligram(s) IM Once   esomeprazole (NEXIUM) 40 MG capsule Take 1 capsule (40 mg total) by mouth 2 (two) times daily.   Ferrous Sulfate (SLOW FE PO) Take 1 tablet by mouth every morning.   fluorouracil (EFUDEX) 5 % cream SMARTSIG:sparingly Topical Twice Daily  levocetirizine (XYZAL) 5 MG tablet Take 1 tablet (5 mg total) by mouth 2 (two) times daily as needed for allergies (Can take an extra dose during flare ups.).   levothyroxine (SYNTHROID, LEVOTHROID) 75 MCG tablet Take 75 mcg by mouth daily before breakfast.   montelukast (SINGULAIR) 10 MG tablet Take 1 tablet (10 mg total) by mouth at bedtime.   Multiple Vitamin (MULTIVITAMIN WITH MINERALS) TABS tablet Take 1 tablet daily by mouth.   Multiple Vitamins-Minerals (OCUVITE PRESERVISION PO) Take 1 tablet 2 (two) times daily by mouth.   rosuvastatin (CRESTOR) 5 MG tablet Take 1 tablet (5 mg total) by mouth daily.   Current Facility-Administered Medications (Other)  Medication Route   Benralizumab SOSY 30 mg Subcutaneous      REVIEW OF SYSTEMS: ROS   Negative for: Constitutional,  Gastrointestinal, Neurological, Skin, Genitourinary, Musculoskeletal, HENT, Endocrine, Cardiovascular, Eyes, Respiratory, Psychiatric, Allergic/Imm, Heme/Lymph Last edited by Morene Rankins, CMA on 10/07/2021  1:55 PM.       ALLERGIES Allergies  Allergen Reactions   Other Other (See Comments)    Latex Gloves   Penicillins Hives    Has patient had a PCN reaction causing immediate rash, facial/tongue/throat swelling, SOB or lightheadedness with hypotension: No Has patient had a PCN reaction causing severe rash involving mucus membranes or skin necrosis: No Has patient had a PCN reaction that required hospitalization: No Has patient had a PCN reaction occurring within the last 10 years: Yes If all of the above answers are "NO", then may proceed with Cephalosporin use.    Sulfa Antibiotics Other (See Comments)    Unknown to patient    Sulfamethoxazole-Trimethoprim Other (See Comments)   Latex Hives and Rash    PAST MEDICAL HISTORY Past Medical History:  Diagnosis Date   Anxiety    Asthma    Asthmatic bronchitis    Atrial flutter ([place])    chads2vasc score is 3   Ehrlichiosis 0086   GERD (gastroesophageal reflux disease)    Granulomatosis with polyangiitis (HCC)    HTN (hypertension)    Hyperlipidemia    Nuclear sclerotic cataract of left eye 06/04/2019   Cataract surgery completed March 2023, Dr. [name]   Paroxysmal atrial fibrillation [facility])    Prostate cancer ([place])    Seasonal allergies    Past Surgical History:  Procedure Laterality Date   BASAL CELL CARCINOMA EXCISION  2013   EYELID   HERNIA REPAIR Bilateral 2000, 2002   KNEE ARTHROSCOPY  2000   prostectomy  1995   RADICAL   TONSILLECTOMY  1940    FAMILY HISTORY Family History  Problem Relation Age of Onset   Asthma Mother 16   Allergies Mother    Ulcers Mother    CVA Father 37   Heart Problems Father    Other Father        NEUROLOGIC ISSUES    Other Brother 23       BICYCLE ACCIDENT   Migraines Son     Diabetes Mellitus I Son     SOCIAL HISTORY Social History   Tobacco Use   Smoking status: Former    Packs/day: 0.50    Years: 4.00    Total pack years: 2.00    Types: Cigarettes    Quit date: 02/28/1961    Years since quitting: 60.7   Smokeless tobacco: Never  Vaping Use   Vaping Use: Never used  Substance Use Topics   Alcohol use: Yes    Alcohol/week: 2.0 - 3.0 standard drinks of  alcohol    Types: 2 - 3 Standard drinks or equivalent per week   Drug use: No         OPHTHALMIC EXAM:  Base Eye Exam     Visual Acuity (ETDRS)       Right Left   Dist [place] 20/50 20/25         Tonometry (Tonopen, 1:57 PM)       Right Left   Pressure 14 15         Pupils       Pupils Shape APD   Right PERRL Round None   Left PERRL           Visual Fields       Left Right    Full Full         Extraocular Movement       Right Left    Full, Ortho Full, Ortho         Neuro/Psych     Oriented x3: Yes   Mood/Affect: Normal         Dilation     Right eye: 2.5% Phenylephrine, 1.0% Mydriacyl @ 1:58 PM           Slit Lamp and Fundus Exam     External Exam       Right Left   External Normal Normal         Slit Lamp Exam       Right Left   Lids/Lashes Normal Normal   Conjunctiva/Sclera White and quiet White and quiet   Cornea Clear Clear   Anterior Chamber Deep, quiet no cells no flare Deep and quiet   Iris Round and reactive Round and reactive   Lens Posterior chamber intraocular lens, open PC slightly decentered superotemporal, no vitreous prolapse around the IOL 3+ Nuclear sclerosis   Anterior Vitreous Normal Normal         Fundus Exam       Right Left   Posterior Vitreous Clear and avitric    Disc Normal    C/D Ratio 0.65    Macula Much less topographic distortion subfoveal nodular material remains    Vessels Normal    Periphery Normal             IMAGING AND PROCEDURES  Imaging and Procedures for 11/22/21  Color Fundus  Photography Optos - OU - Both Eyes       Right Eye Progression has been stable. Disc findings include normal observations. Macula : drusen. Vessels : normal observations. Periphery : normal observations.   Left Eye Progression has been stable. Disc findings include normal observations. Macula : drusen. Vessels : normal observations. Periphery : normal observations.   Notes Clear view             ASSESSMENT/PLAN:  Posner-Schlossman syndrome Brief evanescent pain, which did resolve.  Patient continued with sort of a heaviness in the eye.  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  The pressure is normal.  Will taper the medications to dorzolamide discontinuation and continuing brimonidine twice daily, latanoprost once nightly and let Lotemax 1 drop every other day even on the days of the calendar      ICD-10-CM   1. Glaucomatocyclitic crisis of right eye, moderate stage  H40.41X2 Color Fundus Photography Optos - OU - Both Eyes      1.  No active inflammation  continue to monitor  2.  3.  Ophthalmic Meds Ordered this visit:  No orders of the defined types were placed in this encounter.      Return in about 3 weeks (around 10/28/2021) for OCT, NO DILATE.  Patient Instructions  Discontinue dorzolamide (orange top)  Continue on latanoprost 1 drop right eye nightly  Continue brimonidine 1 drop right eye twice daily (purple top)  Continue on Lotemax 1 drop every other day even numbered days of the year (pink top)  Explained the diagnoses, plan, and follow up with the patient and they expressed understanding.  Patient expressed understanding of the importance of proper follow up care.   Clent Demark Arieonna Medine M.D. Diseases & Surgery of the Retina and Vitreous [facility] [place] 11/22/21     Abbreviations: M myopia  (nearsighted); A astigmatism; H hyperopia (farsighted); P presbyopia; Mrx spectacle prescription;  CTL contact lenses; OD right eye; OS left eye; OU both eyes  XT exotropia; ET esotropia; PEK punctate epithelial keratitis; PEE punctate epithelial erosions; DES dry eye syndrome; MGD meibomian gland dysfunction; ATs artificial tears; PFAT's preservative free artificial tears; [place] nuclear sclerotic cataract; PSC posterior subcapsular cataract; ERM epi-retinal membrane; PVD posterior vitreous detachment; RD retinal detachment; DM diabetes mellitus; DR diabetic retinopathy; NPDR non-proliferative diabetic retinopathy; PDR proliferative diabetic retinopathy; CSME clinically significant macular edema; DME diabetic macular edema; dbh dot blot hemorrhages; CWS cotton wool spot; POAG primary open angle glaucoma; C/D cup-to-disc ratio; HVF humphrey visual field; GVF goldmann visual field; OCT optical coherence tomography; IOP intraocular pressure; BRVO Branch retinal vein occlusion; CRVO central retinal vein occlusion; CRAO central retinal artery occlusion; BRAO branch retinal artery occlusion; RT retinal tear; SB scleral buckle; PPV pars plana vitrectomy; VH Vitreous hemorrhage; PRP panretinal laser photocoagulation; IVK intravitreal kenalog; VMT vitreomacular traction; MH Macular hole;  NVD neovascularization of the disc; NVE neovascularization elsewhere; AREDS age related eye disease study; ARMD age related macular degeneration; POAG primary open angle glaucoma; EBMD epithelial/anterior basement membrane dystrophy; ACIOL anterior chamber intraocular lens; IOL intraocular lens; PCIOL posterior chamber intraocular lens; Phaco/IOL phacoemulsification with intraocular lens placement; [place] photorefractive keratectomy; LASIK laser assisted in situ keratomileusis; HTN hypertension; DM diabetes mellitus; COPD chronic obstructive pulmonary disease

## 2021-10-13 DIAGNOSIS — C44529 Squamous cell carcinoma of skin of other part of trunk: Secondary | ICD-10-CM | POA: Diagnosis not present

## 2021-10-18 DIAGNOSIS — J4550 Severe persistent asthma, uncomplicated: Secondary | ICD-10-CM | POA: Diagnosis not present

## 2021-10-19 ENCOUNTER — Ambulatory Visit (INDEPENDENT_AMBULATORY_CARE_PROVIDER_SITE_OTHER): Payer: Medicare Other | Admitting: *Deleted

## 2021-10-19 DIAGNOSIS — J4550 Severe persistent asthma, uncomplicated: Secondary | ICD-10-CM | POA: Diagnosis not present

## 2021-10-19 NOTE — Progress Notes (Addendum)
Patient ID: Chad Morrison MRN: [id number] DOB/AGE: Aug 17, 1932 88 y.o.  Primary Care Physician:Tisovec, Fransico Him, MD Primary Cardiologist: Delilah Shan CARDIOVASCULAR PROBLEM LIST:   1.  Moderate to severe aortic stenosis with an aortic valve area of 0.79 cm, mean gradient of 33 mmHg, and peak velocity of 3.8 m/s; conduction: atrial fibrillation with right bundle branch block 2.  Atrial fibrillation on anticoagulation 3.  Hypertension 4.  Hyperlipidemia 5.  Wegener's granulomatosis on Fasenra immunotherapy   HISTORY OF PRESENT ILLNESS:  The patient returns for 56-monthfollow-up.  The patient was seen in April regarding his aortic valvular disease.  At that point in time he was he was doing relatively well and was asymptomatic with regards to his aortic stenosis.  The patient is here with his wife.  He continues to be very active.  [place] regular [place].  When he goes up hills he might have to stop but this is relatively unchanged versus prior.  He has required no emergency room visits or hospitalizations.  He denies any exertional angina, presyncope or syncope.  His biggest issue recently was left eye pain and this is being actively managed by ophthalmology.  Past Medical History:  Diagnosis Date   Anxiety    Asthma    Asthmatic bronchitis    Atrial flutter (HCC)    chads2vasc score is 3   Ehrlichiosis 28341  GERD (gastroesophageal reflux disease)    Granulomatosis with polyangiitis (HCC)    HTN (hypertension)    Hyperlipidemia    Nuclear sclerotic cataract of left eye 06/04/2019   Cataract surgery completed March 2023, Dr. BTalbert Forest  Paroxysmal atrial fibrillation ([facility]    Prostate cancer ([place]    Seasonal allergies     Past Surgical History:  Procedure Laterality Date   BASAL CELL CARCINOMA EXCISION  2013   EYELID   HERNIA REPAIR Bilateral 2000, 2002   KNEE ARTHROSCOPY  2000   prostectomy  1[place]   Family  History  Problem Relation Age of Onset   Asthma Mother 832  Allergies Mother    Ulcers Mother    CVA Father 53  Heart Problems Father    Other Father        NEUROLOGIC ISSUES    Other Brother 185      BICYCLE ACCIDENT   Migraines Son    Diabetes Mellitus I Son     Social History   Socioeconomic History   Marital status: Married    Spouse name: [name]   Number of children: 2   Years of education: Not on file   Highest education level: Not on file  Occupational History   Occupation: RETIRED  Tobacco Use   Smoking status: Former    Packs/day: 0.50    Years: 4.00    Total pack years: 2.00    Types: Cigarettes    Quit date: 02/28/1961    Years since quitting: 60.6   Smokeless tobacco: Never  Vaping Use   Vaping Use: Never used  Substance and Sexual Activity   Alcohol use: Yes    Alcohol/week: 2.0 - 3.0 standard drinks of alcohol    Types: 2 - 3 Standard drinks or equivalent per week   Drug use: No   Sexual activity: Not on file  Other Topics Concern   Not on file  Social History Narrative   [facility]  [place] to be near his son.   Retired.   Social Determinants of Health   Financial Resource Strain: Not on file  Food Insecurity: Not on file  Transportation Needs: Not on file  Physical Activity: Not on file  Stress: Not on file  Social Connections: Not on file  Intimate Partner Violence: Not on file     Prior to Admission medications   Medication Sig Start Date End Date Taking? Authorizing Provider  albuterol (VENTOLIN HFA) 108 (90 Base) MCG/ACT inhaler Inhale 2 puffs into the lungs every 4 (four) hours as needed for wheezing or shortness of breath. 06/22/21   Kozlow, Donnamarie Poag, MD  ALPRAZolam Duanne Moron) 0.25 MG tablet Take 0.25 mg by mouth at bedtime as needed for sleep.     [provider]  Ascorbic Acid (VITAMIN C PO) Take 1 tablet by mouth every morning. Not sure the dosage    [provider]  azelastine (ASTELIN)  0.1 % nasal spray Place 1 spray into both nostrils 2 (two) times daily. 03/31/21   [provider]  budesonide-formoterol (SYMBICORT) 160-4.5 MCG/ACT inhaler Inhale 2 puffs into the lungs in the morning and at bedtime. 06/22/21   Kozlow, Donnamarie Poag, MD  Calcium Carbonate-Vitamin D (CALCIUM-VITAMIN D) 600-125 MG-UNIT TABS Take 1 tablet by mouth 2 (two) times daily.     [provider]  diltiazem (CARDIZEM CD) 180 MG 24 hr capsule TAKE 1 CAPSULE EVERY       MORNING AND AT BEDTIME 02/23/21   Fenton, Clint R, PA  ELIQUIS 5 MG TABS tablet TAKE 1 TABLET TWICE A DAY 02/23/21   Fenton, Clint R, PA  EPINEPHrine 0.3 mg/0.3 mL IJ SOAJ injection SMARTSIG:0.3 Milligram(s) IM Once 06/22/21   Kozlow, Donnamarie Poag, MD  esomeprazole (NEXIUM) 40 MG capsule Take 1 capsule (40 mg total) by mouth 2 (two) times daily. 06/22/21   Kozlow, Donnamarie Poag, MD  Ferrous Sulfate (SLOW FE PO) Take 1 tablet by mouth every morning.    [provider]  fluorouracil (EFUDEX) 5 % cream SMARTSIG:sparingly Topical Twice Daily 05/18/21   [provider]  levocetirizine (XYZAL) 5 MG tablet Take 1 tablet (5 mg total) by mouth 2 (two) times daily as needed for allergies (Can take an extra dose during flare ups.). 06/22/21   Kozlow, Donnamarie Poag, MD  levothyroxine (SYNTHROID, LEVOTHROID) 75 MCG tablet Take 75 mcg by mouth daily before breakfast.    [provider]  montelukast (SINGULAIR) 10 MG tablet Take 1 tablet (10 mg total) by mouth at bedtime. 06/22/21   Kozlow, Donnamarie Poag, MD  Multiple Vitamin (MULTIVITAMIN WITH MINERALS) TABS tablet Take 1 tablet daily by mouth.    [provider]  Multiple Vitamins-Minerals (OCUVITE PRESERVISION PO) Take 1 tablet 2 (two) times daily by mouth.    [provider]  Polyethyl Glycol-Propyl Glycol (SYSTANE OP) Apply 1 drop to eye every morning.    [provider]  PROLENSA 0.07 % SOLN INSTILL 1 DROP INTO THE    RIGHT EYE DAILY 07/19/21   Rankin, Clent Demark, MD  Propylene  Glycol 0.6 % SOLN Apply 1 drop to eye in the morning.    [provider]  rosuvastatin (CRESTOR) 5 MG tablet TAKE 1 TABLET DAILY 01/19/21   Fenton, Clint R, PA    Allergies  Allergen Reactions   Other Other (See Comments)    Latex Gloves   Penicillins Hives    Has patient had a PCN reaction causing immediate rash, facial/tongue/throat swelling,  SOB or lightheadedness with hypotension: No Has patient had a PCN reaction causing severe rash involving mucus membranes or skin necrosis: No Has patient had a PCN reaction that required hospitalization: No Has patient had a PCN reaction occurring within the last 10 years: Yes If all of the above answers are "NO", then may proceed with Cephalosporin use.    Sulfa Antibiotics Other (See Comments)    Unknown to patient    Sulfamethoxazole-Trimethoprim Other (See Comments)   Latex Hives and Rash    REVIEW OF SYSTEMS:  General: no fevers/chills/night sweats Eyes: no blurry vision, diplopia, or amaurosis ENT: no sore throat or hearing loss Resp: no cough, wheezing, or hemoptysis CV: no edema or palpitations GI: no abdominal pain, nausea, vomiting, diarrhea, or constipation GU: no dysuria, frequency, or hematuria Skin: no rash Neuro: no headache, numbness, tingling, or weakness of extremities Musculoskeletal: no joint pain or swelling Heme: no bleeding, DVT, or easy bruising Endo: no polydipsia or polyuria  BP (!) 140/74   Pulse (!) 55   Ht '5\' 9"'$  (1.753 m)   Wt 150 lb (68 kg)   BMI 22.15 kg/m   PHYSICAL EXAM: GEN:  AO x 3 in no acute distress HEENT: normal Dentition: Normal Neck: JVP normal. +2 carotid upstrokes without bruits. No thyromegaly. Lungs: equal expansion, clear bilaterally CV: Apex is discrete and nondisplaced, RRR with 3/6 SEM Abd: soft, non-tender, non-distended; no bruit; positive bowel sounds Ext: no edema, ecchymoses, or cyanosis Vascular: 2+ femoral pulses, 2+ radial pulses       Skin: warm and dry  without rash Neuro: CN II-XII grossly intact; motor and sensory grossly intact    DATA AND STUDIES:  EKG: Atrial fibrillation with a right bundle branch block  2D ECHO: Echocardiogram demonstrates moderate to severe aortic stenosis with a heavily calcified valve with a mean gradient of 33 mmHg with no significant other valvular issues.  CARDIAC CATH: n/a  STS RISK CALCULATOR: pending  NHYA CLASS: 1    ASSESSMENT AND PLAN:   Nonrheumatic aortic valve stenosis - Plan: ECHOCARDIOGRAM COMPLETE  Atrial fibrillation, unspecified type ([place]) - Plan: ECHOCARDIOGRAM COMPLETE  The patient continues to be relatively asymptomatic.  He is able to do all of his activities of daily living with may be a slight limitation.  I reviewed his echocardiogram from May.  This demonstrates a heavily calcified valve with indices more suggestive of moderate aortic stenosis.  I will have our research division screen him for inclusion in the PROGRESS trial.  Right now he does not need consideration for intervention.  Certainly if he were to progress in terms of symptoms and did not demonstrate severe aortic stenosis inclusion in the trial will be considered.  I will see him back in 6 months with another echocardiogram.  He knows to contact us if he were to develop more symptoms prior to our appointment.   The patient is relatively asymptomatic in regards to his aortic stenosis.  This may be difficult to gauge since he does have issues with walking due to chronic leg pain.  At this point in time he has not developed symptoms that I can attribute to his aortic stenosis.  We had a long discussion about the pathophysiology of aortic stenosis and what he may expect later.  He is clear at this time his aortic stenosis is not limiting his lifestyle in any way.  I think it makes sense to monitor him for now.  We did speak about the evaluation process including  coronary angiography, CTA, and consultation with cardiothoracic  surgery.  I will see him back in 3 months to evaluate further.  I did tell him to contact our office if he develops any presyncope, syncope, exertional dyspnea, or exertional angina.  I have personally reviewed the patients imaging data as summarized above.  I have reviewed the natural history of aortic stenosis with the patient and family members who are present today. We have discussed the limitations of medical therapy and the poor prognosis associated with symptomatic aortic stenosis. We have also reviewed potential treatment options, including palliative medical therapy, conventional surgical aortic valve replacement, and transcatheter aortic valve replacement. We discussed treatment options in the context of this patient's specific comorbid medical conditions.   All of the patient's questions were answered today. Will make further recommendations based on the results of studies outlined above.   Total time spent with patient today 30 minutes. This includes reviewing records, evaluating the patient and coordinating care.   Addendum: Patient screened for PROGRESS trial and does not meet criteria.   Early Osmond, MD  10/22/2021 11:45 AM    [facility] [place], [place], [place]  [address] Phone: [phone]; Fax: [phone]

## 2021-10-22 ENCOUNTER — Ambulatory Visit (INDEPENDENT_AMBULATORY_CARE_PROVIDER_SITE_OTHER): Payer: Medicare Other | Admitting: Internal Medicine

## 2021-10-22 ENCOUNTER — Encounter: Payer: Self-pay | Admitting: Internal Medicine

## 2021-10-22 VITALS — BP 140/74 | HR 55 | Ht 69.0 in | Wt 150.0 lb

## 2021-10-22 DIAGNOSIS — I4891 Unspecified atrial fibrillation: Secondary | ICD-10-CM

## 2021-10-22 DIAGNOSIS — I350 Nonrheumatic aortic (valve) stenosis: Secondary | ICD-10-CM

## 2021-10-22 NOTE — Patient Instructions (Signed)
Medication Instructions:  NO CHANGES *If you need a refill on your cardiac medications before your next appointment, please call your pharmacy*   Lab Work: NONE If you have labs (blood work) drawn today and your tests are completely normal, you will receive your results only by: [place] (if you have MyChart) OR A paper copy in the mail If you have any lab test that is abnormal or we need to change your treatment, we will call you to review the results.   Testing/Procedures: Your physician has requested that you have an echocardiogram. Echocardiography is a painless test that uses sound waves to create images of your heart. It provides your doctor with information about the size and shape of your heart and how well your heart's chambers and valves are working. This procedure takes approximately one hour. There are no restrictions for this procedure. DUE IN 6 MONTHS   Follow-Up: At [facility], you and your health needs are our priority.  As part of our continuing mission to provide you with exceptional heart care, we have created designated Provider Care Teams.  These Care Teams include your primary Cardiologist (physician) and Advanced Practice Providers (APPs -  Physician Assistants and Nurse Practitioners) who all work together to provide you with the care you need, when you need it.  We recommend signing up for the patient portal called "MyChart".  Sign up information is provided on this After Visit Summary.  MyChart is used to connect with patients for Virtual Visits (Telemedicine).  Patients are able to view lab/test results, encounter notes, upcoming appointments, etc.  Non-urgent messages can be sent to your provider as well.   To learn more about what you can do with MyChart, go to [email or web address].    Your next appointment:   6 month(s)  The format for your next appointment:   In Person  Provider:   DR Ali Lowe Other Instructions NONE  Important  Information About Sugar

## 2021-10-28 ENCOUNTER — Ambulatory Visit (INDEPENDENT_AMBULATORY_CARE_PROVIDER_SITE_OTHER): Payer: Medicare Other | Admitting: Ophthalmology

## 2021-10-28 ENCOUNTER — Encounter (INDEPENDENT_AMBULATORY_CARE_PROVIDER_SITE_OTHER): Payer: Self-pay | Admitting: Ophthalmology

## 2021-10-28 DIAGNOSIS — H4041X2 Glaucoma secondary to eye inflammation, right eye, moderate stage: Secondary | ICD-10-CM | POA: Diagnosis not present

## 2021-10-28 DIAGNOSIS — H35351 Cystoid macular degeneration, right eye: Secondary | ICD-10-CM

## 2021-10-28 DIAGNOSIS — H04122 Dry eye syndrome of left lacrimal gland: Secondary | ICD-10-CM | POA: Diagnosis not present

## 2021-10-28 DIAGNOSIS — H35371 Puckering of macula, right eye: Secondary | ICD-10-CM

## 2021-10-28 MED ORDER — KETOROLAC TROMETHAMINE 0.5 % OP SOLN: 1.0000 [drp] | Freq: Three times a day (TID) | OPHTHALMIC | 0 refills | Status: DC

## 2021-10-28 NOTE — Assessment & Plan Note (Signed)
Doing very well ODU after vitrectomy membrane peel as a subfoveal nodular change at the level of the RPE continues to diminish in size from preoperative height and thickness of 245 m now down to 155 m

## 2021-10-28 NOTE — Assessment & Plan Note (Signed)
Continue on artificial tears.  Redness in the morning of the eye likely related to latanoprost use.

## 2021-10-28 NOTE — Patient Instructions (Signed)
Brimonidine (purple top) 1 drop right eye twice daily  Ketorolac (gray top) 1 drop right eye 3 times daily  Lotemax (Pink top) 1 drop every other day right eye.    Discontinue latanoprost and decrease orange top, dorzolamide if not already done

## 2021-10-28 NOTE — Assessment & Plan Note (Signed)
CME slightly worse now while also on continue latanoprost use we will discontinue latanoprost because of the possible mild uveitogenic and CME triggering responses and pseudophakic eyes.  Intraocular pressures are well controlled.  We will continue on apical Lotemax 1 drop every other day but also will readd topical ketorolac 1 drop 3 times daily to the right eye

## 2021-10-28 NOTE — Assessment & Plan Note (Signed)
No recurrence of elevated intraocular pressure and blurred vision.  We will taper the use of latanoprost because of the possible secondary CME side effect also discontinue dorzolamide

## 2021-10-28 NOTE — Progress Notes (Signed)
10/28/2021     CHIEF COMPLAINT Patient presents for  Chief Complaint  Patient presents with   Retina Follow Up      HISTORY OF PRESENT ILLNESS: Chad Morrison is a 88 y.o. male who presents to the clinic today for:   HPI     Retina Follow Up           Diagnosis: Other   Laterality: right eye   Severity: moderate   Course: stable         Comments   3 weeks for DILATE OD, OCT. Pt stated vision has been clear since last visit. However, pt reports, "In the morning when I put my drops in, my wife tod me that my right eye is red. I feel something and sometimes theres discomfort and achy. But it goes away an hr or two later."       Last edited by Silvestre Moment on 10/28/2021  2:13 PM.      Referring physician: Haywood Pao, MD [place],  [place] [address]  HISTORICAL INFORMATION:   Selected notes from the [facility]: Current Outpatient Medications (Ophthalmic Drugs)  Medication Sig   ketorolac (ACULAR) 0.5 % ophthalmic solution Place 1 drop into the right eye 3 (three) times daily.   brimonidine (ALPHAGAN) 0.15 % ophthalmic solution Place 1 drop into the right eye every 8 (eight) hours.   loteprednol (LOTEMAX) 0.5 % ophthalmic suspension Place 1 drop into the right eye daily.   Polyethyl Glycol-Propyl Glycol (SYSTANE OP) Apply 1 drop to eye every morning. (Patient not taking: Reported on 10/22/2021)   Propylene Glycol 0.6 % SOLN Apply 1 drop to eye in the morning.   No current facility-administered medications for this visit. (Ophthalmic Drugs)   Current Outpatient Medications (Other)  Medication Sig   albuterol (VENTOLIN HFA) 108 (90 Base) MCG/ACT inhaler Inhale 2 puffs into the lungs every 4 (four) hours as needed for wheezing or shortness of breath.   ALPRAZolam (XANAX) 0.25 MG tablet Take 0.25 mg by mouth at bedtime as needed for sleep.    Ascorbic Acid (VITAMIN C PO) Take 1 tablet by mouth every morning. Not  sure the dosage   azelastine (ASTELIN) 0.1 % nasal spray Place 1 spray into both nostrils 2 (two) times daily.   Benralizumab (FASENRA [place]) Inject into the skin every 8 (eight) weeks.   budesonide-formoterol (SYMBICORT) 160-4.5 MCG/ACT inhaler Inhale 2 puffs into the lungs in the morning and at bedtime.   Calcium Carbonate-Vitamin D (CALCIUM-VITAMIN D) 600-125 MG-UNIT TABS Take 1 tablet by mouth 2 (two) times daily.    diltiazem (CARDIZEM CD) 180 MG 24 hr capsule TAKE 1 CAPSULE EVERY       MORNING AND AT BEDTIME   ELIQUIS 5 MG TABS tablet TAKE 1 TABLET TWICE A DAY   EPINEPHrine 0.3 mg/0.3 mL IJ SOAJ injection SMARTSIG:0.3 Milligram(s) IM Once   esomeprazole (NEXIUM) 40 MG capsule Take 1 capsule (40 mg total) by mouth 2 (two) times daily.   Ferrous Sulfate (SLOW FE PO) Take 1 tablet by mouth every morning.   fluorouracil (EFUDEX) 5 % cream SMARTSIG:sparingly Topical Twice Daily   levocetirizine (XYZAL) 5 MG tablet Take 1 tablet (5 mg total) by mouth 2 (two) times daily as needed for allergies (Can take an extra dose during flare ups.).   levothyroxine (SYNTHROID, LEVOTHROID) 75 MCG tablet Take 75 mcg by mouth daily before breakfast.   montelukast (SINGULAIR)  10 MG tablet Take 1 tablet (10 mg total) by mouth at bedtime.   Multiple Vitamin (MULTIVITAMIN WITH MINERALS) TABS tablet Take 1 tablet daily by mouth.   Multiple Vitamins-Minerals (OCUVITE PRESERVISION PO) Take 1 tablet 2 (two) times daily by mouth.   rosuvastatin (CRESTOR) 5 MG tablet Take 1 tablet (5 mg total) by mouth daily.   Current Facility-Administered Medications (Other)  Medication Route   Benralizumab SOSY 30 mg Subcutaneous      REVIEW OF SYSTEMS: ROS   Negative for: Constitutional, Gastrointestinal, Neurological, Skin, Genitourinary, Musculoskeletal, HENT, Endocrine, Cardiovascular, Eyes, Respiratory, Psychiatric, Allergic/Imm, Heme/Lymph Last edited by Silvestre Moment on 10/28/2021  2:13 PM.       ALLERGIES Allergies   Allergen Reactions   Other Other (See Comments)    Latex Gloves   Penicillins Hives    Has patient had a PCN reaction causing immediate rash, facial/tongue/throat swelling, SOB or lightheadedness with hypotension: No Has patient had a PCN reaction causing severe rash involving mucus membranes or skin necrosis: No Has patient had a PCN reaction that required hospitalization: No Has patient had a PCN reaction occurring within the last 10 years: Yes If all of the above answers are "NO", then may proceed with Cephalosporin use.    Sulfa Antibiotics Other (See Comments)    Unknown to patient    Sulfamethoxazole-Trimethoprim Other (See Comments)   Latex Hives and Rash    PAST MEDICAL HISTORY Past Medical History:  Diagnosis Date   Anxiety    Asthma    Asthmatic bronchitis    Atrial flutter ([place])    chads2vasc score is 3   Ehrlichiosis 0932   GERD (gastroesophageal reflux disease)    Granulomatosis with polyangiitis (HCC)    HTN (hypertension)    Hyperlipidemia    Nuclear sclerotic cataract of left eye 06/04/2019   Cataract surgery completed March 2023, Dr. [name]   Paroxysmal atrial fibrillation [facility])    Prostate cancer ([place])    Seasonal allergies    Past Surgical History:  Procedure Laterality Date   BASAL CELL CARCINOMA EXCISION  2013   EYELID   HERNIA REPAIR Bilateral 2000, 2002   KNEE ARTHROSCOPY  2000   prostectomy  1995   RADICAL   TONSILLECTOMY  1940    FAMILY HISTORY Family History  Problem Relation Age of Onset   Asthma Mother 53   Allergies Mother    Ulcers Mother    CVA Father 19   Heart Problems Father    Other Father        NEUROLOGIC ISSUES    Other Brother 62       BICYCLE ACCIDENT   Migraines Son    Diabetes Mellitus I Son     SOCIAL HISTORY Social History   Tobacco Use   Smoking status: Former    Packs/day: 0.50    Years: 4.00    Total pack years: 2.00    Types: Cigarettes    Quit date: 02/28/1961    Years since quitting: 60.7    Smokeless tobacco: Never  Vaping Use   Vaping Use: Never used  Substance Use Topics   Alcohol use: Yes    Alcohol/week: 2.0 - 3.0 standard drinks of alcohol    Types: 2 - 3 Standard drinks or equivalent per week   Drug use: No         OPHTHALMIC EXAM:  Base Eye Exam     Visual Acuity (ETDRS)       Right Left   Dist  [address] 20/70 -1 20/30 -2   Dist ph [place] 20/25 -2         Tonometry (Tonopen, 2:20 PM)       Right Left   Pressure 15 15         Pupils       Pupils APD   Right PERRL None   Left PERRL None         Visual Fields       Left Right    Full Full         Extraocular Movement       Right Left    Full, Ortho Full, Ortho         Neuro/Psych     Oriented x3: Yes   Mood/Affect: Normal         Dilation     Right eye: 2.5% Phenylephrine, 1.0% Mydriacyl @ 2:20 PM           Slit Lamp and Fundus Exam     External Exam       Right Left   External Normal Normal         Slit Lamp Exam       Right Left   Lids/Lashes Normal Normal   Conjunctiva/Sclera White and quiet White and quiet   Cornea Clear Clear   Anterior Chamber Deep, quiet no cells no flare Deep and quiet   Iris Round and reactive Round and reactive   Lens Posterior chamber intraocular lens, open PC slightly decentered superotemporal, no vitreous prolapse around the IOL 3+ Nuclear sclerosis   Anterior Vitreous Normal Normal         Fundus Exam       Right Left   Posterior Vitreous Clear and avitric    Disc Normal    C/D Ratio 0.65    Macula Much less topographic distortion subfoveal nodular material remains    Vessels Normal    Periphery Normal             IMAGING AND PROCEDURES  Imaging and Procedures for 10/28/21  OCT, Retina - OU - Both Eyes       Right Eye Quality was good. Scan locations included subfoveal. Central Foveal Thickness: 573. Progression has worsened. Findings include retinal drusen , cystoid macular edema, epiretinal membrane.    Left Eye Quality was good. Scan locations included subfoveal. Central Foveal Thickness: 359. Progression has been stable. Findings include abnormal foveal contour, retinal drusen .   Notes OD, slightly worse CME now, yet still active, with subfoveal large drusenoid deposit likely secondary from the overlying epiretinal membrane, now with improvement of drusenoid material from 220 m now down to 155 m in vertical height               ASSESSMENT/PLAN:  Chronically dry eyes, left Continue on artificial tears.  Redness in the morning of the eye likely related to latanoprost use.  Cystoid macular edema of right eye CME slightly worse now while also on continue latanoprost use we will discontinue latanoprost because of the possible mild uveitogenic and CME triggering responses and pseudophakic eyes.  Intraocular pressures are well controlled.  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  We will taper the use of latanoprost because of the possible secondary CME side effect also discontinue dorzolamide  Right  epiretinal membrane Doing very well ODU after vitrectomy membrane peel as a subfoveal nodular change at the level of the RPE continues to diminish in size from preoperative height and thickness of 245 m now down to 155 m     ICD-10-CM   1. Glaucomatocyclitic crisis of right eye, moderate stage  H40.41X2 OCT, Retina - OU - Both Eyes    2. Chronically dry eyes, left  H04.122     3. Cystoid macular edema of right eye  H35.351     4. Right epiretinal membrane  H35.371       1.  OD anatomically the macular region is improving rather nicely with the subfoveal large drusenoid deposit diminishing in size and extent post vitrectomy membrane peel.  The CME has slightly worsened.  Based upon  this we will decrease and in fact stop latanoprost and restart topical ketorolac to the right eye  2.  No signs of recurrence of Pozner Schlossman syndrome  3.  CME continues.  We will continue to manage this and the pressure  Ophthalmic Meds Ordered this visit:  Meds ordered this encounter  Medications   ketorolac (ACULAR) 0.5 % ophthalmic solution    Sig: Place 1 drop into the right eye 3 (three) times daily.    Dispense:  54 mL    Refill:  0       Return in about 8 weeks (around 12/23/2021) for dilate, OD, OCT.  Patient Instructions  Brimonidine (purple top) 1 drop right eye twice daily  Ketorolac (gray top) 1 drop right eye 3 times daily  Lotemax (Pink top) 1 drop every other day right eye.    Discontinue latanoprost and decrease orange top, dorzolamide if not already done   Explained the diagnoses, plan, and follow up with the patient and they expressed understanding.  Patient expressed understanding of the importance of proper follow up care.   Clent Demark Annaleah Arata M.D. Diseases & Surgery of the Retina and Vitreous [facility] 10/28/21     Abbreviations: M myopia (nearsighted); A astigmatism; H hyperopia (farsighted); P presbyopia; Mrx spectacle prescription;  CTL contact lenses; OD right eye; OS left eye; OU both eyes  XT exotropia; ET esotropia; PEK punctate epithelial keratitis; PEE punctate epithelial erosions; DES dry eye syndrome; MGD meibomian gland dysfunction; ATs artificial tears; PFAT's preservative free artificial tears; [place] nuclear sclerotic cataract; PSC posterior subcapsular cataract; ERM epi-retinal membrane; PVD posterior vitreous detachment; RD retinal detachment; DM diabetes mellitus; DR diabetic retinopathy; NPDR non-proliferative diabetic retinopathy; PDR proliferative diabetic retinopathy; CSME clinically significant macular edema; DME diabetic macular edema; dbh dot blot hemorrhages; CWS cotton wool spot; POAG primary open angle  glaucoma; C/D cup-to-disc ratio; HVF humphrey visual field; GVF goldmann visual field; OCT optical coherence tomography; IOP intraocular pressure; BRVO Branch retinal vein occlusion; CRVO central retinal vein occlusion; CRAO central retinal artery occlusion; BRAO branch retinal artery occlusion; RT retinal tear; SB scleral buckle; PPV pars plana vitrectomy; VH Vitreous hemorrhage; PRP panretinal laser photocoagulation; IVK intravitreal kenalog; VMT vitreomacular traction; MH Macular hole;  NVD neovascularization of the disc; NVE neovascularization elsewhere; AREDS age related eye disease study; ARMD age related macular degeneration; POAG primary open angle glaucoma; EBMD epithelial/anterior basement membrane dystrophy; ACIOL anterior chamber intraocular lens; IOL intraocular lens; PCIOL posterior chamber intraocular lens; Phaco/IOL phacoemulsification with intraocular lens placement; [place] photorefractive keratectomy; LASIK laser assisted in situ keratomileusis; HTN hypertension; DM diabetes mellitus; COPD chronic obstructive pulmonary disease

## 2021-11-03 IMAGING — DX DG CHEST 2V
2 series · 2 of 2 positions shown · non-contrast
Comparison: 09/04/2019

CLINICAL DATA: Severe cough.  Asthma.

EXAM:
CHEST - 2 VIEW

[chest pa]
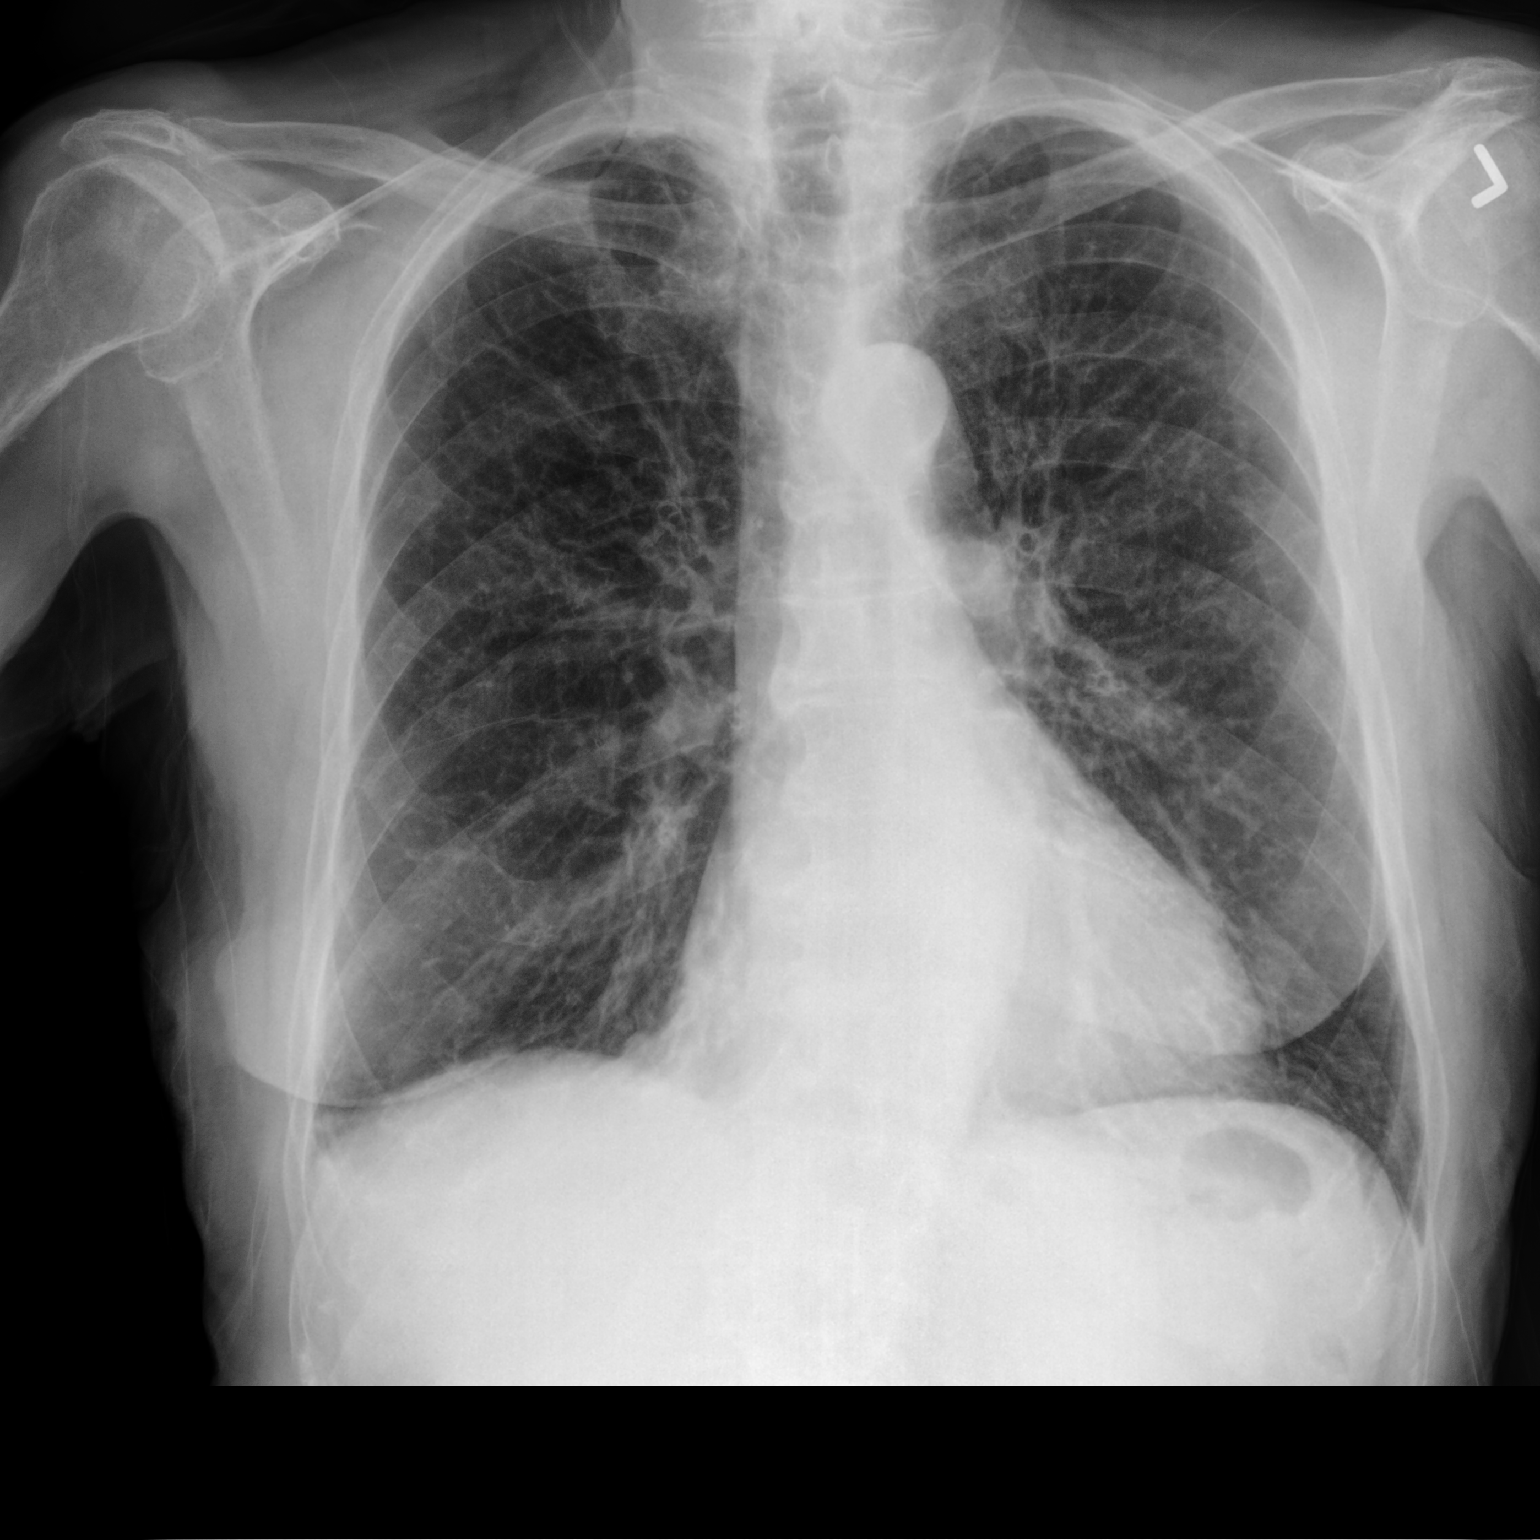

[chest lat]
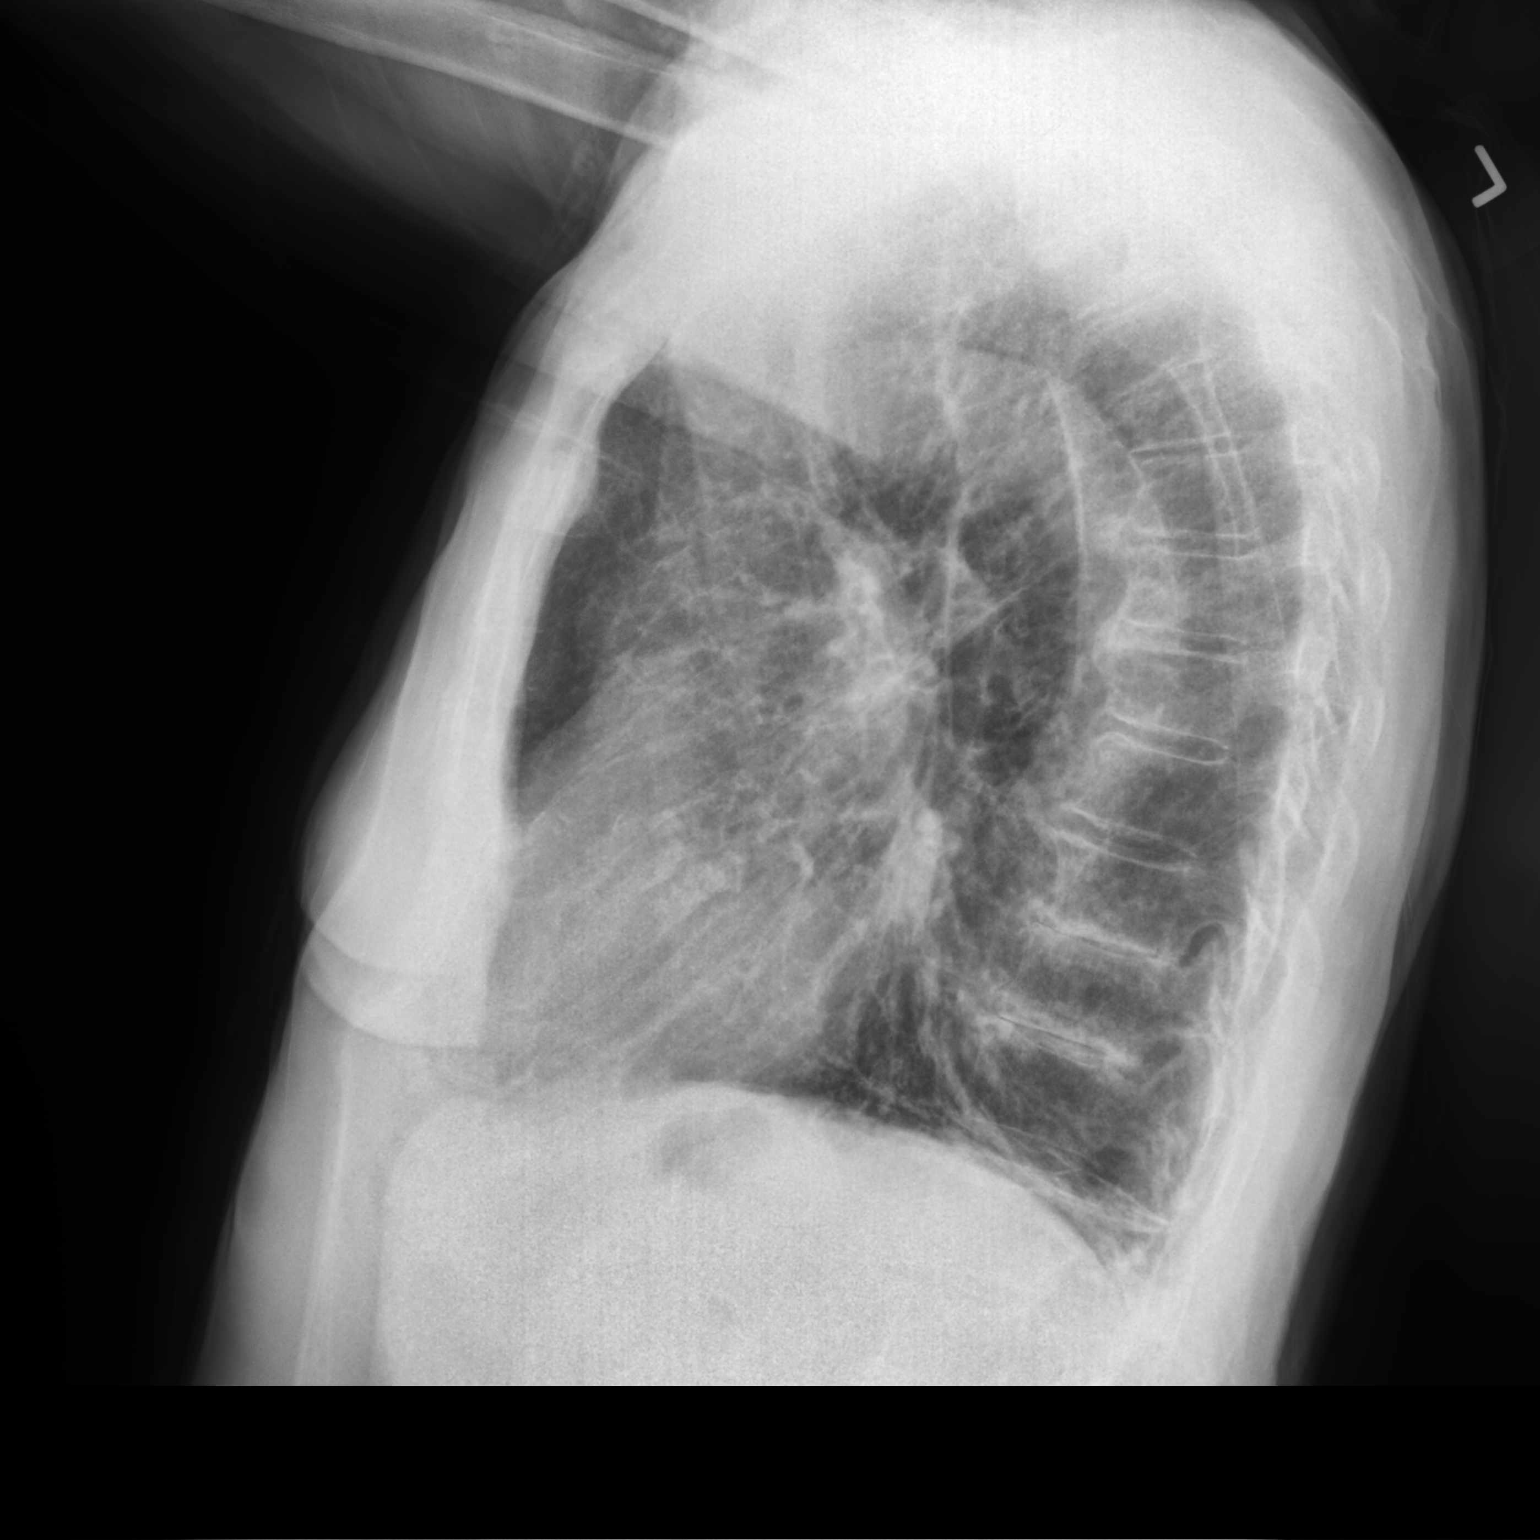

[2 of 2 positions shown; findings below may reference images not displayed]

FINDINGS: Heart size remains at the upper limits of normal. Aortic
atherosclerotic calcification noted. Pulmonary hyperinflation and
pulmonary interstitial prominence are stable and consistent with
COPD. Right lower lobe scarring shows no significant change. No
evidence of acute infiltrate or edema. No evidence of pleural
effusion.
IMPRESSION: Stable COPD and right lower lobe scarring. No active lung disease.

## 2021-11-08 ENCOUNTER — Encounter (INDEPENDENT_AMBULATORY_CARE_PROVIDER_SITE_OTHER): Payer: Self-pay | Admitting: Ophthalmology

## 2021-11-08 ENCOUNTER — Ambulatory Visit (INDEPENDENT_AMBULATORY_CARE_PROVIDER_SITE_OTHER): Payer: Medicare Other | Admitting: Ophthalmology

## 2021-11-08 DIAGNOSIS — H4051X3 Glaucoma secondary to other eye disorders, right eye, severe stage: Secondary | ICD-10-CM | POA: Diagnosis not present

## 2021-11-08 DIAGNOSIS — H01003 Unspecified blepharitis right eye, unspecified eyelid: Secondary | ICD-10-CM

## 2021-11-08 DIAGNOSIS — H4041X2 Glaucoma secondary to eye inflammation, right eye, moderate stage: Secondary | ICD-10-CM | POA: Diagnosis not present

## 2021-11-08 DIAGNOSIS — L719 Rosacea, unspecified: Secondary | ICD-10-CM

## 2021-11-08 DIAGNOSIS — L251 Unspecified contact dermatitis due to drugs in contact with skin: Secondary | ICD-10-CM | POA: Insufficient documentation

## 2021-11-08 DIAGNOSIS — H02883 Meibomian gland dysfunction of right eye, unspecified eyelid: Secondary | ICD-10-CM

## 2021-11-08 DIAGNOSIS — L233 Allergic contact dermatitis due to drugs in contact with skin: Secondary | ICD-10-CM

## 2021-11-08 DIAGNOSIS — H02886 Meibomian gland dysfunction of left eye, unspecified eyelid: Secondary | ICD-10-CM | POA: Diagnosis not present

## 2021-11-08 MED ORDER — BACITRACIN-POLYMYXIN B 500-10000 UNIT/GM OP OINT: TOPICAL_OINTMENT | Freq: Two times a day (BID) | OPHTHALMIC | 0 refills | Status: AC

## 2021-11-08 NOTE — Assessment & Plan Note (Signed)
OD condition has not recurred.  The patient has been using Alphagan since the glaucomatous cyclic crisis and likely has developed a surface allergy as he has had itching and also has erythema and induration of the skin below the right eye.  15% chance of allergy with Alphagan topical use.  We will discontinue Alphagan now.

## 2021-11-08 NOTE — Patient Instructions (Signed)
Patient likely allergic to Alphagan (purple top) we will discontinue this immediately  Patient will use Lotemax once daily to the right eye.  Must start topical ophthalmic ointment to the right eye twice daily to treat the bacterial component of his "eyelid disease" as a beginning to long-term management of meibomian gland dysfunction  Patient to return to his anterior segment surgeon and/or ophthalmologist

## 2021-11-08 NOTE — Progress Notes (Signed)
11/08/2021     CHIEF COMPLAINT Patient presents for  Chief Complaint  Patient presents with   Eye Problem   Retina Evaluation      HISTORY OF PRESENT ILLNESS: Chad Morrison is a 88 y.o. male who presents to the clinic today for:   HPI   WIP- RED AND SWOLLEN EYELIDS (Stopped opthalmic drops on Saturday) Pt stated vision is stable. Pt c/o itchy eyelids since Saturday. Pt stated, "Ever since last Thursday, my eye has been red and when we would put in the drops, it would be worse. There would be a sharp pain and a burning sensation. We stopped the drops on Saturday and noticed that my right eye would just feel itchy. Today, I feel the pain. More like a dull ache." Pt has allergy to Sulfa Antibiotics.   Last edited by Silvestre Moment on 11/08/2021  3:49 PM.      Referring physician: Haywood Pao, MD [place],  [place] [address]  HISTORICAL INFORMATION:   Selected notes from the MEDICAL RECORD NUMBER       CURRENT MEDICATIONS: Current Outpatient Medications (Ophthalmic Drugs)  Medication Sig   bacitracin-polymyxin b (POLYSPORIN) ophthalmic ointment Place into the right eye 2 (two) times daily for 10 days. Place a 1/2 inch ribbon of ointment into the lower eyelid.   brimonidine (ALPHAGAN) 0.15 % ophthalmic solution Place 1 drop into the right eye every 8 (eight) hours.   ketorolac (ACULAR) 0.5 % ophthalmic solution Place 1 drop into the right eye 3 (three) times daily.   loteprednol (LOTEMAX) 0.5 % ophthalmic suspension Place 1 drop into the right eye daily.   Polyethyl Glycol-Propyl Glycol (SYSTANE OP) Apply 1 drop to eye every morning. (Patient not taking: Reported on 10/22/2021)   Propylene Glycol 0.6 % SOLN Apply 1 drop to eye in the morning.   No current facility-administered medications for this visit. (Ophthalmic Drugs)   Current Outpatient Medications (Other)  Medication Sig   albuterol (VENTOLIN HFA) 108 (90 Base) MCG/ACT inhaler Inhale 2 puffs into the  lungs every 4 (four) hours as needed for wheezing or shortness of breath.   ALPRAZolam (XANAX) 0.25 MG tablet Take 0.25 mg by mouth at bedtime as needed for sleep.    Ascorbic Acid (VITAMIN C PO) Take 1 tablet by mouth every morning. Not sure the dosage   azelastine (ASTELIN) 0.1 % nasal spray Place 1 spray into both nostrils 2 (two) times daily.   Benralizumab (FASENRA [place]) Inject into the skin every 8 (eight) weeks.   budesonide-formoterol (SYMBICORT) 160-4.5 MCG/ACT inhaler Inhale 2 puffs into the lungs in the morning and at bedtime.   Calcium Carbonate-Vitamin D (CALCIUM-VITAMIN D) 600-125 MG-UNIT TABS Take 1 tablet by mouth 2 (two) times daily.    diltiazem (CARDIZEM CD) 180 MG 24 hr capsule TAKE 1 CAPSULE EVERY       MORNING AND AT BEDTIME   ELIQUIS 5 MG TABS tablet TAKE 1 TABLET TWICE A DAY   EPINEPHrine 0.3 mg/0.3 mL IJ SOAJ injection SMARTSIG:0.3 Milligram(s) IM Once   esomeprazole (NEXIUM) 40 MG capsule Take 1 capsule (40 mg total) by mouth 2 (two) times daily.   Ferrous Sulfate (SLOW FE PO) Take 1 tablet by mouth every morning.   fluorouracil (EFUDEX) 5 % cream SMARTSIG:sparingly Topical Twice Daily   levocetirizine (XYZAL) 5 MG tablet Take 1 tablet (5 mg total) by mouth 2 (two) times daily as needed for allergies (Can take an extra dose during flare ups.).  levothyroxine (SYNTHROID, LEVOTHROID) 75 MCG tablet Take 75 mcg by mouth daily before breakfast.   montelukast (SINGULAIR) 10 MG tablet Take 1 tablet (10 mg total) by mouth at bedtime.   Multiple Vitamin (MULTIVITAMIN WITH MINERALS) TABS tablet Take 1 tablet daily by mouth.   Multiple Vitamins-Minerals (OCUVITE PRESERVISION PO) Take 1 tablet 2 (two) times daily by mouth.   rosuvastatin (CRESTOR) 5 MG tablet Take 1 tablet (5 mg total) by mouth daily.   Current Facility-Administered Medications (Other)  Medication Route   Benralizumab SOSY 30 mg Subcutaneous      REVIEW OF SYSTEMS: ROS   Negative for: Constitutional,  Gastrointestinal, Neurological, Skin, Genitourinary, Musculoskeletal, HENT, Endocrine, Cardiovascular, Eyes, Respiratory, Psychiatric, Allergic/Imm, Heme/Lymph Last edited by Silvestre Moment on 11/08/2021  3:48 PM.       ALLERGIES Allergies  Allergen Reactions   Other Other (See Comments)    Latex Gloves   Penicillins Hives    Has patient had a PCN reaction causing immediate rash, facial/tongue/throat swelling, SOB or lightheadedness with hypotension: No Has patient had a PCN reaction causing severe rash involving mucus membranes or skin necrosis: No Has patient had a PCN reaction that required hospitalization: No Has patient had a PCN reaction occurring within the last 10 years: Yes If all of the above answers are "NO", then may proceed with Cephalosporin use.    Sulfa Antibiotics Other (See Comments)    Unknown to patient    Sulfamethoxazole-Trimethoprim Other (See Comments)   Latex Hives and Rash    PAST MEDICAL HISTORY Past Medical History:  Diagnosis Date   Anxiety    Asthma    Asthmatic bronchitis    Atrial flutter ([place])    chads2vasc score is 3   Ehrlichiosis 6283   GERD (gastroesophageal reflux disease)    Granulomatosis with polyangiitis (HCC)    HTN (hypertension)    Hyperlipidemia    Nuclear sclerotic cataract of left eye 06/04/2019   Cataract surgery completed March 2023, Dr. [name]   Paroxysmal atrial fibrillation [facility])    Prostate cancer ([place])    Seasonal allergies    Past Surgical History:  Procedure Laterality Date   BASAL CELL CARCINOMA EXCISION  2013   EYELID   HERNIA REPAIR Bilateral 2000, 2002   KNEE ARTHROSCOPY  2000   prostectomy  1995   RADICAL   TONSILLECTOMY  1940    FAMILY HISTORY Family History  Problem Relation Age of Onset   Asthma Mother 41   Allergies Mother    Ulcers Mother    CVA Father 91   Heart Problems Father    Other Father        NEUROLOGIC ISSUES    Other Brother 34       BICYCLE ACCIDENT   Migraines Son    Diabetes  Mellitus I Son     SOCIAL HISTORY Social History   Tobacco Use   Smoking status: Former    Packs/day: 0.50    Years: 4.00    Total pack years: 2.00    Types: Cigarettes    Quit date: 02/28/1961    Years since quitting: 60.7   Smokeless tobacco: Never  Vaping Use   Vaping Use: Never used  Substance Use Topics   Alcohol use: Yes    Alcohol/week: 2.0 - 3.0 standard drinks of alcohol    Types: 2 - 3 Standard drinks or equivalent per week   Drug use: No         OPHTHALMIC EXAM:  Base Eye Exam  Visual Acuity (ETDRS)       Right Left   Dist [place] 20/100 -2 20/25 -2   Dist ph [place] 20/80 -2          Tonometry (Tonopen, 3:55 PM)       Right Left   Pressure 15 13         Pupils       Pupils APD   Right PERRL None   Left PERRL None         Visual Fields       Left Right    Full          Extraocular Movement       Right Left    Full, Ortho Full, Ortho         Neuro/Psych     Oriented x3: Yes   Mood/Affect: Normal         Dilation     Right eye: 2.5% Phenylephrine, 1.0% Mydriacyl @ 3:55 PM           Slit Lamp and Fundus Exam     External Exam       Right Left   External Edema, lower lid with ecchymosis Normal         Slit Lamp Exam       Right Left   Lids/Lashes 1+ Meibomian gland dysfunction, Ecchymosis, 1+ Blepharitis Normal   Conjunctiva/Sclera White and quiet White and quiet   Cornea Clear Clear   Anterior Chamber Deep, quiet no cells no flare Deep and quiet   Iris Round and reactive Round and reactive   Lens Posterior chamber intraocular lens, open PC slightly decentered superotemporal, no vitreous prolapse around the IOL 3+ Nuclear sclerosis   Anterior Vitreous Normal Normal         Fundus Exam       Right Left   Posterior Vitreous Clear and avitric    Disc Normal    C/D Ratio 0.65    Macula Much less topographic distortion subfoveal nodular material remains    Vessels Normal    Periphery Normal              IMAGING AND PROCEDURES  Imaging and Procedures for 11/15/21  Color Fundus Photography Optos - OU - Both Eyes       Right Eye Progression has been stable. Disc findings include normal observations. Macula : drusen. Vessels : normal observations. Periphery : normal observations.   Left Eye Progression has been stable. Disc findings include normal observations. Macula : drusen. Vessels : normal observations. Periphery : normal observations.   Notes Clear view             ASSESSMENT/PLAN:  Secondary glaucoma due to combination mechanisms, right, severe stage OD condition has not recurred.  The patient has been using Alphagan since the glaucomatous cyclic crisis and likely has developed a surface allergy as he has had itching and also has erythema and induration of the skin below the right eye.  15% chance of allergy with Alphagan topical use.  We will discontinue Alphagan now.  Contact dermatitis due to drugs in contact with skin Most likely from Alphagan use.  Currently patient using ketorolac, Lotemax, and Alphagan.  Findings most consistent with a contact dermatitis in the setting however with 3+ meibomian gland dysfunction likely rosacea blepharitis.  I will discontinue the off-again as intraocular pressures remain normal and resume only Lotemax 1 drop right eye daily to the right eye for the next 5 days.  If no signs of worsening of symptoms, may need and may be able to use ketorolac for posterior segment, CME control.  Do recommend follow-up with anterior segment ophthalmology for control of his severe meibomian gland dysfunction ocular surface disease.  Meibomian gland dysfunction (MGD) of both eyes Likely contributory to the crusting the patient is symptomatic with, and some role of superimposed rosacea blepharitis  Blepharitis of right eye with rosacea Will need anterior segment evaluation and construction of program to treat his meibomian gland dysfunction      ICD-10-CM   1. Glaucomatocyclitic crisis of right eye, moderate stage  H40.41X2 Color Fundus Photography Optos - OU - Both Eyes    2. Secondary glaucoma due to combination mechanisms, right, severe stage  H40.51X3     3. Allergic contact dermatitis due to drugs in contact with skin  L23.3     4. Meibomian gland dysfunction (MGD) of both eyes  H02.883    H02.886     5. Blepharitis of right eye with rosacea  H01.003    L71.9       1.  Discontinue Alphagan as well as ketorolac at this time.  Will likely resume a ketorolac twice daily in the coming 5 days if no worsening of symptoms on Lotemax once daily.  2.  Patient to commence therapy with topical bacitracin which is not available on formulary so thus we will use Polysporin (do not substitute Neosporin) so as to treat the bacterial component of his eyelid disease until further long-term management can be undertaken with anterior segment ophthalmologist  3.  Ophthalmic Meds Ordered this visit:  Meds ordered this encounter  Medications   bacitracin-polymyxin b (POLYSPORIN) ophthalmic ointment    Sig: Place into the right eye 2 (two) times daily for 10 days. Place a 1/2 inch ribbon of ointment into the lower eyelid.    Dispense:  3.5 g    Refill:  0       Return in about 2 weeks (around 11/22/2021) for NO DILATE.  Patient Instructions  Patient likely allergic to Alphagan (purple top) we will discontinue this immediately  Patient will use Lotemax once daily to the right eye.  Must start topical ophthalmic ointment to the right eye twice daily to treat the bacterial component of his "eyelid disease" as a beginning to long-term management of meibomian gland dysfunction  Patient to return to his anterior segment surgeon and/or ophthalmologist   Explained the diagnoses, plan, and follow up with the patient and they expressed understanding.  Patient expressed understanding of the importance of proper follow up care.   Chad Morrison  Chad Morrison M.D. Diseases & Surgery of the Retina and Vitreous Retina & Diabetic [place] 11/15/21     Abbreviations: M myopia (nearsighted); A astigmatism; H hyperopia (farsighted); P presbyopia; Mrx spectacle prescription;  CTL contact lenses; OD right eye; OS left eye; OU both eyes  XT exotropia; ET esotropia; PEK punctate epithelial keratitis; PEE punctate epithelial erosions; DES dry eye syndrome; MGD meibomian gland dysfunction; ATs artificial tears; PFAT's preservative free artificial tears; [place] nuclear sclerotic cataract; PSC posterior subcapsular cataract; ERM epi-retinal membrane; PVD posterior vitreous detachment; RD retinal detachment; DM diabetes mellitus; DR diabetic retinopathy; NPDR non-proliferative diabetic retinopathy; PDR proliferative diabetic retinopathy; CSME clinically significant macular edema; DME diabetic macular edema; dbh dot blot hemorrhages; CWS cotton wool spot; POAG primary open angle glaucoma; C/D cup-to-disc ratio; HVF humphrey visual field; GVF goldmann visual field; OCT optical coherence tomography; IOP intraocular pressure; BRVO Branch retinal vein  occlusion; CRVO central retinal vein occlusion; CRAO central retinal artery occlusion; BRAO branch retinal artery occlusion; RT retinal tear; SB scleral buckle; PPV pars plana vitrectomy; VH Vitreous hemorrhage; PRP panretinal laser photocoagulation; IVK intravitreal kenalog; VMT vitreomacular traction; MH Macular hole;  NVD neovascularization of the disc; NVE neovascularization elsewhere; AREDS age related eye disease study; ARMD age related macular degeneration; POAG primary open angle glaucoma; EBMD epithelial/anterior basement membrane dystrophy; ACIOL anterior chamber intraocular lens; IOL intraocular lens; PCIOL posterior chamber intraocular lens; Phaco/IOL phacoemulsification with intraocular lens placement; [place] photorefractive keratectomy; LASIK laser assisted in situ keratomileusis; HTN hypertension; DM diabetes  mellitus; COPD chronic obstructive pulmonary disease

## 2021-11-08 NOTE — Assessment & Plan Note (Signed)
Will need anterior segment evaluation and construction of program to treat his meibomian gland dysfunction

## 2021-11-08 NOTE — Assessment & Plan Note (Signed)
Likely contributory to the crusting the patient is symptomatic with, and some role of superimposed rosacea blepharitis

## 2021-11-08 NOTE — Assessment & Plan Note (Signed)
Most likely from Alphagan use.  Currently patient using ketorolac, Lotemax, and Alphagan.  Findings most consistent with a contact dermatitis in the setting however with 3+ meibomian gland dysfunction likely rosacea blepharitis.  I will discontinue the off-again as intraocular pressures remain normal and resume only Lotemax 1 drop right eye daily to the right eye for the next 5 days.  If no signs of worsening of symptoms, may need and may be able to use ketorolac for posterior segment, CME control.  Do recommend follow-up with anterior segment ophthalmology for control of his severe meibomian gland dysfunction ocular surface disease.

## 2021-11-09 DIAGNOSIS — H0012 Chalazion right lower eyelid: Secondary | ICD-10-CM | POA: Diagnosis not present

## 2021-11-09 DIAGNOSIS — H0011 Chalazion right upper eyelid: Secondary | ICD-10-CM | POA: Diagnosis not present

## 2021-11-22 ENCOUNTER — Encounter (INDEPENDENT_AMBULATORY_CARE_PROVIDER_SITE_OTHER): Payer: Self-pay | Admitting: Ophthalmology

## 2021-11-22 ENCOUNTER — Ambulatory Visit (INDEPENDENT_AMBULATORY_CARE_PROVIDER_SITE_OTHER): Payer: Medicare Other | Admitting: Ophthalmology

## 2021-11-22 DIAGNOSIS — H35351 Cystoid macular degeneration, right eye: Secondary | ICD-10-CM

## 2021-11-22 DIAGNOSIS — H4041X2 Glaucoma secondary to eye inflammation, right eye, moderate stage: Secondary | ICD-10-CM

## 2021-11-22 DIAGNOSIS — H353132 Nonexudative age-related macular degeneration, bilateral, intermediate dry stage: Secondary | ICD-10-CM

## 2021-11-22 DIAGNOSIS — L233 Allergic contact dermatitis due to drugs in contact with skin: Secondary | ICD-10-CM | POA: Diagnosis not present

## 2021-11-22 NOTE — Assessment & Plan Note (Signed)
OD improved after discontinuation of Alphagan

## 2021-11-22 NOTE — Progress Notes (Signed)
11/22/2021     CHIEF COMPLAINT Patient presents for  Chief Complaint  Patient presents with   Retina Evaluation      HISTORY OF PRESENT ILLNESS: Chad Morrison is a 88 y.o. male who presents to the clinic today for:   HPI   Glaucomatocyclitic crisis of right eye, moderate stage 2 weeks no dilate Pt states his vision has been stable Pt denies any new floaters or FOL  Pt states he still has a cloudy spot in his left upper eye Last edited by Morene Rankins, CMA on 11/22/2021  1:11 PM.      Referring physician: Jola Schmidt, MD [place],  [place] [id number]  HISTORICAL INFORMATION:   Selected notes from the MEDICAL RECORD NUMBER       CURRENT MEDICATIONS: Current Outpatient Medications (Ophthalmic Drugs)  Medication Sig   brimonidine (ALPHAGAN) 0.15 % ophthalmic solution Place 1 drop into the right eye every 8 (eight) hours.   ketorolac (ACULAR) 0.5 % ophthalmic solution Place 1 drop into the right eye 3 (three) times daily.   loteprednol (LOTEMAX) 0.5 % ophthalmic suspension Place 1 drop into the right eye daily.   Polyethyl Glycol-Propyl Glycol (SYSTANE OP) Apply 1 drop to eye every morning. (Patient not taking: Reported on 10/22/2021)   Propylene Glycol 0.6 % SOLN Apply 1 drop to eye in the morning.   No current facility-administered medications for this visit. (Ophthalmic Drugs)   Current Outpatient Medications (Other)  Medication Sig   albuterol (VENTOLIN HFA) 108 (90 Base) MCG/ACT inhaler Inhale 2 puffs into the lungs every 4 (four) hours as needed for wheezing or shortness of breath.   ALPRAZolam (XANAX) 0.25 MG tablet Take 0.25 mg by mouth at bedtime as needed for sleep.    Ascorbic Acid (VITAMIN C PO) Take 1 tablet by mouth every morning. Not sure the dosage   azelastine (ASTELIN) 0.1 % nasal spray Place 1 spray into both nostrils 2 (two) times daily.   Benralizumab (FASENRA [place]) Inject into the skin every 8 (eight) weeks.   budesonide-formoterol  (SYMBICORT) 160-4.5 MCG/ACT inhaler Inhale 2 puffs into the lungs in the morning and at bedtime.   Calcium Carbonate-Vitamin D (CALCIUM-VITAMIN D) 600-125 MG-UNIT TABS Take 1 tablet by mouth 2 (two) times daily.    diltiazem (CARDIZEM CD) 180 MG 24 hr capsule TAKE 1 CAPSULE EVERY       MORNING AND AT BEDTIME   ELIQUIS 5 MG TABS tablet TAKE 1 TABLET TWICE A DAY   EPINEPHrine 0.3 mg/0.3 mL IJ SOAJ injection SMARTSIG:0.3 Milligram(s) IM Once   esomeprazole (NEXIUM) 40 MG capsule Take 1 capsule (40 mg total) by mouth 2 (two) times daily.   Ferrous Sulfate (SLOW FE PO) Take 1 tablet by mouth every morning.   fluorouracil (EFUDEX) 5 % cream SMARTSIG:sparingly Topical Twice Daily   levocetirizine (XYZAL) 5 MG tablet Take 1 tablet (5 mg total) by mouth 2 (two) times daily as needed for allergies (Can take an extra dose during flare ups.).   levothyroxine (SYNTHROID, LEVOTHROID) 75 MCG tablet Take 75 mcg by mouth daily before breakfast.   montelukast (SINGULAIR) 10 MG tablet Take 1 tablet (10 mg total) by mouth at bedtime.   Multiple Vitamin (MULTIVITAMIN WITH MINERALS) TABS tablet Take 1 tablet daily by mouth.   Multiple Vitamins-Minerals (OCUVITE PRESERVISION PO) Take 1 tablet 2 (two) times daily by mouth.   rosuvastatin (CRESTOR) 5 MG tablet Take 1 tablet (5 mg total) by mouth daily.  Current Facility-Administered Medications (Other)  Medication Route   Benralizumab SOSY 30 mg Subcutaneous      REVIEW OF SYSTEMS: ROS   Negative for: Constitutional, Gastrointestinal, Neurological, Skin, Genitourinary, Musculoskeletal, HENT, Endocrine, Cardiovascular, Eyes, Respiratory, Psychiatric, Allergic/Imm, Heme/Lymph Last edited by Hurman Horn, MD on 11/22/2021  1:40 PM.       ALLERGIES Allergies  Allergen Reactions   Other Other (See Comments)    Latex Gloves   Penicillins Hives    Has patient had a PCN reaction causing immediate rash, facial/tongue/throat swelling, SOB or lightheadedness with  hypotension: No Has patient had a PCN reaction causing severe rash involving mucus membranes or skin necrosis: No Has patient had a PCN reaction that required hospitalization: No Has patient had a PCN reaction occurring within the last 10 years: Yes If all of the above answers are "NO", then may proceed with Cephalosporin use.    Sulfa Antibiotics Other (See Comments)    Unknown to patient    Sulfamethoxazole-Trimethoprim Other (See Comments)   Latex Hives and Rash    PAST MEDICAL HISTORY Past Medical History:  Diagnosis Date   Anxiety    Asthma    Asthmatic bronchitis    Atrial flutter ([place])    chads2vasc score is 3   Ehrlichiosis 5102   GERD (gastroesophageal reflux disease)    Granulomatosis with polyangiitis (HCC)    HTN (hypertension)    Hyperlipidemia    Nuclear sclerotic cataract of left eye 06/04/2019   Cataract surgery completed March 2023, Dr. [name]   Paroxysmal atrial fibrillation [facility])    Prostate cancer ([place])    Seasonal allergies    Past Surgical History:  Procedure Laterality Date   BASAL CELL CARCINOMA EXCISION  2013   EYELID   HERNIA REPAIR Bilateral 2000, 2002   KNEE ARTHROSCOPY  2000   prostectomy  1995   RADICAL   TONSILLECTOMY  1940    FAMILY HISTORY Family History  Problem Relation Age of Onset   Asthma Mother 43   Allergies Mother    Ulcers Mother    CVA Father 33   Heart Problems Father    Other Father        NEUROLOGIC ISSUES    Other Brother 41       BICYCLE ACCIDENT   Migraines Son    Diabetes Mellitus I Son     SOCIAL HISTORY Social History   Tobacco Use   Smoking status: Former    Packs/day: 0.50    Years: 4.00    Total pack years: 2.00    Types: Cigarettes    Quit date: 02/28/1961    Years since quitting: 60.7   Smokeless tobacco: Never  Vaping Use   Vaping Use: Never used  Substance Use Topics   Alcohol use: Yes    Alcohol/week: 2.0 - 3.0 standard drinks of alcohol    Types: 2 - 3 Standard drinks or equivalent per  week   Drug use: No         OPHTHALMIC EXAM:  Base Eye Exam     Visual Acuity (ETDRS)       Right Left   Dist [place] 20/100 -1 20/25 -1         Tonometry (Tonopen, 1:18 PM)       Right Left   Pressure 10 8         Pupils       Pupils   Right PERRL   Left PERRL  Extraocular Movement       Right Left    Ortho Ortho    -- -- --  --  --  -- -- --   -- -- --  --  --  -- -- --           Neuro/Psych     Oriented x3: Yes   Mood/Affect: Normal           Slit Lamp and Fundus Exam     External Exam       Right Left   External Erythema edema and induration of the lower lids have now subsided off of Alphagan Normal         Slit Lamp Exam       Right Left   Lids/Lashes 1+ Meibomian gland dysfunction, Ecchymosis, 1+ Blepharitis Normal   Conjunctiva/Sclera White and quiet White and quiet   Cornea Clear Clear   Anterior Chamber Deep, quiet no cells no flare Deep and quiet   Iris Round and reactive Round and reactive   Lens Posterior chamber intraocular lens, open PC slightly decentered superotemporal, no vitreous prolapse around the IOL Centered posterior chamber intraocular lens   Anterior Vitreous Normal Normal            IMAGING AND PROCEDURES  Imaging and Procedures for 11/22/21  OCT, Retina - OU - Both Eyes       Right Eye Quality was good. Scan locations included subfoveal. Central Foveal Thickness: 599. Progression has been stable. Findings include retinal drusen , cystoid macular edema, epiretinal membrane.   Left Eye Quality was good. Scan locations included subfoveal. Central Foveal Thickness: 359. Progression has been stable. Findings include abnormal foveal contour, retinal drusen .   Notes OD, slightly worse CME now, yet still active, with subfoveal large drusenoid deposit likely secondary from the overlying epiretinal membrane, now with improvement of drusenoid material from 249 m now down to 155 m in vertical  height as of August 2023 and now down to 27 m as of September 2023 OD.  Surgery to decrease the risk of outer photoreceptor layer drusenoid appearance has been accomplished however continues to have significant CME.  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  We will stay on Pred forte 1 drop every other day  Cystoid macular edema of right eye Overall improved yet still active on only twice daily ketorolac will increase ketorolac now to 4 times daily  Patient reports having Prolensa at home can substitute Prolensa 1 drop twice daily which is the equivalent of ketorolac 1 drop 4 times daily to the right eye  Intermediate stage nonexudative age-related macular degeneration of both eyes Large subfoveal drusenoid deposit right eye present prior to vitrectomy membrane peel has nearly resolved.     ICD-10-CM   1. Glaucomatocyclitic crisis of right eye, moderate stage  H40.41X2 OCT, Retina - OU - Both Eyes    2. Allergic contact dermatitis due to drugs in contact with skin  L23.3     3. Cystoid macular edema of right eye  H35.351     4. Intermediate stage nonexudative age-related macular degeneration of both eyes  H35.3132       1.  OD, will  increase topical NSAID ketorolac to 1 drop right eye 4 times daily  2.  Need to minimize the risk of steroid-induced glaucoma, will do the taper the topical medication 1 drop right eye every other day, proposing to use the even numbered days of a calendar  3.  Ophthalmic Meds Ordered this visit:  No orders of the defined types were placed in this encounter.      No follow-ups on file.  Patient Instructions  Pred forte (pink top) 1 drop right eye every other  day  Ketorolac (gray top) 1 drop right eye 4 times daily  Explained the diagnoses, plan, and follow up with the patient and they expressed understanding.  Patient expressed understanding of the importance of proper follow up care.   Clent Demark Gerrit Rafalski M.D. Diseases & Surgery of the Retina and Vitreous [facility] [place] 11/22/21     Abbreviations: M myopia (nearsighted); A astigmatism; H hyperopia (farsighted); P presbyopia; Mrx spectacle prescription;  CTL contact lenses; OD right eye; OS left eye; OU both eyes  XT exotropia; ET esotropia; PEK punctate epithelial keratitis; PEE punctate epithelial erosions; DES dry eye syndrome; MGD meibomian gland dysfunction; ATs artificial tears; PFAT's preservative free artificial tears; [place] nuclear sclerotic cataract; PSC posterior subcapsular cataract; ERM epi-retinal membrane; PVD posterior vitreous detachment; RD retinal detachment; DM diabetes mellitus; DR diabetic retinopathy; NPDR non-proliferative diabetic retinopathy; PDR proliferative diabetic retinopathy; CSME clinically significant macular edema; DME diabetic macular edema; dbh dot blot hemorrhages; CWS cotton wool spot; POAG primary open angle glaucoma; C/D cup-to-disc ratio; HVF humphrey visual field; GVF goldmann visual field; OCT optical coherence tomography; IOP intraocular pressure; BRVO Branch retinal vein occlusion; CRVO central retinal vein occlusion; CRAO central retinal artery occlusion; BRAO branch retinal artery occlusion; RT retinal tear; SB scleral buckle; PPV pars plana vitrectomy; VH Vitreous hemorrhage; PRP panretinal laser photocoagulation; IVK intravitreal kenalog; VMT vitreomacular traction; MH Macular hole;  NVD neovascularization of the disc; NVE neovascularization elsewhere; AREDS age related eye disease study; ARMD age related macular degeneration; POAG primary open angle glaucoma; EBMD epithelial/anterior basement membrane dystrophy; ACIOL anterior chamber intraocular  lens; IOL intraocular lens; PCIOL posterior chamber intraocular lens; Phaco/IOL phacoemulsification with intraocular lens placement; [place] photorefractive keratectomy; LASIK laser assisted in situ keratomileusis; HTN hypertension; DM diabetes mellitus; COPD chronic obstructive pulmonary disease

## 2021-11-22 NOTE — Patient Instructions (Signed)
Pred forte (pink top) 1 drop right eye every other day  Ketorolac (gray top) 1 drop right eye 4 times daily

## 2021-11-22 NOTE — Assessment & Plan Note (Signed)
Overall improved yet still active on only twice daily ketorolac will increase ketorolac now to 4 times daily  Patient reports having Prolensa at home can substitute Prolensa 1 drop twice daily which is the equivalent of ketorolac 1 drop 4 times daily to the right eye

## 2021-11-22 NOTE — Assessment & Plan Note (Signed)
Large subfoveal drusenoid deposit right eye present prior to vitrectomy membrane peel has nearly resolved.

## 2021-11-22 NOTE — Assessment & Plan Note (Signed)
No signs of recurrence.  We will stay on Pred forte 1 drop every other day

## 2021-11-25 ENCOUNTER — Encounter (INDEPENDENT_AMBULATORY_CARE_PROVIDER_SITE_OTHER): Payer: Self-pay

## 2021-11-25 DIAGNOSIS — H35371 Puckering of macula, right eye: Secondary | ICD-10-CM | POA: Diagnosis not present

## 2021-11-25 DIAGNOSIS — H40013 Open angle with borderline findings, low risk, bilateral: Secondary | ICD-10-CM | POA: Diagnosis not present

## 2021-11-25 DIAGNOSIS — H3581 Retinal edema: Secondary | ICD-10-CM | POA: Diagnosis not present

## 2021-11-25 DIAGNOSIS — H353132 Nonexudative age-related macular degeneration, bilateral, intermediate dry stage: Secondary | ICD-10-CM | POA: Diagnosis not present

## 2021-12-04 ENCOUNTER — Other Ambulatory Visit: Payer: Self-pay | Admitting: Allergy and Immunology

## 2021-12-04 ENCOUNTER — Other Ambulatory Visit (HOSPITAL_COMMUNITY): Payer: Self-pay | Admitting: Physician Assistant

## 2021-12-09 ENCOUNTER — Other Ambulatory Visit (INDEPENDENT_AMBULATORY_CARE_PROVIDER_SITE_OTHER): Payer: Self-pay | Admitting: Ophthalmology

## 2021-12-13 DIAGNOSIS — J4550 Severe persistent asthma, uncomplicated: Secondary | ICD-10-CM | POA: Diagnosis not present

## 2021-12-14 ENCOUNTER — Ambulatory Visit (INDEPENDENT_AMBULATORY_CARE_PROVIDER_SITE_OTHER): Payer: Medicare Other

## 2021-12-14 DIAGNOSIS — J4550 Severe persistent asthma, uncomplicated: Secondary | ICD-10-CM | POA: Diagnosis not present

## 2021-12-18 DIAGNOSIS — Z23 Encounter for immunization: Secondary | ICD-10-CM | POA: Diagnosis not present

## 2021-12-21 ENCOUNTER — Ambulatory Visit (INDEPENDENT_AMBULATORY_CARE_PROVIDER_SITE_OTHER): Payer: Medicare Other | Admitting: Allergy and Immunology

## 2021-12-21 VITALS — BP 124/76 | HR 89 | Temp 98.1°F | Resp 16 | Ht 69.0 in | Wt 150.2 lb

## 2021-12-21 DIAGNOSIS — K219 Gastro-esophageal reflux disease without esophagitis: Secondary | ICD-10-CM

## 2021-12-21 DIAGNOSIS — J4550 Severe persistent asthma, uncomplicated: Secondary | ICD-10-CM

## 2021-12-21 DIAGNOSIS — D472 Monoclonal gammopathy: Secondary | ICD-10-CM

## 2021-12-21 DIAGNOSIS — M3130 Wegener's granulomatosis without renal involvement: Secondary | ICD-10-CM

## 2021-12-21 DIAGNOSIS — J3089 Other allergic rhinitis: Secondary | ICD-10-CM

## 2021-12-21 DIAGNOSIS — J3489 Other specified disorders of nose and nasal sinuses: Secondary | ICD-10-CM | POA: Diagnosis not present

## 2021-12-21 MED ORDER — ESOMEPRAZOLE MAGNESIUM 40 MG PO CPDR: 40.0000 mg | DELAYED_RELEASE_CAPSULE | Freq: Two times a day (BID) | ORAL | 5 refills | Status: DC

## 2021-12-21 MED ORDER — ALBUTEROL SULFATE HFA 108 (90 BASE) MCG/ACT IN AERS: 2.0000 | INHALATION_SPRAY | RESPIRATORY_TRACT | 1 refills | Status: DC | PRN

## 2021-12-21 MED ORDER — MONTELUKAST SODIUM 10 MG PO TABS: 10.0000 mg | ORAL_TABLET | Freq: Every day | ORAL | 3 refills | Status: DC

## 2021-12-21 MED ORDER — BUDESONIDE-FORMOTEROL FUMARATE 160-4.5 MCG/ACT IN AERO: 2.0000 | INHALATION_SPRAY | Freq: Two times a day (BID) | RESPIRATORY_TRACT | 1 refills | Status: DC

## 2021-12-21 NOTE — Patient Instructions (Addendum)
  1.  Continue to treat and prevent inflammation:   A. Symbicort 160 - 2 inhalations 1-2 times per day    B. Montelukast 10 mg - 1 tablet 1 time per day  C. Benralizumab injections    2.  Continue to treat and prevent reflux:   A. Minimize caffeine consumption   B. Nexium 40 mg - 1-2 times per day  3. If needed:   A. albuterol HFA - 2 inhalations every 4-6 hours  B.  Azelastine-1-2 sprays each nostril 1-2 times per day  4. Return to clinic in 6 months or earlier if problem  5.  Obtain RSV vaccine

## 2021-12-21 NOTE — Progress Notes (Unsigned)
[facility] [facility] [place] [facility] [place]   Follow-up Note  Referring Provider: Tisovec, Fransico Him, MD Primary Provider: Haywood Pao, MD Date of Office Visit: 12/21/2021  Subjective:   Chad Morrison (DOB: January 08, 1933) is a 88 y.o. 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  I last saw him in this clinic 22 June 2021.  He has really done well since his last visit without any significant issues involving his respiratory tract and no need to use a short acting bronchodilator no need to receive a systemic steroid or an antibiotic to treat an airway issue.  He continues to use his Symbicort twice a day on a regular basis as well as montelukast and also continues on benralizumab injections.  He is doing very well with his reflux while using Nexium.  He has obtained this years flu vaccine.  Allergies as of 12/21/2021       Reactions   Other Other (See Comments)   Latex Gloves   Penicillins Hives   Has patient had a PCN reaction causing immediate rash, facial/tongue/throat swelling, SOB or lightheadedness with hypotension: No Has patient had a PCN reaction causing severe rash involving mucus membranes or skin necrosis: No Has patient had a PCN reaction that required hospitalization: No Has patient had a PCN reaction occurring within the last 10 years: Yes If all of the above answers are "NO", then may proceed with Cephalosporin use.   Sulfa Antibiotics Other (See Comments)   Unknown to patient   Sulfamethoxazole-trimethoprim Other (See Comments)   Latex Hives, Rash        Medication List    albuterol 108 (90 Base) MCG/ACT inhaler Commonly known as:  VENTOLIN HFA Inhale 2 puffs into the lungs every 4 (four) hours as needed for wheezing or shortness of breath.   ALPRAZolam 0.25 MG tablet Commonly known as: XANAX Take 0.25 mg by mouth at bedtime as needed for sleep.   azelastine 0.1 % nasal spray Commonly known as: ASTELIN Place 1 spray into both nostrils 2 (two) times daily.   brimonidine 0.15 % ophthalmic solution Commonly known as: ALPHAGAN INSTILL 1 DROP IN RIGHT EYE EVERY 8 HOURS   budesonide-formoterol 160-4.5 MCG/ACT inhaler Commonly known as: Symbicort Inhale 2 puffs into the lungs in the morning and at bedtime.   Calcium-Vitamin D 600-125 MG-UNIT Tabs Take 1 tablet by mouth 2 (two) times daily.   diltiazem 180 MG 24 hr capsule Commonly known as: CARDIZEM CD TAKE 1 CAPSULE EVERY       MORNING AND AT BEDTIME   Eliquis 5 MG Tabs tablet Generic drug: apixaban TAKE 1 TABLET TWICE A DAY   EPINEPHrine 0.3 mg/0.3 mL [place] injection Commonly known as: EPI-PEN SMARTSIG:0.3 Milligram(s) IM Once   esomeprazole 40 MG capsule Commonly known as: NEXIUM Take 1 capsule (40 mg total) by mouth 2 (two) times daily.   FASENRA [place] Inject into the skin every 8 (eight) weeks.   fluorouracil 5 % cream Commonly known as: EFUDEX SMARTSIG:sparingly Topical Twice Daily   ketorolac 0.5 % ophthalmic solution Commonly known as: ACULAR Place 1 drop into the right eye 3 (three) times daily.   levocetirizine 5 MG tablet Commonly known as: XYZAL Take 1 tablet (5 mg total)  by mouth 2 (two) times daily as needed for allergies (Can take an extra dose during flare ups.).   levothyroxine 75 MCG tablet Commonly known as: SYNTHROID Take 75 mcg by mouth daily before breakfast.   loteprednol 0.5 % ophthalmic suspension Commonly known as: LOTEMAX Place 1 drop into the right eye daily.   montelukast 10 MG tablet Commonly known as: SINGULAIR Take 1 tablet (10 mg total) by mouth at bedtime.   multivitamin with minerals Tabs tablet Take 1  tablet daily by mouth.   OCUVITE PRESERVISION PO Take 1 tablet 2 (two) times daily by mouth.   Propylene Glycol 0.6 % Soln Apply 1 drop to eye in the morning.   rosuvastatin 5 MG tablet Commonly known as: CRESTOR TAKE 1 TABLET DAILY   SLOW FE PO Take 1 tablet by mouth every morning.   SYSTANE OP Apply 1 drop to eye every morning.   VITAMIN C PO Take 1 tablet by mouth every morning. Not sure the dosage    Past Medical History:  Diagnosis Date   Anxiety    Asthma    Asthmatic bronchitis    Atrial flutter ([place])    chads2vasc score is 3   Ehrlichiosis 3710   GERD (gastroesophageal reflux disease)    Granulomatosis with polyangiitis (HCC)    HTN (hypertension)    Hyperlipidemia    Nuclear sclerotic cataract of left eye 06/04/2019   Cataract surgery completed March 2023, Dr. [name]   Paroxysmal atrial fibrillation [facility])    Prostate cancer ([place])    Seasonal allergies     Past Surgical History:  Procedure Laterality Date   BASAL CELL CARCINOMA EXCISION  2013   EYELID   HERNIA REPAIR Bilateral 2000, 2002   KNEE ARTHROSCOPY  2000   prostectomy  [place]    Review of systems negative except as noted in HPI / PMHx or noted below:  Review of Systems  Constitutional: Negative.   HENT: Negative.    Eyes: Negative.   Respiratory: Negative.    Cardiovascular: Negative.   Gastrointestinal: Negative.   Genitourinary: Negative.   Musculoskeletal: Negative.   Skin: Negative.   Neurological: Negative.   Endo/Heme/Allergies: Negative.   Psychiatric/Behavioral: Negative.       Objective:   Vitals:   12/21/21 1119  BP: 124/76  Resp: 16  Temp: 98.1 F (36.7 C)   Height: '5\' 9"'$  (175.3 cm)      Physical Exam Constitutional:      Appearance: He is not diaphoretic.  HENT:     Head: Normocephalic.     Right Ear: Tympanic membrane, ear canal and external ear normal.     Left Ear: Tympanic membrane, ear canal and external ear normal.      Nose: No mucosal edema (Septal perforation with clear borders) or rhinorrhea.     Mouth/Throat:     Pharynx: Uvula midline. No oropharyngeal exudate.  Eyes:     Conjunctiva/sclera: Conjunctivae normal.  Neck:     Thyroid: No thyromegaly.     Trachea: Trachea normal. No tracheal tenderness or tracheal deviation.  Cardiovascular:     Rate and Rhythm: Normal rate and regular rhythm.     Heart sounds: S1 normal and S2 normal. Murmur (Systolic) heard.  Pulmonary:     Effort: No respiratory distress.     Breath sounds: Normal breath sounds. No stridor. No wheezing or rales.  Lymphadenopathy:     Head:     Right side  of head: No tonsillar adenopathy.     Left side of head: No tonsillar adenopathy.     Cervical: No cervical adenopathy.  Skin:    Findings: No erythema or rash.     Nails: There is no clubbing.  Neurological:     Mental Status: He is alert.     Diagnostics:    Spirometry was performed and demonstrated an FEV1 of 2.45 at 100 % of predicted.  Results of blood tests obtained 22 June 2021 identifies M spike 0.3G/DL.  His previous M spike from 04 August 2020 was 0.4G/DL  Assessment and Plan:   1. Asthma, severe persistent, well-controlled   2. Perennial allergic rhinitis   3. Nasal septal perforation   4. LPRD (laryngopharyngeal reflux disease)   5. Monoclonal gammopathy   6. 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  Continue to treat and prevent inflammation:   A. Symbicort 160 - 2 inhalations 1-2 times per day    B. Montelukast 10 mg - 1 tablet 1 time per day  C. Benralizumab injections    2.  Continue to treat and prevent reflux:   A. Minimize caffeine consumption   B. Nexium 40 mg - 1-2 times per day  3. If needed:   A. albuterol HFA - 2 inhalations every 4-6 hours  B.  Azelastine-1-2 sprays each nostril 1-2 times per day  4. Return to clinic in 6 months or earlier if problem  5.  Obtain RSV vaccine  Chad Morrison has really done  very well since his last visit and he will continue to use a collection of anti-inflammatory agents for his airway including use of anti-IL-5 biologic agent and continue to address his reflux as noted above.  We will wait another year before we recheck his M spike.  I will see him back in this clinic in 6 months or earlier if there is a problem.   Allena Katz, MD Allergy / Immunology [place]

## 2021-12-22 ENCOUNTER — Encounter: Payer: Self-pay | Admitting: Allergy and Immunology

## 2021-12-27 ENCOUNTER — Encounter (INDEPENDENT_AMBULATORY_CARE_PROVIDER_SITE_OTHER): Payer: Medicare Other | Admitting: Ophthalmology

## 2022-01-04 ENCOUNTER — Encounter: Payer: Self-pay | Admitting: Allergy and Immunology

## 2022-01-06 ENCOUNTER — Encounter (INDEPENDENT_AMBULATORY_CARE_PROVIDER_SITE_OTHER): Payer: Medicare Other | Admitting: Ophthalmology

## 2022-01-06 DIAGNOSIS — H35371 Puckering of macula, right eye: Secondary | ICD-10-CM | POA: Diagnosis not present

## 2022-01-06 DIAGNOSIS — H18221 Idiopathic corneal edema, right eye: Secondary | ICD-10-CM | POA: Diagnosis not present

## 2022-01-06 DIAGNOSIS — H353132 Nonexudative age-related macular degeneration, bilateral, intermediate dry stage: Secondary | ICD-10-CM | POA: Diagnosis not present

## 2022-01-06 DIAGNOSIS — H35351 Cystoid macular degeneration, right eye: Secondary | ICD-10-CM | POA: Diagnosis not present

## 2022-01-06 DIAGNOSIS — H353212 Exudative age-related macular degeneration, right eye, with inactive choroidal neovascularization: Secondary | ICD-10-CM | POA: Diagnosis not present

## 2022-01-06 DIAGNOSIS — H04122 Dry eye syndrome of left lacrimal gland: Secondary | ICD-10-CM | POA: Diagnosis not present

## 2022-02-01 ENCOUNTER — Telehealth: Payer: Self-pay | Admitting: *Deleted

## 2022-02-01 NOTE — Telephone Encounter (Signed)
Patient called and stated that he received a letter from insurance and stated that insurance will no longer cover Symbicort next year and it will cost $300.00. He did state that they gave some preferred alternatives such as Advair and Breo. He recalls using Advair in the past and it did not work for him. He states that if you feel it would work we could try [place] or call [phone] to do a review to see if we can get the Symbicort to continue to be covered.

## 2022-02-02 ENCOUNTER — Other Ambulatory Visit: Payer: Self-pay | Admitting: *Deleted

## 2022-02-02 MED ORDER — BREO ELLIPTA 200-25 MCG/ACT IN AEPB: 1.0000 | INHALATION_SPRAY | Freq: Every day | RESPIRATORY_TRACT | 1 refills | Status: DC

## 2022-02-02 NOTE — Telephone Encounter (Signed)
New prescription has been sent in to his pharmacy. Called the patient and advised of change, patient verbalized understanding.

## 2022-02-03 DIAGNOSIS — Z7901 Long term (current) use of anticoagulants: Secondary | ICD-10-CM | POA: Diagnosis not present

## 2022-02-03 DIAGNOSIS — M8580 Other specified disorders of bone density and structure, unspecified site: Secondary | ICD-10-CM | POA: Diagnosis not present

## 2022-02-03 DIAGNOSIS — R946 Abnormal results of thyroid function studies: Secondary | ICD-10-CM | POA: Diagnosis not present

## 2022-02-03 DIAGNOSIS — M3130 Wegener's granulomatosis without renal involvement: Secondary | ICD-10-CM | POA: Diagnosis not present

## 2022-02-03 DIAGNOSIS — I350 Nonrheumatic aortic (valve) stenosis: Secondary | ICD-10-CM | POA: Diagnosis not present

## 2022-02-03 DIAGNOSIS — I700 Atherosclerosis of aorta: Secondary | ICD-10-CM | POA: Diagnosis not present

## 2022-02-03 DIAGNOSIS — I4892 Unspecified atrial flutter: Secondary | ICD-10-CM | POA: Diagnosis not present

## 2022-02-03 DIAGNOSIS — Z8546 Personal history of malignant neoplasm of prostate: Secondary | ICD-10-CM | POA: Diagnosis not present

## 2022-02-03 DIAGNOSIS — J849 Interstitial pulmonary disease, unspecified: Secondary | ICD-10-CM | POA: Diagnosis not present

## 2022-02-03 DIAGNOSIS — J4540 Moderate persistent asthma, uncomplicated: Secondary | ICD-10-CM | POA: Diagnosis not present

## 2022-02-03 DIAGNOSIS — D8989 Other specified disorders involving the immune mechanism, not elsewhere classified: Secondary | ICD-10-CM | POA: Diagnosis not present

## 2022-02-03 DIAGNOSIS — R2689 Other abnormalities of gait and mobility: Secondary | ICD-10-CM | POA: Diagnosis not present

## 2022-02-08 ENCOUNTER — Ambulatory Visit: Payer: Medicare Other

## 2022-02-09 ENCOUNTER — Ambulatory Visit (INDEPENDENT_AMBULATORY_CARE_PROVIDER_SITE_OTHER): Payer: Medicare Other

## 2022-02-09 DIAGNOSIS — J4550 Severe persistent asthma, uncomplicated: Secondary | ICD-10-CM

## 2022-02-10 DIAGNOSIS — H353212 Exudative age-related macular degeneration, right eye, with inactive choroidal neovascularization: Secondary | ICD-10-CM | POA: Diagnosis not present

## 2022-02-10 DIAGNOSIS — H35371 Puckering of macula, right eye: Secondary | ICD-10-CM | POA: Diagnosis not present

## 2022-02-10 DIAGNOSIS — H18221 Idiopathic corneal edema, right eye: Secondary | ICD-10-CM | POA: Diagnosis not present

## 2022-02-10 DIAGNOSIS — H35351 Cystoid macular degeneration, right eye: Secondary | ICD-10-CM | POA: Diagnosis not present

## 2022-02-10 DIAGNOSIS — H353132 Nonexudative age-related macular degeneration, bilateral, intermediate dry stage: Secondary | ICD-10-CM | POA: Diagnosis not present

## 2022-02-10 DIAGNOSIS — H4040X0 Glaucoma secondary to eye inflammation, unspecified eye, stage unspecified: Secondary | ICD-10-CM | POA: Diagnosis not present

## 2022-02-10 DIAGNOSIS — H04122 Dry eye syndrome of left lacrimal gland: Secondary | ICD-10-CM | POA: Diagnosis not present

## 2022-02-24 DIAGNOSIS — H4040X0 Glaucoma secondary to eye inflammation, unspecified eye, stage unspecified: Secondary | ICD-10-CM | POA: Diagnosis not present

## 2022-02-24 DIAGNOSIS — H18221 Idiopathic corneal edema, right eye: Secondary | ICD-10-CM | POA: Diagnosis not present

## 2022-02-24 DIAGNOSIS — H35351 Cystoid macular degeneration, right eye: Secondary | ICD-10-CM | POA: Diagnosis not present

## 2022-02-24 DIAGNOSIS — H353212 Exudative age-related macular degeneration, right eye, with inactive choroidal neovascularization: Secondary | ICD-10-CM | POA: Diagnosis not present

## 2022-02-24 DIAGNOSIS — H35371 Puckering of macula, right eye: Secondary | ICD-10-CM | POA: Diagnosis not present

## 2022-02-24 DIAGNOSIS — H353132 Nonexudative age-related macular degeneration, bilateral, intermediate dry stage: Secondary | ICD-10-CM | POA: Diagnosis not present

## 2022-02-24 DIAGNOSIS — H30041 Focal chorioretinal inflammation, macular or paramacular, right eye: Secondary | ICD-10-CM | POA: Diagnosis not present

## 2022-02-24 DIAGNOSIS — H04122 Dry eye syndrome of left lacrimal gland: Secondary | ICD-10-CM | POA: Diagnosis not present

## 2022-02-24 DIAGNOSIS — H30001 Unspecified focal chorioretinal inflammation, right eye: Secondary | ICD-10-CM | POA: Diagnosis not present

## 2022-02-25 ENCOUNTER — Other Ambulatory Visit: Payer: Self-pay | Admitting: Allergy and Immunology

## 2022-03-03 DIAGNOSIS — L821 Other seborrheic keratosis: Secondary | ICD-10-CM | POA: Diagnosis not present

## 2022-03-03 DIAGNOSIS — Z85820 Personal history of malignant melanoma of skin: Secondary | ICD-10-CM | POA: Diagnosis not present

## 2022-03-03 DIAGNOSIS — Z85828 Personal history of other malignant neoplasm of skin: Secondary | ICD-10-CM | POA: Diagnosis not present

## 2022-03-03 DIAGNOSIS — C44319 Basal cell carcinoma of skin of other parts of face: Secondary | ICD-10-CM | POA: Diagnosis not present

## 2022-03-03 DIAGNOSIS — L570 Actinic keratosis: Secondary | ICD-10-CM | POA: Diagnosis not present

## 2022-03-03 DIAGNOSIS — D485 Neoplasm of uncertain behavior of skin: Secondary | ICD-10-CM | POA: Diagnosis not present

## 2022-03-03 DIAGNOSIS — D1801 Hemangioma of skin and subcutaneous tissue: Secondary | ICD-10-CM | POA: Diagnosis not present

## 2022-03-03 DIAGNOSIS — Z08 Encounter for follow-up examination after completed treatment for malignant neoplasm: Secondary | ICD-10-CM | POA: Diagnosis not present

## 2022-03-03 DIAGNOSIS — L814 Other melanin hyperpigmentation: Secondary | ICD-10-CM | POA: Diagnosis not present

## 2022-03-03 DIAGNOSIS — C44612 Basal cell carcinoma of skin of right upper limb, including shoulder: Secondary | ICD-10-CM | POA: Diagnosis not present

## 2022-03-10 DIAGNOSIS — H35351 Cystoid macular degeneration, right eye: Secondary | ICD-10-CM | POA: Diagnosis not present

## 2022-03-10 DIAGNOSIS — H04122 Dry eye syndrome of left lacrimal gland: Secondary | ICD-10-CM | POA: Diagnosis not present

## 2022-03-10 DIAGNOSIS — H18221 Idiopathic corneal edema, right eye: Secondary | ICD-10-CM | POA: Diagnosis not present

## 2022-03-10 DIAGNOSIS — H353132 Nonexudative age-related macular degeneration, bilateral, intermediate dry stage: Secondary | ICD-10-CM | POA: Diagnosis not present

## 2022-03-10 DIAGNOSIS — H353212 Exudative age-related macular degeneration, right eye, with inactive choroidal neovascularization: Secondary | ICD-10-CM | POA: Diagnosis not present

## 2022-03-10 DIAGNOSIS — H30041 Focal chorioretinal inflammation, macular or paramacular, right eye: Secondary | ICD-10-CM | POA: Diagnosis not present

## 2022-03-10 DIAGNOSIS — H35371 Puckering of macula, right eye: Secondary | ICD-10-CM | POA: Diagnosis not present

## 2022-03-16 ENCOUNTER — Telehealth: Payer: Self-pay | Admitting: Allergy and Immunology

## 2022-03-16 MED ORDER — BUDESONIDE-FORMOTEROL FUMARATE 160-4.5 MCG/ACT IN AERO: 2.0000 | INHALATION_SPRAY | Freq: Two times a day (BID) | RESPIRATORY_TRACT | 3 refills | Status: DC

## 2022-03-16 NOTE — Telephone Encounter (Signed)
Is it ok to switch back to Symbicort for the patient?

## 2022-03-16 NOTE — Telephone Encounter (Signed)
Patient would called to check on message. Would like a call back.

## 2022-03-16 NOTE — Telephone Encounter (Signed)
Patient called and said that the [facility] made his chest hurt and he would like to go back to the symbicort. [facility] [address]  [phone]

## 2022-03-16 NOTE — Telephone Encounter (Signed)
Per provider: Yes, we can have him go back to using his Symbicort as previously prescribed.    Called patient - DOB/Pharmacy verified - advised of provider notation above. Patient stated Symbicort will require PA - advised our would process PA if needed.  Patient verbalized understanding, no further questions.

## 2022-03-17 ENCOUNTER — Other Ambulatory Visit: Payer: Self-pay | Admitting: Allergy and Immunology

## 2022-03-18 ENCOUNTER — Other Ambulatory Visit (HOSPITAL_COMMUNITY): Payer: Self-pay

## 2022-03-18 ENCOUNTER — Telehealth: Payer: Self-pay

## 2022-03-18 NOTE — Telephone Encounter (Signed)
PA request received via providers office for Symbicort 160-4.5MCG/ACT aerosol  PA has been submitted to [facility] and is pending determination.   Key: Kerry Dory

## 2022-03-18 NOTE — Telephone Encounter (Signed)
PA has been APPROVED from 03/18/2022-03/18/2023

## 2022-03-18 NOTE — Telephone Encounter (Signed)
PA has been completed and will be updated in additional encounter.

## 2022-03-21 ENCOUNTER — Other Ambulatory Visit: Payer: Self-pay

## 2022-03-21 MED ORDER — BUDESONIDE-FORMOTEROL FUMARATE 160-4.5 MCG/ACT IN AERO: 2.0000 | INHALATION_SPRAY | Freq: Two times a day (BID) | RESPIRATORY_TRACT | 0 refills | Status: DC

## 2022-03-25 NOTE — Telephone Encounter (Signed)
This PA has been approved. Please sign off on order; thank you

## 2022-03-30 NOTE — Telephone Encounter (Signed)
Please sign off on this encounter, thank you!

## 2022-04-05 ENCOUNTER — Ambulatory Visit (INDEPENDENT_AMBULATORY_CARE_PROVIDER_SITE_OTHER): Payer: Medicare Other

## 2022-04-05 DIAGNOSIS — J4550 Severe persistent asthma, uncomplicated: Secondary | ICD-10-CM | POA: Diagnosis not present

## 2022-04-07 ENCOUNTER — Encounter (HOSPITAL_COMMUNITY): Payer: Self-pay | Admitting: *Deleted

## 2022-04-07 DIAGNOSIS — H35371 Puckering of macula, right eye: Secondary | ICD-10-CM | POA: Diagnosis not present

## 2022-04-07 DIAGNOSIS — H353122 Nonexudative age-related macular degeneration, left eye, intermediate dry stage: Secondary | ICD-10-CM | POA: Diagnosis not present

## 2022-04-07 DIAGNOSIS — H353114 Nonexudative age-related macular degeneration, right eye, advanced atrophic with subfoveal involvement: Secondary | ICD-10-CM | POA: Diagnosis not present

## 2022-04-07 DIAGNOSIS — H353212 Exudative age-related macular degeneration, right eye, with inactive choroidal neovascularization: Secondary | ICD-10-CM | POA: Diagnosis not present

## 2022-04-07 DIAGNOSIS — H04122 Dry eye syndrome of left lacrimal gland: Secondary | ICD-10-CM | POA: Diagnosis not present

## 2022-04-07 DIAGNOSIS — H35351 Cystoid macular degeneration, right eye: Secondary | ICD-10-CM | POA: Diagnosis not present

## 2022-04-07 DIAGNOSIS — H30041 Focal chorioretinal inflammation, macular or paramacular, right eye: Secondary | ICD-10-CM | POA: Diagnosis not present

## 2022-04-11 ENCOUNTER — Ambulatory Visit (HOSPITAL_COMMUNITY): Payer: Medicare Other | Attending: Internal Medicine

## 2022-04-11 DIAGNOSIS — I4891 Unspecified atrial fibrillation: Secondary | ICD-10-CM

## 2022-04-11 DIAGNOSIS — I350 Nonrheumatic aortic (valve) stenosis: Secondary | ICD-10-CM | POA: Diagnosis not present

## 2022-04-11 LAB — ECHOCARDIOGRAM COMPLETE
AR max vel: 0.64 cm2
AV Area VTI: 0.68 cm2
AV Area mean vel: 0.63 cm2
AV Mean grad: 42.5 mmHg
AV Peak grad: 70 mmHg
Ao pk vel: 4.18 m/s
Area-P 1/2: 4.91 cm2
S' Lateral: 2.7 cm

## 2022-04-12 ENCOUNTER — Emergency Department (HOSPITAL_COMMUNITY): Payer: Medicare Other

## 2022-04-12 ENCOUNTER — Other Ambulatory Visit: Payer: Self-pay

## 2022-04-12 ENCOUNTER — Inpatient Hospital Stay (HOSPITAL_COMMUNITY): Payer: Medicare Other

## 2022-04-12 ENCOUNTER — Observation Stay (HOSPITAL_COMMUNITY)
Admission: EM | Admit: 2022-04-12 | Discharge: 2022-04-13 | Disposition: A | Discharge status: Home / Self Care | Payer: Medicare Other | Attending: Internal Medicine | Admitting: Internal Medicine

## 2022-04-12 ENCOUNTER — Encounter (HOSPITAL_COMMUNITY): Payer: Self-pay

## 2022-04-12 DIAGNOSIS — Z91040 Latex allergy status: Secondary | ICD-10-CM | POA: Diagnosis not present

## 2022-04-12 DIAGNOSIS — M79652 Pain in left thigh: Secondary | ICD-10-CM | POA: Diagnosis not present

## 2022-04-12 DIAGNOSIS — A419 Sepsis, unspecified organism: Secondary | ICD-10-CM | POA: Diagnosis not present

## 2022-04-12 DIAGNOSIS — R531 Weakness: Secondary | ICD-10-CM | POA: Diagnosis not present

## 2022-04-12 DIAGNOSIS — J45909 Unspecified asthma, uncomplicated: Secondary | ICD-10-CM | POA: Diagnosis present

## 2022-04-12 DIAGNOSIS — I6782 Cerebral ischemia: Secondary | ICD-10-CM | POA: Diagnosis not present

## 2022-04-12 DIAGNOSIS — G319 Degenerative disease of nervous system, unspecified: Secondary | ICD-10-CM | POA: Diagnosis not present

## 2022-04-12 DIAGNOSIS — I10 Essential (primary) hypertension: Secondary | ICD-10-CM | POA: Diagnosis not present

## 2022-04-12 DIAGNOSIS — Z79899 Other long term (current) drug therapy: Secondary | ICD-10-CM | POA: Insufficient documentation

## 2022-04-12 DIAGNOSIS — R4182 Altered mental status, unspecified: Secondary | ICD-10-CM | POA: Diagnosis not present

## 2022-04-12 DIAGNOSIS — Z1152 Encounter for screening for COVID-19: Secondary | ICD-10-CM | POA: Diagnosis not present

## 2022-04-12 DIAGNOSIS — I4891 Unspecified atrial fibrillation: Secondary | ICD-10-CM | POA: Diagnosis not present

## 2022-04-12 DIAGNOSIS — Z85828 Personal history of other malignant neoplasm of skin: Secondary | ICD-10-CM | POA: Diagnosis not present

## 2022-04-12 DIAGNOSIS — J449 Chronic obstructive pulmonary disease, unspecified: Secondary | ICD-10-CM | POA: Diagnosis present

## 2022-04-12 DIAGNOSIS — J189 Pneumonia, unspecified organism: Secondary | ICD-10-CM | POA: Diagnosis not present

## 2022-04-12 DIAGNOSIS — Z8546 Personal history of malignant neoplasm of prostate: Secondary | ICD-10-CM | POA: Diagnosis not present

## 2022-04-12 DIAGNOSIS — Z7901 Long term (current) use of anticoagulants: Secondary | ICD-10-CM | POA: Insufficient documentation

## 2022-04-12 DIAGNOSIS — R509 Fever, unspecified: Secondary | ICD-10-CM | POA: Diagnosis not present

## 2022-04-12 DIAGNOSIS — J441 Chronic obstructive pulmonary disease with (acute) exacerbation: Secondary | ICD-10-CM | POA: Diagnosis not present

## 2022-04-12 DIAGNOSIS — R29818 Other symptoms and signs involving the nervous system: Secondary | ICD-10-CM | POA: Diagnosis not present

## 2022-04-12 DIAGNOSIS — I480 Paroxysmal atrial fibrillation: Secondary | ICD-10-CM | POA: Insufficient documentation

## 2022-04-12 DIAGNOSIS — I4821 Permanent atrial fibrillation: Secondary | ICD-10-CM | POA: Diagnosis present

## 2022-04-12 DIAGNOSIS — R911 Solitary pulmonary nodule: Secondary | ICD-10-CM | POA: Diagnosis not present

## 2022-04-12 DIAGNOSIS — Z87891 Personal history of nicotine dependence: Secondary | ICD-10-CM | POA: Insufficient documentation

## 2022-04-12 DIAGNOSIS — J479 Bronchiectasis, uncomplicated: Secondary | ICD-10-CM | POA: Diagnosis not present

## 2022-04-12 DIAGNOSIS — R410 Disorientation, unspecified: Secondary | ICD-10-CM | POA: Diagnosis not present

## 2022-04-12 DIAGNOSIS — S022XXA Fracture of nasal bones, initial encounter for closed fracture: Secondary | ICD-10-CM | POA: Diagnosis not present

## 2022-04-12 LAB — CBG MONITORING, ED: Glucose-Capillary: 97 mg/dL (ref 70–99)

## 2022-04-12 LAB — BLOOD GAS, VENOUS
Acid-base deficit: 2.2 mmol/L — ABNORMAL HIGH (ref 0.0–2.0)
Bicarbonate: 22.4 mmol/L (ref 20.0–28.0)
O2 Saturation: 26.3 %
Patient temperature: 37
pCO2, Ven: 37 mmHg — ABNORMAL LOW (ref 44–60)
pH, Ven: 7.39 (ref 7.25–7.43)
pO2, Ven: <31 mmHg — CL (ref 32–45)

## 2022-04-12 LAB — HEPATIC FUNCTION PANEL
ALT: 22 U/L (ref 0–44)
AST: 28 U/L (ref 15–41)
Albumin: 3.4 g/dL — ABNORMAL LOW (ref 3.5–5.0)
Alkaline Phosphatase: 60 U/L (ref 38–126)
Bilirubin, Direct: 0.1 mg/dL (ref 0.0–0.2)
Indirect Bilirubin: 0.5 mg/dL (ref 0.3–0.9)
Total Bilirubin: 0.6 mg/dL (ref 0.3–1.2)
Total Protein: 6.4 g/dL — ABNORMAL LOW (ref 6.5–8.1)

## 2022-04-12 LAB — CBC WITH DIFFERENTIAL/PLATELET
Abs Immature Granulocytes: 0.04 10*3/uL (ref 0.00–0.07)
Basophils Absolute: 0 10*3/uL (ref 0.0–0.1)
Basophils Relative: 0 %
Eosinophils Absolute: 0 10*3/uL (ref 0.0–0.5)
Eosinophils Relative: 0 %
HCT: 36.1 % — ABNORMAL LOW (ref 39.0–52.0)
Hemoglobin: 12.3 g/dL — ABNORMAL LOW (ref 13.0–17.0)
Immature Granulocytes: 1 %
Lymphocytes Relative: 8 %
Lymphs Abs: 0.6 10*3/uL — ABNORMAL LOW (ref 0.7–4.0)
MCH: 29 pg (ref 26.0–34.0)
MCHC: 34.1 g/dL (ref 30.0–36.0)
MCV: 85.1 fL (ref 80.0–100.0)
Monocytes Absolute: 0.9 10*3/uL (ref 0.1–1.0)
Monocytes Relative: 12 %
Neutro Abs: 6 10*3/uL (ref 1.7–7.7)
Neutrophils Relative %: 79 %
Platelets: 122 10*3/uL — ABNORMAL LOW (ref 150–400)
RBC: 4.24 MIL/uL (ref 4.22–5.81)
RDW: 13.2 % (ref 11.5–15.5)
WBC: 7.6 10*3/uL (ref 4.0–10.5)
nRBC: 0 % (ref 0.0–0.2)

## 2022-04-12 LAB — BASIC METABOLIC PANEL
Anion gap: 8 (ref 5–15)
BUN: 16 mg/dL (ref 8–23)
CO2: 22 mmol/L (ref 22–32)
Calcium: 8.5 mg/dL — ABNORMAL LOW (ref 8.9–10.3)
Chloride: 100 mmol/L (ref 98–111)
Creatinine, Ser: 1.19 mg/dL (ref 0.61–1.24)
GFR, Estimated: 59 mL/min — ABNORMAL LOW (ref >60)
Glucose, Bld: 110 mg/dL — ABNORMAL HIGH (ref 70–99)
Potassium: 3.6 mmol/L (ref 3.5–5.1)
Sodium: 130 mmol/L — ABNORMAL LOW (ref 135–145)

## 2022-04-12 LAB — RESP PANEL BY RT-PCR (RSV, FLU A&B, COVID)  RVPGX2
Influenza A by PCR: NEGATIVE
Influenza B by PCR: NEGATIVE
Resp Syncytial Virus by PCR: NEGATIVE
SARS Coronavirus 2 by RT PCR: NEGATIVE

## 2022-04-12 LAB — CK: Total CK: 474 U/L — ABNORMAL HIGH (ref 49–397)

## 2022-04-12 LAB — TROPONIN I (HIGH SENSITIVITY)
Troponin I (High Sensitivity): 12 ng/L (ref <18)
Troponin I (High Sensitivity): 24 ng/L — ABNORMAL HIGH (ref <18)

## 2022-04-12 LAB — AMMONIA: Ammonia: <10 umol/L (ref 9–35)

## 2022-04-12 LAB — MAGNESIUM: Magnesium: 1.7 mg/dL (ref 1.7–2.4)

## 2022-04-12 LAB — LACTIC ACID, PLASMA: Lactic Acid, Venous: 0.8 mmol/L (ref 0.5–1.9)

## 2022-04-12 LAB — PROTIME-INR
INR: 1.4 — ABNORMAL HIGH (ref 0.8–1.2)
Prothrombin Time: 16.7 seconds — ABNORMAL HIGH (ref 11.4–15.2)

## 2022-04-12 MED ORDER — ALPRAZOLAM 0.25 MG PO TABS: 0.2500 mg | ORAL_TABLET | Freq: Every evening | ORAL | Status: DC | PRN

## 2022-04-12 MED ORDER — ACETAMINOPHEN 650 MG RE SUPP: 650.0000 mg | Freq: Four times a day (QID) | RECTAL | Status: DC | PRN

## 2022-04-12 MED ORDER — LORATADINE 10 MG PO TABS
10.0000 mg | ORAL_TABLET | Freq: Every evening | ORAL | Status: DC
  Administered 2022-04-12: 10 mg via ORAL
  Filled 2022-04-12: qty 1

## 2022-04-12 MED ORDER — SODIUM CHLORIDE 0.9 % IV SOLN
2.0000 g | INTRAVENOUS | Status: DC
  Administered 2022-04-12 – 2022-04-13 (×2): 2 g via INTRAVENOUS
  Filled 2022-04-12 (×2): qty 20

## 2022-04-12 MED ORDER — VANCOMYCIN HCL IN DEXTROSE 1-5 GM/200ML-% IV SOLN: 1000.0000 mg | INTRAVENOUS | Status: DC

## 2022-04-12 MED ORDER — AZELASTINE HCL 0.1 % NA SOLN
1.0000 | Freq: Two times a day (BID) | NASAL | Status: DC
  Administered 2022-04-13: 1 via NASAL
  Filled 2022-04-12: qty 30

## 2022-04-12 MED ORDER — APIXABAN 5 MG PO TABS
5.0000 mg | ORAL_TABLET | Freq: Two times a day (BID) | ORAL | Status: DC
  Administered 2022-04-12 – 2022-04-13 (×2): 5 mg via ORAL
  Filled 2022-04-12 (×2): qty 1

## 2022-04-12 MED ORDER — DILTIAZEM HCL 60 MG PO TABS
60.0000 mg | ORAL_TABLET | Freq: Three times a day (TID) | ORAL | Status: DC
  Administered 2022-04-12 – 2022-04-13 (×4): 60 mg via ORAL
  Filled 2022-04-12 (×5): qty 1

## 2022-04-12 MED ORDER — SODIUM CHLORIDE 0.9 % IV SOLN
2.0000 g | Freq: Two times a day (BID) | INTRAVENOUS | Status: DC
  Administered 2022-04-12: 2 g via INTRAVENOUS
  Filled 2022-04-12: qty 12.5

## 2022-04-12 MED ORDER — MOMETASONE FURO-FORMOTEROL FUM 200-5 MCG/ACT IN AERO: 2.0000 | INHALATION_SPRAY | Freq: Two times a day (BID) | RESPIRATORY_TRACT | Status: DC

## 2022-04-12 MED ORDER — ACETAMINOPHEN 325 MG PO TABS: 650.0000 mg | ORAL_TABLET | Freq: Four times a day (QID) | ORAL | Status: DC | PRN

## 2022-04-12 MED ORDER — MOMETASONE FURO-FORMOTEROL FUM 200-5 MCG/ACT IN AERO
2.0000 | INHALATION_SPRAY | Freq: Two times a day (BID) | RESPIRATORY_TRACT | Status: DC
  Administered 2022-04-13: 2 via RESPIRATORY_TRACT
  Filled 2022-04-12: qty 8.8

## 2022-04-12 MED ORDER — SODIUM CHLORIDE 0.9 % IV SOLN
500.0000 mg | INTRAVENOUS | Status: DC
  Administered 2022-04-12: 500 mg via INTRAVENOUS
  Filled 2022-04-12: qty 5

## 2022-04-12 MED ORDER — PANTOPRAZOLE SODIUM 40 MG PO TBEC: 40.0000 mg | DELAYED_RELEASE_TABLET | Freq: Every day | ORAL | Status: DC

## 2022-04-12 MED ORDER — ONDANSETRON HCL 4 MG PO TABS: 4.0000 mg | ORAL_TABLET | Freq: Four times a day (QID) | ORAL | Status: DC | PRN

## 2022-04-12 MED ORDER — LEVOTHYROXINE SODIUM 75 MCG PO TABS
75.0000 ug | ORAL_TABLET | Freq: Every day | ORAL | Status: DC
  Administered 2022-04-13: 75 ug via ORAL
  Filled 2022-04-12: qty 1

## 2022-04-12 MED ORDER — ONDANSETRON HCL 4 MG/2ML IJ SOLN: 4.0000 mg | Freq: Four times a day (QID) | INTRAMUSCULAR | Status: DC | PRN

## 2022-04-12 MED ORDER — ACETAMINOPHEN 650 MG RE SUPP
650.0000 mg | RECTAL | Status: DC | PRN
  Administered 2022-04-12: 650 mg via RECTAL
  Filled 2022-04-12: qty 1

## 2022-04-12 MED ORDER — POLYVINYL ALCOHOL 1.4 % OP SOLN
Freq: Every morning | OPHTHALMIC | Status: DC
  Administered 2022-04-13: 1 [drp] via OPHTHALMIC
  Filled 2022-04-12: qty 15

## 2022-04-12 MED ORDER — ALBUTEROL SULFATE HFA 108 (90 BASE) MCG/ACT IN AERS: 2.0000 | INHALATION_SPRAY | RESPIRATORY_TRACT | Status: DC | PRN

## 2022-04-12 MED ORDER — KETOROLAC TROMETHAMINE 0.5 % OP SOLN
1.0000 [drp] | Freq: Three times a day (TID) | OPHTHALMIC | Status: DC
  Filled 2022-04-12 (×2): qty 5

## 2022-04-12 MED ORDER — ROSUVASTATIN CALCIUM 5 MG PO TABS
5.0000 mg | ORAL_TABLET | Freq: Every day | ORAL | Status: DC
  Administered 2022-04-13: 5 mg via ORAL
  Filled 2022-04-12: qty 1

## 2022-04-12 MED ORDER — VANCOMYCIN HCL 1500 MG/300ML IV SOLN
1500.0000 mg | Freq: Once | INTRAVENOUS | Status: AC
  Administered 2022-04-12: 1500 mg via INTRAVENOUS
  Filled 2022-04-12: qty 300

## 2022-04-12 MED ORDER — MONTELUKAST SODIUM 10 MG PO TABS
10.0000 mg | ORAL_TABLET | Freq: Every day | ORAL | Status: DC
  Administered 2022-04-12: 10 mg via ORAL
  Filled 2022-04-12: qty 1

## 2022-04-12 MED ORDER — FERROUS SULFATE 325 (65 FE) MG PO TABS
325.0000 mg | ORAL_TABLET | Freq: Every day | ORAL | Status: DC
  Administered 2022-04-13: 325 mg via ORAL
  Filled 2022-04-12: qty 1

## 2022-04-12 NOTE — H&P (Addendum)
History and Physical    Chad Morrison [id number] DOB: [birth year] DOA: 04/12/2022  PCP: Haywood Pao, MD (Confirm with patient/family/NH records and if not entered, this has to be entered at [facility] point of entry) Patient coming from: Home  I have personally briefly reviewed patient's old medical records in [place]  Chief Complaint: SOB, fever  HPI: Chad Morrison is a 89 y.o. male with medical history significant of moderate asthma, COPD Gold stage II, A-fib on Eliquis, HTN, severe aortic stenosis, brought in for new onset of fever shortness of breath and left leg pain.  Patient started to have URI-like symptoms yesterday with severe runny nose congestion and overnight family heard patient started to have frequent clearing throat and dry cough.  But no fever, denies any chest pain, no abdominal pain no urinary symptoms no diarrhea.  This morning, patient developed cramping-like muscle pain of left thigh, denies any injury no fall, when he was trying to stand up from the toilet, he felt severe pain of the left thigh and slumped to the left side of the wall and could not get up himself.  Denies any LOC, no head injury.  No history of aspiration pneumonia.  ED Course: Rectal temperature 103.4, tachypneic breathing rate 25-30, O2 saturation 94% on room air, blood pressure 140 SBP, chest x-ray showed signs of interstitial pneumonia.  WBC 7.6, creatinine 1.1 bicarb 22. LA 0.8  Patient started on vancomycin and cefepime in the ED. Review of Systems: As per HPI otherwise 14 point review of systems negative.    Past Medical History:  Diagnosis Date   Anxiety    Asthma    Asthmatic bronchitis    Atrial flutter ([place])    chads2vasc score is 3   Ehrlichiosis [id number]   GERD (gastroesophageal reflux disease)    Granulomatosis with polyangiitis (HCC)    HTN (hypertension)    Hyperlipidemia    Nuclear sclerotic cataract of left eye 06/04/2019   Cataract surgery completed March 2023, Dr. [name]    Paroxysmal atrial fibrillation [facility])    Prostate cancer [facility])    Seasonal allergies     Past Surgical History:  Procedure Laterality Date   BASAL CELL CARCINOMA EXCISION  2013   EYELID   HERNIA REPAIR Bilateral 2000, 2002   KNEE ARTHROSCOPY  2000   prostectomy  [place]     reports that he quit smoking about 61 years ago. His smoking use included cigarettes. He has a 2.00 pack-year smoking history. He has never used smokeless tobacco. He reports current alcohol use of about 2.0 - 3.0 standard drinks of alcohol per week. He reports that he does not use drugs.  Allergies  Allergen Reactions   Other Other (See Comments)    Latex Gloves   Penicillins Hives    Has patient had a PCN reaction causing immediate rash, facial/tongue/throat swelling, SOB or lightheadedness with hypotension: No Has patient had a PCN reaction causing severe rash involving mucus membranes or skin necrosis: No Has patient had a PCN reaction that required hospitalization: No Has patient had a PCN reaction occurring within the last 10 years: Yes If all of the above answers are "NO", then may proceed with Cephalosporin use.    Sulfa Antibiotics Other (See Comments)    Unknown to patient    Sulfamethoxazole-Trimethoprim Other (See Comments)   Latex Hives and Rash    Family History  Problem Relation Age of Onset   Asthma Mother  86   Allergies Mother    Ulcers Mother    CVA Father 40   Heart Problems Father    Other Father        NEUROLOGIC ISSUES    Other Brother 2       BICYCLE ACCIDENT   Migraines Son    Diabetes Mellitus I Son     Prior to Admission medications   Medication Sig Start Date End Date Taking? Authorizing Provider  albuterol (VENTOLIN HFA) 108 (90 Base) MCG/ACT inhaler Inhale 2 puffs into the lungs every 4 (four) hours as needed for wheezing or shortness of breath. 12/21/21  Yes Kozlow, Donnamarie Poag, MD  ALPRAZolam Duanne Moron) 0.25 MG tablet Take 0.25 mg by mouth  at bedtime as needed for sleep.    Yes [provider]  Ascorbic Acid (VITAMIN C PO) Take 1 tablet by mouth every morning. Not sure the dosage   Yes [provider]  azelastine (ASTELIN) 0.1 % nasal spray Place 1 spray into both nostrils 2 (two) times daily. 03/31/21  Yes [provider]  Benralizumab (FASENRA Pettis) Inject into the skin every 8 (eight) weeks.   Yes [provider]  budesonide-formoterol (SYMBICORT) 160-4.5 MCG/ACT inhaler Inhale 2 puffs into the lungs 2 (two) times daily. 03/16/22  Yes Kozlow, Donnamarie Poag, MD  Calcium Carbonate-Vitamin D (CALCIUM-VITAMIN D) 600-125 MG-UNIT TABS Take 1 tablet by mouth 2 (two) times daily.    Yes [provider]  diltiazem (CARDIZEM CD) 180 MG 24 hr capsule TAKE 1 CAPSULE EVERY       MORNING AND AT BEDTIME Patient taking differently: Take 180 mg by mouth 2 (two) times daily. 12/06/21  Yes Fenton, Clint R, PA  ELIQUIS 5 MG TABS tablet TAKE 1 TABLET TWICE A DAY Patient taking differently: Take 5 mg by mouth 2 (two) times daily. 02/23/21  Yes Fenton, Clint R, PA  EPINEPHrine 0.3 mg/0.3 mL IJ SOAJ injection SMARTSIG:0.3 Milligram(s) IM Once Patient taking differently: Inject 0.3 mg into the muscle as needed for anaphylaxis. SMARTSIG:0.3 Milligram(s) IM Once 06/22/21  Yes Kozlow, Donnamarie Poag, MD  Ferrous Sulfate (SLOW FE PO) Take 1 tablet by mouth every morning.   Yes [provider]  ketorolac (ACULAR) 0.5 % ophthalmic solution Place 1 drop into the right eye 3 (three) times daily. 10/28/21 10/28/22 Yes Rankin, Clent Demark, MD  levocetirizine (XYZAL) 5 MG tablet TAKE 1 TABLET EVERY EVENING 02/25/22  Yes Kozlow, Donnamarie Poag, MD  levothyroxine (SYNTHROID, LEVOTHROID) 75 MCG tablet Take 75 mcg by mouth daily before breakfast.   Yes [provider]  montelukast (SINGULAIR) 10 MG tablet Take 1 tablet (10 mg total) by mouth at bedtime. 12/21/21  Yes Kozlow, Donnamarie Poag, MD  Multiple Vitamin (MULTIVITAMIN WITH MINERALS) TABS tablet  Take 1 tablet daily by mouth.   Yes [provider]  Multiple Vitamins-Minerals (OCUVITE PRESERVISION PO) Take 1 tablet 2 (two) times daily by mouth.   Yes [provider]  Polyethyl Glycol-Propyl Glycol (SYSTANE OP) Place 1 drop into both eyes every morning.   Yes [provider]  rosuvastatin (CRESTOR) 5 MG tablet TAKE 1 TABLET DAILY 12/06/21  Yes Early Osmond, MD  brimonidine (ALPHAGAN) 0.15 % ophthalmic solution INSTILL 1 DROP IN RIGHT EYE EVERY 8 HOURS Patient not taking: Reported on 04/12/2022 12/20/21   Rankin, Clent Demark, MD  budesonide-formoterol [facility]) 160-4.5 MCG/ACT inhaler Inhale 2 puffs into the lungs in the morning and at bedtime. 03/21/22   Kozlow, Donnamarie Poag, MD  esomeprazole (  NEXIUM) 40 MG capsule Take 1 capsule (40 mg total) by mouth 2 (two) times daily. Patient not taking: Reported on 04/12/2022 12/21/21   Jiles Prows, MD  loteprednol (LOTEMAX) 0.5 % ophthalmic suspension Place 1 drop into the right eye daily. Patient not taking: Reported on 04/12/2022 09/30/21 09/30/22  Hurman Horn, MD    Physical Exam: Vitals:   04/12/22 1258 04/12/22 1259 04/12/22 1340  BP: (!) 140/78    Pulse: 87    Resp: 18    Temp: 98.9 F (37.2 C)  (!) 103.4 F (39.7 C)  TempSrc: Oral  Rectal  SpO2: 98%    Weight:  70.3 kg   Height:  5' 9"$  (1.753 m)     Constitutional: NAD, calm, comfortable Vitals:   04/12/22 1258 04/12/22 1259 04/12/22 1340  BP: (!) 140/78    Pulse: 87    Resp: 18    Temp: 98.9 F (37.2 C)  (!) 103.4 F (39.7 C)  TempSrc: Oral  Rectal  SpO2: 98%    Weight:  70.3 kg   Height:  5' 9"$  (1.753 m)    Eyes: PERRL, lids and conjunctivae normal ENMT: Mucous membranes are moist. Posterior pharynx clear of any exudate or lesions.Normal dentition.  Neck: normal, supple, no masses, no thyromegaly Respiratory: clear to auscultation bilaterally, no wheezing, bilateral lower field fine crackles.  Increasing respiratory effort. No accessory muscle use.   Cardiovascular: Irregular heartbeat, systolic murmur and diminished S2. No extremity edema. 2+ pedal pulses. No carotid bruits.  Abdomen: no tenderness, no masses palpated. No hepatosplenomegaly. Bowel sounds positive.  Musculoskeletal: no clubbing / cyanosis. No joint deformity upper and lower extremities. Good ROM, no contractures. Normal muscle tone. Tenderness on left lateral aspect of mid thigh, hip and knee movement intact Skin: no rashes, lesions, ulcers. No induration Neurologic: CN 2-12 grossly intact. Sensation intact, DTR normal. Strength 5/5 in all 4.  Psychiatric: Normal judgment and insight. Alert and oriented x 3. Normal mood.     Labs on Admission: I have personally reviewed following labs and imaging studies  CBC: Recent Labs  Lab 04/12/22 1303  WBC 7.6  NEUTROABS 6.0  HGB 12.3*  HCT 36.1*  MCV 85.1  PLT 123XX123*   Basic Metabolic Panel: Recent Labs  Lab 04/12/22 1303  NA 130*  K 3.6  CL 100  CO2 22  GLUCOSE 110*  BUN 16  CREATININE 1.19  CALCIUM 8.5*  MG 1.7   GFR: Estimated Creatinine Clearance: 43.5 mL/min (by C-G formula based on SCr of 1.19 mg/dL). Liver Function Tests: Recent Labs  Lab 04/12/22 1303  AST 28  ALT 22  ALKPHOS 60  BILITOT 0.6  PROT 6.4*  ALBUMIN 3.4*   No results for input(s): "LIPASE", "AMYLASE" in the last 168 hours. Recent Labs  Lab 04/12/22 1328  AMMONIA <10   Coagulation Profile: Recent Labs  Lab 04/12/22 1303  INR 1.4*   Cardiac Enzymes: No results for input(s): "CKTOTAL", "CKMB", "CKMBINDEX", "TROPONINI" in the last 168 hours. BNP (last 3 results) No results for input(s): "PROBNP" in the last 8760 hours. HbA1C: No results for input(s): "HGBA1C" in the last 72 hours. CBG: Recent Labs  Lab 04/12/22 1348  GLUCAP 97   Lipid Profile: No results for input(s): "CHOL", "HDL", "LDLCALC", "TRIG", "CHOLHDL", "LDLDIRECT" in the last 72 hours. Thyroid Function Tests: No results for input(s): "TSH", "T4TOTAL",  "FREET4", "T3FREE", "THYROIDAB" in the last 72 hours. Anemia Panel: No results for input(s): "VITAMINB12", "FOLATE", "FERRITIN", "TIBC", "IRON", "RETICCTPCT" in  the last 72 hours. Urine analysis: No results found for: "COLORURINE", "APPEARANCEUR", "LABSPEC", "PHURINE", "GLUCOSEU", "HGBUR", "BILIRUBINUR", "KETONESUR", "PROTEINUR", "UROBILINOGEN", "NITRITE", "LEUKOCYTESUR"  Radiological Exams on Admission: DG Chest Port 1 View  Result Date: 04/12/2022 CLINICAL DATA:  Fever EXAM: PORTABLE CHEST 1 VIEW COMPARISON:  03/06/2020 FINDINGS: Cardiomegaly. Diffuse bilateral interstitial pulmonary opacity. The visualized skeletal structures are unremarkable. IMPRESSION: Cardiomegaly with diffuse bilateral interstitial pulmonary opacity, consistent with edema or atypical/viral infection. No focal airspace opacity. Electronically Signed   By: Delanna Ahmadi M.D.   On: 04/12/2022 13:56   ECHOCARDIOGRAM COMPLETE  Result Date: 04/11/2022    ECHOCARDIOGRAM REPORT   Patient Name:   BRICK SODERBERG   Date of Exam: 04/11/2022 Medical Rec #:  [id number]     Height:       69.0 in Accession #:    [id number]    Weight:       150.2 lb Date of Birth:  1932/08/04      BSA:          1.829 m Patient Age:    63 years      BP:           140/74 mmHg Patient Gender: M             HR:           85 bpm. Exam Location:  [address] Procedure: 2D Echo, Cardiac Doppler and Color Doppler Indications:    I35.0 AS  History:        Patient has prior history of Echocardiogram examinations, most                 recent 07/08/2021. AS, Arrythmias:Atrial Fibrillation; Risk                 Factors:Hypertension, Dyslipidemia and Former Smoker.  Sonographer:    Coralyn Helling RDCS Referring Phys: [id number] [place]  1. Left ventricular ejection fraction, by estimation, is 55 to 60%. The left ventricle has normal function. The left ventricle has no regional wall motion abnormalities. There is mild concentric left ventricular hypertrophy. Left  ventricular diastolic parameters are indeterminate.  2. Right ventricular systolic function is normal. The right ventricular size is normal. Tricuspid regurgitation signal is inadequate for assessing PA pressure.  3. Left atrial size was moderately dilated.  4. Right atrial size was moderately dilated.  5. The mitral valve is normal in structure. Mild mitral valve regurgitation. No evidence of mitral stenosis.  6. The aortic valve is tricuspid. There is severe calcifcation of the aortic valve. Aortic valve regurgitation is trivial. Severe aortic valve stenosis. Aortic valve area, by VTI measures 0.68 cm. Aortic valve mean gradient measures 42.5 mmHg.  7. Aortic dilatation noted. There is mild dilatation of the ascending aorta, measuring 39 mm.  8. The inferior vena cava is normal in size with greater than 50% respiratory variability, suggesting right atrial pressure of 3 mmHg.  9. The patient was in atrial fibrillation. FINDINGS  Left Ventricle: Left ventricular ejection fraction, by estimation, is 55 to 60%. The left ventricle has normal function. The left ventricle has no regional wall motion abnormalities. The left ventricular internal cavity size was normal in size. There is  mild concentric left ventricular hypertrophy. Left ventricular diastolic parameters are indeterminate. Right Ventricle: The right ventricular size is normal. No increase in right ventricular wall thickness. Right ventricular systolic function is normal. Tricuspid regurgitation signal is inadequate for assessing PA pressure. The tricuspid regurgitant velocity is 2.87 m/s, and  with an assumed right atrial pressure of 3 mmHg, the estimated right ventricular systolic pressure is [id number]. Left Atrium: Left atrial size was moderately dilated. Right Atrium: Right atrial size was moderately dilated. Pericardium: There is no evidence of pericardial effusion. Mitral Valve: The mitral valve is normal in structure. Mild mitral annular calcification.  Mild mitral valve regurgitation. No evidence of mitral valve stenosis. Tricuspid Valve: The tricuspid valve is normal in structure. Tricuspid valve regurgitation is mild. Aortic Valve: The aortic valve is tricuspid. There is severe calcifcation of the aortic valve. Aortic valve regurgitation is trivial. Severe aortic stenosis is present. Aortic valve mean gradient measures 42.5 mmHg. Aortic valve peak gradient measures 70.0 mmHg. Aortic valve area, by VTI measures 0.68 cm. Pulmonic Valve: The pulmonic valve was normal in structure. Pulmonic valve regurgitation is trivial. Aorta: The ascending aorta was not well visualized and aortic dilatation noted. There is mild dilatation of the ascending aorta, measuring 39 mm. Venous: The inferior vena cava is normal in size with greater than 50% respiratory variability, suggesting right atrial pressure of 3 mmHg. IAS/Shunts: No atrial level shunt detected by color flow Doppler.  LEFT VENTRICLE PLAX 2D LVIDd:         4.30 cm LVIDs:         2.70 cm LV PW:         1.20 cm LV IVS:        1.50 cm LVOT diam:     2.20 cm LV SV:         55 LV SV Index:   30 LVOT Area:     3.80 cm  RIGHT VENTRICLE            IVC RVSP:           35.9 mmHg  IVC diam: 0.90 cm LEFT ATRIUM             Index        RIGHT ATRIUM           Index LA diam:        4.80 cm 2.62 cm/m   RA Pressure: 3.00 mmHg LA Vol (A2C):   81.1 ml 44.33 ml/m  RA Area:     29.70 cm LA Vol (A4C):   80.4 ml 43.95 ml/m  RA Volume:   107.00 ml 58.49 ml/m LA Biplane Vol: 85.2 ml 46.58 ml/m  AORTIC VALVE AV Area (Vmax):    0.64 cm AV Area (Vmean):   0.63 cm AV Area (VTI):     0.68 cm AV Vmax:           418.40 cm/s AV Vmean:          280.000 cm/s AV VTI:            0.799 m AV Peak Grad:      70.0 mmHg AV Mean Grad:      42.5 mmHg LVOT Vmax:         70.94 cm/s LVOT Vmean:        46.460 cm/s LVOT VTI:          0.144 m LVOT/AV VTI ratio: 0.18  AORTA Ao Root diam: 3.70 cm Ao Asc diam:  3.90 cm MITRAL VALVE               TRICUSPID  VALVE MV Area (PHT): 4.91 cm    TR Peak grad:   32.9 mmHg MV Decel Time: 155 msec    TR Vmax:  287.00 cm/s MV E velocity: 96.00 cm/s  Estimated RAP:  3.00 mmHg                            RVSP:           35.9 mmHg                             SHUNTS                            Systemic VTI:  0.14 m                            Systemic Diam: 2.20 cm Dalton McleanMD Electronically signed by Franki Monte Signature Date/Time: 04/11/2022/1:37:52 PM    Final     EKG: Independently reviewed.  Rate controlled A-fib, chronic RBBB, no acute ST changes.  Assessment/Plan Principal Problem:   PNA (pneumonia) Active Problems:   COPD (chronic obstructive pulmonary disease) (HCC)   Atrial fibrillation (HCC)   Asthma   CAP (community acquired pneumonia)  (please populate well all problems here in Problem List. (For example, if patient is on BP meds at home and you resume or decide to hold them, it is a problem that needs to be her. Same for CAD, COPD, HLD and so on)  Sepsis -Evidenced by new onset of fever, tachypnea, suspected source is multifocal pneumonia versus atypical pneumonia -Suspect also aspiration from copious postnasal drip close pneumonia.  Switch antibiotics to cover CAP with ceftriaxone and azithromycin.  CT chest without contrast for further characterize -Send atypical study including Legionella and mycoplasma and sputum culture -Incentive spirometry -Other Ddx, no urine symptoms and UA sent  Multifocal pneumonia, CURB65=1, mild -Atypical pneumonia versus aspiration from postnasal drip  Asthma/COPD -No symptoms signs of acute exacerbation, fever and tachypnea related to atypical pneumonia/multifocal pneumonia management as above -Continue DuoNeb, as needed albuterol, ICS and LABA  Left thigh pain -Appears to be muscular, check left femoral x-ray and CK -PT evaluation  Severe AS -Outpatient echo showed severe AS -Scheduled for cardiology follow-up for TAVR evaluation -Currently,  patient appears to be euvolemic, denies any angina syncope or CHF edema.  PAF -Rate controlled A-fib, continue Cardizem and Eliquis  Remote history of Wegener's granuloma -In remission, off medication for years.   DVT prophylaxis: Eliquis Code Status: Full code Family Communication: Wife at bedside Disposition Plan: Expect more than 2 midnight hospital stay as patient is septic requiring IV ABX and close monitoring Consults called: None Admission status: Tele admit   Lequita Halt MD [facility] Pager [phone]  04/12/2022, 3:54 PM

## 2022-04-12 NOTE — ED Provider Notes (Signed)
[facility] Provider Note   CSN: [id number] Arrival date & time: 04/12/22  1249     History  Chief Complaint  Patient presents with   Altered Mental Status   Extremity Weakness    legs    Chad Morrison is a 89 y.o. male presents emergency department with a chief complaint of altered mental status.  History is given by the patient's wife at bedside.  Reports that the patient has been complaining of left leg pain for some time.  Yesterday he seemed more lethargic than normal and had increased pain in his left leg but was also complaining of pain in both legs.  She states that they were getting ready to go to [facility] urgent care for evaluation and he was sitting on the toilet when he suddenly slumped over.  She was unable to move him from the toilet.  He was very confused.  She called 911 who brought him in.  Patient is unable to provide any history at this time.   Altered Mental Status Extremity Weakness       Home Medications Prior to Admission medications   Medication Sig Start Date End Date Taking? Authorizing Provider  albuterol (VENTOLIN HFA) 108 (90 Base) MCG/ACT inhaler Inhale 2 puffs into the lungs every 4 (four) hours as needed for wheezing or shortness of breath. 12/21/21   Kozlow, Donnamarie Poag, MD  ALPRAZolam Duanne Moron) 0.25 MG tablet Take 0.25 mg by mouth at bedtime as needed for sleep.     [provider]  Ascorbic Acid (VITAMIN C PO) Take 1 tablet by mouth every morning. Not sure the dosage    [provider]  azelastine (ASTELIN) 0.1 % nasal spray Place 1 spray into both nostrils 2 (two) times daily. 03/31/21   [provider]  Benralizumab [facility]) Inject into the skin every 8 (eight) weeks.    [provider]  brimonidine (ALPHAGAN) 0.15 % ophthalmic solution INSTILL 1 DROP IN RIGHT EYE EVERY 8 HOURS 12/20/21   Rankin, Clent Demark, MD  budesonide-formoterol (SYMBICORT) 160-4.5 MCG/ACT inhaler Inhale 2  puffs into the lungs 2 (two) times daily. 03/16/22   Kozlow, Donnamarie Poag, MD  budesonide-formoterol (SYMBICORT) 160-4.5 MCG/ACT inhaler Inhale 2 puffs into the lungs in the morning and at bedtime. 03/21/22   Kozlow, Donnamarie Poag, MD  Calcium Carbonate-Vitamin D (CALCIUM-VITAMIN D) 600-125 MG-UNIT TABS Take 1 tablet by mouth 2 (two) times daily.     [provider]  diltiazem (CARDIZEM CD) 180 MG 24 hr capsule TAKE 1 CAPSULE EVERY       MORNING AND AT BEDTIME 12/06/21   Fenton, Clint R, PA  ELIQUIS 5 MG TABS tablet TAKE 1 TABLET TWICE A DAY 02/23/21   Fenton, Clint R, PA  EPINEPHrine 0.3 mg/0.3 mL IJ SOAJ injection SMARTSIG:0.3 Milligram(s) IM Once 06/22/21   Kozlow, Donnamarie Poag, MD  esomeprazole (NEXIUM) 40 MG capsule Take 1 capsule (40 mg total) by mouth 2 (two) times daily. 12/21/21   Kozlow, Donnamarie Poag, MD  Ferrous Sulfate (SLOW FE PO) Take 1 tablet by mouth every morning.    [provider]  fluorouracil (EFUDEX) 5 % cream SMARTSIG:sparingly Topical Twice Daily 05/18/21   [provider]  ketorolac (ACULAR) 0.5 % ophthalmic solution Place 1 drop into the right eye 3 (three) times daily. 10/28/21 10/28/22  Rankin, Clent Demark, MD  levocetirizine (XYZAL) 5 MG tablet TAKE 1 TABLET EVERY EVENING 02/25/22   Kozlow, Donnamarie Poag, MD  levothyroxine (SYNTHROID, LEVOTHROID) 75 MCG tablet Take 75 mcg by mouth daily before breakfast.    [provider]  loteprednol (LOTEMAX) 0.5 % ophthalmic suspension Place 1 drop into the right eye daily. 09/30/21 09/30/22  Rankin, Clent Demark, MD  montelukast (SINGULAIR) 10 MG tablet Take 1 tablet (10 mg total) by mouth at bedtime. 12/21/21   Kozlow, Donnamarie Poag, MD  Multiple Vitamin (MULTIVITAMIN WITH MINERALS) TABS tablet Take 1 tablet daily by mouth.    [provider]  Multiple Vitamins-Minerals (OCUVITE PRESERVISION PO) Take 1 tablet 2 (two) times daily by mouth.    [provider]  Polyethyl Glycol-Propyl Glycol (SYSTANE OP) Apply 1 drop to eye every  morning. Patient not taking: Reported on 10/22/2021    [provider]  Propylene Glycol 0.6 % SOLN Apply 1 drop to eye in the morning.    [provider]  rosuvastatin (CRESTOR) 5 MG tablet TAKE 1 TABLET DAILY 12/06/21   Early Osmond, MD      Allergies    Other, Penicillins, Sulfa antibiotics, Sulfamethoxazole-trimethoprim, and Latex    Review of Systems   Review of Systems  Musculoskeletal:  Positive for extremity weakness.    Physical Exam Updated Vital Signs BP (!) 140/78 (BP Location: Right Arm)   Pulse 87   Temp 98.9 F (37.2 C) (Oral)   Resp 18   Ht 5' 9"$  (1.753 m)   Wt 70.3 kg   SpO2 98%   BMI 22.89 kg/m  Physical Exam Vitals and nursing note reviewed.  Constitutional:      Appearance: He is well-developed. He is ill-appearing and toxic-appearing. He is not diaphoretic.  HENT:     Head: Normocephalic and atraumatic.     Mouth/Throat:     Mouth: Mucous membranes are dry.  Eyes:     General: No scleral icterus.    Conjunctiva/sclera: Conjunctivae normal.     Comments: Mildly icteric  Cardiovascular:     Rate and Rhythm: Tachycardia present. Rhythm regularly irregular.     Heart sounds: Murmur heard.  Pulmonary:     Effort: Pulmonary effort is normal. Tachypnea present.     Breath sounds: No wheezing.  Abdominal:     General: There is no distension.     Palpations: Abdomen is soft.     Tenderness: There is abdominal tenderness in the right upper quadrant.     Comments: Well-healing midline lower abdominal surgical scar  Musculoskeletal:     Cervical back: Normal range of motion and neck supple.     Right lower leg: No edema.     Left lower leg: No edema.     Comments: Pain in the left quadricep with flexion of the hip and knee  Skin:    General: Skin is warm.  Neurological:     Mental Status: He is lethargic.     GCS: GCS eye subscore is 3. GCS verbal subscore is 4. GCS motor subscore is 6.     Cranial Nerves: Cranial nerves 2-12 are  intact.     Motor: Motor function is intact.     Coordination: Coordination is intact.     Deep Tendon Reflexes: Reflexes are normal and symmetric.  Psychiatric:        Behavior: Behavior normal.     ED Results / Procedures / Treatments   Labs (all labs ordered are listed, but only abnormal results are displayed) Labs Reviewed  RESP PANEL BY RT-PCR (RSV, FLU A&B, COVID)  [id number]  BASIC METABOLIC  PANEL  CBC WITH DIFFERENTIAL/PLATELET  AMMONIA  LACTIC ACID, PLASMA  PROTIME-INR  MAGNESIUM  HEPATIC FUNCTION PANEL  CBG MONITORING, ED  TROPONIN I (HIGH SENSITIVITY)    EKG None  Radiology ECHOCARDIOGRAM COMPLETE  Result Date: 04/11/2022    ECHOCARDIOGRAM REPORT   Patient Name:   CAMARIE REGEN   Date of Exam: 04/11/2022 Medical Rec #:  [id number]     Height:       69.0 in Accession #:    [id number]    Weight:       150.2 lb Date of Birth:  10/17/1932      BSA:          1.829 m Patient Age:    23 years      BP:           140/74 mmHg Patient Gender: M             HR:           85 bpm. Exam Location:  [address] Procedure: 2D Echo, Cardiac Doppler and Color Doppler Indications:    I35.0 AS  History:        Patient has prior history of Echocardiogram examinations, most                 recent 07/08/2021. AS, Arrythmias:Atrial Fibrillation; Risk                 Factors:Hypertension, Dyslipidemia and Former Smoker.  Sonographer:    Coralyn Helling RDCS Referring Phys: [id number] Anna  1. Left ventricular ejection fraction, by estimation, is 55 to 60%. The left ventricle has normal function. The left ventricle has no regional wall motion abnormalities. There is mild concentric left ventricular hypertrophy. Left ventricular diastolic parameters are indeterminate.  2. Right ventricular systolic function is normal. The right ventricular size is normal. Tricuspid regurgitation signal is inadequate for assessing PA pressure.  3. Left atrial size was moderately dilated.  4. Right atrial size  was moderately dilated.  5. The mitral valve is normal in structure. Mild mitral valve regurgitation. No evidence of mitral stenosis.  6. The aortic valve is tricuspid. There is severe calcifcation of the aortic valve. Aortic valve regurgitation is trivial. Severe aortic valve stenosis. Aortic valve area, by VTI measures 0.68 cm. Aortic valve mean gradient measures 42.5 mmHg.  7. Aortic dilatation noted. There is mild dilatation of the ascending aorta, measuring 39 mm.  8. The inferior vena cava is normal in size with greater than 50% respiratory variability, suggesting right atrial pressure of 3 mmHg.  9. The patient was in atrial fibrillation. FINDINGS  Left Ventricle: Left ventricular ejection fraction, by estimation, is 55 to 60%. The left ventricle has normal function. The left ventricle has no regional wall motion abnormalities. The left ventricular internal cavity size was normal in size. There is  mild concentric left ventricular hypertrophy. Left ventricular diastolic parameters are indeterminate. Right Ventricle: The right ventricular size is normal. No increase in right ventricular wall thickness. Right ventricular systolic function is normal. Tricuspid regurgitation signal is inadequate for assessing PA pressure. The tricuspid regurgitant velocity is 2.87 m/s, and with an assumed right atrial pressure of 3 mmHg, the estimated right ventricular systolic pressure is [id number]. Left Atrium: Left atrial size was moderately dilated. Right Atrium: Right atrial size was moderately dilated. Pericardium: There is no evidence of pericardial effusion. Mitral Valve: The mitral valve is normal in structure. Mild mitral annular calcification. Mild mitral valve regurgitation. No  evidence of mitral valve stenosis. Tricuspid Valve: The tricuspid valve is normal in structure. Tricuspid valve regurgitation is mild. Aortic Valve: The aortic valve is tricuspid. There is severe calcifcation of the aortic valve. Aortic valve  regurgitation is trivial. Severe aortic stenosis is present. Aortic valve mean gradient measures 42.5 mmHg. Aortic valve peak gradient measures 70.0 mmHg. Aortic valve area, by VTI measures 0.68 cm. Pulmonic Valve: The pulmonic valve was normal in structure. Pulmonic valve regurgitation is trivial. Aorta: The ascending aorta was not well visualized and aortic dilatation noted. There is mild dilatation of the ascending aorta, measuring 39 mm. Venous: The inferior vena cava is normal in size with greater than 50% respiratory variability, suggesting right atrial pressure of 3 mmHg. IAS/Shunts: No atrial level shunt detected by color flow Doppler.  LEFT VENTRICLE PLAX 2D LVIDd:         4.30 cm LVIDs:         2.70 cm LV PW:         1.20 cm LV IVS:        1.50 cm LVOT diam:     2.20 cm LV SV:         55 LV SV Index:   30 LVOT Area:     3.80 cm  RIGHT VENTRICLE            IVC RVSP:           35.9 mmHg  IVC diam: 0.90 cm LEFT ATRIUM             Index        RIGHT ATRIUM           Index LA diam:        4.80 cm 2.62 cm/m   RA Pressure: 3.00 mmHg LA Vol (A2C):   81.1 ml 44.33 ml/m  RA Area:     29.70 cm LA Vol (A4C):   80.4 ml 43.95 ml/m  RA Volume:   107.00 ml 58.49 ml/m LA Biplane Vol: 85.2 ml 46.58 ml/m  AORTIC VALVE AV Area (Vmax):    0.64 cm AV Area (Vmean):   0.63 cm AV Area (VTI):     0.68 cm AV Vmax:           418.40 cm/s AV Vmean:          280.000 cm/s AV VTI:            0.799 m AV Peak Grad:      70.0 mmHg AV Mean Grad:      42.5 mmHg LVOT Vmax:         70.94 cm/s LVOT Vmean:        46.460 cm/s LVOT VTI:          0.144 m LVOT/AV VTI ratio: 0.18  AORTA Ao Root diam: 3.70 cm Ao Asc diam:  3.90 cm MITRAL VALVE               TRICUSPID VALVE MV Area (PHT): 4.91 cm    TR Peak grad:   32.9 mmHg MV Decel Time: 155 msec    TR Vmax:        287.00 cm/s MV E velocity: 96.00 cm/s  Estimated RAP:  3.00 mmHg                            RVSP:           35.9 mmHg  SHUNTS                             Systemic VTI:  0.14 m                            Systemic Diam: 2.20 cm Dalton McleanMD Electronically signed by Franki Monte Signature Date/Time: 04/11/2022/1:37:52 PM    Final     Procedures Procedures    Medications Ordered in ED Medications  acetaminophen (TYLENOL) suppository 650 mg (650 mg Rectal Given 04/12/22 1342)    ED Course/ Medical Decision Making/ A&P Clinical Course as of 04/12/22 1803  Tue Apr 12, 2022  1431 WBC: 7.6 [AH]  1431 Sodium(!): 130 [AH]  1431 Platelets(!): 122 [AH]  1431 Lymphocyte #(!): 0.6 [AH]  1432 ED EKG [AH]    Clinical Course User Index [AH] Margarita Mail, PA-C                             Medical Decision Making Patient presents with AMS. The differential diagnosis for AMS is extensive and includes, but is not limited to: drug overdose - opioids, alcohol, sedatives, antipsychotics, drug withdrawal, others; Metabolic: hypoxia, hypoglycemia, hyperglycemia, hypercalcemia, hypernatremia, hyponatremia, uremia, hepatic encephalopathy, hypothyroidism, hyperthyroidism, vitamin B12 or thiamine deficiency, carbon monoxide poisoning, Wilson's disease, Lactic acidosis, DKA/HHOS; Infectious: meningitis, encephalitis, bacteremia/sepsis, urinary tract infection, pneumonia, neurosyphilis; Structural: Space-occupying lesion, (brain tumor, subdural hematoma, hydrocephalus,); Vascular: stroke, subarachnoid hemorrhage, coronary ischemia, hypertensive encephalopathy, CNS vasculitis, thrombotic thrombocytopenic purpura, disseminated intravascular coagulation, hyperviscosity; Psychiatric: Schizophrenia, depression; Other: Seizure, hypothermia, heat stroke, ICU psychosis, dementia -"sundowning."  Review of all data points patient appears to have multifocal pneumonia.  He is not requiring oxygen.  Patient will be admitted by Dr. Roosevelt Locks    Amount and/or Complexity of Data Reviewed Labs: ordered. Decision-making details documented in ED Course.    Details: Labs reviewed  showed elevated troponin at 24, likely in the setting of A-fib mild demand ischemia.  CBG is within normal limits, respiratory panel negative for COVID influenza, ammonia within normal limits, normal lactic acid level.  BMP shows mild hyponatremia, CBC without elevated white blood cell count.  Mildly elevated PT/INR of insignificant value.  Radiology: ordered and independent interpretation performed.    Details: I ordered and interpreted a CT head which shows no acute findings.  I visualized and interpreted a 1 view chest x-ray which shows bilateral interstitial pulmonary opacities edema versus atypical pneumonia.  Given patient's fever of 103.4, generalized weakness and acute mental status change I suspect multifocal pneumonia as the source ECG/medicine tests: ordered and independent interpretation performed. Decision-making details documented in ED Course.    Details: EG shows rate controlled a A-fib at a rate of 86  Risk Prescription drug management. Decision regarding hospitalization.           Final Clinical Impression(s) / ED Diagnoses Final diagnoses:  Multifocal pneumonia  High fever  Altered mental status, unspecified altered mental status type    Rx / DC Orders ED Discharge Orders     None         Margarita Mail, PA-C 04/12/22 1820    Jeanell Sparrow, DO 04/16/22 1111

## 2022-04-12 NOTE — Progress Notes (Signed)
Pharmacy Antibiotic Note  Chad Morrison is a 89 y.o. male admitted on 04/12/2022 presenting with AMS, concern for sepsis.  Pharmacy has been consulted for vancomycin dosing.  Plan: Vancomycin 1500 mg IV x 1, then 1g IV q 24h (eAUC 485) Add MRSA PCR Monitor renal function, Cx/PCR to narrow Vancomycin levels as indicated  Height: 5' 9"$  (175.3 cm) Weight: 70.3 kg (155 lb) IBW/kg (Calculated) : 70.7  Temp (24hrs), Avg:98.9 F (37.2 C), Min:98.9 F (37.2 C), Max:98.9 F (37.2 C)  Recent Labs  Lab 04/12/22 1303 04/12/22 1328  WBC 7.6  --   CREATININE 1.19  --   LATICACIDVEN  --  0.8    Estimated Creatinine Clearance: 43.5 mL/min (by C-G formula based on SCr of 1.19 mg/dL).    Allergies  Allergen Reactions   Other Other (See Comments)    Latex Gloves   Penicillins Hives    Has patient had a PCN reaction causing immediate rash, facial/tongue/throat swelling, SOB or lightheadedness with hypotension: No Has patient had a PCN reaction causing severe rash involving mucus membranes or skin necrosis: No Has patient had a PCN reaction that required hospitalization: No Has patient had a PCN reaction occurring within the last 10 years: Yes If all of the above answers are "NO", then may proceed with Cephalosporin use.    Sulfa Antibiotics Other (See Comments)    Unknown to patient    Sulfamethoxazole-Trimethoprim Other (See Comments)   Latex Hives and Rash    Bertis Ruddy, PharmD, [facility] Pharmacist ED Pharmacist Phone # [phone] 04/12/2022 3:20 PM

## 2022-04-12 NOTE — Plan of Care (Signed)

## 2022-04-12 NOTE — ED Triage Notes (Signed)
Per EMS pt started having AMS 24 hours ago. Pt went to bathroom today pt had weakness in legs. Pt Aox3. Pt has had a stomach ache since Saturday.   VS EMS 130/70 93% RA 139 CBG

## 2022-04-12 NOTE — ED Notes (Signed)
Patient transported to X-ray 

## 2022-04-12 NOTE — Progress Notes (Signed)
CT chest reviewed, compatible with bilateral lower lungs multifocal pneumonia likely related to increasing postnasal drip.  Also has baseline bronchiectasis which is unheard of. Continue Ceftriaxone and Azithromycin

## 2022-04-12 NOTE — ED Notes (Signed)
Ed hand ED TO INPATIENT HANDOFF REPORT  ED Nurse Name and Phone #:  [phone]   S Name/Age/Gender Chad Morrison 89 y.o. male Room/Bed: 042C/042C  Code Status   Code Status: Full Code  Home/SNF/Other Home Patient oriented to: self, place, time, and situation Is this baseline? Yes   Triage Complete: Triage complete  Chief Complaint PNA (pneumonia) [J18.9]  Triage Note Per EMS pt started having AMS 24 hours ago. Pt went to bathroom today pt had weakness in legs. Pt Aox3. Pt has had a stomach ache since Saturday.   VS EMS 130/70 93% RA 139 CBG   Allergies Allergies  Allergen Reactions   Other Other (See Comments)    Latex Gloves   Penicillins Hives    Has patient had a PCN reaction causing immediate rash, facial/tongue/throat swelling, SOB or lightheadedness with hypotension: No Has patient had a PCN reaction causing severe rash involving mucus membranes or skin necrosis: No Has patient had a PCN reaction that required hospitalization: No Has patient had a PCN reaction occurring within the last 10 years: Yes If all of the above answers are "NO", then may proceed with Cephalosporin use.    Sulfa Antibiotics Other (See Comments)    Unknown to patient    Sulfamethoxazole-Trimethoprim Other (See Comments)   Latex Hives and Rash    Level of Care/Admitting Diagnosis ED Disposition     ED Disposition  Admit   Condition  --   [place]: Terminous [100100]  Level of Care: Telemetry Medical [104]  May admit patient to [facility] or [facility] if equivalent level of care is available:: No  Covid Evaluation: Asymptomatic - no recent exposure (last 10 days) testing not required  Diagnosis: PNA (pneumonia) [id number]  Admitting Physician: Lequita Halt [id number]  Attending Physician: Lequita Halt [id number]  Certification:: I certify this patient will need inpatient services for at least 2 midnights  Estimated Length of Stay: 2           B Medical/Surgery History Past Medical History:  Diagnosis Date   Anxiety    Asthma    Asthmatic bronchitis    Atrial flutter ([place])    chads2vasc score is 3   Ehrlichiosis [id number]   GERD (gastroesophageal reflux disease)    Granulomatosis with polyangiitis ([place])    HTN (hypertension)    Hyperlipidemia    Nuclear sclerotic cataract of left eye 06/04/2019   Cataract surgery completed March 2023, Dr. [name]   Paroxysmal atrial fibrillation [facility])    Prostate cancer ([place])    Seasonal allergies    Past Surgical History:  Procedure Laterality Date   BASAL CELL CARCINOMA EXCISION  2013   EYELID   HERNIA REPAIR Bilateral 2000, 2002   KNEE ARTHROSCOPY  2000   prostectomy  [place]     A IV Location/Drains/Wounds Patient Lines/Drains/Airways Status     Active Line/Drains/Airways     Name Placement date Placement time Site Days   Peripheral IV 04/12/22 18 G Anterior;Distal;Left;Upper Arm 04/12/22  1329  Arm  less than 1            Intake/Output Last 24 hours No intake or output data in the 24 hours ending 04/12/22 1824  Labs/Imaging Results for orders placed or performed during the hospital encounter of 04/12/22 (from the past 48 hour(s))  Basic metabolic panel     Status: Abnormal   Collection Time: 04/12/22  1:03 PM  Result Value Ref Range   Sodium 130 (L) 135 - 145 mmol/L   Potassium 3.6 3.5 - 5.1 mmol/L   Chloride 100 98 - 111 mmol/L   CO2 22 22 - 32 mmol/L   Glucose, Bld 110 (H) 70 - 99 mg/dL    Comment: Glucose reference range applies only to samples taken after fasting for at least 8 hours.   BUN 16 8 - 23 mg/dL   Creatinine, Ser 1.19 0.61 - 1.24 mg/dL   Calcium 8.5 (L) 8.9 - 10.3 mg/dL   GFR, Estimated 59 (L) >60 mL/min    Comment: (NOTE) Calculated using the CKD-EPI Creatinine Equation (2021)    Anion gap 8 5 - 15    Comment: Performed at [facility] [address] [place], [place] [address]  CBC with Differential      Status: Abnormal   Collection Time: 04/12/22  1:03 PM  Result Value Ref Range   WBC 7.6 4.0 - 10.5 K/uL   RBC 4.24 4.22 - 5.81 MIL/uL   Hemoglobin 12.3 (L) 13.0 - 17.0 g/dL   HCT 36.1 (L) 39.0 - 52.0 %   MCV 85.1 80.0 - 100.0 fL   MCH 29.0 26.0 - 34.0 pg   MCHC 34.1 30.0 - 36.0 g/dL   RDW 13.2 11.5 - 15.5 %   Platelets 122 (L) 150 - 400 K/uL   nRBC 0.0 0.0 - 0.2 %   Neutrophils Relative % 79 %   Neutro Abs 6.0 1.7 - 7.7 K/uL   Lymphocytes Relative 8 %   Lymphs Abs 0.6 (L) 0.7 - 4.0 K/uL   Monocytes Relative 12 %   Monocytes Absolute 0.9 0.1 - 1.0 K/uL   Eosinophils Relative 0 %   Eosinophils Absolute 0.0 0.0 - 0.5 K/uL   Basophils Relative 0 %   Basophils Absolute 0.0 0.0 - 0.1 K/uL   Immature Granulocytes 1 %   Abs Immature Granulocytes 0.04 0.00 - 0.07 K/uL    Comment: Performed at [facility], [place] [address], [place], [place] [address]  Protime-INR     Status: Abnormal   Collection Time: 04/12/22  1:03 PM  Result Value Ref Range   Prothrombin Time 16.7 (H) 11.4 - 15.2 seconds   INR 1.4 (H) 0.8 - 1.2    Comment: (NOTE) INR goal varies based on device and disease states. Performed at [facility], [place] [address], [place], [place] [address]   Troponin I (High Sensitivity)     Status: None   Collection Time: 04/12/22  1:03 PM  Result Value Ref Range   Troponin I (High Sensitivity) 12 <18 ng/L    Comment: (NOTE) Elevated high sensitivity troponin I (hsTnI) values and significant  changes across serial measurements may suggest ACS but many other  chronic and acute conditions are known to elevate hsTnI results.  Refer to the "Links" section for chest pain algorithms and additional  guidance. Performed at [facility], [place] [address], [place], [place] [address]   Magnesium     Status: None   Collection Time: 04/12/22  1:03 PM  Result Value Ref Range   Magnesium 1.7 1.7 - 2.4 mg/dL    Comment: Performed at [facility], [place] [address]  [place],  [address]  Hepatic function panel     Status: Abnormal   Collection Time: 04/12/22  1:03 PM  Result Value Ref Range   Total Protein 6.4 (L) 6.5 - 8.1 g/dL  Albumin 3.4 (L) 3.5 - 5.0 g/dL   AST 28 15 - 41 U/L   ALT 22 0 - 44 U/L   Alkaline Phosphatase 60 38 - 126 U/L   Total Bilirubin 0.6 0.3 - 1.2 mg/dL   Bilirubin, Direct 0.1 0.0 - 0.2 mg/dL   Indirect Bilirubin 0.5 0.3 - 0.9 mg/dL    Comment: Performed at [facility] [address], [place], [place] [address]  Ammonia     Status: None   Collection Time: 04/12/22  1:28 PM  Result Value Ref Range   Ammonia <10 9 - 35 umol/L    Comment: Performed at [facility], [place] [address] [place], [place] [address]  Lactic acid, plasma     Status: None   Collection Time: 04/12/22  1:28 PM  Result Value Ref Range   Lactic Acid, Venous 0.8 0.5 - 1.9 mmol/L    Comment: Performed at [facility] [address] [place], [place] [address]  Resp panel by RT-PCR (RSV, Flu A&B, Covid) Anterior Nasal Swab     Status: None   Collection Time: 04/12/22  1:30 PM   Specimen: Anterior Nasal Swab  Result Value Ref Range   SARS Coronavirus 2 by RT PCR NEGATIVE NEGATIVE   Influenza A by PCR NEGATIVE NEGATIVE   Influenza B by PCR NEGATIVE NEGATIVE    Comment: (NOTE) The Xpert Xpress SARS-CoV-2/FLU/RSV plus assay is intended as an aid in the diagnosis of influenza from Nasopharyngeal swab specimens and should not be used as a sole basis for treatment. Nasal washings and aspirates are unacceptable for Xpert Xpress SARS-CoV-2/FLU/RSV testing.  Fact Sheet for Patients: EntrepreneurPulse.com.au  Fact Sheet for Healthcare Providers: IncredibleEmployment.be  This test is not yet approved or cleared by the [place] FDA and has been authorized for detection and/or diagnosis of SARS-CoV-2 by FDA under an Emergency Use Authorization (EUA). This EUA will remain in effect (meaning this test  can be used) for the duration of the COVID-19 declaration under Section 564(b)(1) of the Act, 21 U.S.C. section 360bbb-3(b)(1), unless the authorization is terminated or revoked.     Resp Syncytial Virus by PCR NEGATIVE NEGATIVE    Comment: (NOTE) Fact Sheet for Patients: EntrepreneurPulse.com.au  Fact Sheet for Healthcare Providers: IncredibleEmployment.be  This test is not yet approved or cleared by the [place] FDA and has been authorized for detection and/or diagnosis of SARS-CoV-2 by FDA under an Emergency Use Authorization (EUA). This EUA will remain in effect (meaning this test can be used) for the duration of the COVID-19 declaration under Section 564(b)(1) of the Act, 21 U.S.C. section 360bbb-3(b)(1), unless the authorization is terminated or revoked.  Performed at [facility], [place] [address] [place], [place] [address]   CBG monitoring, ED     Status: None   Collection Time: 04/12/22  1:48 PM  Result Value Ref Range   Glucose-Capillary 97 70 - 99 mg/dL    Comment: Glucose reference range applies only to samples taken after fasting for at least 8 hours.  Troponin I (High Sensitivity)     Status: Abnormal   Collection Time: 04/12/22  3:30 PM  Result Value Ref Range   Troponin I (High Sensitivity) 24 (H) <18 ng/L    Comment: (NOTE) Elevated high sensitivity troponin I (hsTnI) values and significant  changes across serial measurements may suggest ACS but many other  chronic and acute conditions are known to elevate hsTnI results.  Refer to the "Links"  section for chest pain algorithms and additional  guidance. Performed at [facility], [place] [address], [place], [place] [address]   CK     Status: Abnormal   Collection Time: 04/12/22  3:30 PM  Result Value Ref Range   Total CK 474 (H) 49 - 397 U/L    Comment: Performed at [facility], [place] [address], [place], [place] [address]   DG FEMUR MIN 2 VIEWS  LEFT  Result Date: 04/12/2022 CLINICAL DATA:  Left femur pain EXAM: LEFT FEMUR 2 VIEWS COMPARISON:  None Available. FINDINGS: Frontal and lateral views of the left femur are obtained. There are no acute displaced fractures. Alignment is anatomic. Mild osteoarthritis of the left hip and knee. Soft tissues are unremarkable. Diffuse atherosclerosis. IMPRESSION: 1. No acute displaced fracture. 2. Mild left hip and knee osteoarthritis. Electronically Signed   By: Randa Ngo M.D.   On: 04/12/2022 17:10   CT CHEST WO CONTRAST  Result Date: 04/12/2022 CLINICAL DATA:  Pneumonia EXAM: CT CHEST WITHOUT CONTRAST TECHNIQUE: Multidetector CT imaging of the chest was performed following the standard protocol without IV contrast. RADIATION DOSE REDUCTION: This exam was performed according to the departmental dose-optimization program which includes automated exposure control, adjustment of the mA and/or kV according to patient size and/or use of iterative reconstruction technique. COMPARISON:  X-ray earlier 04/12/2022. Old CT scan 05/10/2021 and older FINDINGS: Cardiovascular: On this non IV contrast exam, the heart is nonenlarged. Prominent coronary artery calcifications are seen. There also significant calcifications in the area of the aortic valve. The thoracic aorta is nondilated. Maximal diameter of 3.8 cm, similar to previous. Mediastinum/Nodes: On this non IV contrast exam there is no specific abnormal lymph node enlargement seen in the axillary region right hilum. There are a few small less than 1 cm in size nodes identified in the mediastinum which are not pathologic by size criteria. These are unchanged from previous. Small thyroid gland. Normal course and caliber of the thoracic esophagus. Lungs/Pleura: Once again there are areas of lower lung bronchiectasis with scattered areas of scarring and fibrotic changes with interstitial septal thickening. Pleural thickening. In the interval there is some ill-defined  bandlike opacities developing along both lower lobes. Atelectasis versus infiltrate is possible. Please correlate for clinical acute process and recommend follow-up. The small 3 mm left lower lobe nodule is again seen today on series 8, image 141. Few other tiny areas of nodularity identified. Again there are areas of peribronchial thickening as well along both lower lobes with some bronchial debris and opacification. More mild changes seen along the lingula inferiorly. Upper Abdomen: Adrenal glands are preserved in the upper abdomen. Stones suggested in the area of the gallbladder at the edge of the imaging field Musculoskeletal: Mild degenerative changes seen along spine. There is moderate compression of the T5 vertebral body, unchanged from prior. Likely chronic. IMPRESSION: Chronic underlying lung changes again identified with bronchiectasis and interstitial changes with scarring and fibrotic change. However there is developing parenchymal bandlike opacities along both lower lobes with bronchial wall thickening and opacity. Acute infiltrate is in the differential recommend follow-up. No associated pleural effusion. Gallstones. Scattered vascular calcifications with significant calcifications along the aortic valve and slight ectasia of the ascending aorta, similar to previous Aortic Atherosclerosis (ICD10-I70.0). Electronically Signed   By: Jill Side M.D.   On: 04/12/2022 16:51   CT Head Wo Contrast  Result Date: 04/12/2022 CLINICAL DATA:  Neuro deficit, acute, stroke suspected EXAM: CT HEAD WITHOUT CONTRAST  TECHNIQUE: Contiguous axial images were obtained from the base of the skull through the vertex without intravenous contrast. RADIATION DOSE REDUCTION: This exam was performed according to the departmental dose-optimization program which includes automated exposure control, adjustment of the mA and/or kV according to patient size and/or use of iterative reconstruction technique. COMPARISON:  CT of the  face 07/19/2017. FINDINGS: Brain: There is periventricular white matter decreased attenuation consistent with small vessel ischemic changes. Ventricles, sulci and cisterns are prominent consistent with age related involutional changes. No acute intracranial hemorrhage, mass effect or shift. No hydrocephalus. Vascular: No hyperdense vessel or unexpected calcification. Skull: Normal. Negative for fracture or focal lesion. Sinuses/Orbits: Mucoperiosteal thickening consistent with chronic ethmoid and maxillary sinusitis. Status post bilateral medial ethmoidectomies. Chronic nasal fractures identified. No acute calvarial abnormalities. IMPRESSION: Atrophy and chronic small vessel ischemic changes. No acute intracranial process identified. Electronically Signed   By: Sammie Bench M.D.   On: 04/12/2022 16:51   DG Chest Port 1 View  Result Date: 04/12/2022 CLINICAL DATA:  Fever EXAM: PORTABLE CHEST 1 VIEW COMPARISON:  03/06/2020 FINDINGS: Cardiomegaly. Diffuse bilateral interstitial pulmonary opacity. The visualized skeletal structures are unremarkable. IMPRESSION: Cardiomegaly with diffuse bilateral interstitial pulmonary opacity, consistent with edema or atypical/viral infection. No focal airspace opacity. Electronically Signed   By: Delanna Ahmadi M.D.   On: 04/12/2022 13:56   ECHOCARDIOGRAM COMPLETE  Result Date: 04/11/2022    ECHOCARDIOGRAM REPORT   Patient Name:   Chad Morrison   Date of Exam: 04/11/2022 Medical Rec #:  [id number]     Height:       69.0 in Accession #:    [id number]    Weight:       150.2 lb Date of Birth:  Sep 14, 1932      BSA:          1.829 m Patient Age:    84 years      BP:           140/74 mmHg Patient Gender: M             HR:           85 bpm. Exam Location:  [address] Procedure: 2D Echo, Cardiac Doppler and Color Doppler Indications:    I35.0 AS  History:        Patient has prior history of Echocardiogram examinations, most                 recent 07/08/2021. AS, Arrythmias:Atrial  Fibrillation; Risk                 Factors:Hypertension, Dyslipidemia and Former Smoker.  Sonographer:    Coralyn Helling RDCS Referring Phys: [id number] [place]  1. Left ventricular ejection fraction, by estimation, is 55 to 60%. The left ventricle has normal function. The left ventricle has no regional wall motion abnormalities. There is mild concentric left ventricular hypertrophy. Left ventricular diastolic parameters are indeterminate.  2. Right ventricular systolic function is normal. The right ventricular size is normal. Tricuspid regurgitation signal is inadequate for assessing PA pressure.  3. Left atrial size was moderately dilated.  4. Right atrial size was moderately dilated.  5. The mitral valve is normal in structure. Mild mitral valve regurgitation. No evidence of mitral stenosis.  6. The aortic valve is tricuspid. There is severe calcifcation of the aortic valve. Aortic valve regurgitation is trivial. Severe aortic valve stenosis. Aortic valve area, by VTI measures 0.68 cm. Aortic valve mean gradient measures 42.5 mmHg.  7. Aortic dilatation noted. There is mild dilatation of the ascending aorta, measuring 39 mm.  8. The inferior vena cava is normal in size with greater than 50% respiratory variability, suggesting right atrial pressure of 3 mmHg.  9. The patient was in atrial fibrillation. FINDINGS  Left Ventricle: Left ventricular ejection fraction, by estimation, is 55 to 60%. The left ventricle has normal function. The left ventricle has no regional wall motion abnormalities. The left ventricular internal cavity size was normal in size. There is  mild concentric left ventricular hypertrophy. Left ventricular diastolic parameters are indeterminate. Right Ventricle: The right ventricular size is normal. No increase in right ventricular wall thickness. Right ventricular systolic function is normal. Tricuspid regurgitation signal is inadequate for assessing PA pressure. The tricuspid  regurgitant velocity is 2.87 m/s, and with an assumed right atrial pressure of 3 mmHg, the estimated right ventricular systolic pressure is [id number] mmHg. Left Atrium: Left atrial size was moderately dilated. Right Atrium: Right atrial size was moderately dilated. Pericardium: There is no evidence of pericardial effusion. Mitral Valve: The mitral valve is normal in structure. Mild mitral annular calcification. Mild mitral valve regurgitation. No evidence of mitral valve stenosis. Tricuspid Valve: The tricuspid valve is normal in structure. Tricuspid valve regurgitation is mild. Aortic Valve: The aortic valve is tricuspid. There is severe calcifcation of the aortic valve. Aortic valve regurgitation is trivial. Severe aortic stenosis is present. Aortic valve mean gradient measures 42.5 mmHg. Aortic valve peak gradient measures 70.0 mmHg. Aortic valve area, by VTI measures 0.68 cm. Pulmonic Valve: The pulmonic valve was normal in structure. Pulmonic valve regurgitation is trivial. Aorta: The ascending aorta was not well visualized and aortic dilatation noted. There is mild dilatation of the ascending aorta, measuring 39 mm. Venous: The inferior vena cava is normal in size with greater than 50% respiratory variability, suggesting right atrial pressure of 3 mmHg. IAS/Shunts: No atrial level shunt detected by color flow Doppler.  LEFT VENTRICLE PLAX 2D LVIDd:         4.30 cm LVIDs:         2.70 cm LV PW:         1.20 cm LV IVS:        1.50 cm LVOT diam:     2.20 cm LV SV:         55 LV SV Index:   30 LVOT Area:     3.80 cm  RIGHT VENTRICLE            IVC RVSP:           35.9 mmHg  IVC diam: 0.90 cm LEFT ATRIUM             Index        RIGHT ATRIUM           Index LA diam:        4.80 cm 2.62 cm/m   RA Pressure: 3.00 mmHg LA Vol (A2C):   81.1 ml 44.33 ml/m  RA Area:     29.70 cm LA Vol (A4C):   80.4 ml 43.95 ml/m  RA Volume:   107.00 ml 58.49 ml/m LA Biplane Vol: 85.2 ml 46.58 ml/m  AORTIC VALVE AV Area (Vmax):    0.64  cm AV Area (Vmean):   0.63 cm AV Area (VTI):     0.68 cm AV Vmax:           418.40 cm/s AV Vmean:          280.000  cm/s AV VTI:            0.799 m AV Peak Grad:      70.0 mmHg AV Mean Grad:      42.5 mmHg LVOT Vmax:         70.94 cm/s LVOT Vmean:        46.460 cm/s LVOT VTI:          0.144 m LVOT/AV VTI ratio: 0.18  AORTA Ao Root diam: 3.70 cm Ao Asc diam:  3.90 cm MITRAL VALVE               TRICUSPID VALVE MV Area (PHT): 4.91 cm    TR Peak grad:   32.9 mmHg MV Decel Time: 155 msec    TR Vmax:        287.00 cm/s MV E velocity: 96.00 cm/s  Estimated RAP:  3.00 mmHg                            RVSP:           35.9 mmHg                             SHUNTS                            Systemic VTI:  0.14 m                            Systemic Diam: 2.20 cm Dalton McleanMD Electronically signed by Franki Monte Signature Date/Time: 04/11/2022/1:37:52 PM    Final     Pending Labs Unresulted Labs (From admission, onward)     Start     Ordered   04/13/22 [id number]  Basic metabolic panel  Tomorrow morning,   R        04/12/22 1551   04/13/22 0500  CBC  Tomorrow morning,   R        04/12/22 1551   04/12/22 1700  Mycoplasma pneumoniae antibody, IgM  Once,   R        04/12/22 1700   04/12/22 1551  Expectorated Sputum Assessment w Gram Stain, Rflx to Resp Cult  (COPD / Pneumonia / Cellulitis / Lower Extremity Wound)  Once,   R        04/12/22 1551   04/12/22 1551  Legionella Pneumophila Serogp 1 Ur Ag  (COPD / Pneumonia / Cellulitis / Lower Extremity Wound)  Once,   R        04/12/22 1551   04/12/22 1551  Strep pneumoniae urinary antigen  (COPD / Pneumonia / Cellulitis / Lower Extremity Wound)  Once,   R        04/12/22 1551   04/12/22 1534  Blood gas, venous  Once,   R        04/12/22 1533   04/12/22 1530  Urinalysis, Routine w reflex microscopic -Urine, Clean Catch  Once,   URGENT       Question:  Specimen Source  Answer:  Urine, Clean Catch   04/12/22 1529   04/12/22 1519  MRSA Next Gen by PCR, Nasal  (MRSA  Screening)  Once,   URGENT        04/12/22 1518            Vitals/Pain Today's Vitals   04/12/22  1259 04/12/22 1340 04/12/22 1522 04/12/22 1608  BP:      Pulse:      Resp:      Temp:  (!) 103.4 F (39.7 C)  99.3 F (37.4 C)  TempSrc:  Rectal  Oral  SpO2:      Weight: 70.3 kg     Height: 5' 9"$  (1.753 m)     PainSc: 10-Worst pain ever  5      Isolation Precautions No active isolations  Medications Medications  diltiazem (CARDIZEM) tablet 60 mg (has no administration in time range)  rosuvastatin (CRESTOR) tablet 5 mg (has no administration in time range)  ALPRAZolam (XANAX) tablet 0.25 mg (has no administration in time range)  levothyroxine (SYNTHROID) tablet 75 mcg (has no administration in time range)  apixaban (ELIQUIS) tablet 5 mg (has no administration in time range)  ferrous sulfate tablet 325 mg (has no administration in time range)  albuterol (VENTOLIN HFA) 108 (90 Base) MCG/ACT inhaler 2 puff (has no administration in time range)  azelastine (ASTELIN) 0.1 % nasal spray 1 spray (has no administration in time range)  mometasone-formoterol (DULERA) 200-5 MCG/ACT inhaler 2 puff (has no administration in time range)  loratadine (CLARITIN) tablet 10 mg (has no administration in time range)  montelukast (SINGULAIR) tablet 10 mg (has no administration in time range)  ketorolac (ACULAR) 0.5 % ophthalmic solution 1 drop (has no administration in time range)  polyethylene glycol 0.4% and propylene glycol 0.3% (SYSTANE) ophthalmic gel (has no administration in time range)  cefTRIAXone (ROCEPHIN) 2 g in sodium chloride 0.9 % 100 mL IVPB (has no administration in time range)  azithromycin (ZITHROMAX) 500 mg in sodium chloride 0.9 % 250 mL IVPB (has no administration in time range)  ondansetron (ZOFRAN) tablet 4 mg (has no administration in time range)    Or  ondansetron (ZOFRAN) injection 4 mg (has no administration in time range)  acetaminophen (TYLENOL) tablet 650 mg (has no  administration in time range)    Or  acetaminophen (TYLENOL) suppository 650 mg (has no administration in time range)  vancomycin (VANCOREADY) IVPB 1500 mg/300 mL (1,500 mg Intravenous New Bag/Given 04/12/22 1607)    Mobility walks     Focused Assessments Cardiac Assessment Handoff:    Lab Results  Component Value Date   CKTOTAL 474 (H) 04/12/2022   Lab Results  Component Value Date   DDIMER 0.67 (H) 12/01/2017   Does the Patient currently have chest pain? No    R Recommendations: See Admitting Provider Note  Report given to:   Additional Notes:

## 2022-04-13 ENCOUNTER — Other Ambulatory Visit (HOSPITAL_COMMUNITY): Payer: Self-pay

## 2022-04-13 DIAGNOSIS — J189 Pneumonia, unspecified organism: Secondary | ICD-10-CM | POA: Diagnosis present

## 2022-04-13 DIAGNOSIS — I4821 Permanent atrial fibrillation: Secondary | ICD-10-CM

## 2022-04-13 DIAGNOSIS — J449 Chronic obstructive pulmonary disease, unspecified: Secondary | ICD-10-CM | POA: Diagnosis not present

## 2022-04-13 DIAGNOSIS — J4550 Severe persistent asthma, uncomplicated: Secondary | ICD-10-CM | POA: Diagnosis not present

## 2022-04-13 LAB — CBC
HCT: 36.4 % — ABNORMAL LOW (ref 39.0–52.0)
Hemoglobin: 12.7 g/dL — ABNORMAL LOW (ref 13.0–17.0)
MCH: 29.3 pg (ref 26.0–34.0)
MCHC: 34.9 g/dL (ref 30.0–36.0)
MCV: 84.1 fL (ref 80.0–100.0)
Platelets: 128 10*3/uL — ABNORMAL LOW (ref 150–400)
RBC: 4.33 MIL/uL (ref 4.22–5.81)
RDW: 13.2 % (ref 11.5–15.5)
WBC: 7.9 10*3/uL (ref 4.0–10.5)
nRBC: 0 % (ref 0.0–0.2)

## 2022-04-13 LAB — BASIC METABOLIC PANEL
Anion gap: 14 (ref 5–15)
BUN: 15 mg/dL (ref 8–23)
CO2: 20 mmol/L — ABNORMAL LOW (ref 22–32)
Calcium: 8.7 mg/dL — ABNORMAL LOW (ref 8.9–10.3)
Chloride: 96 mmol/L — ABNORMAL LOW (ref 98–111)
Creatinine, Ser: 1.33 mg/dL — ABNORMAL HIGH (ref 0.61–1.24)
GFR, Estimated: 52 mL/min — ABNORMAL LOW (ref >60)
Glucose, Bld: 112 mg/dL — ABNORMAL HIGH (ref 70–99)
Potassium: 3.8 mmol/L (ref 3.5–5.1)
Sodium: 130 mmol/L — ABNORMAL LOW (ref 135–145)

## 2022-04-13 LAB — C-REACTIVE PROTEIN: CRP: 4.7 mg/dL — ABNORMAL HIGH (ref <1.0)

## 2022-04-13 LAB — SEDIMENTATION RATE: Sed Rate: 23 mm/hr — ABNORMAL HIGH (ref 0–16)

## 2022-04-13 MED ORDER — AZITHROMYCIN 500 MG PO TABS
500.0000 mg | ORAL_TABLET | Freq: Once | ORAL | Status: AC
  Administered 2022-04-13: 500 mg via ORAL
  Filled 2022-04-13: qty 1

## 2022-04-13 MED ORDER — LEVOFLOXACIN 750 MG PO TABS
750.0000 mg | ORAL_TABLET | ORAL | 0 refills | Status: AC
  Filled 2022-04-13: qty 3, 6d supply, fill #0

## 2022-04-13 NOTE — Plan of Care (Signed)
Patient to  discharge home with antibiotics.

## 2022-04-13 NOTE — Care Management CC44 (Signed)
Condition Code 44 Documentation Completed  Patient Details  Name: Chad Morrison MRN: [id number] Date of Birth: [birth year]   Condition Code 44 given:  Yes Patient signature on Condition Code 44 notice:  Yes Documentation of 2 MD's agreement:  Yes Code 44 added to claim:  Yes    Levonne Lapping, RN 04/13/2022, 12:02 PM

## 2022-04-13 NOTE — Care Management Obs Status (Signed)
[facility] NOTIFICATION   Patient Details  Name: Chad Morrison MRN: [id number] Date of Birth: 1932-07-14   Medicare Observation Status Notification Given:       Levonne Lapping, RN 04/13/2022, 12:13 PM

## 2022-04-13 NOTE — Discharge Summary (Signed)
PATIENT DETAILS Name: Chad Morrison Age: 89 y.o. Sex: male Date of Birth: [birth year] MRN: [id number]. Admitting Physician: Chad Halt, MD [id number], Chad Him, MD  Admit Date: 04/12/2022 Discharge date: 04/13/2022  Recommendations for Outpatient Follow-up:  Follow up with PCP in 1-2 weeks Please obtain CMP/CBC in one week  Admitted From:  Home  Disposition: Home   Discharge Condition: good  CODE STATUS:   Code Status: Full Code   Diet recommendation:  Diet Order             Diet - low sodium heart healthy           Diet Heart Room service appropriate? Yes; Fluid consistency: Thin  Diet effective now                    Brief Summary: 89 year old with history of Wegener's granulomatosis, severe asthma with eosinophilia-presented to the hospital with weakness/fever/cough-he was thought to have PNA and subsequently admitted to the hospitalist service.  Brief Hospital Course: Sepsis Probable PNA (not felt to have flare of underlying Wegener's/bronchiectasis) Significantly better-on room air-wants to go home.  Sepsis physiology has completely resolved Given history of Wegener's granulomatosis/severe asthma on a biological agent-pulmonology was consulted.  Discussed with Chad Morrison 1 week of antibiotics and he will follow-up in the outpatient setting.  Okay for discharge from his point of view.  History of Wegener's granulomatosis/bronchiectasis/ILD Per pulmonologist-prior imaging did not have any parenchymal lesions Plan is to treat with antibiotics x 7 days-and repeat imaging at the discretion of pulmonology.  History of severe asthma with eosinophilia Not in exacerbation Resume prior inhaler regimen Resume biologic agent as previous  Severe aortic stenosis Continue outpatient follow-up with cardiology  PAF Continue Cardizem/Eliquis  BMI: Estimated body mass index is 22.89 kg/m as calculated from the following:   Height as of this  encounter: 5' 9"$  (1.753 m).   Weight as of this encounter: 70.3 kg.    Discharge Diagnoses:  Principal Problem:   PNA (pneumonia) Active Problems:   COPD (chronic obstructive pulmonary disease) (HCC)   Atrial fibrillation (HCC)   Asthma   CAP (community acquired pneumonia)   Discharge Instructions:  Activity:  As tolerated   Discharge Instructions     Call MD for:  difficulty breathing, headache or visual disturbances   Complete by: As directed    Diet - low sodium heart healthy   Complete by: As directed    Discharge instructions   Complete by: As directed    Follow with Primary MD  Chad Morrison, Chad Him, MD in 1-2 weeks  Follow-up with pulmonologist-their office will give you a call.  You need a repeat CT scan/chest x-ray after you complete a course of antibiotics.  Please get a complete blood count and chemistry panel checked by your Primary MD at your next visit, and again as instructed by your Primary MD.  Get Medicines reviewed and adjusted: Please take all your medications with you for your next visit with your Primary MD  Laboratory/radiological data: Please request your Primary MD to go over all hospital tests and procedure/radiological results at the follow up, please ask your Primary MD to get all Hospital records sent to his/her office.  In some cases, they will be blood work, cultures and biopsy results pending at the time of your discharge. Please request that your primary care M.D. follows up on these results.  Also Note the following: If you experience worsening of your admission symptoms, develop shortness of  breath, life threatening emergency, suicidal or homicidal thoughts you must seek medical attention immediately by calling 911 or calling your MD immediately  if symptoms less severe.  You must read complete instructions/literature along with all the possible adverse reactions/side effects for all the Medicines you take and that have been prescribed to  you. Take any new Medicines after you have completely understood and accpet all the possible adverse reactions/side effects.   Do not drive when taking Pain medications or sleeping medications (Benzodaizepines)  Do not take more than prescribed Pain, Sleep and Anxiety Medications. It is not advisable to combine anxiety,sleep and pain medications without talking with your primary care practitioner  Special Instructions: If you have smoked or chewed Tobacco  in the last 2 yrs please stop smoking, stop any regular Alcohol  and or any Recreational drug use.  Wear Seat belts while driving.  Please note: You were cared for by a hospitalist during your hospital stay. Once you are discharged, your primary care physician will handle any further medical issues. Please note that NO REFILLS for any discharge medications will be authorized once you are discharged, as it is imperative that you return to your primary care physician (or establish a relationship with a primary care physician if you do not have one) for your post hospital discharge needs so that they can reassess your need for medications and monitor your lab values.   Increase activity slowly   Complete by: As directed       Allergies as of 04/13/2022       Reactions   Other Other (See Comments)   Latex Gloves   Penicillins Hives   Has patient had a PCN reaction causing immediate rash, facial/tongue/throat swelling, SOB or lightheadedness with hypotension: No Has patient had a PCN reaction causing severe rash involving mucus membranes or skin necrosis: No Has patient had a PCN reaction that required hospitalization: No Has patient had a PCN reaction occurring within the last 10 years: Yes If all of the above answers are "NO", then may proceed with Cephalosporin use.   Sulfa Antibiotics Other (See Comments)   Unknown to patient   Sulfamethoxazole-trimethoprim Other (See Comments)   Latex Hives, Rash        Medication List      TAKE these medications    albuterol 108 (90 Base) MCG/ACT inhaler Commonly known as: VENTOLIN HFA Inhale 2 puffs into the lungs every 4 (four) hours as needed for wheezing or shortness of breath.   ALPRAZolam 0.25 MG tablet Commonly known as: XANAX Take 0.25 mg by mouth at bedtime as needed for sleep.   azelastine 0.1 % nasal spray Commonly known as: ASTELIN Place 1 spray into both nostrils 2 (two) times daily.   brimonidine 0.15 % ophthalmic solution Commonly known as: ALPHAGAN INSTILL 1 DROP IN RIGHT EYE EVERY 8 HOURS   budesonide-formoterol 160-4.5 MCG/ACT inhaler Commonly known as: Symbicort Inhale 2 puffs into the lungs 2 (two) times daily. What changed: Another medication with the same name was removed. Continue taking this medication, and follow the directions you see here.   Calcium-Vitamin D 600-125 MG-UNIT Tabs Take 1 tablet by mouth 2 (two) times daily.   diltiazem 180 MG 24 hr capsule Commonly known as: CARDIZEM CD TAKE 1 CAPSULE EVERY       MORNING AND AT BEDTIME What changed: See the new instructions.   Eliquis 5 MG Tabs tablet Generic drug: apixaban TAKE 1 TABLET TWICE A DAY What changed: how much to  take   EPINEPHrine 0.3 mg/0.3 mL Soaj injection Commonly known as: EPI-PEN SMARTSIG:0.3 Milligram(s) IM Once What changed:  how much to take how to take this when to take this reasons to take this   esomeprazole 40 MG capsule Commonly known as: NEXIUM Take 1 capsule (40 mg total) by mouth 2 (two) times daily.   FASENRA [place] Inject into the skin every 8 (eight) weeks.   ketorolac 0.5 % ophthalmic solution Commonly known as: ACULAR Place 1 drop into the right eye 3 (three) times daily.   levocetirizine 5 MG tablet Commonly known as: XYZAL TAKE 1 TABLET EVERY EVENING   levofloxacin 750 MG tablet Commonly known as: Levaquin Take 1 tablet (750 mg total) by mouth every other day for 6 days. Start taking on: April 14, 2022   levothyroxine 75  MCG tablet Commonly known as: SYNTHROID Take 75 mcg by mouth daily before breakfast.   loteprednol 0.5 % ophthalmic suspension Commonly known as: LOTEMAX Place 1 drop into the right eye daily.   montelukast 10 MG tablet Commonly known as: SINGULAIR Take 1 tablet (10 mg total) by mouth at bedtime.   multivitamin with minerals Tabs tablet Take 1 tablet daily by mouth.   OCUVITE PRESERVISION PO Take 1 tablet 2 (two) times daily by mouth.   rosuvastatin 5 MG tablet Commonly known as: CRESTOR TAKE 1 TABLET DAILY   SLOW FE PO Take 1 tablet by mouth every morning.   SYSTANE OP Place 1 drop into both eyes every morning.   VITAMIN C PO Take 1 tablet by mouth every morning. Not sure the dosage        Allergies  Allergen Reactions   Other Other (See Comments)    Latex Gloves   Penicillins Hives    Has patient had a PCN reaction causing immediate rash, facial/tongue/throat swelling, SOB or lightheadedness with hypotension: No Has patient had a PCN reaction causing severe rash involving mucus membranes or skin necrosis: No Has patient had a PCN reaction that required hospitalization: No Has patient had a PCN reaction occurring within the last 10 years: Yes If all of the above answers are "NO", then may proceed with Cephalosporin use.    Sulfa Antibiotics Other (See Comments)    Unknown to patient    Sulfamethoxazole-Trimethoprim Other (See Comments)   Latex Hives and Rash     Other Procedures/Studies: DG FEMUR MIN 2 VIEWS LEFT  Result Date: 04/12/2022 CLINICAL DATA:  Left femur pain EXAM: LEFT FEMUR 2 VIEWS COMPARISON:  None Available. FINDINGS: Frontal and lateral views of the left femur are obtained. There are no acute displaced fractures. Alignment is anatomic. Mild osteoarthritis of the left hip and knee. Soft tissues are unremarkable. Diffuse atherosclerosis. IMPRESSION: 1. No acute displaced fracture. 2. Mild left hip and knee osteoarthritis. Electronically Signed    By: Randa Ngo M.D.   On: 04/12/2022 17:10   CT CHEST WO CONTRAST  Result Date: 04/12/2022 CLINICAL DATA:  Pneumonia EXAM: CT CHEST WITHOUT CONTRAST TECHNIQUE: Multidetector CT imaging of the chest was performed following the standard protocol without IV contrast. RADIATION DOSE REDUCTION: This exam was performed according to the departmental dose-optimization program which includes automated exposure control, adjustment of the mA and/or kV according to patient size and/or use of iterative reconstruction technique. COMPARISON:  X-ray earlier 04/12/2022. Old CT scan 05/10/2021 and older FINDINGS: Cardiovascular: On this non IV contrast exam, the heart is nonenlarged. Prominent coronary artery calcifications are seen. There also significant calcifications in the area  of the aortic valve. The thoracic aorta is nondilated. Maximal diameter of 3.8 cm, similar to previous. Mediastinum/Nodes: On this non IV contrast exam there is no specific abnormal lymph node enlargement seen in the axillary region right hilum. There are a few small less than 1 cm in size nodes identified in the mediastinum which are not pathologic by size criteria. These are unchanged from previous. Small thyroid gland. Normal course and caliber of the thoracic esophagus. Lungs/Pleura: Once again there are areas of lower lung bronchiectasis with scattered areas of scarring and fibrotic changes with interstitial septal thickening. Pleural thickening. In the interval there is some ill-defined bandlike opacities developing along both lower lobes. Atelectasis versus infiltrate is possible. Please correlate for clinical acute process and recommend follow-up. The small 3 mm left lower lobe nodule is again seen today on series 8, image 141. Few other tiny areas of nodularity identified. Again there are areas of peribronchial thickening as well along both lower lobes with some bronchial debris and opacification. More mild changes seen along the lingula  inferiorly. Upper Abdomen: Adrenal glands are preserved in the upper abdomen. Stones suggested in the area of the gallbladder at the edge of the imaging field Musculoskeletal: Mild degenerative changes seen along spine. There is moderate compression of the T5 vertebral body, unchanged from prior. Likely chronic. IMPRESSION: Chronic underlying lung changes again identified with bronchiectasis and interstitial changes with scarring and fibrotic change. However there is developing parenchymal bandlike opacities along both lower lobes with bronchial wall thickening and opacity. Acute infiltrate is in the differential recommend follow-up. No associated pleural effusion. Gallstones. Scattered vascular calcifications with significant calcifications along the aortic valve and slight ectasia of the ascending aorta, similar to previous Aortic Atherosclerosis (ICD10-I70.0). Electronically Signed   By: Jill Side M.D.   On: 04/12/2022 16:51   CT Head Wo Contrast  Result Date: 04/12/2022 CLINICAL DATA:  Neuro deficit, acute, stroke suspected EXAM: CT HEAD WITHOUT CONTRAST TECHNIQUE: Contiguous axial images were obtained from the base of the skull through the vertex without intravenous contrast. RADIATION DOSE REDUCTION: This exam was performed according to the departmental dose-optimization program which includes automated exposure control, adjustment of the mA and/or kV according to patient size and/or use of iterative reconstruction technique. COMPARISON:  CT of the face 07/19/2017. FINDINGS: Brain: There is periventricular white matter decreased attenuation consistent with small vessel ischemic changes. Ventricles, sulci and cisterns are prominent consistent with age related involutional changes. No acute intracranial hemorrhage, mass effect or shift. No hydrocephalus. Vascular: No hyperdense vessel or unexpected calcification. Skull: Normal. Negative for fracture or focal lesion. Sinuses/Orbits: Mucoperiosteal  thickening consistent with chronic ethmoid and maxillary sinusitis. Status post bilateral medial ethmoidectomies. Chronic nasal fractures identified. No acute calvarial abnormalities. IMPRESSION: Atrophy and chronic small vessel ischemic changes. No acute intracranial process identified. Electronically Signed   By: Sammie Bench M.D.   On: 04/12/2022 16:51   DG Chest Port 1 View  Result Date: 04/12/2022 CLINICAL DATA:  Fever EXAM: PORTABLE CHEST 1 VIEW COMPARISON:  03/06/2020 FINDINGS: Cardiomegaly. Diffuse bilateral interstitial pulmonary opacity. The visualized skeletal structures are unremarkable. IMPRESSION: Cardiomegaly with diffuse bilateral interstitial pulmonary opacity, consistent with edema or atypical/viral infection. No focal airspace opacity. Electronically Signed   By: Delanna Ahmadi M.D.   On: 04/12/2022 13:56   ECHOCARDIOGRAM COMPLETE  Result Date: 04/11/2022    ECHOCARDIOGRAM REPORT   Patient Name:   Chad Morrison   Date of Exam: 04/11/2022 Medical Rec #:  [id number]  Height:       69.0 in Accession #:    [id number]    Weight:       150.2 lb Date of Birth:  19-Apr-1932      BSA:          1.829 m Patient Age:    41 years      BP:           140/74 mmHg Patient Gender: M             HR:           85 bpm. Exam Location:  [place] Procedure: 2D Echo, Cardiac Doppler and Color Doppler Indications:    I35.0 AS  History:        Patient has prior history of Echocardiogram examinations, most                 recent 07/08/2021. AS, Arrythmias:Atrial Fibrillation; Risk                 Factors:Hypertension, Dyslipidemia and Former Smoker.  Sonographer:    Coralyn Helling RDCS Referring Phys: [id number] [place]  1. Left ventricular ejection fraction, by estimation, is 55 to 60%. The left ventricle has normal function. The left ventricle has no regional wall motion abnormalities. There is mild concentric left ventricular hypertrophy. Left ventricular diastolic parameters are  indeterminate.  2. Right ventricular systolic function is normal. The right ventricular size is normal. Tricuspid regurgitation signal is inadequate for assessing PA pressure.  3. Left atrial size was moderately dilated.  4. Right atrial size was moderately dilated.  5. The mitral valve is normal in structure. Mild mitral valve regurgitation. No evidence of mitral stenosis.  6. The aortic valve is tricuspid. There is severe calcifcation of the aortic valve. Aortic valve regurgitation is trivial. Severe aortic valve stenosis. Aortic valve area, by VTI measures 0.68 cm. Aortic valve mean gradient measures 42.5 mmHg.  7. Aortic dilatation noted. There is mild dilatation of the ascending aorta, measuring 39 mm.  8. The inferior vena cava is normal in size with greater than 50% respiratory variability, suggesting right atrial pressure of 3 mmHg.  9. The patient was in atrial fibrillation. FINDINGS  Left Ventricle: Left ventricular ejection fraction, by estimation, is 55 to 60%. The left ventricle has normal function. The left ventricle has no regional wall motion abnormalities. The left ventricular internal cavity size was normal in size. There is  mild concentric left ventricular hypertrophy. Left ventricular diastolic parameters are indeterminate. Right Ventricle: The right ventricular size is normal. No increase in right ventricular wall thickness. Right ventricular systolic function is normal. Tricuspid regurgitation signal is inadequate for assessing PA pressure. The tricuspid regurgitant velocity is 2.87 m/s, and with an assumed right atrial pressure of 3 mmHg, the estimated right ventricular systolic pressure is [id number]. Left Atrium: Left atrial size was moderately dilated. Right Atrium: Right atrial size was moderately dilated. Pericardium: There is no evidence of pericardial effusion. Mitral Valve: The mitral valve is normal in structure. Mild mitral annular calcification. Mild mitral valve regurgitation. No  evidence of mitral valve stenosis. Tricuspid Valve: The tricuspid valve is normal in structure. Tricuspid valve regurgitation is mild. Aortic Valve: The aortic valve is tricuspid. There is severe calcifcation of the aortic valve. Aortic valve regurgitation is trivial. Severe aortic stenosis is present. Aortic valve mean gradient measures 42.5 mmHg. Aortic valve peak gradient measures 70.0 mmHg. Aortic valve area, by VTI measures 0.68 cm. Pulmonic Valve:  The pulmonic valve was normal in structure. Pulmonic valve regurgitation is trivial. Aorta: The ascending aorta was not well visualized and aortic dilatation noted. There is mild dilatation of the ascending aorta, measuring 39 mm. Venous: The inferior vena cava is normal in size with greater than 50% respiratory variability, suggesting right atrial pressure of 3 mmHg. IAS/Shunts: No atrial level shunt detected by color flow Doppler.  LEFT VENTRICLE PLAX 2D LVIDd:         4.30 cm LVIDs:         2.70 cm LV PW:         1.20 cm LV IVS:        1.50 cm LVOT diam:     2.20 cm LV SV:         55 LV SV Index:   30 LVOT Area:     3.80 cm  RIGHT VENTRICLE            IVC RVSP:           35.9 mmHg  IVC diam: 0.90 cm LEFT ATRIUM             Index        RIGHT ATRIUM           Index LA diam:        4.80 cm 2.62 cm/m   RA Pressure: 3.00 mmHg LA Vol (A2C):   81.1 ml 44.33 ml/m  RA Area:     29.70 cm LA Vol (A4C):   80.4 ml 43.95 ml/m  RA Volume:   107.00 ml 58.49 ml/m LA Biplane Vol: 85.2 ml 46.58 ml/m  AORTIC VALVE AV Area (Vmax):    0.64 cm AV Area (Vmean):   0.63 cm AV Area (VTI):     0.68 cm AV Vmax:           418.40 cm/s AV Vmean:          280.000 cm/s AV VTI:            0.799 m AV Peak Grad:      70.0 mmHg AV Mean Grad:      42.5 mmHg LVOT Vmax:         70.94 cm/s LVOT Vmean:        46.460 cm/s LVOT VTI:          0.144 m LVOT/AV VTI ratio: 0.18  AORTA Ao Root diam: 3.70 cm Ao Asc diam:  3.90 cm MITRAL VALVE               TRICUSPID VALVE MV Area (PHT): 4.91 cm    TR  Peak grad:   32.9 mmHg MV Decel Time: 155 msec    TR Vmax:        287.00 cm/s MV E velocity: 96.00 cm/s  Estimated RAP:  3.00 mmHg                            RVSP:           35.9 mmHg                             SHUNTS                            Systemic VTI:  0.14 m  Systemic Diam: 2.20 cm Dalton McleanMD Electronically signed by Franki Monte Signature Date/Time: 04/11/2022/1:37:52 PM    Final      TODAY-DAY OF DISCHARGE:  Subjective:   Chad Morrison today has no headache,no chest abdominal pain,no new weakness tingling or numbness, feels much better wants to go home today.   Objective:   Blood pressure 139/75, pulse 88, temperature 99.5 F (37.5 C), resp. rate (!) 21, height 5' 9"$  (1.753 m), weight 70.3 kg, SpO2 93 %.  Intake/Output Summary (Last 24 hours) at 04/13/2022 1124 Last data filed at 04/13/2022 1000 Gross per 24 hour  Intake 440 ml  Output --  Net 440 ml   Filed Weights   04/12/22 1259  Weight: 70.3 kg    Exam: Awake Alert, Oriented *3, No new F.N deficits, Normal affect [place].AT,PERRAL Supple Neck,No JVD, No cervical lymphadenopathy appriciated.  Symmetrical Chest wall movement, Good air movement bilaterally, CTAB RRR,No Gallops,Rubs or new Murmurs, No Parasternal Heave +ve B.Sounds, Abd Soft, Non tender, No organomegaly appriciated, No rebound -guarding or rigidity. No Cyanosis, Clubbing or edema, No new Rash or bruise   PERTINENT RADIOLOGIC STUDIES: DG FEMUR MIN 2 VIEWS LEFT  Result Date: 04/12/2022 CLINICAL DATA:  Left femur pain EXAM: LEFT FEMUR 2 VIEWS COMPARISON:  None Available. FINDINGS: Frontal and lateral views of the left femur are obtained. There are no acute displaced fractures. Alignment is anatomic. Mild osteoarthritis of the left hip and knee. Soft tissues are unremarkable. Diffuse atherosclerosis. IMPRESSION: 1. No acute displaced fracture. 2. Mild left hip and knee osteoarthritis. Electronically Signed   By: Randa Ngo  M.D.   On: 04/12/2022 17:10   CT CHEST WO CONTRAST  Result Date: 04/12/2022 CLINICAL DATA:  Pneumonia EXAM: CT CHEST WITHOUT CONTRAST TECHNIQUE: Multidetector CT imaging of the chest was performed following the standard protocol without IV contrast. RADIATION DOSE REDUCTION: This exam was performed according to the departmental dose-optimization program which includes automated exposure control, adjustment of the mA and/or kV according to patient size and/or use of iterative reconstruction technique. COMPARISON:  X-ray earlier 04/12/2022. Old CT scan 05/10/2021 and older FINDINGS: Cardiovascular: On this non IV contrast exam, the heart is nonenlarged. Prominent coronary artery calcifications are seen. There also significant calcifications in the area of the aortic valve. The thoracic aorta is nondilated. Maximal diameter of 3.8 cm, similar to previous. Mediastinum/Nodes: On this non IV contrast exam there is no specific abnormal lymph node enlargement seen in the axillary region right hilum. There are a few small less than 1 cm in size nodes identified in the mediastinum which are not pathologic by size criteria. These are unchanged from previous. Small thyroid gland. Normal course and caliber of the thoracic esophagus. Lungs/Pleura: Once again there are areas of lower lung bronchiectasis with scattered areas of scarring and fibrotic changes with interstitial septal thickening. Pleural thickening. In the interval there is some ill-defined bandlike opacities developing along both lower lobes. Atelectasis versus infiltrate is possible. Please correlate for clinical acute process and recommend follow-up. The small 3 mm left lower lobe nodule is again seen today on series 8, image 141. Few other tiny areas of nodularity identified. Again there are areas of peribronchial thickening as well along both lower lobes with some bronchial debris and opacification. More mild changes seen along the lingula inferiorly. Upper  Abdomen: Adrenal glands are preserved in the upper abdomen. Stones suggested in the area of the gallbladder at the edge of the imaging field Musculoskeletal: Mild degenerative changes seen along spine.  There is moderate compression of the T5 vertebral body, unchanged from prior. Likely chronic. IMPRESSION: Chronic underlying lung changes again identified with bronchiectasis and interstitial changes with scarring and fibrotic change. However there is developing parenchymal bandlike opacities along both lower lobes with bronchial wall thickening and opacity. Acute infiltrate is in the differential recommend follow-up. No associated pleural effusion. Gallstones. Scattered vascular calcifications with significant calcifications along the aortic valve and slight ectasia of the ascending aorta, similar to previous Aortic Atherosclerosis (ICD10-I70.0). Electronically Signed   By: Jill Side M.D.   On: 04/12/2022 16:51   CT Head Wo Contrast  Result Date: 04/12/2022 CLINICAL DATA:  Neuro deficit, acute, stroke suspected EXAM: CT HEAD WITHOUT CONTRAST TECHNIQUE: Contiguous axial images were obtained from the base of the skull through the vertex without intravenous contrast. RADIATION DOSE REDUCTION: This exam was performed according to the departmental dose-optimization program which includes automated exposure control, adjustment of the mA and/or kV according to patient size and/or use of iterative reconstruction technique. COMPARISON:  CT of the face 07/19/2017. FINDINGS: Brain: There is periventricular white matter decreased attenuation consistent with small vessel ischemic changes. Ventricles, sulci and cisterns are prominent consistent with age related involutional changes. No acute intracranial hemorrhage, mass effect or shift. No hydrocephalus. Vascular: No hyperdense vessel or unexpected calcification. Skull: Normal. Negative for fracture or focal lesion. Sinuses/Orbits: Mucoperiosteal thickening consistent with  chronic ethmoid and maxillary sinusitis. Status post bilateral medial ethmoidectomies. Chronic nasal fractures identified. No acute calvarial abnormalities. IMPRESSION: Atrophy and chronic small vessel ischemic changes. No acute intracranial process identified. Electronically Signed   By: Sammie Bench M.D.   On: 04/12/2022 16:51   DG Chest Port 1 View  Result Date: 04/12/2022 CLINICAL DATA:  Fever EXAM: PORTABLE CHEST 1 VIEW COMPARISON:  03/06/2020 FINDINGS: Cardiomegaly. Diffuse bilateral interstitial pulmonary opacity. The visualized skeletal structures are unremarkable. IMPRESSION: Cardiomegaly with diffuse bilateral interstitial pulmonary opacity, consistent with edema or atypical/viral infection. No focal airspace opacity. Electronically Signed   By: Delanna Ahmadi M.D.   On: 04/12/2022 13:56   ECHOCARDIOGRAM COMPLETE  Result Date: 04/11/2022    ECHOCARDIOGRAM REPORT   Patient Name:   ZACARIUS BOOR   Date of Exam: 04/11/2022 Medical Rec #:  [id number]     Height:       69.0 in Accession #:    [id number]    Weight:       150.2 lb Date of Birth:  10/24/1932      BSA:          1.829 m Patient Age:    57 years      BP:           140/74 mmHg Patient Gender: M             HR:           85 bpm. Exam Location:  [address] Procedure: 2D Echo, Cardiac Doppler and Color Doppler Indications:    I35.0 AS  History:        Patient has prior history of Echocardiogram examinations, most                 recent 07/08/2021. AS, Arrythmias:Atrial Fibrillation; Risk                 Factors:Hypertension, Dyslipidemia and Former Smoker.  Sonographer:    Coralyn Helling RDCS Referring Phys: [id number] [place]  1. Left ventricular ejection fraction, by estimation, is 55 to 60%. The left ventricle has normal  function. The left ventricle has no regional wall motion abnormalities. There is mild concentric left ventricular hypertrophy. Left ventricular diastolic parameters are indeterminate.  2. Right ventricular  systolic function is normal. The right ventricular size is normal. Tricuspid regurgitation signal is inadequate for assessing PA pressure.  3. Left atrial size was moderately dilated.  4. Right atrial size was moderately dilated.  5. The mitral valve is normal in structure. Mild mitral valve regurgitation. No evidence of mitral stenosis.  6. The aortic valve is tricuspid. There is severe calcifcation of the aortic valve. Aortic valve regurgitation is trivial. Severe aortic valve stenosis. Aortic valve area, by VTI measures 0.68 cm. Aortic valve mean gradient measures 42.5 mmHg.  7. Aortic dilatation noted. There is mild dilatation of the ascending aorta, measuring 39 mm.  8. The inferior vena cava is normal in size with greater than 50% respiratory variability, suggesting right atrial pressure of 3 mmHg.  9. The patient was in atrial fibrillation. FINDINGS  Left Ventricle: Left ventricular ejection fraction, by estimation, is 55 to 60%. The left ventricle has normal function. The left ventricle has no regional wall motion abnormalities. The left ventricular internal cavity size was normal in size. There is  mild concentric left ventricular hypertrophy. Left ventricular diastolic parameters are indeterminate. Right Ventricle: The right ventricular size is normal. No increase in right ventricular wall thickness. Right ventricular systolic function is normal. Tricuspid regurgitation signal is inadequate for assessing PA pressure. The tricuspid regurgitant velocity is 2.87 m/s, and with an assumed right atrial pressure of 3 mmHg, the estimated right ventricular systolic pressure is [id number]. Left Atrium: Left atrial size was moderately dilated. Right Atrium: Right atrial size was moderately dilated. Pericardium: There is no evidence of pericardial effusion. Mitral Valve: The mitral valve is normal in structure. Mild mitral annular calcification. Mild mitral valve regurgitation. No evidence of mitral valve stenosis.  Tricuspid Valve: The tricuspid valve is normal in structure. Tricuspid valve regurgitation is mild. Aortic Valve: The aortic valve is tricuspid. There is severe calcifcation of the aortic valve. Aortic valve regurgitation is trivial. Severe aortic stenosis is present. Aortic valve mean gradient measures 42.5 mmHg. Aortic valve peak gradient measures 70.0 mmHg. Aortic valve area, by VTI measures 0.68 cm. Pulmonic Valve: The pulmonic valve was normal in structure. Pulmonic valve regurgitation is trivial. Aorta: The ascending aorta was not well visualized and aortic dilatation noted. There is mild dilatation of the ascending aorta, measuring 39 mm. Venous: The inferior vena cava is normal in size with greater than 50% respiratory variability, suggesting right atrial pressure of 3 mmHg. IAS/Shunts: No atrial level shunt detected by color flow Doppler.  LEFT VENTRICLE PLAX 2D LVIDd:         4.30 cm LVIDs:         2.70 cm LV PW:         1.20 cm LV IVS:        1.50 cm LVOT diam:     2.20 cm LV SV:         55 LV SV Index:   30 LVOT Area:     3.80 cm  RIGHT VENTRICLE            IVC RVSP:           35.9 mmHg  IVC diam: 0.90 cm LEFT ATRIUM             Index        RIGHT ATRIUM  Index LA diam:        4.80 cm 2.62 cm/m   RA Pressure: 3.00 mmHg LA Vol (A2C):   81.1 ml 44.33 ml/m  RA Area:     29.70 cm LA Vol (A4C):   80.4 ml 43.95 ml/m  RA Volume:   107.00 ml 58.49 ml/m LA Biplane Vol: 85.2 ml 46.58 ml/m  AORTIC VALVE AV Area (Vmax):    0.64 cm AV Area (Vmean):   0.63 cm AV Area (VTI):     0.68 cm AV Vmax:           418.40 cm/s AV Vmean:          280.000 cm/s AV VTI:            0.799 m AV Peak Grad:      70.0 mmHg AV Mean Grad:      42.5 mmHg LVOT Vmax:         70.94 cm/s LVOT Vmean:        46.460 cm/s LVOT VTI:          0.144 m LVOT/AV VTI ratio: 0.18  AORTA Ao Root diam: 3.70 cm Ao Asc diam:  3.90 cm MITRAL VALVE               TRICUSPID VALVE MV Area (PHT): 4.91 cm    TR Peak grad:   32.9 mmHg MV Decel  Time: 155 msec    TR Vmax:        287.00 cm/s MV E velocity: 96.00 cm/s  Estimated RAP:  3.00 mmHg                            RVSP:           35.9 mmHg                             SHUNTS                            Systemic VTI:  0.14 m                            Systemic Diam: 2.20 cm Dalton McleanMD Electronically signed by Franki Monte Signature Date/Time: 04/11/2022/1:37:52 PM    Final      PERTINENT LAB RESULTS: CBC: Recent Labs    04/12/22 1303 04/13/22 0301  WBC 7.6 7.9  HGB 12.3* 12.7*  HCT 36.1* 36.4*  PLT 122* 128*   CMET CMP     Component Value Date/Time   NA 130 (L) 04/13/2022 0301   K 3.8 04/13/2022 0301   CL 96 (L) 04/13/2022 0301   CO2 20 (L) 04/13/2022 0301   GLUCOSE 112 (H) 04/13/2022 0301   BUN 15 04/13/2022 0301   CREATININE 1.33 (H) 04/13/2022 0301   CALCIUM 8.7 (L) 04/13/2022 0301   PROT 6.4 (L) 04/12/2022 1303   PROT 6.7 06/22/2021 1205   ALBUMIN 3.4 (L) 04/12/2022 1303   AST 28 04/12/2022 1303   ALT 22 04/12/2022 1303   ALKPHOS 60 04/12/2022 1303   BILITOT 0.6 04/12/2022 1303   GFRNONAA 52 (L) 04/13/2022 0301   GFRAA 55 (L) 09/16/2019 1120    GFR Estimated Creatinine Clearance: 38.9 mL/min (A) (by C-G formula based on SCr of 1.33 mg/dL (H)). No results for input(s): "LIPASE", "AMYLASE" in  the last 72 hours. Recent Labs    04/12/22 1530  CKTOTAL 474*   Invalid input(s): "POCBNP" No results for input(s): "DDIMER" in the last 72 hours. No results for input(s): "HGBA1C" in the last 72 hours. No results for input(s): "CHOL", "HDL", "LDLCALC", "TRIG", "CHOLHDL", "LDLDIRECT" in the last 72 hours. No results for input(s): "TSH", "T4TOTAL", "T3FREE", "THYROIDAB" in the last 72 hours.  Invalid input(s): "FREET3" No results for input(s): "VITAMINB12", "FOLATE", "FERRITIN", "TIBC", "IRON", "RETICCTPCT" in the last 72 hours. Coags: Recent Labs    04/12/22 1303  INR 1.4*   Microbiology: Recent Results (from the past 240 hour(s))  Resp panel by  RT-PCR (RSV, Flu A&B, Covid) Anterior Nasal Swab     Status: None   Collection Time: 04/12/22  1:30 PM   Specimen: Anterior Nasal Swab  Result Value Ref Range Status   SARS Coronavirus 2 by RT PCR NEGATIVE NEGATIVE Final   Influenza A by PCR NEGATIVE NEGATIVE Final   Influenza B by PCR NEGATIVE NEGATIVE Final    Comment: (NOTE) The Xpert Xpress SARS-CoV-2/FLU/RSV plus assay is intended as an aid in the diagnosis of influenza from Nasopharyngeal swab specimens and should not be used as a sole basis for treatment. Nasal washings and aspirates are unacceptable for Xpert Xpress SARS-CoV-2/FLU/RSV testing.  Fact Sheet for Patients: EntrepreneurPulse.com.au  Fact Sheet for Healthcare Providers: IncredibleEmployment.be  This test is not yet approved or cleared by the [place] FDA and has been authorized for detection and/or diagnosis of SARS-CoV-2 by FDA under an Emergency Use Authorization (EUA). This EUA will remain in effect (meaning this test can be used) for the duration of the COVID-19 declaration under Section 564(b)(1) of the Act, 21 U.S.C. section 360bbb-3(b)(1), unless the authorization is terminated or revoked.     Resp Syncytial Virus by PCR NEGATIVE NEGATIVE Final    Comment: (NOTE) Fact Sheet for Patients: EntrepreneurPulse.com.au  Fact Sheet for Healthcare Providers: IncredibleEmployment.be  This test is not yet approved or cleared by the [place] FDA and has been authorized for detection and/or diagnosis of SARS-CoV-2 by FDA under an Emergency Use Authorization (EUA). This EUA will remain in effect (meaning this test can be used) for the duration of the COVID-19 declaration under Section 564(b)(1) of the Act, 21 U.S.C. section 360bbb-3(b)(1), unless the authorization is terminated or revoked.  Performed at [facility], [place] [address] [place], [place] [address]     FURTHER  DISCHARGE INSTRUCTIONS:  Get Medicines reviewed and adjusted: Please take all your medications with you for your next visit with your Primary MD  Laboratory/radiological data: Please request your Primary MD to go over all hospital tests and procedure/radiological results at the follow up, please ask your Primary MD to get all Hospital records sent to his/her office.  In some cases, they will be blood work, cultures and biopsy results pending at the time of your discharge. Please request that your primary care M.D. goes through all the records of your hospital data and follows up on these results.  Also Note the following: If you experience worsening of your admission symptoms, develop shortness of breath, life threatening emergency, suicidal or homicidal thoughts you must seek medical attention immediately by calling 911 or calling your MD immediately  if symptoms less severe.  You must read complete instructions/literature along with all the possible adverse reactions/side effects for all the Medicines you take and that have been prescribed to you. Take any new Medicines after you have completely understood and  accpet all the possible adverse reactions/side effects.   Do not drive when taking Pain medications or sleeping medications (Benzodaizepines)  Do not take more than prescribed Pain, Sleep and Anxiety Medications. It is not advisable to combine anxiety,sleep and pain medications without talking with your primary care practitioner  Special Instructions: If you have smoked or chewed Tobacco  in the last 2 yrs please stop smoking, stop any regular Alcohol  and or any Recreational drug use.  Wear Seat belts while driving.  Please note: You were cared for by a hospitalist during your hospital stay. Once you are discharged, your primary care physician will handle any further medical issues. Please note that NO REFILLS for any discharge medications will be authorized once you are discharged,  as it is imperative that you return to your primary care physician (or establish a relationship with a primary care physician if you do not have one) for your post hospital discharge needs so that they can reassess your need for medications and monitor your lab values.  Total Time spent coordinating discharge including counseling, education and face to face time equals greater than 30 minutes.  SignedOren Binet 04/13/2022 11:24 AM

## 2022-04-13 NOTE — Consult Note (Signed)
NAME:  Chad Morrison, MRN:  [id number], DOB:  1932/09/03, LOS: 1 ADMISSION DATE:  04/12/2022, CONSULTATION DATE: 04/13/2021 REFERRING MD: [facility] , CHIEF COMPLAINT: Lower extremity weakness  History of Present Illness:  [name] is a 89 year old male who is followed by Dr. Rivka Safer for severe asthma, acid reflux, LPR, atrial fibrillation, aortic stenosis his past medical history is notable for but not limited to granulomatosis along with Wegener's polyangiitis which she is followed for at [facility] and has been taken off steroids and is now on [place] with adequate control.  He was in his usual state of health until approximately 3 days prior to admission in which time he was notable for increasing weakness which led to inability to get off the toilet on 04/12/2022.  Did not have a subjective fever but on admission he had a temperature of 103.4.  He has  been treated for pneumonia due to CT scan with bilateral lower lobe banding questionable pneumonia.  He denies purulent sputum any exacerbation of his asthma symptoms or increasing shortness of breath.  He does endorse feeling better after 24 hours of antibiotic therapy.  Will have Dr. Vaughan Browner evaluate this patient but the [facility] hospitalist team is doing a great job. Pertinent  Medical History   Past Medical History:  Diagnosis Date   Anxiety    Asthma    Asthmatic bronchitis    Atrial flutter (HCC)    chads2vasc score is 3   Ehrlichiosis [id number]   GERD (gastroesophageal reflux disease)    Granulomatosis with polyangiitis (HCC)    HTN (hypertension)    Hyperlipidemia    Nuclear sclerotic cataract of left eye 06/04/2019   Cataract surgery completed March 2023, Dr. [name]   Paroxysmal atrial fibrillation [facility])    Prostate cancer ([place])    Seasonal allergies      Significant Hospital Events: Including procedures, antibiotic start and stop dates in addition to other pertinent events     Interim History / Subjective:  Reports being  better.  Objective   Blood pressure 118/74, pulse 91, temperature 99.5 F (37.5 C), resp. rate 16, height 5' 9"$  (1.753 m), weight 70.3 kg, SpO2 96 %.        Intake/Output Summary (Last 24 hours) at 04/13/2022 [id number] Last data filed at 04/13/2022 0800 Gross per 24 hour  Intake 220 ml  Output --  Net 220 ml   Filed Weights   04/12/22 1259  Weight: 70.3 kg    Examination: General: 89 year old male who looks surprisingly healthy at time of evaluation HENT: No JVD or lymphadenopathy is appreciated Lungs: No wheezing no rhonchi diminished in the bases Cardiovascular: Heart sounds are distant Abdomen: Abdomen is soft nontender Extremities: Right foot great toe with deformity negative edema Neuro: Grossly intact without focal defect extremity sharp for age GU: [facility] Problem list     Assessment & Plan:  89 year old male with extensive pulmonary history that includes but not limited to, asthma, interstitial lung disease, bronchiectasis, Wegener's disease for which she has been on steroids and methotrexate and is also noted for granulomatosis, polyangiitis.  He has been weaned off methotrexate and prednisone.  He is followed by Dr. Neldon Mc of allergies and was started on faserna.  He presented to [facility] emergency department 04/12/2022 with unable to get off the toilet.  This is due to lower extremity weakness.  He denies any pulmonary issues other than fever.  Pulmonary critical care asked to evaluate due to the complexity of his  respiratory illnesses. COPD Agree with current therapy Agree with Zithromax and Rocephin Agree with Dulera Singulair as needed Ventolin Astelin nasal spray Repeat CT chest in 2 to 3 weeks Follow-up with Dr. Vaughan Browner as an outpatient Prostate cancer Anxiety  All other fish issues per [facility]  Macular edema right eye Atrial flutter Gastroesophageal reflux disease  Best Practice (right click and "Reselect all SmartList Selections" daily)    Diet/type: Regular consistency (see orders) DVT prophylaxis:  GI prophylaxis: PPI Lines: N/A Foley:  N/A Code Status:  full code Last date of multidisciplinary goals of care discussion [tbd]  Labs   CBC: Recent Labs  Lab 04/12/22 1303 04/13/22 0301  WBC 7.6 7.9  NEUTROABS 6.0  --   HGB 12.3* 12.7*  HCT 36.1* 36.4*  MCV 85.1 84.1  PLT 122* 128*    Basic Metabolic Panel: Recent Labs  Lab 04/12/22 1303 04/13/22 0301  NA 130* 130*  K 3.6 3.8  CL 100 96*  CO2 22 20*  GLUCOSE 110* 112*  BUN 16 15  CREATININE 1.19 1.33*  CALCIUM 8.5* 8.7*  MG 1.7  --    GFR: Estimated Creatinine Clearance: 38.9 mL/min (A) (by C-G formula based on SCr of 1.33 mg/dL (H)). Recent Labs  Lab 04/12/22 1303 04/12/22 1328 04/13/22 0301  WBC 7.6  --  7.9  LATICACIDVEN  --  0.8  --     Liver Function Tests: Recent Labs  Lab 04/12/22 1303  AST 28  ALT 22  ALKPHOS 60  BILITOT 0.6  PROT 6.4*  ALBUMIN 3.4*   No results for input(s): "LIPASE", "AMYLASE" in the last 168 hours. Recent Labs  Lab 04/12/22 1328  AMMONIA <10    ABG    Component Value Date/Time   HCO3 22.4 04/12/2022 2041   ACIDBASEDEF 2.2 (H) 04/12/2022 2041   O2SAT 26.3 04/12/2022 2041     Coagulation Profile: Recent Labs  Lab 04/12/22 1303  INR 1.4*    Cardiac Enzymes: Recent Labs  Lab 04/12/22 1530  CKTOTAL 474*    HbA1C: No results found for: "HGBA1C"  CBG: Recent Labs  Lab 04/12/22 1348  GLUCAP 97    Review of Systems:   10 point review of system taken, please see HPI for positives and negatives. Reports being weak over a period of 3 days.  Reports unable to get off commode 04/12/2022.  Denies sputum production denies cough denies wheezing he does note a fever of 103 on day of admission.  Past Medical History:  He,  has a past medical history of Anxiety, Asthma, Asthmatic bronchitis, Atrial flutter ([place]), Ehrlichiosis ([id number]), GERD (gastroesophageal reflux disease), Granulomatosis with  polyangiitis ([place]), HTN (hypertension), Hyperlipidemia, Nuclear sclerotic cataract of left eye (06/04/2019), Paroxysmal atrial fibrillation ([place]), Prostate cancer ([place]), and Seasonal allergies.   Surgical History:   Past Surgical History:  Procedure Laterality Date   BASAL CELL CARCINOMA EXCISION  2013   EYELID   HERNIA REPAIR Bilateral 2000, 2002   KNEE ARTHROSCOPY  2000   prostectomy  [place]     Social History:   reports that he quit smoking about 61 years ago. His smoking use included cigarettes. He has a 2.00 pack-year smoking history. He has never used smokeless tobacco. He reports current alcohol use of about 2.0 - 3.0 standard drinks of alcohol per week. He reports that he does not use drugs.   Family History:  His family history includes Allergies in his mother; Asthma (age of  onset: 31) in his mother; CVA (age of onset: 53) in his father; Diabetes Mellitus I in his son; Heart Problems in his father; Migraines in his son; Other in his father; Other (age of onset: 41) in his brother; Ulcers in his mother.   Allergies Allergies  Allergen Reactions   Other Other (See Comments)    Latex Gloves   Penicillins Hives    Has patient had a PCN reaction causing immediate rash, facial/tongue/throat swelling, SOB or lightheadedness with hypotension: No Has patient had a PCN reaction causing severe rash involving mucus membranes or skin necrosis: No Has patient had a PCN reaction that required hospitalization: No Has patient had a PCN reaction occurring within the last 10 years: Yes If all of the above answers are "NO", then may proceed with Cephalosporin use.    Sulfa Antibiotics Other (See Comments)    Unknown to patient    Sulfamethoxazole-Trimethoprim Other (See Comments)   Latex Hives and Rash     Home Medications  Prior to Admission medications   Medication Sig Start Date End Date Taking? Authorizing Provider  albuterol (VENTOLIN HFA) 108 (90  Base) MCG/ACT inhaler Inhale 2 puffs into the lungs every 4 (four) hours as needed for wheezing or shortness of breath. 12/21/21  Yes Kozlow, Donnamarie Poag, MD  ALPRAZolam Duanne Moron) 0.25 MG tablet Take 0.25 mg by mouth at bedtime as needed for sleep.    Yes [provider]  Ascorbic Acid (VITAMIN C PO) Take 1 tablet by mouth every morning. Not sure the dosage   Yes [provider]  azelastine (ASTELIN) 0.1 % nasal spray Place 1 spray into both nostrils 2 (two) times daily. 03/31/21  Yes [provider]  Benralizumab (FASENRA [place]) Inject into the skin every 8 (eight) weeks.   Yes [provider]  budesonide-formoterol (SYMBICORT) 160-4.5 MCG/ACT inhaler Inhale 2 puffs into the lungs 2 (two) times daily. 03/16/22  Yes Kozlow, Donnamarie Poag, MD  Calcium Carbonate-Vitamin D (CALCIUM-VITAMIN D) 600-125 MG-UNIT TABS Take 1 tablet by mouth 2 (two) times daily.    Yes [provider]  diltiazem (CARDIZEM CD) 180 MG 24 hr capsule TAKE 1 CAPSULE EVERY       MORNING AND AT BEDTIME Patient taking differently: Take 180 mg by mouth 2 (two) times daily. 12/06/21  Yes Fenton, Clint R, PA  ELIQUIS 5 MG TABS tablet TAKE 1 TABLET TWICE A DAY Patient taking differently: Take 5 mg by mouth 2 (two) times daily. 02/23/21  Yes Fenton, Clint R, PA  EPINEPHrine 0.3 mg/0.3 mL IJ SOAJ injection SMARTSIG:0.3 Milligram(s) IM Once Patient taking differently: Inject 0.3 mg into the muscle as needed for anaphylaxis. SMARTSIG:0.3 Milligram(s) IM Once 06/22/21  Yes Kozlow, Donnamarie Poag, MD  Ferrous Sulfate (SLOW FE PO) Take 1 tablet by mouth every morning.   Yes [provider]  ketorolac (ACULAR) 0.5 % ophthalmic solution Place 1 drop into the right eye 3 (three) times daily. 10/28/21 10/28/22 Yes Rankin, Clent Demark, MD  levocetirizine (XYZAL) 5 MG tablet TAKE 1 TABLET EVERY EVENING 02/25/22  Yes Kozlow, Donnamarie Poag, MD  levothyroxine (SYNTHROID, LEVOTHROID) 75 MCG tablet Take 75 mcg by mouth daily before breakfast.    Yes [provider]  montelukast (SINGULAIR) 10 MG tablet Take 1 tablet (10 mg total) by mouth at bedtime. 12/21/21  Yes Kozlow, Donnamarie Poag, MD  Multiple Vitamin (MULTIVITAMIN WITH MINERALS) TABS tablet Take 1 tablet daily by mouth.   Yes [provider]  Multiple Vitamins-Minerals (OCUVITE PRESERVISION  PO) Take 1 tablet 2 (two) times daily by mouth.   Yes [provider]  Polyethyl Glycol-Propyl Glycol (SYSTANE OP) Place 1 drop into both eyes every morning.   Yes [provider]  rosuvastatin (CRESTOR) 5 MG tablet TAKE 1 TABLET DAILY 12/06/21  Yes Early Osmond, MD  brimonidine (ALPHAGAN) 0.15 % ophthalmic solution INSTILL 1 DROP IN RIGHT EYE EVERY 8 HOURS Patient not taking: Reported on 04/12/2022 12/20/21   Rankin, Clent Demark, MD  budesonide-formoterol [facility]) 160-4.5 MCG/ACT inhaler Inhale 2 puffs into the lungs in the morning and at bedtime. 03/21/22   Kozlow, Donnamarie Poag, MD  esomeprazole (NEXIUM) 40 MG capsule Take 1 capsule (40 mg total) by mouth 2 (two) times daily. Patient not taking: Reported on 04/12/2022 12/21/21   Jiles Prows, MD  loteprednol (LOTEMAX) 0.5 % ophthalmic suspension Place 1 drop into the right eye daily. Patient not taking: Reported on 04/12/2022 09/30/21 09/30/22  Hurman Horn, MD     Critical care time: Ferol Luz Jaceyon Strole ACNP Acute Care Nurse Practitioner [place] [place] Please consult Amion 04/13/2022, 9:24 AM

## 2022-04-13 NOTE — Evaluation (Signed)
Physical Therapy Evaluation Patient Details Name: Chad Morrison MRN: [id number] DOB: 23-Aug-1932 Today's Date: 04/13/2022  History of Present Illness  89 y/o M admitted to [facility] on 2/13 for fever, SOB, and L leg pain diagnosed with PNA. PMHx: moderate asthma, COPD, a-fib on Eliquis, HTN, severe aortic stenosis.  Clinical Impression  Pt presents today functioning close to his mobility baseline. Pt admitted for LLE pain and SOB, however pt reporting resolution of most symptoms. BLE strength and sensation appearing equal with no complaints of LLE pain. Pt ambulating with supervision for safety and stair trial with minG for safety. Pt cued once during ambulation for decrease in gait speed, noted mild path deviation, resolving with decreasing gait speed although pt reports he ambulates swiftly at home. Pt reports no concerns with mobility at this time or upon discharge, no further benefit from skilled acute PT, no current need for PT upon discharge. All questions answered from pt and encouraged to continue to mobilize during this admission as able. PT signing off.      Recommendations for follow up therapy are one component of a multi-disciplinary discharge planning process, led by the attending physician.  Recommendations may be updated based on patient status, additional functional criteria and insurance authorization.  Follow Up Recommendations No PT follow up      Assistance Recommended at Discharge Intermittent Supervision/Assistance  Patient can return home with the following  Help with stairs or ramp for entrance    Equipment Recommendations None recommended by PT  Recommendations for Other Services       Functional Status Assessment Patient has had a recent decline in their functional status and demonstrates the ability to make significant improvements in function in a reasonable and predictable amount of time.     Precautions / Restrictions Precautions Precautions:  Fall Restrictions Weight Bearing Restrictions: No      Mobility  Bed Mobility Overal bed mobility: Modified Independent             General bed mobility comments: HOB elevated and use of side rails but performing without difficulty    Transfers Overall transfer level: Needs assistance Equipment used: None Transfers: Sit to/from Stand Sit to Stand: Supervision           General transfer comment: supervision provided for safety    Ambulation/Gait Ambulation/Gait assistance: Supervision Gait Distance (Feet): 200 Feet Assistive device: None Gait Pattern/deviations: Step-through pattern, Decreased step length - left, Drifts right/left, Knee flexed in stance - left, Antalgic Gait velocity: WFL     General Gait Details: increased gait speed with mildly antalgic gait on LLE although pt denying pain, noted genu varum present with mildly flexed knee in stance phase on LLE  Stairs Stairs: Yes Stairs assistance: Min guard Stair Management: One rail Right, Alternating pattern, Step to pattern, Forwards, Sideways Number of Stairs: 6 General stair comments: alternating pattern going forwards ascending stairs, sideways step to pattern for descending  Wheelchair Mobility    Modified Rankin (Stroke Patients Only)       Balance Overall balance assessment: No apparent balance deficits (not formally assessed)                                           Pertinent Vitals/Pain Pain Assessment Pain Assessment: No/denies pain    Home Living Family/patient expects to be discharged to:: Private residence Living Arrangements: Spouse/significant other Available Help at Discharge:  Family;Available 24 hours/day Type of Home: House Home Access: Stairs to enter Entrance Stairs-Rails: Right;Left Entrance Stairs-Number of Steps: 6 Alternate Level Stairs-Number of Steps: flight but pt stays on main level Home Layout: Two level;Able to live on main level with  bedroom/bathroom Home Equipment: None      Prior Function Prior Level of Function : Independent/Modified Independent;Driving             Mobility Comments: Pt independent with mobility at baseline, no AD, drives, reports walking a mile a couple times a week and going to the gym       Hand Dominance        Extremity/Trunk Assessment   Upper Extremity Assessment Upper Extremity Assessment: Overall WFL for tasks assessed    Lower Extremity Assessment Lower Extremity Assessment: Overall WFL for tasks assessed    Cervical / Trunk Assessment Cervical / Trunk Assessment: Normal  Communication   Communication: No difficulties  Cognition Arousal/Alertness: Awake/alert Behavior During Therapy: WFL for tasks assessed/performed Overall Cognitive Status: Within Functional Limits for tasks assessed                                 General Comments: A&Ox4, pleasant throughout session        General Comments General comments (skin integrity, edema, etc.): VSS on room air    Exercises     Assessment/Plan    PT Assessment Patient does not need any further PT services  PT Problem List         PT Treatment Interventions      PT Goals (Current goals can be found in the Care Plan section)  Acute Rehab PT Goals Patient Stated Goal: go home PT Goal Formulation: All assessment and education complete, DC therapy Potential to Achieve Goals: Good    Frequency       Co-evaluation               AM-PAC PT "6 Clicks" Mobility  Outcome Measure Help needed turning from your back to your side while in a flat bed without using bedrails?: None Help needed moving from lying on your back to sitting on the side of a flat bed without using bedrails?: None Help needed moving to and from a bed to a chair (including a wheelchair)?: None Help needed standing up from a chair using your arms (e.g., wheelchair or bedside chair)?: None Help needed to walk in hospital  room?: None Help needed climbing 3-5 steps with a railing? : A Little 6 Click Score: 23    End of Session Equipment Utilized During Treatment: Gait belt Activity Tolerance: Patient tolerated treatment well Patient left: in bed;with call bell/phone within reach;with bed alarm set Nurse Communication: Mobility status PT Visit Diagnosis: Difficulty in walking, not elsewhere classified (R26.2)    Time: [id number] PT Time Calculation (min) (ACUTE ONLY): 19 min   Charges:   PT Evaluation $PT Eval Low Complexity: 1 Low          Charlynne Cousins, PT DPT Acute Rehabilitation Services Office [phone]   Luvenia Heller 04/13/2022, 12:43 PM

## 2022-04-14 ENCOUNTER — Telehealth: Payer: Self-pay | Admitting: Internal Medicine

## 2022-04-14 LAB — MYCOPLASMA PNEUMONIAE ANTIBODY, IGM: Mycoplasma pneumo IgM: 2684 U/mL — ABNORMAL HIGH (ref 0–769)

## 2022-04-14 NOTE — Telephone Encounter (Signed)
Wife calling in to say that the patient has been in the hospital. and would like for the dr to review patient chart info from the hospital. Please advise

## 2022-04-15 ENCOUNTER — Ambulatory Visit: Payer: Medicare Other | Admitting: Podiatry

## 2022-04-15 ENCOUNTER — Telehealth: Payer: Self-pay | Admitting: Pulmonary Disease

## 2022-04-15 NOTE — Telephone Encounter (Signed)
I was able to patient scheduled for hospital f/u on 05/17/22 this is about 5wks instead of 4 weeks. There was nothing sooner. I did offer patient a sooner appointment with someone else patient only wanted to see Dr. Vaughan Browner. Wife wants to make sure this wont be long for a f/u. Dr. Vaughan Browner can you please advise if this appointment is okay?

## 2022-04-15 NOTE — Telephone Encounter (Signed)
Called and spoke with Benjamine Mola (patient wife).  [name] requested follow up in 2 weeks, if possible.  Scheduled patient 05/06/22 for hospital follow up.

## 2022-04-15 NOTE — Telephone Encounter (Signed)
Called and spoke with patient's spouse Benjamine Mola.  Patient is currently at follow up [place] stated she would call later today to schedule hospital follow up with Dr. Vaughan Browner. Will follow up.  Message from Dr. Vaughan Browner 2/16/24Marshell Garfinkel, MD  P Lb[facility] Triage Pool; Elton Sin, LPN Can you make a follow up with me in clinic in 4 weeks. Thanks

## 2022-04-20 DIAGNOSIS — J4540 Moderate persistent asthma, uncomplicated: Secondary | ICD-10-CM | POA: Diagnosis not present

## 2022-04-20 DIAGNOSIS — M3130 Wegener's granulomatosis without renal involvement: Secondary | ICD-10-CM | POA: Diagnosis not present

## 2022-04-20 DIAGNOSIS — J302 Other seasonal allergic rhinitis: Secondary | ICD-10-CM | POA: Diagnosis not present

## 2022-04-20 DIAGNOSIS — J181 Lobar pneumonia, unspecified organism: Secondary | ICD-10-CM | POA: Diagnosis not present

## 2022-04-20 DIAGNOSIS — I700 Atherosclerosis of aorta: Secondary | ICD-10-CM | POA: Diagnosis not present

## 2022-04-20 DIAGNOSIS — D8989 Other specified disorders involving the immune mechanism, not elsewhere classified: Secondary | ICD-10-CM | POA: Diagnosis not present

## 2022-04-20 DIAGNOSIS — J849 Interstitial pulmonary disease, unspecified: Secondary | ICD-10-CM | POA: Diagnosis not present

## 2022-04-20 NOTE — Progress Notes (Signed)
Patient ID: Chad Morrison MRN: [id number] DOB/AGE: Jun 22, 1932 89 y.o.  Primary Care Physician:Tisovec, Fransico Him, MD Primary Cardiologist: Delilah Shan CARDIOVASCULAR PROBLEM LIST:   1.  Severe aortic stenosis with an aortic valve area 0.68 cm grade and mean gradient of 42 mmHg with preserved ejection fraction: atrial fibrillation with right bundle branch block 2.  Atrial fibrillation on anticoagulation 3.  Hypertension 4.  Hyperlipidemia 5.  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 male with the indicated medical history here for recommendations regarding his severe aortic stenosis seen on echocardiogram.  The patient was referred by nurse practitioner Caroll in the atrial fibrillation clinic the patient was seen in April of this year and was doing relatively well.   The patient is here with his wife.  He is relatively active.  He exercises at the [facility] on a regular basis using a stationary bike.  He denies any exertional dyspnea, exertional angina, presyncope or syncope.  His atrial fibrillation has not really bother him whatsoever.  He has not required emergency room visits or hospitalizations recently for any breathing difficulties.  He has had no issues with bleeding or bruising while on Eliquis.  In terms of other symptoms his Wegener's granulomatosis is very well controlled on immunotherapy.   He is been retired for a long time.  He moved to [place] some years ago because his son is here.  He used to live in [place] since the 90s.  He does not smoke or drink.  He sees a Pharmacist, community on a regular basis and reports very good dental health.  Plan:  Follow up in 3 months.  August 2023:  The patient returns for 49-monthfollow-up.  The patient was seen in April regarding his aortic valvular disease.  At that point in time he was he was doing relatively well and was asymptomatic with regards to  his aortic stenosis.  The patient is here with his wife.  He continues to be very active.  [place] [employer] [place] a regular basis.  When he goes up hills he might have to stop but this is relatively unchanged versus prior.  He has required no emergency room visits or hospitalizations.  He denies any exertional angina, presyncope or syncope.  His biggest issue recently was left eye pain and this is being actively managed by ophthalmology.  Plan:  Monitor for now, follow up 3 months.  Today: In the interim since I saw him last an echocardiogram was done which demonstrated progression to severe aortic stenosis with a preserved ejection fraction.  He was recently admitted to the hospital for probable pneumonia and treated with antibiotics.  He was in the hospital for 1 night.  He is about 12 days out from his hospitalization.  He is feeling better but not back up to speed.  He is still able to [place] and [facility] only slight shortness of breath.  He does not believe that this is too limiting.  He denies any presyncope, syncope, or chest pain.  Past Medical History:  Diagnosis Date   Anxiety    Asthma    Asthmatic bronchitis    Atrial flutter ([facility]    chads2vasc score is 3   Ehrlichiosis [id number]  GERD (gastroesophageal reflux disease)    Granulomatosis with polyangiitis (HCC)    HTN (hypertension)    Hyperlipidemia  Nuclear sclerotic cataract of left eye 06/04/2019   Cataract surgery completed March 2023, Dr. [name]   Paroxysmal atrial fibrillation [facility])    Prostate cancer ([place])    Seasonal allergies     Past Surgical History:  Procedure Laterality Date   BASAL CELL CARCINOMA EXCISION  2013   EYELID   HERNIA REPAIR Bilateral 2000, 2002   KNEE ARTHROSCOPY  2000   prostectomy  [place]    Family History  Problem Relation Age of Onset   Asthma Mother 52   Allergies Mother    Ulcers Mother    CVA Father 70   Heart Problems  Father    Other Father        NEUROLOGIC ISSUES    Other Brother 13       BICYCLE ACCIDENT   Migraines Son    Diabetes Mellitus I Son     Social History   Socioeconomic History   Marital status: Married    Spouse name: [name]   Number of children: 2   Years of education: Not on file   Highest education level: Not on file  Occupational History   Occupation: RETIRED  Tobacco Use   Smoking status: Former    Packs/day: 0.50    Years: 4.00    Total pack years: 2.00    Types: Cigarettes    Quit date: 02/28/1961    Years since quitting: 61.1   Smokeless tobacco: Never  Vaping Use   Vaping Use: Never used  Substance and Sexual Activity   Alcohol use: Yes    Alcohol/week: 2.0 - 3.0 standard drinks of alcohol    Types: 2 - 3 Standard drinks or equivalent per week   Drug use: No   Sexual activity: Not on file  Other Topics Concern   Not on file  Social History Narrative   Pt recently moved to [place] from [place] to be near his son.   Retired.   Social Determinants of Health   Financial Resource Strain: Not on file  Food Insecurity: No Food Insecurity (04/12/2022)   Hunger Vital Sign    Worried About Running Out of Food in the Last Year: Never true    Ran Out of Food in the Last Year: Never true  Transportation Needs: No Transportation Needs (04/12/2022)   PRAPARE - Hydrologist (Medical): No    Lack of Transportation (Non-Medical): No  Physical Activity: Not on file  Stress: Not on file  Social Connections: Not on file  Intimate Partner Violence: Not At Risk (04/12/2022)   Humiliation, Afraid, Rape, and Kick questionnaire    Fear of Current or Ex-Partner: No    Emotionally Abused: No    Physically Abused: No    Sexually Abused: No     Prior to Admission medications   Medication Sig Start Date End Date Taking? Authorizing Provider  albuterol (VENTOLIN HFA) 108 (90 Base) MCG/ACT inhaler Inhale 2 puffs into the lungs every 4 (four)  hours as needed for wheezing or shortness of breath. 06/22/21   Kozlow, Donnamarie Poag, MD  ALPRAZolam Duanne Moron) 0.25 MG tablet Take 0.25 mg by mouth at bedtime as needed for sleep.     [provider]  Ascorbic Acid (VITAMIN C PO) Take 1 tablet by mouth every morning. Not sure the dosage    [provider]  azelastine (ASTELIN) 0.1 % nasal spray Place 1 spray into both nostrils 2 (two) times daily.  03/31/21   [provider]  budesonide-formoterol (SYMBICORT) 160-4.5 MCG/ACT inhaler Inhale 2 puffs into the lungs in the morning and at bedtime. 06/22/21   Kozlow, Donnamarie Poag, MD  Calcium Carbonate-Vitamin D (CALCIUM-VITAMIN D) 600-125 MG-UNIT TABS Take 1 tablet by mouth 2 (two) times daily.     [provider]  diltiazem (CARDIZEM CD) 180 MG 24 hr capsule TAKE 1 CAPSULE EVERY       MORNING AND AT BEDTIME 02/23/21   Fenton, Clint R, PA  ELIQUIS 5 MG TABS tablet TAKE 1 TABLET TWICE A DAY 02/23/21   Fenton, Clint R, PA  EPINEPHrine 0.3 mg/0.3 mL IJ SOAJ injection SMARTSIG:0.3 Milligram(s) IM Once 06/22/21   Kozlow, Donnamarie Poag, MD  esomeprazole (NEXIUM) 40 MG capsule Take 1 capsule (40 mg total) by mouth 2 (two) times daily. 06/22/21   Kozlow, Donnamarie Poag, MD  Ferrous Sulfate (SLOW FE PO) Take 1 tablet by mouth every morning.    [provider]  fluorouracil (EFUDEX) 5 % cream SMARTSIG:sparingly Topical Twice Daily 05/18/21   [provider]  levocetirizine (XYZAL) 5 MG tablet Take 1 tablet (5 mg total) by mouth 2 (two) times daily as needed for allergies (Can take an extra dose during flare ups.). 06/22/21   Kozlow, Donnamarie Poag, MD  levothyroxine (SYNTHROID, LEVOTHROID) 75 MCG tablet Take 75 mcg by mouth daily before breakfast.    [provider]  montelukast (SINGULAIR) 10 MG tablet Take 1 tablet (10 mg total) by mouth at bedtime. 06/22/21   Kozlow, Donnamarie Poag, MD  Multiple Vitamin (MULTIVITAMIN WITH MINERALS) TABS tablet Take 1 tablet daily by mouth.    [provider]   Multiple Vitamins-Minerals (OCUVITE PRESERVISION PO) Take 1 tablet 2 (two) times daily by mouth.    [provider]  Polyethyl Glycol-Propyl Glycol (SYSTANE OP) Apply 1 drop to eye every morning.    [provider]  PROLENSA 0.07 % SOLN INSTILL 1 DROP INTO THE    RIGHT EYE DAILY 07/19/21   Rankin, Clent Demark, MD  Propylene Glycol 0.6 % SOLN Apply 1 drop to eye in the morning.    [provider]  rosuvastatin (CRESTOR) 5 MG tablet TAKE 1 TABLET DAILY 01/19/21   Fenton, Clint R, PA    Allergies  Allergen Reactions   Other Other (See Comments)    Latex Gloves   Penicillins Hives    Has patient had a PCN reaction causing immediate rash, facial/tongue/throat swelling, SOB or lightheadedness with hypotension: No Has patient had a PCN reaction causing severe rash involving mucus membranes or skin necrosis: No Has patient had a PCN reaction that required hospitalization: No Has patient had a PCN reaction occurring within the last 10 years: Yes If all of the above answers are "NO", then may proceed with Cephalosporin use.    Sulfa Antibiotics Other (See Comments)    Unknown to patient    Sulfamethoxazole-Trimethoprim Other (See Comments)   Latex Hives and Rash    REVIEW OF SYSTEMS:  General: no fevers/chills/night sweats Eyes: no blurry vision, diplopia, or amaurosis ENT: no sore throat or hearing loss Resp: no cough, wheezing, or hemoptysis CV: no edema or palpitations GI: no abdominal pain, nausea, vomiting, diarrhea, or constipation GU: no dysuria, frequency, or hematuria Skin: no rash Neuro: no headache, numbness, tingling, or weakness of extremities Musculoskeletal: no joint pain or swelling Heme: no bleeding, DVT, or easy bruising Endo: no polydipsia or polyuria  BP 130/62   Pulse 74   Ht 5'  9" (1.753 m)   Wt 154 lb 3.2 oz (69.9 kg)   SpO2 98%   BMI 22.77 kg/m   PHYSICAL EXAM: GEN:  AO x 3 in no acute distress HEENT: normal Dentition:  Normal Neck: JVP normal. +2 carotid upstrokes without bruits. No thyromegaly. Lungs: equal expansion, clear bilaterally CV: Apex is discrete and nondisplaced, RRR with 3/6 SEM Abd: soft, non-tender, non-distended; no bruit; positive bowel sounds Ext: no edema, ecchymoses, or cyanosis Vascular: 2+ femoral pulses, 2+ radial pulses       Skin: warm and dry without rash Neuro: CN II-XII grossly intact; motor and sensory grossly intact    DATA AND STUDIES:  EKG: Atrial fibrillation with a right bundle branch block  2D ECHO: 2024 1. Left ventricular ejection fraction, by estimation, is 55 to 60%. The  left ventricle has normal function. The left ventricle has no regional  wall motion abnormalities. There is mild concentric left ventricular  hypertrophy. Left ventricular diastolic  parameters are indeterminate.   2. Right ventricular systolic function is normal. The right ventricular  size is normal. Tricuspid regurgitation signal is inadequate for assessing  PA pressure.   3. Left atrial size was moderately dilated.   4. Right atrial size was moderately dilated.   5. The mitral valve is normal in structure. Mild mitral valve  regurgitation. No evidence of mitral stenosis.   6. The aortic valve is tricuspid. There is severe calcifcation of the  aortic valve. Aortic valve regurgitation is trivial. Severe aortic valve  stenosis. Aortic valve area, by VTI measures 0.68 cm. Aortic valve mean  gradient measures 42.5 mmHg.   7. Aortic dilatation noted. There is mild dilatation of the ascending  aorta, measuring 39 mm.   8. The inferior vena cava is normal in size with greater than 50%  respiratory variability, suggesting right atrial pressure of 3 mmHg.   9. The patient was in atrial fibrillation.   CARDIAC CATH: n/a  STS RISK CALCULATOR: pending  NHYA CLASS: 1    ASSESSMENT AND PLAN:   Nonrheumatic aortic valve stenosis - Plan: ECHOCARDIOGRAM COMPLETE  Permanent atrial  fibrillation ([place]) - Plan: ECHOCARDIOGRAM COMPLETE  RBBB - Plan: ECHOCARDIOGRAM COMPLETE  Primary hypertension - Plan: ECHOCARDIOGRAM COMPLETE  Limited granulomatosis with polyangiitis ([place]) - Plan: ECHOCARDIOGRAM COMPLETE  The patient is recovering from pneumonia and this feels somewhat short of breath.  He is about 2 weeks out from contracting pneumonia.  He is still fairly active but does have some shortness of breath which I would expect from someone being so close from contracting pneumonia.  He fortunately has not had any presyncope, syncope, or chest pain.  For now we will have the patient follow-up in 3 months with another echocardiogram.  Certainly if he were to feel worse in any way have asked him to contact my office for an expedited evaluation.   Early Osmond, MD  04/25/2022 10:45 AM    [facility] [address], [place], [place]  [address] Phone: [phone]; Fax: [phone]

## 2022-04-25 ENCOUNTER — Ambulatory Visit: Payer: Medicare Other | Attending: Internal Medicine | Admitting: Internal Medicine

## 2022-04-25 ENCOUNTER — Encounter: Payer: Self-pay | Admitting: Internal Medicine

## 2022-04-25 VITALS — BP 130/62 | HR 74 | Ht 69.0 in | Wt 154.2 lb

## 2022-04-25 DIAGNOSIS — I4510 Unspecified right bundle-branch block: Secondary | ICD-10-CM | POA: Insufficient documentation

## 2022-04-25 DIAGNOSIS — I350 Nonrheumatic aortic (valve) stenosis: Secondary | ICD-10-CM | POA: Insufficient documentation

## 2022-04-25 DIAGNOSIS — I4821 Permanent atrial fibrillation: Secondary | ICD-10-CM | POA: Insufficient documentation

## 2022-04-25 DIAGNOSIS — I10 Essential (primary) hypertension: Secondary | ICD-10-CM | POA: Diagnosis not present

## 2022-04-25 DIAGNOSIS — M3130 Wegener's granulomatosis without renal involvement: Secondary | ICD-10-CM | POA: Diagnosis not present

## 2022-04-25 NOTE — Patient Instructions (Signed)
Medication Instructions:  Your physician recommends that you continue on your current medications as directed. Please refer to the Current Medication list given to you today.  *If you need a refill on your cardiac medications before your next appointment, please call your pharmacy*  Lab Work: If you have labs (blood work) drawn today and your tests are completely normal, you will receive your results only by: [place] (if you have MyChart) OR A paper copy in the mail If you have any lab test that is abnormal or we need to change your treatment, we will call you to review the results.  Testing/Procedures: Your physician has requested that you have an echocardiogram. Echocardiography is a painless test that uses sound waves to create images of your heart. It provides your doctor with information about the size and shape of your heart and how well your heart's chambers and valves are working. This procedure takes approximately one hour. There are no restrictions for this procedure. Please do NOT wear cologne, perfume, aftershave, or lotions (deodorant is allowed). Please arrive 15 minutes prior to your appointment time.  Follow-Up: At [facility], you and your health needs are our priority.  As part of our continuing mission to provide you with exceptional heart care, we have created designated Provider Care Teams.  These Care Teams include your primary Cardiologist (physician) and Advanced Practice Providers (APPs -  Physician Assistants and Nurse Practitioners) who all work together to provide you with the care you need, when you need it.  We recommend signing up for the patient portal called "MyChart".  Sign up information is provided on this After Visit Summary.  MyChart is used to connect with patients for Virtual Visits (Telemedicine).  Patients are able to view lab/test results, encounter notes, upcoming appointments, etc.  Non-urgent messages can be sent to your provider as  well.   To learn more about what you can do with MyChart, go to [email or web address].    Your next appointment:   3 month(s)  Provider:   Early Osmond, MD

## 2022-04-27 ENCOUNTER — Other Ambulatory Visit (HOSPITAL_COMMUNITY): Payer: Self-pay | Admitting: *Deleted

## 2022-04-27 MED ORDER — APIXABAN 5 MG PO TABS: 5.0000 mg | ORAL_TABLET | Freq: Two times a day (BID) | ORAL | 2 refills | Status: DC

## 2022-05-04 ENCOUNTER — Ambulatory Visit: Payer: Medicare Other

## 2022-05-06 ENCOUNTER — Encounter: Payer: Self-pay | Admitting: Pulmonary Disease

## 2022-05-06 ENCOUNTER — Ambulatory Visit (INDEPENDENT_AMBULATORY_CARE_PROVIDER_SITE_OTHER): Payer: Medicare Other | Admitting: Pulmonary Disease

## 2022-05-06 VITALS — BP 118/62 | HR 78 | Temp 97.5°F | Ht 69.0 in | Wt 152.2 lb

## 2022-05-06 DIAGNOSIS — J4550 Severe persistent asthma, uncomplicated: Secondary | ICD-10-CM | POA: Diagnosis not present

## 2022-05-06 DIAGNOSIS — R0602 Shortness of breath: Secondary | ICD-10-CM

## 2022-05-06 DIAGNOSIS — M3130 Wegener's granulomatosis without renal involvement: Secondary | ICD-10-CM

## 2022-05-06 NOTE — Patient Instructions (Addendum)
Will get high-resolution CT in a month to reevaluate your lungs Continue the Berna Bue and Symbicort Return to clinic in 1 to 2 months for review.

## 2022-05-06 NOTE — Progress Notes (Signed)
Chad Morrison    EX:[id number]    1932-07-24  Primary Care Physician:Tisovec, Fransico Him, MD  Referring Physician: Haywood Pao, MD [address] [place],  [place] [address]  Chief complaint: Follow-up for asthma  HPI: 89 y.o.  with severe asthma, acid reflux, LPR, hypogammaglobilinemia, granulomatosis with polyangiitis, atrial fibrillation, aortic stenosis is doing okay  Maintained on Symbicort inhaler. Evaluated by Dr. Neldon Mc from allergy and started on Fasenra in Oct 2021.   History notable for granulomatosis with polyangiitis.  He follows at [facility] for this.  Previously on methotrexate and prednisone.  His prednisone has been weaned off and methotrexate is coming down as well.  He feels that his asthma symptoms were under good control while he was on the prednisone but has flared up once he was offered.  Prednisone tapers usually helps him with his symptoms. Off methotrexate in March 2022 per his [employer] on Nexium for GERD.  He was started on budesonide until nasal sprays and Nexium increased to twice a day by Dr. Neldon Mc but has been noncompliant with this change  Pets: No pets Occupation: Retired Financial planner Exposures: No known exposures.  No mold, hot tub, Jacuzzi.  No feather pillows or comforter Smoking history: 4-pack-year smoker.  Quit in 1963 Travel history: Originally from [place].  No significant recent travel Relevant family history: No significant family history of lung disease  Interim history: Hospitalized in February with weakness, fatigue and bilateral lower lobe inflammatory changes.  He was treated for community-acquired pneumonia and discharged on room air.  Post discharge he continues to feel some fatigue  Continues on Fasenra, inhalers.   Outpatient Encounter Medications as of 05/06/2022  Medication Sig   albuterol (VENTOLIN HFA) 108 (90 Base) MCG/ACT inhaler Inhale 2 puffs into the lungs every 4 (four) hours as needed  for wheezing or shortness of breath.   ALPRAZolam (XANAX) 0.25 MG tablet Take 0.25 mg by mouth at bedtime as needed for sleep.    apixaban (ELIQUIS) 5 MG TABS tablet Take 1 tablet (5 mg total) by mouth 2 (two) times daily.   Ascorbic Acid (VITAMIN C PO) Take 1 tablet by mouth every morning. Not sure the dosage   azelastine (ASTELIN) 0.1 % nasal spray Place 1 spray into both nostrils 2 (two) times daily.   Benralizumab (FASENRA [place]) Inject into the skin every 8 (eight) weeks.   budesonide-formoterol (SYMBICORT) 160-4.5 MCG/ACT inhaler Inhale 2 puffs into the lungs 2 (two) times daily.   Calcium Carbonate-Vitamin D (CALCIUM-VITAMIN D) 600-125 MG-UNIT TABS Take 1 tablet by mouth 2 (two) times daily.    diltiazem (CARDIZEM CD) 180 MG 24 hr capsule TAKE 1 CAPSULE EVERY       MORNING AND AT BEDTIME (Patient taking differently: Take 180 mg by mouth 2 (two) times daily.)   EPINEPHrine 0.3 mg/0.3 mL IJ SOAJ injection SMARTSIG:0.3 Milligram(s) IM Once (Patient taking differently: Inject 0.3 mg into the muscle as needed for anaphylaxis. SMARTSIG:0.3 Milligram(s) IM Once)   esomeprazole (NEXIUM) 40 MG capsule Take 1 capsule (40 mg total) by mouth 2 (two) times daily.   Ferrous Sulfate (SLOW FE PO) Take 1 tablet by mouth every morning.   levocetirizine (XYZAL) 5 MG tablet TAKE 1 TABLET EVERY EVENING   levothyroxine (SYNTHROID, LEVOTHROID) 75 MCG tablet Take 75 mcg by mouth daily before breakfast.   montelukast (SINGULAIR) 10 MG tablet Take 1 tablet (10 mg total) by mouth at bedtime.   Multiple Vitamin (  MULTIVITAMIN WITH MINERALS) TABS tablet Take 1 tablet daily by mouth.   Multiple Vitamins-Minerals (OCUVITE PRESERVISION PO) Take 1 tablet 2 (two) times daily by mouth.   Polyethyl Glycol-Propyl Glycol (SYSTANE OP) Place 1 drop into both eyes every morning.   rosuvastatin (CRESTOR) 5 MG tablet TAKE 1 TABLET DAILY   ZITHROMAX Z-PAK 250 MG tablet Take 250 mg by mouth daily.   Facility-Administered Encounter  Medications as of 05/06/2022  Medication   Benralizumab SOSY 30 mg   Physical Exam: Blood pressure 126/68, pulse 79, temperature (!) 97.5 F (36.4 C), temperature source Oral, height '5\' 9"'$  (1.753 m), weight 152 lb 9.6 oz (69.2 kg), SpO2 97 %. Gen:      No acute distress HEENT:  EOMI, sclera anicteric Neck:     No masses; no thyromegaly Lungs:    Clear to auscultation bilaterally; normal respiratory effort CV:         Irregular, systolic murmur Abd:      + bowel sounds; soft, non-tender; no palpable masses, no distension Ext:    No edema; adequate peripheral perfusion Skin:      Warm and dry; no rash Neuro: alert and oriented x 3 Psych: normal mood and affect   Data Reviewed: Imaging: High-resolution CT 12/22/2017-mild bibasal groundglass with mild reticulation.  Pulmonary nodules. CT chest 05/06/2019-stable pulmonary nodules since 2019.  Scattered linear atelectasis and mild bronchiectasis High-resolution CT 05/10/2021-no evidence of interstitial lung disease CT chest 04/12/2022-bronchiectasis, consolidative changes, scattered areas of bandlike opacities. Possible underlying fibrotic changes. I reviewed the images personally.   PFTs: 05/24/2017 FVC 3.78 [96%], FEV1 2.44 [88%], F/F 65, TLC 7.41 [104%], DLCO 22.81 [90%] Moderate obstruction with air trapping and mild diffusion impairment  ACT score 12/07/2019- 19 ACT score 05/05/2020- 23 ACT score 11/24/2020-25  Labs: CBC 10/22/2019-WBC 5.7, eos 14%, absolute eosinophil count 798  Assessment:  Recent admission for community acquired pneumonia Making a slow recovery but still has some fatigue CT scan shows some changes at the base. It is unclear if he has developed an interstitial lung disease. His prior CT scan 1 year ago in 2023 did not show any concerning changes   Repeat CT in 4 weeks to reassess  Severe persistent asthma with eosinophilia Sinusitis Continue Symbicort inhaler and Fasenra with good response Follows with Dr.  Tamsen Roers with polyangiitis Of methotrexate since 2022.  His disease appears to be in remission   Plan/Recommendations: Continue Symbicort, Fasenra High-res CT in 4 weeks  Marshell Garfinkel MD [facility] 05/06/2022, 11:33 AM  CC: Tisovec, Fransico Him, MD

## 2022-05-09 ENCOUNTER — Other Ambulatory Visit: Payer: Self-pay | Admitting: Allergy and Immunology

## 2022-05-16 ENCOUNTER — Encounter: Payer: Self-pay | Admitting: Podiatry

## 2022-05-16 ENCOUNTER — Other Ambulatory Visit: Payer: Self-pay | Admitting: Allergy and Immunology

## 2022-05-16 ENCOUNTER — Ambulatory Visit (INDEPENDENT_AMBULATORY_CARE_PROVIDER_SITE_OTHER): Payer: Medicare Other | Admitting: Podiatry

## 2022-05-16 DIAGNOSIS — D689 Coagulation defect, unspecified: Secondary | ICD-10-CM

## 2022-05-16 DIAGNOSIS — L84 Corns and callosities: Secondary | ICD-10-CM

## 2022-05-17 ENCOUNTER — Inpatient Hospital Stay: Payer: Medicare Other | Admitting: Pulmonary Disease

## 2022-05-18 NOTE — Progress Notes (Signed)
Subjective:   Patient ID: Chad Morrison, male   DOB: 89 y.o.   MRN: [id number]   HPI Patient presents with chronic lesion second digit right plantar aspect right heel that are painful and he cannot take care of himself with patient on blood thinner   ROS      Objective:  Physical Exam  Neurovascular unchanged with thick keratotic lesion distal second digit right plantar heel right with lucent cores painful when pressed with patient at high risk     Assessment:  Chronic lesion x 2 right painful     Plan:  Debridement lesions no angiogenic bleeding reappoint routine care

## 2022-05-19 DIAGNOSIS — H30041 Focal chorioretinal inflammation, macular or paramacular, right eye: Secondary | ICD-10-CM | POA: Diagnosis not present

## 2022-05-19 DIAGNOSIS — H04122 Dry eye syndrome of left lacrimal gland: Secondary | ICD-10-CM | POA: Diagnosis not present

## 2022-05-19 DIAGNOSIS — H26492 Other secondary cataract, left eye: Secondary | ICD-10-CM | POA: Diagnosis not present

## 2022-05-19 DIAGNOSIS — H353212 Exudative age-related macular degeneration, right eye, with inactive choroidal neovascularization: Secondary | ICD-10-CM | POA: Diagnosis not present

## 2022-05-19 DIAGNOSIS — H30001 Unspecified focal chorioretinal inflammation, right eye: Secondary | ICD-10-CM | POA: Diagnosis not present

## 2022-05-19 DIAGNOSIS — H35351 Cystoid macular degeneration, right eye: Secondary | ICD-10-CM | POA: Diagnosis not present

## 2022-05-19 DIAGNOSIS — H4040X0 Glaucoma secondary to eye inflammation, unspecified eye, stage unspecified: Secondary | ICD-10-CM | POA: Diagnosis not present

## 2022-05-19 DIAGNOSIS — H353122 Nonexudative age-related macular degeneration, left eye, intermediate dry stage: Secondary | ICD-10-CM | POA: Diagnosis not present

## 2022-05-19 DIAGNOSIS — H35371 Puckering of macula, right eye: Secondary | ICD-10-CM | POA: Diagnosis not present

## 2022-05-19 DIAGNOSIS — H353114 Nonexudative age-related macular degeneration, right eye, advanced atrophic with subfoveal involvement: Secondary | ICD-10-CM | POA: Diagnosis not present

## 2022-05-19 DIAGNOSIS — H18221 Idiopathic corneal edema, right eye: Secondary | ICD-10-CM | POA: Diagnosis not present

## 2022-05-20 DIAGNOSIS — R6889 Other general symptoms and signs: Secondary | ICD-10-CM | POA: Diagnosis not present

## 2022-05-20 DIAGNOSIS — R600 Localized edema: Secondary | ICD-10-CM | POA: Diagnosis not present

## 2022-05-28 ENCOUNTER — Other Ambulatory Visit: Payer: Self-pay | Admitting: Allergy and Immunology

## 2022-05-30 ENCOUNTER — Inpatient Hospital Stay (HOSPITAL_BASED_OUTPATIENT_CLINIC_OR_DEPARTMENT_OTHER)
Admission: EM | Admit: 2022-05-30 | Discharge: 2022-06-01 | DRG: 641 | Disposition: A | Discharge status: Home / Self Care | Payer: Medicare Other | Attending: Internal Medicine | Admitting: Internal Medicine

## 2022-05-30 ENCOUNTER — Encounter (HOSPITAL_BASED_OUTPATIENT_CLINIC_OR_DEPARTMENT_OTHER): Payer: Self-pay | Admitting: Emergency Medicine

## 2022-05-30 ENCOUNTER — Telehealth: Payer: Self-pay | Admitting: Internal Medicine

## 2022-05-30 ENCOUNTER — Emergency Department (HOSPITAL_BASED_OUTPATIENT_CLINIC_OR_DEPARTMENT_OTHER): Payer: Medicare Other

## 2022-05-30 ENCOUNTER — Other Ambulatory Visit: Payer: Self-pay

## 2022-05-30 DIAGNOSIS — E871 Hypo-osmolality and hyponatremia: Principal | ICD-10-CM | POA: Diagnosis present

## 2022-05-30 DIAGNOSIS — M7989 Other specified soft tissue disorders: Secondary | ICD-10-CM | POA: Diagnosis not present

## 2022-05-30 DIAGNOSIS — Z825 Family history of asthma and other chronic lower respiratory diseases: Secondary | ICD-10-CM | POA: Diagnosis not present

## 2022-05-30 DIAGNOSIS — R0602 Shortness of breath: Secondary | ICD-10-CM | POA: Diagnosis not present

## 2022-05-30 DIAGNOSIS — Z882 Allergy status to sulfonamides status: Secondary | ICD-10-CM

## 2022-05-30 DIAGNOSIS — E785 Hyperlipidemia, unspecified: Secondary | ICD-10-CM | POA: Diagnosis present

## 2022-05-30 DIAGNOSIS — Z7951 Long term (current) use of inhaled steroids: Secondary | ICD-10-CM | POA: Diagnosis not present

## 2022-05-30 DIAGNOSIS — Z85828 Personal history of other malignant neoplasm of skin: Secondary | ICD-10-CM

## 2022-05-30 DIAGNOSIS — I10 Essential (primary) hypertension: Secondary | ICD-10-CM

## 2022-05-30 DIAGNOSIS — Z8546 Personal history of malignant neoplasm of prostate: Secondary | ICD-10-CM

## 2022-05-30 DIAGNOSIS — K219 Gastro-esophageal reflux disease without esophagitis: Secondary | ICD-10-CM | POA: Diagnosis present

## 2022-05-30 DIAGNOSIS — I350 Nonrheumatic aortic (valve) stenosis: Secondary | ICD-10-CM

## 2022-05-30 DIAGNOSIS — Z7901 Long term (current) use of anticoagulants: Secondary | ICD-10-CM | POA: Diagnosis not present

## 2022-05-30 DIAGNOSIS — Z823 Family history of stroke: Secondary | ICD-10-CM

## 2022-05-30 DIAGNOSIS — E878 Other disorders of electrolyte and fluid balance, not elsewhere classified: Secondary | ICD-10-CM | POA: Diagnosis not present

## 2022-05-30 DIAGNOSIS — Z79899 Other long term (current) drug therapy: Secondary | ICD-10-CM

## 2022-05-30 DIAGNOSIS — I4821 Permanent atrial fibrillation: Secondary | ICD-10-CM

## 2022-05-30 DIAGNOSIS — M3130 Wegener's granulomatosis without renal involvement: Secondary | ICD-10-CM | POA: Diagnosis present

## 2022-05-30 DIAGNOSIS — Z833 Family history of diabetes mellitus: Secondary | ICD-10-CM

## 2022-05-30 DIAGNOSIS — Z87891 Personal history of nicotine dependence: Secondary | ICD-10-CM

## 2022-05-30 DIAGNOSIS — Z91040 Latex allergy status: Secondary | ICD-10-CM | POA: Diagnosis not present

## 2022-05-30 DIAGNOSIS — D649 Anemia, unspecified: Secondary | ICD-10-CM | POA: Diagnosis present

## 2022-05-30 DIAGNOSIS — Z880 Allergy status to penicillin: Secondary | ICD-10-CM | POA: Diagnosis not present

## 2022-05-30 DIAGNOSIS — J4550 Severe persistent asthma, uncomplicated: Secondary | ICD-10-CM

## 2022-05-30 DIAGNOSIS — Z79890 Hormone replacement therapy (postmenopausal): Secondary | ICD-10-CM | POA: Diagnosis not present

## 2022-05-30 DIAGNOSIS — E039 Hypothyroidism, unspecified: Secondary | ICD-10-CM | POA: Diagnosis present

## 2022-05-30 LAB — CBC
HCT: 28.2 % — ABNORMAL LOW (ref 39.0–52.0)
Hemoglobin: 9.7 g/dL — ABNORMAL LOW (ref 13.0–17.0)
MCH: 27.5 pg (ref 26.0–34.0)
MCHC: 34.4 g/dL (ref 30.0–36.0)
MCV: 79.9 fL — ABNORMAL LOW (ref 80.0–100.0)
Platelets: 216 10*3/uL (ref 150–400)
RBC: 3.53 MIL/uL — ABNORMAL LOW (ref 4.22–5.81)
RDW: 12.7 % (ref 11.5–15.5)
WBC: 5.9 10*3/uL (ref 4.0–10.5)
nRBC: 0 % (ref 0.0–0.2)

## 2022-05-30 LAB — COMPREHENSIVE METABOLIC PANEL
ALT: 16 U/L (ref 0–44)
AST: 22 U/L (ref 15–41)
Albumin: 3.7 g/dL (ref 3.5–5.0)
Alkaline Phosphatase: 70 U/L (ref 38–126)
Anion gap: 11 (ref 5–15)
BUN: 11 mg/dL (ref 8–23)
CO2: 21 mmol/L — ABNORMAL LOW (ref 22–32)
Calcium: 8.9 mg/dL (ref 8.9–10.3)
Chloride: 88 mmol/L — ABNORMAL LOW (ref 98–111)
Creatinine, Ser: 0.94 mg/dL (ref 0.61–1.24)
GFR, Estimated: >60 mL/min (ref >60)
Glucose, Bld: 125 mg/dL — ABNORMAL HIGH (ref 70–99)
Potassium: 3.5 mmol/L (ref 3.5–5.1)
Sodium: 120 mmol/L — ABNORMAL LOW (ref 135–145)
Total Bilirubin: 0.5 mg/dL (ref 0.3–1.2)
Total Protein: 6.3 g/dL — ABNORMAL LOW (ref 6.5–8.1)

## 2022-05-30 LAB — OSMOLALITY: Osmolality: 254 mOsm/kg — ABNORMAL LOW (ref 275–295)

## 2022-05-30 LAB — URINALYSIS, ROUTINE W REFLEX MICROSCOPIC
Bilirubin Urine: NEGATIVE
Glucose, UA: NEGATIVE mg/dL
Hgb urine dipstick: NEGATIVE
Ketones, ur: NEGATIVE mg/dL
Leukocytes,Ua: NEGATIVE
Nitrite: NEGATIVE
Protein, ur: NEGATIVE mg/dL
Specific Gravity, Urine: 1.009 (ref 1.005–1.030)
pH: 5 (ref 5.0–8.0)

## 2022-05-30 LAB — SODIUM: Sodium: 125 mmol/L — ABNORMAL LOW (ref 135–145)

## 2022-05-30 LAB — PHOSPHORUS: Phosphorus: 3.2 mg/dL (ref 2.5–4.6)

## 2022-05-30 LAB — MAGNESIUM: Magnesium: 1.5 mg/dL — ABNORMAL LOW (ref 1.7–2.4)

## 2022-05-30 LAB — OSMOLALITY, URINE: Osmolality, Ur: 261 mOsm/kg — ABNORMAL LOW (ref 300–900)

## 2022-05-30 LAB — OCCULT BLOOD X 1 CARD TO LAB, STOOL: Fecal Occult Bld: NEGATIVE

## 2022-05-30 MED ORDER — MAGNESIUM SULFATE IN D5W 1-5 GM/100ML-% IV SOLN
1.0000 g | Freq: Once | INTRAVENOUS | Status: AC
  Administered 2022-05-30: 1 g via INTRAVENOUS
  Filled 2022-05-30: qty 100

## 2022-05-30 MED ORDER — SENNOSIDES-DOCUSATE SODIUM 8.6-50 MG PO TABS: 1.0000 | ORAL_TABLET | Freq: Every evening | ORAL | Status: DC | PRN

## 2022-05-30 MED ORDER — ONDANSETRON HCL 4 MG PO TABS: 4.0000 mg | ORAL_TABLET | Freq: Four times a day (QID) | ORAL | Status: DC | PRN

## 2022-05-30 MED ORDER — ROSUVASTATIN CALCIUM 5 MG PO TABS
5.0000 mg | ORAL_TABLET | Freq: Every day | ORAL | Status: DC
  Administered 2022-05-31 – 2022-06-01 (×2): 5 mg via ORAL
  Filled 2022-05-30 (×2): qty 1

## 2022-05-30 MED ORDER — DILTIAZEM HCL ER COATED BEADS 180 MG PO CP24
180.0000 mg | ORAL_CAPSULE | Freq: Two times a day (BID) | ORAL | Status: DC
  Administered 2022-05-31 – 2022-06-01 (×3): 180 mg via ORAL
  Filled 2022-05-30 (×3): qty 1

## 2022-05-30 MED ORDER — LEVOTHYROXINE SODIUM 75 MCG PO TABS
75.0000 ug | ORAL_TABLET | Freq: Every day | ORAL | Status: DC
  Administered 2022-05-31: 75 ug via ORAL
  Filled 2022-05-30: qty 1

## 2022-05-30 MED ORDER — SODIUM CHLORIDE 0.9 % IV BOLUS
1000.0000 mL | Freq: Once | INTRAVENOUS | Status: AC
  Administered 2022-05-30: 1000 mL via INTRAVENOUS

## 2022-05-30 MED ORDER — SODIUM CHLORIDE 0.9% FLUSH
3.0000 mL | Freq: Two times a day (BID) | INTRAVENOUS | Status: DC
  Administered 2022-05-31 – 2022-06-01 (×4): 3 mL via INTRAVENOUS

## 2022-05-30 MED ORDER — APIXABAN 5 MG PO TABS
5.0000 mg | ORAL_TABLET | Freq: Two times a day (BID) | ORAL | Status: DC
  Administered 2022-05-31 – 2022-06-01 (×4): 5 mg via ORAL
  Filled 2022-05-30 (×4): qty 1

## 2022-05-30 MED ORDER — ACETAMINOPHEN 650 MG RE SUPP: 650.0000 mg | Freq: Four times a day (QID) | RECTAL | Status: DC | PRN

## 2022-05-30 MED ORDER — ONDANSETRON HCL 4 MG/2ML IJ SOLN: 4.0000 mg | Freq: Four times a day (QID) | INTRAMUSCULAR | Status: DC | PRN

## 2022-05-30 MED ORDER — ALPRAZOLAM 0.25 MG PO TABS
0.2500 mg | ORAL_TABLET | Freq: Every evening | ORAL | Status: DC | PRN
  Administered 2022-05-31 (×2): 0.25 mg via ORAL
  Filled 2022-05-30 (×2): qty 1

## 2022-05-30 MED ORDER — ALBUTEROL SULFATE (2.5 MG/3ML) 0.083% IN NEBU: 3.0000 mL | INHALATION_SOLUTION | RESPIRATORY_TRACT | Status: DC | PRN

## 2022-05-30 MED ORDER — MONTELUKAST SODIUM 10 MG PO TABS
10.0000 mg | ORAL_TABLET | Freq: Every day | ORAL | Status: DC
  Administered 2022-05-31 (×2): 10 mg via ORAL
  Filled 2022-05-30 (×2): qty 1

## 2022-05-30 MED ORDER — MOMETASONE FURO-FORMOTEROL FUM 200-5 MCG/ACT IN AERO
2.0000 | INHALATION_SPRAY | Freq: Two times a day (BID) | RESPIRATORY_TRACT | Status: DC
  Administered 2022-05-31 – 2022-06-01 (×4): 2 via RESPIRATORY_TRACT
  Filled 2022-05-30: qty 8.8

## 2022-05-30 MED ORDER — SODIUM CHLORIDE 0.9 % IV SOLN: INTRAVENOUS | Status: AC

## 2022-05-30 MED ORDER — ACETAMINOPHEN 325 MG PO TABS
650.0000 mg | ORAL_TABLET | Freq: Four times a day (QID) | ORAL | Status: DC | PRN
  Administered 2022-05-31: 650 mg via ORAL
  Filled 2022-05-30: qty 2

## 2022-05-30 NOTE — ED Notes (Signed)
Pt aware of the need for a urine sample... Unable to provide at this time.Chad KitchenMarland Morrison

## 2022-05-30 NOTE — ED Notes (Signed)
Chad Morrison with cl called for transport

## 2022-05-30 NOTE — Progress Notes (Signed)
Patient asked to speak with respiratory regarding his inhaler use.  Patient made note that he no longer takes fluticasone-salmeterol (WIXELA INHUB) 250-50 MCG/ACT AEPB and instead takes Advair HFA.  I informed the patient that his evening medication orders would be written by the admitting MD upon arrival to [facility], but I would also put a note in his chart for him.

## 2022-05-30 NOTE — Telephone Encounter (Signed)
Pt c/o swelling: STAT is pt has developed SOB within 24 hours  If swelling, where is the swelling located? LE  How much weight have you gained and in what time span? N/A  Have you gained 3 pounds in a day or 5 pounds in a week? No   Do you have a log of your daily weights (if so, list)? No   Are you currently taking a fluid pill? No   Are you currently SOB? Yes   Have you traveled recently? No

## 2022-05-30 NOTE — ED Notes (Signed)
Report given to the floor and carelink... 

## 2022-05-30 NOTE — ED Triage Notes (Signed)
Pt arrives to ED with c/o SOB that started x3-4 days and leg swelling x3 weeks.

## 2022-05-30 NOTE — Telephone Encounter (Signed)
Urgent / STAT call received in [facility] Triage regarding Pt change in condition.    Pt spouse stated that Pt condition has been worsening over the weekend.  Pt and spouse see Dr. Ali Lowe at [facility], but he is not available today. They were told to reach out if Pt condition worsens.  Per spouse, there was some concern about pneumonia?   Pt spouse states Pt has been battling fatigue, weakness, shortness of breath, and edematous legs with pitting edema.  Pt is having difficulty ambulating.  I had the spouse obtain a BP and HR for me, and it was 148/74 with HR of 71. Per the symptoms assessed above, I recommended Pt see a provider at the nearest ER. With multiple symptoms, edema, and shortness of breath, Pt spouse educated on the need for care.  They were advised to share these symptoms with Triage RN, and let Triage RN know there would be a note in the CPU outlining the need for urgent / emergent care.  Pt spouse understood education, and stated she would take her husband to the ER. Lastly, Pt spouse advised, if his breathing worsens, or symptoms worsen at any time, to call 911 to assist in transporting her husband to the ER.  Pt spouse understood.

## 2022-05-30 NOTE — ED Provider Notes (Signed)
[facility] Provider Note   CSN: [id number] Arrival date & time: 05/30/22  1044     History  Chief Complaint  Patient presents with   Shortness of Breath   Leg Swelling    Chad Morrison is a 89 y.o. male with past medical history as outlined below who presents to the ED complaining of swelling to his legs and pain with walking.  Patient reports the swelling in his legs has become so significant that it makes it very difficult for him to walk.  Patient did have some shortness of breath but resolved and he believes it was related to his asthma.  He also feels that he has had some confusion or fogginess of his brain lately.  Patient does have a history of A-fib is on anticoagulation chronically.  Patient reports no missed doses.  Denies fever, chills, numbness or tingling, weakness, chest pain, chest tightness, dizziness, lightheadedness, syncope, nausea, vomiting, diarrhea, abdominal pain.  Past Medical History:  Diagnosis Date   Anxiety    Asthma    Asthmatic bronchitis    Atrial flutter    chads2vasc score is 3   Ehrlichiosis [id number]   GERD (gastroesophageal reflux disease)    Granulomatosis with polyangiitis    HTN (hypertension)    Hyperlipidemia    Nuclear sclerotic cataract of left eye 06/04/2019   Cataract surgery completed March 2023, Dr. [name]   Paroxysmal atrial fibrillation    Prostate cancer    Seasonal allergies           Home Medications Prior to Admission medications   Medication Sig Start Date End Date Taking? Authorizing Provider  albuterol (VENTOLIN HFA) 108 (90 Base) MCG/ACT inhaler USE 2 INHALATIONS ORALLY   EVERY 4 HOURS AS NEEDED FORWHEEZING OR SHORTNESS OF   BREATH 05/09/22   Kozlow, Donnamarie Poag, MD  ALPRAZolam Duanne Moron) 0.25 MG tablet Take 0.25 mg by mouth at bedtime as needed for sleep.     [provider]  apixaban (ELIQUIS) 5 MG TABS tablet Take 1 tablet (5 mg total) by mouth 2 (two) times daily. 04/27/22    Fenton, Clint R, PA  Ascorbic Acid (VITAMIN C PO) Take 1 tablet by mouth every morning. Not sure the dosage    [provider]  azelastine (ASTELIN) 0.1 % nasal spray Place 1 spray into both nostrils 2 (two) times daily. 03/31/21   [provider]  Benralizumab [facility]) Inject into the skin every 8 (eight) weeks.    [provider]  Calcium Carbonate-Vitamin D (CALCIUM-VITAMIN D) 600-125 MG-UNIT TABS Take 1 tablet by mouth 2 (two) times daily.     [provider]  diltiazem (CARDIZEM CD) 180 MG 24 hr capsule TAKE 1 CAPSULE EVERY       MORNING AND AT BEDTIME Patient taking differently: Take 180 mg by mouth 2 (two) times daily. 12/06/21   Fenton, Clint R, PA  EPINEPHrine 0.3 mg/0.3 mL IJ SOAJ injection SMARTSIG:0.3 Milligram(s) IM Once Patient taking differently: Inject 0.3 mg into the muscle as needed for anaphylaxis. SMARTSIG:0.3 Milligram(s) IM Once 06/22/21   Kozlow, Donnamarie Poag, MD  esomeprazole (NEXIUM) 40 MG capsule Take 1 capsule (40 mg total) by mouth 2 (two) times daily. 12/21/21   Kozlow, Donnamarie Poag, MD  Ferrous Sulfate (SLOW FE PO) Take 1 tablet by mouth every morning.    [provider]  fluticasone-salmeterol (WIXELA INHUB) 250-50 MCG/ACT AEPB Inhale 1 puff into the lungs in the morning and at bedtime.  05/30/22   Kozlow, Donnamarie Poag, MD  levocetirizine (XYZAL) 5 MG tablet TAKE 1 TABLET EVERY EVENING 05/16/22   Kozlow, Donnamarie Poag, MD  levothyroxine (SYNTHROID, LEVOTHROID) 75 MCG tablet Take 75 mcg by mouth daily before breakfast.    [provider]  montelukast (SINGULAIR) 10 MG tablet Take 1 tablet (10 mg total) by mouth at bedtime. 12/21/21   Kozlow, Donnamarie Poag, MD  Multiple Vitamin (MULTIVITAMIN WITH MINERALS) TABS tablet Take 1 tablet daily by mouth.    [provider]  Multiple Vitamins-Minerals (OCUVITE PRESERVISION PO) Take 1 tablet 2 (two) times daily by mouth.    [provider]  Polyethyl Glycol-Propyl Glycol (SYSTANE OP) Place 1  drop into both eyes every morning.    [provider]  rosuvastatin (CRESTOR) 5 MG tablet TAKE 1 TABLET DAILY 12/06/21   Early Osmond, MD  ZITHROMAX Z-PAK 250 MG tablet Take 250 mg by mouth daily. 05/05/22   [provider]      Allergies    Other, Penicillins, Sulfa antibiotics, Sulfamethoxazole-trimethoprim, and Latex    Review of Systems   Review of Systems  Constitutional:  Negative for chills and fever.  Respiratory:  Positive for shortness of breath. Negative for cough and chest tightness.   Cardiovascular:  Positive for leg swelling. Negative for chest pain and palpitations.  Gastrointestinal:  Negative for abdominal pain, diarrhea, nausea and vomiting.  Musculoskeletal:  Positive for gait problem.  Neurological:  Negative for dizziness, syncope, weakness and light-headedness.    Physical Exam Updated Vital Signs BP 135/70 (BP Location: Right Arm)   Pulse 80   Temp 98 F (36.7 C) (Oral)   Resp (!) 24   Ht 5\' 9"  (1.753 m)   Wt 70.9 kg   SpO2 98%   BMI 23.07 kg/m  Physical Exam Vitals and nursing note reviewed.  Constitutional:      General: He is not in acute distress.    Appearance: He is not ill-appearing.  HENT:     Mouth/Throat:     Mouth: Mucous membranes are moist.     Pharynx: Oropharynx is clear.  Cardiovascular:     Rate and Rhythm: Normal rate. Rhythm irregularly irregular.     Pulses: Normal pulses.          Dorsalis pedis pulses are 2+ on the right side and 2+ on the left side.     Heart sounds: Murmur heard.     Systolic murmur is present.     Comments: Patient has significant edema to bilateral lower extremities with left being greater than right.  Pulmonary:     Effort: Pulmonary effort is normal. No tachypnea or respiratory distress.     Breath sounds: Normal breath sounds and air entry.  Abdominal:     General: Abdomen is flat. Bowel sounds are normal. There is no distension.     Palpations: Abdomen is soft.     Tenderness:  There is no abdominal tenderness.  Musculoskeletal:     Right lower leg: 2+ Pitting Edema present.     Left lower leg: 3+ Pitting Edema present.  Skin:    General: Skin is warm and dry.     Capillary Refill: Capillary refill takes less than 2 seconds.     Comments: Patient with erythema to bilateral lower extremities that does not extend beyond the knee.  Skin is very dry.  Neurological:     Mental Status: He is alert. Mental status is at baseline.  Psychiatric:  Mood and Affect: Mood normal.        Behavior: Behavior normal.     ED Results / Procedures / Treatments   Labs (all labs ordered are listed, but only abnormal results are displayed) Labs Reviewed  CBC - Abnormal; Notable for the following components:      Result Value   RBC 3.53 (*)    Hemoglobin 9.7 (*)    HCT 28.2 (*)    MCV 79.9 (*)    All other components within normal limits  COMPREHENSIVE METABOLIC PANEL - Abnormal; Notable for the following components:   Sodium 120 (*)    Chloride 88 (*)    CO2 21 (*)    Glucose, Bld 125 (*)    Total Protein 6.3 (*)    All other components within normal limits  OSMOLALITY, URINE - Abnormal; Notable for the following components:   Osmolality, Ur 261 (*)    All other components within normal limits  OSMOLALITY - Abnormal; Notable for the following components:   Osmolality 254 (*)    All other components within normal limits  MAGNESIUM - Abnormal; Notable for the following components:   Magnesium 1.5 (*)    All other components within normal limits  URINALYSIS, ROUTINE W REFLEX MICROSCOPIC  PHOSPHORUS  OCCULT BLOOD X 1 CARD TO LAB, STOOL  SODIUM, URINE, RANDOM    EKG EKG Interpretation  Date/Time:  Monday May 30 2022 10:52:25 EDT Ventricular Rate:  64 PR Interval:    QRS Duration: 169 QT Interval:  431 QTC Calculation: 445 R Axis:   41 Text Interpretation: Atrial fibrillation Right bundle branch block No significant change since last tracing Confirmed  by Fredia Sorrow [phone]) on 05/30/2022 11:10:51 AM  Radiology US Venous Img Lower Bilateral (DVT)  Result Date: 05/30/2022 CLINICAL DATA:  Leg swelling bilaterally for 3 weeks, shortness of breath for 3-4 days EXAM: BILATERAL LOWER EXTREMITY VENOUS DOPPLER ULTRASOUND TECHNIQUE: Gray-scale sonography with compression, as well as color and duplex ultrasound, were performed to evaluate the deep venous system(s) from the level of the common femoral vein through the popliteal and proximal calf veins. COMPARISON:  None Available. FINDINGS: VENOUS Normal compressibility of BILATERAL common femoral, superficial femoral, and popliteal veins, as well as the visualized calf veins. Visualized portions of BILATERAL profunda femoral vein and great saphenous vein unremarkable. No filling defects to suggest DVT on grayscale or color Doppler imaging. Doppler waveforms show normal direction of venous flow, normal respiratory plasticity and response to augmentation. OTHER Scattered subcutaneous edema BILATERAL lower extremities. Limitations: none IMPRESSION: No evidence of deep venous thrombosis in either lower extremity. Electronically Signed   By: Lavonia Dana M.D.   On: 05/30/2022 13:27   DG Chest Portable 1 View  Result Date: 05/30/2022 CLINICAL DATA:  Shortness of breath EXAM: PORTABLE CHEST 1 VIEW COMPARISON:  04/12/2022 FINDINGS: Hyperinflation. No pneumothorax, effusion or edema. No consolidation. There is some linear opacity lung bases likely scar or atelectasis. Overlapping cardiac leads. Tortuous and ectatic aorta. IMPRESSION: Hyperinflation.  Basilar atelectasis or scar.  No consolidation. Electronically Signed   By: Jill Side M.D.   On: 05/30/2022 12:06    Procedures Procedures    Medications Ordered in ED Medications  magnesium sulfate IVPB 1 g 100 mL (0 g Intravenous Stopped 05/30/22 1639)  sodium chloride 0.9 % bolus 1,000 mL (0 mLs Intravenous Stopped 05/30/22 1639)    ED Course/ Medical Decision  Making/ A&P  Medical Decision Making Amount and/or Complexity of Data Reviewed Labs: ordered. Radiology: ordered.  Risk Prescription drug management. Decision regarding hospitalization.   This patient presents to the ED with chief complaint(s) of lower extremity swelling with pertinent past medical history of asthma, atrial fibrillation on chronic anticoagulation, hypertension, hyperlipidemia.  The complaint involves an extensive differential diagnosis and also carries with it a high risk of complications and morbidity.    The differential diagnosis includes new onset heart failure, cellulitis, DVT, electrolyte derangement  The initial plan is to obtain baseline labs, chest x-ray, ultrasound of bilateral lower extremities  Additional history obtained: Records reviewed previous admission documents, patient was admitted on 04/12/2022 for multifocal pneumonia, he was mildly hyponatremic at this visit with a sodium of 130.  Patient was sent home on antibiotics and he reports recovering from his illness.   Initial Assessment:   Exam significant for lower extremity edema with the left leg larger than the right.  Skin is very dry and flaky.  There is mild erythema to both legs.  Edema is pitting, 3+ left and 2+ right.  DP pulses are 2+.  Sensation is intact.   Heart rate is normal in the 80s and is irregular.  Appears to be Afib on the monitor.  Patient does have a prominent systolic murmur, history of severe aortic valve stenosis.  Lungs are clear to auscultation.  Patient is alert and oriented.  Independent ECG/labs interpretation:  The following labs were independently interpreted:  CBC significant for anemia with a hemoglobin of 9.7, 1 month prior was 12.7.  No leukocytosis.  Metabolic panel with hyponatremia, hypochloremia.  Sodium 1 month prior was 130 is now down to 120.  Magnesium 1.5.  Due to patient having significant hyponatremia, urine and serum  osmolalities were ordered.  Both are lower than normal.  UA without abnormality.  Patient had a 3 point drop in his hemoglobin over the span of 1 month.  He denies any hematochezia or melena.  Fecal occult was negative.  Independent visualization and interpretation of imaging: I independently visualized the following imaging with scope of interpretation limited to determining acute life threatening conditions related to emergency care: Chest x-ray, which revealed hyperinflation, no pleural effusion or infiltrate.  No cardiomegaly.  I agree with radiologist interpretation.  Bilateral lower extremity DVT study was ordered due to patient having significant leg swelling that is new in the left calf is doubled in size.  DVT study was negative.  Treatment and Reassessment: Patient given 1 L of normal saline and 1 g of magnesium.  He has remained stable while in the ED.  I believe that patient would benefit from hospital admission due to hyponatremia without known cause.  Consultations obtained:   I requested consultation with on-call hospitalist and spoke with with Tinnie Gens who agreed with hospital admission.  Disposition:   Patient to be admitted to [facility] for hyponatremia.          Final Clinical Impression(s) / ED Diagnoses Final diagnoses:  Hyponatremia  Anemia, unspecified type  Leg swelling    Rx / DC Orders ED Discharge Orders     None         Chad Simmer, PA-C 05/30/22 1901    Vanetta Mulders, MD 06/03/22 1622

## 2022-05-30 NOTE — H&P (Signed)
History and Physical    Chad Morrison [id number] DOB: 05/15/1932 DOA: 05/30/2022  PCP: Haywood Pao, MD  Patient coming from: Home  I have personally briefly reviewed patient's old medical records in [place]  Chief Complaint: Leg swelling   HPI: Chad Morrison is a 89 y.o. male with medical history significant for granulomatosis with polyangiitis, severe persistent asthma with eosinophilia, permanent atrial fibrillation on Eliquis, severe aortic stenosis, HTN, HLD, hypothyroidism who presented to the ED for evaluation of swelling in his legs.  Patient reports seeing mild swelling in his lower legs beginning 3 weeks ago.  He has tried compression stockings and leg elevation without significant improvement.  He says he was told to maintain adequate oral hydration and has been drinking a lot of water recently.  He denies any nausea, vomiting, diarrhea, abdominal pain, dysuria.  He denies any chest pain or dyspnea.  [facility] ED Course  Labs/Imaging on admission: I have personally reviewed following labs and imaging studies.  Initial vitals showed BP 114/67, pulse 67, RR 14, temp 98.1 F, SpO2 is 96% on room air.  Labs show WBC 5.9, hemoglobin 9.7, platelets 216,000, sodium 120, potassium 3.5, chloride 88, bicarb 21, BUN 11, creatinine 0.94, serum glucose 125, LFTs within normal limits, magnesium 1.5.  UA negative for UTI.  Serum osmolality 254, urine osmolality 261, urine sodium pending.  FOBT negative.  Portable chest x-ray shows hyperinflated lung fields, basilar atelectasis, no consolidation.  Bilateral lower extremity venous Doppler negative for evidence of DVT.  Patient was given 1 L normal saline and 1 g IV magnesium.  The hospitalist service was consulted to admit for further evaluation and management.  Review of Systems: All systems reviewed and are negative except as documented in history of present illness above.   Past Medical History:  Diagnosis Date    Anxiety    Asthma    Asthmatic bronchitis    Atrial flutter    chads2vasc score is 3   Ehrlichiosis [id number]   GERD (gastroesophageal reflux disease)    Granulomatosis with polyangiitis    HTN (hypertension)    Hyperlipidemia    Nuclear sclerotic cataract of left eye 06/04/2019   Cataract surgery completed March 2023, Dr. [name]   Paroxysmal atrial fibrillation    Prostate cancer    Seasonal allergies     Past Surgical History:  Procedure Laterality Date   BASAL CELL CARCINOMA EXCISION  2013   EYELID   HERNIA REPAIR Bilateral 2000, 2002   KNEE ARTHROSCOPY  2000   prostectomy  [place]    Social History:  reports that he quit smoking about 61 years ago. His smoking use included cigarettes. He has a 2.00 pack-year smoking history. He has never used smokeless tobacco. He reports current alcohol use of about 2.0 - 3.0 standard drinks of alcohol per week. He reports that he does not use drugs.  Allergies  Allergen Reactions   Other Other (See Comments)    Latex Gloves   Penicillins Hives    Has patient had a PCN reaction causing immediate rash, facial/tongue/throat swelling, SOB or lightheadedness with hypotension: No Has patient had a PCN reaction causing severe rash involving mucus membranes or skin necrosis: No Has patient had a PCN reaction that required hospitalization: No Has patient had a PCN reaction occurring within the last 10 years: Yes If all of the above answers are "NO", then may proceed with Cephalosporin use.    Sulfa  Antibiotics Other (See Comments)    Unknown to patient    Sulfamethoxazole-Trimethoprim Other (See Comments)   Latex Hives and Rash    Family History  Problem Relation Age of Onset   Asthma Mother 34   Allergies Mother    Ulcers Mother    CVA Father 54   Heart Problems Father    Other Father        NEUROLOGIC ISSUES    Other Brother 32       BICYCLE ACCIDENT   Migraines Son    Diabetes Mellitus I Son       Prior to Admission medications   Medication Sig Start Date End Date Taking? Authorizing Provider  albuterol (VENTOLIN HFA) 108 (90 Base) MCG/ACT inhaler USE 2 INHALATIONS ORALLY   EVERY 4 HOURS AS NEEDED FORWHEEZING OR SHORTNESS OF   BREATH 05/09/22   Kozlow, Donnamarie Poag, MD  ALPRAZolam Duanne Moron) 0.25 MG tablet Take 0.25 mg by mouth at bedtime as needed for sleep.     [provider]  apixaban (ELIQUIS) 5 MG TABS tablet Take 1 tablet (5 mg total) by mouth 2 (two) times daily. 04/27/22   Fenton, Clint R, PA  Ascorbic Acid (VITAMIN C PO) Take 1 tablet by mouth every morning. Not sure the dosage    [provider]  azelastine (ASTELIN) 0.1 % nasal spray Place 1 spray into both nostrils 2 (two) times daily. 03/31/21   [provider]  Benralizumab [facility] [place]) Inject into the skin every 8 (eight) weeks.    [provider]  Calcium Carbonate-Vitamin D (CALCIUM-VITAMIN D) 600-125 MG-UNIT TABS Take 1 tablet by mouth 2 (two) times daily.     [provider]  diltiazem (CARDIZEM CD) 180 MG 24 hr capsule TAKE 1 CAPSULE EVERY       MORNING AND AT BEDTIME Patient taking differently: Take 180 mg by mouth 2 (two) times daily. 12/06/21   Fenton, Clint R, PA  EPINEPHrine 0.3 mg/0.3 mL IJ SOAJ injection SMARTSIG:0.3 Milligram(s) IM Once Patient taking differently: Inject 0.3 mg into the muscle as needed for anaphylaxis. SMARTSIG:0.3 Milligram(s) IM Once 06/22/21   Kozlow, Donnamarie Poag, MD  esomeprazole (NEXIUM) 40 MG capsule Take 1 capsule (40 mg total) by mouth 2 (two) times daily. 12/21/21   Kozlow, Donnamarie Poag, MD  Ferrous Sulfate (SLOW FE PO) Take 1 tablet by mouth every morning.    [provider]  fluticasone-salmeterol (WIXELA INHUB) 250-50 MCG/ACT AEPB Inhale 1 puff into the lungs in the morning and at bedtime. 05/30/22   Kozlow, Donnamarie Poag, MD  levocetirizine (XYZAL) 5 MG tablet TAKE 1 TABLET EVERY EVENING 05/16/22   Kozlow, Donnamarie Poag, MD  levothyroxine (SYNTHROID, LEVOTHROID) 75  MCG tablet Take 75 mcg by mouth daily before breakfast.    [provider]  montelukast (SINGULAIR) 10 MG tablet Take 1 tablet (10 mg total) by mouth at bedtime. 12/21/21   Kozlow, Donnamarie Poag, MD  Multiple Vitamin (MULTIVITAMIN WITH MINERALS) TABS tablet Take 1 tablet daily by mouth.    [provider]  Multiple Vitamins-Minerals (OCUVITE PRESERVISION PO) Take 1 tablet 2 (two) times daily by mouth.    [provider]  Polyethyl Glycol-Propyl Glycol (SYSTANE OP) Place 1 drop into both eyes every morning.    [provider]  rosuvastatin (CRESTOR) 5 MG tablet TAKE 1 TABLET DAILY 12/06/21   Early Osmond, MD  ZITHROMAX Z-PAK 250 MG tablet Take 250 mg by mouth daily. 05/05/22   [provider]  Physical Exam: Vitals:   05/30/22 2030 05/30/22 2100 05/30/22 2156 05/30/22 2208  BP: (!) 149/81 135/80 123/87   Pulse:   89   Resp: (!) 23 19 19    Temp:   97.6 F (36.4 C)   TempSrc:      SpO2: 98%  97%   Weight:    68.9 kg  Height:    5\' 9"  (1.753 m)   Constitutional: Resting in bed, NAD, calm, comfortable Eyes: EOMI, lids and conjunctivae normal ENMT: Mucous membranes are moist. Posterior pharynx clear of any exudate or lesions.Normal dentition.  Neck: normal, supple, no masses. Respiratory: clear to auscultation bilaterally, no wheezing, no crackles. Normal respiratory effort. No accessory muscle use.  Cardiovascular: Regular rate and rhythm, no murmurs / rubs / gallops.  Trace bilateral lower extremity edema above the ankles. 2+ pedal pulses. Abdomen: no tenderness, no masses palpated.  Musculoskeletal: no clubbing / cyanosis. No joint deformity upper and lower extremities. Good ROM, no contractures. Normal muscle tone.  Skin: no rashes, lesions, ulcers. No induration Neurologic: CN 2-12 grossly intact. Sensation intact. Strength 5/5 in all 4.  Psychiatric: Alert and oriented x 3. Normal mood.   EKG: Personally reviewed. Atrial fibrillation, rate  64, RBBB.  Similar to prior.  Assessment/Plan Principal Problem:   Hyponatremia Active Problems:   Permanent atrial fibrillation   Severe persistent asthma   Severe aortic stenosis   Limited granulomatosis with polyangiitis   Essential hypertension   Hyperlipidemia   Peytin Taboada is a 89 y.o. male with medical history significant for granulomatosis with polyangiitis, severe persistent asthma with eosinophilia, permanent atrial fibrillation on Eliquis, severe aortic stenosis, HTN, HLD, hypothyroidism who is admitted with hyponatremia. *** Assessment and Plan: Hyponatremia: ***  Permanent atrial fibrillation: ***  Severe persistent asthma: ***  Severe aortic stenosis: ***  Granulomatosis with polyangiitis: ***  Hypertension: ***  Hyperlipidemia: ***   DVT prophylaxis:  apixaban (ELIQUIS) tablet 5 mg   Code Status: Full code, confirmed with patient on admission Family Communication: Discussed with patient, he has discussed with family Disposition Plan: From home and likely discharge to home pending clinical progress Consults called: None Severity of Illness: The appropriate patient status for this patient is INPATIENT. Inpatient status is judged to be reasonable and necessary in order to provide the required intensity of service to ensure the patient's safety. The patient's presenting symptoms, physical exam findings, and initial radiographic and laboratory data in the context of their chronic comorbidities is felt to place them at high risk for further clinical deterioration. Furthermore, it is not anticipated that the patient will be medically stable for discharge from the hospital within 2 midnights of admission.   * I certify that at the point of admission it is my clinical judgment that the patient will require inpatient hospital care spanning beyond 2 midnights from the point of admission due to high intensity of service, high risk for further deterioration and high  frequency of surveillance required.Zada Finders MD [facility]  If 7PM-7AM, please contact night-coverage [email or web address]  05/31/2022, 12:02 AM

## 2022-05-30 NOTE — Telephone Encounter (Signed)
Pts insurance does not cover symbicort they prefer breo or wixela ?

## 2022-05-31 ENCOUNTER — Telehealth: Payer: Self-pay | Admitting: Internal Medicine

## 2022-05-31 DIAGNOSIS — E785 Hyperlipidemia, unspecified: Secondary | ICD-10-CM

## 2022-05-31 DIAGNOSIS — E871 Hypo-osmolality and hyponatremia: Secondary | ICD-10-CM | POA: Diagnosis not present

## 2022-05-31 DIAGNOSIS — I10 Essential (primary) hypertension: Secondary | ICD-10-CM

## 2022-05-31 DIAGNOSIS — I350 Nonrheumatic aortic (valve) stenosis: Secondary | ICD-10-CM

## 2022-05-31 DIAGNOSIS — J4550 Severe persistent asthma, uncomplicated: Secondary | ICD-10-CM

## 2022-05-31 LAB — SODIUM, URINE, RANDOM: Sodium, Ur: 97 mmol/L

## 2022-05-31 LAB — CBC
HCT: 29.4 % — ABNORMAL LOW (ref 39.0–52.0)
Hemoglobin: 9.9 g/dL — ABNORMAL LOW (ref 13.0–17.0)
MCH: 27.5 pg (ref 26.0–34.0)
MCHC: 33.7 g/dL (ref 30.0–36.0)
MCV: 81.7 fL (ref 80.0–100.0)
Platelets: 210 10*3/uL (ref 150–400)
RBC: 3.6 MIL/uL — ABNORMAL LOW (ref 4.22–5.81)
RDW: 12.5 % (ref 11.5–15.5)
WBC: 4.8 10*3/uL (ref 4.0–10.5)
nRBC: 0 % (ref 0.0–0.2)

## 2022-05-31 LAB — BASIC METABOLIC PANEL
Anion gap: 6 (ref 5–15)
BUN: 9 mg/dL (ref 8–23)
CO2: 25 mmol/L (ref 22–32)
Calcium: 8.3 mg/dL — ABNORMAL LOW (ref 8.9–10.3)
Chloride: 95 mmol/L — ABNORMAL LOW (ref 98–111)
Creatinine, Ser: 0.85 mg/dL (ref 0.61–1.24)
GFR, Estimated: >60 mL/min (ref >60)
Glucose, Bld: 98 mg/dL (ref 70–99)
Potassium: 3.5 mmol/L (ref 3.5–5.1)
Sodium: 126 mmol/L — ABNORMAL LOW (ref 135–145)

## 2022-05-31 LAB — TSH: TSH: 5.815 u[IU]/mL — ABNORMAL HIGH (ref 0.350–4.500)

## 2022-05-31 LAB — MAGNESIUM: Magnesium: 2 mg/dL (ref 1.7–2.4)

## 2022-05-31 MED ORDER — SALINE SPRAY 0.65 % NA SOLN
1.0000 | NASAL | Status: DC | PRN
  Administered 2022-05-31: 1 via NASAL
  Filled 2022-05-31: qty 44

## 2022-05-31 MED ORDER — LEVOTHYROXINE SODIUM 100 MCG PO TABS
100.0000 ug | ORAL_TABLET | Freq: Every day | ORAL | Status: DC
  Administered 2022-06-01: 100 ug via ORAL
  Filled 2022-05-31: qty 1

## 2022-05-31 MED ORDER — COSYNTROPIN 0.25 MG IJ SOLR
0.2500 mg | Freq: Once | INTRAMUSCULAR | Status: DC
  Filled 2022-05-31: qty 0.25

## 2022-05-31 MED ORDER — LEVOTHYROXINE SODIUM 100 MCG/5ML IV SOLN
100.0000 ug | Freq: Once | INTRAVENOUS | Status: AC
  Administered 2022-05-31: 100 ug via INTRAVENOUS
  Filled 2022-05-31: qty 5

## 2022-05-31 NOTE — Telephone Encounter (Signed)
Dr. Avon Gully calling to speak to a doctor in regards to a patient who is currently admitted. Transferred.

## 2022-05-31 NOTE — Hospital Course (Signed)
Chad Morrison is a 89 y.o. male with medical history significant for granulomatosis with polyangiitis, severe persistent asthma with eosinophilia, permanent atrial fibrillation on Eliquis, severe aortic stenosis, HTN, HLD, hypothyroidism who is admitted with hyponatremia.

## 2022-05-31 NOTE — Progress Notes (Signed)
PROGRESS NOTE    Chad Morrison  [id number] DOB: 03-09-1932 DOA: 05/30/2022 PCP: Haywood Pao, MD   Brief Narrative:  Chad Morrison is a 89 y.o. male with medical history significant for granulomatosis with polyangiitis, severe persistent asthma with eosinophilia, permanent atrial fibrillation on Eliquis, severe aortic stenosis, HTN, HLD, hypothyroidism who presented to the ED for evaluation of swelling in his legs that began approximately 3 weeks prior to admission. No improvement with compression stockings/elevation. Has recently increased PO water intake and cut salt intake per outpatient recommendations.  Assessment & Plan:   Principal Problem:   Hyponatremia Active Problems:   Permanent atrial fibrillation   Severe persistent asthma   Severe aortic stenosis   Limited granulomatosis with polyangiitis   Essential hypertension   Hyperlipidemia   Hypotonic (true) hyponatremia: Sodium 120 on admission (baseline 130s earlier this year) Urine osmolality 261, serum osmolality 254, urine sodium elevated at 90 Sodium trending upwards appropriately with - IV NS@75  mL/h Likely secondary to subtherapeutic thyroid replacement (elevated TSH) Possible other etiology includes adrenal insufficiency, HCTZ, SIADH, cerebral salt wasting which are less likely. Will follow am cortisol testing but patient is not on thiazide and siadh/cerebral salt wasting are less common.    Permanent atrial fibrillation: Remains in atrial fibrillation - controlled rate.   Continue diltiazem and Eliquis.   Severe persistent asthma: Stable without wheezing on admission.   Continue Dulera, Singulair, and albuterol as needed.   Severe aortic stenosis: Follow-up cardiology.   Continues to be asymptomatic; discussed case with patient's vascular office and they don't recommend repeat echo given recent echo and planned repeat in the outpatient system in June.   Granulomatosis with polyangiitis: Off methotrexate  since 2022.  Appears to be in remission.   Anemia: Hemoglobin slightly lower than baseline.  He denies any obvious bleeding.  FOBT is negative.  Continue to monitor.   Hypertension: Continue diltiazem.   Hyperlipidemia: Continue rosuvastatin.  DVT prophylaxis: eliquis Code Status: Full Family Communication: None present  Status is: Inpt  Dispo: The patient is from: Home              Anticipated d/c is to: Home              Anticipated d/c date is: 24-48h              Patient currently NOT medically stable for discharge given ongoing hyponatremia  Consultants:  None  Procedures:  None  Antimicrobials:  None   Subjective: No acute issues/events overnight - patient concerned AS related to edema but discussed with him that we called his vascular team and they think his symptoms are more likely secondary to hypothyroidism.  Objective: Vitals:   05/30/22 2156 05/30/22 2208 05/31/22 0200 05/31/22 0539  BP: 123/87  (!) 117/48 122/73  Pulse: 89  67 77  Resp: 19  19 19   Temp: 97.6 F (36.4 C)  98.1 F (36.7 C) 98.8 F (37.1 C)  TempSrc:   Oral Oral  SpO2: 97%   98%  Weight:  68.9 kg    Height:  5\' 9"  (1.753 m)      Intake/Output Summary (Last 24 hours) at 05/31/2022 [id number] Last data filed at 05/31/2022 0600 Gross per 24 hour  Intake 1553.05 ml  Output 900 ml  Net 653.05 ml   Filed Weights   05/30/22 1052 05/30/22 2208  Weight: 70.9 kg 68.9 kg    Examination:  General:  Pleasantly resting in bed, No acute distress. HEENT:  Normocephalic  atraumatic.  Sclerae nonicteric, noninjected.  Extraocular movements intact bilaterally. Neck:  Without mass or deformity.  Trachea is midline. Lungs:  Clear to auscultate bilaterally without rhonchi, wheeze, or rales. Heart:  Irregularly irregular - profound 5/6 crescendo/decrescendo systolic murmur Abdomen:  Soft, nontender, nondistended.  Without guarding or rebound. Extremities: Without cyanosis, clubbing, 1-2+ pitting edema  distal to knees bilaterally. Vascular:  Dorsalis pedis and posterior tibial pulses palpable bilaterally. Skin:  Warm and dry, no erythema, no ulcerations.  Data Reviewed: I have personally reviewed following labs and imaging studies  CBC: Recent Labs  Lab 05/30/22 1056 05/31/22 0523  WBC 5.9 4.8  HGB 9.7* 9.9*  HCT 28.2* 29.4*  MCV 79.9* 81.7  PLT 216 [id number]   Basic Metabolic Panel: Recent Labs  Lab 05/30/22 1056 05/30/22 2320 05/31/22 0523  NA 120* 125* 126*  K 3.5  --  3.5  CL 88*  --  95*  CO2 21*  --  25  GLUCOSE 125*  --  98  BUN 11  --  9  CREATININE 0.94  --  0.85  CALCIUM 8.9  --  8.3*  MG 1.5*  --  2.0  PHOS 3.2  --   --    GFR: Estimated Creatinine Clearance: 59.7 mL/min (by C-G formula based on SCr of 0.85 mg/dL). Liver Function Tests: Recent Labs  Lab 05/30/22 1056  AST 22  ALT 16  ALKPHOS 70  BILITOT 0.5  PROT 6.3*  ALBUMIN 3.7   No results found for this or any previous visit (from the past 240 hour(s)).    Radiology Studies: US Venous Img Lower Bilateral (DVT)  Result Date: 05/30/2022 CLINICAL DATA:  Leg swelling bilaterally for 3 weeks, shortness of breath for 3-4 days EXAM: BILATERAL LOWER EXTREMITY VENOUS DOPPLER ULTRASOUND TECHNIQUE: Gray-scale sonography with compression, as well as color and duplex ultrasound, were performed to evaluate the deep venous system(s) from the level of the common femoral vein through the popliteal and proximal calf veins. COMPARISON:  None Available. FINDINGS: VENOUS Normal compressibility of BILATERAL common femoral, superficial femoral, and popliteal veins, as well as the visualized calf veins. Visualized portions of BILATERAL profunda femoral vein and great saphenous vein unremarkable. No filling defects to suggest DVT on grayscale or color Doppler imaging. Doppler waveforms show normal direction of venous flow, normal respiratory plasticity and response to augmentation. OTHER Scattered subcutaneous edema BILATERAL  lower extremities. Limitations: none IMPRESSION: No evidence of deep venous thrombosis in either lower extremity. Electronically Signed   By: Lavonia Dana M.D.   On: 05/30/2022 13:27   DG Chest Portable 1 View  Result Date: 05/30/2022 CLINICAL DATA:  Shortness of breath EXAM: PORTABLE CHEST 1 VIEW COMPARISON:  04/12/2022 FINDINGS: Hyperinflation. No pneumothorax, effusion or edema. No consolidation. There is some linear opacity lung bases likely scar or atelectasis. Overlapping cardiac leads. Tortuous and ectatic aorta. IMPRESSION: Hyperinflation.  Basilar atelectasis or scar.  No consolidation. Electronically Signed   By: Jill Side M.D.   On: 05/30/2022 12:06    Scheduled Meds:  apixaban  5 mg Oral BID   diltiazem  180 mg Oral BID   levothyroxine  75 mcg Oral Q0600   mometasone-formoterol  2 puff Inhalation BID   montelukast  10 mg Oral QHS   rosuvastatin  5 mg Oral Daily   sodium chloride flush  3 mL Intravenous Q12H   Continuous Infusions:  sodium chloride 75 mL/hr at 05/31/22 0008     LOS: 1 day   Time spent:  80 min  Little Ishikawa, DO [facility]  If 7PM-7AM, please contact night-coverage [email or web address]  05/31/2022, 7:02 AM

## 2022-05-31 NOTE — Plan of Care (Signed)
  Problem: Education: Goal: Knowledge of General Education information will improve Description Including pain rating scale, medication(s)/side effects and non-pharmacologic comfort measures Outcome: Progressing   

## 2022-05-31 NOTE — Telephone Encounter (Signed)
Dr. Acie Fredrickson spoke with Dr. Avon Gully. Does not recommend repeat echocardiogram, will keep planned repeat as scheduled in June.

## 2022-06-01 ENCOUNTER — Ambulatory Visit: Payer: Medicare Other

## 2022-06-01 DIAGNOSIS — E871 Hypo-osmolality and hyponatremia: Secondary | ICD-10-CM | POA: Diagnosis not present

## 2022-06-01 LAB — BASIC METABOLIC PANEL
Anion gap: 9 (ref 5–15)
BUN: 10 mg/dL (ref 8–23)
CO2: 24 mmol/L (ref 22–32)
Calcium: 8.6 mg/dL — ABNORMAL LOW (ref 8.9–10.3)
Chloride: 97 mmol/L — ABNORMAL LOW (ref 98–111)
Creatinine, Ser: 0.91 mg/dL (ref 0.61–1.24)
GFR, Estimated: >60 mL/min (ref >60)
Glucose, Bld: 95 mg/dL (ref 70–99)
Potassium: 3.9 mmol/L (ref 3.5–5.1)
Sodium: 130 mmol/L — ABNORMAL LOW (ref 135–145)

## 2022-06-01 LAB — ACTH STIMULATION, 3 TIME POINTS
Cortisol, 30 Min: 8.2 ug/dL
Cortisol, 60 Min: 10.9 ug/dL
Cortisol, Base: 1.7 ug/dL

## 2022-06-01 MED ORDER — LEVOTHYROXINE SODIUM 100 MCG PO TABS: 100.0000 ug | ORAL_TABLET | Freq: Every day | ORAL | 0 refills | Status: DC

## 2022-06-01 MED ORDER — COSYNTROPIN 0.25 MG IJ SOLR
0.2500 mg | Freq: Once | INTRAMUSCULAR | Status: AC
  Administered 2022-06-01: 0.25 mg via INTRAVENOUS
  Filled 2022-06-01: qty 0.25

## 2022-06-01 NOTE — Discharge Summary (Signed)
Physician Discharge Summary  Chad Morrison [id number] DOB: 09-22-1932 DOA: 05/30/2022  PCP: Haywood Pao, MD  Admit date: 05/30/2022 Discharge date: 06/01/2022  Admitted From: Home Disposition: Home  Recommendations for Outpatient Follow-up:  Follow up with PCP in 1-2 weeks -repeat sodium levels and thyroid levels as discussed Repeat cortisol stimulation testing once thryoid more appropriately controlled. Follow-up with cardiology/vascular in regards to aortic stenosis, repeat echo tentatively planned for June, keep this appointment  Home Health: None Equipment/Devices: No new equipment  Discharge Condition: Stable CODE STATUS: Full Diet recommendation: Regular diet  Brief/Interim Summary: Chad Morrison is a 89 y.o. male with medical history significant for granulomatosis with polyangiitis, severe persistent asthma with eosinophilia, permanent atrial fibrillation on Eliquis, severe aortic stenosis, HTN, HLD, hypothyroidism who presented to the ED for evaluation of swelling in his legs that began approximately 3 weeks prior to admission. No improvement with compression stockings/elevation. Has recently increased PO water intake and cut salt intake per outpatient recommendations.   Patient admitted as above with bilateral lower extremity edema and profound hyponatremia.  Initially thought to be due to changes in diet however patient's thyroid appears to be poorly supplemented, after IV levothyroxine titration of patient's home thyroid supplementation his edema and sodium resolved rather quickly.  Discussed case with patient's vascular team given his severe aortic stenosis, no indication for repeat echo at this time.  Of note patient's ACTH stimulation test appears to be abnormal, patient's base cortisol level is low, he did respond to stimulation but not as robustly as one would presume.  Unclear if this is secondary to symptomatic hypothyroidism or if this is a history of baseline.  Recommend  repeat testing with PCP once thyroid levels are more appropriately controlled.  Patient now appears to be back to baseline otherwise stable and agreeable for discharge home.  Discharge Diagnoses:  Principal Problem:   Hyponatremia Active Problems:   Permanent atrial fibrillation   Severe persistent asthma   Severe aortic stenosis   Limited granulomatosis with polyangiitis   Essential hypertension   Hyperlipidemia    Discharge Instructions   Allergies as of 06/01/2022       Reactions   Other Other (See Comments)   Latex Gloves   Penicillins Hives   Has patient had a PCN reaction causing immediate rash, facial/tongue/throat swelling, SOB or lightheadedness with hypotension: No Has patient had a PCN reaction causing severe rash involving mucus membranes or skin necrosis: No Has patient had a PCN reaction that required hospitalization: No Has patient had a PCN reaction occurring within the last 10 years: Yes If all of the above answers are "NO", then may proceed with Cephalosporin use.   Sulfa Antibiotics Other (See Comments)   Unknown to patient   Sulfamethoxazole-trimethoprim Other (See Comments)   Latex Hives, Rash        Medication List     TAKE these medications    albuterol 108 (90 Base) MCG/ACT inhaler Commonly known as: VENTOLIN HFA USE 2 INHALATIONS ORALLY   EVERY 4 HOURS AS NEEDED FORWHEEZING OR SHORTNESS OF   BREATH What changed: See the new instructions.   ALPRAZolam 0.25 MG tablet Commonly known as: XANAX Take 0.125 mg by mouth at bedtime.   apixaban 5 MG Tabs tablet Commonly known as: Eliquis Take 1 tablet (5 mg total) by mouth 2 (two) times daily.   azelastine 0.1 % nasal spray Commonly known as: ASTELIN Place 1 spray into both nostrils daily as needed for rhinitis or allergies.   budesonide-formoterol  160-4.5 MCG/ACT inhaler Commonly known as: SYMBICORT Inhale 2 puffs into the lungs 2 (two) times daily. What changed: Another medication with the  same name was removed. Continue taking this medication, and follow the directions you see here.   Calcium-Vitamin D 600-125 MG-UNIT Tabs Take 1 tablet by mouth 2 (two) times daily.   diltiazem 180 MG 24 hr capsule Commonly known as: CARDIZEM CD TAKE 1 CAPSULE EVERY       MORNING AND AT BEDTIME What changed: See the new instructions.   EPINEPHrine 0.3 mg/0.3 mL Soaj injection Commonly known as: EPI-PEN SMARTSIG:0.3 Milligram(s) IM Once What changed:  how much to take how to take this when to take this reasons to take this   esomeprazole 40 MG capsule Commonly known as: NEXIUM Take 1 capsule (40 mg total) by mouth 2 (two) times daily.   FASENRA [place] Inject into the skin every 8 (eight) weeks.   levocetirizine 5 MG tablet Commonly known as: XYZAL TAKE 1 TABLET EVERY EVENING   levothyroxine 100 MCG tablet Commonly known as: SYNTHROID Take 1 tablet (100 mcg total) by mouth daily at 6 (six) AM. Start taking on: June 02, 2022 What changed:  medication strength how much to take when to take this   montelukast 10 MG tablet Commonly known as: SINGULAIR Take 1 tablet (10 mg total) by mouth at bedtime.   multivitamin with minerals Tabs tablet Take 1 tablet daily by mouth.   OCUVITE PRESERVISION PO Take 1 tablet 2 (two) times daily by mouth.   rosuvastatin 5 MG tablet Commonly known as: CRESTOR TAKE 1 TABLET DAILY   SLOW FE PO Take 1 tablet by mouth every morning.   SYSTANE OP Place 1 drop into both eyes daily as needed (Dry eye).   VITAMIN C PO Take 1 tablet by mouth every morning. Not sure the dosage        Allergies  Allergen Reactions   Other Other (See Comments)    Latex Gloves   Penicillins Hives    Has patient had a PCN reaction causing immediate rash, facial/tongue/throat swelling, SOB or lightheadedness with hypotension: No Has patient had a PCN reaction causing severe rash involving mucus membranes or skin necrosis: No Has patient had a PCN reaction  that required hospitalization: No Has patient had a PCN reaction occurring within the last 10 years: Yes If all of the above answers are "NO", then may proceed with Cephalosporin use.    Sulfa Antibiotics Other (See Comments)    Unknown to patient    Sulfamethoxazole-Trimethoprim Other (See Comments)   Latex Hives and Rash    Consultations: None  Procedures/Studies: US Venous Img Lower Bilateral (DVT)  Result Date: 05/30/2022 CLINICAL DATA:  Leg swelling bilaterally for 3 weeks, shortness of breath for 3-4 days EXAM: BILATERAL LOWER EXTREMITY VENOUS DOPPLER ULTRASOUND TECHNIQUE: Gray-scale sonography with compression, as well as color and duplex ultrasound, were performed to evaluate the deep venous system(s) from the level of the common femoral vein through the popliteal and proximal calf veins. COMPARISON:  None Available. FINDINGS: VENOUS Normal compressibility of BILATERAL common femoral, superficial femoral, and popliteal veins, as well as the visualized calf veins. Visualized portions of BILATERAL profunda femoral vein and great saphenous vein unremarkable. No filling defects to suggest DVT on grayscale or color Doppler imaging. Doppler waveforms show normal direction of venous flow, normal respiratory plasticity and response to augmentation. OTHER Scattered subcutaneous edema BILATERAL lower extremities. Limitations: none IMPRESSION: No evidence of deep venous thrombosis in either  lower extremity. Electronically Signed   By: Lavonia Dana M.D.   On: 05/30/2022 13:27   DG Chest Portable 1 View  Result Date: 05/30/2022 CLINICAL DATA:  Shortness of breath EXAM: PORTABLE CHEST 1 VIEW COMPARISON:  04/12/2022 FINDINGS: Hyperinflation. No pneumothorax, effusion or edema. No consolidation. There is some linear opacity lung bases likely scar or atelectasis. Overlapping cardiac leads. Tortuous and ectatic aorta. IMPRESSION: Hyperinflation.  Basilar atelectasis or scar.  No consolidation. Electronically  Signed   By: Jill Side M.D.   On: 05/30/2022 12:06     Subjective: No acute issues or events overnight, feels quite well otherwise stable and agreeable for discharge home.  Denies nausea vomiting diarrhea constipation headache fevers chills or chest pain.   Discharge Exam: Vitals:   06/01/22 0454 06/01/22 0754  BP: 130/68   Pulse: 81   Resp: 17   Temp: 98.2 F (36.8 C)   SpO2: 92% 92%   Vitals:   05/31/22 2005 05/31/22 2050 06/01/22 0454 06/01/22 0754  BP:  128/71 130/68   Pulse:  74 81   Resp:  17 17   Temp:  98.2 F (36.8 C) 98.2 F (36.8 C)   TempSrc:  Oral Oral   SpO2: 96% 97% 92% 92%  Weight:      Height:        General: Pt is alert, awake, not in acute distress Cardiovascular: RRR, S1/S2 +, no rubs, no gallops Respiratory: CTA bilaterally, no wheezing, no rhonchi Abdominal: Soft, NT, ND, bowel sounds + Extremities: no edema, no cyanosis    The results of significant diagnostics from this hospitalization (including imaging, microbiology, ancillary and laboratory) are listed below for reference.     Microbiology: No results found for this or any previous visit (from the past 240 hour(s)).   Labs: BNP (last 3 results) No results for input(s): "BNP" in the last 8760 hours. Basic Metabolic Panel: Recent Labs  Lab 05/30/22 1056 05/30/22 2320 05/31/22 0523 06/01/22 0802  NA 120* 125* 126* 130*  K 3.5  --  3.5 3.9  CL 88*  --  95* 97*  CO2 21*  --  25 24  GLUCOSE 125*  --  98 95  BUN 11  --  9 10  CREATININE 0.94  --  0.85 0.91  CALCIUM 8.9  --  8.3* 8.6*  MG 1.5*  --  2.0  --   PHOS 3.2  --   --   --    Liver Function Tests: Recent Labs  Lab 05/30/22 1056  AST 22  ALT 16  ALKPHOS 70  BILITOT 0.5  PROT 6.3*  ALBUMIN 3.7   No results for input(s): "LIPASE", "AMYLASE" in the last 168 hours. No results for input(s): "AMMONIA" in the last 168 hours. CBC: Recent Labs  Lab 05/30/22 1056 05/31/22 0523  WBC 5.9 4.8  HGB 9.7* 9.9*  HCT  28.2* 29.4*  MCV 79.9* 81.7  PLT 216 210   Cardiac Enzymes: No results for input(s): "CKTOTAL", "CKMB", "CKMBINDEX", "TROPONINI" in the last 168 hours. BNP: Invalid input(s): "POCBNP" CBG: No results for input(s): "GLUCAP" in the last 168 hours. D-Dimer No results for input(s): "DDIMER" in the last 72 hours. Hgb A1c No results for input(s): "HGBA1C" in the last 72 hours. Lipid Profile No results for input(s): "CHOL", "HDL", "LDLCALC", "TRIG", "CHOLHDL", "LDLDIRECT" in the last 72 hours. Thyroid function studies Recent Labs    05/31/22 0523  TSH 5.815*   Anemia work up No results for input(s): "VITAMINB12", "  FOLATE", "FERRITIN", "TIBC", "IRON", "RETICCTPCT" in the last 72 hours. Urinalysis    Component Value Date/Time   COLORURINE YELLOW 05/30/2022 1225   APPEARANCEUR CLEAR 05/30/2022 1225   LABSPEC 1.009 05/30/2022 1225   PHURINE 5.0 05/30/2022 1225   GLUCOSEU NEGATIVE 05/30/2022 1225   HGBUR NEGATIVE 05/30/2022 [place] 05/30/2022 1225   KETONESUR NEGATIVE 05/30/2022 1225   PROTEINUR NEGATIVE 05/30/2022 1225   NITRITE NEGATIVE 05/30/2022 1225   LEUKOCYTESUR NEGATIVE 05/30/2022 1225   Sepsis Labs Recent Labs  Lab 05/30/22 1056 05/31/22 0523  WBC 5.9 4.8   Microbiology No results found for this or any previous visit (from the past 240 hour(s)).   Time coordinating discharge: Over 30 minutes  SIGNED:   Little Ishikawa, DO [facility] 06/01/2022, 12:52 PM Pager   If 7PM-7AM, please contact night-coverage [email or web address]

## 2022-06-01 NOTE — Progress Notes (Signed)
Patient and spouse given discharge, follow up, and medication instructions, verbalized understanding, IV and telemetry monitor removed, personal belongings with patient, spouse to transport home  

## 2022-06-01 NOTE — Plan of Care (Signed)

## 2022-06-01 NOTE — Progress Notes (Signed)
Mobility Specialist - Progress Note   06/01/22 1035  Mobility  Activity Ambulated with assistance in hallway  Level of Assistance Contact guard assist, steadying assist  Assistive Device Other (Comment) (hand held assist)  Distance Ambulated (ft) 350 ft  Activity Response Tolerated well  Mobility Referral Yes  $Mobility charge 1 Mobility   Pt received in bed and agreed to mobility, with no issues throughout session. Pt returned to chair with all needs met.  Roderick Pee Mobility Specialist

## 2022-06-06 ENCOUNTER — Ambulatory Visit (HOSPITAL_COMMUNITY)
Admission: RE | Admit: 2022-06-06 | Discharge: 2022-06-06 | Disposition: A | Discharge status: Home / Self Care | Payer: Medicare Other | Source: Ambulatory Visit | Attending: Pulmonary Disease | Admitting: Pulmonary Disease

## 2022-06-06 DIAGNOSIS — R918 Other nonspecific abnormal finding of lung field: Secondary | ICD-10-CM | POA: Diagnosis not present

## 2022-06-06 DIAGNOSIS — R0602 Shortness of breath: Secondary | ICD-10-CM | POA: Diagnosis not present

## 2022-06-06 DIAGNOSIS — J90 Pleural effusion, not elsewhere classified: Secondary | ICD-10-CM | POA: Diagnosis not present

## 2022-06-07 ENCOUNTER — Encounter: Payer: Self-pay | Admitting: Family

## 2022-06-07 ENCOUNTER — Ambulatory Visit: Payer: Medicare Other | Admitting: Family

## 2022-06-07 ENCOUNTER — Telehealth: Payer: Self-pay | Admitting: Allergy and Immunology

## 2022-06-07 ENCOUNTER — Ambulatory Visit (INDEPENDENT_AMBULATORY_CARE_PROVIDER_SITE_OTHER): Payer: Medicare Other | Admitting: *Deleted

## 2022-06-07 ENCOUNTER — Other Ambulatory Visit: Payer: Self-pay | Admitting: *Deleted

## 2022-06-07 VITALS — BP 120/60 | HR 74 | Resp 20

## 2022-06-07 DIAGNOSIS — J4550 Severe persistent asthma, uncomplicated: Secondary | ICD-10-CM | POA: Diagnosis not present

## 2022-06-07 DIAGNOSIS — K219 Gastro-esophageal reflux disease without esophagitis: Secondary | ICD-10-CM

## 2022-06-07 DIAGNOSIS — R0682 Tachypnea, not elsewhere classified: Secondary | ICD-10-CM | POA: Diagnosis not present

## 2022-06-07 DIAGNOSIS — J3489 Other specified disorders of nose and nasal sinuses: Secondary | ICD-10-CM | POA: Diagnosis not present

## 2022-06-07 DIAGNOSIS — J3089 Other allergic rhinitis: Secondary | ICD-10-CM

## 2022-06-07 DIAGNOSIS — M3130 Wegener's granulomatosis without renal involvement: Secondary | ICD-10-CM

## 2022-06-07 DIAGNOSIS — D472 Monoclonal gammopathy: Secondary | ICD-10-CM

## 2022-06-07 MED ORDER — SYMBICORT 160-4.5 MCG/ACT IN AERO: 2.0000 | INHALATION_SPRAY | Freq: Two times a day (BID) | RESPIRATORY_TRACT | 1 refills | Status: DC

## 2022-06-07 MED ORDER — BENRALIZUMAB 30 MG/ML ~~LOC~~ SOSY
30.0000 mg | PREFILLED_SYRINGE | SUBCUTANEOUS | Status: AC
  Administered 2022-06-07 – 2024-02-13 (×12): 30 mg via SUBCUTANEOUS

## 2022-06-07 NOTE — Progress Notes (Addendum)
[address] [place] [id number] Dept: [phone]  FOLLOW UP NOTE  Patient ID: Chad Morrison, Chad Morrison    DOB: 07/20/1932  Age: 89 y.o. MRN: [id number] Date of Office Visit: 06/07/2022  Assessment  Chief Complaint: Breathing Problem  HPI Chad Morrison is an 89 year old Chad Morrison who presents today as an acute visit of feeling like he is  breathing faster.  He was last seen on December 21, 2021 by Dr. 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  His wife is here with him today and provides history.  She reports since his last office visit he has been admitted to the hospital in February with pneumonia and here recently in early April for hyponatremia, anemia, and leg swelling.  He reports that he has an upcoming appointment tomorrow with his primary care physician to discuss his leg swelling and having to wear compression hose.  He reports bilateral leg swelling.  His left leg is greater than right leg.  He did have a ultrasound venous imaging lower bilateral that showed: "No evidence of deep vein thrombosis in either lower extremity."  Has a history of A-fib and is on Eliquis.  He does continue to follow-up with [facility] for granulomatosis with polyangiitis.  Asthma: He reports around 2 PM he received his Fasenra injection and then around 3:30 PM he felt like he was breathing faster.  He denies any concomitant gastrointestinal and cutaneous symptoms.  He reports that his abdomen was bothering him prior to getting his Fasenra injection.  He has been on [place] since 2021.  He denies any previous problems or reactions with Fasenra.  He wondered if Harrington ChallengerFasenra caused this symptom today.  Physical exam and vitals stable.  He did try using his albuterol for the sensation of feeling like he was breathing faster and it did not help.  He  denies coughing, wheezing, tightness in chest, and shortness of breath.  He continues to take Symbicort 160/4.5 mcg 2 puffs twice a day with a spacer.  He does mention that ever since he has had pneumonia and that his breathing has not been as good.  He did follow-up with the pulmonologist and was told it could take 6 to 9 months for his lungs to get better.  He did mention that he had a CT of his chest yesterday.  He does not know the results of this yet.  Towards the end of his office visit he reports that his breathing was back to baseline.  His high-resolution CT chest from June 06, 2022 shows:  "IMPRESSION: 1. Mild mosaic attenuation with evidence of air trapping, findings can be seen in the setting of small airways disease. 2. Trace bilateral pleural effusions. 3. Mild lower lung predominant linear opacities which are likely due to scarring or atelectasis, although severe respiratory motion artifact at the lung bases limits evaluation. 4. Solid pulmonary nodule of the left upper lobe measuring 6 mm. Non-contrast chest CT at 6-12 months is recommended. If the nodule is stable at time of repeat CT, then future CT at 18-24 months (from today's scan) is considered optional for low-risk patients, but is recommended for high-risk patients. This recommendation follows the consensus statement: Guidelines for Management of Incidental Pulmonary Nodules Detected on CT Images: From the Fleischner Society 2017; Radiology 2017; 284:228-243. 5. Aortic Atherosclerosis (ICD10-I70.0)."    Drug Allergies:  Allergies  Allergen Reactions   Other  Other (See Comments)    Latex Gloves   Penicillins Hives    Has patient had a PCN reaction causing immediate rash, facial/tongue/throat swelling, SOB or lightheadedness with hypotension: No Has patient had a PCN reaction causing severe rash involving mucus membranes or skin necrosis: No Has patient had a PCN reaction that required hospitalization: No Has  patient had a PCN reaction occurring within the last 10 years: Yes If all of the above answers are "NO", then may proceed with Cephalosporin use.    Sulfa Antibiotics Other (See Comments)    Unknown to patient    Sulfamethoxazole-Trimethoprim Other (See Comments)   Latex Hives and Rash    Review of Systems: Review of Systems  Constitutional:  Positive for chills. Negative for fever.       Reports chills that he feels is due to his low TSH.  Denies fevers  Respiratory:  Negative for cough, shortness of breath and wheezing.        Reports feeling like he was breathing faster.  Denies coughing, wheezing, tightness in chest, shortness of breath  Cardiovascular:  Negative for chest pain and palpitations.  Gastrointestinal:        Denies heartburn or reflux symptoms  Skin:  Negative for itching and rash.  Endo/Heme/Allergies:  Positive for environmental allergies.     Physical Exam: BP 120/60   Pulse 74   Resp 20   SpO2 98%    Physical Exam Exam conducted with a chaperone present.  Constitutional:      Appearance: Normal appearance.  HENT:     Head: Normocephalic and atraumatic.     Comments: Pharynx normal, eyes normal, ears normal, nose: Septal perforation with clear borders noted    Right Ear: Tympanic membrane, ear canal and external ear normal.     Left Ear: Tympanic membrane, ear canal and external ear normal.     Mouth/Throat:     Mouth: Mucous membranes are moist.     Pharynx: Oropharynx is clear.  Eyes:     Conjunctiva/sclera: Conjunctivae normal.  Cardiovascular:     Rate and Rhythm: Rhythm irregular.     Comments: History of A-fib on and on Eliquis.  Heart murmur heard Musculoskeletal:     Cervical back: Neck supple.  Skin:    General: Skin is warm.  Neurological:     Mental Status: He is alert and oriented to person, place, and time.  Psychiatric:        Mood and Affect: Mood normal.        Behavior: Behavior normal.        Thought Content: Thought  content normal.        Judgment: Judgment normal.     Diagnostics: FVC 3.05 L (86%), FEV1 2.00 L (77%).  Spirometry indicates normal spirometry.  He reports that he used his albuterol inhaler 2 hours prior to this spirometry.  Assessment and Plan: 1. Tachypnea   2. Severe persistent asthma without complication   3. Perennial allergic rhinitis   4. Nasal septal perforation   5. LPRD (laryngopharyngeal reflux disease)   6. Monoclonal gammopathy   7. Granulomatosis with polyangiitis without renal involvement     No orders of the defined types were placed in this encounter.   Patient Instructions   1.  Continue to treat and prevent inflammation:   A. Symbicort 160 - 2 inhalations 1-2 times per day    B. Montelukast 10 mg - 1 tablet 1 time per day  C. Benralizumab injections  2.  Continue to treat and prevent reflux:   A. Minimize caffeine consumption   B. Nexium 40 mg - 1-2 times per day  3. If needed:   A. albuterol HFA - 2 inhalations every 4-6 hours  B.  Azelastine-1-2 sprays each nostril 1-2 times per day  4.  You are already scheduled follow-up appointment on June 28, 2022 at 10:10 AM with Dr. Lucie Leather or earlier if problem  If symptoms return please go to the Emergency Room Keep your follow up appointment with your primary care physician tomorrow   Return in about 3 weeks (around 06/28/2022), or if symptoms worsen or fail to improve.    Thank you for the opportunity to care for this patient.  Please do not hesitate to contact me with questions.  Nehemiah Settle, FNP [facility]

## 2022-06-07 NOTE — Telephone Encounter (Signed)
Called and spoke with patient and advised of refills being sent in. Patient verbalized understanding.

## 2022-06-07 NOTE — Telephone Encounter (Signed)
Chad Morrison would like Symbicort sent to [facility].  He would like 90 day supply with 3 refills please.

## 2022-06-07 NOTE — Patient Instructions (Addendum)
  1.  Continue to treat and prevent inflammation:   A. Symbicort 160 - 2 inhalations 1-2 times per day    B. Montelukast 10 mg - 1 tablet 1 time per day  C. Benralizumab injections    2.  Continue to treat and prevent reflux:   A. Minimize caffeine consumption   B. Nexium 40 mg - 1-2 times per day  3. If needed:   A. albuterol HFA - 2 inhalations every 4-6 hours  B.  Azelastine-1-2 sprays each nostril 1-2 times per day  4.  You are already scheduled follow-up appointment on June 28, 2022 at 10:10 AM with Dr. Lucie Leather or earlier if problem  If symptoms return please go to the Emergency Room Keep your follow up appointment with your primary care physician tomorrow

## 2022-06-08 DIAGNOSIS — E871 Hypo-osmolality and hyponatremia: Secondary | ICD-10-CM | POA: Diagnosis not present

## 2022-06-08 DIAGNOSIS — J849 Interstitial pulmonary disease, unspecified: Secondary | ICD-10-CM | POA: Diagnosis not present

## 2022-06-08 DIAGNOSIS — E039 Hypothyroidism, unspecified: Secondary | ICD-10-CM | POA: Diagnosis not present

## 2022-06-08 DIAGNOSIS — R946 Abnormal results of thyroid function studies: Secondary | ICD-10-CM | POA: Diagnosis not present

## 2022-06-08 DIAGNOSIS — R600 Localized edema: Secondary | ICD-10-CM | POA: Diagnosis not present

## 2022-06-08 DIAGNOSIS — M3130 Wegener's granulomatosis without renal involvement: Secondary | ICD-10-CM | POA: Diagnosis not present

## 2022-06-08 DIAGNOSIS — R0600 Dyspnea, unspecified: Secondary | ICD-10-CM | POA: Diagnosis not present

## 2022-06-08 DIAGNOSIS — J4540 Moderate persistent asthma, uncomplicated: Secondary | ICD-10-CM | POA: Diagnosis not present

## 2022-06-08 DIAGNOSIS — R0602 Shortness of breath: Secondary | ICD-10-CM | POA: Diagnosis not present

## 2022-06-08 DIAGNOSIS — I4892 Unspecified atrial flutter: Secondary | ICD-10-CM | POA: Diagnosis not present

## 2022-06-08 DIAGNOSIS — D649 Anemia, unspecified: Secondary | ICD-10-CM | POA: Diagnosis not present

## 2022-06-08 DIAGNOSIS — I350 Nonrheumatic aortic (valve) stenosis: Secondary | ICD-10-CM | POA: Diagnosis not present

## 2022-06-08 DIAGNOSIS — I700 Atherosclerosis of aorta: Secondary | ICD-10-CM | POA: Diagnosis not present

## 2022-06-08 DIAGNOSIS — Z7901 Long term (current) use of anticoagulants: Secondary | ICD-10-CM | POA: Diagnosis not present

## 2022-06-10 ENCOUNTER — Ambulatory Visit: Payer: Medicare Other | Attending: Nurse Practitioner | Admitting: Nurse Practitioner

## 2022-06-10 ENCOUNTER — Encounter: Payer: Self-pay | Admitting: Nurse Practitioner

## 2022-06-10 VITALS — BP 112/50 | HR 64 | Ht 69.0 in | Wt 154.6 lb

## 2022-06-10 DIAGNOSIS — R946 Abnormal results of thyroid function studies: Secondary | ICD-10-CM | POA: Diagnosis not present

## 2022-06-10 DIAGNOSIS — J849 Interstitial pulmonary disease, unspecified: Secondary | ICD-10-CM | POA: Diagnosis not present

## 2022-06-10 DIAGNOSIS — I4821 Permanent atrial fibrillation: Secondary | ICD-10-CM

## 2022-06-10 DIAGNOSIS — R600 Localized edema: Secondary | ICD-10-CM | POA: Diagnosis not present

## 2022-06-10 DIAGNOSIS — I350 Nonrheumatic aortic (valve) stenosis: Secondary | ICD-10-CM | POA: Diagnosis not present

## 2022-06-10 DIAGNOSIS — M3130 Wegener's granulomatosis without renal involvement: Secondary | ICD-10-CM | POA: Diagnosis not present

## 2022-06-10 DIAGNOSIS — Z7901 Long term (current) use of anticoagulants: Secondary | ICD-10-CM | POA: Diagnosis not present

## 2022-06-10 DIAGNOSIS — R0602 Shortness of breath: Secondary | ICD-10-CM | POA: Diagnosis not present

## 2022-06-10 DIAGNOSIS — I700 Atherosclerosis of aorta: Secondary | ICD-10-CM | POA: Diagnosis not present

## 2022-06-10 DIAGNOSIS — I10 Essential (primary) hypertension: Secondary | ICD-10-CM | POA: Diagnosis not present

## 2022-06-10 DIAGNOSIS — J4540 Moderate persistent asthma, uncomplicated: Secondary | ICD-10-CM | POA: Diagnosis not present

## 2022-06-10 DIAGNOSIS — I4892 Unspecified atrial flutter: Secondary | ICD-10-CM | POA: Diagnosis not present

## 2022-06-10 DIAGNOSIS — E871 Hypo-osmolality and hyponatremia: Secondary | ICD-10-CM | POA: Diagnosis not present

## 2022-06-10 DIAGNOSIS — D649 Anemia, unspecified: Secondary | ICD-10-CM | POA: Diagnosis not present

## 2022-06-10 LAB — LAB REPORT - SCANNED: EGFR: 70.7

## 2022-06-10 NOTE — Patient Instructions (Signed)
Medication Instructions:  Your physician has recommended you make the following change in your medication:   INCREASE Lasix to twice a day for 2 days then go back to the 1 daily  *If you need a refill on your cardiac medications before your next appointment, please call your pharmacy*   Lab Work: None ordered  If you have labs (blood work) drawn today and your tests are completely normal, you will receive your results only by: MyChart Message (if you have MyChart) OR A paper copy in the mail If you have any lab test that is abnormal or we need to change your treatment, we will call you to review the results.   Testing/Procedures: None ordered   Follow-Up: At [facility], you and your health needs are our priority.  As part of our continuing mission to provide you with exceptional heart care, we have created designated Provider Care Teams.  These Care Teams include your primary Cardiologist (physician) and Advanced Practice Providers (APPs -  Physician Assistants and Nurse Practitioners) who all work together to provide you with the care you need, when you need it.  We recommend signing up for the patient portal called "MyChart".  Sign up information is provided on this After Visit Summary.  MyChart is used to connect with patients for Virtual Visits (Telemedicine).  Patients are able to view lab/test results, encounter notes, upcoming appointments, etc.  Non-urgent messages can be sent to your provider as well.   To learn more about what you can do with MyChart, go to [email or web address].    Your next appointment:   As scheduled  Provider:   Orbie Pyo, MD     Other Instructions

## 2022-06-10 NOTE — Progress Notes (Signed)
Office Visit    Patient Name: Chad Morrison Date of Encounter: 06/10/2022  Primary Care Provider:  Gaspar Garbe, MD Primary Cardiologist:  Orbie Pyo, MD Primary Electrophysiologist: None  Chief Complaint    Chad Morrison is a 89 y.o. male with PMH of permanent AF (on Eliquis), RBBB, severe aortic stenosis, HTN, HLD, hypothyroidism, severe persistent asthma, limited granulomatous with polyangiitis, who presents today for post hospital follow-up.  Past Medical History    Past Medical History:  Diagnosis Date   Anxiety    Asthma    Asthmatic bronchitis    Atrial flutter    chads2vasc score is 3   Ehrlichiosis 2008   GERD (gastroesophageal reflux disease)    Granulomatosis with polyangiitis    HTN (hypertension)    Hyperlipidemia    Nuclear sclerotic cataract of left eye 06/04/2019   Cataract surgery completed March 2023, Dr. Vonna Kotyk   Paroxysmal atrial fibrillation    Prostate cancer    Seasonal allergies    Past Surgical History:  Procedure Laterality Date   BASAL CELL CARCINOMA EXCISION  2013   EYELID   HERNIA REPAIR Bilateral 2000, 2002   KNEE ARTHROSCOPY  2000   prostectomy  1995   RADICAL   TONSILLECTOMY  1940    Allergies  Allergies  Allergen Reactions   Other Other (See Comments)    Latex Gloves   Penicillins Hives    Has patient had a PCN reaction causing immediate rash, facial/tongue/throat swelling, SOB or lightheadedness with hypotension: No Has patient had a PCN reaction causing severe rash involving mucus membranes or skin necrosis: No Has patient had a PCN reaction that required hospitalization: No Has patient had a PCN reaction occurring within the last 10 years: Yes If all of the above answers are "NO", then may proceed with Cephalosporin use.    Sulfa Antibiotics Other (See Comments)    Unknown to patient    Sulfamethoxazole-Trimethoprim Other (See Comments)   Latex Hives and Rash    History of Present Illness    Jusitn Morrison  is  a 89 year old male.  With the above mention past medical history who presents today for post hospital follow-up.  He has been followed by Dr. Johney Frame for management of atrial fibrillation in the past and has recently establish care with Dr. Lynnette Caffey for management of severe aortic stenosis.  Most recent 2D echo demonstrated severe aortic stenosis with heavily calcified valve mean gradient of 33 mmHg with EF of 55-60%.  He was last seen by Dr. Lynnette Caffey in 04/14/2022 to discuss progressive severe aortic stenosis.  He was seen in the ED on 04/12/2022 for pneumonia and treated with antibiotics.  He reported being fairly active but still endorse shortness of breath with activity.  He denied any syncope, chest pain or presyncope.  He was recently admitted with bilateral lower extremity edema and profound hyponatremia.  He was found to have poorly supplemented thyroid and after IV levothyroxine his edema and sodium deficit improved quickly.  He was seen by his PCP on 06/09/2021 and had 3+ pitting edema with BNP elevated at 399.  He noted no improvement to his edema with compression stockings or elevation.  [name] presents today for posthospital follow-up and lower extremity edema.  Since last being seen in the office patient reports that he is now experiencing any shortness of breath that has experienced ongoing lower extremity edema.  He has had some relief with Lasix 40 mg.  His blood pressure  today was 112/50 and heart rate was 64 bpm.  He had recent lab work completed by his PCP.  He does note some shortness of breath with increased physical activity has been weighing himself daily and notes 1 to 2 pounds of weight loss.  He has 2+ bilateral pitting edema and lungs are clear to auscultation.  Patient denies chest pain, palpitations, dyspnea, PND, orthopnea, nausea, vomiting, dizziness, syncope, edema, weight gain, or early satiety.  Home Medications    Current Outpatient Medications  Medication Sig Dispense Refill    albuterol (VENTOLIN HFA) 108 (90 Base) MCG/ACT inhaler USE 2 INHALATIONS ORALLY   EVERY 4 HOURS AS NEEDED FORWHEEZING OR SHORTNESS OF   BREATH (Patient taking differently: Inhale 2 puffs into the lungs every 4 (four) hours as needed for wheezing or shortness of breath.) 18 g 1   ALPRAZolam (XANAX) 0.25 MG tablet Take 0.125 mg by mouth at bedtime.     apixaban (ELIQUIS) 5 MG TABS tablet Take 1 tablet (5 mg total) by mouth 2 (two) times daily. 180 tablet 2   Ascorbic Acid (VITAMIN C PO) Take 1 tablet by mouth every morning. Not sure the dosage     azelastine (ASTELIN) 0.1 % nasal spray Place 1 spray into both nostrils daily as needed for rhinitis or allergies.     Benralizumab (FASENRA [place]) Inject into the skin every 8 (eight) weeks.     budesonide-formoterol (SYMBICORT) 160-4.5 MCG/ACT inhaler Inhale 2 puffs into the lungs 2 (two) times daily.     Calcium Carbonate-Vitamin D (CALCIUM-VITAMIN D) 600-125 MG-UNIT TABS Take 1 tablet by mouth 2 (two) times daily.      diltiazem (CARDIZEM CD) 180 MG 24 hr capsule TAKE 1 CAPSULE EVERY       MORNING AND AT BEDTIME (Patient taking differently: Take 180 mg by mouth 2 (two) times daily.) 180 capsule 3   EPINEPHrine 0.3 mg/0.3 mL IJ SOAJ injection SMARTSIG:0.3 Milligram(s) IM Once (Patient taking differently: Inject 0.3 mg into the muscle as needed for anaphylaxis. SMARTSIG:0.3 Milligram(s) IM Once) 1 each 1   esomeprazole (NEXIUM) 40 MG capsule Take 1 capsule (40 mg total) by mouth 2 (two) times daily. 180 capsule 5   Ferrous Sulfate (SLOW FE PO) Take 1 tablet by mouth every morning.     levocetirizine (XYZAL) 5 MG tablet TAKE 1 TABLET EVERY EVENING 30 tablet 0   levothyroxine (SYNTHROID) 100 MCG tablet Take 1 tablet (100 mcg total) by mouth daily at 6 (six) AM. 30 tablet 0   montelukast (SINGULAIR) 10 MG tablet Take 1 tablet (10 mg total) by mouth at bedtime. 90 tablet 3   Multiple Vitamin (MULTIVITAMIN WITH MINERALS) TABS tablet Take 1 tablet daily by mouth.      Multiple Vitamins-Minerals (OCUVITE PRESERVISION PO) Take 1 tablet 2 (two) times daily by mouth.     Polyethyl Glycol-Propyl Glycol (SYSTANE OP) Place 1 drop into both eyes daily as needed (Dry eye).     rosuvastatin (CRESTOR) 5 MG tablet TAKE 1 TABLET DAILY 90 tablet 3   SYMBICORT 160-4.5 MCG/ACT inhaler Inhale 2 puffs into the lungs in the morning and at bedtime. 30.8 g 1   Current Facility-Administered Medications  Medication Dose Route Frequency Provider Last Rate Last Admin   Benralizumab SOSY 30 mg  30 mg Subcutaneous Q8 Weeks Kozlow, Alvira Philips, MD   30 mg at 06/07/22 1400     Review of Systems  Please see the history of present illness.    (+) Shortness of  breath with heavy exertion  All other systems reviewed and are otherwise negative except as noted above.  Physical Exam    Wt Readings from Last 3 Encounters:  05/30/22 151 lb 14.4 oz (68.9 kg)  05/06/22 152 lb 3.2 oz (69 kg)  04/25/22 154 lb 3.2 oz (69.9 kg)   ZO:XWRUE were no vitals filed for this visit.,There is no height or weight on file to calculate BMI.  Constitutional:      Appearance: Healthy appearance. Not in distress.  Neck:     Vascular: JVD normal.  Pulmonary:     Effort: Pulmonary effort is normal.     Breath sounds: No wheezing. No rales. Diminished in the bases Cardiovascular:     Irregularly irregular normal S1. Normal S2.      Murmurs: 3/6 systolic murmur Edema:    2+ bilateral pitting edema Abdominal:     Palpations: Abdomen is soft non tender. There is no hepatomegaly.  Skin:    General: Skin is warm and dry.  Neurological:     General: No focal deficit present.     Mental Status: Alert and oriented to person, place and time.     Cranial Nerves: Cranial nerves are intact.  EKG/LABS/ Recent Cardiac Studies    ECG personally reviewed by me today -atrial fibrillation with ventricular escape complex and RBBB and rate of 64 bpm.  Risk Assessment/Calculations:    CHA2DS2-VASc Score = 4    This indicates a 4.8% annual risk of stroke. The patient's score is based upon: CHF History: 0 HTN History: 1 Diabetes History: 0 Stroke History: 0 Vascular Disease History: 1 (seen on chest CT) Age Score: 2 Gender Score: 0           Lab Results  Component Value Date   WBC 4.8 05/31/2022   HGB 9.9 (L) 05/31/2022   HCT 29.4 (L) 05/31/2022   MCV 81.7 05/31/2022   PLT 210 05/31/2022   Lab Results  Component Value Date   CREATININE 0.91 06/01/2022   BUN 10 06/01/2022   NA 130 (L) 06/01/2022   K 3.9 06/01/2022   CL 97 (L) 06/01/2022   CO2 24 06/01/2022   Lab Results  Component Value Date   ALT 16 05/30/2022   AST 22 05/30/2022   ALKPHOS 70 05/30/2022   BILITOT 0.5 05/30/2022   No results found for: "CHOL", "HDL", "LDLCALC", "LDLDIRECT", "TRIG", "CHOLHDL"  No results found for: "HGBA1C"  Cardiac Studies & Procedures       ECHOCARDIOGRAM  ECHOCARDIOGRAM COMPLETE 04/11/2022  Narrative ECHOCARDIOGRAM REPORT    Patient Name:   Chad Morrison   Date of Exam: 04/11/2022 Medical Rec #:  [id number]     Height:       69.0 in Accession #:    [id number]    Weight:       150.2 lb Date of Birth:  03-02-1932      BSA:          1.829 m Patient Age:    89 years      BP:           140/74 mmHg Patient Gender: M             HR:           85 bpm. Exam Location:  [address]  Procedure: 2D Echo, Cardiac Doppler and Color Doppler  Indications:    I35.0 AS  History:        Patient has prior history of Echocardiogram  examinations, most recent 07/08/2021. AS, Arrythmias:Atrial Fibrillation; Risk Factors:Hypertension, Dyslipidemia and Former Smoker.  Sonographer:    Samule Ohm RDCS Referring Phys: [id number] Orbie Pyo  IMPRESSIONS   1. Left ventricular ejection fraction, by estimation, is 55 to 60%. The left ventricle has normal function. The left ventricle has no regional wall motion abnormalities. There is mild concentric left ventricular hypertrophy. Left ventricular  diastolic parameters are indeterminate. 2. Right ventricular systolic function is normal. The right ventricular size is normal. Tricuspid regurgitation signal is inadequate for assessing PA pressure. 3. Left atrial size was moderately dilated. 4. Right atrial size was moderately dilated. 5. The mitral valve is normal in structure. Mild mitral valve regurgitation. No evidence of mitral stenosis. 6. The aortic valve is tricuspid. There is severe calcifcation of the aortic valve. Aortic valve regurgitation is trivial. Severe aortic valve stenosis. Aortic valve area, by VTI measures 0.68 cm. Aortic valve mean gradient measures 42.5 mmHg. 7. Aortic dilatation noted. There is mild dilatation of the ascending aorta, measuring 39 mm. 8. The inferior vena cava is normal in size with greater than 50% respiratory variability, suggesting right atrial pressure of 3 mmHg. 9. The patient was in atrial fibrillation.  FINDINGS Left Ventricle: Left ventricular ejection fraction, by estimation, is 55 to 60%. The left ventricle has normal function. The left ventricle has no regional wall motion abnormalities. The left ventricular internal cavity size was normal in size. There is mild concentric left ventricular hypertrophy. Left ventricular diastolic parameters are indeterminate.  Right Ventricle: The right ventricular size is normal. No increase in right ventricular wall thickness. Right ventricular systolic function is normal. Tricuspid regurgitation signal is inadequate for assessing PA pressure. The tricuspid regurgitant velocity is 2.87 m/s, and with an assumed right atrial pressure of 3 mmHg, the estimated right ventricular systolic pressure is 35.9 mmHg.  Left Atrium: Left atrial size was moderately dilated.  Right Atrium: Right atrial size was moderately dilated.  Pericardium: There is no evidence of pericardial effusion.  Mitral Valve: The mitral valve is normal in structure. Mild mitral annular  calcification. Mild mitral valve regurgitation. No evidence of mitral valve stenosis.  Tricuspid Valve: The tricuspid valve is normal in structure. Tricuspid valve regurgitation is mild.  Aortic Valve: The aortic valve is tricuspid. There is severe calcifcation of the aortic valve. Aortic valve regurgitation is trivial. Severe aortic stenosis is present. Aortic valve mean gradient measures 42.5 mmHg. Aortic valve peak gradient measures 70.0 mmHg. Aortic valve area, by VTI measures 0.68 cm.  Pulmonic Valve: The pulmonic valve was normal in structure. Pulmonic valve regurgitation is trivial.  Aorta: The ascending aorta was not well visualized and aortic dilatation noted. There is mild dilatation of the ascending aorta, measuring 39 mm.  Venous: The inferior vena cava is normal in size with greater than 50% respiratory variability, suggesting right atrial pressure of 3 mmHg.  IAS/Shunts: No atrial level shunt detected by color flow Doppler.   LEFT VENTRICLE PLAX 2D LVIDd:         4.30 cm LVIDs:         2.70 cm LV PW:         1.20 cm LV IVS:        1.50 cm LVOT diam:     2.20 cm LV SV:         55 LV SV Index:   30 LVOT Area:     3.80 cm   RIGHT VENTRICLE  IVC RVSP:           35.9 mmHg  IVC diam: 0.90 cm  LEFT ATRIUM             Index        RIGHT ATRIUM           Index LA diam:        4.80 cm 2.62 cm/m   RA Pressure: 3.00 mmHg LA Vol (A2C):   81.1 ml 44.33 ml/m  RA Area:     29.70 cm LA Vol (A4C):   80.4 ml 43.95 ml/m  RA Volume:   107.00 ml 58.49 ml/m LA Biplane Vol: 85.2 ml 46.58 ml/m AORTIC VALVE AV Area (Vmax):    0.64 cm AV Area (Vmean):   0.63 cm AV Area (VTI):     0.68 cm AV Vmax:           418.40 cm/s AV Vmean:          280.000 cm/s AV VTI:            0.799 m AV Peak Grad:      70.0 mmHg AV Mean Grad:      42.5 mmHg LVOT Vmax:         70.94 cm/s LVOT Vmean:        46.460 cm/s LVOT VTI:          0.144 m LVOT/AV VTI ratio: 0.18  AORTA Ao Root  diam: 3.70 cm Ao Asc diam:  3.90 cm  MITRAL VALVE               TRICUSPID VALVE MV Area (PHT): 4.91 cm    TR Peak grad:   32.9 mmHg MV Decel Time: 155 msec    TR Vmax:        287.00 cm/s MV E velocity: 96.00 cm/s  Estimated RAP:  3.00 mmHg RVSP:           35.9 mmHg  SHUNTS Systemic VTI:  0.14 m Systemic Diam: 2.20 cm  Dalton McleanMD Electronically signed by Wilfred Lacy Signature Date/Time: 04/11/2022/1:37:52 PM    Final             Assessment & Plan    1.  Bilateral lower extremity edema: -Patient seen recently by PCP and noted to have 3+ pitting lower extremity edema.  He was recently admitted to the hospital similar edema which improved with optimization of levothyroxine. -Today patient reports ongoing lower extremity edema and is 2+ on examination. -He has been tolerating Lasix 40 mg by his PCP. -We will have him increase Lasix to 40 mg twice daily x 2 days and then return to 40 mg daily. -Continue potassium 20 mEq and make sure to weigh daily. -Due to hyponatremia he was encouraged to add a salty snack to his current daily regimen.  2.  Severe aortic stenosis: -Most recent 2D echo showed mean gradient of 42 mmHg with preserved ejection fraction  -Today patient reports shortness of breath with heavy exertion but denies any chest pain or syncope. -He has 2+ pitting edema as noted above but otherwise denies any new symptoms or complaints.  3.  Essential hypertension: -Patient's blood pressure today was well-controlled at 112/50 -Continue Cardizem 180 mg  4.  Permanent atrial fibrillation: -Today patient is rate controlled atrial fibrillation with a rate of 64 bpm -Continue Cardizem 180 mg daily -Continue Eliquis 5 mg twice daily -CHA2DS2-VASc score is a 4  Disposition: Follow-up with Orbie Pyo, MD or  APP as scheduled    Medication Adjustments/Labs and Tests Ordered: Current medicines are reviewed at length with the patient today.  Concerns regarding  medicines are outlined above.   Signed, Napoleon Form, Leodis Rains, NP 06/10/2022, 12:56 PM [facility]  Note:  This document was prepared using Dragon voice recognition software and may include unintentional dictation errors.

## 2022-06-15 DIAGNOSIS — I350 Nonrheumatic aortic (valve) stenosis: Secondary | ICD-10-CM | POA: Diagnosis not present

## 2022-06-15 DIAGNOSIS — R0602 Shortness of breath: Secondary | ICD-10-CM | POA: Diagnosis not present

## 2022-06-15 DIAGNOSIS — D649 Anemia, unspecified: Secondary | ICD-10-CM | POA: Diagnosis not present

## 2022-06-15 DIAGNOSIS — R946 Abnormal results of thyroid function studies: Secondary | ICD-10-CM | POA: Diagnosis not present

## 2022-06-15 DIAGNOSIS — E871 Hypo-osmolality and hyponatremia: Secondary | ICD-10-CM | POA: Diagnosis not present

## 2022-06-15 DIAGNOSIS — M3130 Wegener's granulomatosis without renal involvement: Secondary | ICD-10-CM | POA: Diagnosis not present

## 2022-06-15 DIAGNOSIS — Z7901 Long term (current) use of anticoagulants: Secondary | ICD-10-CM | POA: Diagnosis not present

## 2022-06-15 DIAGNOSIS — J849 Interstitial pulmonary disease, unspecified: Secondary | ICD-10-CM | POA: Diagnosis not present

## 2022-06-15 DIAGNOSIS — R600 Localized edema: Secondary | ICD-10-CM | POA: Diagnosis not present

## 2022-06-15 DIAGNOSIS — J4540 Moderate persistent asthma, uncomplicated: Secondary | ICD-10-CM | POA: Diagnosis not present

## 2022-06-15 DIAGNOSIS — I4892 Unspecified atrial flutter: Secondary | ICD-10-CM | POA: Diagnosis not present

## 2022-06-15 DIAGNOSIS — I700 Atherosclerosis of aorta: Secondary | ICD-10-CM | POA: Diagnosis not present

## 2022-06-20 ENCOUNTER — Encounter (HOSPITAL_COMMUNITY): Payer: Self-pay | Admitting: Physician Assistant

## 2022-06-20 ENCOUNTER — Ambulatory Visit (HOSPITAL_COMMUNITY)
Admission: RE | Admit: 2022-06-20 | Discharge: 2022-06-20 | Disposition: A | Discharge status: Home / Self Care | Payer: Medicare Other | Source: Ambulatory Visit | Attending: Physician Assistant | Admitting: Physician Assistant

## 2022-06-20 VITALS — BP 128/68 | HR 82 | Ht 69.0 in | Wt 146.8 lb

## 2022-06-20 DIAGNOSIS — I350 Nonrheumatic aortic (valve) stenosis: Secondary | ICD-10-CM | POA: Diagnosis not present

## 2022-06-20 DIAGNOSIS — I4892 Unspecified atrial flutter: Secondary | ICD-10-CM | POA: Diagnosis not present

## 2022-06-20 DIAGNOSIS — D6869 Other thrombophilia: Secondary | ICD-10-CM

## 2022-06-20 DIAGNOSIS — Z7901 Long term (current) use of anticoagulants: Secondary | ICD-10-CM | POA: Diagnosis not present

## 2022-06-20 DIAGNOSIS — Z79899 Other long term (current) drug therapy: Secondary | ICD-10-CM | POA: Diagnosis not present

## 2022-06-20 DIAGNOSIS — I4821 Permanent atrial fibrillation: Secondary | ICD-10-CM | POA: Diagnosis not present

## 2022-06-20 NOTE — Progress Notes (Signed)
Primary Care Physician: Tisovec, Adelfa Koh, MD Referring Physician: Dr. Johney Frame Primary Cardiologist: Dr Lynnette Caffey   Chad Morrison is a 89 y.o. male with a h/o typical atrial flutter, on amiodarone for evaluation in the afib clinic. He saw Dr. Johney Frame in August and was c/o of shortness of breath. There was concern of amiodarone toxicity and pt asked to reduce amio to 100 mg a day. He had refused typical atrial flutter ablation in the past. He saw pulmonology and found to be in rapid atrial fibrillation. Dr. Johney Frame  was notified  and he was asked to have f/u here. He increased his amio back to 200 mg. Pulmonology is planning to repeat PFT's when his heart rate is controlled. 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 Pulmonology  questioned if lungs were also involved contributing to increased shortness of breath. He saw Dr. Johney Frame 10/10 Amiodarone was stopped and Multaq was started. He is on Eliquis for a CHADS2VASC score of 3. Multaq was discontinued 2/2 persistent atrial fibrillation. Deemed to be in permanent atrial fibrillation.   On follow up today, patient remains in permanent, rate controlled afib. No bleeding issues on anticoagulation. Over the past couple months, he has been having increased lower extremity edema and SOB with exertion. He was started on Lasix and compression stockings which has helped. Echo showed severe AS and he has follow up with Dr Trula Ore office.   Today, he denies symptoms of palpitations, chest pain, orthopnea, PND, dizziness, presyncope, syncope, or neurologic sequela. The patient is tolerating medications without difficulties and is otherwise without complaint today.  Past Medical History:  Diagnosis Date   Anxiety    Asthma    Asthmatic bronchitis    Atrial flutter    chads2vasc score is 3   Ehrlichiosis 2008   GERD (gastroesophageal reflux disease)    Granulomatosis with polyangiitis    HTN (hypertension)    Hyperlipidemia     Nuclear sclerotic cataract of left eye 06/04/2019   Cataract surgery completed March 2023, Dr. Vonna Kotyk   Paroxysmal atrial fibrillation    Prostate cancer    Seasonal allergies    Past Surgical History:  Procedure Laterality Date   BASAL CELL CARCINOMA EXCISION  2013   EYELID   HERNIA REPAIR Bilateral 2000, 2002   KNEE ARTHROSCOPY  2000   prostectomy  1995   RADICAL   TONSILLECTOMY  1940    Current Outpatient Medications  Medication Sig Dispense Refill   albuterol (VENTOLIN HFA) 108 (90 Base) MCG/ACT inhaler USE 2 INHALATIONS ORALLY   EVERY 4 HOURS AS NEEDED FORWHEEZING OR SHORTNESS OF   BREATH 18 g 1   ALPRAZolam (XANAX) 0.25 MG tablet Take 0.125 mg by mouth at bedtime.     apixaban (ELIQUIS) 5 MG TABS tablet Take 1 tablet (5 mg total) by mouth 2 (two) times daily. 180 tablet 2   Ascorbic Acid (VITAMIN C PO) Take 1 tablet by mouth every morning. Not sure the dosage     azelastine (ASTELIN) 0.1 % nasal spray Place 1 spray into both nostrils daily as needed for rhinitis or allergies.     Benralizumab (FASENRA [place]) Inject into the skin every 8 (eight) weeks.     Calcium Carbonate-Vitamin D (CALCIUM-VITAMIN D) 600-125 MG-UNIT TABS Take 1 tablet by mouth 2 (two) times daily.      diltiazem (CARDIZEM CD) 180 MG 24 hr capsule TAKE 1 CAPSULE EVERY       MORNING AND AT BEDTIME 180 capsule  3   EPINEPHrine 0.3 mg/0.3 mL IJ SOAJ injection SMARTSIG:0.3 Milligram(s) IM Once 1 each 1   esomeprazole (NEXIUM) 40 MG capsule Take 1 capsule (40 mg total) by mouth 2 (two) times daily. 180 capsule 5   Ferrous Sulfate (SLOW FE PO) Take 1 tablet by mouth every morning.     furosemide (LASIX) 40 MG tablet Take 40 mg by mouth daily.     levocetirizine (XYZAL) 5 MG tablet TAKE 1 TABLET EVERY EVENING 30 tablet 0   levothyroxine (SYNTHROID) 100 MCG tablet Take 1 tablet (100 mcg total) by mouth daily at 6 (six) AM. 30 tablet 0   losartan (COZAAR) 50 MG tablet Take 50 mg by mouth at bedtime.     montelukast  (SINGULAIR) 10 MG tablet Take 1 tablet (10 mg total) by mouth at bedtime. 90 tablet 3   Multiple Vitamin (MULTIVITAMIN WITH MINERALS) TABS tablet Take 1 tablet daily by mouth.     Multiple Vitamins-Minerals (OCUVITE PRESERVISION PO) Take 1 tablet 2 (two) times daily by mouth.     Polyethyl Glycol-Propyl Glycol (SYSTANE OP) Place 1 drop into both eyes daily as needed (Dry eye).     rosuvastatin (CRESTOR) 5 MG tablet TAKE 1 TABLET DAILY 90 tablet 3   SYMBICORT 160-4.5 MCG/ACT inhaler Inhale 2 puffs into the lungs in the morning and at bedtime. 30.8 g 1   Current Facility-Administered Medications  Medication Dose Route Frequency Provider Last Rate Last Admin   Benralizumab SOSY 30 mg  30 mg Subcutaneous Q8 Weeks Kozlow, Alvira Philips, MD   30 mg at 06/07/22 1400    Allergies  Allergen Reactions   Other Other (See Comments)    Latex Gloves   Penicillins Hives    Has patient had a PCN reaction causing immediate rash, facial/tongue/throat swelling, SOB or lightheadedness with hypotension: No Has patient had a PCN reaction causing severe rash involving mucus membranes or skin necrosis: No Has patient had a PCN reaction that required hospitalization: No Has patient had a PCN reaction occurring within the last 10 years: Yes If all of the above answers are "NO", then may proceed with Cephalosporin use.    Sulfa Antibiotics Other (See Comments)    Unknown to patient    Sulfamethoxazole-Trimethoprim Other (See Comments)   Latex Hives and Rash    Social History   Socioeconomic History   Marital status: Married    Spouse name: [name]   Number of children: 2   Years of education: Not on file   Highest education level: Not on file  Occupational History   Occupation: RETIRED  Tobacco Use   Smoking status: Former    Packs/day: 0.50    Years: 4.00    Additional pack years: 0.00    Total pack years: 2.00    Types: Cigarettes    Quit date: 02/28/1961    Years since quitting: 61.3   Smokeless  tobacco: Never   Tobacco comments:    Former smoker 06/20/22  Vaping Use   Vaping Use: Never used  Substance and Sexual Activity   Alcohol use: Yes    Alcohol/week: 2.0 - 3.0 standard drinks of alcohol    Types: 2 - 3 Standard drinks or equivalent per week   Drug use: No   Sexual activity: Not on file  Other Topics Concern   Not on file  Social History Narrative   Pt recently moved to [place] from [place] to be near his son.   Retired.   Social Determinants of  Health   Financial Resource Strain: Not on file  Food Insecurity: No Food Insecurity (05/31/2022)   Hunger Vital Sign    Worried About Running Out of Food in the Last Year: Never true    Ran Out of Food in the Last Year: Never true  Transportation Needs: No Transportation Needs (05/31/2022)   PRAPARE - Administrator, Civil Service (Medical): No    Lack of Transportation (Non-Medical): No  Physical Activity: Not on file  Stress: Not on file  Social Connections: Not on file  Intimate Partner Violence: Not At Risk (05/31/2022)   Humiliation, Afraid, Rape, and Kick questionnaire    Fear of Current or Ex-Partner: No    Emotionally Abused: No    Physically Abused: No    Sexually Abused: No    Family History  Problem Relation Age of Onset   Asthma Mother 38   Allergies Mother    Ulcers Mother    CVA Father 68   Heart Problems Father    Other Father        NEUROLOGIC ISSUES    Other Brother 67       BICYCLE ACCIDENT   Migraines Son    Diabetes Mellitus I Son     ROS- All systems are reviewed and negative except as per the HPI above  Physical Exam: Vitals:   06/20/22 1014  BP: 128/68  Pulse: 82  Weight: 66.6 kg  Height: 5\' 9"  (1.753 m)   Wt Readings from Last 3 Encounters:  06/20/22 66.6 kg  06/10/22 70.1 kg  05/30/22 68.9 kg    Labs: Lab Results  Component Value Date   NA 130 (L) 06/01/2022   K 3.9 06/01/2022   CL 97 (L) 06/01/2022   CO2 24 06/01/2022   GLUCOSE 95 06/01/2022   BUN  10 06/01/2022   CREATININE 0.91 06/01/2022   CALCIUM 8.6 (L) 06/01/2022   PHOS 3.2 05/30/2022   MG 2.0 05/31/2022   Lab Results  Component Value Date   INR 1.4 (H) 04/12/2022   No results found for: "CHOL", "HDL", "LDLCALC", "TRIG"   GEN- The patient is a well appearing elderly male, alert and oriented x 3 today.   HEENT-head normocephalic, atraumatic, sclera clear, conjunctiva pink, hearing intact, trachea midline. Lungs- Clear to ausculation bilaterally, normal work of breathing Heart- irregular rate and rhythm, no rubs or gallops, 3/6 systolic murmur  GI- soft, NT, ND, + BS Extremities- no clubbing, cyanosis, or edema MS- no significant deformity or atrophy Skin- no rash or lesion Psych- euthymic mood, full affect Neuro- strength and sensation are intact   ECG today demonstrates Afib, RBBB Vent. rate 82 BPM PR interval * ms QRS duration 148 ms QT/QTcB 392/457 ms  Epic records reviewed  Echo 04/11/22  1. Left ventricular ejection fraction, by estimation, is 55 to 60%. The  left ventricle has normal function. The left ventricle has no regional  wall motion abnormalities. There is mild concentric left ventricular  hypertrophy. Left ventricular diastolic parameters are indeterminate.   2. Right ventricular systolic function is normal. The right ventricular  size is normal. Tricuspid regurgitation signal is inadequate for assessing  PA pressure.   3. Left atrial size was moderately dilated.   4. Right atrial size was moderately dilated.   5. The mitral valve is normal in structure. Mild mitral valve  regurgitation. No evidence of mitral stenosis.   6. The aortic valve is tricuspid. There is severe calcifcation of the  aortic valve.  Aortic valve regurgitation is trivial. Severe aortic valve  stenosis. Aortic valve area, by VTI measures 0.68 cm. Aortic valve mean  gradient measures 42.5 mmHg.   7. Aortic dilatation noted. There is mild dilatation of the ascending   aorta, measuring 39 mm.   8. The inferior vena cava is normal in size with greater than 50%  respiratory variability, suggesting right atrial pressure of 3 mmHg.   9. The patient was in atrial fibrillation.     CHA2DS2-VASc Score = 4  The patient's score is based upon: CHF History: 0 HTN History: 1 Diabetes History: 0 Stroke History: 0 Vascular Disease History: 1 (seen on chest CT) Age Score: 2 Gender Score: 0       ASSESSMENT AND PLAN: 1. Permanent Atrial Fibrillation/atrial flutter The patient's CHA2DS2-VASc score is 4, indicating a 4.8% annual risk of stroke.   Patient in rate controlled afib, asymptomatic. Continue cardizem 180 mg BID  Continue Eliquis 5 mg BID  2. Secondary Hypercoagulable State (ICD10:  D68.69) The patient is at significant risk for stroke/thromboembolism based upon his CHA2DS2-VASc Score of 4.  Continue Apixaban (Eliquis).   3. Aortic Stenosis Severe Repeat echo scheduled 5/14 Followed by Dr Lynnette Caffey.    Follow up with Carlos Levering NP as scheduled.    Jorja Loa PA-C Afib Clinic [facility] [address] [place], [place] [address] [phone]

## 2022-06-21 ENCOUNTER — Other Ambulatory Visit (HOSPITAL_COMMUNITY): Payer: Self-pay

## 2022-06-22 ENCOUNTER — Ambulatory Visit (HOSPITAL_COMMUNITY)
Admission: RE | Admit: 2022-06-22 | Discharge: 2022-06-22 | Disposition: A | Discharge status: Home / Self Care | Payer: Medicare Other | Source: Ambulatory Visit | Attending: Internal Medicine | Admitting: Internal Medicine

## 2022-06-22 DIAGNOSIS — D649 Anemia, unspecified: Secondary | ICD-10-CM | POA: Insufficient documentation

## 2022-06-22 MED ORDER — SODIUM CHLORIDE 0.9 % IV SOLN
510.0000 mg | Freq: Once | INTRAVENOUS | Status: AC
  Administered 2022-06-22: 510 mg via INTRAVENOUS
  Filled 2022-06-22: qty 510

## 2022-06-23 DIAGNOSIS — Z961 Presence of intraocular lens: Secondary | ICD-10-CM | POA: Diagnosis not present

## 2022-06-23 DIAGNOSIS — H40013 Open angle with borderline findings, low risk, bilateral: Secondary | ICD-10-CM | POA: Diagnosis not present

## 2022-06-23 DIAGNOSIS — H18413 Arcus senilis, bilateral: Secondary | ICD-10-CM | POA: Diagnosis not present

## 2022-06-23 DIAGNOSIS — H35371 Puckering of macula, right eye: Secondary | ICD-10-CM | POA: Diagnosis not present

## 2022-06-24 DIAGNOSIS — Z7901 Long term (current) use of anticoagulants: Secondary | ICD-10-CM | POA: Diagnosis not present

## 2022-06-24 DIAGNOSIS — D649 Anemia, unspecified: Secondary | ICD-10-CM | POA: Diagnosis not present

## 2022-06-24 DIAGNOSIS — J4540 Moderate persistent asthma, uncomplicated: Secondary | ICD-10-CM | POA: Diagnosis not present

## 2022-06-24 DIAGNOSIS — R600 Localized edema: Secondary | ICD-10-CM | POA: Diagnosis not present

## 2022-06-24 DIAGNOSIS — J849 Interstitial pulmonary disease, unspecified: Secondary | ICD-10-CM | POA: Diagnosis not present

## 2022-06-24 DIAGNOSIS — R0602 Shortness of breath: Secondary | ICD-10-CM | POA: Diagnosis not present

## 2022-06-24 DIAGNOSIS — M3130 Wegener's granulomatosis without renal involvement: Secondary | ICD-10-CM | POA: Diagnosis not present

## 2022-06-24 DIAGNOSIS — I4892 Unspecified atrial flutter: Secondary | ICD-10-CM | POA: Diagnosis not present

## 2022-06-24 DIAGNOSIS — I350 Nonrheumatic aortic (valve) stenosis: Secondary | ICD-10-CM | POA: Diagnosis not present

## 2022-06-24 DIAGNOSIS — I700 Atherosclerosis of aorta: Secondary | ICD-10-CM | POA: Diagnosis not present

## 2022-06-24 DIAGNOSIS — R946 Abnormal results of thyroid function studies: Secondary | ICD-10-CM | POA: Diagnosis not present

## 2022-06-24 DIAGNOSIS — E871 Hypo-osmolality and hyponatremia: Secondary | ICD-10-CM | POA: Diagnosis not present

## 2022-06-28 ENCOUNTER — Ambulatory Visit (INDEPENDENT_AMBULATORY_CARE_PROVIDER_SITE_OTHER): Payer: Medicare Other | Admitting: Allergy and Immunology

## 2022-06-28 VITALS — BP 112/58 | HR 76 | Temp 97.6°F | Resp 18 | Ht 69.0 in | Wt 146.7 lb

## 2022-06-28 DIAGNOSIS — J3489 Other specified disorders of nose and nasal sinuses: Secondary | ICD-10-CM

## 2022-06-28 DIAGNOSIS — J4550 Severe persistent asthma, uncomplicated: Secondary | ICD-10-CM | POA: Diagnosis not present

## 2022-06-28 DIAGNOSIS — D472 Monoclonal gammopathy: Secondary | ICD-10-CM | POA: Diagnosis not present

## 2022-06-28 DIAGNOSIS — J3089 Other allergic rhinitis: Secondary | ICD-10-CM | POA: Diagnosis not present

## 2022-06-28 DIAGNOSIS — M3130 Wegener's granulomatosis without renal involvement: Secondary | ICD-10-CM | POA: Diagnosis not present

## 2022-06-28 DIAGNOSIS — K219 Gastro-esophageal reflux disease without esophagitis: Secondary | ICD-10-CM | POA: Diagnosis not present

## 2022-06-28 MED ORDER — SYMBICORT 160-4.5 MCG/ACT IN AERO: 2.0000 | INHALATION_SPRAY | Freq: Two times a day (BID) | RESPIRATORY_TRACT | 1 refills | Status: DC

## 2022-06-28 MED ORDER — ESOMEPRAZOLE MAGNESIUM 40 MG PO CPDR: 40.0000 mg | DELAYED_RELEASE_CAPSULE | Freq: Two times a day (BID) | ORAL | 1 refills | Status: DC

## 2022-06-28 MED ORDER — ALBUTEROL SULFATE HFA 108 (90 BASE) MCG/ACT IN AERS: 2.0000 | INHALATION_SPRAY | RESPIRATORY_TRACT | 1 refills | Status: DC | PRN

## 2022-06-28 MED ORDER — AZELASTINE HCL 0.1 % NA SOLN: 2.0000 | Freq: Two times a day (BID) | NASAL | 1 refills | Status: DC

## 2022-06-28 MED ORDER — MONTELUKAST SODIUM 10 MG PO TABS: 10.0000 mg | ORAL_TABLET | Freq: Every evening | ORAL | 1 refills | Status: DC

## 2022-06-28 NOTE — Patient Instructions (Signed)
  1.  Continue to treat and prevent inflammation:   A. Symbicort 160 - 2 inhalations 1-2 times per day    B. Montelukast 10 mg - 1 tablet 1 time per day  C. Benralizumab injections    2.  Continue to treat and prevent reflux:   A. Minimize caffeine consumption   B. Nexium 40 mg - 1-2 times per day  3. If needed:   A. albuterol HFA - 2 inhalations every 4-6 hours  B.  Azelastine-1-2 sprays each nostril 1-2 times per day  4.  Return to clinic in 6 months or earlier if problem  5. Check IgA/G/M, SPEP w/ immunofixation

## 2022-06-28 NOTE — Progress Notes (Signed)
[place] [facility] [place] [facility] [place] [facility] [place] [facility] [place]   Follow-up Note  Referring Provider: Tisovec, Adelfa Koh, MD Primary Provider: Gaspar Garbe, MD Date of Office Visit: 06/28/2022  Subjective:   Chad Morrison (DOB: May 08, 1932) is a 89 y.o. male who [facility] in re-evaluation of the following:  HPI: Gene returns to this clinic in evaluation of asthma, allergic rhinitis, granulomatosis with polyangiitis, septal perforation, LPR, history of monoclonal gammopathy.  I last saw him in this clinic 21 December 2021.  He had very little issues with his respiratory tract.  He has not required a systemic steroid or antibiotic for any type of airway issue.  He continues to use his benralizumab as well as Symbicort and montelukast which is resulting in very good control of his airway issue.  He rarely uses a short acting bronchodilator.  He had no problems with his upper airways.  He has not had any problems with his reflux while using his proton pump inhibitor.  He did have an eventful spring with pneumonia February 2024 and then had hyponatremia and anemia and has had progression of his aortic stenosis.  He is required a iron infusion last week and he has an echocardiogram scheduled for next week to see if he would be a candidate for placement of a intravascular aortic valve.  Allergies as of 06/28/2022       Reactions   Penicillins Hives   Has patient had a PCN reaction causing immediate rash, facial/tongue/throat swelling, SOB or lightheadedness with hypotension: No Has patient had a PCN reaction causing severe rash involving mucus membranes or skin necrosis: No Has patient had a PCN reaction that required hospitalization: No Has patient had a PCN reaction occurring within the last 10 years: Yes If all of the above answers are "NO", then may proceed with Cephalosporin use.   Latex Hives, Rash   Other Rash   Sulfa Antibiotics Nausea  Only   Unknown to patient   Sulfamethoxazole-trimethoprim Nausea Only        Medication List    albuterol 108 (90 Base) MCG/ACT inhaler Commonly known as: VENTOLIN HFA USE 2 INHALATIONS ORALLY   EVERY 4 HOURS AS NEEDED FORWHEEZING OR SHORTNESS OF   BREATH   ALPRAZolam 0.25 MG tablet Commonly known as: XANAX Take 0.125 mg by mouth at bedtime.   apixaban 5 MG Tabs tablet Commonly known as: Eliquis Take 1 tablet (5 mg total) by mouth 2 (two) times daily.   azelastine 0.1 % nasal spray Commonly known as: ASTELIN Place 1 spray into both nostrils daily as needed for rhinitis or allergies.   Calcium-Vitamin D 600-125 MG-UNIT Tabs Take 1 tablet by mouth 2 (two) times daily.   diltiazem 180 MG 24 hr capsule Commonly known as: CARDIZEM CD TAKE 1 CAPSULE EVERY       MORNING AND AT BEDTIME   EPINEPHrine 0.3 mg/0.3 mL Soaj injection Commonly known as: EPI-PEN SMARTSIG:0.3 Milligram(s) IM Once   esomeprazole 40 MG capsule Commonly known as: NEXIUM Take 1 capsule (40 mg total) by mouth 2 (two) times daily.   FASENRA [place] Inject into the skin every 8 (eight) weeks.   furosemide 40 MG tablet Commonly known as: LASIX Take 40 mg by mouth daily.   levocetirizine 5 MG tablet Commonly known as: XYZAL TAKE 1 TABLET EVERY EVENING   levothyroxine 100 MCG tablet Commonly known as: SYNTHROID Take 1 tablet (100 mcg total) by mouth daily at 6 (  six) AM.   montelukast 10 MG tablet Commonly known as: SINGULAIR Take 1 tablet (10 mg total) by mouth at bedtime.   multivitamin with minerals Tabs tablet Take 1 tablet daily by mouth.   OCUVITE PRESERVISION PO Take 1 tablet 2 (two) times daily by mouth.   Potassium Bicarb-Citric Acid 20 MEQ Tbef Take 1 tablet by mouth daily.   rosuvastatin 5 MG tablet Commonly known as: CRESTOR TAKE 1 TABLET DAILY   SLOW FE PO Take 1 tablet by mouth every morning.   Symbicort 160-4.5 MCG/ACT inhaler Generic drug: budesonide-formoterol Inhale 2  puffs into the lungs in the morning and at bedtime.   SYSTANE OP Place 1 drop into both eyes daily as needed (Dry eye).   VITAMIN C PO Take 1 tablet by mouth every morning. Not sure the dosage        Past Medical History:  Diagnosis Date  . Anxiety   . Asthma   . Asthmatic bronchitis   . Atrial flutter (HCC)    chads2vasc score is 3  . Ehrlichiosis 2008  . GERD (gastroesophageal reflux disease)   . Granulomatosis with polyangiitis (HCC)   . HTN (hypertension)   . Hyperlipidemia   . Nuclear sclerotic cataract of left eye 06/04/2019   Cataract surgery completed March 2023, Dr. Vonna Kotyk  . Paroxysmal atrial fibrillation (HCC)   . Prostate cancer (HCC)   . Seasonal allergies     Past Surgical History:  Procedure Laterality Date  . BASAL CELL CARCINOMA EXCISION  2013   EYELID  . HERNIA REPAIR Bilateral 2000, 2002  . KNEE ARTHROSCOPY  2000  . prostectomy  1995   RADICAL  . TONSILLECTOMY  1940    Review of systems negative except as noted in HPI / PMHx or noted below:  ROS   Objective:   Vitals:   06/28/22 1119  BP: (!) 112/58  Pulse: 76  Resp: 18  Temp: 97.6 F (36.4 C)  SpO2: 97%   Height: 5\' 9"  (175.3 cm)  Weight: 146 lb 11.2 oz (66.5 kg)   Physical Exam HENT:     Nose: Mucosal edema (Septal perforation) present.  Cardiovascular:     Heart sounds: Murmur (Prolonged systolic) heard.    Diagnostics:    Spirometry was performed and demonstrated an FEV1 of 2.39 at 93 % of predicted.  Assessment and Plan:   No diagnosis found.  Patient Instructions   1.  Continue to treat and prevent inflammation:   A. Symbicort 160 - 2 inhalations 1-2 times per day    B. Montelukast 10 mg - 1 tablet 1 time per day  C. Benralizumab injections    2.  Continue to treat and prevent reflux:   A. Minimize caffeine consumption   B. Nexium 40 mg - 1-2 times per day  3. If needed:   A. albuterol HFA - 2 inhalations every 4-6 hours  B.  Azelastine-1-2 sprays each  nostril 1-2 times per day  4.  Return to clinic in 6 months or earlier if problem    Laurette Schimke, MD Allergy / Immunology [facility]

## 2022-06-29 ENCOUNTER — Encounter: Payer: Self-pay | Admitting: Allergy and Immunology

## 2022-06-29 ENCOUNTER — Telehealth: Payer: Self-pay

## 2022-06-29 DIAGNOSIS — E039 Hypothyroidism, unspecified: Secondary | ICD-10-CM | POA: Diagnosis not present

## 2022-06-29 NOTE — Telephone Encounter (Signed)
Called patient - DOB verified - stated [facility] is monitoring and following him  - no need to repeat tests.  Patient advised message would be forwarded to provider .  Patient verbalized understanding, no further questions.

## 2022-06-29 NOTE — Telephone Encounter (Signed)
-----   Message from Jessica Priest, MD sent at 06/29/2022  7:03 AM EDT ----- Please inform Chad Morrison that I reviewed his previous blood tests. We need to repeat a IgA/G/M and a SPEP w/ immunofixation for his monoclonal gammopathy.

## 2022-06-30 DIAGNOSIS — H4040X0 Glaucoma secondary to eye inflammation, unspecified eye, stage unspecified: Secondary | ICD-10-CM | POA: Diagnosis not present

## 2022-06-30 DIAGNOSIS — H353212 Exudative age-related macular degeneration, right eye, with inactive choroidal neovascularization: Secondary | ICD-10-CM | POA: Diagnosis not present

## 2022-06-30 DIAGNOSIS — H353114 Nonexudative age-related macular degeneration, right eye, advanced atrophic with subfoveal involvement: Secondary | ICD-10-CM | POA: Diagnosis not present

## 2022-06-30 DIAGNOSIS — H35371 Puckering of macula, right eye: Secondary | ICD-10-CM | POA: Diagnosis not present

## 2022-06-30 DIAGNOSIS — H30041 Focal chorioretinal inflammation, macular or paramacular, right eye: Secondary | ICD-10-CM | POA: Diagnosis not present

## 2022-06-30 DIAGNOSIS — H35351 Cystoid macular degeneration, right eye: Secondary | ICD-10-CM | POA: Diagnosis not present

## 2022-06-30 DIAGNOSIS — H353122 Nonexudative age-related macular degeneration, left eye, intermediate dry stage: Secondary | ICD-10-CM | POA: Diagnosis not present

## 2022-07-01 ENCOUNTER — Other Ambulatory Visit: Payer: Self-pay | Admitting: *Deleted

## 2022-07-01 DIAGNOSIS — I350 Nonrheumatic aortic (valve) stenosis: Secondary | ICD-10-CM | POA: Diagnosis not present

## 2022-07-01 DIAGNOSIS — R918 Other nonspecific abnormal finding of lung field: Secondary | ICD-10-CM

## 2022-07-01 DIAGNOSIS — M3130 Wegener's granulomatosis without renal involvement: Secondary | ICD-10-CM | POA: Diagnosis not present

## 2022-07-01 DIAGNOSIS — J4540 Moderate persistent asthma, uncomplicated: Secondary | ICD-10-CM | POA: Diagnosis not present

## 2022-07-01 DIAGNOSIS — D649 Anemia, unspecified: Secondary | ICD-10-CM | POA: Diagnosis not present

## 2022-07-01 DIAGNOSIS — I4892 Unspecified atrial flutter: Secondary | ICD-10-CM | POA: Diagnosis not present

## 2022-07-01 DIAGNOSIS — E871 Hypo-osmolality and hyponatremia: Secondary | ICD-10-CM | POA: Diagnosis not present

## 2022-07-01 DIAGNOSIS — R600 Localized edema: Secondary | ICD-10-CM | POA: Diagnosis not present

## 2022-07-01 DIAGNOSIS — J849 Interstitial pulmonary disease, unspecified: Secondary | ICD-10-CM | POA: Diagnosis not present

## 2022-07-01 DIAGNOSIS — Z7901 Long term (current) use of anticoagulants: Secondary | ICD-10-CM | POA: Diagnosis not present

## 2022-07-05 DIAGNOSIS — E039 Hypothyroidism, unspecified: Secondary | ICD-10-CM | POA: Diagnosis not present

## 2022-07-12 ENCOUNTER — Ambulatory Visit (HOSPITAL_COMMUNITY): Payer: Medicare Other | Attending: Internal Medicine

## 2022-07-12 DIAGNOSIS — I4821 Permanent atrial fibrillation: Secondary | ICD-10-CM | POA: Diagnosis not present

## 2022-07-12 DIAGNOSIS — I4510 Unspecified right bundle-branch block: Secondary | ICD-10-CM | POA: Diagnosis not present

## 2022-07-12 DIAGNOSIS — I350 Nonrheumatic aortic (valve) stenosis: Secondary | ICD-10-CM | POA: Insufficient documentation

## 2022-07-12 DIAGNOSIS — I10 Essential (primary) hypertension: Secondary | ICD-10-CM | POA: Diagnosis not present

## 2022-07-12 DIAGNOSIS — M3130 Wegener's granulomatosis without renal involvement: Secondary | ICD-10-CM | POA: Insufficient documentation

## 2022-07-12 LAB — ECHOCARDIOGRAM COMPLETE
AR max vel: 0.7 cm2
AV Area VTI: 0.7 cm2
AV Area mean vel: 0.66 cm2
AV Mean grad: 40.2 mmHg
AV Peak grad: 66 mmHg
Ao pk vel: 4.06 m/s
Area-P 1/2: 3.59 cm2
P 1/2 time: 557 msec
S' Lateral: 2.9 cm

## 2022-07-14 DIAGNOSIS — H40013 Open angle with borderline findings, low risk, bilateral: Secondary | ICD-10-CM | POA: Diagnosis not present

## 2022-07-19 NOTE — Progress Notes (Unsigned)
Cardiology Clinic Note   Patient Name: Chad Morrison Date of Encounter: 07/21/2022  Primary Care Provider:  Gaspar Garbe, MD Primary Cardiologist:  Orbie Pyo, MD  Patient Profile    Chad Morrison 89 year old male presents to the clinic today for follow-up evaluation of his severe aortic stenosis.  Past Medical History    Past Medical History:  Diagnosis Date   Anxiety    Asthma    Asthmatic bronchitis    Atrial flutter (HCC)    chads2vasc score is 3   Ehrlichiosis 2008   GERD (gastroesophageal reflux disease)    Granulomatosis with polyangiitis (HCC)    HTN (hypertension)    Hyperlipidemia    Nuclear sclerotic cataract of left eye 06/04/2019   Cataract surgery completed March 2023, Dr. Vonna Kotyk   Paroxysmal atrial fibrillation [facility])    Prostate cancer (HCC)    Seasonal allergies    Past Surgical History:  Procedure Laterality Date   BASAL CELL CARCINOMA EXCISION  2013   EYELID   HERNIA REPAIR Bilateral 2000, 2002   KNEE ARTHROSCOPY  2000   prostectomy  1995   RADICAL   TONSILLECTOMY  1940    Allergies  Allergies  Allergen Reactions   Penicillins Hives    Has patient had a PCN reaction causing immediate rash, facial/tongue/throat swelling, SOB or lightheadedness with hypotension: No Has patient had a PCN reaction causing severe rash involving mucus membranes or skin necrosis: No Has patient had a PCN reaction that required hospitalization: No Has patient had a PCN reaction occurring within the last 10 years: Yes If all of the above answers are "NO", then may proceed with Cephalosporin use.    Latex Hives and Rash   Other Rash   Sulfa Antibiotics Nausea Only    Unknown to patient    Sulfamethoxazole-Trimethoprim Nausea Only    History of Present Illness    Chad Morrison has a PMH of atrial fibrillation on apixaban, RBBB, severe aortic stenosis, hyperlipidemia, hypertension, hypothyroidism, severe persistent asthma, and pulmonary nodules.  He was  followed by Dr. Johney Frame for management of his A-fib.  He establish care with Dr. Lynnette Caffey for management of his aortic stenosis.  His echocardiogram most recently showed severe aortic stenosis with heavily calcified aortic valve with a mean gradient of 33 mmHg.  His EF was noted to be 55-60%.  He followed up to 1524 to discuss progression of his aortic stenosis.  He was seen in the emergency department on 04/12/2022 for PNA and was treated with antibiotics.  He continued to be fairly active but endorsed shortness of breath with increased physical activity.  He denied chest pain presyncope and syncope.  He was noted to have hospital admission for bilateral lower extremity edema and hyponatremia.  He was found to have hypothyroidism and received IV levothyroxine.  His edema and hyponatremia improved quickly.  He followed up with his PCP 06/09/2021 and was noted to have 3+ pitting lower extremity edema.  His BNP was elevated at 399.  He did not note improvement with his edema with using lower extremity compression stockings or elevation.  He presented for follow-up with Chad Searing, NP-C on 06/10/2022.  During that time he was noted to have 2+ lower extremity edema.  He was tolerating his 40 mg of furosemide daily well.  His blood pressure was 112/50.  His heart rate was 64 bpm.  He did note some shortness of breath with increased physical activity.  He had been weighing regularly and  had lost 1-2 pounds.  He denies chest pain or palpitations.  His lungs were clear to auscultation.  CHA2DS2-VASc score 4.  He presents to the clinic today for follow-up evaluation and states he is improving since his hospitalization.  He has been following with his PCP.  He has been wearing lower extremity support stockings and he is now only requiring about 20 mg of furosemide daily his levothyroxine was also recently decreased to 88 mcg.  We reviewed his recent echocardiogram and he expressed understanding.  We reviewed options for  TAVR and signs and symptoms that his aortic valve is contributing to his symptoms.  We reviewed diastolic dysfunction.  He and his wife expressed understanding.  At this time I will continue his current medication regimen.  His blood pressure is well-controlled at 124/52.  He has been increasing his physical activity and feels significantly improved.  Will plan follow-up as scheduled.   Today he denies chest pain, shortness of breath, lower extremity edema, fatigue, palpitations, melena, hematuria, hemoptysis, diaphoresis, weakness, presyncope, syncope, orthopnea, and PND.    Home Medications    Prior to Admission medications   Medication Sig Start Date End Date Taking? Authorizing Provider  albuterol (VENTOLIN HFA) 108 (90 Base) MCG/ACT inhaler Inhale 2 puffs into the lungs every 4 (four) hours as needed for wheezing or shortness of breath. 06/28/22   Kozlow, Alvira Philips, MD  ALPRAZolam Prudy Feeler) 0.25 MG tablet Take 0.125 mg by mouth at bedtime.    [provider]  apixaban (ELIQUIS) 5 MG TABS tablet Take 1 tablet (5 mg total) by mouth 2 (two) times daily. 04/27/22   Fenton, Clint R, PA  Ascorbic Acid (VITAMIN C PO) Take 1 tablet by mouth every morning. Not sure the dosage    [provider]  azelastine (ASTELIN) 0.1 % nasal spray Place 2 sprays into both nostrils 2 (two) times daily. 06/28/22   Kozlow, Alvira Philips, MD  Benralizumab [facility]) Inject into the skin every 8 (eight) weeks.    [provider]  Calcium Carbonate-Vitamin D (CALCIUM-VITAMIN D) 600-125 MG-UNIT TABS Take 1 tablet by mouth 2 (two) times daily.     [provider]  diltiazem (CARDIZEM CD) 180 MG 24 hr capsule TAKE 1 CAPSULE EVERY       MORNING AND AT BEDTIME 12/06/21   Fenton, Clint R, PA  EPINEPHrine 0.3 mg/0.3 mL IJ SOAJ injection SMARTSIG:0.3 Milligram(s) IM Once 06/22/21   Kozlow, Alvira Philips, MD  esomeprazole (NEXIUM) 40 MG capsule Take 1 capsule (40 mg total) by mouth 2 (two) times daily. 06/28/22    Kozlow, Alvira Philips, MD  Ferrous Sulfate (SLOW FE PO) Take 1 tablet by mouth every morning.    [provider]  furosemide (LASIX) 40 MG tablet Take 40 mg by mouth daily.    [provider]  levocetirizine (XYZAL) 5 MG tablet TAKE 1 TABLET EVERY EVENING 05/16/22   Kozlow, Alvira Philips, MD  levothyroxine (SYNTHROID) 100 MCG tablet Take 1 tablet (100 mcg total) by mouth daily at 6 (six) AM. 06/02/22 07/02/22  Azucena Fallen, MD  montelukast (SINGULAIR) 10 MG tablet Take 1 tablet (10 mg total) by mouth at bedtime. 06/28/22   Kozlow, Alvira Philips, MD  Multiple Vitamin (MULTIVITAMIN WITH MINERALS) TABS tablet Take 1 tablet daily by mouth.    [provider]  Multiple Vitamins-Minerals (OCUVITE PRESERVISION PO) Take 1 tablet 2 (two) times daily by mouth.    [provider]  Polyethyl Glycol-Propyl Glycol (SYSTANE OP)  Place 1 drop into both eyes daily as needed (Dry eye).    [provider]  Potassium Bicarb-Citric Acid 20 MEQ TBEF Take 1 tablet by mouth daily. 06/08/22   [provider]  rosuvastatin (CRESTOR) 5 MG tablet TAKE 1 TABLET DAILY 12/06/21   Orbie Pyo, MD  SYMBICORT 160-4.5 MCG/ACT inhaler Inhale 2 puffs into the lungs in the morning and at bedtime. 06/28/22   Kozlow, Alvira Philips, MD    Family History    Family History  Problem Relation Age of Onset   Asthma Mother 67   Allergies Mother    Ulcers Mother    CVA Father 7   Heart Problems Father    Other Father        NEUROLOGIC ISSUES    Other Brother 57       BICYCLE ACCIDENT   Migraines Son    Diabetes Mellitus I Son    He indicated that his mother is deceased. He indicated that his father is deceased. He indicated that his brother is deceased. He indicated that his maternal grandmother is deceased. He indicated that his maternal grandfather is deceased. He indicated that his paternal grandmother is deceased. He indicated that his paternal grandfather is deceased. He indicated that both of his  sons are alive.  Social History    Social History   Socioeconomic History   Marital status: Married    Spouse name: [name]   Number of children: 2   Years of education: Not on file   Highest education level: Not on file  Occupational History   Occupation: RETIRED  Tobacco Use   Smoking status: Former    Packs/day: 0.50    Years: 4.00    Additional pack years: 0.00    Total pack years: 2.00    Types: Cigarettes    Quit date: 02/28/1961    Years since quitting: 61.4   Smokeless tobacco: Never   Tobacco comments:    Former smoker 06/20/22  Vaping Use   Vaping Use: Never used  Substance and Sexual Activity   Alcohol use: Yes    Alcohol/week: 2.0 - 3.0 standard drinks of alcohol    Types: 2 - 3 Standard drinks or equivalent per week   Drug use: No   Sexual activity: Not on file  Other Topics Concern   Not on file  Social History Narrative   Pt recently moved to [place] from [place] to be near his son.   Retired.   Social Determinants of Health   Financial Resource Strain: Not on file  Food Insecurity: No Food Insecurity (05/31/2022)   Hunger Vital Sign    Worried About Running Out of Food in the Last Year: Never true    Ran Out of Food in the Last Year: Never true  Transportation Needs: No Transportation Needs (05/31/2022)   PRAPARE - Administrator, Civil Service (Medical): No    Lack of Transportation (Non-Medical): No  Physical Activity: Not on file  Stress: Not on file  Social Connections: Not on file  Intimate Partner Violence: Not At Risk (05/31/2022)   Humiliation, Afraid, Rape, and Kick questionnaire    Fear of Current or Ex-Partner: No    Emotionally Abused: No    Physically Abused: No    Sexually Abused: No     Review of Systems    General:  No chills, fever, night sweats or weight changes.  Cardiovascular:  No chest pain, dyspnea on exertion, edema, orthopnea, palpitations, paroxysmal  nocturnal dyspnea. Dermatological: No rash,  lesions/masses Respiratory: No cough, dyspnea Urologic: No hematuria, dysuria Abdominal:   No nausea, vomiting, diarrhea, bright red blood per rectum, melena, or hematemesis Neurologic:  No visual changes, wkns, changes in mental status. All other systems reviewed and are otherwise negative except as noted above.  Physical Exam    VS:  BP (!) 124/52 (BP Location: Left Arm, Patient Position: Sitting, Cuff Size: Normal)   Pulse 78   Ht 5\' 9"  (1.753 m)   Wt 145 lb (65.8 kg)   BMI 21.41 kg/m  , BMI Body mass index is 21.41 kg/m. GEN: Well nourished, well developed, in no acute distress. HEENT: normal. Neck: Supple, no JVD, carotid bruits, or masses. Cardiac: RRR, no murmurs, rubs, or gallops. No clubbing, cyanosis, edema.  Radials/DP/PT 2+ and equal bilaterally.  Respiratory:  Respirations regular and unlabored, clear to auscultation bilaterally. GI: Soft, nontender, nondistended, BS + x 4. MS: no deformity or atrophy. Skin: warm and dry, no rash. Neuro:  Strength and sensation are intact. Psych: Normal affect.  Accessory Clinical Findings    Recent Labs: 05/30/2022: ALT 16 05/31/2022: Hemoglobin 9.9; Magnesium 2.0; Platelets 210; TSH 5.815 06/01/2022: BUN 10; Creatinine, Ser 0.91; Potassium 3.9; Sodium 130   Recent Lipid Panel No results found for: "CHOL", "TRIG", "HDL", "CHOLHDL", "VLDL", "LDLCALC", "LDLDIRECT"       ECG personally reviewed by me today-none today.  Echocardiogram 07/12/2022   IMPRESSIONS     1. Left ventricular ejection fraction, by estimation, is 60 to 65%. The  left ventricle has normal function. The left ventricle has no regional  wall motion abnormalities. There is severe left ventricular hypertrophy.  Left ventricular diastolic parameters   are indeterminate.   2. Right ventricular systolic function is normal. The right ventricular  size is normal.   3. Left atrial size was severely dilated.   4. Possible small PFO.   5. Right atrial size was  severely dilated.   6. The mitral valve is abnormal. Mild mitral valve regurgitation. No  evidence of mitral stenosis.   7. Tricuspid valve regurgitation is mild to moderate.   8. Gradients, AVA and DVI similar to TTE done 04/11/22. The aortic valve  is tricuspid. Aortic valve regurgitation is mild. Severe aortic valve  stenosis.   9. The inferior vena cava is normal in size with greater than 50%  respiratory variability, suggesting right atrial pressure of 3 mmHg.   FINDINGS   Left Ventricle: Left ventricular ejection fraction, by estimation, is 60  to 65%. The left ventricle has normal function. The left ventricle has no  regional wall motion abnormalities. The left ventricular internal cavity  size was normal in size. There is   severe left ventricular hypertrophy. Left ventricular diastolic  parameters are indeterminate.   Right Ventricle: The right ventricular size is normal. No increase in  right ventricular wall thickness. Right ventricular systolic function is  normal.   Left Atrium: Left atrial size was severely dilated.   Right Atrium: Right atrial size was severely dilated.   Pericardium: There is no evidence of pericardial effusion.   Mitral Valve: The mitral valve is abnormal. There is mild thickening of  the mitral valve leaflet(s). There is mild calcification of the mitral  valve leaflet(s). Mild mitral annular calcification. Mild mitral valve  regurgitation. No evidence of mitral  valve stenosis.   Tricuspid Valve: The tricuspid valve is normal in structure. Tricuspid  valve regurgitation is mild to moderate. No evidence of tricuspid  stenosis.   Aortic Valve: Gradients, AVA and DVI similar to TTE done 04/11/22. The  aortic valve is tricuspid. Aortic valve regurgitation is mild. Aortic  regurgitation PHT measures 557 msec. Severe aortic stenosis is present.  Aortic valve mean gradient measures 40.2  mmHg. Aortic valve peak gradient measures 66.0 mmHg. Aortic  valve area, by  VTI measures 0.70 cm.   Pulmonic Valve: The pulmonic valve was normal in structure. Pulmonic valve  regurgitation is not visualized. No evidence of pulmonic stenosis.   Aorta: The aortic root is normal in size and structure.   Venous: The inferior vena cava is normal in size with greater than 50%  respiratory variability, suggesting right atrial pressure of 3 mmHg.   IAS/Shunts: The interatrial septum was not well visualized.     Assessment & Plan   1.  Bilateral lower extremity edema-euvolemic.  Weight 145 pounds.  Continues to tolerate furosemide well. Continue furosemide, potassium Repeat BMP  Essential hypertension-BP today 124/52. Heart healthy diet Maintain blood pressure log Continue current medical therapy  Atrial fibrillation-heart rate today 68.  Reports compliance with apixaban.  Denies bleeding issues. Continue Cardizem, apixaban Avoid triggers caffeine, chocolate, EtOH, dehydration etc.  Aortic stenosis-no increased DOE or activity intolerance.   Reports increased physical activity tolerance and stable breathing.  Most recent echocardiogram showed mean gradient of 42 mmHg with normal LVEF. Continue to monitor  Disposition: Follow-up with Dr.Thukkani or APP in 3-4 months.   Thomasene Ripple. Isella Slatten NP-C     07/21/2022, 12:02 PM [facility] [address] Office [phone] Fax [phone]    I spent14 minutes examining this patient, reviewing medications, and using patient centered shared decision making involving her cardiac care.  Prior to her visit I spent greater than 20 minutes reviewing her past medical history,  medications, and prior cardiac tests.

## 2022-07-21 ENCOUNTER — Ambulatory Visit: Payer: Medicare Other | Admitting: Student

## 2022-07-21 ENCOUNTER — Encounter: Payer: Self-pay | Admitting: General Practice

## 2022-07-21 ENCOUNTER — Ambulatory Visit: Payer: Medicare Other | Attending: Internal Medicine | Admitting: General Practice

## 2022-07-21 VITALS — BP 124/52 | HR 78 | Ht 69.0 in | Wt 145.0 lb

## 2022-07-21 DIAGNOSIS — I10 Essential (primary) hypertension: Secondary | ICD-10-CM | POA: Insufficient documentation

## 2022-07-21 DIAGNOSIS — I4821 Permanent atrial fibrillation: Secondary | ICD-10-CM | POA: Insufficient documentation

## 2022-07-21 DIAGNOSIS — I350 Nonrheumatic aortic (valve) stenosis: Secondary | ICD-10-CM | POA: Insufficient documentation

## 2022-07-21 DIAGNOSIS — R600 Localized edema: Secondary | ICD-10-CM | POA: Diagnosis not present

## 2022-07-21 NOTE — Patient Instructions (Signed)
Medication Instructions:  Your physician recommends that you continue on your current medications as directed. Please refer to the Current Medication list given to you today.  *If you need a refill on your cardiac medications before your next appointment, please call your pharmacy*   Lab Work: NONE ordered at this time of appointment   If you have labs (blood work) drawn today and your tests are completely normal, you will receive your results only by: MyChart Message (if you have MyChart) OR A paper copy in the mail If you have any lab test that is abnormal or we need to change your treatment, we will call you to review the results.   Testing/Procedures: NONE ordered at this time of appointment     Follow-Up: At [facility], you and your health needs are our priority.  As part of our continuing mission to provide you with exceptional heart care, we have created designated Provider Care Teams.  These Care Teams include your primary Cardiologist (physician) and Advanced Practice Providers (APPs -  Physician Assistants and Nurse Practitioners) who all work together to provide you with the care you need, when you need it.  We recommend signing up for the patient portal called "MyChart".  Sign up information is provided on this After Visit Summary.  MyChart is used to connect with patients for Virtual Visits (Telemedicine).  Patients are able to view lab/test results, encounter notes, upcoming appointments, etc.  Non-urgent messages can be sent to your provider as well.   To learn more about what you can do with MyChart, go to [email or web address].    Your next appointment:    Keep follow up  Provider:   Orbie Pyo, MD     Other Instructions Increase physical activity as tolerated. Start going back to the [facility].

## 2022-07-26 DIAGNOSIS — R600 Localized edema: Secondary | ICD-10-CM | POA: Diagnosis not present

## 2022-07-26 DIAGNOSIS — Z7901 Long term (current) use of anticoagulants: Secondary | ICD-10-CM | POA: Diagnosis not present

## 2022-07-26 DIAGNOSIS — D649 Anemia, unspecified: Secondary | ICD-10-CM | POA: Diagnosis not present

## 2022-07-26 DIAGNOSIS — I4892 Unspecified atrial flutter: Secondary | ICD-10-CM | POA: Diagnosis not present

## 2022-07-26 DIAGNOSIS — J849 Interstitial pulmonary disease, unspecified: Secondary | ICD-10-CM | POA: Diagnosis not present

## 2022-07-26 DIAGNOSIS — I350 Nonrheumatic aortic (valve) stenosis: Secondary | ICD-10-CM | POA: Diagnosis not present

## 2022-07-26 DIAGNOSIS — M3130 Wegener's granulomatosis without renal involvement: Secondary | ICD-10-CM | POA: Diagnosis not present

## 2022-07-26 DIAGNOSIS — E871 Hypo-osmolality and hyponatremia: Secondary | ICD-10-CM | POA: Diagnosis not present

## 2022-07-27 ENCOUNTER — Ambulatory Visit (INDEPENDENT_AMBULATORY_CARE_PROVIDER_SITE_OTHER): Payer: Medicare Other | Admitting: Pulmonary Disease

## 2022-07-27 ENCOUNTER — Encounter: Payer: Self-pay | Admitting: Pulmonary Disease

## 2022-07-27 VITALS — BP 118/58 | HR 68 | Temp 97.7°F | Ht 69.0 in | Wt 145.8 lb

## 2022-07-27 DIAGNOSIS — R0602 Shortness of breath: Secondary | ICD-10-CM | POA: Diagnosis not present

## 2022-07-27 DIAGNOSIS — R918 Other nonspecific abnormal finding of lung field: Secondary | ICD-10-CM

## 2022-07-27 DIAGNOSIS — M3130 Wegener's granulomatosis without renal involvement: Secondary | ICD-10-CM | POA: Diagnosis not present

## 2022-07-27 DIAGNOSIS — J4550 Severe persistent asthma, uncomplicated: Secondary | ICD-10-CM | POA: Diagnosis not present

## 2022-07-27 NOTE — Progress Notes (Signed)
Chad Morrison    [id number]    02/01/1933  Primary Care Physician:Tisovec, Adelfa Koh, MD  Referring Physician: Gaspar Garbe, MD [address] [place],  [place] [address]  Chief complaint: Follow-up for asthma  HPI: 89 y.o.  with severe asthma, acid reflux, LPR, hypogammaglobilinemia, granulomatosis with polyangiitis, atrial fibrillation, aortic stenosis is doing okay  Maintained on Symbicort inhaler. Evaluated by Dr. Lucie Leather from allergy and started on Fasenra in Oct 2021.   History notable for granulomatosis with polyangiitis.  He follows at [name] for this.  Previously on methotrexate and prednisone.  His prednisone has been weaned off and methotrexate is coming down as well.  He feels that his asthma symptoms were under good control while he was on the prednisone but has flared up once he was offered.  Prednisone tapers usually helps him with his symptoms. Off methotrexate in March 2022 per his [employer] on Nexium for GERD.  He was started on budesonide until nasal sprays and Nexium increased to twice a day by Dr. Lucie Leather but has been noncompliant with this change  Pets: No pets Occupation: Retired Sport and exercise psychologist Exposures: No known exposures.  No mold, hot tub, Jacuzzi.  No feather pillows or comforter Smoking history: 4-pack-year smoker.  Quit in 1963 Travel history: Originally from [place].  No significant recent travel Relevant family history: No significant family history of lung disease  Interim history: Hospitalized in February 2023 with weakness, fatigue and bilateral lower lobe inflammatory changes.  He was treated for community-acquired pneumonia and discharged on room air.    Post discharge is improved and is here for follow-up of CT scan Continues on Fasenra, inhalers under the care of Dr. Gaspar Cola to have cardiac issues with progression of aortic stenosis, hyponatremia and also anemia for which he required an iron  infusion.  Follow-up echocardiogram on 5/14 continues to show severe AS for which she sees Dr. Lynnette Caffey  Hospitalized in early April with lower extremity swelling, profound hyponatremia.  Found to be hypothyroid for which IV levothyroxine was adjusted.  He also had abnormal ACTH stim test for which repeat testing by primary care was recommended.  All hospital, cardiology and allergy  Outpatient Encounter Medications as of 07/27/2022  Medication Sig   albuterol (VENTOLIN HFA) 108 (90 Base) MCG/ACT inhaler Inhale 2 puffs into the lungs every 4 (four) hours as needed for wheezing or shortness of breath.   ALPRAZolam (XANAX) 0.25 MG tablet Take 0.125 mg by mouth at bedtime.   apixaban (ELIQUIS) 5 MG TABS tablet Take 1 tablet (5 mg total) by mouth 2 (two) times daily.   Ascorbic Acid (VITAMIN C PO) Take 1 tablet by mouth every morning. Not sure the dosage   azelastine (ASTELIN) 0.1 % nasal spray Place 2 sprays into both nostrils 2 (two) times daily.   Benralizumab (FASENRA [place]) Inject into the skin every 8 (eight) weeks.   Calcium Carbonate-Vitamin D (CALCIUM-VITAMIN D) 600-125 MG-UNIT TABS Take 1 tablet by mouth 2 (two) times daily.    diltiazem (CARDIZEM CD) 180 MG 24 hr capsule TAKE 1 CAPSULE EVERY       MORNING AND AT BEDTIME   EPINEPHrine 0.3 mg/0.3 mL IJ SOAJ injection SMARTSIG:0.3 Milligram(s) IM Once   esomeprazole (NEXIUM) 40 MG capsule Take 1 capsule (40 mg total) by mouth 2 (two) times daily.   Ferrous Sulfate (SLOW FE PO) Take 1 tablet by mouth every morning.   furosemide (LASIX) 20 MG  tablet Take 20 mg by mouth daily.   levocetirizine (XYZAL) 5 MG tablet TAKE 1 TABLET EVERY EVENING   levothyroxine (SYNTHROID) 88 MCG tablet Take 88 mcg by mouth daily before breakfast.   montelukast (SINGULAIR) 10 MG tablet Take 1 tablet (10 mg total) by mouth at bedtime.   Multiple Vitamin (MULTIVITAMIN WITH MINERALS) TABS tablet Take 1 tablet daily by mouth.   Multiple Vitamins-Minerals (OCUVITE  PRESERVISION PO) Take 1 tablet 2 (two) times daily by mouth.   Polyethyl Glycol-Propyl Glycol (SYSTANE OP) Place 1 drop into both eyes daily as needed (Dry eye).   Potassium Bicarb-Citric Acid 20 MEQ TBEF Take 1 tablet by mouth daily.   rosuvastatin (CRESTOR) 5 MG tablet TAKE 1 TABLET DAILY   SYMBICORT 160-4.5 MCG/ACT inhaler Inhale 2 puffs into the lungs in the morning and at bedtime.   [DISCONTINUED] potassium chloride SA (KLOR-CON M) 20 MEQ tablet Take 20 mEq by mouth daily.   Facility-Administered Encounter Medications as of 07/27/2022  Medication   Benralizumab SOSY 30 mg   Physical Exam: Blood pressure (!) 118/58, pulse 68, temperature 97.7 F (36.5 C), temperature source Oral, height 5\' 9"  (1.753 m), weight 145 lb 12.8 oz (66.1 kg), SpO2 98 %. Gen:      No acute distress HEENT:  EOMI, sclera anicteric Neck:     No masses; no thyromegaly Lungs:    Clear to auscultation bilaterally; normal respiratory effort CV:         Systolic murmur Abd:      + bowel sounds; soft, non-tender; no palpable masses, no distension Ext:    No edema; adequate peripheral perfusion Skin:      Warm and dry; no rash Neuro: alert and oriented x 3 Psych: normal mood and affect   Data Reviewed: Imaging: High-resolution CT 12/22/2017-mild bibasal groundglass with mild reticulation.  Pulmonary nodules. CT chest 05/06/2019-stable pulmonary nodules since 2019.  Scattered linear atelectasis and mild bronchiectasis High-resolution CT 05/10/2021-no evidence of interstitial lung disease CT chest 04/12/2022-bronchiectasis, consolidative changes, scattered areas of bandlike opacities. Possible underlying fibrotic changes.  High resolution CT chest/8/24-mild mosaic attenuation, trace effusion, lower lung opacities consistent with scarring/atelectasis I reviewed the images personally.  PFTs: 05/24/2017 FVC 3.78 [96%], FEV1 2.44 [88%], F/F 65, TLC 7.41 [104%], DLCO 22.81 [90%] Moderate obstruction with air trapping and  mild diffusion impairment  ACT score 12/07/2019- 19 ACT score 05/05/2020- 23 ACT score 11/24/2020-25  Labs: CBC 10/22/2019-WBC 5.7, eos 14%, absolute eosinophil count 798  Assessment:  Recent admission for community acquired pneumonia Making a slow recovery but still has some fatigue CT scan shows some changes at the base that may be scarring/atelectasis. It is unclear if he has developed an interstitial lung disease.  Follow-up CT has been ordered for next year.  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  His disease appears to be in remission  Severe aortic stenosis Follows with cardiology.  Pulmonary nodule Noted on last CT scan.  Will reevaluate on follow-up scan.  Plan/Recommendations: Continue Symbicort, Fasenra Follow-up CT in 1 year  Chilton Greathouse MD [facility] 07/27/2022, 2:28 PM  CC: Tisovec, Adelfa Koh, MD

## 2022-07-27 NOTE — Patient Instructions (Signed)
I am glad you are doing well with your breathing and your asthma is under good control Your CT scan does not show any significant interstitial lung disease which is good news We have already ordered a follow-up CT next year Return to clinic in 1 year to review CT scan

## 2022-08-02 ENCOUNTER — Ambulatory Visit (INDEPENDENT_AMBULATORY_CARE_PROVIDER_SITE_OTHER): Payer: Medicare Other | Admitting: *Deleted

## 2022-08-02 DIAGNOSIS — J4550 Severe persistent asthma, uncomplicated: Secondary | ICD-10-CM | POA: Diagnosis not present

## 2022-08-08 ENCOUNTER — Other Ambulatory Visit (HOSPITAL_COMMUNITY): Payer: Medicare Other

## 2022-08-08 DIAGNOSIS — H0012 Chalazion right lower eyelid: Secondary | ICD-10-CM | POA: Diagnosis not present

## 2022-08-09 DIAGNOSIS — C44612 Basal cell carcinoma of skin of right upper limb, including shoulder: Secondary | ICD-10-CM | POA: Diagnosis not present

## 2022-08-10 DIAGNOSIS — E039 Hypothyroidism, unspecified: Secondary | ICD-10-CM | POA: Diagnosis not present

## 2022-08-18 DIAGNOSIS — R946 Abnormal results of thyroid function studies: Secondary | ICD-10-CM | POA: Diagnosis not present

## 2022-08-18 DIAGNOSIS — I700 Atherosclerosis of aorta: Secondary | ICD-10-CM | POA: Diagnosis not present

## 2022-08-18 DIAGNOSIS — Z125 Encounter for screening for malignant neoplasm of prostate: Secondary | ICD-10-CM | POA: Diagnosis not present

## 2022-08-18 DIAGNOSIS — M8580 Other specified disorders of bone density and structure, unspecified site: Secondary | ICD-10-CM | POA: Diagnosis not present

## 2022-08-18 DIAGNOSIS — E7800 Pure hypercholesterolemia, unspecified: Secondary | ICD-10-CM | POA: Diagnosis not present

## 2022-08-22 ENCOUNTER — Ambulatory Visit: Payer: Medicare Other | Admitting: Internal Medicine

## 2022-08-25 DIAGNOSIS — H353122 Nonexudative age-related macular degeneration, left eye, intermediate dry stage: Secondary | ICD-10-CM | POA: Diagnosis not present

## 2022-08-25 DIAGNOSIS — H35371 Puckering of macula, right eye: Secondary | ICD-10-CM | POA: Diagnosis not present

## 2022-08-25 DIAGNOSIS — H353212 Exudative age-related macular degeneration, right eye, with inactive choroidal neovascularization: Secondary | ICD-10-CM | POA: Diagnosis not present

## 2022-08-25 DIAGNOSIS — H35351 Cystoid macular degeneration, right eye: Secondary | ICD-10-CM | POA: Diagnosis not present

## 2022-08-25 DIAGNOSIS — H353114 Nonexudative age-related macular degeneration, right eye, advanced atrophic with subfoveal involvement: Secondary | ICD-10-CM | POA: Diagnosis not present

## 2022-08-25 DIAGNOSIS — H30041 Focal chorioretinal inflammation, macular or paramacular, right eye: Secondary | ICD-10-CM | POA: Diagnosis not present

## 2022-08-29 DIAGNOSIS — E7800 Pure hypercholesterolemia, unspecified: Secondary | ICD-10-CM | POA: Diagnosis not present

## 2022-08-29 DIAGNOSIS — I4892 Unspecified atrial flutter: Secondary | ICD-10-CM | POA: Diagnosis not present

## 2022-08-29 DIAGNOSIS — Z1331 Encounter for screening for depression: Secondary | ICD-10-CM | POA: Diagnosis not present

## 2022-08-29 DIAGNOSIS — D8989 Other specified disorders involving the immune mechanism, not elsewhere classified: Secondary | ICD-10-CM | POA: Diagnosis not present

## 2022-08-29 DIAGNOSIS — Z8546 Personal history of malignant neoplasm of prostate: Secondary | ICD-10-CM | POA: Diagnosis not present

## 2022-08-29 DIAGNOSIS — I700 Atherosclerosis of aorta: Secondary | ICD-10-CM | POA: Diagnosis not present

## 2022-08-29 DIAGNOSIS — Z1339 Encounter for screening examination for other mental health and behavioral disorders: Secondary | ICD-10-CM | POA: Diagnosis not present

## 2022-08-29 DIAGNOSIS — J4540 Moderate persistent asthma, uncomplicated: Secondary | ICD-10-CM | POA: Diagnosis not present

## 2022-08-29 DIAGNOSIS — Z0000 Encounter for general adult medical examination without abnormal findings: Secondary | ICD-10-CM | POA: Diagnosis not present

## 2022-08-29 DIAGNOSIS — Z7901 Long term (current) use of anticoagulants: Secondary | ICD-10-CM | POA: Diagnosis not present

## 2022-08-29 DIAGNOSIS — J849 Interstitial pulmonary disease, unspecified: Secondary | ICD-10-CM | POA: Diagnosis not present

## 2022-08-29 DIAGNOSIS — I350 Nonrheumatic aortic (valve) stenosis: Secondary | ICD-10-CM | POA: Diagnosis not present

## 2022-09-06 DIAGNOSIS — Z85820 Personal history of malignant melanoma of skin: Secondary | ICD-10-CM | POA: Diagnosis not present

## 2022-09-06 DIAGNOSIS — L821 Other seborrheic keratosis: Secondary | ICD-10-CM | POA: Diagnosis not present

## 2022-09-06 DIAGNOSIS — L814 Other melanin hyperpigmentation: Secondary | ICD-10-CM | POA: Diagnosis not present

## 2022-09-06 DIAGNOSIS — Z85828 Personal history of other malignant neoplasm of skin: Secondary | ICD-10-CM | POA: Diagnosis not present

## 2022-09-06 DIAGNOSIS — D225 Melanocytic nevi of trunk: Secondary | ICD-10-CM | POA: Diagnosis not present

## 2022-09-06 DIAGNOSIS — C44612 Basal cell carcinoma of skin of right upper limb, including shoulder: Secondary | ICD-10-CM | POA: Diagnosis not present

## 2022-09-06 DIAGNOSIS — C44319 Basal cell carcinoma of skin of other parts of face: Secondary | ICD-10-CM | POA: Diagnosis not present

## 2022-09-06 DIAGNOSIS — L570 Actinic keratosis: Secondary | ICD-10-CM | POA: Diagnosis not present

## 2022-09-06 DIAGNOSIS — Z08 Encounter for follow-up examination after completed treatment for malignant neoplasm: Secondary | ICD-10-CM | POA: Diagnosis not present

## 2022-09-06 DIAGNOSIS — C44519 Basal cell carcinoma of skin of other part of trunk: Secondary | ICD-10-CM | POA: Diagnosis not present

## 2022-09-28 ENCOUNTER — Ambulatory Visit (INDEPENDENT_AMBULATORY_CARE_PROVIDER_SITE_OTHER): Payer: Medicare Other

## 2022-09-28 DIAGNOSIS — J4550 Severe persistent asthma, uncomplicated: Secondary | ICD-10-CM

## 2022-10-20 DIAGNOSIS — H353122 Nonexudative age-related macular degeneration, left eye, intermediate dry stage: Secondary | ICD-10-CM | POA: Diagnosis not present

## 2022-10-20 DIAGNOSIS — H353114 Nonexudative age-related macular degeneration, right eye, advanced atrophic with subfoveal involvement: Secondary | ICD-10-CM | POA: Diagnosis not present

## 2022-10-20 DIAGNOSIS — H4040X0 Glaucoma secondary to eye inflammation, unspecified eye, stage unspecified: Secondary | ICD-10-CM | POA: Diagnosis not present

## 2022-10-20 DIAGNOSIS — H35371 Puckering of macula, right eye: Secondary | ICD-10-CM | POA: Diagnosis not present

## 2022-10-20 DIAGNOSIS — H30001 Unspecified focal chorioretinal inflammation, right eye: Secondary | ICD-10-CM | POA: Diagnosis not present

## 2022-10-20 DIAGNOSIS — H35351 Cystoid macular degeneration, right eye: Secondary | ICD-10-CM | POA: Diagnosis not present

## 2022-10-20 DIAGNOSIS — H04122 Dry eye syndrome of left lacrimal gland: Secondary | ICD-10-CM | POA: Diagnosis not present

## 2022-10-20 DIAGNOSIS — H30041 Focal chorioretinal inflammation, macular or paramacular, right eye: Secondary | ICD-10-CM | POA: Diagnosis not present

## 2022-10-20 DIAGNOSIS — H353212 Exudative age-related macular degeneration, right eye, with inactive choroidal neovascularization: Secondary | ICD-10-CM | POA: Diagnosis not present

## 2022-10-23 NOTE — Progress Notes (Unsigned)
Patient ID: Chad Morrison MRN: [id number] DOB/AGE: 08/31/1932 89 y.o.  Primary Care Physician:Tisovec, Adelfa Koh, MD Primary Cardiologist: Philbert Riser CARDIOVASCULAR PROBLEM LIST:   1.  Severe aortic stenosis with an aortic valve area 0.7cm2, mean gradient of 42 mmHg with preserved ejection fraction; EKG demonstrates atrial fibrillation with right bundle branch block 2.  Atrial fibrillation on anticoagulation 3.  Hypertension 4.  Hyperlipidemia 5.  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 male with the indicated medical history here for recommendations regarding his severe aortic stenosis seen on echocardiogram.  The patient was referred by nurse practitioner Caroll in the atrial fibrillation clinic the patient was seen in April of this year and was doing relatively well.   The patient is here with his wife.  He is relatively active.  He exercises at the [facility] on a regular basis using a stationary bike.  He denies any exertional dyspnea, exertional angina, presyncope or syncope.  His atrial fibrillation has not really bother him whatsoever.  He has not required emergency room visits or hospitalizations recently for any breathing difficulties.  He has had no issues with bleeding or bruising while on Eliquis.  In terms of other symptoms his Wegener's granulomatosis is very well controlled on immunotherapy.   He is been retired for a long time.  He moved to [place] some years ago because his son is here.  He used to live in [place] since the 90s.  He does not smoke or drink.  He sees a Education officer, community on a regular basis and reports very good dental health.  Plan:  Follow up in 3 months.  August 2023:  The patient returns for 25-month follow-up.  The patient was seen in April regarding his aortic valvular disease.  At that point in time he was he was doing relatively well and was asymptomatic with  regards to his aortic stenosis.  The patient is here with his wife.  He continues to be very active.  He walks [employer] park on a regular basis.  When he goes up hills he might have to stop but this is relatively unchanged versus prior.  He has required no emergency room visits or hospitalizations.  He denies any exertional angina, presyncope or syncope.  His biggest issue recently was left eye pain and this is being actively managed by ophthalmology.  Plan:  Monitor for now, follow up 3 months.  February 2024: In the interim since I saw him last an echocardiogram was done which demonstrated progression to severe aortic stenosis with a preserved ejection fraction.  He was recently admitted to the hospital for probable pneumonia and treated with antibiotics.  He was in the hospital for 1 night.  He is about 12 days out from his hospitalization.  He is feeling better but not back up to speed.  He is still able to [place] and do exercises at the [facility] with only slight shortness of breath.  He does not believe that this is too limiting.  He denies any presyncope, syncope, or chest pain.  Plan: 72-month follow-up.  August 2024: The patient was admitted to the hospital with lower extremity edema and found to be profoundly hyponatremic.  His hyponatremia was thought to be due to inadequately treated hypothyroidism.  He was administered IV levothyroxine with improvement in his edema and sodium levels.  He was seen in cardiology clinic in April and  was improved however he did note some shortness of breath with activity.  2+ peripheral edema was present.  His Lasix was increased to 40 mg twice a day for 2 days.  He was seen in atrial fibrillation clinic and again in general cardiology clinic after that and was feeling well without any reports of dyspnea or exertional angina.  He continues to do well.  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   However he never gets short of breath when he has to stop and take a rest because of his leg fatigue.  He denies any chest pain, presyncope, or syncope.  He is walking about a mile or 2 every day.  [facility]  He is getting stronger since being discharged from the hospital.  He does see a dentist on a regular basis and reports good dental health.  Past Medical History:  Diagnosis Date   Anxiety    Asthma    Asthmatic bronchitis    Atrial flutter (HCC)    chads2vasc score is 3   Ehrlichiosis 2008   GERD (gastroesophageal reflux disease)    Granulomatosis with polyangiitis (HCC)    HTN (hypertension)    Hyperlipidemia    Nuclear sclerotic cataract of left eye 06/04/2019   Cataract surgery completed March 2023, Dr. Vonna Kotyk   Paroxysmal atrial fibrillation [facility])    Prostate cancer (HCC)    Seasonal allergies     Past Surgical History:  Procedure Laterality Date   BASAL CELL CARCINOMA EXCISION  2013   EYELID   HERNIA REPAIR Bilateral 2000, 2002   KNEE ARTHROSCOPY  2000   prostectomy  1995   RADICAL   TONSILLECTOMY  1940    Family History  Problem Relation Age of Onset   Asthma Mother 83   Allergies Mother    Ulcers Mother    CVA Father 82   Heart Problems Father    Other Father        NEUROLOGIC ISSUES    Other Brother 57       BICYCLE ACCIDENT   Migraines Son    Diabetes Mellitus I Son     Social History   Socioeconomic History   Marital status: Married    Spouse name: [name]   Number of children: 2   Years of education: Not on file   Highest education level: Not on file  Occupational History   Occupation: RETIRED  Tobacco Use   Smoking status: Former    Current packs/day: 0.00    Average packs/day: 0.5 packs/day for 4.0 years (2.0 ttl pk-yrs)    Types: Cigarettes    Start date: 02/28/1957    Quit date: 02/28/1961    Years since quitting: 61.6   Smokeless tobacco: Never   Tobacco comments:    Former smoker 06/20/22  Vaping Use    Vaping status: Never Used  Substance and Sexual Activity   Alcohol use: Yes    Alcohol/week: 2.0 - 3.0 standard drinks of alcohol    Types: 2 - 3 Standard drinks or equivalent per week   Drug use: No   Sexual activity: Not on file  Other Topics Concern   Not on file  Social History Narrative   Pt recently moved to [place] from [place] to be near his son.   Retired.   Social Determinants of Health   Financial Resource Strain: Not on file  Food Insecurity: No  Food Insecurity (05/31/2022)   Hunger Vital Sign    Worried About Running Out of Food in the Last Year: Never true    Ran Out of Food in the Last Year: Never true  Transportation Needs: No Transportation Needs (05/31/2022)   PRAPARE - Administrator, Civil Service (Medical): No    Lack of Transportation (Non-Medical): No  Physical Activity: Not on file  Stress: Not on file  Social Connections: Not on file  Intimate Partner Violence: Not At Risk (05/31/2022)   Humiliation, Afraid, Rape, and Kick questionnaire    Fear of Current or Ex-Partner: No    Emotionally Abused: No    Physically Abused: No    Sexually Abused: No     Prior to Admission medications   Medication Sig Start Date End Date Taking? Authorizing Provider  albuterol (VENTOLIN HFA) 108 (90 Base) MCG/ACT inhaler Inhale 2 puffs into the lungs every 4 (four) hours as needed for wheezing or shortness of breath. 06/22/21   Kozlow, Alvira Philips, MD  ALPRAZolam Prudy Feeler) 0.25 MG tablet Take 0.25 mg by mouth at bedtime as needed for sleep.     [provider]  Ascorbic Acid (VITAMIN C PO) Take 1 tablet by mouth every morning. Not sure the dosage    [provider]  azelastine (ASTELIN) 0.1 % nasal spray Place 1 spray into both nostrils 2 (two) times daily. 03/31/21   [provider]  budesonide-formoterol (SYMBICORT) 160-4.5 MCG/ACT inhaler Inhale 2 puffs into the lungs in the morning and at bedtime. 06/22/21   Kozlow, Alvira Philips, MD  Calcium  Carbonate-Vitamin D (CALCIUM-VITAMIN D) 600-125 MG-UNIT TABS Take 1 tablet by mouth 2 (two) times daily.     [provider]  diltiazem (CARDIZEM CD) 180 MG 24 hr capsule TAKE 1 CAPSULE EVERY       MORNING AND AT BEDTIME 02/23/21   Fenton, Clint R, PA  ELIQUIS 5 MG TABS tablet TAKE 1 TABLET TWICE A DAY 02/23/21   Fenton, Clint R, PA  EPINEPHrine 0.3 mg/0.3 mL IJ SOAJ injection SMARTSIG:0.3 Milligram(s) IM Once 06/22/21   Kozlow, Alvira Philips, MD  esomeprazole (NEXIUM) 40 MG capsule Take 1 capsule (40 mg total) by mouth 2 (two) times daily. 06/22/21   Kozlow, Alvira Philips, MD  Ferrous Sulfate (SLOW FE PO) Take 1 tablet by mouth every morning.    [provider]  fluorouracil (EFUDEX) 5 % cream SMARTSIG:sparingly Topical Twice Daily 05/18/21   [provider]  levocetirizine (XYZAL) 5 MG tablet Take 1 tablet (5 mg total) by mouth 2 (two) times daily as needed for allergies (Can take an extra dose during flare ups.). 06/22/21   Kozlow, Alvira Philips, MD  levothyroxine (SYNTHROID, LEVOTHROID) 75 MCG tablet Take 75 mcg by mouth daily before breakfast.    [provider]  montelukast (SINGULAIR) 10 MG tablet Take 1 tablet (10 mg total) by mouth at bedtime. 06/22/21   Kozlow, Alvira Philips, MD  Multiple Vitamin (MULTIVITAMIN WITH MINERALS) TABS tablet Take 1 tablet daily by mouth.    [provider]  Multiple Vitamins-Minerals (OCUVITE PRESERVISION PO) Take 1 tablet 2 (two) times daily by mouth.    [provider]  Polyethyl Glycol-Propyl Glycol (SYSTANE OP) Apply 1 drop to eye every morning.    [provider]  PROLENSA 0.07 % SOLN INSTILL 1 DROP INTO THE    RIGHT EYE DAILY 07/19/21   Rankin, Alford Highland, MD  Propylene Glycol 0.6 % SOLN Apply 1 drop to  eye in the morning.    [provider]  rosuvastatin (CRESTOR) 5 MG tablet TAKE 1 TABLET DAILY 01/19/21   Fenton, Clint R, PA    Allergies  Allergen Reactions   Penicillins Hives    Has patient had a PCN reaction  causing immediate rash, facial/tongue/throat swelling, SOB or lightheadedness with hypotension: No Has patient had a PCN reaction causing severe rash involving mucus membranes or skin necrosis: No Has patient had a PCN reaction that required hospitalization: No Has patient had a PCN reaction occurring within the last 10 years: Yes If all of the above answers are "NO", then may proceed with Cephalosporin use.    Latex Hives and Rash   Other Rash   Sulfa Antibiotics Nausea Only    Unknown to patient    Sulfamethoxazole-Trimethoprim Nausea Only    REVIEW OF SYSTEMS:  General: no fevers/chills/night sweats Eyes: no blurry vision, diplopia, or amaurosis ENT: no sore throat or hearing loss Resp: no cough, wheezing, or hemoptysis CV: no edema or palpitations GI: no abdominal pain, nausea, vomiting, diarrhea, or constipation GU: no dysuria, frequency, or hematuria Skin: no rash Neuro: no headache, numbness, tingling, or weakness of extremities Musculoskeletal: no joint pain or swelling Heme: no bleeding, DVT, or easy bruising Endo: no polydipsia or polyuria  BP 130/62   Pulse 73   Ht 5\' 9"  (1.753 m)   Wt 150 lb (68 kg)   SpO2 99%   BMI 22.15 kg/m   PHYSICAL EXAM: GEN:  AO x 3 in no acute distress HEENT: normal Dentition: Normal Neck: JVP normal. +2 carotid upstrokes without bruits. No thyromegaly. Lungs: equal expansion, clear bilaterally CV: Apex is discrete and nondisplaced, RRR with 3/6 SEM Abd: soft, non-tender, non-distended; no bruit; positive bowel sounds Ext: no edema, ecchymoses, or cyanosis Vascular: 2+ femoral pulses, 2+ radial pulses       Skin: warm and dry without rash Neuro: CN II-XII grossly intact; motor and sensory grossly intact    DATA AND STUDIES:  EKG: Atrial fibrillation with a right bundle branch block  2D ECHO: 2024 1. Left ventricular ejection fraction, by estimation, is 55 to 60%. The  left ventricle has normal function. The left ventricle  has no regional  wall motion abnormalities. There is mild concentric left ventricular  hypertrophy. Left ventricular diastolic  parameters are indeterminate.   2. Right ventricular systolic function is normal. The right ventricular  size is normal. Tricuspid regurgitation signal is inadequate for assessing  PA pressure.   3. Left atrial size was moderately dilated.   4. Right atrial size was moderately dilated.   5. The mitral valve is normal in structure. Mild mitral valve  regurgitation. No evidence of mitral stenosis.   6. The aortic valve is tricuspid. There is severe calcifcation of the  aortic valve. Aortic valve regurgitation is trivial. Severe aortic valve  stenosis. Aortic valve area, by VTI measures 0.68 cm. Aortic valve mean  gradient measures 42.5 mmHg.   7. Aortic dilatation noted. There is mild dilatation of the ascending  aorta, measuring 39 mm.   8. The inferior vena cava is normal in size with greater than 50%  respiratory variability, suggesting right atrial pressure of 3 mmHg.   9. The patient was in atrial fibrillation.   CARDIAC CATH: n/a  STS RISK CALCULATOR: pending  NHYA CLASS: 1    ASSESSMENT AND PLAN:   Severe aortic stenosis - Plan: ECHOCARDIOGRAM COMPLETE  Permanent atrial fibrillation (HCC)  Essential hypertension  The  patient remains relatively asymptomatic despite severe's aortic stenosis.  Certainly his lack of symptoms may be due to lower extremity myopathy seems to limit his exercise capacity.  However his ejection fraction is preserved on his last echocardiogram.  I think it is reasonable to continue to monitor the patient for LV dysfunction or worsening symptomatology.  I will see him back in 6 months with another echocardiogram.  He knows to contact our office for any worsening shortness of breath, chest pain, presyncope, or syncope.   Orbie Pyo, MD  10/24/2022 2:20 PM    [facility] [address] [place],  [place], [place]  [address] Phone: [phone]; Fax: [phone]

## 2022-10-24 ENCOUNTER — Encounter: Payer: Self-pay | Admitting: Internal Medicine

## 2022-10-24 ENCOUNTER — Ambulatory Visit: Payer: Medicare Other | Attending: Internal Medicine | Admitting: Internal Medicine

## 2022-10-24 VITALS — BP 130/62 | HR 73 | Ht 69.0 in | Wt 150.0 lb

## 2022-10-24 DIAGNOSIS — I4821 Permanent atrial fibrillation: Secondary | ICD-10-CM | POA: Insufficient documentation

## 2022-10-24 DIAGNOSIS — I10 Essential (primary) hypertension: Secondary | ICD-10-CM | POA: Diagnosis not present

## 2022-10-24 DIAGNOSIS — I350 Nonrheumatic aortic (valve) stenosis: Secondary | ICD-10-CM | POA: Insufficient documentation

## 2022-10-24 NOTE — Patient Instructions (Signed)
Medication Instructions:  The current medical regimen is effective;  continue present plan and medications.  *If you need a refill on your cardiac medications before your next appointment, please call your pharmacy*   Testing/Procedures: Your physician has requested that you have an echocardiogram in 6 months (before seeing Dr Lynnette Caffey). Echocardiography is a painless test that uses sound waves to create images of your heart. It provides your doctor with information about the size and shape of your heart and how well your heart's chambers and valves are working. This procedure takes approximately one hour. There are no restrictions for this procedure. Please do NOT wear cologne, perfume, aftershave, or lotions (deodorant is allowed). Please arrive 15 minutes prior to your appointment time.    Follow-Up: At [facility], you and your health needs are our priority.  As part of our continuing mission to provide you with exceptional heart care, we have created designated Provider Care Teams.  These Care Teams include your primary Cardiologist (physician) and Advanced Practice Providers (APPs -  Physician Assistants and Nurse Practitioners) who all work together to provide you with the care you need, when you need it.  We recommend signing up for the patient portal called "MyChart".  Sign up information is provided on this After Visit Summary.  MyChart is used to connect with patients for Virtual Visits (Telemedicine).  Patients are able to view lab/test results, encounter notes, upcoming appointments, etc.  Non-urgent messages can be sent to your provider as well.   To learn more about what you can do with MyChart, go to [email or web address].    Your next appointment:   6 month(s)  Provider:   Orbie Pyo, MD

## 2022-10-24 NOTE — Addendum Note (Signed)
Addended by: Alverda Skeans on: 10/24/2022 02:23 PM   Modules accepted: Level of Service

## 2022-10-27 DIAGNOSIS — D485 Neoplasm of uncertain behavior of skin: Secondary | ICD-10-CM | POA: Diagnosis not present

## 2022-10-27 DIAGNOSIS — C44319 Basal cell carcinoma of skin of other parts of face: Secondary | ICD-10-CM | POA: Diagnosis not present

## 2022-10-30 ENCOUNTER — Other Ambulatory Visit: Payer: Self-pay | Admitting: Allergy and Immunology

## 2022-11-02 ENCOUNTER — Other Ambulatory Visit: Payer: Self-pay | Admitting: Allergy and Immunology

## 2022-11-02 NOTE — Telephone Encounter (Signed)
Symbicort is no longer covered. Insurance will cover fluticasone-salmeterol (WIXELA INHUB) 250-50 MCG/ACT AEPB or fluticasone furoate-vilanterol (BREO ELLIPTA) 100-25 MCG/ACT AEPB. Please advise on medication change and use. Thank you.

## 2022-11-02 NOTE — Telephone Encounter (Signed)
Symbicort is not covered is it okay to change to wixela as it is covered?

## 2022-11-03 ENCOUNTER — Telehealth: Payer: Self-pay | Admitting: Allergy and Immunology

## 2022-11-03 ENCOUNTER — Encounter: Payer: Self-pay | Admitting: Allergy and Immunology

## 2022-11-03 MED ORDER — SYMBICORT 160-4.5 MCG/ACT IN AERO: INHALATION_SPRAY | RESPIRATORY_TRACT | 3 refills | Status: DC

## 2022-11-03 NOTE — Telephone Encounter (Signed)
Patient called stating he received a letter 3 months ago from Dr. Lucie Leather  that was sent to his insurance to approve his SYMBICORT his insurance is now requesting a new letter to approve more refills patient is asking for this letter to be sent along with a refill patient is asking for more than 3 refills so he will not have to have a letter every few months completed

## 2022-11-03 NOTE — Telephone Encounter (Signed)
PA approved on 03/18/22:  02/28/22 - 03/18/23  New prescription sent to [facility].

## 2022-11-10 DIAGNOSIS — H30041 Focal chorioretinal inflammation, macular or paramacular, right eye: Secondary | ICD-10-CM | POA: Diagnosis not present

## 2022-11-10 DIAGNOSIS — H353114 Nonexudative age-related macular degeneration, right eye, advanced atrophic with subfoveal involvement: Secondary | ICD-10-CM | POA: Diagnosis not present

## 2022-11-10 DIAGNOSIS — H353212 Exudative age-related macular degeneration, right eye, with inactive choroidal neovascularization: Secondary | ICD-10-CM | POA: Diagnosis not present

## 2022-11-10 DIAGNOSIS — H353122 Nonexudative age-related macular degeneration, left eye, intermediate dry stage: Secondary | ICD-10-CM | POA: Diagnosis not present

## 2022-11-10 DIAGNOSIS — H4041X0 Glaucoma secondary to eye inflammation, right eye, stage unspecified: Secondary | ICD-10-CM | POA: Diagnosis not present

## 2022-11-10 DIAGNOSIS — H35371 Puckering of macula, right eye: Secondary | ICD-10-CM | POA: Diagnosis not present

## 2022-11-10 DIAGNOSIS — H04122 Dry eye syndrome of left lacrimal gland: Secondary | ICD-10-CM | POA: Diagnosis not present

## 2022-11-10 DIAGNOSIS — H35351 Cystoid macular degeneration, right eye: Secondary | ICD-10-CM | POA: Diagnosis not present

## 2022-11-15 DIAGNOSIS — C44319 Basal cell carcinoma of skin of other parts of face: Secondary | ICD-10-CM | POA: Diagnosis not present

## 2022-11-17 DIAGNOSIS — H01115 Allergic dermatitis of left lower eyelid: Secondary | ICD-10-CM | POA: Diagnosis not present

## 2022-11-23 ENCOUNTER — Ambulatory Visit (INDEPENDENT_AMBULATORY_CARE_PROVIDER_SITE_OTHER): Payer: Medicare Other

## 2022-11-23 DIAGNOSIS — J4550 Severe persistent asthma, uncomplicated: Secondary | ICD-10-CM | POA: Diagnosis not present

## 2022-11-23 MED ORDER — SYMBICORT 160-4.5 MCG/ACT IN AERO: INHALATION_SPRAY | RESPIRATORY_TRACT | 2 refills | Status: DC

## 2022-11-23 NOTE — Addendum Note (Signed)
Addended by: Areta Haber B on: 11/23/2022 11:20 AM   Modules accepted: Orders

## 2022-11-23 NOTE — Telephone Encounter (Signed)
Patient came in - DOB verified- stated he only received 1 Symbicort from [facility].  Patient advised I would send in new prescription w/90 day supply.  Patient verbalized understanding, no further questions.

## 2022-12-01 DIAGNOSIS — I700 Atherosclerosis of aorta: Secondary | ICD-10-CM | POA: Diagnosis not present

## 2022-12-01 DIAGNOSIS — R946 Abnormal results of thyroid function studies: Secondary | ICD-10-CM | POA: Diagnosis not present

## 2022-12-01 DIAGNOSIS — I4892 Unspecified atrial flutter: Secondary | ICD-10-CM | POA: Diagnosis not present

## 2022-12-01 DIAGNOSIS — J4540 Moderate persistent asthma, uncomplicated: Secondary | ICD-10-CM | POA: Diagnosis not present

## 2022-12-01 DIAGNOSIS — Z0000 Encounter for general adult medical examination without abnormal findings: Secondary | ICD-10-CM | POA: Diagnosis not present

## 2022-12-01 DIAGNOSIS — M8580 Other specified disorders of bone density and structure, unspecified site: Secondary | ICD-10-CM | POA: Diagnosis not present

## 2022-12-01 DIAGNOSIS — Z1389 Encounter for screening for other disorder: Secondary | ICD-10-CM | POA: Diagnosis not present

## 2022-12-01 DIAGNOSIS — J849 Interstitial pulmonary disease, unspecified: Secondary | ICD-10-CM | POA: Diagnosis not present

## 2022-12-01 DIAGNOSIS — R2689 Other abnormalities of gait and mobility: Secondary | ICD-10-CM | POA: Diagnosis not present

## 2022-12-01 DIAGNOSIS — Z7901 Long term (current) use of anticoagulants: Secondary | ICD-10-CM | POA: Diagnosis not present

## 2022-12-01 DIAGNOSIS — E7800 Pure hypercholesterolemia, unspecified: Secondary | ICD-10-CM | POA: Diagnosis not present

## 2022-12-01 DIAGNOSIS — M3130 Wegener's granulomatosis without renal involvement: Secondary | ICD-10-CM | POA: Diagnosis not present

## 2022-12-01 DIAGNOSIS — E871 Hypo-osmolality and hyponatremia: Secondary | ICD-10-CM | POA: Diagnosis not present

## 2022-12-01 DIAGNOSIS — N393 Stress incontinence (female) (male): Secondary | ICD-10-CM | POA: Diagnosis not present

## 2022-12-01 DIAGNOSIS — D8989 Other specified disorders involving the immune mechanism, not elsewhere classified: Secondary | ICD-10-CM | POA: Diagnosis not present

## 2022-12-10 DIAGNOSIS — Z23 Encounter for immunization: Secondary | ICD-10-CM | POA: Diagnosis not present

## 2022-12-23 DIAGNOSIS — H18413 Arcus senilis, bilateral: Secondary | ICD-10-CM | POA: Diagnosis not present

## 2022-12-23 DIAGNOSIS — Z961 Presence of intraocular lens: Secondary | ICD-10-CM | POA: Diagnosis not present

## 2022-12-23 DIAGNOSIS — H40013 Open angle with borderline findings, low risk, bilateral: Secondary | ICD-10-CM | POA: Diagnosis not present

## 2022-12-23 DIAGNOSIS — H353132 Nonexudative age-related macular degeneration, bilateral, intermediate dry stage: Secondary | ICD-10-CM | POA: Diagnosis not present

## 2022-12-27 ENCOUNTER — Other Ambulatory Visit: Payer: Self-pay

## 2022-12-27 ENCOUNTER — Encounter: Payer: Self-pay | Admitting: Allergy and Immunology

## 2022-12-27 ENCOUNTER — Ambulatory Visit (INDEPENDENT_AMBULATORY_CARE_PROVIDER_SITE_OTHER): Payer: Medicare Other | Admitting: Allergy and Immunology

## 2022-12-27 VITALS — BP 118/60 | HR 58 | Temp 98.3°F | Resp 16 | Ht 69.0 in | Wt 151.9 lb

## 2022-12-27 DIAGNOSIS — D801 Nonfamilial hypogammaglobulinemia: Secondary | ICD-10-CM

## 2022-12-27 DIAGNOSIS — J3089 Other allergic rhinitis: Secondary | ICD-10-CM | POA: Diagnosis not present

## 2022-12-27 DIAGNOSIS — K219 Gastro-esophageal reflux disease without esophagitis: Secondary | ICD-10-CM | POA: Diagnosis not present

## 2022-12-27 DIAGNOSIS — D472 Monoclonal gammopathy: Secondary | ICD-10-CM

## 2022-12-27 DIAGNOSIS — M3130 Wegener's granulomatosis without renal involvement: Secondary | ICD-10-CM

## 2022-12-27 DIAGNOSIS — J3489 Other specified disorders of nose and nasal sinuses: Secondary | ICD-10-CM

## 2022-12-27 DIAGNOSIS — J4550 Severe persistent asthma, uncomplicated: Secondary | ICD-10-CM

## 2022-12-27 MED ORDER — ESOMEPRAZOLE MAGNESIUM 40 MG PO CPDR: 40.0000 mg | DELAYED_RELEASE_CAPSULE | Freq: Two times a day (BID) | ORAL | 1 refills | Status: DC

## 2022-12-27 MED ORDER — SYMBICORT 160-4.5 MCG/ACT IN AERO: 2.0000 | INHALATION_SPRAY | Freq: Two times a day (BID) | RESPIRATORY_TRACT | 1 refills | Status: DC

## 2022-12-27 MED ORDER — AZELASTINE HCL 0.1 % NA SOLN: 2.0000 | Freq: Two times a day (BID) | NASAL | 1 refills | Status: DC

## 2022-12-27 MED ORDER — TRIAMCINOLONE ACETONIDE 55 MCG/ACT NA AERO: 1.0000 | INHALATION_SPRAY | Freq: Every morning | NASAL | 1 refills | Status: DC

## 2022-12-27 MED ORDER — EPINEPHRINE 0.3 MG/0.3ML IJ SOAJ: INTRAMUSCULAR | 1 refills | Status: DC

## 2022-12-27 MED ORDER — MONTELUKAST SODIUM 10 MG PO TABS: 10.0000 mg | ORAL_TABLET | Freq: Every evening | ORAL | 1 refills | Status: DC

## 2022-12-27 MED ORDER — ALBUTEROL SULFATE HFA 108 (90 BASE) MCG/ACT IN AERS: 2.0000 | INHALATION_SPRAY | RESPIRATORY_TRACT | 1 refills | Status: DC | PRN

## 2022-12-27 NOTE — Progress Notes (Unsigned)
[place] - [place] - [place] - [place] - [place]   Follow-up Note  Referring Provider: Tisovec, Adelfa Koh, MD Primary Provider: Gaspar Garbe, MD Date of Office Visit: 12/27/2022  Subjective:   Chad Morrison (DOB: November 25, 1932) is a 89 y.o. male who [facility] in re-evaluation of the following:  HPI: Gene presents to this clinic in evaluation of asthma, allergic rhinitis, granulomatosis with polyangiitis, septal perforation, LPR, monoclonal gammopathy.  I last saw him in this clinic 28 June 2022.  His airway has been doing relatively well and he has had very little need to use a short acting bronchodilator while he continues on a collection of benralizumab and Symbicort and montelukast on a consistent basis.  But has had a little bit more problems with blowing out clear nasal discharge and some intermittent nasal congestion while he continues on the plan noted above and also performs nasal saline washes twice a day.  Over the course of the past month he has had lots of burping and he has had some gas.  This is definitely a little bit unusual for him and it seems to have occurred after he ate a very spicy meal at a restaurant.  He continues to use his Nexium twice a day.  He is obtained this years flu vaccine.  Allergies as of 12/27/2022       Reactions   Penicillins Hives   Has patient had a PCN reaction causing immediate rash, facial/tongue/throat swelling, SOB or lightheadedness with hypotension: No Has patient had a PCN reaction causing severe rash involving mucus membranes or skin necrosis: No Has patient had a PCN reaction that required hospitalization: No Has patient had a PCN reaction occurring within the last 10 years: Yes If all of the above answers are "NO", then may proceed with Cephalosporin use.   Latex Hives, Rash   Other Rash   Sulfa Antibiotics Nausea Only   Unknown to patient   Sulfamethoxazole-trimethoprim  Nausea Only        Medication List    albuterol 108 (90 Base) MCG/ACT inhaler Commonly known as: VENTOLIN HFA Inhale 2 puffs into the lungs every 4 (four) hours as needed for wheezing or shortness of breath.   ALPRAZolam 0.25 MG tablet Commonly known as: XANAX Take 0.125 mg by mouth at bedtime.   apixaban 5 MG Tabs tablet Commonly known as: Eliquis Take 1 tablet (5 mg total) by mouth 2 (two) times daily.   azelastine 0.1 % nasal spray Commonly known as: ASTELIN Place 2 sprays into both nostrils 2 (two) times daily.   Calcium-Vitamin D 600-125 MG-UNIT Tabs Take 1 tablet by mouth 2 (two) times daily.   diltiazem 180 MG 24 hr capsule Commonly known as: CARDIZEM CD TAKE 1 CAPSULE EVERY       MORNING AND AT BEDTIME   EPINEPHrine 0.3 mg/0.3 mL Soaj injection Commonly known as: EPI-PEN SMARTSIG:0.3 Milligram(s) IM Once   esomeprazole 40 MG capsule Commonly known as: NEXIUM Take 1 capsule (40 mg total) by mouth 2 (two) times daily.   FASENRA Vance Inject into the skin every 8 (eight) weeks.   Lasix 20 MG tablet Generic drug: furosemide Take 20 mg by mouth daily.   levocetirizine 5 MG tablet Commonly known as: XYZAL TAKE 1 TABLET EVERY EVENING   levothyroxine 88 MCG tablet Commonly known as: SYNTHROID Take 88 mcg by mouth daily before breakfast.   montelukast 10 MG tablet Commonly known as: SINGULAIR Take 1  tablet (10 mg total) by mouth at bedtime.   multivitamin with minerals Tabs tablet Take 1 tablet daily by mouth.   OCUVITE PRESERVISION PO Take 1 tablet 2 (two) times daily by mouth.   Potassium Bicarb-Citric Acid 20 MEQ Tbef Take 1 tablet by mouth daily.   rosuvastatin 5 MG tablet Commonly known as: CRESTOR TAKE 1 TABLET DAILY   SLOW FE PO Take 1 tablet by mouth every morning.   Symbicort 160-4.5 MCG/ACT inhaler Generic drug: budesonide-formoterol USE 2 INHALATIONS ORALLY INTHE MORNING AND AT BEDTIME   SYSTANE OP Place 1 drop into both eyes daily  as needed (Dry eye).   VITAMIN C PO Take 1 tablet by mouth every morning. Not sure the dosage    Past Medical History:  Diagnosis Date   Anxiety    Asthma    Asthmatic bronchitis    Atrial flutter (HCC)    chads2vasc score is 3   Ehrlichiosis 2008   GERD (gastroesophageal reflux disease)    Granulomatosis with polyangiitis (HCC)    HTN (hypertension)    Hyperlipidemia    Nuclear sclerotic cataract of left eye 06/04/2019   Cataract surgery completed March 2023, Dr. Vonna Kotyk   Paroxysmal atrial fibrillation [facility])    Prostate cancer (HCC)    Seasonal allergies     Past Surgical History:  Procedure Laterality Date   BASAL CELL CARCINOMA EXCISION  2013   EYELID   HERNIA REPAIR Bilateral 2000, 2002   KNEE ARTHROSCOPY  2000   prostectomy  1995   RADICAL   TONSILLECTOMY  1940    Review of systems negative except as noted in HPI / PMHx or noted below:  Review of Systems  Constitutional: Negative.   HENT: Negative.    Eyes: Negative.   Respiratory: Negative.    Cardiovascular: Negative.   Gastrointestinal: Negative.   Genitourinary: Negative.   Musculoskeletal: Negative.   Skin: Negative.   Neurological: Negative.   Endo/Heme/Allergies: Negative.   Psychiatric/Behavioral: Negative.       Objective:   Vitals:   12/27/22 1119  BP: 118/60  Pulse: (!) 58  Resp: 16  Temp: 98.3 F (36.8 C)  SpO2: 97%   Height: 5\' 9"  (175.3 cm)  Weight: 151 lb 14.4 oz (68.9 kg)   Physical Exam Constitutional:      Appearance: He is not diaphoretic.  HENT:     Head: Normocephalic.     Right Ear: Tympanic membrane, ear canal and external ear normal.     Left Ear: Tympanic membrane, ear canal and external ear normal.     Nose: No mucosal edema (septal perforation) or rhinorrhea.     Mouth/Throat:     Pharynx: Uvula midline. No oropharyngeal exudate.  Eyes:     Conjunctiva/sclera: Conjunctivae normal.  Neck:     Thyroid: No thyromegaly.     Trachea: Trachea normal. No tracheal  tenderness or tracheal deviation.  Cardiovascular:     Rate and Rhythm: Normal rate and regular rhythm.     Heart sounds: S1 normal and S2 normal. Murmur (systolic) heard.  Pulmonary:     Effort: No respiratory distress.     Breath sounds: Normal breath sounds. No stridor. No wheezing or rales.  Lymphadenopathy:     Head:     Right side of head: No tonsillar adenopathy.     Left side of head: No tonsillar adenopathy.     Cervical: No cervical adenopathy.  Skin:    Findings: No erythema or rash.  Nails: There is no clubbing.  Neurological:     Mental Status: He is alert.     Diagnostics: Spirometry was performed and demonstrated an FEV1 of 1.99 at 78 % of predicted.  Assessment and Plan:   1. Asthma, severe persistent, well-controlled   2. Perennial allergic rhinitis   3. Nasal septal perforation   4. LPRD (laryngopharyngeal reflux disease)   5. 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 Monoclonal gammopathy    1.  Continue to treat and prevent inflammation:   A. Symbicort 160 - 2 inhalations 2 times per day    B. Montelukast 10 mg - 1 tablet 1 time per day  C. Benralizumab injections    D. START Nasacort - 1 spray each nostril 1 time per day after nasal wash  2.  Continue to treat and prevent reflux:   A. Minimize caffeine consumption   B. Nexium 40 mg - 2 times per day  3. If needed:   A. albuterol HFA - 2 inhalations every 4-6 hours  B. Azelastine -1-2 sprays each nostril 1-2 times per day  4.  Return to clinic in 6 months or earlier if problem   5. Blood - IgA/G/M, SPEP w/ immunofixation  6. Ask primary doctor about stool H. Pylori antigen test  7. RSV vaccine???  Gene has been having a little bit more problem with his upper airway with fluid production and we will start him on Nasacort as an anti-inflammatory medication while he continues on a large collection of other anti-inflammatory medications including use of anti-IL-5  biologic agents.  His lower airway disease appears to be under very good control.  He has been having a lot of burping lately and I wonder if his reflux is not interfering with the function of his upper airway to some degree and I have asked him to minimize his caffeine consumption while he remains on Nexium twice a day.  And given the fact that he has had a lot of burping over the course of the past month along with a lot of colonic gas I did ask him to speak with his primary care doctor during his upcoming visit in a few weeks about obtaining a stool Helicobacter pylori antigen test to see if he was infected with Helicobacter pylori when this whole issue started after eating at a restaurant.  And I did make a recommendation that he consider obtaining an RSV vaccine.  We will check his blood as noted above and I will see him back in this clinic in 6 months or earlier if there is a problem.  Laurette Schimke, MD Allergy / Immunology [facility]

## 2022-12-27 NOTE — Patient Instructions (Addendum)
  1.  Continue to treat and prevent inflammation:   A. Symbicort 160 - 2 inhalations 2 times per day    B. Montelukast 10 mg - 1 tablet 1 time per day  C. Benralizumab injections    D. START Nasacort - 1 spray each nostril 1 time per day after nasal wash  2.  Continue to treat and prevent reflux:   A. Minimize caffeine consumption   B. Nexium 40 mg - 2 times per day  3. If needed:   A. albuterol HFA - 2 inhalations every 4-6 hours  B. Azelastine -1-2 sprays each nostril 1-2 times per day  4.  Return to clinic in 6 months or earlier if problem  5. Blood - IgA/G/M, SPEP w/ immunofixation  6. Ask primary doctor about stool H. Pylori antigen test  7. RSV vaccine???

## 2022-12-28 ENCOUNTER — Encounter: Payer: Self-pay | Admitting: Allergy and Immunology

## 2022-12-28 DIAGNOSIS — D8989 Other specified disorders involving the immune mechanism, not elsewhere classified: Secondary | ICD-10-CM | POA: Diagnosis not present

## 2022-12-28 DIAGNOSIS — I350 Nonrheumatic aortic (valve) stenosis: Secondary | ICD-10-CM | POA: Diagnosis not present

## 2022-12-28 DIAGNOSIS — E039 Hypothyroidism, unspecified: Secondary | ICD-10-CM | POA: Diagnosis not present

## 2022-12-28 DIAGNOSIS — J4540 Moderate persistent asthma, uncomplicated: Secondary | ICD-10-CM | POA: Diagnosis not present

## 2022-12-28 DIAGNOSIS — K2960 Other gastritis without bleeding: Secondary | ICD-10-CM | POA: Diagnosis not present

## 2022-12-28 DIAGNOSIS — M3130 Wegener's granulomatosis without renal involvement: Secondary | ICD-10-CM | POA: Diagnosis not present

## 2022-12-28 DIAGNOSIS — J849 Interstitial pulmonary disease, unspecified: Secondary | ICD-10-CM | POA: Diagnosis not present

## 2023-01-04 LAB — IGG, IGA, IGM
IgA/Immunoglobulin A, Serum: 68 mg/dL (ref 61–437)
IgG (Immunoglobin G), Serum: 466 mg/dL — ABNORMAL LOW (ref 603–1613)
IgM (Immunoglobulin M), Srm: 912 mg/dL — ABNORMAL HIGH (ref 15–143)

## 2023-01-04 LAB — PE+INTERP(RFX IFE+FLC), S
A/G Ratio: 1.5 (ref 0.7–1.7)
Albumin ELP: 3.5 g/dL (ref 2.9–4.4)
Alpha 1: 0.3 g/dL (ref 0.0–0.4)
Alpha 2: 0.6 g/dL (ref 0.4–1.0)
Beta: 0.6 g/dL — ABNORMAL LOW (ref 0.7–1.3)
Gamma Globulin: 0.9 g/dL (ref 0.4–1.8)
Globulin, Total: 2.4 g/dL (ref 2.2–3.9)
Interpretation(See Below): 0
M-Spike, %: 0.3 g/dL — ABNORMAL HIGH
Total Protein: 5.9 g/dL — ABNORMAL LOW (ref 6.0–8.5)

## 2023-01-04 LAB — IFE+KAPPA/LAMBDA QN S
Ig Kappa Free Light Chain: 35.9 mg/L — ABNORMAL HIGH (ref 3.3–19.4)
Ig Lambda Free Light Chain: 50.1 mg/L — ABNORMAL HIGH (ref 5.7–26.3)
KAPPA/LAMBDA RATIO: 0.72 (ref 0.26–1.65)

## 2023-01-05 ENCOUNTER — Other Ambulatory Visit (HOSPITAL_COMMUNITY): Payer: Self-pay | Admitting: Physician Assistant

## 2023-01-05 DIAGNOSIS — H35351 Cystoid macular degeneration, right eye: Secondary | ICD-10-CM | POA: Diagnosis not present

## 2023-01-05 DIAGNOSIS — H30041 Focal chorioretinal inflammation, macular or paramacular, right eye: Secondary | ICD-10-CM | POA: Diagnosis not present

## 2023-01-05 DIAGNOSIS — H353212 Exudative age-related macular degeneration, right eye, with inactive choroidal neovascularization: Secondary | ICD-10-CM | POA: Diagnosis not present

## 2023-01-05 DIAGNOSIS — H4061X2 Glaucoma secondary to drugs, right eye, moderate stage: Secondary | ICD-10-CM | POA: Diagnosis not present

## 2023-01-05 DIAGNOSIS — H35371 Puckering of macula, right eye: Secondary | ICD-10-CM | POA: Diagnosis not present

## 2023-01-05 DIAGNOSIS — H353114 Nonexudative age-related macular degeneration, right eye, advanced atrophic with subfoveal involvement: Secondary | ICD-10-CM | POA: Diagnosis not present

## 2023-01-05 DIAGNOSIS — I4821 Permanent atrial fibrillation: Secondary | ICD-10-CM

## 2023-01-05 DIAGNOSIS — H353122 Nonexudative age-related macular degeneration, left eye, intermediate dry stage: Secondary | ICD-10-CM | POA: Diagnosis not present

## 2023-01-05 DIAGNOSIS — H04122 Dry eye syndrome of left lacrimal gland: Secondary | ICD-10-CM | POA: Diagnosis not present

## 2023-01-05 DIAGNOSIS — H4041X0 Glaucoma secondary to eye inflammation, right eye, stage unspecified: Secondary | ICD-10-CM | POA: Diagnosis not present

## 2023-01-06 NOTE — Telephone Encounter (Signed)
Eliquis 5mg  refill request received. Patient is 89 years old, weight-68.9kg, Crea-0.91 on 06/01/22, Diagnosis-Afib, and last seen by Dr. Lynnette Caffey on 10/24/22. Dose is appropriate based on dosing criteria. Will send in refill to requested pharmacy.

## 2023-01-09 DIAGNOSIS — E039 Hypothyroidism, unspecified: Secondary | ICD-10-CM | POA: Diagnosis not present

## 2023-01-09 DIAGNOSIS — R252 Cramp and spasm: Secondary | ICD-10-CM | POA: Diagnosis not present

## 2023-01-10 DIAGNOSIS — I350 Nonrheumatic aortic (valve) stenosis: Secondary | ICD-10-CM | POA: Diagnosis not present

## 2023-01-10 DIAGNOSIS — Z7901 Long term (current) use of anticoagulants: Secondary | ICD-10-CM | POA: Diagnosis not present

## 2023-01-10 DIAGNOSIS — E871 Hypo-osmolality and hyponatremia: Secondary | ICD-10-CM | POA: Diagnosis not present

## 2023-01-10 DIAGNOSIS — I4892 Unspecified atrial flutter: Secondary | ICD-10-CM | POA: Diagnosis not present

## 2023-01-10 DIAGNOSIS — J849 Interstitial pulmonary disease, unspecified: Secondary | ICD-10-CM | POA: Diagnosis not present

## 2023-01-10 DIAGNOSIS — M3130 Wegener's granulomatosis without renal involvement: Secondary | ICD-10-CM | POA: Diagnosis not present

## 2023-01-13 DIAGNOSIS — Z7901 Long term (current) use of anticoagulants: Secondary | ICD-10-CM | POA: Diagnosis not present

## 2023-01-13 DIAGNOSIS — K2960 Other gastritis without bleeding: Secondary | ICD-10-CM | POA: Diagnosis not present

## 2023-01-13 DIAGNOSIS — E871 Hypo-osmolality and hyponatremia: Secondary | ICD-10-CM | POA: Diagnosis not present

## 2023-01-13 DIAGNOSIS — M3130 Wegener's granulomatosis without renal involvement: Secondary | ICD-10-CM | POA: Diagnosis not present

## 2023-01-13 DIAGNOSIS — J849 Interstitial pulmonary disease, unspecified: Secondary | ICD-10-CM | POA: Diagnosis not present

## 2023-01-13 DIAGNOSIS — I350 Nonrheumatic aortic (valve) stenosis: Secondary | ICD-10-CM | POA: Diagnosis not present

## 2023-01-13 DIAGNOSIS — I4892 Unspecified atrial flutter: Secondary | ICD-10-CM | POA: Diagnosis not present

## 2023-01-17 DIAGNOSIS — C44319 Basal cell carcinoma of skin of other parts of face: Secondary | ICD-10-CM | POA: Diagnosis not present

## 2023-01-18 ENCOUNTER — Ambulatory Visit (INDEPENDENT_AMBULATORY_CARE_PROVIDER_SITE_OTHER): Payer: Medicare Other | Admitting: *Deleted

## 2023-01-18 DIAGNOSIS — J4550 Severe persistent asthma, uncomplicated: Secondary | ICD-10-CM

## 2023-01-20 DIAGNOSIS — I350 Nonrheumatic aortic (valve) stenosis: Secondary | ICD-10-CM | POA: Diagnosis not present

## 2023-01-20 DIAGNOSIS — J849 Interstitial pulmonary disease, unspecified: Secondary | ICD-10-CM | POA: Diagnosis not present

## 2023-01-20 DIAGNOSIS — K2960 Other gastritis without bleeding: Secondary | ICD-10-CM | POA: Diagnosis not present

## 2023-01-20 DIAGNOSIS — M3130 Wegener's granulomatosis without renal involvement: Secondary | ICD-10-CM | POA: Diagnosis not present

## 2023-01-20 DIAGNOSIS — E871 Hypo-osmolality and hyponatremia: Secondary | ICD-10-CM | POA: Diagnosis not present

## 2023-01-20 DIAGNOSIS — Z7901 Long term (current) use of anticoagulants: Secondary | ICD-10-CM | POA: Diagnosis not present

## 2023-01-20 DIAGNOSIS — I4892 Unspecified atrial flutter: Secondary | ICD-10-CM | POA: Diagnosis not present

## 2023-01-20 NOTE — Addendum Note (Signed)
Addended by: Rolland Bimler D on: 01/20/2023 04:18 PM   Modules accepted: Orders

## 2023-01-20 NOTE — Addendum Note (Signed)
Addended by: Rolland Bimler D on: 01/20/2023 04:21 PM   Modules accepted: Orders

## 2023-01-23 ENCOUNTER — Telehealth: Payer: Self-pay

## 2023-01-23 DIAGNOSIS — D801 Nonfamilial hypogammaglobulinemia: Secondary | ICD-10-CM

## 2023-01-23 NOTE — Telephone Encounter (Signed)
Spoke with our lab technician about the additional lab that needed to be drawn. She states that the patient has to go to the [facility] to get the Memory B Cell test done due to the requirements for the test. Called patient and advised him of this and mailed out the lab order and Patient [facility] Center Locations. Patient stated that he understood.

## 2023-02-02 LAB — MEMORY B CELLS

## 2023-02-03 DIAGNOSIS — J849 Interstitial pulmonary disease, unspecified: Secondary | ICD-10-CM | POA: Diagnosis not present

## 2023-02-03 DIAGNOSIS — E871 Hypo-osmolality and hyponatremia: Secondary | ICD-10-CM | POA: Diagnosis not present

## 2023-02-03 DIAGNOSIS — I4892 Unspecified atrial flutter: Secondary | ICD-10-CM | POA: Diagnosis not present

## 2023-02-03 DIAGNOSIS — I350 Nonrheumatic aortic (valve) stenosis: Secondary | ICD-10-CM | POA: Diagnosis not present

## 2023-02-03 DIAGNOSIS — K2960 Other gastritis without bleeding: Secondary | ICD-10-CM | POA: Diagnosis not present

## 2023-02-03 DIAGNOSIS — M3130 Wegener's granulomatosis without renal involvement: Secondary | ICD-10-CM | POA: Diagnosis not present

## 2023-02-03 DIAGNOSIS — Z7901 Long term (current) use of anticoagulants: Secondary | ICD-10-CM | POA: Diagnosis not present

## 2023-02-08 DIAGNOSIS — H0015 Chalazion left lower eyelid: Secondary | ICD-10-CM | POA: Diagnosis not present

## 2023-02-08 NOTE — Telephone Encounter (Addendum)
Spoke to Ethel and she looked into the reason why this test was not preferred the first time. The third party testing site cancelled the testing due to the sample not getting there in a timely manner and some other reason they did not specify. (This was noted in another encounter, but at present I can not find the thread.)   Called patient and let me know that he will have to return to [facility] location to get this redone. Patient stated that he understood. Test has been reordered.

## 2023-02-08 NOTE — Addendum Note (Signed)
Addended by: Rolland Bimler D on: 02/08/2023 05:15 PM   Modules accepted: Orders

## 2023-02-09 DIAGNOSIS — H353114 Nonexudative age-related macular degeneration, right eye, advanced atrophic with subfoveal involvement: Secondary | ICD-10-CM | POA: Diagnosis not present

## 2023-02-09 DIAGNOSIS — H04122 Dry eye syndrome of left lacrimal gland: Secondary | ICD-10-CM | POA: Diagnosis not present

## 2023-02-09 DIAGNOSIS — H353122 Nonexudative age-related macular degeneration, left eye, intermediate dry stage: Secondary | ICD-10-CM | POA: Diagnosis not present

## 2023-02-09 DIAGNOSIS — H35351 Cystoid macular degeneration, right eye: Secondary | ICD-10-CM | POA: Diagnosis not present

## 2023-02-09 DIAGNOSIS — H35371 Puckering of macula, right eye: Secondary | ICD-10-CM | POA: Diagnosis not present

## 2023-02-09 DIAGNOSIS — H30041 Focal chorioretinal inflammation, macular or paramacular, right eye: Secondary | ICD-10-CM | POA: Diagnosis not present

## 2023-02-09 DIAGNOSIS — H4061X2 Glaucoma secondary to drugs, right eye, moderate stage: Secondary | ICD-10-CM | POA: Diagnosis not present

## 2023-02-09 DIAGNOSIS — H353212 Exudative age-related macular degeneration, right eye, with inactive choroidal neovascularization: Secondary | ICD-10-CM | POA: Diagnosis not present

## 2023-02-09 DIAGNOSIS — H4041X0 Glaucoma secondary to eye inflammation, right eye, stage unspecified: Secondary | ICD-10-CM | POA: Diagnosis not present

## 2023-03-02 DIAGNOSIS — I4892 Unspecified atrial flutter: Secondary | ICD-10-CM | POA: Diagnosis not present

## 2023-03-02 DIAGNOSIS — Z7901 Long term (current) use of anticoagulants: Secondary | ICD-10-CM | POA: Diagnosis not present

## 2023-03-02 DIAGNOSIS — I350 Nonrheumatic aortic (valve) stenosis: Secondary | ICD-10-CM | POA: Diagnosis not present

## 2023-03-02 DIAGNOSIS — D8989 Other specified disorders involving the immune mechanism, not elsewhere classified: Secondary | ICD-10-CM | POA: Diagnosis not present

## 2023-03-02 DIAGNOSIS — M3130 Wegener's granulomatosis without renal involvement: Secondary | ICD-10-CM | POA: Diagnosis not present

## 2023-03-02 DIAGNOSIS — Z8546 Personal history of malignant neoplasm of prostate: Secondary | ICD-10-CM | POA: Diagnosis not present

## 2023-03-02 DIAGNOSIS — K2960 Other gastritis without bleeding: Secondary | ICD-10-CM | POA: Diagnosis not present

## 2023-03-02 DIAGNOSIS — J849 Interstitial pulmonary disease, unspecified: Secondary | ICD-10-CM | POA: Diagnosis not present

## 2023-03-02 DIAGNOSIS — M8580 Other specified disorders of bone density and structure, unspecified site: Secondary | ICD-10-CM | POA: Diagnosis not present

## 2023-03-02 DIAGNOSIS — I700 Atherosclerosis of aorta: Secondary | ICD-10-CM | POA: Diagnosis not present

## 2023-03-02 DIAGNOSIS — J4540 Moderate persistent asthma, uncomplicated: Secondary | ICD-10-CM | POA: Diagnosis not present

## 2023-03-02 DIAGNOSIS — E871 Hypo-osmolality and hyponatremia: Secondary | ICD-10-CM | POA: Diagnosis not present

## 2023-03-09 DIAGNOSIS — D485 Neoplasm of uncertain behavior of skin: Secondary | ICD-10-CM | POA: Diagnosis not present

## 2023-03-09 DIAGNOSIS — Z85820 Personal history of malignant melanoma of skin: Secondary | ICD-10-CM | POA: Diagnosis not present

## 2023-03-09 DIAGNOSIS — D225 Melanocytic nevi of trunk: Secondary | ICD-10-CM | POA: Diagnosis not present

## 2023-03-09 DIAGNOSIS — L814 Other melanin hyperpigmentation: Secondary | ICD-10-CM | POA: Diagnosis not present

## 2023-03-09 DIAGNOSIS — Z08 Encounter for follow-up examination after completed treatment for malignant neoplasm: Secondary | ICD-10-CM | POA: Diagnosis not present

## 2023-03-09 DIAGNOSIS — C44612 Basal cell carcinoma of skin of right upper limb, including shoulder: Secondary | ICD-10-CM | POA: Diagnosis not present

## 2023-03-09 DIAGNOSIS — C44212 Basal cell carcinoma of skin of right ear and external auricular canal: Secondary | ICD-10-CM | POA: Diagnosis not present

## 2023-03-09 DIAGNOSIS — C44519 Basal cell carcinoma of skin of other part of trunk: Secondary | ICD-10-CM | POA: Diagnosis not present

## 2023-03-09 DIAGNOSIS — L821 Other seborrheic keratosis: Secondary | ICD-10-CM | POA: Diagnosis not present

## 2023-03-09 DIAGNOSIS — C44319 Basal cell carcinoma of skin of other parts of face: Secondary | ICD-10-CM | POA: Diagnosis not present

## 2023-03-09 DIAGNOSIS — Z85828 Personal history of other malignant neoplasm of skin: Secondary | ICD-10-CM | POA: Diagnosis not present

## 2023-03-09 DIAGNOSIS — L570 Actinic keratosis: Secondary | ICD-10-CM | POA: Diagnosis not present

## 2023-03-14 ENCOUNTER — Ambulatory Visit (INDEPENDENT_AMBULATORY_CARE_PROVIDER_SITE_OTHER): Payer: Medicare Other | Admitting: *Deleted

## 2023-03-14 DIAGNOSIS — J4550 Severe persistent asthma, uncomplicated: Secondary | ICD-10-CM

## 2023-03-14 DIAGNOSIS — D801 Nonfamilial hypogammaglobulinemia: Secondary | ICD-10-CM | POA: Diagnosis not present

## 2023-03-15 ENCOUNTER — Other Ambulatory Visit (HOSPITAL_COMMUNITY): Payer: Self-pay | Admitting: Physician Assistant

## 2023-03-17 LAB — MEMORY B CELLS
CD27+/IgD+ (Unswitched): 10.5 %
CD27+/IgD- (Switched): 83 %
CD27-/IgD+ (Naive): 4.9 %

## 2023-03-31 ENCOUNTER — Encounter: Payer: Self-pay | Admitting: Internal Medicine

## 2023-04-13 DIAGNOSIS — H353114 Nonexudative age-related macular degeneration, right eye, advanced atrophic with subfoveal involvement: Secondary | ICD-10-CM | POA: Diagnosis not present

## 2023-04-13 DIAGNOSIS — H30041 Focal chorioretinal inflammation, macular or paramacular, right eye: Secondary | ICD-10-CM | POA: Diagnosis not present

## 2023-04-13 DIAGNOSIS — H4061X2 Glaucoma secondary to drugs, right eye, moderate stage: Secondary | ICD-10-CM | POA: Diagnosis not present

## 2023-04-13 DIAGNOSIS — H353211 Exudative age-related macular degeneration, right eye, with active choroidal neovascularization: Secondary | ICD-10-CM | POA: Diagnosis not present

## 2023-04-13 DIAGNOSIS — H4041X0 Glaucoma secondary to eye inflammation, right eye, stage unspecified: Secondary | ICD-10-CM | POA: Diagnosis not present

## 2023-04-13 DIAGNOSIS — H353122 Nonexudative age-related macular degeneration, left eye, intermediate dry stage: Secondary | ICD-10-CM | POA: Diagnosis not present

## 2023-04-13 DIAGNOSIS — H35351 Cystoid macular degeneration, right eye: Secondary | ICD-10-CM | POA: Diagnosis not present

## 2023-04-13 DIAGNOSIS — H353212 Exudative age-related macular degeneration, right eye, with inactive choroidal neovascularization: Secondary | ICD-10-CM | POA: Diagnosis not present

## 2023-04-13 DIAGNOSIS — H04122 Dry eye syndrome of left lacrimal gland: Secondary | ICD-10-CM | POA: Diagnosis not present

## 2023-04-13 DIAGNOSIS — H35371 Puckering of macula, right eye: Secondary | ICD-10-CM | POA: Diagnosis not present

## 2023-04-18 DIAGNOSIS — C4441 Basal cell carcinoma of skin of scalp and neck: Secondary | ICD-10-CM | POA: Diagnosis not present

## 2023-04-25 ENCOUNTER — Other Ambulatory Visit (HOSPITAL_COMMUNITY): Payer: Self-pay | Admitting: Internal Medicine

## 2023-04-27 NOTE — Progress Notes (Signed)
 Patient ID: Chad Morrison MRN: [id number] DOB/AGE: 04-Jul-1932 65 y.o.  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 male with the indicated medical history here for recommendations regarding his severe aortic stenosis seen on echocardiogram.  The patient was referred by nurse practitioner Caroll in the atrial fibrillation clinic the patient was seen in April of this year and was doing relatively well.   The patient is here with his wife.  He is relatively active.  He exercises at the [facility] on a regular basis using a stationary bike.  He denies any exertional dyspnea, exertional angina, presyncope or syncope.  His atrial fibrillation has not really bother him whatsoever.  He has not required emergency room visits or hospitalizations recently for any breathing difficulties.  He has had no issues with bleeding or bruising while on Eliquis.  In terms of other symptoms his Wegener's granulomatosis is very well controlled on immunotherapy.   He is been retired for a long time.  He moved to [place] [employer] some years ago because his son is here.  He used to live in [place] since the 90s.  He does not smoke or drink.  He sees a Education officer, community on a regular basis and reports very good dental health.  Plan:  Follow up in 3 months.  August 2023:  The patient returns for 84-month follow-up.  The patient was seen in April regarding his aortic valvular disease.  At that point in time he was he was doing relatively well and  was asymptomatic with regards to his aortic stenosis.  The patient is here with his wife.  He continues to be very active.  He walks [employer] park on a regular basis.  When he goes up hills he might have to stop but this is relatively unchanged versus prior.  He has required no emergency room visits or hospitalizations.  He denies any exertional angina, presyncope or syncope.  His biggest issue recently was left eye pain and this is being actively managed by ophthalmology.  Plan:  Monitor for now, follow up 3 months.  February 2024: In the interim since I saw him last an echocardiogram was done which demonstrated progression to severe aortic stenosis with a preserved ejection fraction.  He was recently admitted to the hospital for probable pneumonia and treated with antibiotics.  He was in the hospital for 1 night.  He is about 12 days out from his hospitalization.  He is feeling better but not back up to speed.  He is still able to walk [place] and do exercises at the [facility] with only slight shortness of breath.  He does not believe that this is too limiting.  He denies any presyncope, syncope, or chest pain.  Plan: 58-month follow-up.  August 2024: The patient was admitted to the hospital with lower extremity edema and found to be profoundly hyponatremic.  His hyponatremia was thought to be due to inadequately treated hypothyroidism.  He was administered IV levothyroxine with improvement in his edema and sodium levels.  He was seen in  cardiology clinic in April and was improved however he did note some shortness of breath with activity.  2+ peripheral edema was present.  His Lasix was increased to 40 mg twice a day for 2 days.  He was seen in atrial fibrillation clinic and again in general cardiology clinic after that and was feeling well without any reports of dyspnea or exertional angina.  He continues to do well.  Because he had been on prednisone long-term he does have lower extremity myopathy that  limits his ambulation.  However he never gets short of breath when he has to stop and take a rest because of his leg fatigue.  He denies any chest pain, presyncope, or syncope.  He is walking about a mile or 2 every day.  [facility].  He is getting stronger since being discharged from the hospital.  He does see a dentist on a regular basis and reports good dental health.  Plan: Follow-up in 6 months.  March 2025:  Patient consents to use of AI scribe.  The patient returns for routine follow-up.  His aortic valve stenosis has worsened slightly, but his heart function remains normal. No chest discomfort, chest tightness, lightheadedness, blacking out spells, or dizziness during physical activity. He maintains his usual level of physical activity, walking a mile twice a week with his dog, though he sometimes needs to stop and rest due to leg fatigue rather than dyspnea. His walking ability is unchanged from a year ago.  He has a history of atrial fibrillation and a right bundle branch block. He has not experienced any strokes. He is aware of the potential need for a pacemaker in the future due to his cardiac condition.  He underwent cataract surgery on his right eye, which resulted in complications. The cataract splintered during removal, causing ongoing irritation despite corrective measures for the past two to three years.  Past Medical History:  Diagnosis Date   Anxiety    Asthma    Asthmatic bronchitis    Atrial flutter (HCC)    chads2vasc score is 3   Ehrlichiosis 2008   GERD (gastroesophageal reflux disease)    Granulomatosis with polyangiitis (HCC)    HTN (hypertension)    Hyperlipidemia    Nuclear sclerotic cataract of left eye 06/04/2019   Cataract surgery completed March 2023, Dr. Vonna Kotyk   Paroxysmal atrial fibrillation [facility])    Prostate cancer (HCC)    Seasonal allergies     Past Surgical History:  Procedure Laterality Date   BASAL CELL CARCINOMA EXCISION   2013   EYELID   HERNIA REPAIR Bilateral 2000, 2002   KNEE ARTHROSCOPY  2000   prostectomy  1995   RADICAL   TONSILLECTOMY  1940    Family History  Problem Relation Age of Onset   Asthma Mother 54   Allergies Mother    Ulcers Mother    CVA Father 58   Heart Problems Father    Other Father        NEUROLOGIC ISSUES    Other Brother 80       BICYCLE ACCIDENT   Migraines Son    Diabetes Mellitus I Son     Social History   Socioeconomic History   Marital status: Married    Spouse name: [name]   Number of children: 2   Years of education: Not on file   Highest education level: Not on file  Occupational History   Occupation: RETIRED  Tobacco  Use   Smoking status: Former    Current packs/day: 0.00    Average packs/day: 0.5 packs/day for 4.0 years (2.0 ttl pk-yrs)    Types: Cigarettes    Start date: 02/28/1957    Quit date: 02/28/1961    Years since quitting: 62.2   Smokeless tobacco: Never   Tobacco comments:    Former smoker 06/20/22  Vaping Use   Vaping status: Never Used  Substance and Sexual Activity   Alcohol use: Yes    Alcohol/week: 2.0 - 3.0 standard drinks of alcohol    Types: 2 - 3 Standard drinks or equivalent per week   Drug use: No   Sexual activity: Not on file  Other Topics Concern   Not on file  Social History Narrative   Pt recently moved to [place] from [place] to be near his son.   Retired.   Social Drivers of Corporate investment banker Strain: Not on file  Food Insecurity: No Food Insecurity (05/31/2022)   Hunger Vital Sign    Worried About Running Out of Food in the Last Year: Never true    Ran Out of Food in the Last Year: Never true  Transportation Needs: No Transportation Needs (05/31/2022)   PRAPARE - Administrator, Civil Service (Medical): No    Lack of Transportation (Non-Medical): No  Physical Activity: Not on file  Stress: Not on file  Social Connections: Not on file  Intimate Partner Violence: Not At Risk  (05/31/2022)   Humiliation, Afraid, Rape, and Kick questionnaire    Fear of Current or Ex-Partner: No    Emotionally Abused: No    Physically Abused: No    Sexually Abused: No     Prior to Admission medications   Medication Sig Start Date End Date Taking? Authorizing Provider  albuterol (VENTOLIN HFA) 108 (90 Base) MCG/ACT inhaler Inhale 2 puffs into the lungs every 4 (four) hours as needed for wheezing or shortness of breath. 06/22/21   Kozlow, Alvira Philips, MD  ALPRAZolam Prudy Feeler) 0.25 MG tablet Take 0.25 mg by mouth at bedtime as needed for sleep.     [provider]  Ascorbic Acid (VITAMIN C PO) Take 1 tablet by mouth every morning. Not sure the dosage    [provider]  azelastine (ASTELIN) 0.1 % nasal spray Place 1 spray into both nostrils 2 (two) times daily. 03/31/21   [provider]  budesonide-formoterol (SYMBICORT) 160-4.5 MCG/ACT inhaler Inhale 2 puffs into the lungs in the morning and at bedtime. 06/22/21   Kozlow, Alvira Philips, MD  Calcium Carbonate-Vitamin D (CALCIUM-VITAMIN D) 600-125 MG-UNIT TABS Take 1 tablet by mouth 2 (two) times daily.     [provider]  diltiazem (CARDIZEM CD) 180 MG 24 hr capsule TAKE 1 CAPSULE EVERY       MORNING AND AT BEDTIME 02/23/21   Fenton, Clint R, PA  ELIQUIS 5 MG TABS tablet TAKE 1 TABLET TWICE A DAY 02/23/21   Fenton, Clint R, PA  EPINEPHrine 0.3 mg/0.3 mL IJ SOAJ injection SMARTSIG:0.3 Milligram(s) IM Once 06/22/21   Kozlow, Alvira Philips, MD  esomeprazole (NEXIUM) 40 MG capsule Take 1 capsule (40 mg total) by mouth 2 (two) times daily. 06/22/21   Kozlow, Alvira Philips, MD  Ferrous Sulfate (SLOW FE PO) Take 1 tablet by mouth every morning.    [provider]  fluorouracil (EFUDEX) 5 % cream SMARTSIG:sparingly Topical Twice Daily 05/18/21   [provider]  levocetirizine (XYZAL) 5 MG tablet Take  1 tablet (5 mg total) by mouth 2 (two) times daily as needed for allergies (Can take an extra dose during flare ups.). 06/22/21    Kozlow, Alvira Philips, MD  levothyroxine (SYNTHROID, LEVOTHROID) 75 MCG tablet Take 75 mcg by mouth daily before breakfast.    [provider]  montelukast (SINGULAIR) 10 MG tablet Take 1 tablet (10 mg total) by mouth at bedtime. 06/22/21   Kozlow, Alvira Philips, MD  Multiple Vitamin (MULTIVITAMIN WITH MINERALS) TABS tablet Take 1 tablet daily by mouth.    [provider]  Multiple Vitamins-Minerals (OCUVITE PRESERVISION PO) Take 1 tablet 2 (two) times daily by mouth.    [provider]  Polyethyl Glycol-Propyl Glycol (SYSTANE OP) Apply 1 drop to eye every morning.    [provider]  PROLENSA 0.07 % SOLN INSTILL 1 DROP INTO THE    RIGHT EYE DAILY 07/19/21   Rankin, Alford Highland, MD  Propylene Glycol 0.6 % SOLN Apply 1 drop to eye in the morning.    [provider]  rosuvastatin (CRESTOR) 5 MG tablet TAKE 1 TABLET DAILY 01/19/21   Fenton, Clint R, PA    Allergies  Allergen Reactions   Penicillins Hives    Has patient had a PCN reaction causing immediate rash, facial/tongue/throat swelling, SOB or lightheadedness with hypotension: No Has patient had a PCN reaction causing severe rash involving mucus membranes or skin necrosis: No Has patient had a PCN reaction that required hospitalization: No Has patient had a PCN reaction occurring within the last 10 years: Yes If all of the above answers are "NO", then may proceed with Cephalosporin use.    Latex Hives and Rash   Other Rash   Sulfa Antibiotics Nausea Only    Unknown to patient    Sulfamethoxazole-Trimethoprim Nausea Only    REVIEW OF SYSTEMS:  General: no fevers/chills/night sweats Eyes: no blurry vision, diplopia, or amaurosis ENT: no sore throat or hearing loss Resp: no cough, wheezing, or hemoptysis CV: no edema or palpitations GI: no abdominal pain, nausea, vomiting, diarrhea, or constipation GU: no dysuria, frequency, or hematuria Skin: no rash Neuro: no headache, numbness, tingling, or weakness of  extremities Musculoskeletal: no joint pain or swelling Heme: no bleeding, DVT, or easy bruising Endo: no polydipsia or polyuria  BP 116/62   Pulse 68   Ht 5\' 9"  (1.753 m)   Wt 139 lb 12.8 oz (63.4 kg)   SpO2 96%   BMI 20.64 kg/m   PHYSICAL EXAM: GEN:  AO x 3 in no acute distress HEENT: normal Dentition: Normal Neck: JVP normal. +2 carotid upstrokes without bruits. No thyromegaly. Lungs: equal expansion, clear bilaterally CV: Apex is discrete and nondisplaced, RRR with 3/6 SEM Abd: soft, non-tender, non-distended; no bruit; positive bowel sounds Ext: no edema, ecchymoses, or cyanosis Vascular: 2+ femoral pulses, 2+ radial pulses       Skin: warm and dry without rash Neuro: CN II-XII grossly intact; motor and sensory grossly intact    DATA AND STUDIES:  EKG: Atrial fibrillation with a right bundle branch block  2D ECHO: 2024 1. Left ventricular ejection fraction, by estimation, is 55 to 60%. The  left ventricle has normal function. The left ventricle has no regional  wall motion abnormalities. There is mild concentric left ventricular  hypertrophy. Left ventricular diastolic  parameters are indeterminate.   2. Right ventricular systolic function is normal. The right ventricular  size is normal. Tricuspid regurgitation signal is inadequate for assessing  PA pressure.   3.  Left atrial size was moderately dilated.   4. Right atrial size was moderately dilated.   5. The mitral valve is normal in structure. Mild mitral valve  regurgitation. No evidence of mitral stenosis.   6. The aortic valve is tricuspid. There is severe calcifcation of the  aortic valve. Aortic valve regurgitation is trivial. Severe aortic valve  stenosis. Aortic valve area, by VTI measures 0.68 cm. Aortic valve mean  gradient measures 42.5 mmHg.   7. Aortic dilatation noted. There is mild dilatation of the ascending  aorta, measuring 39 mm.   8. The inferior vena cava is normal in size with greater  than 50%  respiratory variability, suggesting right atrial pressure of 3 mmHg.   9. The patient was in atrial fibrillation.   CARDIAC CATH: n/a  STS RISK CALCULATOR: pending  NHYA CLASS: 1    ASSESSMENT AND PLAN:   Severe aortic stenosis - Plan: EKG 12-Lead, ECHOCARDIOGRAM COMPLETE  Permanent atrial fibrillation (HCC) - Plan: EKG 12-Lead  Secondary hypercoagulable state (HCC) - Plan: EKG 12-Lead  Essential hypertension - Plan: EKG 12-Lead  Hyperlipidemia LDL goal <70 - Plan: EKG 12-Lead  Aortic atherosclerosis (HCC) - Plan: EKG 12-Lead  CKD (chronic kidney disease) stage 2, GFR 60-89 ml/min - Plan: EKG 12-Lead  Aortic stenosis: The patient remains relatively asymptomatic with no real increasing shortness of breath, fatigue, presyncope, syncope, or decrement in his ejection fraction.  I think most of this issue is likely due to the fact he is somewhat limited in regards to his activity level due to bilateral hammertoes.  I will see him back in 6 months with another echocardiogram.  The patient knows to contact our office for any increasing shortness of breath, presyncope, chest discomfort, or fatigue. Permanent atrial fibrillation: Continue Eliquis 5 mg twice daily and Cardizem 180 mg daily. Secondary hypercoagulable state: Continue Eliquis 5 mg twice daily. Hypertension: Blood pressure is well-controlled today. Hyperlipidemia: Continue Crestor 5 mg daily.  Lipids followed by PCP. Aortic atherosclerosis: Continue Eliquis 5 mg twice daily instead of aspirin and Crestor 5 mg daily. Stage II: Monitor for now.  I spent 35 minutes reviewing all clinical data during and prior to this visit including all relevant imaging studies, laboratories, clinical information from other health systems and prior notes from both Cardiology and other specialties, interviewing the patient, conducting a complete physical examination, and coordinating care in order to formulate a comprehensive and  personalized evaluation and treatment plan.   Orbie Pyo, MD  05/04/2023 12:26 PM    [facility] [address] [place], [place], [place]  [address] Phone: [phone]; Fax: [phone]

## 2023-05-01 ENCOUNTER — Encounter: Payer: Self-pay | Admitting: Internal Medicine

## 2023-05-01 ENCOUNTER — Other Ambulatory Visit (HOSPITAL_COMMUNITY): Payer: Medicare Other

## 2023-05-01 ENCOUNTER — Ambulatory Visit (HOSPITAL_COMMUNITY): Payer: Medicare Other | Attending: Internal Medicine

## 2023-05-01 DIAGNOSIS — I350 Nonrheumatic aortic (valve) stenosis: Secondary | ICD-10-CM

## 2023-05-01 LAB — ECHOCARDIOGRAM COMPLETE
AR max vel: 0.57 cm2
AV Area VTI: 0.56 cm2
AV Area mean vel: 0.6 cm2
AV Mean grad: 37.3 mmHg
AV Peak grad: 67.2 mmHg
Ao pk vel: 4.1 m/s
P 1/2 time: 546 ms
S' Lateral: 3.2 cm

## 2023-05-02 ENCOUNTER — Other Ambulatory Visit (HOSPITAL_COMMUNITY): Payer: Medicare Other

## 2023-05-04 ENCOUNTER — Ambulatory Visit: Payer: Medicare Other | Attending: Internal Medicine | Admitting: Internal Medicine

## 2023-05-04 ENCOUNTER — Encounter: Payer: Self-pay | Admitting: Internal Medicine

## 2023-05-04 VITALS — BP 116/62 | HR 68 | Ht 69.0 in | Wt 139.8 lb

## 2023-05-04 DIAGNOSIS — D6869 Other thrombophilia: Secondary | ICD-10-CM

## 2023-05-04 DIAGNOSIS — E785 Hyperlipidemia, unspecified: Secondary | ICD-10-CM

## 2023-05-04 DIAGNOSIS — I700 Atherosclerosis of aorta: Secondary | ICD-10-CM | POA: Diagnosis not present

## 2023-05-04 DIAGNOSIS — I350 Nonrheumatic aortic (valve) stenosis: Secondary | ICD-10-CM | POA: Diagnosis not present

## 2023-05-04 DIAGNOSIS — N182 Chronic kidney disease, stage 2 (mild): Secondary | ICD-10-CM

## 2023-05-04 DIAGNOSIS — I4821 Permanent atrial fibrillation: Secondary | ICD-10-CM | POA: Diagnosis not present

## 2023-05-04 DIAGNOSIS — I10 Essential (primary) hypertension: Secondary | ICD-10-CM

## 2023-05-04 NOTE — Patient Instructions (Addendum)
 Medication Instructions:  Your physician recommends that you continue on your current medications as directed. Please refer to the Current Medication list given to you today.  *If you need a refill on your cardiac medications before your next appointment, please call your pharmacy*  Testing/Procedures: Your physician has requested that you have an echocardiogram in 6 months (1-2 weeks prior to office visit, same day OK). Echocardiography is a painless test that uses sound waves to create images of your heart. It provides your doctor with information about the size and shape of your heart and how well your heart's chambers and valves are working. This procedure takes approximately one hour. There are no restrictions for this procedure. Please do NOT wear cologne, perfume, aftershave, or lotions (deodorant is allowed). Please arrive 15 minutes prior to your appointment time.  Please note: We ask at that you not bring children with you during ultrasound (echo/ vascular) testing. Due to room size and safety concerns, children are not allowed in the ultrasound rooms during exams. Our front office staff cannot provide observation of children in our lobby area while testing is being conducted. An adult accompanying a patient to their appointment will only be allowed in the ultrasound room at the discretion of the ultrasound technician under special circumstances. We apologize for any inconvenience.  Follow-Up: At [facility], you and your health needs are our priority.  As part of our continuing mission to provide you with exceptional heart care, we have created designated Provider Care Teams.  These Care Teams include your primary Cardiologist (physician) and Advanced Practice Providers (APPs -  Physician Assistants and Nurse Practitioners) who all work together to provide you with the care you need, when you need it.  Your next appointment:   6 month(s)  The format for your next appointment:   In  Person  Provider:   Orbie Pyo, MD {  Other Instructions   1st Floor: - Lobby - Registration  - Pharmacy  - Lab - Cafe  2nd Floor: - PV Lab - Diagnostic Testing (echo, CT, nuclear med)  3rd Floor: - Vacant  4th Floor: - TCTS (cardiothoracic surgery) - AFib Clinic - Structural Heart Clinic - Vascular Surgery  - Vascular Ultrasound  5th Floor: - HeartCare Cardiology (general and EP) - Clinical Pharmacy for coumadin, hypertension, lipid, weight-loss medications, and med management appointments    Valet parking services will be available as well.

## 2023-05-08 ENCOUNTER — Ambulatory Visit: Payer: Medicare Other

## 2023-05-08 DIAGNOSIS — J4550 Severe persistent asthma, uncomplicated: Secondary | ICD-10-CM

## 2023-05-09 ENCOUNTER — Ambulatory Visit: Payer: Medicare Other

## 2023-05-16 DIAGNOSIS — H35371 Puckering of macula, right eye: Secondary | ICD-10-CM | POA: Diagnosis not present

## 2023-05-16 DIAGNOSIS — H353122 Nonexudative age-related macular degeneration, left eye, intermediate dry stage: Secondary | ICD-10-CM | POA: Diagnosis not present

## 2023-05-16 DIAGNOSIS — H30041 Focal chorioretinal inflammation, macular or paramacular, right eye: Secondary | ICD-10-CM | POA: Diagnosis not present

## 2023-05-16 DIAGNOSIS — H353212 Exudative age-related macular degeneration, right eye, with inactive choroidal neovascularization: Secondary | ICD-10-CM | POA: Diagnosis not present

## 2023-05-16 DIAGNOSIS — H4041X0 Glaucoma secondary to eye inflammation, right eye, stage unspecified: Secondary | ICD-10-CM | POA: Diagnosis not present

## 2023-05-16 DIAGNOSIS — H353114 Nonexudative age-related macular degeneration, right eye, advanced atrophic with subfoveal involvement: Secondary | ICD-10-CM | POA: Diagnosis not present

## 2023-05-16 DIAGNOSIS — H04122 Dry eye syndrome of left lacrimal gland: Secondary | ICD-10-CM | POA: Diagnosis not present

## 2023-05-16 DIAGNOSIS — H4061X2 Glaucoma secondary to drugs, right eye, moderate stage: Secondary | ICD-10-CM | POA: Diagnosis not present

## 2023-05-16 DIAGNOSIS — H353211 Exudative age-related macular degeneration, right eye, with active choroidal neovascularization: Secondary | ICD-10-CM | POA: Diagnosis not present

## 2023-05-16 DIAGNOSIS — H35351 Cystoid macular degeneration, right eye: Secondary | ICD-10-CM | POA: Diagnosis not present

## 2023-05-25 ENCOUNTER — Ambulatory Visit
Admission: RE | Admit: 2023-05-25 | Discharge: 2023-05-25 | Disposition: A | Discharge status: Home / Self Care | Source: Ambulatory Visit | Attending: Pulmonary Disease

## 2023-05-25 DIAGNOSIS — R918 Other nonspecific abnormal finding of lung field: Secondary | ICD-10-CM

## 2023-05-25 DIAGNOSIS — R911 Solitary pulmonary nodule: Secondary | ICD-10-CM | POA: Diagnosis not present

## 2023-05-29 ENCOUNTER — Ambulatory Visit: Payer: Medicare Other | Admitting: Internal Medicine

## 2023-06-02 ENCOUNTER — Telehealth: Payer: Self-pay | Admitting: Allergy and Immunology

## 2023-06-02 NOTE — Telephone Encounter (Signed)
 Patient called and stated that he needs his symbacort called in for a 3 month supply. He said that we have written a letter in the past for him to get the 3 month supply. I explained that the reason he only got a 1 month supply is because he needed an appointment. Patient said he was never told that before and always got his prescriptions without an appointment. Patients said the medication cost too much for a 1 month supply. [facility] Patients call back number is [phone]

## 2023-06-05 ENCOUNTER — Encounter: Payer: Self-pay | Admitting: Pulmonary Disease

## 2023-06-05 DIAGNOSIS — K2960 Other gastritis without bleeding: Secondary | ICD-10-CM | POA: Diagnosis not present

## 2023-06-05 DIAGNOSIS — I350 Nonrheumatic aortic (valve) stenosis: Secondary | ICD-10-CM | POA: Diagnosis not present

## 2023-06-05 DIAGNOSIS — M8580 Other specified disorders of bone density and structure, unspecified site: Secondary | ICD-10-CM | POA: Diagnosis not present

## 2023-06-05 DIAGNOSIS — Z7901 Long term (current) use of anticoagulants: Secondary | ICD-10-CM | POA: Diagnosis not present

## 2023-06-05 DIAGNOSIS — M3130 Wegener's granulomatosis without renal involvement: Secondary | ICD-10-CM | POA: Diagnosis not present

## 2023-06-05 DIAGNOSIS — J4540 Moderate persistent asthma, uncomplicated: Secondary | ICD-10-CM | POA: Diagnosis not present

## 2023-06-05 DIAGNOSIS — H409 Unspecified glaucoma: Secondary | ICD-10-CM | POA: Diagnosis not present

## 2023-06-05 DIAGNOSIS — G4700 Insomnia, unspecified: Secondary | ICD-10-CM | POA: Diagnosis not present

## 2023-06-05 DIAGNOSIS — D8989 Other specified disorders involving the immune mechanism, not elsewhere classified: Secondary | ICD-10-CM | POA: Diagnosis not present

## 2023-06-05 DIAGNOSIS — I700 Atherosclerosis of aorta: Secondary | ICD-10-CM | POA: Diagnosis not present

## 2023-06-05 DIAGNOSIS — I4892 Unspecified atrial flutter: Secondary | ICD-10-CM | POA: Diagnosis not present

## 2023-06-05 DIAGNOSIS — J849 Interstitial pulmonary disease, unspecified: Secondary | ICD-10-CM | POA: Diagnosis not present

## 2023-06-05 NOTE — Telephone Encounter (Signed)
 Patient received 1 Breztri sample due to our being very limited on samples.

## 2023-06-05 NOTE — Telephone Encounter (Signed)
 Patient called back to follow up on this on Saturday April 5th. He explained the entire situation again and he was not pleased at all. I told him that I could call in a one month supply to get him through his appointment.  However, he asked that I send in a 51-month supply because that would be cheaper for him.  He did say that he already had an appointment made.  So I told him I could send in a 6-month supply with no refills.  He then told me that I would need to write a letter to the insurance company saying that he needed this medication.  I finally realized that he was talking about a prior authorization.  I informed him that we do not do prior authorizations on the weekends and I actually have no idea how to initiate a prior authorization.  He did not seem very pleased.  I told him he could come into the office on Monday and we would provide a sample of Breztri, which contains Symbicort in addition to a third medicine to help with his asthma.  He said that would be good plan.   I did review and he does have an appointment with Thurston Hole on April 14.  We can start work on the prior authorization now.  Malachi Bonds, MD [facility]

## 2023-06-06 NOTE — Telephone Encounter (Signed)
 Patient called back regarding Prior Authorization for Symbicort. Patient states he has not heard anything regarding his medication. Patient is requesting a call back at [phone].

## 2023-06-06 NOTE — Telephone Encounter (Signed)
 This has been routed to the PA team. I have not heard further.

## 2023-06-06 NOTE — Telephone Encounter (Signed)
 Is there a PA for Symbicort for this patient?

## 2023-06-06 NOTE — Telephone Encounter (Signed)
 Thank you :)

## 2023-06-07 ENCOUNTER — Telehealth: Payer: Self-pay

## 2023-06-07 ENCOUNTER — Other Ambulatory Visit (HOSPITAL_COMMUNITY): Payer: Self-pay

## 2023-06-07 MED ORDER — SYMBICORT 160-4.5 MCG/ACT IN AERO: 2.0000 | INHALATION_SPRAY | Freq: Two times a day (BID) | RESPIRATORY_TRACT | 0 refills | Status: DC

## 2023-06-07 NOTE — Telephone Encounter (Signed)
 Patient has called back again and stated that this is a serious medical problem. The Chad Morrison is not helping and he does not have any Symbicort left. [facility]. Patient is saying that previous prescriptions for 90 day supply of symbicort has needed prior auth. Please call patient back with some information. Patients call back number is [phone]. Patient has been calling about this everyday since Friday April 4th

## 2023-06-07 NOTE — Telephone Encounter (Signed)
 Patient called again about the Symbicort. He says that Markus Daft is not working for him and he needs his 90 day supply of Symbicort. This patient has called almost daily about this.patient would like a call back when prescription is sent in. Patients call back number is [phone]

## 2023-06-07 NOTE — Telephone Encounter (Signed)
 PA not needed, generic Symbicort covered for $15.00/90 days.

## 2023-06-07 NOTE — Telephone Encounter (Signed)
 Symbicort has been sent to the requested pharmacy, it was not been sent in since 12/27/2022. A PA is needed for the 90 day supply per insurance. Please initiate a PA for Symbicort. Thank you

## 2023-06-07 NOTE — Telephone Encounter (Signed)
 Pharmacy Patient Advocate Encounter   Received notification from Pt Calls Messages that prior authorization for Symbicort is required/requested.   Insurance verification completed.   The patient is insured through [employer] .   Per test claim: The current 90 day co-pay is, $15.00.  No PA needed at this time. This test claim was processed through [facility]- copay amounts may vary at other pharmacies due to pharmacy/plan contracts, or as the patient moves through the different stages of their insurance plan.

## 2023-06-08 NOTE — Telephone Encounter (Signed)
 Patient is not using a Camera operator, [facility] is using [facility]. PA is required. Please initiate. Thank you.

## 2023-06-08 NOTE — Telephone Encounter (Signed)
 90 day GENERIC Symbicort does not need PA- as per previous message on test claim $15.00/90 days through [employer].

## 2023-06-09 MED ORDER — BUDESONIDE-FORMOTEROL FUMARATE 160-4.5 MCG/ACT IN AERO: 2.0000 | INHALATION_SPRAY | Freq: Two times a day (BID) | RESPIRATORY_TRACT | 1 refills | Status: DC

## 2023-06-09 NOTE — Telephone Encounter (Signed)
 PA is still not needed-mail order will have to process for the covered medication-generic 10.2gm Symbicort.

## 2023-06-09 NOTE — Telephone Encounter (Signed)
 Called patient to follow up. He stated [facility] told him it will be ready on Monday afternoon due to stock issues. I confirmed he does have enough Symbicort to get him through the weekend. I will follow up again on Monday to ensure he received his Symbicort.

## 2023-06-09 NOTE — Addendum Note (Signed)
 Addended by: Floydene Flock C on: 06/09/2023 09:19 AM   Modules accepted: Orders

## 2023-06-09 NOTE — Telephone Encounter (Signed)
 Patient updated and advised of the change needed. At this time requested medication be sent to a local pharmacy. If any issues arise with receiving/picking up medication patient will contact us.

## 2023-06-11 NOTE — Progress Notes (Unsigned)
   [address] [place] [address] Dept: [phone]  FOLLOW UP NOTE  Patient ID: Chad Morrison, male    DOB: 1933-02-15  Age: 89 y.o. MRN: [id number] Date of Office Visit: 06/12/2023  Assessment  Chief Complaint: No chief complaint on file.  HPI Chad Morrison is an 89 year old male who presents to the clinic for a follow up visit. He was last seen in this clinic on 12/27/2022 by Dr. Kozlow for evaluation of asthma, allergic rhinitis, granulomatosis with polyangiitis, reflux, and monoclonal gammopathy.   Discussed the use of AI scribe software for clinical note transcription with the patient, who gave verbal consent to proceed.  History of Present Illness      Drug Allergies:  Allergies  Allergen Reactions   Penicillins Hives    Has patient had a PCN reaction causing immediate rash, facial/tongue/throat swelling, SOB or lightheadedness with hypotension: No Has patient had a PCN reaction causing severe rash involving mucus membranes or skin necrosis: No Has patient had a PCN reaction that required hospitalization: No Has patient had a PCN reaction occurring within the last 10 years: Yes If all of the above answers are "NO", then may proceed with Cephalosporin use.    Latex Hives and Rash   Other Rash   Sulfa Antibiotics Nausea Only    Unknown to patient    Sulfamethoxazole-Trimethoprim Nausea Only    Physical Exam: There were no vitals taken for this visit.   Physical Exam  Diagnostics:    Assessment and Plan: No diagnosis found.  No orders of the defined types were placed in this encounter.   There are no Patient Instructions on file for this visit.  No follow-ups on file.    Thank you for the opportunity to care for this patient.  Please do not hesitate to contact me with questions.  Marinus Sic, FNP [facility]

## 2023-06-11 NOTE — Patient Instructions (Incomplete)
  1.  Continue to treat and prevent inflammation:   A. Symbicort 160 - 2 inhalations 2 times per day    B. Montelukast 10 mg - 1 tablet 1 time per day  C. Benralizumab injections    D. START Nasacort - 1 spray each nostril 1 time per day after nasal wash  2.  Continue to treat and prevent reflux:   A. Minimize caffeine consumption   B. Nexium 40 mg - 2 times per day  3. If needed:   A. albuterol HFA - 2 inhalations every 4-6 hours  B. Azelastine -1-2 sprays each nostril 1-2 times per day  4.  Return to clinic in 6 months or earlier if problem  5. Blood - IgA/G/M, SPEP w/ immunofixation  6. Ask primary doctor about stool H. Pylori antigen test  7. RSV vaccine???

## 2023-06-12 ENCOUNTER — Encounter: Payer: Self-pay | Admitting: Family Medicine

## 2023-06-12 ENCOUNTER — Ambulatory Visit (INDEPENDENT_AMBULATORY_CARE_PROVIDER_SITE_OTHER): Admitting: Family Medicine

## 2023-06-12 ENCOUNTER — Ambulatory Visit: Admitting: Family Medicine

## 2023-06-12 VITALS — BP 118/58 | HR 64 | Temp 97.1°F | Resp 14

## 2023-06-12 DIAGNOSIS — J3089 Other allergic rhinitis: Secondary | ICD-10-CM | POA: Insufficient documentation

## 2023-06-12 DIAGNOSIS — K219 Gastro-esophageal reflux disease without esophagitis: Secondary | ICD-10-CM | POA: Insufficient documentation

## 2023-06-12 DIAGNOSIS — J3489 Other specified disorders of nose and nasal sinuses: Secondary | ICD-10-CM | POA: Insufficient documentation

## 2023-06-12 DIAGNOSIS — D801 Nonfamilial hypogammaglobulinemia: Secondary | ICD-10-CM | POA: Insufficient documentation

## 2023-06-12 DIAGNOSIS — D472 Monoclonal gammopathy: Secondary | ICD-10-CM | POA: Diagnosis not present

## 2023-06-12 DIAGNOSIS — J4550 Severe persistent asthma, uncomplicated: Secondary | ICD-10-CM | POA: Diagnosis not present

## 2023-06-12 MED ORDER — LEVOCETIRIZINE DIHYDROCHLORIDE 5 MG PO TABS: 5.0000 mg | ORAL_TABLET | Freq: Every evening | ORAL | 1 refills | Status: DC

## 2023-06-12 MED ORDER — MONTELUKAST SODIUM 10 MG PO TABS: 10.0000 mg | ORAL_TABLET | Freq: Every evening | ORAL | 1 refills | Status: DC

## 2023-06-12 NOTE — Telephone Encounter (Signed)
 Followed up with patient in office today during his appointment. [facility] has Rx ready and he is picking up after his OV.

## 2023-06-13 ENCOUNTER — Other Ambulatory Visit (HOSPITAL_COMMUNITY): Payer: Self-pay

## 2023-06-13 ENCOUNTER — Telehealth: Payer: Self-pay | Admitting: Family Medicine

## 2023-06-13 ENCOUNTER — Telehealth: Payer: Self-pay

## 2023-06-13 NOTE — Telephone Encounter (Signed)
 Pharmacy Patient Advocate Encounter  Received notification from [facility] that Prior Authorization for Symbicort 160-4.5MCG/ACT aerosol  has been APPROVED from 06/13/2023 to 06/13/2023   Showing refill too soon until 06/18- patient may have to wait to try and fill Brand or they may call the plan and have an override put in due to generic not working for patient.

## 2023-06-13 NOTE — Telephone Encounter (Signed)
 Chad Morrison called in and stated he needed to speak to [facility] ASAP.  I asked him what was going on and he states he is having issues with the generic Symbicort. Chad Morrison states when he uses the generic Symbicort it feels like it gets stuck in his throat and he feels like he has to use the albuterol after the generic Symbicort.  I tried to ask him in more detail about the "stuck in throat" feeling and he got agitated and told me to just get Rexene Catching to call him back.  So I confirmed his call back number and told him I would send Rexene Catching the message.  He stated he needed her to call him right away.  I told him she is in clinic and will call when she is not with a patient.  He hung up on me.

## 2023-06-13 NOTE — Telephone Encounter (Signed)
 This patient reports that generic Symbicort is causing throat congestion, chest congestion, throat clearing, and chest tightness. Can we please begin an appeal for Symbicort? Thank you

## 2023-06-13 NOTE — Telephone Encounter (Signed)
 I called the patient and he has been notified about the approval. He plans to speak with insurance to get the medication filled sooner.

## 2023-06-13 NOTE — Telephone Encounter (Signed)
*  Asthma/Allergy  Pharmacy Patient Advocate Encounter   Received notification from Pt Calls Messages that prior authorization for Symbicort 160-4.5MCG/ACT aerosol  is required/requested.   Insurance verification completed.   The patient is insured through [facility] .   Per test claim: PA required; PA submitted to above mentioned insurance via CoverMyMeds Key/confirmation #/[id number] Status is pending

## 2023-06-13 NOTE — Telephone Encounter (Signed)
 Please begin PA/ Appeal for Brand Symbicort for 90 day supply.

## 2023-06-19 DIAGNOSIS — H30041 Focal chorioretinal inflammation, macular or paramacular, right eye: Secondary | ICD-10-CM | POA: Diagnosis not present

## 2023-06-19 DIAGNOSIS — H353212 Exudative age-related macular degeneration, right eye, with inactive choroidal neovascularization: Secondary | ICD-10-CM | POA: Diagnosis not present

## 2023-06-19 DIAGNOSIS — H4061X2 Glaucoma secondary to drugs, right eye, moderate stage: Secondary | ICD-10-CM | POA: Diagnosis not present

## 2023-06-19 DIAGNOSIS — H04122 Dry eye syndrome of left lacrimal gland: Secondary | ICD-10-CM | POA: Diagnosis not present

## 2023-06-19 DIAGNOSIS — H35351 Cystoid macular degeneration, right eye: Secondary | ICD-10-CM | POA: Diagnosis not present

## 2023-06-19 DIAGNOSIS — H35371 Puckering of macula, right eye: Secondary | ICD-10-CM | POA: Diagnosis not present

## 2023-06-19 DIAGNOSIS — H4041X0 Glaucoma secondary to eye inflammation, right eye, stage unspecified: Secondary | ICD-10-CM | POA: Diagnosis not present

## 2023-06-19 DIAGNOSIS — H353114 Nonexudative age-related macular degeneration, right eye, advanced atrophic with subfoveal involvement: Secondary | ICD-10-CM | POA: Diagnosis not present

## 2023-06-19 DIAGNOSIS — H353211 Exudative age-related macular degeneration, right eye, with active choroidal neovascularization: Secondary | ICD-10-CM | POA: Diagnosis not present

## 2023-06-19 DIAGNOSIS — H353122 Nonexudative age-related macular degeneration, left eye, intermediate dry stage: Secondary | ICD-10-CM | POA: Diagnosis not present

## 2023-06-27 ENCOUNTER — Ambulatory Visit: Payer: Medicare Other | Admitting: Allergy and Immunology

## 2023-06-27 DIAGNOSIS — Z961 Presence of intraocular lens: Secondary | ICD-10-CM | POA: Diagnosis not present

## 2023-06-27 DIAGNOSIS — H353132 Nonexudative age-related macular degeneration, bilateral, intermediate dry stage: Secondary | ICD-10-CM | POA: Diagnosis not present

## 2023-06-27 DIAGNOSIS — H04123 Dry eye syndrome of bilateral lacrimal glands: Secondary | ICD-10-CM | POA: Diagnosis not present

## 2023-06-27 DIAGNOSIS — H40013 Open angle with borderline findings, low risk, bilateral: Secondary | ICD-10-CM | POA: Diagnosis not present

## 2023-07-03 ENCOUNTER — Ambulatory Visit (INDEPENDENT_AMBULATORY_CARE_PROVIDER_SITE_OTHER)

## 2023-07-03 DIAGNOSIS — J4550 Severe persistent asthma, uncomplicated: Secondary | ICD-10-CM

## 2023-07-04 ENCOUNTER — Ambulatory Visit: Payer: Medicare Other | Admitting: Allergy and Immunology

## 2023-07-11 ENCOUNTER — Ambulatory Visit: Admitting: Allergy and Immunology

## 2023-07-19 ENCOUNTER — Other Ambulatory Visit: Payer: Self-pay

## 2023-07-26 DIAGNOSIS — R252 Cramp and spasm: Secondary | ICD-10-CM | POA: Diagnosis not present

## 2023-07-26 DIAGNOSIS — E039 Hypothyroidism, unspecified: Secondary | ICD-10-CM | POA: Diagnosis not present

## 2023-08-03 DIAGNOSIS — E039 Hypothyroidism, unspecified: Secondary | ICD-10-CM | POA: Diagnosis not present

## 2023-08-07 DIAGNOSIS — H4061X2 Glaucoma secondary to drugs, right eye, moderate stage: Secondary | ICD-10-CM | POA: Diagnosis not present

## 2023-08-07 DIAGNOSIS — H353212 Exudative age-related macular degeneration, right eye, with inactive choroidal neovascularization: Secondary | ICD-10-CM | POA: Diagnosis not present

## 2023-08-07 DIAGNOSIS — H35371 Puckering of macula, right eye: Secondary | ICD-10-CM | POA: Diagnosis not present

## 2023-08-07 DIAGNOSIS — H353211 Exudative age-related macular degeneration, right eye, with active choroidal neovascularization: Secondary | ICD-10-CM | POA: Diagnosis not present

## 2023-08-07 DIAGNOSIS — H353122 Nonexudative age-related macular degeneration, left eye, intermediate dry stage: Secondary | ICD-10-CM | POA: Diagnosis not present

## 2023-08-07 DIAGNOSIS — H35351 Cystoid macular degeneration, right eye: Secondary | ICD-10-CM | POA: Diagnosis not present

## 2023-08-07 DIAGNOSIS — H30041 Focal chorioretinal inflammation, macular or paramacular, right eye: Secondary | ICD-10-CM | POA: Diagnosis not present

## 2023-08-07 DIAGNOSIS — H04122 Dry eye syndrome of left lacrimal gland: Secondary | ICD-10-CM | POA: Diagnosis not present

## 2023-08-07 DIAGNOSIS — H353114 Nonexudative age-related macular degeneration, right eye, advanced atrophic with subfoveal involvement: Secondary | ICD-10-CM | POA: Diagnosis not present

## 2023-08-08 ENCOUNTER — Ambulatory Visit: Admitting: Audiologist

## 2023-08-10 ENCOUNTER — Telehealth: Payer: Self-pay | Admitting: Internal Medicine

## 2023-08-10 DIAGNOSIS — I4821 Permanent atrial fibrillation: Secondary | ICD-10-CM

## 2023-08-10 MED ORDER — APIXABAN 5 MG PO TABS: 5.0000 mg | ORAL_TABLET | Freq: Two times a day (BID) | ORAL | 0 refills | Status: DC

## 2023-08-10 NOTE — Telephone Encounter (Signed)
 Prescription refill request for Eliquis  received. Indication: afib  Last office visit: Thukkani, 05/04/2023 Scr: 1.13, 07/26/2023 Age: 89 yo  Weight: 63.4 kg   Refill sent.

## 2023-08-10 NOTE — Telephone Encounter (Signed)
*  STAT* If patient is at the pharmacy, call can be transferred to refill team.   1. Which medications need to be refilled? (please list name of each medication and dose if known)   apixaban (ELIQUIS) 5 MG TABS tablet   2. Which pharmacy/location (including street and city if local pharmacy) is medication to be sent to? [facility] - [place], [place] - [address] AT [facility] Phone: [phone]  Fax: [phone]     3. Do they need a 30 day or 90 day supply? 90

## 2023-08-17 ENCOUNTER — Ambulatory Visit (INDEPENDENT_AMBULATORY_CARE_PROVIDER_SITE_OTHER): Admitting: Podiatry

## 2023-08-17 ENCOUNTER — Ambulatory Visit (INDEPENDENT_AMBULATORY_CARE_PROVIDER_SITE_OTHER)

## 2023-08-17 ENCOUNTER — Encounter: Payer: Self-pay | Admitting: Podiatry

## 2023-08-17 DIAGNOSIS — M722 Plantar fascial fibromatosis: Secondary | ICD-10-CM | POA: Diagnosis not present

## 2023-08-17 DIAGNOSIS — M2041 Other hammer toe(s) (acquired), right foot: Secondary | ICD-10-CM

## 2023-08-17 DIAGNOSIS — Q828 Other specified congenital malformations of skin: Secondary | ICD-10-CM

## 2023-08-17 DIAGNOSIS — D689 Coagulation defect, unspecified: Secondary | ICD-10-CM

## 2023-08-17 NOTE — Progress Notes (Signed)
 Subjective:   Patient ID: Chad Morrison, male   DOB: 89 y.o.   MRN: [id number]   HPI Patient presents with a lot of pain in the right heel and also has several lesions on both feet underneath the fifth metatarsal head of both feet that becomes tender with patient on blood thinner   ROS      Objective:  Physical Exam  Neurovascular status intact inflammation plantar heel region right from central lateral side with several lesions on the heel fifth metatarsal bilateral with lucent type course painful when pressed directly     Assessment:  Plantar fasciitis right along with porokeratotic lesions bilateral     Plan:  H&P reviewed both conditions and I did do sharp sterile debridement of lesions bilateral fifth metatarsal no iatrogenic bleeding and for the heel I did sterile prep and injected the plantar fascia 3 mg Dexasone Kenalog  5 mg Xylocaine and patient will be seen back as needed

## 2023-08-18 ENCOUNTER — Other Ambulatory Visit: Payer: Self-pay | Admitting: Allergy and Immunology

## 2023-08-21 ENCOUNTER — Telehealth: Payer: Self-pay | Admitting: Family Medicine

## 2023-08-21 ENCOUNTER — Other Ambulatory Visit: Payer: Self-pay | Admitting: Family Medicine

## 2023-08-21 MED ORDER — SYMBICORT 160-4.5 MCG/ACT IN AERO
2.0000 | INHALATION_SPRAY | Freq: Two times a day (BID) | RESPIRATORY_TRACT | 5 refills | Status: DC
Start: 2023-08-21 — End: 2023-08-22

## 2023-08-21 MED ORDER — BUDESONIDE-FORMOTEROL FUMARATE 160-4.5 MCG/ACT IN AERO: 2.0000 | INHALATION_SPRAY | Freq: Two times a day (BID) | RESPIRATORY_TRACT | 1 refills | Status: DC

## 2023-08-21 NOTE — Telephone Encounter (Signed)
 Ordered.  Thank you.

## 2023-08-21 NOTE — Telephone Encounter (Signed)
 PT called to ask about getting Symbicort  RX refilled due to specific form that needs to be completed, I advised would get to provider, he thanked

## 2023-08-22 ENCOUNTER — Other Ambulatory Visit: Payer: Self-pay | Admitting: Family Medicine

## 2023-08-22 ENCOUNTER — Other Ambulatory Visit: Payer: Self-pay

## 2023-08-22 MED ORDER — SYMBICORT 160-4.5 MCG/ACT IN AERO
2.0000 | INHALATION_SPRAY | Freq: Two times a day (BID) | RESPIRATORY_TRACT | 3 refills | Status: DC
Start: 2023-08-22 — End: 2023-10-24

## 2023-08-22 MED ORDER — ROSUVASTATIN CALCIUM 5 MG PO TABS: 5.0000 mg | ORAL_TABLET | Freq: Every day | ORAL | 2 refills | Status: AC

## 2023-08-22 NOTE — Telephone Encounter (Signed)
 PT called back regarding Symbicort  refill, asking to get 3 month supply with 3 refills with no substitutes. Reviewed with Landry, who advised can send to [facility] to complete this morning. Advised PT and he thanked, asked if possible call back can be made

## 2023-08-22 NOTE — Telephone Encounter (Signed)
 Can you please let this patient know I am happy to order 3 months at a time with one refill, however, we will need to see him in the clinic for a follow up visit before ordering more doses.

## 2023-08-24 ENCOUNTER — Ambulatory Visit: Admitting: Audiologist

## 2023-08-25 ENCOUNTER — Other Ambulatory Visit: Payer: Self-pay | Admitting: Allergy and Immunology

## 2023-08-25 ENCOUNTER — Other Ambulatory Visit: Payer: Self-pay | Admitting: Family Medicine

## 2023-08-25 NOTE — Telephone Encounter (Signed)
 Patient has appointment on 08/28/2023. Pending til appointment if there is to be any changes.

## 2023-08-28 ENCOUNTER — Ambulatory Visit

## 2023-08-28 DIAGNOSIS — J4550 Severe persistent asthma, uncomplicated: Secondary | ICD-10-CM | POA: Diagnosis not present

## 2023-08-29 DIAGNOSIS — E7800 Pure hypercholesterolemia, unspecified: Secondary | ICD-10-CM | POA: Diagnosis not present

## 2023-08-29 DIAGNOSIS — E871 Hypo-osmolality and hyponatremia: Secondary | ICD-10-CM | POA: Diagnosis not present

## 2023-08-29 DIAGNOSIS — Z125 Encounter for screening for malignant neoplasm of prostate: Secondary | ICD-10-CM | POA: Diagnosis not present

## 2023-08-29 DIAGNOSIS — R946 Abnormal results of thyroid function studies: Secondary | ICD-10-CM | POA: Diagnosis not present

## 2023-08-29 DIAGNOSIS — D649 Anemia, unspecified: Secondary | ICD-10-CM | POA: Diagnosis not present

## 2023-08-29 DIAGNOSIS — M8580 Other specified disorders of bone density and structure, unspecified site: Secondary | ICD-10-CM | POA: Diagnosis not present

## 2023-09-05 DIAGNOSIS — E039 Hypothyroidism, unspecified: Secondary | ICD-10-CM | POA: Diagnosis not present

## 2023-09-05 DIAGNOSIS — Z1339 Encounter for screening examination for other mental health and behavioral disorders: Secondary | ICD-10-CM | POA: Diagnosis not present

## 2023-09-05 DIAGNOSIS — R82998 Other abnormal findings in urine: Secondary | ICD-10-CM | POA: Diagnosis not present

## 2023-09-05 DIAGNOSIS — I350 Nonrheumatic aortic (valve) stenosis: Secondary | ICD-10-CM | POA: Diagnosis not present

## 2023-09-05 DIAGNOSIS — Z0000 Encounter for general adult medical examination without abnormal findings: Secondary | ICD-10-CM | POA: Diagnosis not present

## 2023-09-05 DIAGNOSIS — I4892 Unspecified atrial flutter: Secondary | ICD-10-CM | POA: Diagnosis not present

## 2023-09-05 DIAGNOSIS — I700 Atherosclerosis of aorta: Secondary | ICD-10-CM | POA: Diagnosis not present

## 2023-09-05 DIAGNOSIS — K2960 Other gastritis without bleeding: Secondary | ICD-10-CM | POA: Diagnosis not present

## 2023-09-05 DIAGNOSIS — D8989 Other specified disorders involving the immune mechanism, not elsewhere classified: Secondary | ICD-10-CM | POA: Diagnosis not present

## 2023-09-05 DIAGNOSIS — M3130 Wegener's granulomatosis without renal involvement: Secondary | ICD-10-CM | POA: Diagnosis not present

## 2023-09-05 DIAGNOSIS — Z7901 Long term (current) use of anticoagulants: Secondary | ICD-10-CM | POA: Diagnosis not present

## 2023-09-05 DIAGNOSIS — M8580 Other specified disorders of bone density and structure, unspecified site: Secondary | ICD-10-CM | POA: Diagnosis not present

## 2023-09-05 DIAGNOSIS — Z1331 Encounter for screening for depression: Secondary | ICD-10-CM | POA: Diagnosis not present

## 2023-09-05 DIAGNOSIS — J4540 Moderate persistent asthma, uncomplicated: Secondary | ICD-10-CM | POA: Diagnosis not present

## 2023-09-05 DIAGNOSIS — H409 Unspecified glaucoma: Secondary | ICD-10-CM | POA: Diagnosis not present

## 2023-09-05 DIAGNOSIS — J849 Interstitial pulmonary disease, unspecified: Secondary | ICD-10-CM | POA: Diagnosis not present

## 2023-09-05 DIAGNOSIS — G4700 Insomnia, unspecified: Secondary | ICD-10-CM | POA: Diagnosis not present

## 2023-09-13 DIAGNOSIS — H353122 Nonexudative age-related macular degeneration, left eye, intermediate dry stage: Secondary | ICD-10-CM | POA: Diagnosis not present

## 2023-09-13 DIAGNOSIS — H30041 Focal chorioretinal inflammation, macular or paramacular, right eye: Secondary | ICD-10-CM | POA: Diagnosis not present

## 2023-09-13 DIAGNOSIS — H353211 Exudative age-related macular degeneration, right eye, with active choroidal neovascularization: Secondary | ICD-10-CM | POA: Diagnosis not present

## 2023-09-13 DIAGNOSIS — H4061X2 Glaucoma secondary to drugs, right eye, moderate stage: Secondary | ICD-10-CM | POA: Diagnosis not present

## 2023-09-13 DIAGNOSIS — H4041X0 Glaucoma secondary to eye inflammation, right eye, stage unspecified: Secondary | ICD-10-CM | POA: Diagnosis not present

## 2023-09-13 DIAGNOSIS — H353212 Exudative age-related macular degeneration, right eye, with inactive choroidal neovascularization: Secondary | ICD-10-CM | POA: Diagnosis not present

## 2023-09-13 DIAGNOSIS — H04122 Dry eye syndrome of left lacrimal gland: Secondary | ICD-10-CM | POA: Diagnosis not present

## 2023-09-13 DIAGNOSIS — H353114 Nonexudative age-related macular degeneration, right eye, advanced atrophic with subfoveal involvement: Secondary | ICD-10-CM | POA: Diagnosis not present

## 2023-09-13 DIAGNOSIS — H35351 Cystoid macular degeneration, right eye: Secondary | ICD-10-CM | POA: Diagnosis not present

## 2023-09-13 DIAGNOSIS — H35371 Puckering of macula, right eye: Secondary | ICD-10-CM | POA: Diagnosis not present

## 2023-09-19 DIAGNOSIS — L538 Other specified erythematous conditions: Secondary | ICD-10-CM | POA: Diagnosis not present

## 2023-09-19 DIAGNOSIS — R208 Other disturbances of skin sensation: Secondary | ICD-10-CM | POA: Diagnosis not present

## 2023-09-19 DIAGNOSIS — Z789 Other specified health status: Secondary | ICD-10-CM | POA: Diagnosis not present

## 2023-09-19 DIAGNOSIS — L728 Other follicular cysts of the skin and subcutaneous tissue: Secondary | ICD-10-CM | POA: Diagnosis not present

## 2023-09-20 ENCOUNTER — Ambulatory Visit (HOSPITAL_COMMUNITY)
Admission: EM | Admit: 2023-09-20 | Discharge: 2023-09-20 | Disposition: A | Discharge status: Home / Self Care | Attending: Family Medicine | Admitting: Family Medicine

## 2023-09-20 DIAGNOSIS — Z7901 Long term (current) use of anticoagulants: Secondary | ICD-10-CM | POA: Diagnosis not present

## 2023-09-20 DIAGNOSIS — S81801A Unspecified open wound, right lower leg, initial encounter: Secondary | ICD-10-CM | POA: Diagnosis not present

## 2023-09-20 NOTE — Discharge Instructions (Signed)
 Hold your Eliquis  this evening and tomorrow morning.

## 2023-09-20 NOTE — ED Triage Notes (Signed)
 Yesterday reports sustaining a laceration to righ tlower leg when a piece of technology bumped his leg while cleaning out the garage. States he had been off Eloquis in anticipation of a dermatology procedure today; upon restarting Eloquis today, wound began bleeding. States he went to derm office @ 1500 and a pressure dressing was placed, but wound continues to bleed. Bright red blood noted from distal aspect of pressure dressing, dripping into shoe.

## 2023-09-23 NOTE — ED Provider Notes (Addendum)
 [facility]   [id number] 09/20/23 Arrival Time: 1931  ASSESSMENT & PLAN:  1. Wound of right lower extremity, initial encounter   2. On continuous oral anticoagulation    Wound to RLE with slow but persistent oozing; mid wound. Three (3) staples appled in hope of slowing this down. With 10-15 minutes of pressure the oozing has eased quite a bit. New pressure dressing applied; sent home with enough supplies for 2-3 changes overnight if needed. Is seeing his dermatologist again in the morning. ED if this wound continues to bleed. Will hold Eliquis  this evening and tomorrow morning. Takes for afib.  Brief history. Yesterday reports sustaining a laceration to righ tlower leg when a piece of technology bumped his leg while cleaning out the garage. States he had been off Eloquis in anticipation of a dermatology procedure today; upon restarting Eloquis today, wound began bleeding. States he went to derm office @ 1500 and a pressure dressing was placed, but wound continues to bleed. Bright red blood noted from distal aspect of pressure dressing, dripping into shoe.  Discharged home.  OBJECTIVE:  Vitals:   09/20/23 1948  BP: (!) 153/78  Pulse: 85  Resp: 18  Temp: 98.3 F (36.8 C)  TempSrc: Oral  SpO2: 98%      Past Medical History:  Diagnosis Date   Anxiety    Asthma    Asthmatic bronchitis    Atrial flutter (HCC)    chads2vasc score is 3   Ehrlichiosis 2008   GERD (gastroesophageal reflux disease)    Granulomatosis with polyangiitis (HCC)    HTN (hypertension)    Hyperlipidemia    Nuclear sclerotic cataract of left eye 06/04/2019   Cataract surgery completed March 2023, Dr. Lavonia   Paroxysmal atrial fibrillation [facility])    Prostate cancer (HCC)    Seasonal allergies    Social History   Socioeconomic History   Marital status: Married    Spouse name: [name]   Number of children: 2   Years of education: Not on file   Highest education level: Not on file   Occupational History   Occupation: RETIRED  Tobacco Use   Smoking status: Former    Current packs/day: 0.00    Average packs/day: 0.5 packs/day for 4.0 years (2.0 ttl pk-yrs)    Types: Cigarettes    Start date: 02/28/1957    Quit date: 02/28/1961    Years since quitting: 62.6   Smokeless tobacco: Never   Tobacco comments:    Former smoker 06/20/22  Vaping Use   Vaping status: Never Used  Substance and Sexual Activity   Alcohol  use: Yes    Alcohol /week: 2.0 - 3.0 standard drinks of alcohol     Types: 2 - 3 Standard drinks or equivalent per week   Drug use: No   Sexual activity: Not on file  Other Topics Concern   Not on file  Social History Narrative   Pt recently moved to [place] from [place]  to be near his son.   Retired.   Social Drivers of Corporate investment banker Strain: Not on file  Food Insecurity: Low Risk  (05/10/2023)   Received from [facility]   Hunger Vital Sign    Within the past 12 months, you worried that your food would run out before you got money to buy more: Not on file    Within the past 12 months, the food you bought just didn't last and you didn't have money to get more. : Never true  Transportation Needs: No Transportation Needs (05/10/2023)   Received from [facility]    In the past 12 months, has lack of reliable transportation kept you from medical appointments, meetings, work or from getting things needed for daily living? : No  Physical Activity: Not on file  Stress: Not on file  Social Connections: Not on file          Rolinda Rogue, MD 09/23/23 1017    Rolinda Rogue, MD 09/23/23 1017

## 2023-09-25 DIAGNOSIS — Z7901 Long term (current) use of anticoagulants: Secondary | ICD-10-CM | POA: Diagnosis not present

## 2023-09-25 DIAGNOSIS — R600 Localized edema: Secondary | ICD-10-CM | POA: Diagnosis not present

## 2023-09-25 DIAGNOSIS — I4892 Unspecified atrial flutter: Secondary | ICD-10-CM | POA: Diagnosis not present

## 2023-09-25 DIAGNOSIS — L97912 Non-pressure chronic ulcer of unspecified part of right lower leg with fat layer exposed: Secondary | ICD-10-CM | POA: Diagnosis not present

## 2023-09-27 ENCOUNTER — Telehealth: Payer: Self-pay | Admitting: Allergy and Immunology

## 2023-09-27 MED ORDER — AZELASTINE HCL 137 MCG/SPRAY NA SOLN
2.0000 | Freq: Two times a day (BID) | NASAL | 0 refills | Status: DC
Start: 2023-09-27 — End: 2023-09-28

## 2023-09-27 NOTE — Telephone Encounter (Signed)
 Patient called stating he is needing a 3 months supply of the Azelastine  nasal spray sent to [facility].

## 2023-09-27 NOTE — Telephone Encounter (Signed)
 I called the patient and informed refill has been sent into the pharmacy. I also scheduled a follow up visit for the patient as he did not have one.

## 2023-09-28 DIAGNOSIS — L97912 Non-pressure chronic ulcer of unspecified part of right lower leg with fat layer exposed: Secondary | ICD-10-CM | POA: Diagnosis not present

## 2023-09-28 DIAGNOSIS — I4892 Unspecified atrial flutter: Secondary | ICD-10-CM | POA: Diagnosis not present

## 2023-09-28 DIAGNOSIS — R600 Localized edema: Secondary | ICD-10-CM | POA: Diagnosis not present

## 2023-09-28 DIAGNOSIS — Z7901 Long term (current) use of anticoagulants: Secondary | ICD-10-CM | POA: Diagnosis not present

## 2023-09-28 MED ORDER — AZELASTINE HCL 137 MCG/SPRAY NA SOLN: 2.0000 | Freq: Two times a day (BID) | NASAL | 0 refills | Status: DC

## 2023-09-28 NOTE — Telephone Encounter (Signed)
 PT called to ask about RX and why it was only a 30-day supply instead of a 90-day supply, requested a call back. Discussed with Rosina, and will route to her for PT assistance

## 2023-09-28 NOTE — Telephone Encounter (Signed)
 Spoke with patient he stated that he is not sure why only a 30 day supply was sent in instead of 90 day supply as requested. I informed patient that I am unsure as to why a 90 day supply was not sent in but I was going to resend it for the 90 day supply. Patient would like the 30 days supply medication out of his chart so that they do not get reordered for 30 day supply. Patient is needing a 90 day supply for all medications as that is what his insurance prefers. I have resent the Azelastine  nasal spray to [facility] and have discontinued the 30 day supply script.

## 2023-09-28 NOTE — Addendum Note (Signed)
 Addended by: Jaquail Mclees N on: 09/28/2023 03:17 PM   Modules accepted: Orders

## 2023-09-29 DIAGNOSIS — L97912 Non-pressure chronic ulcer of unspecified part of right lower leg with fat layer exposed: Secondary | ICD-10-CM | POA: Diagnosis not present

## 2023-09-29 DIAGNOSIS — L299 Pruritus, unspecified: Secondary | ICD-10-CM | POA: Diagnosis not present

## 2023-09-29 DIAGNOSIS — R600 Localized edema: Secondary | ICD-10-CM | POA: Diagnosis not present

## 2023-10-02 DIAGNOSIS — L299 Pruritus, unspecified: Secondary | ICD-10-CM | POA: Diagnosis not present

## 2023-10-02 DIAGNOSIS — Z4802 Encounter for removal of sutures: Secondary | ICD-10-CM | POA: Diagnosis not present

## 2023-10-02 DIAGNOSIS — L97912 Non-pressure chronic ulcer of unspecified part of right lower leg with fat layer exposed: Secondary | ICD-10-CM | POA: Diagnosis not present

## 2023-10-02 DIAGNOSIS — R600 Localized edema: Secondary | ICD-10-CM | POA: Diagnosis not present

## 2023-10-09 DIAGNOSIS — L97912 Non-pressure chronic ulcer of unspecified part of right lower leg with fat layer exposed: Secondary | ICD-10-CM | POA: Diagnosis not present

## 2023-10-09 DIAGNOSIS — R600 Localized edema: Secondary | ICD-10-CM | POA: Diagnosis not present

## 2023-10-09 DIAGNOSIS — I4892 Unspecified atrial flutter: Secondary | ICD-10-CM | POA: Diagnosis not present

## 2023-10-09 DIAGNOSIS — Z7901 Long term (current) use of anticoagulants: Secondary | ICD-10-CM | POA: Diagnosis not present

## 2023-10-12 ENCOUNTER — Encounter: Payer: Self-pay | Admitting: Internal Medicine

## 2023-10-17 DIAGNOSIS — H353122 Nonexudative age-related macular degeneration, left eye, intermediate dry stage: Secondary | ICD-10-CM | POA: Diagnosis not present

## 2023-10-17 DIAGNOSIS — H04122 Dry eye syndrome of left lacrimal gland: Secondary | ICD-10-CM | POA: Diagnosis not present

## 2023-10-17 DIAGNOSIS — H30041 Focal chorioretinal inflammation, macular or paramacular, right eye: Secondary | ICD-10-CM | POA: Diagnosis not present

## 2023-10-17 DIAGNOSIS — H35371 Puckering of macula, right eye: Secondary | ICD-10-CM | POA: Diagnosis not present

## 2023-10-17 DIAGNOSIS — H353211 Exudative age-related macular degeneration, right eye, with active choroidal neovascularization: Secondary | ICD-10-CM | POA: Diagnosis not present

## 2023-10-17 DIAGNOSIS — H35351 Cystoid macular degeneration, right eye: Secondary | ICD-10-CM | POA: Diagnosis not present

## 2023-10-17 DIAGNOSIS — H30001 Unspecified focal chorioretinal inflammation, right eye: Secondary | ICD-10-CM | POA: Diagnosis not present

## 2023-10-17 DIAGNOSIS — H4061X2 Glaucoma secondary to drugs, right eye, moderate stage: Secondary | ICD-10-CM | POA: Diagnosis not present

## 2023-10-17 DIAGNOSIS — H353114 Nonexudative age-related macular degeneration, right eye, advanced atrophic with subfoveal involvement: Secondary | ICD-10-CM | POA: Diagnosis not present

## 2023-10-17 DIAGNOSIS — H4041X0 Glaucoma secondary to eye inflammation, right eye, stage unspecified: Secondary | ICD-10-CM | POA: Diagnosis not present

## 2023-10-17 DIAGNOSIS — H353212 Exudative age-related macular degeneration, right eye, with inactive choroidal neovascularization: Secondary | ICD-10-CM | POA: Diagnosis not present

## 2023-10-18 DIAGNOSIS — L728 Other follicular cysts of the skin and subcutaneous tissue: Secondary | ICD-10-CM | POA: Diagnosis not present

## 2023-10-20 DIAGNOSIS — L97912 Non-pressure chronic ulcer of unspecified part of right lower leg with fat layer exposed: Secondary | ICD-10-CM | POA: Diagnosis not present

## 2023-10-20 DIAGNOSIS — R29898 Other symptoms and signs involving the musculoskeletal system: Secondary | ICD-10-CM | POA: Diagnosis not present

## 2023-10-20 DIAGNOSIS — R21 Rash and other nonspecific skin eruption: Secondary | ICD-10-CM | POA: Diagnosis not present

## 2023-10-20 DIAGNOSIS — L2489 Irritant contact dermatitis due to other agents: Secondary | ICD-10-CM | POA: Diagnosis not present

## 2023-10-20 DIAGNOSIS — I350 Nonrheumatic aortic (valve) stenosis: Secondary | ICD-10-CM | POA: Diagnosis not present

## 2023-10-23 ENCOUNTER — Ambulatory Visit

## 2023-10-23 ENCOUNTER — Ambulatory Visit: Payer: Self-pay | Admitting: Internal Medicine

## 2023-10-23 ENCOUNTER — Ambulatory Visit (HOSPITAL_COMMUNITY)
Admission: RE | Admit: 2023-10-23 | Discharge: 2023-10-23 | Disposition: A | Discharge status: Home / Self Care | Source: Ambulatory Visit | Attending: Cardiology | Admitting: Cardiology

## 2023-10-23 DIAGNOSIS — I350 Nonrheumatic aortic (valve) stenosis: Secondary | ICD-10-CM | POA: Insufficient documentation

## 2023-10-23 LAB — ECHOCARDIOGRAM COMPLETE
AR max vel: 0.46 cm2
AV Area VTI: 0.45 cm2
AV Area mean vel: 0.44 cm2
AV Mean grad: 52 mmHg
AV Peak grad: 81.4 mmHg
Ao pk vel: 4.51 m/s
Area-P 1/2: 2.43 cm2
MV M vel: 5.3 m/s
MV Peak grad: 112.4 mmHg
P 1/2 time: 316 ms
Radius: 0.5 cm
S' Lateral: 3.2 cm

## 2023-10-24 ENCOUNTER — Ambulatory Visit

## 2023-10-24 ENCOUNTER — Ambulatory Visit (INDEPENDENT_AMBULATORY_CARE_PROVIDER_SITE_OTHER): Admitting: Allergy and Immunology

## 2023-10-24 VITALS — BP 122/66 | HR 76 | Temp 97.6°F | Resp 16 | Ht 69.0 in | Wt 142.4 lb

## 2023-10-24 DIAGNOSIS — D801 Nonfamilial hypogammaglobulinemia: Secondary | ICD-10-CM | POA: Diagnosis not present

## 2023-10-24 DIAGNOSIS — D472 Monoclonal gammopathy: Secondary | ICD-10-CM | POA: Diagnosis not present

## 2023-10-24 DIAGNOSIS — I350 Nonrheumatic aortic (valve) stenosis: Secondary | ICD-10-CM

## 2023-10-24 DIAGNOSIS — J3089 Other allergic rhinitis: Secondary | ICD-10-CM | POA: Diagnosis not present

## 2023-10-24 DIAGNOSIS — J4550 Severe persistent asthma, uncomplicated: Secondary | ICD-10-CM

## 2023-10-24 DIAGNOSIS — K219 Gastro-esophageal reflux disease without esophagitis: Secondary | ICD-10-CM

## 2023-10-24 DIAGNOSIS — J3489 Other specified disorders of nose and nasal sinuses: Secondary | ICD-10-CM | POA: Diagnosis not present

## 2023-10-24 MED ORDER — MONTELUKAST SODIUM 10 MG PO TABS: 10.0000 mg | ORAL_TABLET | Freq: Every evening | ORAL | 1 refills | Status: DC

## 2023-10-24 MED ORDER — SYMBICORT 160-4.5 MCG/ACT IN AERO: 2.0000 | INHALATION_SPRAY | Freq: Two times a day (BID) | RESPIRATORY_TRACT | 1 refills | Status: DC

## 2023-10-24 MED ORDER — ALBUTEROL SULFATE HFA 108 (90 BASE) MCG/ACT IN AERS: 2.0000 | INHALATION_SPRAY | RESPIRATORY_TRACT | 1 refills | Status: AC | PRN

## 2023-10-24 MED ORDER — AZELASTINE HCL 137 MCG/SPRAY NA SOLN: 2.0000 | Freq: Two times a day (BID) | NASAL | 1 refills | Status: DC

## 2023-10-24 NOTE — Progress Notes (Addendum)
 Patient ID: Chad Morrison MRN: [id number] DOB/AGE: 1932/06/27 89 y.o.  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 male with the indicated medical history here for recommendations regarding his severe aortic stenosis seen on echocardiogram.  The patient was referred by nurse practitioner Caroll in the atrial fibrillation clinic the patient was seen in April of this year and was doing relatively well.   The patient is here with his wife.  He is relatively active.  He exercises at the [facility] on a regular basis using a stationary bike.  He denies any exertional dyspnea, exertional angina, presyncope or syncope.  His atrial fibrillation has not really bother him whatsoever.  He has not required emergency room visits or hospitalizations recently for any breathing difficulties.  He has had no issues with bleeding or bruising while on Eliquis .  In terms of other symptoms his Wegener's granulomatosis is very well controlled on immunotherapy.   He is been retired for a long time.  He moved to [place] some years ago because his son is here.  He used to live in [place]  since the 90s.  He does not smoke or drink.  He sees a Education officer, community on a regular basis and reports very good dental health.  Plan:  Follow up in 3 months.  August 2023:  The patient returns for 68-month follow-up.  The patient was seen in April regarding his aortic valvular disease.  At that  point in time he was he was doing relatively well and was asymptomatic with regards to his aortic stenosis.  The patient is here with his wife.  He continues to be very active.  He walks [employer] park on a regular basis.  When he goes up hills he might have to stop but this is relatively unchanged versus prior.  He has required no emergency room visits or hospitalizations.  He denies any exertional angina, presyncope or syncope.  His biggest issue recently was left eye pain and this is being actively managed by ophthalmology.  Plan:  Monitor for now, follow up 3 months.  February 2024: In the interim since I saw him last an echocardiogram was done which demonstrated progression to severe aortic stenosis with a preserved ejection fraction.  He was recently admitted to the hospital for probable pneumonia and treated with antibiotics.  He was in the hospital for 1 night.  He is about 12 days out from his hospitalization.  He is feeling better but not back up to speed.  He is still able to walk [place] and do exercises at the [facility] with only slight shortness of breath.  He does not believe that this is too limiting.  He denies any presyncope, syncope, or chest pain.  Plan: 78-month follow-up.  August 2024:  The patient was admitted to the hospital with lower extremity edema and found to be profoundly hyponatremic.  His hyponatremia was thought to be due to inadequately treated hypothyroidism.  He was administered IV levothyroxine   with improvement in his edema and sodium levels.  He was seen in cardiology clinic in April and was improved however he did note some shortness of breath with activity.  2+ peripheral edema was present.  His Lasix was increased to 40 mg twice a day for 2 days.  He was seen in atrial fibrillation clinic and again in general cardiology clinic after that and was feeling well without any reports of dyspnea or exertional angina.  He continues to do well.  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  However he never gets short of breath when he has to stop and take a rest because of his leg fatigue.  He denies any chest pain, presyncope, or syncope.  He is walking about a mile or 2 every day.  [facility].  He is getting stronger since being discharged from the hospital.  He does see a dentist on a regular basis and reports good dental health.  Plan: Follow-up in 6 months.  March 2025:  Patient consents to use of AI scribe. The patient returns for routine follow-up.  His aortic valve stenosis has worsened slightly, but his heart function remains normal. No chest discomfort, chest tightness, lightheadedness, blacking out spells, or dizziness during physical activity. He maintains his usual level of physical activity, walking a mile twice a week with his dog, though he sometimes needs to stop and rest due to leg fatigue rather than dyspnea. His walking ability is unchanged from a year ago.  He has a history of atrial fibrillation and a right bundle branch block. He has not experienced any strokes. He is aware of the potential need for a pacemaker in the future due to his cardiac condition.  He underwent cataract surgery on his right eye, which resulted in complications. The cataract splintered during removal, causing ongoing irritation despite corrective measures for the past two to three years.  Plan: Continue medical therapy, follow-up in 6 months with repeat echocardiogram  September 2025:  Patient consents to use of AI scribe. The patient contacted our office reporting increasing fatigue.  An echocardiogram demonstrated progression of aortic stenosis.  He is here to discuss further.  He experiences worsening shortness of breath and fatigue, significantly impacting his daily activities, such [place], due to increased leg fatigue and tiredness. His legs  feel tired even upon waking in the morning.  He recalls a recent leg injury caused by a heavy receiver falling on it, resulting in a significant gash that bled heavily due to his use of Eliquis . He sought care at an urgent care center where his leg was wrapped tightly to stop the bleeding. He followed up with a wound care specialist and wore wraps for about a month, during which he ceased walking. Although the leg injury has mostly healed, he continues to experience shortness of breath and fatigue upon resuming physical activity.  No lightheadedness, chest discomfort, or palpitations. His symptoms of shortness of breath and fatigue occur only with exertion and not at rest. He prefers to sleep with his head elevated, attributing this to long-standing allergies, and denies any breathing difficulties while lying flat  Past Medical History:  Diagnosis Date   Anxiety    Asthma    Asthmatic bronchitis    Atrial flutter (HCC)    chads2vasc score is 3   Ehrlichiosis 2008  GERD (gastroesophageal reflux disease)    Granulomatosis with polyangiitis (HCC)    HTN (hypertension)    Hyperlipidemia    Nuclear sclerotic cataract of left eye 06/04/2019   Cataract surgery completed March 2023, Dr. Lavonia   Paroxysmal atrial fibrillation [facility])    Prostate cancer (HCC)    Seasonal allergies     Past Surgical History:  Procedure Laterality Date   BASAL CELL CARCINOMA EXCISION  2013   EYELID   HERNIA REPAIR Bilateral 2000, 2002   KNEE ARTHROSCOPY  2000   prostectomy  1995   RADICAL   TONSILLECTOMY  1940    Family History  Problem Relation Age of Onset   Asthma Mother 22   Allergies Mother    Ulcers Mother    CVA Father 66   Heart Problems Father    Other Father        NEUROLOGIC ISSUES    Other Brother 64       BICYCLE ACCIDENT   Migraines Son    Diabetes Mellitus I Son     Social History   Socioeconomic History   Marital status: Married    Spouse name: [name]   Number of children: 2    Years of education: Not on file   Highest education level: Not on file  Occupational History   Occupation: RETIRED  Tobacco Use   Smoking status: Former    Current packs/day: 0.00    Average packs/day: 0.5 packs/day for 4.0 years (2.0 ttl pk-yrs)    Types: Cigarettes    Start date: 02/28/1957    Quit date: 02/28/1961    Years since quitting: 62.7   Smokeless tobacco: Never   Tobacco comments:    Former smoker 06/20/22  Vaping Use   Vaping status: Never Used  Substance and Sexual Activity   Alcohol  use: Yes    Alcohol /week: 2.0 - 3.0 standard drinks of alcohol     Types: 2 - 3 Standard drinks or equivalent per week   Drug use: No   Sexual activity: Not on file  Other Topics Concern   Not on file  Social History Narrative   Pt recently moved to [place] from [place]  to be near his son.   Retired.   Social Drivers of Corporate investment banker Strain: Not on file  Food Insecurity: Low Risk  (05/10/2023)   Received from [facility]   Hunger Vital Sign    Within the past 12 months, you worried that your food would run out before you got money to buy more: Not on file    Within the past 12 months, the food you bought just didn't last and you didn't have money to get more. : Never true  Transportation Needs: No Transportation Needs (05/10/2023)   Received from [facility]    In the past 12 months, has lack of reliable transportation kept you from medical appointments, meetings, work or from getting things needed for daily living? : No  Physical Activity: Not on file  Stress: Not on file  Social Connections: Not on file  Intimate Partner Violence: Not At Risk (05/31/2022)   Humiliation, Afraid, Rape, and Kick questionnaire    Fear of Current or Ex-Partner: No    Emotionally Abused: No    Physically Abused: No    Sexually Abused: No     Prior to Admission medications   Medication Sig Start Date End Date Taking? Authorizing Provider  albuterol  (VENTOLIN   HFA) 108 (90 Base) MCG/ACT inhaler  Inhale 2 puffs into the lungs every 4 (four) hours as needed for wheezing or shortness of breath. 06/22/21   Kozlow, Camellia PARAS, MD  ALPRAZolam  (XANAX ) 0.25 MG tablet Take 0.25 mg by mouth at bedtime as needed for sleep.     [provider]  Ascorbic Acid (VITAMIN C PO) Take 1 tablet by mouth every morning. Not sure the dosage    [provider]  azelastine  (ASTELIN ) 0.1 % nasal spray Place 1 spray into both nostrils 2 (two) times daily. 03/31/21   [provider]  budesonide -formoterol  (SYMBICORT ) 160-4.5 MCG/ACT inhaler Inhale 2 puffs into the lungs in the morning and at bedtime. 06/22/21   Kozlow, Camellia PARAS, MD  Calcium  Carbonate-Vitamin D (CALCIUM -VITAMIN D) 600-125 MG-UNIT TABS Take 1 tablet by mouth 2 (two) times daily.     [provider]  diltiazem  (CARDIZEM  CD) 180 MG 24 hr capsule TAKE 1 CAPSULE EVERY       MORNING AND AT BEDTIME 02/23/21   Fenton, Clint R, PA  ELIQUIS  5 MG TABS tablet TAKE 1 TABLET TWICE A DAY 02/23/21   Fenton, Clint R, PA  EPINEPHrine  0.3 mg/0.3 mL IJ SOAJ injection SMARTSIG:0.3 Milligram(s) IM Once 06/22/21   Kozlow, Camellia PARAS, MD  esomeprazole  (NEXIUM ) 40 MG capsule Take 1 capsule (40 mg total) by mouth 2 (two) times daily. 06/22/21   Kozlow, Camellia PARAS, MD  Ferrous Sulfate  (SLOW FE PO) Take 1 tablet by mouth every morning.    [provider]  fluorouracil (EFUDEX) 5 % cream SMARTSIG:sparingly Topical Twice Daily 05/18/21   [provider]  levocetirizine (XYZAL ) 5 MG tablet Take 1 tablet (5 mg total) by mouth 2 (two) times daily as needed for allergies (Can take an extra dose during flare ups.). 06/22/21   Kozlow, Camellia PARAS, MD  levothyroxine  (SYNTHROID , LEVOTHROID) 75 MCG tablet Take 75 mcg by mouth daily before breakfast.    [provider]  montelukast  (SINGULAIR ) 10 MG tablet Take 1 tablet (10 mg total) by mouth at bedtime. 06/22/21   Kozlow, Eric J, MD  Multiple Vitamin (MULTIVITAMIN WITH  MINERALS) TABS tablet Take 1 tablet daily by mouth.    [provider]  Multiple Vitamins-Minerals (OCUVITE PRESERVISION PO) Take 1 tablet 2 (two) times daily by mouth.    [provider]  Polyethyl Glycol-Propyl Glycol (SYSTANE OP) Apply 1 drop to eye every morning.    [provider]  PROLENSA  0.07 % SOLN INSTILL 1 DROP INTO THE    RIGHT EYE DAILY 07/19/21   Rankin, Arley LABOR, MD  Propylene Glycol 0.6 % SOLN Apply 1 drop to eye in the morning.    [provider]  rosuvastatin  (CRESTOR ) 5 MG tablet TAKE 1 TABLET DAILY 01/19/21   Fenton, Clint R, PA    Allergies  Allergen Reactions   Penicillins Hives    Has patient had a PCN reaction causing immediate rash, facial/tongue/throat swelling, SOB or lightheadedness with hypotension: No Has patient had a PCN reaction causing severe rash involving mucus membranes or skin necrosis: No Has patient had a PCN reaction that required hospitalization: No Has patient had a PCN reaction occurring within the last 10 years: Yes If all of the above answers are NO, then may proceed with Cephalosporin use.    Latex Hives and Rash   Other Rash   Sulfa Antibiotics Nausea Only    Unknown to patient    Sulfamethoxazole-Trimethoprim Nausea Only    REVIEW OF SYSTEMS:  General: no fevers/chills/night sweats Eyes:  no blurry vision, diplopia, or amaurosis ENT: no sore throat or hearing loss Resp: no cough, wheezing, or hemoptysis CV: no edema or palpitations GI: no abdominal pain, nausea, vomiting, diarrhea, or constipation GU: no dysuria, frequency, or hematuria Skin: no rash Neuro: no headache, numbness, tingling, or weakness of extremities Musculoskeletal: no joint pain or swelling Heme: no bleeding, DVT, or easy bruising Endo: no polydipsia or polyuria  BP 124/60   Pulse 78   Ht 5' 9 (1.753 m)   Wt 139 lb 3.2 oz (63.1 kg)   SpO2 99%   BMI 20.56 kg/m   PHYSICAL EXAM: GEN:  AO x 3 in no acute distress HEENT:  normal Dentition: Normal Neck: JVP normal. +2 carotid upstrokes without bruits. No thyromegaly. Lungs: equal expansion, clear bilaterally CV: Apex is discrete and nondisplaced, RRR with 4/6 SEM Abd: soft, non-tender, non-distended; no bruit; positive bowel sounds Ext: no edema, ecchymoses, or cyanosis Vascular: 2+ femoral pulses, 2+ radial pulses       Skin: warm and dry without rash Neuro: CN II-XII grossly intact; motor and sensory grossly intact    DATA AND STUDIES:  EKG: Atrial fibrillation with a right bundle branch block  2D ECHO: 2025 1. Left ventricular ejection fraction, by estimation, is 50 to 55%. The  left ventricle has low normal function. The left ventricle has no regional  wall motion abnormalities. There is moderate left ventricular hypertrophy.  Left ventricular diastolic  parameters are indeterminate. The average left ventricular global  longitudinal strain is -13.6 %. The global longitudinal strain is  abnormal.   2. Right ventricular systolic function is normal. The right ventricular  size is normal. There is normal pulmonary artery systolic pressure.   3. Left atrial size was severely dilated.   4. Right atrial size was severely dilated.   5. The mitral valve is degenerative. Mild mitral valve regurgitation. No  evidence of mitral stenosis. Moderate mitral annular calcification.   6. Compared to 04/30/33 mean gradient 37.2->52 mmHg, peak 67.2->81.4 mmHg  AVA 0.6 cm2-0.5 cm2 DVI 0.15-> 0.16 . The aortic valve is tricuspid. There  is severe calcifcation of the aortic valve. There is severe thickening of  the aortic valve. Aortic valve  regurgitation is mild. Severe aortic valve stenosis.   7. Aortic dilatation noted. There is mild dilatation of the aortic root,  measuring 38 mm.   8. The inferior vena cava is normal in size with greater than 50%  respiratory variability, suggesting right atrial pressure of 3 mmHg.   CARDIAC CATH: n/a  STS RISK CALCULATOR:  pending  NHYA CLASS: 2    ASSESSMENT AND PLAN:   Nonrheumatic aortic valve stenosis - Plan: CBC, Basic metabolic panel with GFR, EKG 12-Lead  Permanent atrial fibrillation (HCC) - Plan: CBC, Basic metabolic panel with GFR, EKG 12-Lead  Secondary hypercoagulable state (HCC) - Plan: CBC, Basic metabolic panel with GFR, EKG 12-Lead  Essential hypertension - Plan: CBC, Basic metabolic panel with GFR, EKG 12-Lead  Hyperlipidemia LDL goal <70 - Plan: CBC, Basic metabolic panel with GFR, EKG 12-Lead  Aortic atherosclerosis (HCC) - Plan: CBC, Basic metabolic panel with GFR, EKG 12-Lead  CKD (chronic kidney disease) stage 2, GFR 60-89 ml/min - Plan: CBC, Basic metabolic panel with GFR, EKG 12-Lead  Pre-procedure lab exam - Plan: CBC, Basic metabolic panel with GFR  Aortic stenosis: The patient has developed symptomatic aortic stenosis.  His mean gradient is now above 50 mmHg consistent with critical aortic stenosis.  I refer the patient  for coronary angiography, right heart catheterization, CT scan, and a cardiothoracic surgery opinion.  I did speak to him about the risk for it permanent pacemaker if TAVR is pursued given his history of atrial fibrillation and right bundle branch block.  Additionally I told the patient to contact us  if he develops any severe presyncope.  If he develops syncope I told him to pursue admission here at [facility].  The patient understands and agrees with this plan. Permanent atrial fibrillation: Continue Eliquis  5 mg twice daily and Cardizem  180 mg daily. Secondary hypercoagulable state: Continue Eliquis  5 mg twice daily. Hypertension: BP well-controlled today Hyperlipidemia: Continue Crestor  5 mg daily.  Lipids followed by PCP. Aortic atherosclerosis: Continue Eliquis  5 mg twice daily instead of aspirin  and Crestor  5 mg daily. CKD stage II: Monitor for now.  I spent 41 minutes reviewing all clinical data during and prior to this visit including all  relevant imaging studies, laboratories, clinical information from other health systems and prior notes from both Cardiology and other specialties, interviewing the patient, conducting a complete physical examination, and coordinating care in order to formulate a comprehensive and personalized evaluation and treatment plan.   Tyia Binford K Lattie Cervi, MD  11/01/2023 10:05 AM    [facility] [address] [place], [place], [place]  [address] Phone: [phone]; Fax: [phone]

## 2023-10-24 NOTE — Patient Instructions (Addendum)
  1.  Continue to treat and prevent inflammation:   A. Symbicort  160 - 2 inhalations 2 times per day    B. Montelukast  10 mg - 1 tablet 1 time per day  C. Benralizumab  injections     2.  Continue to treat and prevent reflux:   A. Minimize caffeine consumption   B. Pantoprazole  and famotidine. Follow up with GI   3. If needed:   A. albuterol  HFA - 2 inhalations every 4-6 hours  B. Azelastine  -1-2 sprays each nostril 1-2 times per day  4.  Blood - SPEP with immunoelectrophoresis, IgA/G/M  5.  Return to clinic in 6 months or earlier if problem  6. Influenza = Tamiflu. Covid = Paxlovid  7. Plan for fall flu vaccine

## 2023-10-24 NOTE — H&P (View-Only) (Signed)
 Patient ID: Chad Morrison MRN: [id number] DOB/AGE: 1932/06/27 89 y.o.  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 male with the indicated medical history here for recommendations regarding his severe aortic stenosis seen on echocardiogram.  The patient was referred by nurse practitioner Caroll in the atrial fibrillation clinic the patient was seen in April of this year and was doing relatively well.   The patient is here with his wife.  He is relatively active.  He exercises at the [facility] on a regular basis using a stationary bike.  He denies any exertional dyspnea, exertional angina, presyncope or syncope.  His atrial fibrillation has not really bother him whatsoever.  He has not required emergency room visits or hospitalizations recently for any breathing difficulties.  He has had no issues with bleeding or bruising while on Eliquis .  In terms of other symptoms his Wegener's granulomatosis is very well controlled on immunotherapy.   He is been retired for a long time.  He moved to [place] some years ago because his son is here.  He used to live in [place]  since the 90s.  He does not smoke or drink.  He sees a Education officer, community on a regular basis and reports very good dental health.  Plan:  Follow up in 3 months.  August 2023:  The patient returns for 68-month follow-up.  The patient was seen in April regarding his aortic valvular disease.  At that  point in time he was he was doing relatively well and was asymptomatic with regards to his aortic stenosis.  The patient is here with his wife.  He continues to be very active.  He walks [employer] park on a regular basis.  When he goes up hills he might have to stop but this is relatively unchanged versus prior.  He has required no emergency room visits or hospitalizations.  He denies any exertional angina, presyncope or syncope.  His biggest issue recently was left eye pain and this is being actively managed by ophthalmology.  Plan:  Monitor for now, follow up 3 months.  February 2024: In the interim since I saw him last an echocardiogram was done which demonstrated progression to severe aortic stenosis with a preserved ejection fraction.  He was recently admitted to the hospital for probable pneumonia and treated with antibiotics.  He was in the hospital for 1 night.  He is about 12 days out from his hospitalization.  He is feeling better but not back up to speed.  He is still able to walk [place] and do exercises at the [facility] with only slight shortness of breath.  He does not believe that this is too limiting.  He denies any presyncope, syncope, or chest pain.  Plan: 78-month follow-up.  August 2024:  The patient was admitted to the hospital with lower extremity edema and found to be profoundly hyponatremic.  His hyponatremia was thought to be due to inadequately treated hypothyroidism.  He was administered IV levothyroxine   with improvement in his edema and sodium levels.  He was seen in cardiology clinic in April and was improved however he did note some shortness of breath with activity.  2+ peripheral edema was present.  His Lasix was increased to 40 mg twice a day for 2 days.  He was seen in atrial fibrillation clinic and again in general cardiology clinic after that and was feeling well without any reports of dyspnea or exertional angina.  He continues to do well.  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  However he never gets short of breath when he has to stop and take a rest because of his leg fatigue.  He denies any chest pain, presyncope, or syncope.  He is walking about a mile or 2 every day.  [facility].  He is getting stronger since being discharged from the hospital.  He does see a dentist on a regular basis and reports good dental health.  Plan: Follow-up in 6 months.  March 2025:  Patient consents to use of AI scribe. The patient returns for routine follow-up.  His aortic valve stenosis has worsened slightly, but his heart function remains normal. No chest discomfort, chest tightness, lightheadedness, blacking out spells, or dizziness during physical activity. He maintains his usual level of physical activity, walking a mile twice a week with his dog, though he sometimes needs to stop and rest due to leg fatigue rather than dyspnea. His walking ability is unchanged from a year ago.  He has a history of atrial fibrillation and a right bundle branch block. He has not experienced any strokes. He is aware of the potential need for a pacemaker in the future due to his cardiac condition.  He underwent cataract surgery on his right eye, which resulted in complications. The cataract splintered during removal, causing ongoing irritation despite corrective measures for the past two to three years.  Plan: Continue medical therapy, follow-up in 6 months with repeat echocardiogram  September 2025:  Patient consents to use of AI scribe. The patient contacted our office reporting increasing fatigue.  An echocardiogram demonstrated progression of aortic stenosis.  He is here to discuss further.  He experiences worsening shortness of breath and fatigue, significantly impacting his daily activities, such [place], due to increased leg fatigue and tiredness. His legs  feel tired even upon waking in the morning.  He recalls a recent leg injury caused by a heavy receiver falling on it, resulting in a significant gash that bled heavily due to his use of Eliquis . He sought care at an urgent care center where his leg was wrapped tightly to stop the bleeding. He followed up with a wound care specialist and wore wraps for about a month, during which he ceased walking. Although the leg injury has mostly healed, he continues to experience shortness of breath and fatigue upon resuming physical activity.  No lightheadedness, chest discomfort, or palpitations. His symptoms of shortness of breath and fatigue occur only with exertion and not at rest. He prefers to sleep with his head elevated, attributing this to long-standing allergies, and denies any breathing difficulties while lying flat  Past Medical History:  Diagnosis Date   Anxiety    Asthma    Asthmatic bronchitis    Atrial flutter (HCC)    chads2vasc score is 3   Ehrlichiosis 2008  GERD (gastroesophageal reflux disease)    Granulomatosis with polyangiitis (HCC)    HTN (hypertension)    Hyperlipidemia    Nuclear sclerotic cataract of left eye 06/04/2019   Cataract surgery completed March 2023, Dr. Lavonia   Paroxysmal atrial fibrillation [facility])    Prostate cancer (HCC)    Seasonal allergies     Past Surgical History:  Procedure Laterality Date   BASAL CELL CARCINOMA EXCISION  2013   EYELID   HERNIA REPAIR Bilateral 2000, 2002   KNEE ARTHROSCOPY  2000   prostectomy  1995   RADICAL   TONSILLECTOMY  1940    Family History  Problem Relation Age of Onset   Asthma Mother 22   Allergies Mother    Ulcers Mother    CVA Father 66   Heart Problems Father    Other Father        NEUROLOGIC ISSUES    Other Brother 64       BICYCLE ACCIDENT   Migraines Son    Diabetes Mellitus I Son     Social History   Socioeconomic History   Marital status: Married    Spouse name: [name]   Number of children: 2    Years of education: Not on file   Highest education level: Not on file  Occupational History   Occupation: RETIRED  Tobacco Use   Smoking status: Former    Current packs/day: 0.00    Average packs/day: 0.5 packs/day for 4.0 years (2.0 ttl pk-yrs)    Types: Cigarettes    Start date: 02/28/1957    Quit date: 02/28/1961    Years since quitting: 62.7   Smokeless tobacco: Never   Tobacco comments:    Former smoker 06/20/22  Vaping Use   Vaping status: Never Used  Substance and Sexual Activity   Alcohol  use: Yes    Alcohol /week: 2.0 - 3.0 standard drinks of alcohol     Types: 2 - 3 Standard drinks or equivalent per week   Drug use: No   Sexual activity: Not on file  Other Topics Concern   Not on file  Social History Narrative   Pt recently moved to [place] from [place]  to be near his son.   Retired.   Social Drivers of Corporate investment banker Strain: Not on file  Food Insecurity: Low Risk  (05/10/2023)   Received from [facility]   Hunger Vital Sign    Within the past 12 months, you worried that your food would run out before you got money to buy more: Not on file    Within the past 12 months, the food you bought just didn't last and you didn't have money to get more. : Never true  Transportation Needs: No Transportation Needs (05/10/2023)   Received from [facility]    In the past 12 months, has lack of reliable transportation kept you from medical appointments, meetings, work or from getting things needed for daily living? : No  Physical Activity: Not on file  Stress: Not on file  Social Connections: Not on file  Intimate Partner Violence: Not At Risk (05/31/2022)   Humiliation, Afraid, Rape, and Kick questionnaire    Fear of Current or Ex-Partner: No    Emotionally Abused: No    Physically Abused: No    Sexually Abused: No     Prior to Admission medications   Medication Sig Start Date End Date Taking? Authorizing Provider  albuterol  (VENTOLIN   HFA) 108 (90 Base) MCG/ACT inhaler  Inhale 2 puffs into the lungs every 4 (four) hours as needed for wheezing or shortness of breath. 06/22/21   Kozlow, Camellia PARAS, MD  ALPRAZolam  (XANAX ) 0.25 MG tablet Take 0.25 mg by mouth at bedtime as needed for sleep.     [provider]  Ascorbic Acid (VITAMIN C PO) Take 1 tablet by mouth every morning. Not sure the dosage    [provider]  azelastine  (ASTELIN ) 0.1 % nasal spray Place 1 spray into both nostrils 2 (two) times daily. 03/31/21   [provider]  budesonide -formoterol  (SYMBICORT ) 160-4.5 MCG/ACT inhaler Inhale 2 puffs into the lungs in the morning and at bedtime. 06/22/21   Kozlow, Camellia PARAS, MD  Calcium  Carbonate-Vitamin D (CALCIUM -VITAMIN D) 600-125 MG-UNIT TABS Take 1 tablet by mouth 2 (two) times daily.     [provider]  diltiazem  (CARDIZEM  CD) 180 MG 24 hr capsule TAKE 1 CAPSULE EVERY       MORNING AND AT BEDTIME 02/23/21   Fenton, Clint R, PA  ELIQUIS  5 MG TABS tablet TAKE 1 TABLET TWICE A DAY 02/23/21   Fenton, Clint R, PA  EPINEPHrine  0.3 mg/0.3 mL IJ SOAJ injection SMARTSIG:0.3 Milligram(s) IM Once 06/22/21   Kozlow, Camellia PARAS, MD  esomeprazole  (NEXIUM ) 40 MG capsule Take 1 capsule (40 mg total) by mouth 2 (two) times daily. 06/22/21   Kozlow, Camellia PARAS, MD  Ferrous Sulfate  (SLOW FE PO) Take 1 tablet by mouth every morning.    [provider]  fluorouracil (EFUDEX) 5 % cream SMARTSIG:sparingly Topical Twice Daily 05/18/21   [provider]  levocetirizine (XYZAL ) 5 MG tablet Take 1 tablet (5 mg total) by mouth 2 (two) times daily as needed for allergies (Can take an extra dose during flare ups.). 06/22/21   Kozlow, Camellia PARAS, MD  levothyroxine  (SYNTHROID , LEVOTHROID) 75 MCG tablet Take 75 mcg by mouth daily before breakfast.    [provider]  montelukast  (SINGULAIR ) 10 MG tablet Take 1 tablet (10 mg total) by mouth at bedtime. 06/22/21   Kozlow, Eric J, MD  Multiple Vitamin (MULTIVITAMIN WITH  MINERALS) TABS tablet Take 1 tablet daily by mouth.    [provider]  Multiple Vitamins-Minerals (OCUVITE PRESERVISION PO) Take 1 tablet 2 (two) times daily by mouth.    [provider]  Polyethyl Glycol-Propyl Glycol (SYSTANE OP) Apply 1 drop to eye every morning.    [provider]  PROLENSA  0.07 % SOLN INSTILL 1 DROP INTO THE    RIGHT EYE DAILY 07/19/21   Rankin, Arley LABOR, MD  Propylene Glycol 0.6 % SOLN Apply 1 drop to eye in the morning.    [provider]  rosuvastatin  (CRESTOR ) 5 MG tablet TAKE 1 TABLET DAILY 01/19/21   Fenton, Clint R, PA    Allergies  Allergen Reactions   Penicillins Hives    Has patient had a PCN reaction causing immediate rash, facial/tongue/throat swelling, SOB or lightheadedness with hypotension: No Has patient had a PCN reaction causing severe rash involving mucus membranes or skin necrosis: No Has patient had a PCN reaction that required hospitalization: No Has patient had a PCN reaction occurring within the last 10 years: Yes If all of the above answers are NO, then may proceed with Cephalosporin use.    Latex Hives and Rash   Other Rash   Sulfa Antibiotics Nausea Only    Unknown to patient    Sulfamethoxazole-Trimethoprim Nausea Only    REVIEW OF SYSTEMS:  General: no fevers/chills/night sweats Eyes:  no blurry vision, diplopia, or amaurosis ENT: no sore throat or hearing loss Resp: no cough, wheezing, or hemoptysis CV: no edema or palpitations GI: no abdominal pain, nausea, vomiting, diarrhea, or constipation GU: no dysuria, frequency, or hematuria Skin: no rash Neuro: no headache, numbness, tingling, or weakness of extremities Musculoskeletal: no joint pain or swelling Heme: no bleeding, DVT, or easy bruising Endo: no polydipsia or polyuria  BP 124/60   Pulse 78   Ht 5' 9 (1.753 m)   Wt 139 lb 3.2 oz (63.1 kg)   SpO2 99%   BMI 20.56 kg/m   PHYSICAL EXAM: GEN:  AO x 3 in no acute distress HEENT:  normal Dentition: Normal Neck: JVP normal. +2 carotid upstrokes without bruits. No thyromegaly. Lungs: equal expansion, clear bilaterally CV: Apex is discrete and nondisplaced, RRR with 4/6 SEM Abd: soft, non-tender, non-distended; no bruit; positive bowel sounds Ext: no edema, ecchymoses, or cyanosis Vascular: 2+ femoral pulses, 2+ radial pulses       Skin: warm and dry without rash Neuro: CN II-XII grossly intact; motor and sensory grossly intact    DATA AND STUDIES:  EKG: Atrial fibrillation with a right bundle branch block  2D ECHO: 2025 1. Left ventricular ejection fraction, by estimation, is 50 to 55%. The  left ventricle has low normal function. The left ventricle has no regional  wall motion abnormalities. There is moderate left ventricular hypertrophy.  Left ventricular diastolic  parameters are indeterminate. The average left ventricular global  longitudinal strain is -13.6 %. The global longitudinal strain is  abnormal.   2. Right ventricular systolic function is normal. The right ventricular  size is normal. There is normal pulmonary artery systolic pressure.   3. Left atrial size was severely dilated.   4. Right atrial size was severely dilated.   5. The mitral valve is degenerative. Mild mitral valve regurgitation. No  evidence of mitral stenosis. Moderate mitral annular calcification.   6. Compared to 04/30/33 mean gradient 37.2->52 mmHg, peak 67.2->81.4 mmHg  AVA 0.6 cm2-0.5 cm2 DVI 0.15-> 0.16 . The aortic valve is tricuspid. There  is severe calcifcation of the aortic valve. There is severe thickening of  the aortic valve. Aortic valve  regurgitation is mild. Severe aortic valve stenosis.   7. Aortic dilatation noted. There is mild dilatation of the aortic root,  measuring 38 mm.   8. The inferior vena cava is normal in size with greater than 50%  respiratory variability, suggesting right atrial pressure of 3 mmHg.   CARDIAC CATH: n/a  STS RISK CALCULATOR:  pending  NHYA CLASS: 2    ASSESSMENT AND PLAN:   Nonrheumatic aortic valve stenosis - Plan: CBC, Basic metabolic panel with GFR, EKG 12-Lead  Permanent atrial fibrillation (HCC) - Plan: CBC, Basic metabolic panel with GFR, EKG 12-Lead  Secondary hypercoagulable state (HCC) - Plan: CBC, Basic metabolic panel with GFR, EKG 12-Lead  Essential hypertension - Plan: CBC, Basic metabolic panel with GFR, EKG 12-Lead  Hyperlipidemia LDL goal <70 - Plan: CBC, Basic metabolic panel with GFR, EKG 12-Lead  Aortic atherosclerosis (HCC) - Plan: CBC, Basic metabolic panel with GFR, EKG 12-Lead  CKD (chronic kidney disease) stage 2, GFR 60-89 ml/min - Plan: CBC, Basic metabolic panel with GFR, EKG 12-Lead  Pre-procedure lab exam - Plan: CBC, Basic metabolic panel with GFR  Aortic stenosis: The patient has developed symptomatic aortic stenosis.  His mean gradient is now above 50 mmHg consistent with critical aortic stenosis.  I refer the patient  for coronary angiography, right heart catheterization, CT scan, and a cardiothoracic surgery opinion.  I did speak to him about the risk for it permanent pacemaker if TAVR is pursued given his history of atrial fibrillation and right bundle branch block.  Additionally I told the patient to contact us  if he develops any severe presyncope.  If he develops syncope I told him to pursue admission here at [facility].  The patient understands and agrees with this plan. Permanent atrial fibrillation: Continue Eliquis  5 mg twice daily and Cardizem  180 mg daily. Secondary hypercoagulable state: Continue Eliquis  5 mg twice daily. Hypertension: BP well-controlled today Hyperlipidemia: Continue Crestor  5 mg daily.  Lipids followed by PCP. Aortic atherosclerosis: Continue Eliquis  5 mg twice daily instead of aspirin  and Crestor  5 mg daily. CKD stage II: Monitor for now.  I spent 41 minutes reviewing all clinical data during and prior to this visit including all  relevant imaging studies, laboratories, clinical information from other health systems and prior notes from both Cardiology and other specialties, interviewing the patient, conducting a complete physical examination, and coordinating care in order to formulate a comprehensive and personalized evaluation and treatment plan.   Tyia Binford K Lattie Cervi, MD  11/01/2023 10:05 AM    [facility] [address] [place], [place], [place]  [address] Phone: [phone]; Fax: [phone]

## 2023-10-24 NOTE — Progress Notes (Unsigned)
 [place] - [place] - [place] - [place] - [place]   Follow-up Note  Referring Provider: Tisovec, Chad ORN, MD Primary Provider: Vernadine Chad ORN, MD Date of Office Visit: 10/24/2023  Subjective:   Chad Morrison (DOB: 07/31/1932) is a 89 y.o. male who [facility] in re-evaluation of the following:  HPI: Chad Morrison returns to this clinic in evaluation of asthma, allergic rhinoconjunctivitis, history of granulomatosis with polyangiitis, reflux, and monoclonal gammopathy.  I last saw him in this clinic on 27 December 2022.  He was seen by our nurse practitioner 12 June 2023.  He is developing some significant dyspnea on exertion especially over the course of the past month or so.  This correlates with his aortic stenosis becoming a more significant issue and there is now a discussion about moving forward with valvular replacements.  He does not really have any wheezing and coughing and he thinks his asthma is under excellent control and he rarely uses a short acting bronchodilator while he continues to use Symbicort  on a regular basis along with his benralizumab  injections.  He has had very little issues with his upper airway.  It does not sound as though he is required a systemic steroid or an antibiotic for any type of airway issue.  He believes that his reflux is under very good control on his current plan of pantoprazole  and famotidine followed by GI.  Allergies as of 10/24/2023       Reactions   Penicillins Hives   Has patient had a PCN reaction causing immediate rash, facial/tongue/throat swelling, SOB or lightheadedness with hypotension: No Has patient had a PCN reaction causing severe rash involving mucus membranes or skin necrosis: No Has patient had a PCN reaction that required hospitalization: No Has patient had a PCN reaction occurring within the last 10 years: Yes If all of the above answers are NO, then may proceed with  Cephalosporin use.   Latex Hives, Rash   Other Rash   Sulfa Antibiotics Nausea Only   Unknown to patient   Sulfamethoxazole-trimethoprim Nausea Only        Medication List        Accurate as of October 24, 2023  3:55 PM. If you have any questions, ask your nurse or doctor.          albuterol  108 (90 Base) MCG/ACT inhaler Commonly known as: VENTOLIN  HFA Inhale 2 puffs into the lungs every 4 (four) hours as needed for wheezing or shortness of breath.   ALPRAZolam  0.25 MG tablet Commonly known as: XANAX  Take 0.125 mg by mouth at bedtime.   apixaban  5 MG Tabs tablet Commonly known as: Eliquis  Take 1 tablet (5 mg total) by mouth 2 (two) times daily.   Azelastine  HCl 137 MCG/SPRAY Soln Place 2 sprays into both nostrils 2 (two) times daily.   brimonidine  0.2 % ophthalmic solution Commonly known as: ALPHAGAN  Place 1 drop into the right eye 3 (three) times daily.   Calcium -Vitamin D 600-125 MG-UNIT Tabs Take 1 tablet by mouth 2 (two) times daily.   diltiazem  180 MG 24 hr capsule Commonly known as: CARDIZEM  CD TAKE 1 CAPSULE EVERY       MORNING AND AT BEDTIME   doxycycline  100 MG capsule Commonly known as: VIBRAMYCIN  Take 100 mg by mouth 2 (two) times daily.   DOXYCYCLINE  PO Take by mouth.   EPINEPHrine  0.3 mg/0.3 mL Soaj injection Commonly known as: EPI-PEN SMARTSIG:0.3 Milligram(s) IM Once   famotidine  20 MG tablet Commonly known as: PEPCID SMARTSIG:1 Tablet(s) By Mouth Every Evening   FASENRA  [place] Inject into the skin every 8 (eight) weeks.   levocetirizine 5 MG tablet Commonly known as: XYZAL  TAKE 1 TABLET EVERY EVENING   levothyroxine  88 MCG tablet Commonly known as: SYNTHROID  Take 88 mcg by mouth daily before breakfast.   montelukast  10 MG tablet Commonly known as: SINGULAIR  Take 1 tablet (10 mg total) by mouth at bedtime.   multivitamin with minerals Tabs tablet Take 1 tablet daily by mouth.   OCUVITE PRESERVISION PO Take 1 tablet 2 (two)  times daily by mouth.   pantoprazole  40 MG tablet Commonly known as: PROTONIX  Take 40 mg by mouth every morning.   rosuvastatin  5 MG tablet Commonly known as: CRESTOR  Take 1 tablet (5 mg total) by mouth daily.   Slow Release Iron 45 MG Tbcr Generic drug: Ferrous Sulfate  Dried Take by mouth.   Symbicort  160-4.5 MCG/ACT inhaler Generic drug: budesonide -formoterol  Inhale 2 puffs into the lungs 2 (two) times daily.   SYSTANE OP Place 1 drop into both eyes daily as needed (Dry eye).   VITAMIN C PO Take 1 tablet by mouth every morning. Not sure the dosage        Past Medical History:  Diagnosis Date   Anxiety    Asthma    Asthmatic bronchitis    Atrial flutter (HCC)    chads2vasc score is 3   Ehrlichiosis 2008   GERD (gastroesophageal reflux disease)    Granulomatosis with polyangiitis (HCC)    HTN (hypertension)    Hyperlipidemia    Nuclear sclerotic cataract of left eye 06/04/2019   Cataract surgery completed March 2023, Dr. Lavonia   Paroxysmal atrial fibrillation [facility])    Prostate cancer (HCC)    Seasonal allergies     Past Surgical History:  Procedure Laterality Date   BASAL CELL CARCINOMA EXCISION  2013   EYELID   HERNIA REPAIR Bilateral 2000, 2002   KNEE ARTHROSCOPY  2000   prostectomy  1995   RADICAL   TONSILLECTOMY  1940    Review of systems negative except as noted in HPI / PMHx or noted below:  Review of Systems  Constitutional: Negative.   HENT: Negative.    Eyes: Negative.   Respiratory: Negative.    Cardiovascular: Negative.   Gastrointestinal: Negative.   Genitourinary: Negative.   Musculoskeletal: Negative.   Skin: Negative.   Neurological: Negative.   Endo/Heme/Allergies: Negative.   Psychiatric/Behavioral: Negative.       Objective:   Vitals:   10/24/23 1528  BP: 122/66  Pulse: 76  Resp: 16  Temp: 97.6 F (36.4 C)  SpO2: 97%   Height: 5' 9 (175.3 cm)  Weight: 142 lb 6.4 oz (64.6 kg)   Physical Exam Constitutional:       Appearance: He is not diaphoretic.  HENT:     Head: Normocephalic.     Right Ear: Tympanic membrane, ear canal and external ear normal.     Left Ear: Tympanic membrane, ear canal and external ear normal.     Nose: Nose normal. No mucosal edema or rhinorrhea.     Mouth/Throat:     Pharynx: Uvula midline. No oropharyngeal exudate.  Eyes:     Conjunctiva/sclera: Conjunctivae normal.  Neck:     Thyroid : No thyromegaly.     Trachea: Trachea normal. No tracheal tenderness or tracheal deviation.  Cardiovascular:     Rate and Rhythm: Normal rate and regular rhythm.  Heart sounds: S1 normal and S2 normal. Murmur (Squeaky systolic) heard.  Pulmonary:     Effort: No respiratory distress.     Breath sounds: Normal breath sounds. No stridor. No wheezing or rales.  Lymphadenopathy:     Head:     Right side of head: No tonsillar adenopathy.     Left side of head: No tonsillar adenopathy.     Cervical: No cervical adenopathy.  Skin:    Findings: No erythema or rash.     Nails: There is no clubbing.  Neurological:     Mental Status: He is alert.     Diagnostics:    Spirometry was performed and demonstrated an FEV1 of *** at *** % of predicted.  The patient had an Asthma Control Test with the following results:  .    Assessment and Plan:   1. Asthma, severe persistent, well-controlled   2. Perennial allergic rhinitis   3. LPRD (laryngopharyngeal reflux disease)   4. Nasal septal perforation   5. Monoclonal gammopathy   6. Hypogammaglobulinemia (HCC)    1.  Continue to treat and prevent inflammation:   A. Symbicort  160 - 2 inhalations 2 times per day    B. Montelukast  10 mg - 1 tablet 1 time per day  C. Benralizumab  injections     2.  Continue to treat and prevent reflux:   A. Minimize caffeine consumption   B. Pantoprazole  and famotidine. Follow up with GI   3. If needed:   A. albuterol  HFA - 2 inhalations every 4-6 hours  B. Azelastine  -1-2 sprays each nostril 1-2  times per day  4.  Blood - SPEP with immunoelectrophoresis, IgA/G/M  5.  Return to clinic in 6 months or earlier if problem  6. Influenza = Tamiflu. Covid = Paxlovid  7. Plan for fall flu vaccine  Chad Morrison is doing very well from a respiratory standpoint and her reflux standpoint and I do not see any need to change any of his therapy and I will continue on a collection of anti-inflammatory agents for his airway including anti-IL-5 receptor antibody and continue to treat his reflux aggressively as noted above.  We will check his antibody status with the blood test noted above.  I will contact him with the results of those test once they are available for review.  I will see him back in clinic in 6 months or earlier if there is a problem.  Camellia Denis, MD Allergy / Immunology [facility]

## 2023-10-25 ENCOUNTER — Encounter: Payer: Self-pay | Admitting: Allergy and Immunology

## 2023-10-26 ENCOUNTER — Encounter: Payer: Self-pay | Admitting: Allergy and Immunology

## 2023-10-26 LAB — IFE, PE AND FLC, SERUM
Albumin SerPl Elph-Mcnc: 3.6 g/dL (ref 2.9–4.4)
Albumin/Glob SerPl: 1.4 (ref 0.7–1.7)
Alpha 1: 0.3 g/dL (ref 0.0–0.4)
Alpha2 Glob SerPl Elph-Mcnc: 0.6 g/dL (ref 0.4–1.0)
B-Globulin SerPl Elph-Mcnc: 0.8 g/dL (ref 0.7–1.3)
Gamma Glob SerPl Elph-Mcnc: 1 g/dL (ref 0.4–1.8)
Globulin, Total: 2.7 g/dL (ref 2.2–3.9)
Ig Kappa Free Light Chain: 36.7 mg/L — AB (ref 3.3–19.4)
Ig Lambda Free Light Chain: 60.9 mg/L — AB (ref 5.7–26.3)
IgA/Immunoglobulin A, Serum: 72 mg/dL (ref 61–437)
IgG (Immunoglobin G), Serum: 468 mg/dL — AB (ref 603–1613)
IgM (Immunoglobulin M), Srm: 881 mg/dL — AB (ref 15–143)
KAPPA/LAMBDA RATIO: 0.6 (ref 0.26–1.65)
M Protein SerPl Elph-Mcnc: 0.3 g/dL — AB
Total Protein: 6.3 g/dL (ref 6.0–8.5)

## 2023-10-31 ENCOUNTER — Ambulatory Visit: Payer: Self-pay | Admitting: Allergy and Immunology

## 2023-11-01 ENCOUNTER — Encounter: Payer: Self-pay | Admitting: Internal Medicine

## 2023-11-01 ENCOUNTER — Ambulatory Visit: Attending: Cardiovascular Disease | Admitting: Internal Medicine

## 2023-11-01 VITALS — BP 124/60 | HR 78 | Ht 69.0 in | Wt 139.2 lb

## 2023-11-01 DIAGNOSIS — Z01812 Encounter for preprocedural laboratory examination: Secondary | ICD-10-CM | POA: Insufficient documentation

## 2023-11-01 DIAGNOSIS — I4821 Permanent atrial fibrillation: Secondary | ICD-10-CM | POA: Diagnosis not present

## 2023-11-01 DIAGNOSIS — E785 Hyperlipidemia, unspecified: Secondary | ICD-10-CM | POA: Insufficient documentation

## 2023-11-01 DIAGNOSIS — I10 Essential (primary) hypertension: Secondary | ICD-10-CM | POA: Diagnosis not present

## 2023-11-01 DIAGNOSIS — D6869 Other thrombophilia: Secondary | ICD-10-CM | POA: Insufficient documentation

## 2023-11-01 DIAGNOSIS — N182 Chronic kidney disease, stage 2 (mild): Secondary | ICD-10-CM | POA: Insufficient documentation

## 2023-11-01 DIAGNOSIS — I350 Nonrheumatic aortic (valve) stenosis: Secondary | ICD-10-CM | POA: Diagnosis not present

## 2023-11-01 DIAGNOSIS — I700 Atherosclerosis of aorta: Secondary | ICD-10-CM | POA: Insufficient documentation

## 2023-11-01 NOTE — Patient Instructions (Signed)
 Medication Instructions:  Your physician recommends that you continue on your current medications as directed. Please refer to the Current Medication list given to you today.  *If you need a refill on your cardiac medications before your next appointment, please call your pharmacy*  Lab Work: TODAY: CBC, BMET If you have labs (blood work) drawn today and your tests are completely normal, you will receive your results only by: MyChart Message (if you have MyChart) OR A paper copy in the mail If you have any lab test that is abnormal or we need to change your treatment, we will call you to review the results.  Testing/Procedures: Your physician has requested that you have a cardiac catheterization. Cardiac catheterization is used to diagnose and/or treat various heart conditions. Doctors may recommend this procedure for a number of different reasons. The most common reason is to evaluate chest pain. Chest pain can be a symptom of coronary artery disease (CAD), and cardiac catheterization can show whether plaque is narrowing or blocking your heart's arteries. This procedure is also used to evaluate the valves, as well as measure the blood flow and oxygen levels in different parts of your heart. For further information please visit [email or web address] Please follow instruction sheet, as given.  Follow-Up: At [facility], you and your health needs are our priority.  As part of our continuing mission to provide you with exceptional heart care, our providers are all part of one team.  This team includes your primary Cardiologist (physician) and Advanced Practice Providers or APPs (Physician Assistants and Nurse Practitioners) who all work together to provide you with the care you need, when you need it.  Your next appointment:   The structural team will arrange follow-up after your procedure.  Provider:   Lurena Red, MD  We recommend signing up for the patient portal called MyChart.   Sign up information is provided on this After Visit Summary.  MyChart is used to connect with patients for Virtual Visits (Telemedicine).  Patients are able to view lab/test results, encounter notes, upcoming appointments, etc.  Non-urgent messages can be sent to your provider as well.    To learn more about what you can do with MyChart, go to [email or web address].

## 2023-11-01 NOTE — Progress Notes (Signed)
 Pre Surgical Assessment: 5 M Walk Test  74M=16.7ft  5 Meter Walk Test- trial 1: 5.30 seconds 5 Meter Walk Test- trial 2: 5.61 seconds 5 Meter Walk Test- trial 3: 5.72 seconds 5 Meter Walk Test Average: 5.54 seconds

## 2023-11-02 ENCOUNTER — Telehealth: Payer: Self-pay | Admitting: *Deleted

## 2023-11-02 ENCOUNTER — Other Ambulatory Visit: Payer: Self-pay

## 2023-11-02 ENCOUNTER — Ambulatory Visit: Payer: Self-pay | Admitting: Internal Medicine

## 2023-11-02 DIAGNOSIS — I350 Nonrheumatic aortic (valve) stenosis: Secondary | ICD-10-CM

## 2023-11-02 LAB — BASIC METABOLIC PANEL WITH GFR
BUN/Creatinine Ratio: 16 (ref 10–24)
BUN: 17 mg/dL (ref 8–27)
CO2: 22 mmol/L (ref 20–29)
Calcium: 9.2 mg/dL (ref 8.6–10.2)
Chloride: 100 mmol/L (ref 96–106)
Creatinine, Ser: 1.07 mg/dL (ref 0.76–1.27)
Glucose: 80 mg/dL (ref 70–99)
Potassium: 4.4 mmol/L (ref 3.5–5.2)
Sodium: 135 mmol/L (ref 134–144)
eGFR: 66 mL/min/1.73 (ref >59)

## 2023-11-02 LAB — CBC
Hematocrit: 31.6 % — ABNORMAL LOW (ref 37.5–51.0)
Hemoglobin: 10.1 g/dL — ABNORMAL LOW (ref 13.0–17.7)
MCH: 28.8 pg (ref 26.6–33.0)
MCHC: 32 g/dL (ref 31.5–35.7)
MCV: 90 fL (ref 79–97)
Platelets: 205 x10E3/uL (ref 150–450)
RBC: 3.51 x10E6/uL — ABNORMAL LOW (ref 4.14–5.80)
RDW: 12.8 % (ref 11.6–15.4)
WBC: 4.4 x10E3/uL (ref 3.4–10.8)

## 2023-11-02 NOTE — Telephone Encounter (Signed)
 Cardiac Catheterization scheduled at [facility] for: Monday November 06, 2023 7:30 AM Arrival time [facility] Main Entrance A at: 5:30 AM  Diet: -Nothing to eat after midnight.   Hydration: -May drink clear liquids until 2 hours before the procedure.  Approved liquids: Water, clear tea, black coffee, fruit juices-non-citric and without pulp,Gatorade, plain Jello/popsicles.   -Please drink 16 oz of water 2 hours before procedure.   Medication instructions: -Hold:  Eliquis -none 11/04/23 until post procedure -Other usual morning medications can be taken including aspirin 81 mg.  Plan to go home the same day, you will only stay overnight if medically necessary.  You must have responsible adult to drive you home.  Someone must be with you the first 24 hours after you arrive home.  Reviewed procedure instructions with patient.

## 2023-11-06 ENCOUNTER — Other Ambulatory Visit: Payer: Self-pay | Admitting: Family Medicine

## 2023-11-06 ENCOUNTER — Encounter (HOSPITAL_COMMUNITY)
Admission: RE | Disposition: A | Discharge status: Home / Self Care | Payer: Self-pay | Source: Home / Self Care | Attending: Internal Medicine

## 2023-11-06 ENCOUNTER — Ambulatory Visit (HOSPITAL_COMMUNITY)
Admission: RE | Admit: 2023-11-06 | Discharge: 2023-11-06 | Disposition: A | Discharge status: Home / Self Care | Attending: Internal Medicine | Admitting: Internal Medicine

## 2023-11-06 ENCOUNTER — Encounter: Payer: Self-pay | Admitting: Internal Medicine

## 2023-11-06 ENCOUNTER — Other Ambulatory Visit: Payer: Self-pay

## 2023-11-06 DIAGNOSIS — I4821 Permanent atrial fibrillation: Secondary | ICD-10-CM | POA: Diagnosis not present

## 2023-11-06 DIAGNOSIS — E785 Hyperlipidemia, unspecified: Secondary | ICD-10-CM | POA: Insufficient documentation

## 2023-11-06 DIAGNOSIS — N182 Chronic kidney disease, stage 2 (mild): Secondary | ICD-10-CM | POA: Diagnosis not present

## 2023-11-06 DIAGNOSIS — I129 Hypertensive chronic kidney disease with stage 1 through stage 4 chronic kidney disease, or unspecified chronic kidney disease: Secondary | ICD-10-CM | POA: Diagnosis not present

## 2023-11-06 DIAGNOSIS — D6869 Other thrombophilia: Secondary | ICD-10-CM | POA: Diagnosis not present

## 2023-11-06 DIAGNOSIS — I2510 Atherosclerotic heart disease of native coronary artery without angina pectoris: Secondary | ICD-10-CM | POA: Diagnosis not present

## 2023-11-06 DIAGNOSIS — Z87891 Personal history of nicotine dependence: Secondary | ICD-10-CM | POA: Diagnosis not present

## 2023-11-06 DIAGNOSIS — I350 Nonrheumatic aortic (valve) stenosis: Secondary | ICD-10-CM

## 2023-11-06 DIAGNOSIS — I700 Atherosclerosis of aorta: Secondary | ICD-10-CM | POA: Insufficient documentation

## 2023-11-06 HISTORY — PX: RIGHT/LEFT HEART CATH AND CORONARY ANGIOGRAPHY: CATH118266

## 2023-11-06 LAB — POCT I-STAT EG7
Acid-base deficit: 3 mmol/L — ABNORMAL HIGH (ref 0.0–2.0)
Acid-base deficit: 7 mmol/L — ABNORMAL HIGH (ref 0.0–2.0)
Bicarbonate: 18.9 mmol/L — ABNORMAL LOW (ref 20.0–28.0)
Bicarbonate: 22.6 mmol/L (ref 20.0–28.0)
Calcium, Ion: 0.98 mmol/L — ABNORMAL LOW (ref 1.15–1.40)
Calcium, Ion: 1.2 mmol/L (ref 1.15–1.40)
HCT: 26 % — ABNORMAL LOW (ref 39.0–52.0)
HCT: 29 % — ABNORMAL LOW (ref 39.0–52.0)
Hemoglobin: 8.8 g/dL — ABNORMAL LOW (ref 13.0–17.0)
Hemoglobin: 9.9 g/dL — ABNORMAL LOW (ref 13.0–17.0)
O2 Saturation: 61 %
O2 Saturation: 70 %
Potassium: 3.2 mmol/L — ABNORMAL LOW (ref 3.5–5.1)
Potassium: 3.9 mmol/L (ref 3.5–5.1)
Sodium: 126 mmol/L — ABNORMAL LOW (ref 135–145)
Sodium: 127 mmol/L — ABNORMAL LOW (ref 135–145)
TCO2: 20 mmol/L — ABNORMAL LOW (ref 22–32)
TCO2: 24 mmol/L (ref 22–32)
pCO2, Ven: 37 mmHg — ABNORMAL LOW (ref 44–60)
pCO2, Ven: 41.1 mmHg — ABNORMAL LOW (ref 44–60)
pH, Ven: 7.316 (ref 7.25–7.43)
pH, Ven: 7.348 (ref 7.25–7.43)
pO2, Ven: 34 mmHg (ref 32–45)
pO2, Ven: 38 mmHg (ref 32–45)

## 2023-11-06 LAB — POCT I-STAT 7, (LYTES, BLD GAS, ICA,H+H)
Acid-base deficit: 4 mmol/L — ABNORMAL HIGH (ref 0.0–2.0)
Bicarbonate: 21 mmol/L (ref 20.0–28.0)
Calcium, Ion: 1.18 mmol/L (ref 1.15–1.40)
HCT: 28 % — ABNORMAL LOW (ref 39.0–52.0)
Hemoglobin: 9.5 g/dL — ABNORMAL LOW (ref 13.0–17.0)
O2 Saturation: 91 %
Potassium: 3.8 mmol/L (ref 3.5–5.1)
Sodium: 127 mmol/L — ABNORMAL LOW (ref 135–145)
TCO2: 22 mmol/L (ref 22–32)
pCO2 arterial: 35.3 mmHg (ref 32–48)
pH, Arterial: 7.383 (ref 7.35–7.45)
pO2, Arterial: 62 mmHg — ABNORMAL LOW (ref 83–108)

## 2023-11-06 SURGERY — RIGHT/LEFT HEART CATH AND CORONARY ANGIOGRAPHY
Anesthesia: LOCAL

## 2023-11-06 MED ORDER — SODIUM CHLORIDE 0.9 % IV SOLN
250.0000 mL | INTRAVENOUS | Status: DC | PRN
Start: 2023-11-06 — End: 2023-11-06

## 2023-11-06 MED ORDER — ASPIRIN 81 MG PO CHEW: 81.0000 mg | CHEWABLE_TABLET | ORAL | Status: AC

## 2023-11-06 MED ORDER — FENTANYL CITRATE (PF) 100 MCG/2ML IJ SOLN
INTRAMUSCULAR | Status: AC
  Filled 2023-11-06: qty 2

## 2023-11-06 MED ORDER — HEPARIN SODIUM (PORCINE) 1000 UNIT/ML IJ SOLN
INTRAMUSCULAR | Status: AC
  Filled 2023-11-06: qty 10

## 2023-11-06 MED ORDER — SODIUM CHLORIDE 0.9% FLUSH: 3.0000 mL | INTRAVENOUS | Status: DC | PRN

## 2023-11-06 MED ORDER — FREE WATER: 500.0000 mL | Freq: Once | Status: DC

## 2023-11-06 MED ORDER — MIDAZOLAM HCL 2 MG/2ML IJ SOLN
INTRAMUSCULAR | Status: DC | PRN
  Administered 2023-11-06: 1 mg via INTRAVENOUS

## 2023-11-06 MED ORDER — BIVALIRUDIN TRIFLUOROACETATE 250 MG IV SOLR
INTRAVENOUS | Status: AC
  Filled 2023-11-06: qty 250

## 2023-11-06 MED ORDER — IOHEXOL 350 MG/ML SOLN
INTRAVENOUS | Status: DC | PRN
  Administered 2023-11-06: 75 mL via INTRA_ARTERIAL

## 2023-11-06 MED ORDER — MIDAZOLAM HCL 2 MG/2ML IJ SOLN
INTRAMUSCULAR | Status: AC
  Filled 2023-11-06: qty 2

## 2023-11-06 MED ORDER — LIDOCAINE HCL (PF) 1 % IJ SOLN
INTRAMUSCULAR | Status: AC
  Filled 2023-11-06: qty 30

## 2023-11-06 MED ORDER — LIDOCAINE HCL (PF) 1 % IJ SOLN
INTRAMUSCULAR | Status: DC | PRN
  Administered 2023-11-06: 5 mL

## 2023-11-06 MED ORDER — VERAPAMIL HCL 2.5 MG/ML IV SOLN
INTRAVENOUS | Status: DC | PRN
  Administered 2023-11-06: 5 mL via INTRA_ARTERIAL

## 2023-11-06 MED ORDER — HEPARIN SODIUM (PORCINE) 1000 UNIT/ML IJ SOLN
INTRAMUSCULAR | Status: DC | PRN
  Administered 2023-11-06: 5000 [IU] via INTRAVENOUS

## 2023-11-06 MED ORDER — SODIUM CHLORIDE 0.9% FLUSH: 3.0000 mL | Freq: Two times a day (BID) | INTRAVENOUS | Status: DC

## 2023-11-06 MED ORDER — HEPARIN (PORCINE) IN NACL 1000-0.9 UT/500ML-% IV SOLN
INTRAVENOUS | Status: DC | PRN
  Administered 2023-11-06 (×2): 500 mL

## 2023-11-06 MED ORDER — VERAPAMIL HCL 2.5 MG/ML IV SOLN
INTRAVENOUS | Status: AC
  Filled 2023-11-06: qty 2

## 2023-11-06 MED ORDER — FENTANYL CITRATE (PF) 100 MCG/2ML IJ SOLN
INTRAMUSCULAR | Status: DC | PRN
  Administered 2023-11-06: 25 ug via INTRAVENOUS

## 2023-11-06 SURGICAL SUPPLY — 13 items
CATH BALLN WEDGE 5F 110CM (CATHETERS) IMPLANT
CATH INFINITI 5FR ANG PIGTAIL (CATHETERS) IMPLANT
CATH INFINITI AMBI 6FR TG (CATHETERS) IMPLANT
DEVICE RAD COMP TR BAND LRG (VASCULAR PRODUCTS) IMPLANT
GLIDESHEATH SLEND SS 6F .021 (SHEATH) IMPLANT
GUIDEWIRE INQWIRE 1.5J.035X260 (WIRE) IMPLANT
PACK CARDIAC CATHETERIZATION (CUSTOM PROCEDURE TRAY) ×1 IMPLANT
SET ATX-X65L (MISCELLANEOUS) IMPLANT
SHEATH GLIDE SLENDER 4/5FR (SHEATH) IMPLANT
SHEATH PROBE COVER 6X72 (BAG) IMPLANT
WIRE EMERALD 3MM-J .035X260CM (WIRE) IMPLANT
WIRE MICRO SET SILHO 5FR 7 (SHEATH) IMPLANT
WIRE MICROINTRODUCER 60CM (WIRE) IMPLANT

## 2023-11-06 NOTE — Discharge Instructions (Addendum)
 Restart Eliquis  on 11/07/23  Drink plenty of fluids for 48 hours and keep wrist elevated at heart level for 24 hours  Radial Site Care   This sheet gives you information about how to care for yourself after your procedure. Your health care provider may also give you more specific instructions. If you have problems or questions, contact your health care provider. What can I expect after the procedure? After the procedure, it is common to have: Bruising and tenderness at the catheter insertion area. Follow these instructions at home: Medicines Take over-the-counter and prescription medicines only as told by your health care provider. Insertion site care Follow instructions from your health care provider about how to take care of your insertion site. Make sure you: Wash your hands with soap and water  before you change your bandage (dressing). If soap and water  are not available, use hand sanitizer. Remove your dressing as told by your health care provider. In 24 hours Check your insertion site every day for signs of infection. Check for: Redness, swelling, or pain. Fluid or blood. Pus or a bad smell. Warmth. Do not take baths, swim, or use a hot tub until your health care provider approves. You may shower 24-48 hours after the procedure, or as directed by your health care provider. Remove the dressing and gently wash the site with plain soap and water . Pat the area dry with a clean towel. Do not rub the site. That could cause bleeding. Do not apply powder or lotion to the site. Activity   For 24 hours after the procedure, or as directed by your health care provider: Do not flex or bend the affected arm. Do not push or pull heavy objects with the affected arm. Do not drive yourself home from the hospital or clinic. You may drive 24 hours after the procedure unless your health care provider tells you not to. Do not operate machinery or power tools. Do not lift anything that is heavier  than 10 lb (4.5 kg), or the limit that you are told, until your health care provider says that it is safe.  For 4 days Ask your health care provider when it is okay to: Return to work or school. Resume usual physical activities or sports. Resume sexual activity. General instructions If the catheter site starts to bleed, raise your arm and put firm pressure on the site. If the bleeding does not stop, get help right away. This is a medical emergency. If you went home on the same day as your procedure, a responsible adult should be with you for the first 24 hours after you arrive home. Keep all follow-up visits as told by your health care provider. This is important. Contact a health care provider if: You have a fever. You have redness, swelling, or yellow drainage around your insertion site. Get help right away if: You have unusual pain at the radial site. The catheter insertion area swells very fast. The insertion area is bleeding, and the bleeding does not stop when you hold steady pressure on the area. Your arm or hand becomes pale, cool, tingly, or numb. These symptoms may represent a serious problem that is an emergency. Do not wait to see if the symptoms will go away. Get medical help right away. Call your local emergency services (911 in the [place] Do not drive yourself to the hospital. Summary After the procedure, it is common to have bruising and tenderness at the site. Follow instructions from your health care provider about how  to take care of your radial site wound. Check the wound every day for signs of infection. Do not lift anything that is heavier than 10 lb (4.5 kg), or the limit that you are told, until your health care provider says that it is safe. This information is not intended to replace advice given to you by your health care provider. Make sure you discuss any questions you have with your health care provider. Document Revised: 03/22/2017 Document Reviewed:  03/22/2017 Elsevier Patient Education  2020 [employer]   internal jugularSite Care   This sheet gives you information about how to care for yourself after your procedure. Your health care provider may also give you more specific instructions. If you have problems or questions, contact your health care provider. What can I expect after the procedure? After the procedure, it is common to have: Bruising and tenderness at the catheter insertion area. Follow these instructions at home:  Insertion site care Follow instructions from your health care provider about how to take care of your insertion site. Make sure you: Wash your hands with soap and water  before you change your bandage (dressing). If soap and water  are not available, use hand sanitizer. Remove your dressing as told by your health care provider. In 24 hours Check your insertion site every day for signs of infection. Check for: Redness, swelling, or pain. Pus or a bad smell. Warmth. You may shower 24-48 hours after the procedure. Do not apply powder or lotion to the site.  Activity For 24 hours after the procedure, or as directed by your health care provider: Do not push or pull heavy objects with the affected arm. Do not drive yourself home from the hospital or clinic. You may drive 24 hours after the procedure unless your health care provider tells you not to. Do not lift anything that is heavier than 10 lb (4.5 kg), or the limit that you are told, until your health care provider says that it is safe.  For 24 hours

## 2023-11-06 NOTE — Progress Notes (Signed)
 TRB band removed no S/S of complication at incision site. PT ambulated to the bathroom where he was able to void without difficulty. Discharge instructions reviewed wit patient and wife Chad Morrison, denies questions or concerns at this time. Pt was escorted from the unit via wheel chair to personal vehicle.

## 2023-11-06 NOTE — Interval H&P Note (Signed)
 History and Physical Interval Note:  11/06/2023 6:38 AM  Chad Morrison  has presented today for surgery, with the diagnosis of aortic stenosis.  The various methods of treatment have been discussed with the patient and family. After consideration of risks, benefits and other options for treatment, the patient has consented to  Procedure(s): RIGHT/LEFT HEART CATH AND CORONARY ANGIOGRAPHY (N/A) as a surgical intervention.  The patient's history has been reviewed, patient examined, no change in status, stable for surgery.  I have reviewed the patient's chart and labs.  Questions were answered to the patient's satisfaction.     Meldon Hanzlik K Travonte Byard

## 2023-11-07 ENCOUNTER — Encounter (HOSPITAL_COMMUNITY): Payer: Self-pay | Admitting: Internal Medicine

## 2023-11-09 ENCOUNTER — Ambulatory Visit

## 2023-11-10 ENCOUNTER — Ambulatory Visit: Payer: Self-pay | Admitting: Internal Medicine

## 2023-11-10 ENCOUNTER — Ambulatory Visit (HOSPITAL_COMMUNITY)
Admission: RE | Admit: 2023-11-10 | Discharge: 2023-11-10 | Disposition: A | Discharge status: Home / Self Care | Source: Ambulatory Visit | Attending: Internal Medicine | Admitting: Internal Medicine

## 2023-11-10 ENCOUNTER — Ambulatory Visit

## 2023-11-10 DIAGNOSIS — I2510 Atherosclerotic heart disease of native coronary artery without angina pectoris: Secondary | ICD-10-CM | POA: Diagnosis not present

## 2023-11-10 DIAGNOSIS — I77811 Abdominal aortic ectasia: Secondary | ICD-10-CM | POA: Diagnosis not present

## 2023-11-10 DIAGNOSIS — Z01810 Encounter for preprocedural cardiovascular examination: Secondary | ICD-10-CM | POA: Insufficient documentation

## 2023-11-10 DIAGNOSIS — K8020 Calculus of gallbladder without cholecystitis without obstruction: Secondary | ICD-10-CM | POA: Diagnosis not present

## 2023-11-10 DIAGNOSIS — I350 Nonrheumatic aortic (valve) stenosis: Secondary | ICD-10-CM | POA: Insufficient documentation

## 2023-11-10 DIAGNOSIS — K551 Chronic vascular disorders of intestine: Secondary | ICD-10-CM | POA: Diagnosis not present

## 2023-11-10 DIAGNOSIS — R918 Other nonspecific abnormal finding of lung field: Secondary | ICD-10-CM | POA: Insufficient documentation

## 2023-11-10 MED ORDER — IOHEXOL 350 MG/ML SOLN
100.0000 mL | Freq: Once | INTRAVENOUS | Status: AC | PRN
  Administered 2023-11-10: 100 mL via INTRAVENOUS

## 2023-11-10 NOTE — Progress Notes (Signed)
 Procedure Type: Isolated AVR Perioperative Outcome Estimate % Operative Mortality 4.41% Morbidity & Mortality 12.1% Stroke 3.12% Renal Failure 3.12% Reoperation 3.72% Prolonged Ventilation 5.19% Deep Sternal Wound Infection 0.037% Long Hospital Stay (>14 days) 7.71% Short Hospital Stay (<6 days)* 19.3%

## 2023-11-11 ENCOUNTER — Other Ambulatory Visit: Payer: Self-pay | Admitting: Internal Medicine

## 2023-11-13 ENCOUNTER — Ambulatory Visit: Admitting: Internal Medicine

## 2023-11-14 ENCOUNTER — Telehealth: Payer: Self-pay | Admitting: Allergy and Immunology

## 2023-11-14 NOTE — Telephone Encounter (Signed)
 Gene called and stated that the pharmacy will not refill his script for montelukast  (SINGULAIR ) 10 MG tablet [[id number]].   In his chart it shows he has a refill, but he is stating the pharmacy said they can not fill it.

## 2023-11-15 MED ORDER — MONTELUKAST SODIUM 10 MG PO TABS: 10.0000 mg | ORAL_TABLET | Freq: Every evening | ORAL | 1 refills | Status: AC

## 2023-11-15 NOTE — Addendum Note (Signed)
 Addended by: Trisa Cranor E on: 11/15/2023 04:58 PM   Modules accepted: Orders

## 2023-11-15 NOTE — Progress Notes (Unsigned)
 [address]       [place] [address]             [phone]        [name] Medical Record #[id number] Date of Birth: 06-Oct-1932  Referring: Vernadine Charlie ORN, MD Primary Care: Tisovec, Charlie ORN, MD Primary Cardiologist:Arun MARLA Red, MD  Chief Complaint:   No chief complaint on file.   History of Present Illness:     Chad Morrison is a 89 y.o. male presents for surgical evaluation of ***      Past Medical History:  Diagnosis Date   Anxiety    Asthma    Asthmatic bronchitis    Atrial flutter (HCC)    chads2vasc score is 3   Ehrlichiosis 2008   GERD (gastroesophageal reflux disease)    Granulomatosis with polyangiitis (HCC)    HTN (hypertension)    Hyperlipidemia    Nuclear sclerotic cataract of left eye 06/04/2019   Cataract surgery completed March 2023, Dr. Lavonia   Paroxysmal atrial fibrillation [facility])    Prostate cancer [facility])    Seasonal allergies     Past Surgical History:  Procedure Laterality Date   BASAL CELL CARCINOMA EXCISION  2013   EYELID   HERNIA REPAIR Bilateral 2000, 2002   KNEE ARTHROSCOPY  2000   prostectomy  1995   RADICAL   RIGHT/LEFT HEART CATH AND CORONARY ANGIOGRAPHY N/A 11/06/2023   Procedure: RIGHT/LEFT HEART CATH AND CORONARY ANGIOGRAPHY;  Surgeon: Red Lurena MARLA, MD;  Location: [facility] INV[facility] CV LAB;  Service: Cardiovascular;  Laterality: N/A;   TONSILLECTOMY  1940    Social History:  Social History   Tobacco Use  Smoking Status Former   Current packs/day: 0.00   Average packs/day: 0.5 packs/day for 4.0 years (2.0 ttl pk-yrs)   Types: Cigarettes   Start date: 02/28/1957   Quit date: 02/28/1961   Years since quitting: 62.7  Smokeless Tobacco Never  Tobacco Comments   Former smoker 06/20/22    Social History   Substance and Sexual Activity  Alcohol  Use Yes   Alcohol /week: 2.0 - 3.0 standard drinks of alcohol    Types: 2 - 3 Standard drinks or equivalent per week     Allergies  Allergen  Reactions   Penicillins Hives    Has patient had a PCN reaction causing immediate rash, facial/tongue/throat swelling, SOB or lightheadedness with hypotension: No Has patient had a PCN reaction causing severe rash involving mucus membranes or skin necrosis: No Has patient had a PCN reaction that required hospitalization: No Has patient had a PCN reaction occurring within the last 10 years: Yes If all of the above answers are NO, then may proceed with Cephalosporin use.    Latex Hives and Rash   Sulfa Antibiotics Nausea Only        Sulfamethoxazole-Trimethoprim Nausea Only      Current Outpatient Medications  Medication Sig Dispense Refill   acetaminophen  (TYLENOL ) 500 MG tablet Take 500 mg by mouth every 6 (six) hours as needed for moderate pain (pain score 4-6).     albuterol  (VENTOLIN  HFA) 108 (90 Base) MCG/ACT inhaler Inhale 2 puffs into the lungs every 4 (four) hours as needed for wheezing or shortness of breath. 18 g 1   ALPRAZolam  (XANAX ) 0.25 MG tablet Take 0.125 mg by mouth at bedtime as needed for sleep.     apixaban  (ELIQUIS ) 5 MG TABS tablet Take 1 tablet (5 mg total) by mouth 2 (two)  times daily. 180 tablet 0   Ascorbic Acid (VITAMIN C PO) Take 1 tablet by mouth every morning.     Azelastine  HCl 137 MCG/SPRAY SOLN Place 2 sprays into both nostrils 2 (two) times daily. 90 mL 1   Benralizumab  (FASENRA  Reasnor) Inject into the skin every 8 (eight) weeks.     brimonidine  (ALPHAGAN ) 0.2 % ophthalmic solution Place 1 drop into the right eye 2 (two) times daily.     Calcium  Carbonate-Vitamin D (CALCIUM -VITAMIN D) 600-125 MG-UNIT TABS Take 1 tablet by mouth 2 (two) times daily.      diltiazem  (CARDIZEM  CD) 180 MG 24 hr capsule TAKE 1 CAPSULE EVERY       MORNING AND AT BEDTIME 180 capsule 3   diphenhydrAMINE  (BENADRYL ) 25 MG tablet Take 25 mg by mouth daily as needed for allergies.     EPINEPHrine  0.3 mg/0.3 mL IJ SOAJ injection SMARTSIG:0.3 Milligram(s) IM Once (Patient taking  differently: Inject 0.3 mg into the muscle as needed for anaphylaxis. SMARTSIG:0.3 Milligram(s) IM Once) 1 each 1   famotidine (PEPCID) 20 MG tablet Take 20 mg by mouth at bedtime.     Ferrous Sulfate  Dried (SLOW RELEASE IRON) 45 MG TBCR Take 45 mg by mouth daily.     levocetirizine (XYZAL ) 5 MG tablet TAKE 1 TABLET EVERY EVENING 90 tablet 1   levothyroxine  (SYNTHROID ) 88 MCG tablet Take 88 mcg by mouth daily before breakfast.     montelukast  (SINGULAIR ) 10 MG tablet Take 1 tablet (10 mg total) by mouth at bedtime. 90 tablet 1   Multiple Vitamin (MULTIVITAMIN WITH MINERALS) TABS tablet Take 1 tablet daily by mouth.     Multiple Vitamins-Minerals (OCUVITE PRESERVISION PO) Take 1 tablet 2 (two) times daily by mouth.     pantoprazole  (PROTONIX ) 40 MG tablet Take 40 mg by mouth every morning.     Polyethyl Glycol-Propyl Glycol (SYSTANE OP) Place 1 drop into both eyes daily as needed (Dry eye).     rosuvastatin  (CRESTOR ) 5 MG tablet Take 1 tablet (5 mg total) by mouth daily. 90 tablet 2   SYMBICORT  160-4.5 MCG/ACT inhaler Inhale 2 puffs into the lungs 2 (two) times daily. 30.6 g 1   Current Facility-Administered Medications  Medication Dose Route Frequency Provider Last Rate Last Admin   Benralizumab  SOSY 30 mg  30 mg Subcutaneous Q8 Weeks Kozlow, Eric J, MD   30 mg at 10/24/23 1447    (Not in a hospital admission)   Family History  Problem Relation Age of Onset   Asthma Mother 37   Allergies Mother    Ulcers Mother    CVA Father 39   Heart Problems Father    Other Father        NEUROLOGIC ISSUES    Other Brother 8       BICYCLE ACCIDENT   Migraines Son    Diabetes Mellitus I Son      Review of Systems:   ROS    Physical Exam: There were no vitals taken for this visit. Physical Exam    Cardiac Studies & Procedures   ______________________________________________________________________________________________ CARDIAC CATHETERIZATION  CARDIAC CATHETERIZATION  11/06/2023  Conclusion   RPAV lesion is 40% stenosed.  1.  Mild, nonobstructive coronary artery disease. 2.  Fick cardiac output of 8.2 L/min and Fick cardiac index of 4.6 L/min/m with following hemodynamics: Right atrial pressure mean of 10 mmHg Right ventricular pressure 45/5 with an end-diastolic pressure of 9 mmHg Wedge pressure mean of 23 mmHg the V waves  to 31 mmHg PA pressure 43/19 with a mean of 29 mmHg PVR 0.7 Woods units PA pulsatility index of 2.4 3.  Capacious iliofemoral vessels bilaterally; the femoral bifurcations are at the inferior margin of the femoral heads bilaterally.  Recommendation: Continue evaluation for aortic valve intervention.  Findings Coronary Findings Diagnostic  Dominance: Right  Left Anterior Descending There is mild diffuse disease throughout the vessel.  Left Circumflex There is mild diffuse disease throughout the vessel.  First Obtuse Marginal Branch There is mild disease in the vessel.  Right Coronary Artery There is mild diffuse disease throughout the vessel.  Right Posterior Descending Artery There is mild disease in the vessel.  Right Posterior Atrioventricular Artery There is mild disease in the vessel. RPAV lesion is 40% stenosed.  Intervention  No interventions have been documented.     ECHOCARDIOGRAM  ECHOCARDIOGRAM COMPLETE 10/23/2023  Narrative ECHOCARDIOGRAM REPORT    Patient Name:   Chad Morrison   Date of Exam: 10/23/2023 Medical Rec #:  [id number]     Height:       69.0 in Accession #:    [id number]    Weight:       139.8 lb Date of Birth:  October 01, 1932      BSA:          1.774 m Patient Age:    89 years      BP:           146/75 mmHg Patient Gender: M             HR:           76 bpm. Exam Location:  [address]  Procedure: 2D Echo and 3D Echo (Both Spectral and Color Flow Doppler were utilized during procedure).  Indications:    I35.0 Aortic Stenosis  History:        Patient has prior history of  Echocardiogram examinations. CKD, Arrythmias:Atrial Fibrillation, Signs/Symptoms:Shortness of Breath; Risk Factors:Hypertension and HLD.  Sonographer:    Waldo Guadalajara RCS Referring Phys: [id number] ARUN K THUKKANI  IMPRESSIONS   1. Left ventricular ejection fraction, by estimation, is 50 to 55%. The left ventricle has low normal function. The left ventricle has no regional wall motion abnormalities. There is moderate left ventricular hypertrophy. Left ventricular diastolic parameters are indeterminate. The average left ventricular global longitudinal strain is -13.6 %. The global longitudinal strain is abnormal. 2. Right ventricular systolic function is normal. The right ventricular size is normal. There is normal pulmonary artery systolic pressure. 3. Left atrial size was severely dilated. 4. Right atrial size was severely dilated. 5. The mitral valve is degenerative. Mild mitral valve regurgitation. No evidence of mitral stenosis. Moderate mitral annular calcification. 6. Compared to 04/30/33 mean gradient 37.2->52 mmHg, peak 67.2->81.4 mmHg AVA 0.6 cm2-0.5 cm2 DVI 0.15-> 0.16 . The aortic valve is tricuspid. There is severe calcifcation of the aortic valve. There is severe thickening of the aortic valve. Aortic valve regurgitation is mild. Severe aortic valve stenosis. 7. Aortic dilatation noted. There is mild dilatation of the aortic root, measuring 38 mm. 8. The inferior vena cava is normal in size with greater than 50% respiratory variability, suggesting right atrial pressure of 3 mmHg.  FINDINGS Left Ventricle: Left ventricular ejection fraction, by estimation, is 50 to 55%. The left ventricle has low normal function. The left ventricle has no regional wall motion abnormalities. The average left ventricular global longitudinal strain is -13.6 %. Strain was performed and the global longitudinal strain is abnormal. The left  ventricular internal cavity size was normal in size. There is  moderate left ventricular hypertrophy. Left ventricular diastolic parameters are indeterminate.  Right Ventricle: The right ventricular size is normal. No increase in right ventricular wall thickness. Right ventricular systolic function is normal. There is normal pulmonary artery systolic pressure. The tricuspid regurgitant velocity is 2.35 m/s, and with an assumed right atrial pressure of 3 mmHg, the estimated right ventricular systolic pressure is 25.1 mmHg.  Left Atrium: Left atrial size was severely dilated.  Right Atrium: Right atrial size was severely dilated.  Pericardium: There is no evidence of pericardial effusion.  Mitral Valve: The mitral valve is degenerative in appearance. There is mild thickening of the mitral valve leaflet(s). There is mild calcification of the mitral valve leaflet(s). Moderate mitral annular calcification. Mild mitral valve regurgitation. No evidence of mitral valve stenosis.  Tricuspid Valve: The tricuspid valve is normal in structure. Tricuspid valve regurgitation is mild . No evidence of tricuspid stenosis.  Aortic Valve: Compared to 04/30/33 mean gradient 37.2->52 mmHg, peak 67.2->81.4 mmHg AVA 0.6 cm2-0.5 cm2 DVI 0.15-> 0.16. The aortic valve is tricuspid. There is severe calcifcation of the aortic valve. There is severe thickening of the aortic valve. Aortic valve regurgitation is mild. Aortic regurgitation PHT measures 316 msec. Severe aortic stenosis is present. Aortic valve mean gradient measures 52.0 mmHg. Aortic valve peak gradient measures 81.4 mmHg. Aortic valve area, by VTI measures 0.45 cm.  Pulmonic Valve: The pulmonic valve was normal in structure. Pulmonic valve regurgitation is not visualized. No evidence of pulmonic stenosis.  Aorta: Aortic dilatation noted. There is mild dilatation of the aortic root, measuring 38 mm.  Venous: The inferior vena cava is normal in size with greater than 50% respiratory variability, suggesting right atrial  pressure of 3 mmHg.  IAS/Shunts: No atrial level shunt detected by color flow Doppler.  Additional Comments: 3D was performed not requiring image post processing on an independent workstation and was abnormal.   LEFT VENTRICLE PLAX 2D LVIDd:         4.40 cm   Diastology LVIDs:         3.20 cm   LV e' medial:    8.05 cm/s LV PW:         1.30 cm   LV E/e' medial:  10.3 LV IVS:        1.30 cm   LV e' lateral:   9.79 cm/s LVOT diam:     1.90 cm   LV E/e' lateral: 8.5 LV SV:         54 LV SV Index:   30        2D Longitudinal Strain LVOT Area:     2.84 cm  2D Strain GLS Avg:     -13.6 %  3D Volume EF: 3D EF:        50 % LV EDV:       166 ml LV ESV:       83 ml LV SV:        83 ml  RIGHT VENTRICLE RV Basal diam:  4.00 cm RV S prime:     10.10 cm/s TAPSE (M-mode): 1.7 cm RVSP:           25.1 mmHg  LEFT ATRIUM             Index        RIGHT ATRIUM           Index LA diam:  5.30 cm 2.99 cm/m   RA Pressure: 3.00 mmHg LA Vol (A2C):   92.2 ml 51.99 ml/m  RA Area:     26.30 cm LA Vol (A4C):   65.2 ml 36.72 ml/m  RA Volume:   85.50 ml  48.19 ml/m LA Biplane Vol: 76.8 ml 43.28 ml/m AORTIC VALVE AV Area (Vmax):    0.46 cm AV Area (Vmean):   0.44 cm AV Area (VTI):     0.45 cm AV Vmax:           451.00 cm/s AV Vmean:          338.000 cm/s AV VTI:            1.210 m AV Peak Grad:      81.4 mmHg AV Mean Grad:      52.0 mmHg LVOT Vmax:         73.73 cm/s LVOT Vmean:        52.500 cm/s LVOT VTI:          0.190 m LVOT/AV VTI ratio: 0.16 AI PHT:            316 msec  AORTA Ao Root diam: 3.80 cm Ao Asc diam:  3.60 cm  MITRAL VALVE                  TRICUSPID VALVE MV Area (PHT):                TR Peak grad:   22.1 mmHg MV Decel Time:                TR Vmax:        235.00 cm/s MR Peak grad:    112.4 mmHg   Estimated RAP:  3.00 mmHg MR Mean grad:    79.0 mmHg    RVSP:           25.1 mmHg MR Vmax:         530.00 cm/s MR Vmean:        422.0 cm/s   SHUNTS MR PISA:          1.57 cm     Systemic VTI:  0.19 m MR PISA Eff ROA: 9 mm        Systemic Diam: 1.90 cm MR PISA Radius:  0.50 cm MV E velocity: 83.05 cm/s  Maude Emmer MD Electronically signed by Maude Emmer MD Signature Date/Time: 10/23/2023/1:24:50 PM    Final      CT SCANS  CT CORONARY MORPH W/CTA COR W/SCORE 11/10/2023  Narrative CLINICAL DATA:  Severe Aortic Stenosis.  EXAM: Cardiac TAVR CT  TECHNIQUE: A non-contrast, gated CT scan was obtained with axial slices of 2.5 mm through the heart for aortic valve scoring. A 120 kV retrospective, gated, contrast cardiac scan was obtained. Gantry rotation speed was 230 msec and collimation was 0.63 mm. Nitroglycerin was not given. A delayed scan was obtained to exclude left atrial appendage thrombus. The 3D dataset was reconstructed in systole with motion correction. The 3D data set was reconstructed in 5% intervals of the 0-95% of the R-R cycle. Systolic and diastolic phases were analyzed on a dedicated workstation using MPR, MIP, and VRT modes. The patient received 100 cc of contrast.  FINDINGS: Aortic Valve: Valve Morphology: Tricuspid with symmetric bulky calcification and reduced excursion the planimeter valve area is 1.07 Sq cm consistent with severe aortic stenosis.  Annular and subannular calcification: None.  Aortic Valve Calcium  score: 5222  Presence of basal septal hypertrophy: Severe.  Systolic annulus is greater that diastolic and will be used for sizing.  Perimembranous septal diameter: 5 mm.  Mitral Valve: Moderate mitral annular calcification with thickening and normal mitral valve area.  Aortic Annulus Measurements-30% Phase  Major annulus diameter: 28 mm  Minor annulus diameter:24 mm  Annular perimeter: 82 cm  Annular area: 5.23 cm2  Aortic Measurements- 75% phase  Sinotubular Junction: 31 mm  Ascending Thoracic Aorta: 40 mm - mild dilation.  Descending Thoracic Aorta: 26 mm  Aortic  atherosclerosis.  Sinus of Valsalva Measurements:  Right coronary cusp width: 36 mm  Left coronary cusp width: 37 mm  Non coronary cusp width: 36 mm  Coronary Artery Height above Annulus:  Left Main: 14 mm  Left SoV height: 21 mm  Right Coronary: 15 mm  Right SoV height: 21 mm  Optimum Fluoroscopic Angle for Delivery: LAO 7, CAU 7  Cusp overlay view angle: RAO 0, CAU 15  Valves for structural team consideration:  29 mm Sapien Valve is preferred in the setting of no annular calcification. Scene Save acquired.  Sized to the lower limit of a 34 mm Evolut Valve (81.7 mm perimeter is the transition point).  Non TAVR Valve Findings:  Coronary Arteries: Normal coronary origin. Study not completed with nitroglycerin.  Coronary Calcium  score: 5136  Systemic veins: Normal anatomy  Main Pulmonary artery: Normal caliber  Pulmonary veins: Normal anatomy.  Left atrial appendage: Patent.  Interatrial septum: No communication  Chamber dimensions: Bi-atrial dilation.  Pericardium: No calcifications.  Extra Cardiac Findings as per separate reporting.  Image quality: Good.  Noise artifact is related to aortic motion.  IMPRESSION: 1. Severe Aortic stenosis. Measurements for potential interventions as above.  Chad Leavens MD   Electronically Signed By: Chad Morrison M.D. On: 11/10/2023 14:39     ______________________________________________________________________________________________      ECG ***    I have independently reviewed the above radiologic studies and discussed with the patient   Recent Lab Findings: Lab Results  Component Value Date   WBC 4.4 11/01/2023   HGB 8.8 (L) 11/06/2023   HCT 26.0 (L) 11/06/2023   PLT 205 11/01/2023   GLUCOSE 80 11/01/2023   ALT 16 05/30/2022   AST 22 05/30/2022   NA 126 (L) 11/06/2023   K 3.2 (L) 11/06/2023   CL 100 11/01/2023   CREATININE 1.07 11/01/2023   BUN 17 11/01/2023   CO2 22  11/01/2023   TSH 5.815 (H) 05/31/2022   INR 1.4 (H) 04/12/2022      Assessment / Plan:   89 y.o. male with severe aortic stenosis.  STS score: ***.  NYHA Class ***.  The risks and benefits of *** TAVR were discussed in detail.  We also discussed possibility of an emergent sternotomy to address any procedural complications.  Based on our discussion, we collectively decided that an emergent sternotomy would not be indicated.  The patient is *** agreeable to proceed.  Based on my review of her LHC, echo, and CTA, I agree with the multidisciplinary plan to proceed with a *** TAVR.      I  spent {CHL ONC TIME VISIT - DTPQU:[id number]} counseling the patient face to face.   Linnie MALVA Rayas 11/15/2023 5:07 PM

## 2023-11-16 ENCOUNTER — Ambulatory Visit: Admitting: Internal Medicine

## 2023-11-17 ENCOUNTER — Encounter: Payer: Self-pay | Admitting: Thoracic Surgery (Cardiothoracic Vascular Surgery)

## 2023-11-17 ENCOUNTER — Ambulatory Visit
Attending: Thoracic Surgery (Cardiothoracic Vascular Surgery) | Admitting: Thoracic Surgery (Cardiothoracic Vascular Surgery)

## 2023-11-17 VITALS — BP 139/75 | HR 84 | Resp 18 | Ht 69.0 in | Wt 141.1 lb

## 2023-11-17 DIAGNOSIS — I350 Nonrheumatic aortic (valve) stenosis: Secondary | ICD-10-CM | POA: Insufficient documentation

## 2023-11-20 DIAGNOSIS — E871 Hypo-osmolality and hyponatremia: Secondary | ICD-10-CM | POA: Diagnosis not present

## 2023-11-20 DIAGNOSIS — J849 Interstitial pulmonary disease, unspecified: Secondary | ICD-10-CM | POA: Diagnosis not present

## 2023-11-20 DIAGNOSIS — R2689 Other abnormalities of gait and mobility: Secondary | ICD-10-CM | POA: Diagnosis not present

## 2023-11-20 DIAGNOSIS — Z7901 Long term (current) use of anticoagulants: Secondary | ICD-10-CM | POA: Diagnosis not present

## 2023-11-20 DIAGNOSIS — G2581 Restless legs syndrome: Secondary | ICD-10-CM | POA: Diagnosis not present

## 2023-11-20 DIAGNOSIS — R946 Abnormal results of thyroid function studies: Secondary | ICD-10-CM | POA: Diagnosis not present

## 2023-11-20 DIAGNOSIS — Z1389 Encounter for screening for other disorder: Secondary | ICD-10-CM | POA: Diagnosis not present

## 2023-11-20 DIAGNOSIS — I4892 Unspecified atrial flutter: Secondary | ICD-10-CM | POA: Diagnosis not present

## 2023-11-20 DIAGNOSIS — R29898 Other symptoms and signs involving the musculoskeletal system: Secondary | ICD-10-CM | POA: Diagnosis not present

## 2023-11-27 DIAGNOSIS — J849 Interstitial pulmonary disease, unspecified: Secondary | ICD-10-CM | POA: Diagnosis not present

## 2023-11-27 DIAGNOSIS — R2689 Other abnormalities of gait and mobility: Secondary | ICD-10-CM | POA: Diagnosis not present

## 2023-11-27 DIAGNOSIS — Z23 Encounter for immunization: Secondary | ICD-10-CM | POA: Diagnosis not present

## 2023-11-27 DIAGNOSIS — R29898 Other symptoms and signs involving the musculoskeletal system: Secondary | ICD-10-CM | POA: Diagnosis not present

## 2023-11-27 DIAGNOSIS — G2581 Restless legs syndrome: Secondary | ICD-10-CM | POA: Diagnosis not present

## 2023-11-27 DIAGNOSIS — Z7901 Long term (current) use of anticoagulants: Secondary | ICD-10-CM | POA: Diagnosis not present

## 2023-11-27 DIAGNOSIS — I4892 Unspecified atrial flutter: Secondary | ICD-10-CM | POA: Diagnosis not present

## 2023-11-27 DIAGNOSIS — E871 Hypo-osmolality and hyponatremia: Secondary | ICD-10-CM | POA: Diagnosis not present

## 2023-12-04 ENCOUNTER — Other Ambulatory Visit: Payer: Self-pay | Admitting: Internal Medicine

## 2023-12-04 DIAGNOSIS — I4821 Permanent atrial fibrillation: Secondary | ICD-10-CM

## 2023-12-05 NOTE — Telephone Encounter (Signed)
 Prescription refill request for Eliquis  received. Indication:afib Last office visit:9/25 Scr:1.07  9/25 Age: 89 Weight:64  kg  Prescription refilled

## 2023-12-06 ENCOUNTER — Other Ambulatory Visit: Payer: Self-pay

## 2023-12-06 ENCOUNTER — Telehealth: Payer: Self-pay | Admitting: Internal Medicine

## 2023-12-06 DIAGNOSIS — I4821 Permanent atrial fibrillation: Secondary | ICD-10-CM

## 2023-12-06 MED ORDER — APIXABAN 5 MG PO TABS
5.0000 mg | ORAL_TABLET | Freq: Two times a day (BID) | ORAL | 1 refills | Status: DC
Start: 2023-12-06 — End: 2023-12-07

## 2023-12-06 NOTE — Telephone Encounter (Signed)
 Prescription refill request for Eliquis  received. Indication:afib Last office visit:9/25 Scr:1.07  9/25 Age: 89 Weight:64  kg  Prescription refilled

## 2023-12-06 NOTE — Telephone Encounter (Signed)
°*  STAT* If patient is at the pharmacy, call can be transferred to refill team. ° ° °1. Which medications need to be refilled? (please list name of each medication and dose if known) apixaban (ELIQUIS) 5 MG TABS tablet ° °2. Which pharmacy/location (including street and city if local pharmacy) is medication to be sent to? [facility] - [place], [place] - [address] AT Portal to Registered [facility] Sites ° °3. Do they need a 30 day or 90 day supply? 90   °

## 2023-12-07 ENCOUNTER — Other Ambulatory Visit: Payer: Self-pay

## 2023-12-07 DIAGNOSIS — I4821 Permanent atrial fibrillation: Secondary | ICD-10-CM

## 2023-12-07 MED ORDER — APIXABAN 5 MG PO TABS: 5.0000 mg | ORAL_TABLET | Freq: Two times a day (BID) | ORAL | 1 refills | Status: AC

## 2023-12-07 NOTE — Telephone Encounter (Signed)
*  STAT* If patient is at the pharmacy, call can be transferred to refill team.   1. Which medications need to be refilled? (please list name of each medication and dose if known) apixaban  (ELIQUIS ) 5 MG TABS tablet    2. Would you like to learn more about the convenience, safety, & potential cost savings by using the [facility]?    3. Are you open to using the [facility] (Type [facility].  ).   4. Which pharmacy/location (including street and city if local pharmacy) is medication to be sent to?[id number] - [place], [place] - [address] AT [address]    5. Do they need a 30 day or 90 day supply? 90 day   Pt would like to cancel the mail order

## 2023-12-11 DIAGNOSIS — M3130 Wegener's granulomatosis without renal involvement: Secondary | ICD-10-CM | POA: Diagnosis not present

## 2023-12-11 DIAGNOSIS — Z7901 Long term (current) use of anticoagulants: Secondary | ICD-10-CM | POA: Diagnosis not present

## 2023-12-11 DIAGNOSIS — E039 Hypothyroidism, unspecified: Secondary | ICD-10-CM | POA: Diagnosis not present

## 2023-12-11 DIAGNOSIS — J849 Interstitial pulmonary disease, unspecified: Secondary | ICD-10-CM | POA: Diagnosis not present

## 2023-12-11 DIAGNOSIS — K2960 Other gastritis without bleeding: Secondary | ICD-10-CM | POA: Diagnosis not present

## 2023-12-11 DIAGNOSIS — J4540 Moderate persistent asthma, uncomplicated: Secondary | ICD-10-CM | POA: Diagnosis not present

## 2023-12-11 DIAGNOSIS — N393 Stress incontinence (female) (male): Secondary | ICD-10-CM | POA: Diagnosis not present

## 2023-12-11 DIAGNOSIS — I4892 Unspecified atrial flutter: Secondary | ICD-10-CM | POA: Diagnosis not present

## 2023-12-11 DIAGNOSIS — H9190 Unspecified hearing loss, unspecified ear: Secondary | ICD-10-CM | POA: Diagnosis not present

## 2023-12-11 DIAGNOSIS — Z8546 Personal history of malignant neoplasm of prostate: Secondary | ICD-10-CM | POA: Diagnosis not present

## 2023-12-11 DIAGNOSIS — I350 Nonrheumatic aortic (valve) stenosis: Secondary | ICD-10-CM | POA: Diagnosis not present

## 2023-12-11 DIAGNOSIS — E7800 Pure hypercholesterolemia, unspecified: Secondary | ICD-10-CM | POA: Diagnosis not present

## 2023-12-12 ENCOUNTER — Ambulatory Visit: Admitting: Allergy and Immunology

## 2023-12-12 DIAGNOSIS — H353122 Nonexudative age-related macular degeneration, left eye, intermediate dry stage: Secondary | ICD-10-CM | POA: Diagnosis not present

## 2023-12-12 DIAGNOSIS — H353211 Exudative age-related macular degeneration, right eye, with active choroidal neovascularization: Secondary | ICD-10-CM | POA: Diagnosis not present

## 2023-12-12 DIAGNOSIS — H4061X2 Glaucoma secondary to drugs, right eye, moderate stage: Secondary | ICD-10-CM | POA: Diagnosis not present

## 2023-12-12 DIAGNOSIS — H4041X0 Glaucoma secondary to eye inflammation, right eye, stage unspecified: Secondary | ICD-10-CM | POA: Diagnosis not present

## 2023-12-12 DIAGNOSIS — H30041 Focal chorioretinal inflammation, macular or paramacular, right eye: Secondary | ICD-10-CM | POA: Diagnosis not present

## 2023-12-12 DIAGNOSIS — H353212 Exudative age-related macular degeneration, right eye, with inactive choroidal neovascularization: Secondary | ICD-10-CM | POA: Diagnosis not present

## 2023-12-12 DIAGNOSIS — H35351 Cystoid macular degeneration, right eye: Secondary | ICD-10-CM | POA: Diagnosis not present

## 2023-12-12 DIAGNOSIS — H35371 Puckering of macula, right eye: Secondary | ICD-10-CM | POA: Diagnosis not present

## 2023-12-12 DIAGNOSIS — H353114 Nonexudative age-related macular degeneration, right eye, advanced atrophic with subfoveal involvement: Secondary | ICD-10-CM | POA: Diagnosis not present

## 2023-12-12 DIAGNOSIS — H04122 Dry eye syndrome of left lacrimal gland: Secondary | ICD-10-CM | POA: Diagnosis not present

## 2023-12-12 NOTE — Progress Notes (Unsigned)
 [facility]   MULTIDISCIPLINARY HEART VALVE CLINIC                                     Cardiology Office Note:    Date:  12/14/2023   ID:  Chad Morrison, DOB 09-17-1932, MRN [id number]  PCP:  Vernadine Charlie ORN, MD  [facility] Cardiologist:  Arun K Thukkani, MD  [facility] Structural heart: None [facility] Electrophysiologist:  None   Referring MD: Vernadine Charlie ORN, MD   Follow up AS- TAVR 12/26/23  History of Present Illness:    Chad Morrison is a 89 y.o. male with a hx of HTN, HDL, permanent atrial fibrillation on Eliquis , Wegener's granulomatosis (on immunotherapy and low dose prednisone ), CKD stage II, limited mobility d/t bilateral hammertoes, aortic atherosclerosis and critical AS who presents to clinic to discuss upcoming TAVR on 12/26/23.  He has been followed by Dr. Wendel for aortic stenosis but remained asymptomatic and quite active. He was admitted in 05/2022 with LE edema and profound hyponatremic thought to be due to inadequately treated hypothyroidism. He was administered IV levothyroxine  with improvement in his edema and sodium levels. He continued to do well but recently developed progressive exertional dyspnea and fatigue. Echo 10/23/23 showed EF 50%, and critical AS with mean grad 52 mmHg, AVA 0.45 cm2, DVI 0.16, SVI 30 and mild AI, TR & MR. [facility] 11/06/23 showed mild, nonobstructive coronary artery disease. Fick cardiac output of 8.2 L/min and Fick cardiac index of 4.6 L/min/m with following hemodynamics: Right atrial pressure mean of 10 mmHg Right ventricular pressure 45/5 with an end-diastolic pressure of 9 mmHg. Wedge pressure mean of 23 mmHg the V waves to 31 mmHg PA pressure 43/19 with a mean of 29 mmHg PVR 0.7 Woods units PA pulsatility index of 2.4 11/10/23 cardiac gated CTA of the heart revealed anatomical characteristics consistent with aortic stenosis suitable for treatment by transcatheter aortic valve replacement without any significant  complicating features and CTA of the aorta and iliac vessels demonstrated what appear to be adequate pelvic vascular access to facilitate a transfemoral approach. It was planned to proceed with a 26mm Edwards Sapien via the right TF approach on 12/26/23.  Today the patient presents to clinic for follow up. No CP. Has exertional SOB with moderate exertion. Cannot walk his normal full circle around his neighborhood. Has some leg weakness. No LE edema, orthopnea or PND. No dizziness or syncope. No blood in stool or urine. No palpitations. He still works out at [employer] but cannot to as much as he used to.   Past Medical History:  Diagnosis Date   Anxiety    Asthma    Asthmatic bronchitis    Atrial flutter (HCC)    chads2vasc score is 3   Ehrlichiosis 2008   GERD (gastroesophageal reflux disease)    Granulomatosis with polyangiitis (HCC)    HTN (hypertension)    Hyperlipidemia    Nuclear sclerotic cataract of left eye 06/04/2019   Cataract surgery completed March 2023, Dr. Lavonia   Paroxysmal atrial fibrillation [facility])    Prostate cancer (HCC)    Seasonal allergies      Current Medications: Current Meds  Medication Sig   acetaminophen  (TYLENOL ) 500 MG tablet Take 500 mg by mouth every 6 (six) hours as needed for moderate pain (pain score 4-6).   albuterol  (VENTOLIN  HFA) 108 (90 Base) MCG/ACT inhaler Inhale 2 puffs  into the lungs every 4 (four) hours as needed for wheezing or shortness of breath.   ALPRAZolam  (XANAX ) 0.25 MG tablet Take 0.125 mg by mouth at bedtime as needed for sleep.   apixaban  (ELIQUIS ) 5 MG TABS tablet Take 1 tablet (5 mg total) by mouth 2 (two) times daily.   Ascorbic Acid (VITAMIN C PO) Take 1 tablet by mouth every morning.   Azelastine  HCl 137 MCG/SPRAY SOLN Place 2 sprays into both nostrils 2 (two) times daily.   Benralizumab  (FASENRA  [place]) Inject into the skin every 8 (eight) weeks.   brimonidine  (ALPHAGAN ) 0.2 % ophthalmic solution Place 1 drop into the right eye 2  (two) times daily.   Calcium  Carbonate-Vitamin D (CALCIUM -VITAMIN D) 600-125 MG-UNIT TABS Take 1 tablet by mouth 2 (two) times daily.    diltiazem  (CARDIZEM  CD) 180 MG 24 hr capsule TAKE 1 CAPSULE EVERY       MORNING AND AT BEDTIME   diphenhydrAMINE  (BENADRYL ) 25 MG tablet Take 25 mg by mouth daily as needed for allergies.   EPINEPHrine  0.3 mg/0.3 mL IJ SOAJ injection SMARTSIG:0.3 Milligram(s) IM Once (Patient taking differently: Inject 0.3 mg into the muscle as needed for anaphylaxis. SMARTSIG:0.3 Milligram(s) IM Once)   famotidine (PEPCID) 20 MG tablet Take 20 mg by mouth at bedtime.   Ferrous Sulfate  Dried (SLOW RELEASE IRON) 45 MG TBCR Take 45 mg by mouth daily.   levocetirizine (XYZAL ) 5 MG tablet TAKE 1 TABLET EVERY EVENING   levothyroxine  (SYNTHROID ) 88 MCG tablet Take 88 mcg by mouth daily before breakfast.   montelukast  (SINGULAIR ) 10 MG tablet Take 1 tablet (10 mg total) by mouth at bedtime.   Multiple Vitamin (MULTIVITAMIN WITH MINERALS) TABS tablet Take 1 tablet daily by mouth.   Multiple Vitamins-Minerals (OCUVITE PRESERVISION PO) Take 1 tablet 2 (two) times daily by mouth.   pantoprazole  (PROTONIX ) 40 MG tablet Take 40 mg by mouth every morning.   Polyethyl Glycol-Propyl Glycol (SYSTANE OP) Place 1 drop into both eyes daily as needed (Dry eye).   rosuvastatin  (CRESTOR ) 5 MG tablet Take 1 tablet (5 mg total) by mouth daily.   SYMBICORT  160-4.5 MCG/ACT inhaler Inhale 2 puffs into the lungs 2 (two) times daily.   Current Facility-Administered Medications for the 12/14/23 encounter (Office Visit) with Sebastian Lamarr SAUNDERS, PA-C  Medication   Benralizumab  SOSY 30 mg      ROS:   Please see the history of present illness.    All other systems reviewed and are negative.  EKGs       Risk Assessment/Calculations:    CHA2DS2-VASc Score =     This indicates a  % annual risk of stroke. The patient's score is based upon:           Physical Exam:    VS:  BP 126/60   Pulse  83   Ht 5' 9 (1.753 m)   Wt 136 lb 9.6 oz (62 kg)   SpO2 99%   BMI 20.17 kg/m     Wt Readings from Last 3 Encounters:  12/14/23 136 lb 9.6 oz (62 kg)  11/17/23 141 lb 1.6 oz (64 kg)  11/06/23 140 lb (63.5 kg)     GEN: Well nourished, well developed in no acute distress NECK: No JVD CARDIAC: RRR, 3/6 harsh SEM at R/LUSB. No rubs, gallops RESPIRATORY:  Clear to auscultation without rales, wheezing or rhonchi  ABDOMEN: Soft, non-tender, non-distended EXTREMITIES:  No edema; No deformity.   ASSESSMENT:    1. Aortic stenosis, severe  2. Permanent atrial fibrillation (HCC)   3. Essential hypertension   4. Hyperlipidemia LDL goal <70   5. RBBB     PLAN:    In order of problems listed above:  Severe AS:  -- Echo 10/23/23 showed EF 50%, and critical AS with mean grad 52 mmHg, AVA 0.45 cm2, DVI 0.16, SVI 30 and mild AI, TR & MR. -- Symptomatic with NYHA class II symptoms and cannot walk his normal circle.  -- Plan for TAVR with a 26mm Edwards Sapien via the right TF approach on 12/26/23.  Permanent atrial fibrillation:  -- Continue Eliquis  5 mg twice daily and Cardizem  180 mg daily.  Hypertension:  -- BP well-controlled today. -- Continue Cardizem  180 mg daily.  Hyperlipidemia:  -- Continue Crestor  5 mg daily.   -- Lipids followed by PCP.   RBBB: -- Increased risk for HAVB after TAVR requiring a PPM. -- He understands this may lengthen his hospital stay.    Medication Adjustments/Labs and Tests Ordered: Current medicines are reviewed at length with the patient today.  Concerns regarding medicines are outlined above.  No orders of the defined types were placed in this encounter.  No orders of the defined types were placed in this encounter.   There are no Patient Instructions on file for this visit.   Signed, Lamarr Hummer, PA-C  12/14/2023 4:00 PM    [facility]

## 2023-12-12 NOTE — H&P (View-Only) (Signed)
 [facility]   MULTIDISCIPLINARY HEART VALVE CLINIC                                     Cardiology Office Note:    Date:  12/14/2023   ID:  [name], DOB 09-17-1932, MRN [id number]  PCP:  Vernadine Charlie ORN, MD  [facility] Cardiologist:  Arun K Thukkani, MD  [facility] Structural heart: None [facility] Electrophysiologist:  None   Referring MD: Vernadine Charlie ORN, MD   Follow up AS- TAVR 12/26/23  History of Present Illness:    Chad Morrison is a 89 y.o. male with a hx of HTN, HDL, permanent atrial fibrillation on Eliquis , Wegener's granulomatosis (on immunotherapy and low dose prednisone ), CKD stage II, limited mobility d/t bilateral hammertoes, aortic atherosclerosis and critical AS who presents to clinic to discuss upcoming TAVR on 12/26/23.  He has been followed by Dr. Wendel for aortic stenosis but remained asymptomatic and quite active. He was admitted in 05/2022 with LE edema and profound hyponatremic thought to be due to inadequately treated hypothyroidism. He was administered IV levothyroxine  with improvement in his edema and sodium levels. He continued to do well but recently developed progressive exertional dyspnea and fatigue. Echo 10/23/23 showed EF 50%, and critical AS with mean grad 52 mmHg, AVA 0.45 cm2, DVI 0.16, SVI 30 and mild AI, TR & MR. [facility] 11/06/23 showed mild, nonobstructive coronary artery disease. Fick cardiac output of 8.2 L/min and Fick cardiac index of 4.6 L/min/m with following hemodynamics: Right atrial pressure mean of 10 mmHg Right ventricular pressure 45/5 with an end-diastolic pressure of 9 mmHg. Wedge pressure mean of 23 mmHg the V waves to 31 mmHg PA pressure 43/19 with a mean of 29 mmHg PVR 0.7 Woods units PA pulsatility index of 2.4 11/10/23 cardiac gated CTA of the heart revealed anatomical characteristics consistent with aortic stenosis suitable for treatment by transcatheter aortic valve replacement without any significant  complicating features and CTA of the aorta and iliac vessels demonstrated what appear to be adequate pelvic vascular access to facilitate a transfemoral approach. It was planned to proceed with a 26mm Edwards Sapien via the right TF approach on 12/26/23.  Today the patient presents to clinic for follow up. No CP. Has exertional SOB with moderate exertion. Cannot walk his normal full circle around his neighborhood. Has some leg weakness. No LE edema, orthopnea or PND. No dizziness or syncope. No blood in stool or urine. No palpitations. He still works out at [employer] but cannot to as much as he used to.   Past Medical History:  Diagnosis Date   Anxiety    Asthma    Asthmatic bronchitis    Atrial flutter (HCC)    chads2vasc score is 3   Ehrlichiosis 2008   GERD (gastroesophageal reflux disease)    Granulomatosis with polyangiitis (HCC)    HTN (hypertension)    Hyperlipidemia    Nuclear sclerotic cataract of left eye 06/04/2019   Cataract surgery completed March 2023, Dr. Lavonia   Paroxysmal atrial fibrillation [facility])    Prostate cancer (HCC)    Seasonal allergies      Current Medications: Current Meds  Medication Sig   acetaminophen  (TYLENOL ) 500 MG tablet Take 500 mg by mouth every 6 (six) hours as needed for moderate pain (pain score 4-6).   albuterol  (VENTOLIN  HFA) 108 (90 Base) MCG/ACT inhaler Inhale 2 puffs  into the lungs every 4 (four) hours as needed for wheezing or shortness of breath.   ALPRAZolam  (XANAX ) 0.25 MG tablet Take 0.125 mg by mouth at bedtime as needed for sleep.   apixaban  (ELIQUIS ) 5 MG TABS tablet Take 1 tablet (5 mg total) by mouth 2 (two) times daily.   Ascorbic Acid (VITAMIN C PO) Take 1 tablet by mouth every morning.   Azelastine  HCl 137 MCG/SPRAY SOLN Place 2 sprays into both nostrils 2 (two) times daily.   Benralizumab  (FASENRA  [place]) Inject into the skin every 8 (eight) weeks.   brimonidine  (ALPHAGAN ) 0.2 % ophthalmic solution Place 1 drop into the right eye 2  (two) times daily.   Calcium  Carbonate-Vitamin D (CALCIUM -VITAMIN D) 600-125 MG-UNIT TABS Take 1 tablet by mouth 2 (two) times daily.    diltiazem  (CARDIZEM  CD) 180 MG 24 hr capsule TAKE 1 CAPSULE EVERY       MORNING AND AT BEDTIME   diphenhydrAMINE  (BENADRYL ) 25 MG tablet Take 25 mg by mouth daily as needed for allergies.   EPINEPHrine  0.3 mg/0.3 mL IJ SOAJ injection SMARTSIG:0.3 Milligram(s) IM Once (Patient taking differently: Inject 0.3 mg into the muscle as needed for anaphylaxis. SMARTSIG:0.3 Milligram(s) IM Once)   famotidine (PEPCID) 20 MG tablet Take 20 mg by mouth at bedtime.   Ferrous Sulfate  Dried (SLOW RELEASE IRON) 45 MG TBCR Take 45 mg by mouth daily.   levocetirizine (XYZAL ) 5 MG tablet TAKE 1 TABLET EVERY EVENING   levothyroxine  (SYNTHROID ) 88 MCG tablet Take 88 mcg by mouth daily before breakfast.   montelukast  (SINGULAIR ) 10 MG tablet Take 1 tablet (10 mg total) by mouth at bedtime.   Multiple Vitamin (MULTIVITAMIN WITH MINERALS) TABS tablet Take 1 tablet daily by mouth.   Multiple Vitamins-Minerals (OCUVITE PRESERVISION PO) Take 1 tablet 2 (two) times daily by mouth.   pantoprazole  (PROTONIX ) 40 MG tablet Take 40 mg by mouth every morning.   Polyethyl Glycol-Propyl Glycol (SYSTANE OP) Place 1 drop into both eyes daily as needed (Dry eye).   rosuvastatin  (CRESTOR ) 5 MG tablet Take 1 tablet (5 mg total) by mouth daily.   SYMBICORT  160-4.5 MCG/ACT inhaler Inhale 2 puffs into the lungs 2 (two) times daily.   Current Facility-Administered Medications for the 12/14/23 encounter (Office Visit) with Sebastian Lamarr SAUNDERS, PA-C  Medication   Benralizumab  SOSY 30 mg      ROS:   Please see the history of present illness.    All other systems reviewed and are negative.  EKGs       Risk Assessment/Calculations:    CHA2DS2-VASc Score =     This indicates a  % annual risk of stroke. The patient's score is based upon:           Physical Exam:    VS:  BP 126/60   Pulse  83   Ht 5' 9 (1.753 m)   Wt 136 lb 9.6 oz (62 kg)   SpO2 99%   BMI 20.17 kg/m     Wt Readings from Last 3 Encounters:  12/14/23 136 lb 9.6 oz (62 kg)  11/17/23 141 lb 1.6 oz (64 kg)  11/06/23 140 lb (63.5 kg)     GEN: Well nourished, well developed in no acute distress NECK: No JVD CARDIAC: RRR, 3/6 harsh SEM at R/LUSB. No rubs, gallops RESPIRATORY:  Clear to auscultation without rales, wheezing or rhonchi  ABDOMEN: Soft, non-tender, non-distended EXTREMITIES:  No edema; No deformity.   ASSESSMENT:    1. Aortic stenosis, severe  2. Permanent atrial fibrillation (HCC)   3. Essential hypertension   4. Hyperlipidemia LDL goal <70   5. RBBB     PLAN:    In order of problems listed above:  Severe AS:  -- Echo 10/23/23 showed EF 50%, and critical AS with mean grad 52 mmHg, AVA 0.45 cm2, DVI 0.16, SVI 30 and mild AI, TR & MR. -- Symptomatic with NYHA class II symptoms and cannot walk his normal circle.  -- Plan for TAVR with a 26mm Edwards Sapien via the right TF approach on 12/26/23.  Permanent atrial fibrillation:  -- Continue Eliquis  5 mg twice daily and Cardizem  180 mg daily.  Hypertension:  -- BP well-controlled today. -- Continue Cardizem  180 mg daily.  Hyperlipidemia:  -- Continue Crestor  5 mg daily.   -- Lipids followed by PCP.   RBBB: -- Increased risk for HAVB after TAVR requiring a PPM. -- He understands this may lengthen his hospital stay.    Medication Adjustments/Labs and Tests Ordered: Current medicines are reviewed at length with the patient today.  Concerns regarding medicines are outlined above.  No orders of the defined types were placed in this encounter.  No orders of the defined types were placed in this encounter.   There are no Patient Instructions on file for this visit.   Signed, Lamarr Hummer, PA-C  12/14/2023 4:00 PM    [facility]

## 2023-12-13 ENCOUNTER — Other Ambulatory Visit: Payer: Self-pay

## 2023-12-13 DIAGNOSIS — I350 Nonrheumatic aortic (valve) stenosis: Secondary | ICD-10-CM

## 2023-12-14 ENCOUNTER — Ambulatory Visit: Attending: Physician Assistant | Admitting: Physician Assistant

## 2023-12-14 ENCOUNTER — Ambulatory Visit: Admitting: Physician Assistant

## 2023-12-14 VITALS — BP 126/60 | HR 83 | Ht 69.0 in | Wt 136.6 lb

## 2023-12-14 DIAGNOSIS — I4510 Unspecified right bundle-branch block: Secondary | ICD-10-CM | POA: Insufficient documentation

## 2023-12-14 DIAGNOSIS — I4821 Permanent atrial fibrillation: Secondary | ICD-10-CM | POA: Diagnosis not present

## 2023-12-14 DIAGNOSIS — E785 Hyperlipidemia, unspecified: Secondary | ICD-10-CM | POA: Insufficient documentation

## 2023-12-14 DIAGNOSIS — I350 Nonrheumatic aortic (valve) stenosis: Secondary | ICD-10-CM | POA: Diagnosis not present

## 2023-12-14 DIAGNOSIS — I10 Essential (primary) hypertension: Secondary | ICD-10-CM | POA: Diagnosis not present

## 2023-12-15 ENCOUNTER — Encounter: Payer: Self-pay | Admitting: Internal Medicine

## 2023-12-19 ENCOUNTER — Ambulatory Visit: Admitting: Family

## 2023-12-19 ENCOUNTER — Ambulatory Visit (INDEPENDENT_AMBULATORY_CARE_PROVIDER_SITE_OTHER)

## 2023-12-19 DIAGNOSIS — J4550 Severe persistent asthma, uncomplicated: Secondary | ICD-10-CM | POA: Diagnosis not present

## 2023-12-22 ENCOUNTER — Encounter (HOSPITAL_COMMUNITY)
Admission: RE | Admit: 2023-12-22 | Discharge: 2023-12-22 | Disposition: A | Discharge status: Home / Self Care | Source: Ambulatory Visit | Attending: Internal Medicine | Admitting: Internal Medicine

## 2023-12-22 ENCOUNTER — Ambulatory Visit (HOSPITAL_COMMUNITY)
Admission: RE | Admit: 2023-12-22 | Discharge: 2023-12-22 | Disposition: A | Discharge status: Home / Self Care | Source: Ambulatory Visit | Attending: Internal Medicine | Admitting: Internal Medicine

## 2023-12-22 ENCOUNTER — Other Ambulatory Visit: Payer: Self-pay

## 2023-12-22 DIAGNOSIS — I350 Nonrheumatic aortic (valve) stenosis: Secondary | ICD-10-CM

## 2023-12-22 DIAGNOSIS — Z01818 Encounter for other preprocedural examination: Secondary | ICD-10-CM

## 2023-12-22 DIAGNOSIS — J929 Pleural plaque without asbestos: Secondary | ICD-10-CM | POA: Diagnosis not present

## 2023-12-22 DIAGNOSIS — J449 Chronic obstructive pulmonary disease, unspecified: Secondary | ICD-10-CM | POA: Diagnosis not present

## 2023-12-22 LAB — COMPREHENSIVE METABOLIC PANEL WITH GFR
ALT: 17 U/L (ref 0–44)
AST: 22 U/L (ref 15–41)
Albumin: 3.6 g/dL (ref 3.5–5.0)
Alkaline Phosphatase: 50 U/L (ref 38–126)
Anion gap: 8 (ref 5–15)
BUN: 20 mg/dL (ref 8–23)
CO2: 24 mmol/L (ref 22–32)
Calcium: 9 mg/dL (ref 8.9–10.3)
Chloride: 101 mmol/L (ref 98–111)
Creatinine, Ser: 1.15 mg/dL (ref 0.61–1.24)
GFR, Estimated: >60 mL/min (ref >60)
Glucose, Bld: 104 mg/dL — ABNORMAL HIGH (ref 70–99)
Potassium: 3.6 mmol/L (ref 3.5–5.1)
Sodium: 133 mmol/L — ABNORMAL LOW (ref 135–145)
Total Bilirubin: 0.9 mg/dL (ref 0.0–1.2)
Total Protein: 6.8 g/dL (ref 6.5–8.1)

## 2023-12-22 LAB — URINALYSIS, ROUTINE W REFLEX MICROSCOPIC
Bacteria, UA: NONE SEEN
Bilirubin Urine: NEGATIVE
Glucose, UA: NEGATIVE mg/dL
Hgb urine dipstick: NEGATIVE
Ketones, ur: NEGATIVE mg/dL
Leukocytes,Ua: NEGATIVE
Nitrite: NEGATIVE
Protein, ur: 30 mg/dL — AB
Specific Gravity, Urine: 1.016 (ref 1.005–1.030)
pH: 5 (ref 5.0–8.0)

## 2023-12-22 LAB — CBC
HCT: 33.7 % — ABNORMAL LOW (ref 39.0–52.0)
Hemoglobin: 11.1 g/dL — ABNORMAL LOW (ref 13.0–17.0)
MCH: 28.9 pg (ref 26.0–34.0)
MCHC: 32.9 g/dL (ref 30.0–36.0)
MCV: 87.8 fL (ref 80.0–100.0)
Platelets: 166 K/uL (ref 150–400)
RBC: 3.84 MIL/uL — ABNORMAL LOW (ref 4.22–5.81)
RDW: 14 % (ref 11.5–15.5)
WBC: 4.5 K/uL (ref 4.0–10.5)
nRBC: 0 % (ref 0.0–0.2)

## 2023-12-22 LAB — TYPE AND SCREEN
ABO/RH(D): O POS
Antibody Screen: NEGATIVE

## 2023-12-22 LAB — PROTIME-INR
INR: 1.3 — ABNORMAL HIGH (ref 0.8–1.2)
Prothrombin Time: 17 s — ABNORMAL HIGH (ref 11.4–15.2)

## 2023-12-22 LAB — SURGICAL PCR SCREEN
MRSA, PCR: NEGATIVE
Staphylococcus aureus: NEGATIVE

## 2023-12-22 NOTE — Progress Notes (Signed)
 All consents signed by patient at PAT lab appointment. Pt was sent home with printed copy of surgical instructions and CHG soap/CHG soap instructions. All instructions reviewed with patient and questions answered.  Patients chart send to anesthesia for review. Pt denies any respiratory illness/infection in the last two months.

## 2023-12-25 MED ORDER — HEPARIN 30,000 UNITS/1000 ML (OHS) CELLSAVER SOLUTION
Status: DC
Start: 2023-12-26 — End: 2023-12-27
  Filled 2023-12-25: qty 1000

## 2023-12-25 MED ORDER — CEFAZOLIN SODIUM-DEXTROSE 2-4 GM/100ML-% IV SOLN
2.0000 g | INTRAVENOUS | Status: AC
Start: 2023-12-26 — End: 2023-12-27
  Administered 2023-12-26: 2 g via INTRAVENOUS
  Filled 2023-12-25: qty 100

## 2023-12-25 MED ORDER — POTASSIUM CHLORIDE 2 MEQ/ML IV SOLN
80.0000 meq | INTRAVENOUS | Status: DC
  Filled 2023-12-25: qty 40

## 2023-12-25 MED ORDER — MAGNESIUM SULFATE 50 % IJ SOLN
40.0000 meq | INTRAMUSCULAR | Status: DC
  Filled 2023-12-25: qty 9.85

## 2023-12-25 MED ORDER — NOREPINEPHRINE 4 MG/250ML-% IV SOLN
0.0000 ug/min | INTRAVENOUS | Status: AC
Start: 2023-12-26 — End: 2023-12-27
  Administered 2023-12-26: 2 ug/min via INTRAVENOUS
  Filled 2023-12-25: qty 250

## 2023-12-25 MED ORDER — DEXMEDETOMIDINE HCL IN NACL 400 MCG/100ML IV SOLN
0.1000 ug/kg/h | INTRAVENOUS | Status: AC
  Administered 2023-12-26: 62 ug via INTRAVENOUS
  Administered 2023-12-26: .7 ug/kg/h via INTRAVENOUS
  Filled 2023-12-25: qty 100

## 2023-12-25 NOTE — Anesthesia Preprocedure Evaluation (Signed)
 Anesthesia Evaluation  Patient identified by MRN, date of birth, ID band Patient awake    Reviewed: Allergy & Precautions, H&P , NPO status , Patient's Chart, lab work & pertinent test results  Airway Mallampati: II  TM Distance: >3 FB Neck ROM: Full    Dental no notable dental hx. (+) Teeth Intact, Dental Advisory Given   Pulmonary asthma , COPD,  COPD inhaler, former smoker   Pulmonary exam normal breath sounds clear to auscultation       Cardiovascular Exercise Tolerance: Good hypertension, Pt. on medications + dysrhythmias Atrial Fibrillation + Valvular Problems/Murmurs AS  Rhythm:Regular Rate:Normal + Systolic murmurs    Neuro/Psych   Anxiety     negative neurological ROS     GI/Hepatic Neg liver ROS,GERD  Medicated,,  Endo/Other  negative endocrine ROS    Renal/GU negative Renal ROS  negative genitourinary   Musculoskeletal   Abdominal   Peds  Hematology negative hematology ROS (+)   Anesthesia Other Findings   Reproductive/Obstetrics negative OB ROS                              Anesthesia Physical Anesthesia Plan  ASA: 4  Anesthesia Plan: MAC   Post-op Pain Management: Tylenol  PO (pre-op)* and Precedex   Induction: Intravenous  PONV Risk Score and Plan: 2 and Propofol infusion and Ondansetron   Airway Management Planned: Natural Airway and Simple Face Mask  Additional Equipment: Arterial line  Intra-op Plan:   Post-operative Plan:   Informed Consent: I have reviewed the patients History and Physical, chart, labs and discussed the procedure including the risks, benefits and alternatives for the proposed anesthesia with the patient or authorized representative who has indicated his/her understanding and acceptance.     Dental advisory given  Plan Discussed with: CRNA  Anesthesia Plan Comments:          Anesthesia Quick Evaluation

## 2023-12-26 ENCOUNTER — Inpatient Hospital Stay (HOSPITAL_COMMUNITY)

## 2023-12-26 ENCOUNTER — Encounter (HOSPITAL_COMMUNITY): Payer: Self-pay | Admitting: Physician Assistant

## 2023-12-26 ENCOUNTER — Inpatient Hospital Stay (HOSPITAL_COMMUNITY)
Admission: RE | Admit: 2023-12-26 | Discharge: 2023-12-29 | DRG: 267 | Disposition: A | Discharge status: Home Health | Attending: Internal Medicine | Admitting: Internal Medicine

## 2023-12-26 ENCOUNTER — Inpatient Hospital Stay (HOSPITAL_COMMUNITY): Payer: Self-pay | Admitting: Anesthesiology

## 2023-12-26 ENCOUNTER — Encounter (HOSPITAL_COMMUNITY): Payer: Self-pay | Admitting: Internal Medicine

## 2023-12-26 ENCOUNTER — Encounter (HOSPITAL_COMMUNITY)
Admission: RE | Disposition: A | Discharge status: Home Health | Payer: Self-pay | Source: Home / Self Care | Attending: Internal Medicine

## 2023-12-26 ENCOUNTER — Other Ambulatory Visit: Payer: Self-pay

## 2023-12-26 DIAGNOSIS — Z952 Presence of prosthetic heart valve: Principal | ICD-10-CM

## 2023-12-26 DIAGNOSIS — I2510 Atherosclerotic heart disease of native coronary artery without angina pectoris: Secondary | ICD-10-CM | POA: Diagnosis present

## 2023-12-26 DIAGNOSIS — I10 Essential (primary) hypertension: Secondary | ICD-10-CM | POA: Diagnosis present

## 2023-12-26 DIAGNOSIS — J4489 Other specified chronic obstructive pulmonary disease: Secondary | ICD-10-CM | POA: Diagnosis present

## 2023-12-26 DIAGNOSIS — Z87891 Personal history of nicotine dependence: Secondary | ICD-10-CM | POA: Diagnosis not present

## 2023-12-26 DIAGNOSIS — I517 Cardiomegaly: Secondary | ICD-10-CM | POA: Diagnosis not present

## 2023-12-26 DIAGNOSIS — D6869 Other thrombophilia: Secondary | ICD-10-CM | POA: Diagnosis present

## 2023-12-26 DIAGNOSIS — N182 Chronic kidney disease, stage 2 (mild): Secondary | ICD-10-CM | POA: Diagnosis present

## 2023-12-26 DIAGNOSIS — I350 Nonrheumatic aortic (valve) stenosis: Secondary | ICD-10-CM

## 2023-12-26 DIAGNOSIS — Z79890 Hormone replacement therapy (postmenopausal): Secondary | ICD-10-CM

## 2023-12-26 DIAGNOSIS — Z825 Family history of asthma and other chronic lower respiratory diseases: Secondary | ICD-10-CM

## 2023-12-26 DIAGNOSIS — D696 Thrombocytopenia, unspecified: Secondary | ICD-10-CM | POA: Diagnosis present

## 2023-12-26 DIAGNOSIS — I700 Atherosclerosis of aorta: Secondary | ICD-10-CM | POA: Diagnosis present

## 2023-12-26 DIAGNOSIS — K219 Gastro-esophageal reflux disease without esophagitis: Secondary | ICD-10-CM | POA: Diagnosis present

## 2023-12-26 DIAGNOSIS — I4821 Permanent atrial fibrillation: Secondary | ICD-10-CM | POA: Diagnosis present

## 2023-12-26 DIAGNOSIS — Z85828 Personal history of other malignant neoplasm of skin: Secondary | ICD-10-CM | POA: Diagnosis not present

## 2023-12-26 DIAGNOSIS — R609 Edema, unspecified: Secondary | ICD-10-CM | POA: Diagnosis present

## 2023-12-26 DIAGNOSIS — M2041 Other hammer toe(s) (acquired), right foot: Secondary | ICD-10-CM | POA: Diagnosis present

## 2023-12-26 DIAGNOSIS — Z880 Allergy status to penicillin: Secondary | ICD-10-CM | POA: Diagnosis not present

## 2023-12-26 DIAGNOSIS — I129 Hypertensive chronic kidney disease with stage 1 through stage 4 chronic kidney disease, or unspecified chronic kidney disease: Secondary | ICD-10-CM | POA: Diagnosis present

## 2023-12-26 DIAGNOSIS — Z006 Encounter for examination for normal comparison and control in clinical research program: Secondary | ICD-10-CM | POA: Diagnosis not present

## 2023-12-26 DIAGNOSIS — H2512 Age-related nuclear cataract, left eye: Secondary | ICD-10-CM | POA: Diagnosis present

## 2023-12-26 DIAGNOSIS — Z79899 Other long term (current) drug therapy: Secondary | ICD-10-CM

## 2023-12-26 DIAGNOSIS — J449 Chronic obstructive pulmonary disease, unspecified: Secondary | ICD-10-CM | POA: Diagnosis present

## 2023-12-26 DIAGNOSIS — M3130 Wegener's granulomatosis without renal involvement: Secondary | ICD-10-CM | POA: Diagnosis present

## 2023-12-26 DIAGNOSIS — E871 Hypo-osmolality and hyponatremia: Secondary | ICD-10-CM | POA: Diagnosis present

## 2023-12-26 DIAGNOSIS — Z8546 Personal history of malignant neoplasm of prostate: Secondary | ICD-10-CM

## 2023-12-26 DIAGNOSIS — Z7901 Long term (current) use of anticoagulants: Secondary | ICD-10-CM

## 2023-12-26 DIAGNOSIS — I442 Atrioventricular block, complete: Secondary | ICD-10-CM | POA: Diagnosis present

## 2023-12-26 DIAGNOSIS — E785 Hyperlipidemia, unspecified: Secondary | ICD-10-CM | POA: Diagnosis present

## 2023-12-26 DIAGNOSIS — J4550 Severe persistent asthma, uncomplicated: Secondary | ICD-10-CM | POA: Diagnosis present

## 2023-12-26 DIAGNOSIS — I452 Bifascicular block: Secondary | ICD-10-CM | POA: Diagnosis present

## 2023-12-26 DIAGNOSIS — I4510 Unspecified right bundle-branch block: Secondary | ICD-10-CM | POA: Diagnosis present

## 2023-12-26 DIAGNOSIS — Z7951 Long term (current) use of inhaled steroids: Secondary | ICD-10-CM

## 2023-12-26 DIAGNOSIS — F419 Anxiety disorder, unspecified: Secondary | ICD-10-CM | POA: Diagnosis present

## 2023-12-26 DIAGNOSIS — Z950 Presence of cardiac pacemaker: Secondary | ICD-10-CM | POA: Diagnosis not present

## 2023-12-26 DIAGNOSIS — M2042 Other hammer toe(s) (acquired), left foot: Secondary | ICD-10-CM | POA: Diagnosis present

## 2023-12-26 DIAGNOSIS — R918 Other nonspecific abnormal finding of lung field: Secondary | ICD-10-CM | POA: Diagnosis not present

## 2023-12-26 DIAGNOSIS — I495 Sick sinus syndrome: Secondary | ICD-10-CM | POA: Diagnosis present

## 2023-12-26 HISTORY — PX: INTRAOPERATIVE TRANSTHORACIC ECHOCARDIOGRAM: SHX6523

## 2023-12-26 LAB — POCT I-STAT, CHEM 8
BUN: 16 mg/dL (ref 8–23)
Calcium, Ion: 1.21 mmol/L (ref 1.15–1.40)
Chloride: 105 mmol/L (ref 98–111)
Creatinine, Ser: 1 mg/dL (ref 0.61–1.24)
Glucose, Bld: 101 mg/dL — ABNORMAL HIGH (ref 70–99)
HCT: 27 % — ABNORMAL LOW (ref 39.0–52.0)
Hemoglobin: 9.2 g/dL — ABNORMAL LOW (ref 13.0–17.0)
Potassium: 3.7 mmol/L (ref 3.5–5.1)
Sodium: 140 mmol/L (ref 135–145)
TCO2: 21 mmol/L — ABNORMAL LOW (ref 22–32)

## 2023-12-26 LAB — POCT ACTIVATED CLOTTING TIME
Activated Clotting Time: 216 s
Activated Clotting Time: 250 s

## 2023-12-26 LAB — ECHOCARDIOGRAM LIMITED
AR max vel: 0.87 cm2
AV Area VTI: 0.86 cm2
AV Area mean vel: 0.82 cm2
AV Mean grad: 22.5 mmHg
AV Peak grad: 27.8 mmHg
Ao pk vel: 2.64 m/s
Est EF: 50

## 2023-12-26 LAB — ABO/RH: ABO/RH(D): O POS

## 2023-12-26 MED ORDER — PROPOFOL 10 MG/ML IV BOLUS
INTRAVENOUS | Status: DC | PRN
  Administered 2023-12-26: 30 mg via INTRAVENOUS
  Administered 2023-12-26: 25 ug/kg/min via INTRAVENOUS
  Administered 2023-12-26: 20 mg via INTRAVENOUS

## 2023-12-26 MED ORDER — BRIMONIDINE TARTRATE 0.2 % OP SOLN
1.0000 [drp] | Freq: Two times a day (BID) | OPHTHALMIC | Status: DC
  Administered 2023-12-26 – 2023-12-29 (×6): 1 [drp] via OPHTHALMIC
  Filled 2023-12-26: qty 5

## 2023-12-26 MED ORDER — AMLODIPINE BESYLATE 5 MG PO TABS
5.0000 mg | ORAL_TABLET | Freq: Every day | ORAL | Status: DC
  Administered 2023-12-26: 5 mg via ORAL

## 2023-12-26 MED ORDER — SODIUM CHLORIDE 0.9 % IV SOLN: INTRAVENOUS | Status: AC

## 2023-12-26 MED ORDER — SODIUM CHLORIDE 0.9% FLUSH
3.0000 mL | Freq: Two times a day (BID) | INTRAVENOUS | Status: DC
  Administered 2023-12-26 – 2023-12-28 (×5): 3 mL via INTRAVENOUS

## 2023-12-26 MED ORDER — FERROUS SULFATE DRIED ER 45 MG PO TBCR: 45.0000 mg | EXTENDED_RELEASE_TABLET | Freq: Every day | ORAL | Status: DC

## 2023-12-26 MED ORDER — CHLORHEXIDINE GLUCONATE 4 % EX SOLN: 60.0000 mL | Freq: Once | CUTANEOUS | Status: DC

## 2023-12-26 MED ORDER — AZELASTINE HCL 0.1 % NA SOLN
2.0000 | Freq: Two times a day (BID) | NASAL | Status: DC
  Administered 2023-12-26 – 2023-12-28 (×3): 2 via NASAL
  Filled 2023-12-26: qty 30

## 2023-12-26 MED ORDER — FAMOTIDINE 20 MG PO TABS
20.0000 mg | ORAL_TABLET | Freq: Every day | ORAL | Status: DC
  Administered 2023-12-26 – 2023-12-28 (×3): 20 mg via ORAL
  Filled 2023-12-26 (×3): qty 1

## 2023-12-26 MED ORDER — ONDANSETRON HCL 4 MG/2ML IJ SOLN
INTRAMUSCULAR | Status: DC | PRN
  Administered 2023-12-26: 4 mg via INTRAVENOUS

## 2023-12-26 MED ORDER — SODIUM CHLORIDE 0.9 % IV SOLN: INTRAVENOUS | Status: DC

## 2023-12-26 MED ORDER — AZELASTINE HCL 137 MCG/SPRAY NA SOLN
2.0000 | Freq: Two times a day (BID) | NASAL | Status: DC
Start: 2023-12-26 — End: 2023-12-26

## 2023-12-26 MED ORDER — LORATADINE 10 MG PO TABS
10.0000 mg | ORAL_TABLET | Freq: Every evening | ORAL | Status: DC
  Administered 2023-12-26 – 2023-12-27 (×2): 10 mg via ORAL
  Filled 2023-12-26 (×2): qty 1

## 2023-12-26 MED ORDER — NOREPINEPHRINE 4 MG/250ML-% IV SOLN: 0.0000 ug/min | INTRAVENOUS | Status: DC

## 2023-12-26 MED ORDER — SODIUM CHLORIDE 0.9 % IV SOLN: INTRAVENOUS | Status: DC | PRN

## 2023-12-26 MED ORDER — PROTAMINE SULFATE 10 MG/ML IV SOLN
INTRAVENOUS | Status: DC | PRN
  Administered 2023-12-26: 50 mg via INTRAVENOUS
  Administered 2023-12-26: 30 mg via INTRAVENOUS

## 2023-12-26 MED ORDER — CHLORHEXIDINE GLUCONATE 0.12 % MT SOLN
15.0000 mL | Freq: Once | OROMUCOSAL | Status: AC
  Administered 2023-12-26: 15 mL via OROMUCOSAL
  Filled 2023-12-26: qty 15

## 2023-12-26 MED ORDER — PANTOPRAZOLE SODIUM 40 MG PO TBEC
40.0000 mg | DELAYED_RELEASE_TABLET | Freq: Every morning | ORAL | Status: DC
  Administered 2023-12-27 – 2023-12-29 (×3): 40 mg via ORAL
  Filled 2023-12-26 (×4): qty 1

## 2023-12-26 MED ORDER — FENTANYL CITRATE (PF) 100 MCG/2ML IJ SOLN
INTRAMUSCULAR | Status: AC
  Filled 2023-12-26: qty 2

## 2023-12-26 MED ORDER — LEVOTHYROXINE SODIUM 88 MCG PO TABS
88.0000 ug | ORAL_TABLET | Freq: Every day | ORAL | Status: DC
  Administered 2023-12-27 – 2023-12-29 (×3): 88 ug via ORAL
  Filled 2023-12-26 (×3): qty 1

## 2023-12-26 MED ORDER — CLEVIDIPINE BUTYRATE 0.5 MG/ML IV EMUL
INTRAVENOUS | Status: DC | PRN
  Administered 2023-12-26: 2 mg/h via INTRAVENOUS

## 2023-12-26 MED ORDER — ACETAMINOPHEN 500 MG PO TABS
1000.0000 mg | ORAL_TABLET | Freq: Once | ORAL | Status: AC
  Administered 2023-12-26: 1000 mg via ORAL
  Filled 2023-12-26: qty 2

## 2023-12-26 MED ORDER — PHENYLEPHRINE 80 MCG/ML (10ML) SYRINGE FOR IV PUSH (FOR BLOOD PRESSURE SUPPORT)
PREFILLED_SYRINGE | INTRAVENOUS | Status: DC | PRN
  Administered 2023-12-26: 40 ug via INTRAVENOUS

## 2023-12-26 MED ORDER — OXYCODONE HCL 5 MG PO TABS
5.0000 mg | ORAL_TABLET | ORAL | Status: DC | PRN
  Administered 2023-12-29: 5 mg via ORAL
  Filled 2023-12-26: qty 1

## 2023-12-26 MED ORDER — ACETAMINOPHEN 325 MG PO TABS
650.0000 mg | ORAL_TABLET | Freq: Four times a day (QID) | ORAL | Status: DC | PRN
  Administered 2023-12-26 – 2023-12-28 (×5): 650 mg via ORAL
  Filled 2023-12-26 (×5): qty 2

## 2023-12-26 MED ORDER — LIDOCAINE HCL (PF) 1 % IJ SOLN
INTRAMUSCULAR | Status: DC | PRN
  Administered 2023-12-26: 15 mL
  Administered 2023-12-26: 12 mL
  Administered 2023-12-26: 5 mL

## 2023-12-26 MED ORDER — ROSUVASTATIN CALCIUM 5 MG PO TABS
5.0000 mg | ORAL_TABLET | Freq: Every day | ORAL | Status: DC
  Administered 2023-12-26 – 2023-12-29 (×4): 5 mg via ORAL
  Filled 2023-12-26 (×4): qty 1

## 2023-12-26 MED ORDER — MORPHINE SULFATE (PF) 2 MG/ML IV SOLN
INTRAVENOUS | Status: AC
  Filled 2023-12-26: qty 1

## 2023-12-26 MED ORDER — HEPARIN SODIUM (PORCINE) 1000 UNIT/ML IJ SOLN
INTRAMUSCULAR | Status: DC | PRN
  Administered 2023-12-26: 5000 [IU] via INTRAVENOUS
  Administered 2023-12-26: 3000 [IU] via INTRAVENOUS

## 2023-12-26 MED ORDER — ACETAMINOPHEN 650 MG RE SUPP: 650.0000 mg | Freq: Four times a day (QID) | RECTAL | Status: DC | PRN

## 2023-12-26 MED ORDER — ALPRAZOLAM 0.25 MG PO TABS
0.1250 mg | ORAL_TABLET | Freq: Every evening | ORAL | Status: DC | PRN
  Administered 2023-12-26 – 2023-12-28 (×3): 0.125 mg via ORAL
  Filled 2023-12-26 (×3): qty 1

## 2023-12-26 MED ORDER — AMLODIPINE BESYLATE 5 MG PO TABS
ORAL_TABLET | ORAL | Status: AC
  Filled 2023-12-26: qty 1

## 2023-12-26 MED ORDER — LEVOCETIRIZINE DIHYDROCHLORIDE 5 MG PO TABS: 5.0000 mg | ORAL_TABLET | Freq: Every evening | ORAL | Status: DC

## 2023-12-26 MED ORDER — MORPHINE SULFATE (PF) 2 MG/ML IV SOLN
1.0000 mg | INTRAVENOUS | Status: DC | PRN
  Administered 2023-12-26 (×2): 2 mg via INTRAVENOUS
  Administered 2023-12-28: 4 mg via INTRAVENOUS
  Administered 2023-12-29: 2 mg via INTRAVENOUS
  Filled 2023-12-26: qty 2
  Filled 2023-12-26 (×2): qty 1

## 2023-12-26 MED ORDER — MONTELUKAST SODIUM 10 MG PO TABS
10.0000 mg | ORAL_TABLET | Freq: Every day | ORAL | Status: DC
  Administered 2023-12-26 – 2023-12-28 (×3): 10 mg via ORAL
  Filled 2023-12-26 (×3): qty 1

## 2023-12-26 MED ORDER — CEFAZOLIN SODIUM-DEXTROSE 2-4 GM/100ML-% IV SOLN
2.0000 g | Freq: Three times a day (TID) | INTRAVENOUS | Status: AC
  Administered 2023-12-26 – 2023-12-27 (×2): 2 g via INTRAVENOUS
  Filled 2023-12-26 (×2): qty 100

## 2023-12-26 MED ORDER — TRAMADOL HCL 50 MG PO TABS
50.0000 mg | ORAL_TABLET | ORAL | Status: DC | PRN
  Administered 2023-12-29: 50 mg via ORAL
  Filled 2023-12-26: qty 1

## 2023-12-26 MED ORDER — HEPARIN (PORCINE) IN NACL 2000-0.9 UNIT/L-% IV SOLN
INTRAVENOUS | Status: DC | PRN
  Administered 2023-12-26: 1000 mL

## 2023-12-26 MED ORDER — SODIUM CHLORIDE 0.9% FLUSH: 3.0000 mL | INTRAVENOUS | Status: DC | PRN

## 2023-12-26 MED ORDER — FENTANYL CITRATE (PF) 100 MCG/2ML IJ SOLN
INTRAMUSCULAR | Status: DC | PRN
  Administered 2023-12-26 (×2): 25 ug via INTRAVENOUS

## 2023-12-26 MED ORDER — NITROGLYCERIN IN D5W 200-5 MCG/ML-% IV SOLN
0.0000 ug/min | INTRAVENOUS | Status: DC
  Administered 2023-12-26: 5 ug/min via INTRAVENOUS
  Administered 2023-12-28 – 2023-12-29 (×2): 25 ug/min via INTRAVENOUS
  Filled 2023-12-26 (×3): qty 250

## 2023-12-26 MED ORDER — CHLORHEXIDINE GLUCONATE 4 % EX SOLN: 30.0000 mL | CUTANEOUS | Status: DC

## 2023-12-26 MED ORDER — IODIXANOL 320 MG/ML IV SOLN
INTRAVENOUS | Status: DC | PRN
  Administered 2023-12-26: 65 mL via INTRA_ARTERIAL

## 2023-12-26 MED ORDER — SODIUM CHLORIDE 0.9 % IV SOLN: 250.0000 mL | INTRAVENOUS | Status: DC | PRN

## 2023-12-26 MED ORDER — ONDANSETRON HCL 4 MG/2ML IJ SOLN: 4.0000 mg | Freq: Four times a day (QID) | INTRAMUSCULAR | Status: DC | PRN

## 2023-12-26 NOTE — Progress Notes (Signed)
 Brief Progress Note  Doing well this morning.  No new issues since last seen in clinic.  Vitals:   12/26/23 0728  BP: (!) 169/76  Pulse: 86  Resp: 18  Temp: 97.8 F (36.6 C)  SpO2: 99%   Plan: Proceed with TF TAVR with RIJ pacing today   Con Clunes, MD Cardiothoracic Surgery Pager: [phone]

## 2023-12-26 NOTE — Discharge Instructions (Signed)
 ACTIVITY AND EXERCISE  Daily activity and exercise are an important part of your recovery. People recover at different rates depending on their general health and type of valve procedure.  Most people recovering from TAVR feel better relatively quickly   No lifting, pushing, pulling more than 10 pounds (examples to avoid: groceries, vacuuming, gardening, golfing):             - For one week with a procedure through the groin.             - For six weeks for procedures through the chest wall or neck. NOTE: You will typically see one of our providers 7-14 days after your procedure to discuss WHEN TO RESUME the above activities.      DRIVING  Do not drive until you are seen for follow up and cleared by a provider. Generally, we ask patient to not drive for 1 week after their procedure.  If you have been told by your doctor in the past that you may not drive, you must talk with him/her before you begin driving again.   DRESSING  Groin site: you may leave the clear dressing over the site until follow up or remove 3-5 days post procedure.   HYGIENE  If you had a femoral (leg) procedure, you may take a shower when you return home. After the shower, pat the site dry. Do NOT use powder, oils or lotions in your groin area until the site has completely healed.   If you had a chest procedure, you may shower when you return home unless specifically instructed not to by your discharging practitioner.             - DO NOT scrub incision; pat dry with a towel.             - DO NOT apply any lotions, oils, powders to the incision.             - No tub baths / swimming for at least 2 weeks.  If you notice any fevers, chills, increased pain, swelling, bleeding or pus, please contact your provider.   ADDITIONAL INFORMATION  If you are going to have an upcoming dental procedure, please contact our office as you will require antibiotics ahead of time to prevent infection on your heart valve.    If you have any  questions or concerns you can call the structural heart phone during normal business hours 8am-4pm. If you have an urgent need after hours or weekends please call [phone] to talk to the on call provider for general cardiology. If you have an emergency that requires immediate attention, please call 911.    After TAVR Checklist  Check  Test Description   Follow up appointment in 1-2 weeks  You will see our structural heart advanced practice provider. Your incision sites will be checked and you will be cleared to drive and resume all normal activities if you are doing well.     1 month echo and follow up (23-75 days) You will have an echo to check on your new heart valve and be seen back in the office by a structural heart advanced practice provider.   Follow up with your primary cardiologist You will need to be seen by your primary cardiologist in the following 3-6 months after your 1 month appointment in the valve clinic.    1 year echo and follow up (60 days +/- the anniversary of your TAVR) You will have another echo  to check on your heart valve after 1 year and be seen back in the office by a structural heart advanced practice provider. This your last structural heart visit.   Bacterial endocarditis prophylaxis  You will have to take antibiotics for the rest of your life before all dental procedures (even teeth cleanings) to protect your heart valve. Antibiotics are also required before some surgeries. Please check with your cardiologist before scheduling any surgeries. Also, please make sure to tell us  if you have a penicillin allergy as you will require an alternative antibiotic.    After Your Pacemaker   You have a Environmental Education Officer  If you have a Medtronic or Biotronik device, plug in your home monitor once you get home, and no manual interaction is required.   If you have an Abbott or [employer] device, plug your home monitor once you get home, sit near the device, and  press the large activation button. Sit nearby until the process is complete, usually notated by lights on the monitor.   If you were set up for monitoring using an app on your phone, make sure the app remains open in the background and the Bluetooth remains on.  ACTIVITY Do not lift your arm above shoulder height for 1 week after your procedure. After 7 days, you may progress as below.  You should remove your sling 24 hours after your procedure, unless otherwise instructed by your provider.     Thursday January 04, 2024  Friday January 05, 2024 Saturday January 06, 2024 Sunday January 07, 2024   Do not lift, push, pull, or carry anything over 10 pounds with the affected arm until 6 weeks (Thursday February 08, 2024 ) after your procedure.   You may drive AFTER your wound check, unless you have been told otherwise by your provider.   Ask your healthcare provider when you can go back to work   INCISION/Dressing If you are on a blood thinner such as Coumadin, Xarelto, Eliquis , Plavix, or Pradaxa please confirm with your provider when this should be resumed.   If large square, outer bandage is left in place, this can be removed after 24 hours from your procedure. Do not remove steri-strips or glue as below.   If a PRESSURE DRESSING (a bulky dressing that usually goes up over your shoulder) was applied or left in place, please follow instructions given by your provider on when to return to have this removed.   Monitor your Pacemaker site for redness, swelling, and drainage. Call the device clinic at [phone] if you experience these symptoms or fever/chills.  If your incision is sealed with Steri-strips or staples, you may shower 7 days after your procedure or when told by your provider. Do not remove the steri-strips or let the shower hit directly on your site. You may wash around your site with soap and water .    If you were discharged in a sling, please do not wear this during the day  more than 48 hours after your surgery unless otherwise instructed. This may increase the risk of stiffness and soreness in your shoulder.   Avoid lotions, ointments, or perfumes over your incision until it is well-healed.  You may use a hot tub or a pool AFTER your wound check appointment if the incision is completely closed.  Pacemaker Alerts:  Some alerts are vibratory and others beep. These are NOT emergencies. Please call our office to let us  know. If this occurs at night or on weekends,  it can wait until the next business day. Send a remote transmission.  If your device is capable of reading fluid status (for heart failure), you will be offered monthly monitoring to review this with you.   DEVICE MANAGEMENT Remote monitoring is used to monitor your pacemaker from home. This monitoring is scheduled every 91 days by our office. It allows us  to keep an eye on the functioning of your device to ensure it is working properly. You will routinely see your Electrophysiologist annually (more often if necessary).  This will appear as a REMOTE check on your MyChart schedule. These are automatic and there is nothing for you to manually do unless otherwise instructed.  You should receive your ID card for your new device in 4-8 weeks. Keep this card with you at all times once received. Consider wearing a medical alert bracelet or necklace.  Your Pacemaker may be MRI compatible. This will be discussed at your next office visit/wound check.  You should avoid contact with strong electric or magnetic fields.   Do not use amateur (ham) radio equipment or electric (arc) welding torches. MP3 player headphones with magnets should not be used. Some devices are safe to use if held at least 12 inches (30 cm) from your Pacemaker. These include power tools, lawn mowers, and speakers. If you are unsure if something is safe to use, ask your health care provider.  When using your cell phone, hold it to the ear that is on  the opposite side from the Pacemaker. Do not leave your cell phone in a pocket over the Pacemaker.  You may safely use electric blankets, heating pads, computers, and microwave ovens.  Call the office right away if: You have chest pain. You feel more short of breath than you have felt before. You feel more light-headed than you have felt before. Your incision starts to open up.  This information is not intended to replace advice given to you by your health care provider. Make sure you discuss any questions you have with your health care provider.

## 2023-12-26 NOTE — Anesthesia Postprocedure Evaluation (Signed)
 Anesthesia Post Note  Patient: Chad Morrison  Procedure(s) Performed: Transcatheter Aortic Valve Replacement, Transfemoral (Right) ECHOCARDIOGRAM, TRANSTHORACIC     Patient location during evaluation: Cath Lab Anesthesia Type: MAC Level of consciousness: awake Pain management: pain level controlled Vital Signs Assessment: post-procedure vital signs reviewed and stable Respiratory status: spontaneous breathing, nonlabored ventilation, respiratory function stable and patient connected to nasal cannula oxygen Cardiovascular status: stable and blood pressure returned to baseline Postop Assessment: no apparent nausea or vomiting Anesthetic complications: no   There were no known notable events for this encounter.  Last Vitals:  Vitals:   12/26/23 1240 12/26/23 1245  BP: 137/83 131/85  Pulse: (!) 55 (!) 57  Resp: (!) 22 11  Temp:    SpO2: 93% 93%    Last Pain:  Vitals:   12/26/23 1145  TempSrc: Tympanic  PainSc: 0-No pain                 Koryn Charlot,W. EDMOND

## 2023-12-26 NOTE — Discharge Summary (Incomplete)
 [facility]   MULTIDISCIPLINARY HEART VALVE TEAM  Discharge Summary    Patient ID: Chad Morrison MRN: [id number]; DOB: 1932/05/22  Admit date: 12/26/2023 Discharge date: 12/29/2023  PCP:  Vernadine Charlie ORN, MD  [facility] Cardiologist:  Lurena MARLA Red, MD  [facility] Structural heart: Lurena MARLA Red, MD [facility] Electrophysiologist:  Fonda Kitty, MD   Discharge Diagnoses    Principal Problem:   S/P TAVR (transcatheter aortic valve replacement) Active Problems:   COPD (chronic obstructive pulmonary disease) (HCC)   Permanent atrial fibrillation (HCC)   Limited granulomatosis with polyangiitis (HCC)   Severe persistent asthma (HCC)   Severe aortic stenosis   Essential hypertension   Hyperlipidemia   RBBB   CHB (complete heart block) (HCC)   Allergies Allergies  Allergen Reactions   Penicillins Hives    Has patient had a PCN reaction causing immediate rash, facial/tongue/throat swelling, SOB or lightheadedness with hypotension: No Has patient had a PCN reaction causing severe rash involving mucus membranes or skin necrosis: No Has patient had a PCN reaction that required hospitalization: No Has patient had a PCN reaction occurring within the last 10 years: Yes If all of the above answers are NO, then may proceed with Cephalosporin use.    Latex Hives and Rash   Sulfa Antibiotics Nausea Only        Sulfamethoxazole-Trimethoprim Nausea Only    Diagnostic Studies/Procedures    HEART AND VASCULAR CENTER  TAVR OPERATIVE NOTE     Date of Procedure:                12/26/2023   Preoperative Diagnosis:Severe Aortic Stenosis, sinus arrest/complete heart block   Postoperative Diagnosis:    Same    Procedure:        Transcatheter Aortic Valve Replacement - Transfemoral Approach             Edwards Sapien 3 Resilia THV (size 26 mm, model # 9755RLS, serial # [id number])              Co-Surgeons:                        Con Clunes, MD and  Lurena Red, MD Anesthesiologist:                  Epifanio   Echocardiographer:              Santo   Pre-operative Echo Findings: Severe aortic stenosis Normal left ventricular systolic function   Post-operative Echo Findings: Trivial paravalvular leak Normal left ventricular systolic function   Left Heart Catheterization Findings: Left ventricular end-diastolic pressure of _____________    Echo 12/27/23:  IMPRESSIONS   1. Left ventricular ejection fraction, by estimation, is 50 to 55%. Left  ventricular ejection fraction by 2D MOD biplane is 50.3 %. The left  ventricle has low normal function. The left ventricle has no regional wall  motion abnormalities. There is  moderate left ventricular hypertrophy. Indeterminate diastolic filling due  to E-A fusion.   2. Right ventricular systolic function is normal. The right ventricular  size is normal. Tricuspid regurgitation signal is inadequate for assessing  PA pressure.   3. Left atrial size was severely dilated.   4. Right atrial size was severely dilated.   5. The mitral valve is degenerative. Mild mitral valve regurgitation.   6. The aortic valve has been repaired/replaced. Aortic valve  regurgitation is trivial. There is a 26 mm  Sapien prosthetic (TAVR) valve  present in the aortic position. Procedure Date: 12/26/2023. Echo findings  are consistent with perivalvular leak of the   aortic prosthesis. Aortic valve area, by VTI measures 2.22 cm. Aortic  valve mean gradient measures 6.4 mmHg. Aortic valve Vmax measures 1.68  m/s.   7. Aortic dilatation noted. There is borderline dilatation of the aortic  root, measuring 38 mm.   Comparison(s): Changes from prior study are noted. 12/26/2023: LVEF 50%,  TAVR with trivial perivalvular leak, MG 3 mmHg.    ______________________  S/p PPM 12/28/23 Technical Details SURGEON: Fonda Kitty, MD      PREPROCEDURE DIAGNOSES:   1. Tachycardia-bradycardia  syndrome  2. Complete heart block     POSTPROCEDURE DIAGNOSES:   1. Tachycardia-bradycardia syndrome  2. Complete heart block     PROCEDURES:    1. Dual chamber permanent pacemaker implant.     History of Present Illness     Chad Morrison is a 89 y.o. male with a history of HTN, HLD, permanent atrial fibrillation on Eliquis , Wegener's granulomatosis (on immunotherapy and low dose prednisone ), CKD stage II, limited mobility d/t bilateral hammertoes, RBBB, aortic atherosclerosis and critical AS who presented to [facility] on 12/26/23 for planned TAVR.    He has been followed by Dr. Wendel for aortic stenosis but remained asymptomatic and quite active. He was admitted in 05/2022 with LE edema and profound hyponatremic thought to be due to inadequately treated hypothyroidism. He was administered IV levothyroxine  with improvement in his edema and sodium levels. He continued to do well but recently developed progressive exertional dyspnea and fatigue. Echo 10/23/23 showed EF 50%, and critical AS with mean grad 52 mmHg, AVA 0.45 cm2, DVI 0.16, SVI 30 and mild AI, TR & MR. [facility] 11/06/23 showed mild, nonobstructive coronary artery disease. Fick cardiac output of 8.2 L/min and Fick cardiac index of 4.6 L/min/m with following hemodynamics: Right atrial pressure mean of 10 mmHg Right ventricular pressure 45/5 with an end-diastolic pressure of 9 mmHg. Wedge pressure mean of 23 mmHg the V waves to 31 mmHg PA pressure 43/19 with a mean of 29 mmHg PVR 0.7 Woods units PA pulsatility index of 2.4   The patient was evaluated by the multidisciplinary valve team and felt to have severe, symptomatic aortic stenosis and to be a suitable candidate for TAVR, which was set up for 12/26/23.   Hospital Course     Consultants: none   Severe AS:  -- S/p successful TAVR with a 26 mm Edwards Sapien 3 Ultra Resilia THV via the TF approach on 12/26/23.  -- Post operative echo showed EF 50-55%, mod LVH, severe biatrial enlargement,  mild MR and normally functioning TAVR with a mean gradient of 6.4 mmHg and trivial PVL.  -- Groin sites are stable.  -- Resume Eliquis  5mg  BID 11/1 PM. -- Met with cardiac rehab to discuss CRP phase II.  -- Plan for discharge home today with close follow up in the outpatient setting.   Transient CHB with tachybrady syndrome: -- Pt with underlying RBBB and developed CHB after TAVR.  -- RIJ pacer left in place and transferred to unit.  -- Developed elevated HRs in afib and put back on home Cardizem  CD 180 with evidence of transient pacing needs.  -- S/p [employer] PPM on 12/28/23 by Dr. Kitty.    Permanent atrial fibrillation:  -- HRs well controlled on home Cardizem  CD 180 mg daily. -- Resume Eliquis  5mg  BID 11/1 PM.   Hypertension:  --  BP elevated requiring IV nitroglycerin.  -- Continue Cardizem  CD 180 mg daily. -- Started on Losartan 25mg  daily to BID.  -- Follow in outpatient setting and add medications as indicated.    Hyperlipidemia:  -- Continue Crestor  5 mg daily.   -- Lipids followed by PCP.  CXR with LLL atelectasis vs infiltrate -- No signs of infection.  -- Start incentive spirometer. -- Close follow up on Monday to watch for s/s infection.   Hyponatremia: -- NA 125. -- Will recheck BMET Monday   Thrombocytopenia: -- Plat 166--> 85. -- Likely reactive. -- Will recheck CBC Monday.   _____________  Discharge Vitals Blood pressure (!) 141/86, pulse 75, temperature (!) 97.3 F (36.3 C), temperature source Oral, resp. rate 14, height 5' 9 (1.753 m), weight 60.9 kg, SpO2 94%.  Filed Weights   12/27/23 0500 12/28/23 0500 12/29/23 0409  Weight: 62 kg 58.8 kg 60.9 kg     GEN: Well nourished, well developed in no acute distress NECK: No JVD CARDIAC: irreg irreg, no murmurs, rubs, gallops. Pacemaker site stable.  RESPIRATORY:  Clear to auscultation without rales, wheezing or rhonchi  ABDOMEN: Soft, non-tender, non-distended EXTREMITIES:  No edema; No  deformity.  Groin sites clear without hematoma or ecchymosis.    Disposition   Pt is being discharged home today in good condition.  Follow-up Plans & Appointments     Follow-up Information     Sebastian Lamarr SAUNDERS, PA-C. Go on 01/01/2024.   Specialties: Cardiology, Radiology Why: @ 11:10am, please arrive at least 20 minutes early Contact information: [address] [place] [address] [phone]                Discharge Instructions     Amb Referral to Cardiac Rehabilitation   Complete by: As directed    Diagnosis: Valve Replacement   Valve: Aortic Comment - tavr   After initial evaluation and assessments completed: Virtual Based Care may be provided alone or in conjunction with Phase 2 Cardiac Rehab based on patient barriers.: Yes   Intensive Cardiac Rehabilitation (ICR) [facility] location only OR Traditional Cardiac Rehabilitation (TCR) *If criteria for ICR are not met will enroll in TCR [facility] only): Yes       Discharge Medications   Allergies as of 12/29/2023       Reactions   Penicillins Hives   Has patient had a PCN reaction causing immediate rash, facial/tongue/throat swelling, SOB or lightheadedness with hypotension: No Has patient had a PCN reaction causing severe rash involving mucus membranes or skin necrosis: No Has patient had a PCN reaction that required hospitalization: No Has patient had a PCN reaction occurring within the last 10 years: Yes If all of the above answers are NO, then may proceed with Cephalosporin use.   Latex Hives, Rash   Sulfa Antibiotics Nausea Only      Sulfamethoxazole-trimethoprim Nausea Only        Medication List     PAUSE taking these medications    apixaban  5 MG Tabs tablet Wait to take this until: December 30, 2023 Evening Commonly known as: Eliquis  Take 1 tablet (5 mg total) by mouth 2 (two) times daily.       TAKE these medications    acetaminophen  500 MG tablet Commonly known as: TYLENOL  Take  500 mg by mouth every 6 (six) hours as needed (pain/restless leg).   albuterol  108 (90 Base) MCG/ACT inhaler Commonly known as: VENTOLIN  HFA Inhale 2 puffs into the lungs every 4 (four) hours as  needed for wheezing or shortness of breath.   ALPRAZolam  0.25 MG tablet Commonly known as: XANAX  Take 0.125 mg by mouth at bedtime as needed for sleep.   Azelastine  HCl 137 MCG/SPRAY Soln Place 2 sprays into both nostrils 2 (two) times daily.   brimonidine  0.2 % ophthalmic solution Commonly known as: ALPHAGAN  Place 1 drop into the right eye 2 (two) times daily.   Calcium -Vitamin D 600-125 MG-UNIT Tabs Take 1 tablet by mouth 2 (two) times daily.   diltiazem  180 MG 24 hr capsule Commonly known as: CARDIZEM  CD TAKE 1 CAPSULE EVERY       MORNING AND AT BEDTIME   diphenhydrAMINE  25 MG tablet Commonly known as: BENADRYL  Take 25 mg by mouth daily as needed for allergies.   EPINEPHrine  0.3 mg/0.3 mL Soaj injection Commonly known as: EPI-PEN SMARTSIG:0.3 Milligram(s) IM Once What changed:  how much to take how to take this when to take this reasons to take this   famotidine 20 MG tablet Commonly known as: PEPCID Take 20 mg by mouth at bedtime.   FASENRA  [place] Inject into the skin every 8 (eight) weeks.   levocetirizine 5 MG tablet Commonly known as: XYZAL  TAKE 1 TABLET EVERY EVENING   levothyroxine  88 MCG tablet Commonly known as: SYNTHROID  Take 88 mcg by mouth daily before breakfast.   losartan 25 MG tablet Commonly known as: COZAAR Take 1 tablet (25 mg total) by mouth 2 (two) times daily.   montelukast  10 MG tablet Commonly known as: SINGULAIR  Take 1 tablet (10 mg total) by mouth at bedtime.   multivitamin with minerals Tabs tablet Take 1 tablet daily by mouth.   OCUVITE PRESERVISION PO Take 1 tablet 2 (two) times daily by mouth.   pantoprazole  40 MG tablet Commonly known as: PROTONIX  Take 40 mg by mouth every morning.   rosuvastatin  5 MG tablet Commonly known as:  CRESTOR  Take 1 tablet (5 mg total) by mouth daily.   Slow Release Iron 45 MG Tbcr Generic drug: Ferrous Sulfate  Dried Take 45 mg by mouth daily.   Symbicort  160-4.5 MCG/ACT inhaler Generic drug: budesonide -formoterol  Inhale 2 puffs into the lungs 2 (two) times daily.   SYSTANE OP Place 1 drop into both eyes 3 (three) times daily as needed (dry/irritated eyes.).   VITAMIN C PO Take 1 tablet by mouth every morning.            Outstanding Labs/Studies   BMET, CBC  ______________________  Duration of Discharge Encounter: APP Time: 25 minutes    Signed, Lamarr Hummer, PA-C 12/29/2023, 10:24 AM [phone]   ATTENDING ATTESTATION:  After conducting a review of all available clinical information with the care team, interviewing the patient, and performing a physical exam, I agree with the findings and plan described in this note.   GEN: No acute distress, AO x 3 HEENT:  MMM, no JVD, no scleral icterus Cardiac: RRR, no murmurs, rubs, or gallops.  Respiratory: Clear to auscultation bilaterally. GI: Soft, nontender, non-distended  MS: No edema; No deformity. Neuro:  Nonfocal  Vasc:  +2 radial pulses; right IJ sheath in place  Patient doing well after uncomplicated temporary pacemaker placement after TAVR for critical aortic stenosis.  Patient's chest x-ray shows likely atelectasis of the left lower lung.  Will prescribe incentive spirometer.  Restart Eliquis  today.  Discharge today with close hospital follow-up.  Will plan on optimizing medical therapy for blood pressure and heart rate management as outpatient.  APP discharge time:  25 MD discharge time: 52  Lurena Red, MD Pager [phone]

## 2023-12-26 NOTE — Progress Notes (Incomplete)
 HEART AND VASCULAR CENTER   MULTIDISCIPLINARY HEART VALVE TEAM  Patient Name: Chad Morrison Date of Encounter: 12/27/2023  Admit date: 12/26/2023   PCP:  Morrison, Chad W, MD  [name]:  Chad MARLA Red, MD  [name]: Chad MARLA Red, MD [facility] Electrophysiologist:  None   Hospital Problem List     Principal Problem:   S/P TAVR (transcatheter aortic valve replacement) Active Problems:   COPD (chronic obstructive pulmonary disease) (HCC)   Permanent atrial fibrillation (HCC)   Limited granulomatosis with polyangiitis (HCC)   Severe persistent asthma (HCC)   Severe aortic stenosis   Essential hypertension   Hyperlipidemia   RBBB   CHB (complete heart block) (HCC)     Subjective   No complaints except for all the wiring he's hooked up to. Told he was going to need a pacemaker.   Inpatient Medications    Scheduled Meds:  azelastine   2 spray Each Nare BID   brimonidine   1 drop Right Eye BID   diltiazem   180 mg Oral Daily   famotidine  20 mg Oral QHS   levothyroxine   88 mcg Oral QAC breakfast   loratadine   10 mg Oral QPM   losartan  25 mg Oral Daily   montelukast   10 mg Oral QHS   pantoprazole   40 mg Oral q morning   rosuvastatin   5 mg Oral Daily   sodium chloride  flush  3 mL Intravenous Q12H   Continuous Infusions:  sodium chloride      magnesium  sulfate bolus IVPB     nitroGLYCERIN 35 mcg/min (12/27/23 0616)   norepinephrine (LEVOPHED) Adult infusion     PRN Meds: sodium chloride , acetaminophen  **OR** acetaminophen , ALPRAZolam , morphine injection, ondansetron  (ZOFRAN ) IV, oxyCODONE, sodium chloride  flush, traMADol   Vital Signs    Vitals:   12/27/23 0610 12/27/23 0616 12/27/23 0700 12/27/23 0753  BP: (!) 160/90 (!) 166/83 (!) 174/96   Pulse: 93 92 87   Resp: (!) 21 11 12    Temp:    97.9 F (36.6 C)  TempSrc:    Oral  SpO2: 96% 96% 95%   Weight:      Height:        Intake/Output Summary (Last 24  hours) at 12/27/2023 0844 Last data filed at 12/27/2023 0616 Gross per 24 hour  Intake 2101.37 ml  Output 3000 ml  Net -898.63 ml   Filed Weights   12/26/23 0728 12/27/23 0500  Weight: 63.5 kg 62 kg    Physical Exam    GEN: Well nourished, well developed, in no acute distress.  HEENT: Grossly normal.  Neck: Supple, no JVD or masses. RIJ temp wire in place. Cardiac: irreg irreg, tachy, no murmurs, rubs, or gallops. No clubbing, cyanosis, edema.   Respiratory:  Respirations regular and unlabored, clear to auscultation bilaterally. GI: Soft, nontender, nondistended, BS + x 4. MS: no deformity or atrophy. Skin: warm and dry, no rash. Neuro:  Strength and sensation are intact. Psych: AAOx3.  Normal affect.  Labs    CBC Recent Labs    12/26/23 1114 12/27/23 0206  WBC  --  6.0  HGB 9.2* 11.0*  HCT 27.0* 33.9*  MCV  --  87.4  PLT  --  124*   Basic Metabolic Panel: Recent Labs  Lab 12/22/23 1030 12/26/23 1114 12/27/23 0206  NA 133* 140 135  K 3.6 3.7 4.4  CL 101 105 102  CO2 24  --  24  GLUCOSE 104* 101* 88  BUN 20 16 16   CREATININE 1.15 1.00 1.04  CALCIUM  9.0  --  9.0  MG  --   --  1.9   GFR: Estimated Creatinine Clearance: 42.2 mL/min (by C-G formula based on SCr of 1.04 mg/dL). Recent Labs  Lab 12/22/23 1030 12/27/23 0206  WBC 4.5 6.0    Telemetry   Afib with RBBB, HRs ranging from 80-110s - Personally Reviewed  ECG    Not completed this AM.  ECG from yesterday showed Atrial fibrillation, LAD, and RBBB. - Personally Reviewed  * sent a message to RN to get updated ECG today.  Patient Profile     Chad Morrison is a 89 y.o. male with a history of HTN, HLD, permanent atrial fibrillation on Eliquis , Wegener's granulomatosis (on immunotherapy and low dose prednisone ), CKD stage II, limited mobility d/t bilateral hammertoes, RBBB, aortic atherosclerosis and critical AS who presented to [facility] on 12/26/23 for planned TAVR.    Assessment & Plan    Severe AS:   -- S/p successful TAVR with a 26 mm Edwards Sapien 3 Ultra Resilia THV via the TF approach on 12/26/23.  -- Post operative echo completed but pending formal read. -- Groin sites are stable.  -- Home Eliquis  5mg  BID on hold until we know if PPM indicated.    CHB: -- Pt with underlying RBBB and developed CHB after TAVR.  -- RIJ pacer left in place and transferred to unit.  -- Now in normal afib rhythm with elevated rates, HRs up to 110 while talking to him.  -- Home Cardizem  180 mg daily held but will resume now given elevated rates and with temp pacer in place-- worry about tachy-brady syndrome. -- EP to see today and decide on course of action.    Permanent atrial fibrillation:  -- HRs creeping up as above.  -- Resume home Cardizem  180 mg daily. -- Holding Eliquis  5 mg BID for now.    Hypertension:  -- BP elevated and started on Norvasc 5mg  daily while holding home Cardizem . Despite this, he has required IV nitroglycerin for markedly elevated BPs.  -- Given elevated afib rates, resume home Cardizem  180 mg daily, stop Norvasc 5mg  daily and add Losartan 25mg  daily. Will escalate as needed to get him off IV nitro   Hyperlipidemia:  -- Continue Crestor  5 mg daily.   -- Lipids followed by PCP.  Chad Lamarr Hummer, PA-C  12/27/2023, 8:44 AM  Pager [phone]   ATTENDING ATTESTATION:  After conducting a review of all available clinical information with the care team, interviewing the patient, and performing a physical exam, I agree with the findings and plan described in this note.   GEN: No acute distress, AO x 3 HEENT:  MMM, right IJ TVP in place Cardiac: Irregular rate and rhythm; tachycardic, no murmurs, rubs, or gallops.  Respiratory: Clear to auscultation bilaterally. GI: Soft, nontender, non-distended  MS: No edema; No deformity. Neuro:  Nonfocal  Vasc:  +2 radial pulses; groin access sites stable and intact  Patient doing well after transcatheter valve replacement  with 26 mm SAPIEN 3 valve from the right transfemoral approach.  The procedure is associated with sinus arrest with no underlying ventricular escape.  A temporary pacemaker had been placed prior to valve implantation.  Over the ensuing few hours the patient regained conduction.  He is now back in atrial fibrillation with a right bundle branch block.  His rates are rather elevated.  His blood pressure is also elevated.  Will start losartan  25 mg.  Resume Cardizem  180 mg.  Will await EP recommendations regarding conduction issues/tachybradycardia syndrome.  Chad Red, MD Pager [phone]

## 2023-12-26 NOTE — Interval H&P Note (Signed)
 History and Physical Interval Note:  12/26/2023 9:28 AM  Chad Morrison  has presented today for surgery, with the diagnosis of Severe Aortic Stenosis.  The various methods of treatment have been discussed with the patient and family. After consideration of risks, benefits and other options for treatment, the patient has consented to  Procedure(s): Transcatheter Aortic Valve Replacement, Transfemoral (Right) ECHOCARDIOGRAM, TRANSTHORACIC (N/A) as a surgical intervention.  The patient's history has been reviewed, patient examined, no change in status, stable for surgery.  I have reviewed the patient's chart and labs.  Questions were answered to the patient's satisfaction.     Pauletta Pickney K Farron Watrous

## 2023-12-26 NOTE — Progress Notes (Signed)
  HEART AND VASCULAR CENTER   MULTIDISCIPLINARY HEART VALVE TEAM  Patient doing well s/p TAVR. He is hemodynamically stable but BP starting to creep up. Groin sites stable.  Pt with underlying RBBB and developed CHB after TAVR. He was initially pacer dependant but now back in intrinsic rhythm with afib, RBBB and LAD. HR 64. Home Cardizem  will remain on hold. Will add amlodipine to control BP while we watch his heart rhythm.  Keep RIJ pacer in and transfer to 2H when bed is available.    Lamarr Hummer PA-C  [facility]  Pager [phone]

## 2023-12-26 NOTE — Plan of Care (Signed)

## 2023-12-26 NOTE — Op Note (Signed)
 HEART AND VASCULAR CENTER  TAVR OPERATIVE NOTE   Date of Procedure:  12/26/2023  Preoperative Diagnosis: Severe Aortic Stenosis, sinus arrest/complete heart block  Postoperative Diagnosis: Same   Procedure:   Transcatheter Aortic Valve Replacement - Transfemoral Approach  Edwards Sapien 3 Resilia THV (size 26 mm, model # 9755RLS, serial # [id number])   Co-Surgeons:  Con Clunes, MD and Lurena Red, MD Anesthesiologist:  Epifanio  Echocardiographer:  Santo  Pre-operative Echo Findings: Severe aortic stenosis Normal left ventricular systolic function  Post-operative Echo Findings: Trivial paravalvular leak Normal left ventricular systolic function  Left Heart Catheterization Findings: Left ventricular end-diastolic pressure of   BRIEF CLINICAL NOTE AND INDICATIONS FOR SURGERY  The patient is a 89-year-old male with a history of hypertension, hyperlipidemia, permanent atrial fibrillation on Eliquis , Wegener's granulomatosis, CKD stage III, right bundle branch block, aortic atherosclerosis, severe symptomatic aortic stenosis who is referred for elective transcatheter aortic valve replacement with a 26 mm SAPIEN 3 valve from the right transfemoral approach with planned right IJ pacing due to increased risk for heart block  During the course of the patient's preoperative work up they have been evaluated comprehensively by a multidisciplinary team of specialists coordinated through the Multidisciplinary Heart Valve Clinic in the [facility].  They have been demonstrated to suffer from symptomatic severe aortic stenosis as noted above. The patient has been counseled extensively as to the relative risks and benefits of all options for the treatment of severe aortic stenosis including long term medical therapy, conventional surgery for aortic valve replacement, and transcatheter aortic valve replacement.  The patient has been independently evaluated  by Dr. Clunes with CT surgery and they are felt to be at high risk for conventional surgical aortic valve replacement. The surgeon indicated the patient would be a poor candidate for conventional surgery. Based upon review of all of the patient's preoperative diagnostic tests they are felt to be candidate for transcatheter aortic valve replacement using the transfemoral approach as an alternative to high risk conventional surgery.    Following the decision to proceed with transcatheter aortic valve replacement, a discussion has been held regarding what types of management strategies would be attempted intraoperatively in the event of life-threatening complications, including whether or not the patient would be considered a candidate for the use of cardiopulmonary bypass and/or conversion to open sternotomy for attempted surgical intervention.  The patient has been advised of a variety of complications that might develop peculiar to this approach including but not limited to risks of death, stroke, paravalvular leak, aortic dissection or other major vascular complications, aortic annulus rupture, device embolization, cardiac rupture or perforation, acute myocardial infarction, arrhythmia, heart block or bradycardia requiring permanent pacemaker placement, congestive heart failure, respiratory failure, renal failure, pneumonia, infection, other late complications related to structural valve deterioration or migration, or other complications that might ultimately cause a temporary or permanent loss of functional independence or other long term morbidity.  The patient provides full informed consent for the procedure as described and all questions were answered preoperatively.    DETAILS OF THE OPERATIVE PROCEDURE  PREPARATION:   The patient is brought to the operating room on the above mentioned date and central monitoring was established by the anesthesia team. The patient is placed in the supine position on the  operating table.  Intravenous antibiotics are administered. Conscious sedation is used.   Baseline transthoracic echocardiogram was performed. The patient's chest, abdomen, both groins, and both lower extremities are prepared and  draped in a sterile manner. A time out procedure is performed.   PERIPHERAL ACCESS:   Using the modified Seldinger technique, femoral arterial and venous access were obtained with placement of a 6 Fr sheath in the left common femoral artery and a 6 Fr sheath in the right internal jugular vein using u/s guidance.  A pigtail diagnostic catheter was passed through the femoral arterial sheath under fluoroscopic guidance into the aortic root.  A temporary transvenous pacemaker catheter was passed through the femoral venous sheath under fluoroscopic guidance into the right ventricle.  The pacemaker was tested to ensure stable lead placement and pacemaker capture. Aortic root angiography was performed in order to determine the optimal angiographic angle for valve deployment.  TRANSFEMORAL ACCESS:  A micropuncture kit was used to gain access to the right common femoral artery using u/s guidance. Position confirmed with angiography. Pre-closure with double ProGlide closure devices. The patient was heparinized systemically and ACT verified > 250 seconds.    A 14 Fr transfemoral E-sheath was introduced into the right common femoral artery after progressively dilating over an Amplatz superstiff wire. An AL-1 catheter was used to direct a straight-tip exchange length wire across the native aortic valve into the left ventricle. This was exchanged out for a pigtail catheter and position was confirmed in the LV apex. Simultaneous left ventricular, aortic, and left ventricular end-diastolic pressures were recorded.  The pigtail catheter was then exchanged for an Safari wire in the LV apex.    TRANSCATHETER HEART VALVE DEPLOYMENT:  An Edwards Sapien 3 THV (size 26 mm) was prepared and crimped  per manufacturer's guidelines, and the proper orientation of the valve is confirmed on the [employer] delivery system. The valve was advanced through the introducer sheath using normal technique until in an appropriate position in the abdominal aorta beyond the sheath tip. The balloon was then retracted and using the fine-tuning wheel was centered on the valve. The valve was then advanced across the aortic arch using appropriate flexion of the catheter. The valve was carefully positioned across the aortic valve annulus. The Commander catheter was retracted using normal technique. Once final position of the valve has been confirmed by angiographic assessment, the valve is deployed while temporarily holding ventilation and during rapid ventricular pacing to maintain systolic blood pressure < 50 mmHg and pulse pressure < 10 mmHg. The balloon inflation is held for >3 seconds after reaching full deployment volume. Once the balloon has fully deflated the balloon is retracted into the ascending aorta and valve function is assessed using TTE. There is felt to be no paravalvular leak and no central aortic insufficiency.  The patient's hemodynamic recovery following valve deployment is good.  The deployment balloon and guidewire are both removed. Echo demostrated acceptable post-procedural gradients, stable mitral valve function, and no AI.  The patient developed complete heart block and sinus arrest and a permanent pacemaker was set to a backup rate of 60 bpm.  PROCEDURE COMPLETION:  The sheath was then removed and closure devices were completed. Protamine was administered once femoral arterial repair was complete. The temporary pacemaker, pigtail catheters and femoral sheaths were removed with  a Mynx closure device placed in the artery and manual pressure used for venous hemostasis.    The patient tolerated the procedure well and is transported to the surgical intensive care in stable condition. There were no  immediate intraoperative complications. All sponge instrument and needle counts are verified correct at completion of the operation.   No blood products  were administered during the operation.  The patient received a total of 65 mL of intravenous contrast during the procedure.  Sahith Nurse K Lindaann Gradilla MD 12/26/2023 1:53 PM

## 2023-12-26 NOTE — Op Note (Addendum)
 HEART AND VASCULAR CENTER   MULTIDISCIPLINARY HEART VALVE TEAM   TAVR OPERATIVE NOTE   Date of Procedure:  12/26/2023  Preoperative Diagnosis: Severe Aortic Stenosis   Postoperative Diagnosis: Same   Procedure:   Transcatheter Aortic Valve Replacement - Percutaneous Transfemoral Approach  Edwards Sapien 3 Ultra Resilia (size 26 mm, model # 9755RSL, serial # [id number] )   Co-Surgeons:  Con Clunes, MD and Lurena Red, MD   Anesthesiologist:  Epifanio, MD  Echocardiographer:  Santo, MD  Pre-operative Echo Findings: Severe aortic stenosis Normal left ventricular systolic function  Post-operative Echo Findings: Trivial paravalvular leak Normal left ventricular systolic function   BRIEF CLINICAL NOTE AND INDICATIONS FOR SURGERY  Chad Morrison is a 89 y.o. male presents for surgical evaluation of critical aortic stenosis.  He has been followed by cardiology and over the past several months he has developed progressive shortness of breath and leg weakness while walking.   DETAILS OF THE OPERATIVE PROCEDURE  PREPARATION:    The patient was brought to the operating room on the above mentioned date and appropriate monitoring was established by the anesthesia team. The patient was placed in the supine position on the operating table.  Intravenous antibiotics were administered. The patient was monitored closely throughout the procedure under conscious sedation.  Baseline transthoracic echocardiogram was performed. The patient's abdomen and both groins were prepped and draped in a sterile manner. A time out procedure was performed.   PERIPHERAL ACCESS:    Using the modified Seldinger technique, right internal jugular venous access was obtained with placement of 6 Fr sheath.  A temporary transvenous pacemaker catheter was passed through the right IJ sheath under fluoroscopic guidance into the right ventricle.  The pacemaker was tested to ensure stable lead placement and  pacemaker capture. Left femoral arterial access was obtained with placement of 6 Fr sheath.  A pigtail diagnostic catheter was passed through the left arterial sheath under fluoroscopic guidance into the aortic root.   Aortic root angiography was performed in order to determine the optimal angiographic angle for valve deployment.   TRANSFEMORAL ACCESS:   Percutaneous transfemoral access and sheath placement was performed using ultrasound guidance.  The right common femoral artery was cannulated using a micropuncture needle and appropriate location was verified using hand injection angiogram.  A pair of Abbott Perclose percutaneous closure devices were placed and a 6 French sheath replaced into the femoral artery.  The patient was heparinized systemically and ACT verified > 250 seconds.    A 14 Fr transfemoral E-sheath was introduced into the right common femoral artery after progressively dilating over an Amplatz superstiff wire. An AL-1 catheter was used to direct a straight-tip exchange length wire across the native aortic valve into the left ventricle. This was exchanged out for a pigtail catheter and position was confirmed in the LV apex. Simultaneous LV and Ao pressures were recorded.  The LVEDP was 13 mmHg.  The pigtail catheter was exchanged for a Safari wire in the LV apex.   TRANSCATHETER HEART VALVE DEPLOYMENT:   An Edwards Sapien 3 Ultra transcatheter heart valve (26 mm) was prepared and crimped per manufacturer's guidelines, and the proper orientation of the valve was confirmed on the [employer] delivery system. The valve was advanced through the introducer sheath using normal technique until in an appropriate position in the abdominal aorta beyond the sheath tip. The balloon was then retracted and using the fine-tuning wheel was centered on the valve. The valve was then advanced across the aortic  arch using appropriate flexion of the catheter. The valve was carefully positioned across  the aortic valve annulus. The Commander catheter was retracted using normal technique. Once final position of the valve was confirmed by angiographic assessment, the valve was deployed during rapid ventricular pacing to maintain systolic blood pressure < 50 mmHg and pulse pressure < 10 mmHg. The balloon inflation was held for >3 seconds after reaching full deployment volume. Once the balloon was fully deflated the balloon was retracted into the ascending aorta and valve function was assessed using echocardiography. There was felt to be trivial paravalvular leak and no central aortic insufficiency.  The patient's hemodynamic recovery following valve deployment was good.  The deployment balloon and guidewire were both removed.   Immediately after deployment, the patient had no intrinsic heart rhythm.  He was V-paced at 60 bpm.  PROCEDURE COMPLETION:   The sheath was removed and femoral artery closure performed.  Protamine was administered once femoral arterial repair was complete. A Mynx femoral closure device was utilized following removal of the diagnostic sheath in the left femoral artery.  The patient tolerated the procedure well and was transported to 2H in stable condition. There were no immediate intraoperative complications. All sponge instrument and needle counts were verified correct at completion of the operation.   No blood products were administered during the operation.  The patient received a total of 65 mL of intravenous contrast during the procedure.   Con GORMAN Clunes, MD 12/26/2023 11:42 AM

## 2023-12-26 NOTE — Transfer of Care (Signed)
 Immediate Anesthesia Transfer of Care Note  Patient: Jaimeson Gopal  Procedure(s) Performed: Transcatheter Aortic Valve Replacement, Transfemoral (Right) ECHOCARDIOGRAM, TRANSTHORACIC  Patient Location: PACU  Anesthesia Type:MAC  Level of Consciousness: awake and drowsy  Airway & Oxygen Therapy: Patient Spontanous Breathing and Patient connected to face mask oxygen  Post-op Assessment: Report given to RN and Post -op Vital signs reviewed and stable  Post vital signs: Reviewed and stable  Last Vitals:  Vitals Value Taken Time  BP 143/70 12/26/23   11:43  Temp    Pulse 62 12/26/23 11:43  Resp 17 12/26/23 11:43  SpO2 99 % 12/26/23 11:43  Vitals shown include unfiled device data.  Last Pain:  Vitals:   12/26/23 0741  TempSrc:   PainSc: 0-No pain         Complications: There were no known notable events for this encounter.

## 2023-12-27 ENCOUNTER — Inpatient Hospital Stay (HOSPITAL_COMMUNITY)

## 2023-12-27 ENCOUNTER — Encounter (HOSPITAL_COMMUNITY): Payer: Self-pay | Admitting: Internal Medicine

## 2023-12-27 DIAGNOSIS — I442 Atrioventricular block, complete: Secondary | ICD-10-CM

## 2023-12-27 DIAGNOSIS — Z952 Presence of prosthetic heart valve: Secondary | ICD-10-CM

## 2023-12-27 DIAGNOSIS — I10 Essential (primary) hypertension: Secondary | ICD-10-CM

## 2023-12-27 DIAGNOSIS — I350 Nonrheumatic aortic (valve) stenosis: Secondary | ICD-10-CM | POA: Diagnosis not present

## 2023-12-27 DIAGNOSIS — I4510 Unspecified right bundle-branch block: Secondary | ICD-10-CM

## 2023-12-27 DIAGNOSIS — I4821 Permanent atrial fibrillation: Secondary | ICD-10-CM | POA: Diagnosis not present

## 2023-12-27 LAB — CBC
HCT: 33.9 % — ABNORMAL LOW (ref 39.0–52.0)
Hemoglobin: 11 g/dL — ABNORMAL LOW (ref 13.0–17.0)
MCH: 28.4 pg (ref 26.0–34.0)
MCHC: 32.4 g/dL (ref 30.0–36.0)
MCV: 87.4 fL (ref 80.0–100.0)
Platelets: 124 K/uL — ABNORMAL LOW (ref 150–400)
RBC: 3.88 MIL/uL — ABNORMAL LOW (ref 4.22–5.81)
RDW: 13.8 % (ref 11.5–15.5)
WBC: 6 K/uL (ref 4.0–10.5)
nRBC: 0 % (ref 0.0–0.2)

## 2023-12-27 LAB — HEPARIN LEVEL (UNFRACTIONATED): Heparin Unfractionated: 0.35 [IU]/mL (ref 0.30–0.70)

## 2023-12-27 LAB — BASIC METABOLIC PANEL WITH GFR
Anion gap: 9 (ref 5–15)
BUN: 16 mg/dL (ref 8–23)
CO2: 24 mmol/L (ref 22–32)
Calcium: 9 mg/dL (ref 8.9–10.3)
Chloride: 102 mmol/L (ref 98–111)
Creatinine, Ser: 1.04 mg/dL (ref 0.61–1.24)
GFR, Estimated: >60 mL/min (ref >60)
Glucose, Bld: 88 mg/dL (ref 70–99)
Potassium: 4.4 mmol/L (ref 3.5–5.1)
Sodium: 135 mmol/L (ref 135–145)

## 2023-12-27 LAB — MAGNESIUM: Magnesium: 1.9 mg/dL (ref 1.7–2.4)

## 2023-12-27 LAB — ECHOCARDIOGRAM COMPLETE
AR max vel: 2.12 cm2
AV Area VTI: 2.22 cm2
AV Area mean vel: 2.08 cm2
AV Mean grad: 6.4 mmHg
AV Peak grad: 11.3 mmHg
Ao pk vel: 1.68 m/s
Area-P 1/2: 5.11 cm2
Calc EF: 50.3 %
Height: 69 in
MV VTI: 2.58 cm2
S' Lateral: 2.8 cm
Single Plane A2C EF: 49.3 %
Single Plane A4C EF: 53.2 %
Weight: 2186.96 [oz_av]

## 2023-12-27 LAB — APTT: aPTT: 104 s — ABNORMAL HIGH (ref 24–36)

## 2023-12-27 MED ORDER — CHLORHEXIDINE GLUCONATE CLOTH 2 % EX PADS
6.0000 | MEDICATED_PAD | Freq: Every day | CUTANEOUS | Status: DC
  Administered 2023-12-28: 6 via TOPICAL

## 2023-12-27 MED ORDER — ORAL CARE MOUTH RINSE: 15.0000 mL | OROMUCOSAL | Status: DC | PRN

## 2023-12-27 MED ORDER — DILTIAZEM HCL ER COATED BEADS 180 MG PO CP24
180.0000 mg | ORAL_CAPSULE | Freq: Every day | ORAL | Status: DC
Start: 2023-12-27 — End: 2023-12-29
  Administered 2023-12-27 – 2023-12-29 (×3): 180 mg via ORAL
  Filled 2023-12-27 (×3): qty 1

## 2023-12-27 MED ORDER — LOSARTAN POTASSIUM 25 MG PO TABS
25.0000 mg | ORAL_TABLET | Freq: Every day | ORAL | Status: DC
  Administered 2023-12-27: 25 mg via ORAL
  Filled 2023-12-27: qty 1

## 2023-12-27 MED ORDER — MAGNESIUM SULFATE 2 GM/50ML IV SOLN
2.0000 g | Freq: Once | INTRAVENOUS | Status: AC
  Administered 2023-12-27: 2 g via INTRAVENOUS
  Filled 2023-12-27: qty 50

## 2023-12-27 MED ORDER — HEPARIN (PORCINE) 25000 UT/250ML-% IV SOLN
1000.0000 [IU]/h | INTRAVENOUS | Status: DC
  Administered 2023-12-27: 1000 [IU]/h via INTRAVENOUS
  Filled 2023-12-27: qty 250

## 2023-12-27 MED ORDER — DILTIAZEM HCL ER COATED BEADS 180 MG PO CP24
180.0000 mg | ORAL_CAPSULE | Freq: Every day | ORAL | Status: DC
Start: 2023-12-27 — End: 2023-12-27
  Filled 2023-12-27: qty 1

## 2023-12-27 NOTE — Progress Notes (Signed)
 ANTICOAGULATION CONSULT NOTE  Pharmacy Consult for heparin  Indication: atrial fibrillation (PTA Eliquis )  Allergies  Allergen Reactions   Penicillins Hives    Has patient had a PCN reaction causing immediate rash, facial/tongue/throat swelling, SOB or lightheadedness with hypotension: No Has patient had a PCN reaction causing severe rash involving mucus membranes or skin necrosis: No Has patient had a PCN reaction that required hospitalization: No Has patient had a PCN reaction occurring within the last 10 years: Yes If all of the above answers are NO, then may proceed with Cephalosporin use.    Latex Hives and Rash   Sulfa Antibiotics Nausea Only        Sulfamethoxazole-Trimethoprim Nausea Only    Patient Measurements: Height: 5' 9 (175.3 cm) Weight: 62 kg (136 lb 11 oz) IBW/kg (Calculated) : 70.7 Heparin  Dosing Weight: 64 kg   Vital Signs: Temp: 97.9 F (36.6 C) (10/29 0753) Temp Source: Oral (10/29 0753) BP: 133/87 (10/29 0930) Pulse Rate: 87 (10/29 0700)  Labs: Recent Labs    12/26/23 1114 12/27/23 0206  HGB 9.2* 11.0*  HCT 27.0* 33.9*  PLT  --  124*  CREATININE 1.00 1.04    Estimated Creatinine Clearance: 42.2 mL/min (by C-G formula based on SCr of 1.04 mg/dL).  Medical History: Past Medical History:  Diagnosis Date   Anxiety    Asthma    Asthmatic bronchitis    Atrial flutter (HCC)    chads2vasc score is 3   Ehrlichiosis 2008   GERD (gastroesophageal reflux disease)    Granulomatosis with polyangiitis (HCC)    HTN (hypertension)    Hyperlipidemia    Nuclear sclerotic cataract of left eye 06/04/2019   Cataract surgery completed March 2023, Dr. 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 Wendel and Dr. Daniel   Seasonal allergies     Medications:  See [facility]  PTA  Anticoagulation: Eliquis  5 mg BID (age 89yo) - last dose 12/24/23   Assessment: 52 yoM presents for TAVR 10/27 complicated by CHB initially pacer dependent but later had spontaneous recovery to Afib, no further AVB since 10/28.  PMH significant for underlying RBBB, permanent atrial fibrillation on Eliquis  PTA.  Pharmacy consulted for heparin  bridge with possible plans for PPM.   Baseline CBC stable - Hgb 11, pltc 124.  Goal of Therapy:  Heparin  level 0.3-0.7 units/ml aPTT 66-103 seconds Monitor platelets by anticoagulation protocol: Yes  Plan:  Start heparin  infusion 1000 units/hr, no bolus with recent procedure 8 hour anti-Xa level Daily CBC, anti-Xa level Monitor for s/sx of bleeding  Maurilio Fila, PharmD Clinical Pharmacist 12/27/2023  10:29 AM

## 2023-12-27 NOTE — Consult Note (Addendum)
 ELECTROPHYSIOLOGY CONSULT NOTE    Patient ID: Chad Morrison MRN: [id number], DOB/AGE: 07-01-1932 53 y.o.  Admit date: 12/26/2023 Date of Consult: 12/27/2023  Primary Physician: Tisovec, Richard W, MD Primary Cardiologist: Lurena MARLA Red, MD  Electrophysiologist: Dr. Kennyth   Referring Provider: Dr. Red  Patient Profile: Chad Morrison is a 89 y.o. male with a history of HTN, HDL, permanent atrial fibrillation on Eliquis , Wegener's granulomatosis (on immunotherapy and low dose prednisone ), CKD stage II, limited mobility d/t bilateral hammertoes, aortic atherosclerosis and critical AS who is being seen today for the evaluation of transient complete heart block with baseline RBBB following TAVR at the request of Dr. Red.  HPI:  Chad Morrison is a 89 y.o. male with above noted medical history. Patient has been following with Dr. Red for some time but was largely asymptomatic with AS and quite active. This year he developed progressive exertional dyspnea and fatigue. Echo 10/23/23 showed EF 50%, and critical AS with mean grad 52 mmHg, AVA 0.45 cm2, DVI 0.16, SVI 30. He was admitted following successful TAVR with 26mm Sapien on 10/28. Following the procedure, patient with underlying RBBB developed CHB. He was initially pacer dependant but had spontaneous recovery of intrinsic rhythm with afib. Overnight telemetry shows that patient has continued in afib without recurrent AV block.   He denies chest pain, palpitations, dyspnea, PND, orthopnea, nausea, vomiting, dizziness, syncope, edema, weight gain, or early satiety.   Labs Potassium4.4 (10/29 0206) Magnesium   1.9 (10/29 0206) Creatinine, ser  1.04 (10/29 0206) PLT  124* (10/29 0206) HGB  11.0* (10/29 0206) WBC 6.0 (10/29 0206)  .    Past Medical History:  Diagnosis Date   Anxiety    Asthma    Asthmatic bronchitis    Atrial flutter (HCC)    chads2vasc score is 3   Ehrlichiosis 2008   GERD (gastroesophageal reflux disease)     Granulomatosis with polyangiitis (HCC)    HTN (hypertension)    Hyperlipidemia    Nuclear sclerotic cataract of left eye 06/04/2019   Cataract surgery completed March 2023, Dr. 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 Red and Dr. Daniel   Seasonal allergies      Surgical History:  Past Surgical History:  Procedure Laterality Date   BASAL CELL CARCINOMA EXCISION  2013   EYELID   HERNIA REPAIR Bilateral 2000, 2002   INTRAOPERATIVE TRANSTHORACIC ECHOCARDIOGRAM N/A 12/26/2023   Procedure: ECHOCARDIOGRAM, TRANSTHORACIC;  Surgeon: Red Lurena MARLA, MD;  Location: [facility] INVASIVE CV LAB;  Service: Cardiovascular;  Laterality: N/A;   KNEE ARTHROSCOPY  2000   prostectomy  1995   RADICAL   RIGHT/LEFT HEART CATH AND CORONARY ANGIOGRAPHY N/A 11/06/2023   Procedure: RIGHT/LEFT HEART CATH AND CORONARY ANGIOGRAPHY;  Surgeon: Red Lurena MARLA, MD;  Location: [facility] INVASIVE CV LAB;  Service: Cardiovascular;  Laterality: N/A;   TONSILLECTOMY  1940     Facility-Administered Medications Prior to Admission  Medication Dose Route Frequency Provider Last Rate Last Admin   Benralizumab  SOSY 30 mg  30 mg Subcutaneous Q8 Weeks Kozlow, Eric J, MD   30 mg at 12/19/23 1033   Medications Prior to Admission  Medication Sig Dispense Refill Last Dose/Taking   acetaminophen  (TYLENOL ) 500 MG tablet Take 500 mg by mouth every 6 (six) hours as needed (pain/restless leg).  12/25/2023   ALPRAZolam  (XANAX ) 0.25 MG tablet Take 0.125 mg by mouth at bedtime as needed for sleep.   12/25/2023   apixaban  (ELIQUIS ) 5 MG TABS tablet Take 1 tablet (5 mg total) by mouth 2 (two) times daily. 180 tablet 1 12/24/2023   Ascorbic Acid (VITAMIN C PO) Take 1 tablet by mouth every morning.   Past Week   Azelastine  HCl 137 MCG/SPRAY SOLN Place 2 sprays into both  nostrils 2 (two) times daily. 90 mL 1 12/25/2023   Benralizumab  (FASENRA  [place]) Inject into the skin every 8 (eight) weeks.   Past Month   brimonidine  (ALPHAGAN ) 0.2 % ophthalmic solution Place 1 drop into the right eye 2 (two) times daily.   12/25/2023   Calcium  Carbonate-Vitamin D (CALCIUM -VITAMIN D) 600-125 MG-UNIT TABS Take 1 tablet by mouth 2 (two) times daily.    Past Week   diltiazem  (CARDIZEM  CD) 180 MG 24 hr capsule TAKE 1 CAPSULE EVERY       MORNING AND AT BEDTIME 180 capsule 3 12/25/2023   diphenhydrAMINE  (BENADRYL ) 25 MG tablet Take 25 mg by mouth daily as needed for allergies.   Past Week   EPINEPHrine  0.3 mg/0.3 mL IJ SOAJ injection SMARTSIG:0.3 Milligram(s) IM Once (Patient taking differently: Inject 0.3 mg into the muscle as needed for anaphylaxis. SMARTSIG:0.3 Milligram(s) IM Once) 1 each 1 Taking Differently   famotidine (PEPCID) 20 MG tablet Take 20 mg by mouth at bedtime.   12/25/2023   Ferrous Sulfate  Dried (SLOW RELEASE IRON) 45 MG TBCR Take 45 mg by mouth daily.   Past Week   levocetirizine (XYZAL ) 5 MG tablet TAKE 1 TABLET EVERY EVENING 90 tablet 1 12/25/2023   levothyroxine  (SYNTHROID ) 88 MCG tablet Take 88 mcg by mouth daily before breakfast.   12/25/2023   montelukast  (SINGULAIR ) 10 MG tablet Take 1 tablet (10 mg total) by mouth at bedtime. 90 tablet 1 12/25/2023   Multiple Vitamin (MULTIVITAMIN WITH MINERALS) TABS tablet Take 1 tablet daily by mouth.   Past Week   Multiple Vitamins-Minerals (OCUVITE PRESERVISION PO) Take 1 tablet 2 (two) times daily by mouth.   Past Week   pantoprazole  (PROTONIX ) 40 MG tablet Take 40 mg by mouth every morning.   12/25/2023   Polyethyl Glycol-Propyl Glycol (SYSTANE OP) Place 1 drop into both eyes 3 (three) times daily as needed (dry/irritated eyes.).   12/25/2023   rosuvastatin  (CRESTOR ) 5 MG tablet Take 1 tablet (5 mg total) by mouth daily. 90 tablet 2 12/25/2023   SYMBICORT  160-4.5 MCG/ACT inhaler Inhale 2 puffs into the lungs 2 (two) times  daily. 30.6 g 1 12/26/2023 at  5:30 AM   albuterol  (VENTOLIN  HFA) 108 (90 Base) MCG/ACT inhaler Inhale 2 puffs into the lungs every 4 (four) hours as needed for wheezing or shortness of breath. 18 g 1 More than a month    Inpatient Medications:   azelastine   2 spray Each Nare BID   brimonidine   1 drop Right Eye BID   diltiazem   180 mg Oral Daily   famotidine  20 mg Oral QHS   levothyroxine   88 mcg Oral QAC breakfast   loratadine   10 mg Oral QPM   losartan  25 mg Oral Daily   montelukast   10 mg Oral QHS   pantoprazole   40 mg Oral q morning   rosuvastatin   5 mg Oral Daily   sodium chloride  flush  3 mL Intravenous Q12H    Allergies:  Allergies  Allergen Reactions   Penicillins Hives  Has patient had a PCN reaction causing immediate rash, facial/tongue/throat swelling, SOB or lightheadedness with hypotension: No Has patient had a PCN reaction causing severe rash involving mucus membranes or skin necrosis: No Has patient had a PCN reaction that required hospitalization: No Has patient had a PCN reaction occurring within the last 10 years: Yes If all of the above answers are NO, then may proceed with Cephalosporin use.    Latex Hives and Rash   Sulfa Antibiotics Nausea Only        Sulfamethoxazole-Trimethoprim Nausea Only    Family History  Problem Relation Age of Onset   Asthma Mother 32   Allergies Mother    Ulcers Mother    CVA Father 64   Heart Problems Father    Other Father        NEUROLOGIC ISSUES    Other Brother 60       BICYCLE ACCIDENT   Migraines Son    Diabetes Mellitus I Son      Physical Exam: Vitals:   12/27/23 0610 12/27/23 0616 12/27/23 0700 12/27/23 0753  BP: (!) 160/90 (!) 166/83 (!) 174/96   Pulse: 93 92 87   Resp: (!) 21 11 12    Temp:    97.9 F (36.6 C)  TempSrc:    Oral  SpO2: 96% 96% 95%   Weight:      Height:        GEN- NAD, A&O x 3, normal affect HEENT: Normocephalic, atraumatic Lungs- Normal effort.  Heart- Irregularly  irregular rate and rhythm, No M/G/R.  GI- Soft, NT, ND.  Extremities- No clubbing, cyanosis, or edema   Radiology/Studies: ECHOCARDIOGRAM LIMITED Result Date: 12/26/2023    ECHOCARDIOGRAM LIMITED REPORT   Patient Name:   Chad Morrison Date of Exam: 12/26/2023 Medical Rec #:  [id number]   Height:       69.0 in Accession #:    [id number]  Weight:       140.0 lb Date of Birth:  [birth year]    BSA:          1.775 m Patient Age:    89 years    BP:           169/76 mmHg Patient Gender: M           HR:           86 bpm. Exam Location:  Inpatient Procedure: Limited Echo, Cardiac Doppler and Color Doppler (Both Spectral and            Color Flow Doppler were utilized during procedure). Indications:     Aortic Stenosis i35.0  History:         Patient has prior history of Echocardiogram examinations, most                  recent 10/23/2023. COPD; Risk Factors:Hypertension and                  Dyslipidemia.  Sonographer:     Damien Senior RDCS Referring Phys:  [id number] LURENA MARLA RED Diagnosing Phys: Stanly Leavens MD  Sonographer Comments: 26mm Edwards S3U TAVR Implanted IMPRESSIONS  1. Left ventricular ejection fraction, by estimation, is 50%. The left ventricle has low normal function.  2. Right ventricular systolic function is low normal.  3. The mitral valve is degenerative. Mild to moderate mitral valve regurgitation. Moderate mitral annular calcification.  4. Prior to procedure, Critical aortic stenosis. Mean gradient 54 mm Hg. Peak gradient 77 mm Hg, DVI 0.15, AVA .  43 cm2. Mild, central, aortic regurgitation.     After procedure a 26 mm Sapien valve was placed. Trivial PVL adjacent to the interventricular septum. Mean gradient 3 mm Hg, Peak gradient 7 mm Hg, DVI 0.62, EOA 1.42 cm2. Echo findings are consistent with normal structure and function of the aortic valve prosthesis. FINDINGS  Left Ventricle: Left ventricular ejection fraction, by estimation, is 50%. The left ventricle has low normal function. Right  Ventricle: Right ventricular systolic function is low normal. Mitral Valve: The mitral valve is degenerative in appearance. Moderate mitral annular calcification. Mild to moderate mitral valve regurgitation. Aortic Valve: Prior to procedure, Critical aortic stenosis. Mean gradient 54 mm Hg. Peak gradient 77 mm Hg, DVI 0.15, AVA .43 cm2. Mild, central, aortic regurgitation. After procedure a 26 mm Sapien valve was placed. Trivial PVL adjacent to the interventricular septum. Mean gradient 3 mm Hg, Peak gradient 7 mm Hg, DVI 0.62, EOA 1.42 cm2. Aortic valve mean gradient measures 22.5 mmHg. Aortic valve peak gradient measures  27.8 mmHg. Aortic valve area, by VTI measures 0.86 cm. Echo findings are consistent with normal structure and function of the aortic valve prosthesis. Additional Comments: Spectral Doppler performed. Color Doppler performed.  LEFT VENTRICLE PLAX 2D LVOT diam:     2.00 cm LV SV:         53 LV SV Index:   30 LVOT Area:     3.14 cm  AORTIC VALVE AV Area (Vmax):    0.87 cm AV Area (Vmean):   0.82 cm AV Area (VTI):     0.86 cm AV Vmax:           263.50 cm/s AV Vmean:          199.850 cm/s AV VTI:            0.617 m AV Peak Grad:      27.8 mmHg AV Mean Grad:      22.5 mmHg LVOT Vmax:         73.10 cm/s LVOT Vmean:        52.200 cm/s LVOT VTI:          0.169 m LVOT/AV VTI ratio: 0.27  SHUNTS Systemic VTI:  0.17 m Systemic Diam: 2.00 cm Stanly Leavens MD Electronically signed by Stanly Leavens MD Signature Date/Time: 12/26/2023/3:47:57 PM    Final    Structural Heart Procedure Result Date: 12/26/2023 See surgical note for result.  DG Chest 2 View Result Date: 12/22/2023 EXAM: 2 VIEW(S) XRAY OF THE CHEST 12/22/2023 09:59:00 AM COMPARISON: 05/30/2022 CLINICAL HISTORY: Pre-op exam, Aortic stenosis; Hx of smoking. FINDINGS: LUNGS AND PLEURA: Pulmonary hyperinflation and central peribronchial thickening, consistent with COPD. Stable left lower lobe scarring. No focal pulmonary  opacity. No pulmonary edema. No pleural effusion. No pneumothorax. HEART AND MEDIASTINUM: Stable mild cardiomegaly. BONES AND SOFT TISSUES: Lower thoracic spine degenerative disc disease. No acute osseous abnormality. IMPRESSION: 1. COPD and chronic left lower lobe scarring. No active lung disease. 2. Stable mild cardiomegaly. Electronically signed by: Norleen Kil MD 12/22/2023 02:42 PM EDT RP Workstation: [id number]    EKG: All three inpatient ECG tracings show afib with ventricular rate 60s-80s. RBBB morphology (personally reviewed)  TELEMETRY: Overnight telemetry with persistent afib, rates 80s-90s (personally reviewed)  DEVICE HISTORY: n/a  Assessment/Plan:  Baseline RBBB Complete heart block s/p TAVR Permanent atrial fibrillation Transient complete heart block following successful TAVR on 12/26/23 with Dr. Wendel. No recurrent heart block since, now persistent afib with increasing rates.  Given RBBB at baseline  along with temporary CHB postprocedure, risk of recurrent HB is elevated. Additionally, with permanent afib and increasing ventricular rates today off PTA Cardizem , potential for tachy-brady. Patient has received home Cardizem  today, will monitor for recurrent HB on telemetry with temp pacing backup and reassess need for PPM tomorrow morning. Empiric clear liquid diet after midnight for possible procedure. Continue to hold Eliquis . Okay to bridge with heparin .   Severe AS s/p TAVR Successful TAVR on 10/28 with 26mm Sapien 3. Echo completed today but not yet read.  Hypertension BP elevated today off home meds. Amlodipine started by primary team yesterday, switched to Losartan 25mg .  Hyperlipidemia Continue Crestor  5mg  daily.       For questions or updates, please contact [facility] Please consult [email or web address] for contact info under     Signed, Artist Pouch, PA-C  12/27/2023, 9:20 AM     I have seen, examined the patient, and reviewed the above  assessment and plan.    HPI: Mr. Bossi is an 89 year old male with past medical history notable for COPD, permanent atrial fibrillation, hypertension, hyperlipidemia, right bundle branch block, severe AS who underwent TAVR on 12/26/23.  Patient had complete heart block following valve deployment so temporary pacing wire was left in place.  He was transferred to the cardiac ICU for monitoring.  By the time he arrived in the cardiac ICU, he had recovered conduction.  He has been atrial fibrillation conducting irregularly overnight and this morning.  He reports feeling relatively well this morning.  He is concerned about needing a pacemaker.  Otherwise no new or acute complaints.  General: Well developed, in no acute distress.  Neck: No JVD.  Cardiac: Normal rate, irregular rhythm.  Resp: Normal work of breathing.  Ext: No edema.  Neuro: No gross focal deficits.  Psych: Normal affect.   Problem List:  Postoperative complete heart block Baseline right bundle branch block Permanent atrial fibrillation Hypercoagulable state due to AF  Assessment and Plan: [name] is an 89 year old male with baseline permanent atrial fibrillation right bundle branch block who underwent TAVR and had complete heart block immediately following valve deployment.  He appears to have recovered conduction relatively quickly.  He is now in atrial fibrillation conducting a regularly.  Given his baseline right bundle branch block, he is high risk.  He will continue to monitor him closely to ensure that conduction is stable.  We will resume his home diltiazem  and monitor with temporary pacing wire in place.  Final decision regarding permanent pacemaker implant will be based on stability of conduction over the next 24 hours.  In the interim, continue holding Eliquis .  Okay to bridge with heparin .  Fonda Kitty, MD 12/27/2023 10:06 PM

## 2023-12-27 NOTE — Progress Notes (Signed)
 Discussed with pt restrictions, walking for exercise at d/c, and CRPII. Will refer to [facility] CRPII. Pt is in agreement. He walks with his wife and does resistance training. [id number] Aliene Aris BS, ACSM-CEP 12/27/2023 11:50 AM

## 2023-12-27 NOTE — Progress Notes (Signed)
 ANTICOAGULATION CONSULT NOTE  Pharmacy Consult for heparin  Indication: atrial fibrillation (PTA Eliquis )  Allergies  Allergen Reactions   Penicillins Hives    Has patient had a PCN reaction causing immediate rash, facial/tongue/throat swelling, SOB or lightheadedness with hypotension: No Has patient had a PCN reaction causing severe rash involving mucus membranes or skin necrosis: No Has patient had a PCN reaction that required hospitalization: No Has patient had a PCN reaction occurring within the last 10 years: Yes If all of the above answers are NO, then may proceed with Cephalosporin use.    Latex Hives and Rash   Sulfa Antibiotics Nausea Only        Sulfamethoxazole-Trimethoprim Nausea Only    Patient Measurements: Height: 5' 9 (175.3 cm) Weight: 62 kg (136 lb 11 oz) IBW/kg (Calculated) : 70.7 Heparin  Dosing Weight: 64 kg   Vital Signs: Temp: 98.1 F (36.7 C) (10/29 1930) Temp Source: Oral (10/29 1930) BP: 156/86 (10/29 2200) Pulse Rate: 83 (10/29 2200)  Labs: Recent Labs    12/26/23 1114 12/27/23 0206 12/27/23 2119  HGB 9.2* 11.0*  --   HCT 27.0* 33.9*  --   PLT  --  124*  --   APTT  --   --  104*  HEPARINUNFRC  --   --  0.35  CREATININE 1.00 1.04  --     Estimated Creatinine Clearance: 42.2 mL/min (by C-G formula based on SCr of 1.04 mg/dL).  Medical History: Past Medical History:  Diagnosis Date   Anxiety    Asthma    Asthmatic bronchitis    Atrial flutter (HCC)    chads2vasc score is 3   Ehrlichiosis 2008   GERD (gastroesophageal reflux disease)    Granulomatosis with polyangiitis (HCC)    HTN (hypertension)    Hyperlipidemia    Nuclear sclerotic cataract of left eye 06/04/2019   Cataract surgery completed March 2023, Dr. 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  Wendel and Dr. Daniel   Seasonal allergies     Medications:  See [facility]  PTA Anticoagulation: Eliquis  5 mg BID (age 89yo) - last dose 12/24/23   Assessment: 35 yoM presents for TAVR 10/27 complicated by CHB initially pacer dependent but later had spontaneous recovery to Afib, no further AVB since 10/28.  PMH significant for underlying RBBB, permanent atrial fibrillation on Eliquis  PTA (last dose given on 10/26).  Pharmacy consulted for heparin  bridge with possible plans for PPM.   -heparin  level 0.35 and at goal on 1000 units/hr   Goal of Therapy:  Heparin  level 0.3-0.7 units/ml aPTT 66-103 seconds Monitor platelets by anticoagulation protocol: Yes  Plan:  -Continue heparin  1000 units/hr -Daily heparin  level and CBC  Prentice Poisson, PharmD Clinical Pharmacist **Pharmacist phone directory can now be found on [email or web address] ([email or web address]).  Listed under [facility].

## 2023-12-28 ENCOUNTER — Encounter (HOSPITAL_COMMUNITY)
Admission: RE | Disposition: A | Discharge status: Home Health | Payer: Self-pay | Source: Home / Self Care | Attending: Internal Medicine

## 2023-12-28 ENCOUNTER — Telehealth: Payer: Self-pay | Admitting: Physician Assistant

## 2023-12-28 ENCOUNTER — Encounter: Payer: Self-pay | Admitting: Internal Medicine

## 2023-12-28 DIAGNOSIS — I442 Atrioventricular block, complete: Secondary | ICD-10-CM | POA: Diagnosis not present

## 2023-12-28 DIAGNOSIS — I10 Essential (primary) hypertension: Secondary | ICD-10-CM | POA: Diagnosis not present

## 2023-12-28 DIAGNOSIS — I495 Sick sinus syndrome: Secondary | ICD-10-CM

## 2023-12-28 DIAGNOSIS — I350 Nonrheumatic aortic (valve) stenosis: Secondary | ICD-10-CM | POA: Diagnosis not present

## 2023-12-28 DIAGNOSIS — Z952 Presence of prosthetic heart valve: Secondary | ICD-10-CM | POA: Diagnosis not present

## 2023-12-28 HISTORY — PX: PACEMAKER IMPLANT: EP1218

## 2023-12-28 LAB — CBC
HCT: 30.7 % — ABNORMAL LOW (ref 39.0–52.0)
Hemoglobin: 10.4 g/dL — ABNORMAL LOW (ref 13.0–17.0)
MCH: 28.7 pg (ref 26.0–34.0)
MCHC: 33.9 g/dL (ref 30.0–36.0)
MCV: 84.8 fL (ref 80.0–100.0)
Platelets: 94 K/uL — ABNORMAL LOW (ref 150–400)
RBC: 3.62 MIL/uL — ABNORMAL LOW (ref 4.22–5.81)
RDW: 13.2 % (ref 11.5–15.5)
WBC: 5 K/uL (ref 4.0–10.5)
nRBC: 0 % (ref 0.0–0.2)

## 2023-12-28 LAB — BASIC METABOLIC PANEL WITH GFR
Anion gap: 13 (ref 5–15)
BUN: 11 mg/dL (ref 8–23)
CO2: 22 mmol/L (ref 22–32)
Calcium: 8.6 mg/dL — ABNORMAL LOW (ref 8.9–10.3)
Chloride: 96 mmol/L — ABNORMAL LOW (ref 98–111)
Creatinine, Ser: 0.85 mg/dL (ref 0.61–1.24)
GFR, Estimated: >60 mL/min (ref >60)
Glucose, Bld: 93 mg/dL (ref 70–99)
Potassium: 4 mmol/L (ref 3.5–5.1)
Sodium: 131 mmol/L — ABNORMAL LOW (ref 135–145)

## 2023-12-28 LAB — HEPARIN LEVEL (UNFRACTIONATED): Heparin Unfractionated: 0.39 [IU]/mL (ref 0.30–0.70)

## 2023-12-28 LAB — MAGNESIUM: Magnesium: 2 mg/dL (ref 1.7–2.4)

## 2023-12-28 SURGERY — PACEMAKER IMPLANT

## 2023-12-28 MED ORDER — SODIUM CHLORIDE 0.9 % IV SOLN
80.0000 mg | INTRAVENOUS | Status: AC
  Administered 2023-12-28: 80 mg
  Filled 2023-12-28: qty 2

## 2023-12-28 MED ORDER — CEFAZOLIN SODIUM-DEXTROSE 2-4 GM/100ML-% IV SOLN
2.0000 g | INTRAVENOUS | Status: AC
  Administered 2023-12-28: 2 g via INTRAVENOUS

## 2023-12-28 MED ORDER — FENTANYL CITRATE (PF) 100 MCG/2ML IJ SOLN
INTRAMUSCULAR | Status: AC
  Filled 2023-12-28: qty 2

## 2023-12-28 MED ORDER — HEPARIN (PORCINE) IN NACL 1000-0.9 UT/500ML-% IV SOLN
INTRAVENOUS | Status: DC | PRN
  Administered 2023-12-28: 500 mL

## 2023-12-28 MED ORDER — SODIUM CHLORIDE 0.9 % IV SOLN: INTRAVENOUS | Status: DC

## 2023-12-28 MED ORDER — CHLORHEXIDINE GLUCONATE 4 % EX SOLN: 60.0000 mL | Freq: Once | CUTANEOUS | Status: DC

## 2023-12-28 MED ORDER — SODIUM CHLORIDE 0.9% FLUSH: 3.0000 mL | INTRAVENOUS | Status: DC | PRN

## 2023-12-28 MED ORDER — SODIUM CHLORIDE 0.9 % IV SOLN
INTRAVENOUS | Status: AC
  Filled 2023-12-28: qty 2

## 2023-12-28 MED ORDER — LIDOCAINE HCL (PF) 1 % IJ SOLN
INTRAMUSCULAR | Status: DC | PRN
Start: 2023-12-28 — End: 2023-12-28
  Administered 2023-12-28: 30 mL

## 2023-12-28 MED ORDER — MIDAZOLAM HCL 5 MG/5ML IJ SOLN
INTRAMUSCULAR | Status: DC | PRN
  Administered 2023-12-28: 1 mg via INTRAVENOUS

## 2023-12-28 MED ORDER — MIDAZOLAM HCL 2 MG/2ML IJ SOLN
INTRAMUSCULAR | Status: AC
  Filled 2023-12-28: qty 2

## 2023-12-28 MED ORDER — SODIUM CHLORIDE 0.9 % IV SOLN: 250.0000 mL | INTRAVENOUS | Status: DC

## 2023-12-28 MED ORDER — ORAL CARE MOUTH RINSE: 15.0000 mL | OROMUCOSAL | Status: DC | PRN

## 2023-12-28 MED ORDER — CEFAZOLIN SODIUM-DEXTROSE 2-4 GM/100ML-% IV SOLN
INTRAVENOUS | Status: AC
  Filled 2023-12-28: qty 100

## 2023-12-28 MED ORDER — SODIUM CHLORIDE 0.9% FLUSH
3.0000 mL | Freq: Two times a day (BID) | INTRAVENOUS | Status: DC
  Administered 2023-12-28 (×2): 3 mL via INTRAVENOUS

## 2023-12-28 MED ORDER — CHLORHEXIDINE GLUCONATE 4 % EX SOLN
60.0000 mL | Freq: Once | CUTANEOUS | Status: DC
  Filled 2023-12-28: qty 60

## 2023-12-28 MED ORDER — LOSARTAN POTASSIUM 25 MG PO TABS
25.0000 mg | ORAL_TABLET | Freq: Two times a day (BID) | ORAL | Status: DC
  Administered 2023-12-28 – 2023-12-29 (×3): 25 mg via ORAL
  Filled 2023-12-28 (×3): qty 1

## 2023-12-28 MED ORDER — FENTANYL CITRATE (PF) 100 MCG/2ML IJ SOLN
INTRAMUSCULAR | Status: DC | PRN
  Administered 2023-12-28: 50 ug via INTRAVENOUS

## 2023-12-28 SURGICAL SUPPLY — 12 items
CABLE SURGICAL S-101-97-12 (CABLE) ×1 IMPLANT
CATH SELECT PACE 669183 (CATHETERS) IMPLANT
CATHETER SSPC NXT 2.5 DELIVRY (CATHETERS) IMPLANT
CUTTER LV DELIVERY CATHETER 7 (MISCELLANEOUS) IMPLANT
LEAD INGEVITY 7842 59 (Lead) IMPLANT
PACEMAKER ACCOLADE SR (Pacemaker) IMPLANT
PAD DEFIB RADIO PHYSIO CONN (PAD) ×1 IMPLANT
POUCH AIGIS-R ANTIBACT PPM MED (Mesh General) IMPLANT
SHEATH 9FR PRELUDE SNAP 13 (SHEATH) IMPLANT
SHEATH PROBE COVER 6X72 (BAG) IMPLANT
TRAY PACEMAKER INSERTION (PACKS) ×1 IMPLANT
WIRE HI TORQ VERSACORE-J 145CM (WIRE) IMPLANT

## 2023-12-28 NOTE — Telephone Encounter (Signed)
 Patient's wife would like to know if Chad Morrison can review ED notes + lab results.

## 2023-12-28 NOTE — Interval H&P Note (Signed)
 History and Physical Interval Note:  12/28/2023 5:12 PM  Chad Morrison  has presented today for surgery, with the diagnosis of heart block and tachycardia-bradycardia syndrome.  The various methods of treatment have been discussed with the patient and family. After consideration of risks, benefits and other options for treatment, the patient has consented to  Procedure(s): PACEMAKER IMPLANT (N/A) as a surgical intervention.  The patient's history has been reviewed, patient examined, no change in status, stable for surgery.  I have reviewed the patient's chart and labs.  Questions were answered to the patient's satisfaction.     Fonda Kitty

## 2023-12-28 NOTE — Progress Notes (Signed)
 ANTICOAGULATION CONSULT NOTE  Pharmacy Consult for heparin  Indication: atrial fibrillation (PTA Eliquis )  Allergies  Allergen Reactions   Penicillins Hives    Has patient had a PCN reaction causing immediate rash, facial/tongue/throat swelling, SOB or lightheadedness with hypotension: No Has patient had a PCN reaction causing severe rash involving mucus membranes or skin necrosis: No Has patient had a PCN reaction that required hospitalization: No Has patient had a PCN reaction occurring within the last 10 years: Yes If all of the above answers are NO, then may proceed with Cephalosporin use.    Latex Hives and Rash   Sulfa Antibiotics Nausea Only        Sulfamethoxazole-Trimethoprim Nausea Only    Patient Measurements: Height: 5' 9 (175.3 cm) Weight: 58.8 kg (129 lb 10.1 oz) IBW/kg (Calculated) : 70.7 Heparin  Dosing Weight: 64 kg   Vital Signs: Temp: 97.8 F (36.6 C) (10/30 0836) Temp Source: Oral (10/30 0836) BP: 160/101 (10/30 0801) Pulse Rate: 88 (10/30 0801)  Labs: Recent Labs    12/26/23 1114 12/27/23 0206 12/27/23 2119 12/28/23 0410  HGB 9.2* 11.0*  --  10.4*  HCT 27.0* 33.9*  --  30.7*  PLT  --  124*  --  94*  APTT  --   --  104*  --   HEPARINUNFRC  --   --  0.35 0.39  CREATININE 1.00 1.04  --  0.85    Estimated Creatinine Clearance: 49 mL/min (by C-G formula based on SCr of 0.85 mg/dL).  Medical History: Past Medical History:  Diagnosis Date   Anxiety    Asthma    Asthmatic bronchitis    Atrial flutter (HCC)    chads2vasc score is 3   Ehrlichiosis 2008   GERD (gastroesophageal reflux disease)    Granulomatosis with polyangiitis (HCC)    HTN (hypertension)    Hyperlipidemia    Nuclear sclerotic cataract of left eye 06/04/2019   Cataract surgery completed March 2023, Dr. 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 Wendel and Dr. Daniel   Seasonal allergies     Medications:  See [facility]  PTA Anticoagulation: Eliquis  5 mg BID (age 89yo) - last dose 12/24/23   Assessment: 89 yoM presents for TAVR 10/27 complicated by CHB initially pacer dependent but later had spontaneous recovery to Afib, no further AVB since 10/28.  PMH significant for underlying RBBB, permanent atrial fibrillation on Eliquis  PTA.  Pharmacy consulted for heparin  bridge with possible plans for PPM.   Heparin  level 0.39 is at goal on 1000 units/hr.  Hgb 10.4, pltc 94 - trending down.  Noted plans for PPM tomorrow at 0730, possibly today pending lab availability.  Discussed with EP, will enter heparin  stop at midnight tonight in the event he doesn't go today.  Goal of Therapy:  Heparin  level 0.3-0.7 units/ml aPTT 66-103 seconds Monitor platelets by anticoagulation protocol: Yes  Plan:  Continue heparin  IV 1000 units/hr - stop time entered for midnight this evening Daily CBC, anti-Xa level Monitor for s/sx of bleeding  Maurilio Fila, PharmD Clinical Pharmacist 12/28/2023  9:52 AM

## 2023-12-28 NOTE — Progress Notes (Addendum)
 Patient Name: Chad Morrison Date of Encounter: 12/28/2023  Primary Cardiologist: Arun K Thukkani, MD Electrophysiologist: None  Interval Summary   The patient is doing well today. Is a bit frustrated about having to stay another night. At this time, the patient denies chest pain, shortness of breath, or any new concerns.  Vital Signs    Vitals:   12/28/23 0630 12/28/23 0645 12/28/23 0700 12/28/23 0702  BP: (!) 153/86 (!) 161/90 (!) 168/100 (!) 172/152  Pulse: 84 88 82 92  Resp: (!) 4 (!) 8 (!) 23 (!) 26  Temp:      TempSrc:      SpO2: 96% 96% 96% 97%  Weight:      Height:        Intake/Output Summary (Last 24 hours) at 12/28/2023 0718 Last data filed at 12/28/2023 9347 Gross per 24 hour  Intake 398.23 ml  Output 2025 ml  Net -1626.77 ml   Filed Weights   12/26/23 0728 12/27/23 0500 12/28/23 0500  Weight: 63.5 kg 62 kg 58.8 kg    Physical Exam    GEN- NAD, Alert and oriented  Lungs- Clear to ausculation bilaterally, normal work of breathing Cardiac- Irregularly irregular rate and rhythm, no murmurs, rubs or gallops GI- soft, NT, ND, + BS Extremities- no clubbing or cyanosis. No edema  Telemetry    Persistent afib with RBBB morphology, intermittent LBBB escape beats vs backup V pacing. Ventricular rates primarily 70s-80s overnight (personally reviewed)  Hospital Course    Chad Morrison is a 89 y.o. male with a history of HTN, HDL, permanent atrial fibrillation on Eliquis , Wegener's granulomatosis (on immunotherapy and low dose prednisone ), CKD stage II, limited mobility d/t bilateral hammertoes, aortic atherosclerosis and critical AS who is being seen today for the evaluation of transient complete heart block with baseline RBBB following TAVR at the request of Dr. Wendel. He was admitted following successful TAVR with 26mm Sapien on 10/28. Following the procedure, patient with underlying RBBB developed CHB. He was initially pacer dependant but had spontaneous recovery  of intrinsic rhythm with afib.   Assessment & Plan    Baseline RBBB Complete heart block s/p TAVR Permanent atrial fibrillation Transient complete heart block following successful TAVR on 12/26/23 with Dr. Wendel. Telemetry on the afternoon of 10/29 and overnight with infrequent pauses followed by LBBB escape morphology vs V paced backup from temp wire. Given RBBB at baseline along with temporary CHB postprocedure, risk of recurrent HB is elevated. Telemetry over the last 24 hours supports indication for pacemaker with occasional pauses and LBBB escape morphology + backup V pacing. Will plan for single chamber PPM programmed VVI. Timing of implant to be determined by lab availability. Continue to hold Eliquis . Okay to bridge with heparin .  Continue Diltiazem  180mg   Explained risks, benefits, and alternatives to PPM implantation, including but not limited to bleeding, infection, pneumothorax, pericardial effusion, lead dislodgement, heart attack, stroke, or death.  Pt verbalized understanding and agrees to proceed.    OAC held    Severe AS s/p TAVR Successful TAVR on 10/28 with 26mm Sapien 3.    Hypertension BP elevated. Now on home Diltiazem  and Losartan.   Hyperlipidemia Continue Crestor  5mg  daily.      For questions or updates, please contact [facility] Please consult [email or web address] for contact info under     Signed, Artist Pouch, PA-C  12/28/2023, 7:18 AM     I have seen, examined the patient, and reviewed the above assessment and plan.  Interval:  No acute overnight events. Patient reports feeling relatively well. No new or acute complaints.   General: Well developed, in no acute distress.  Neck: No JVD.  Cardiac: Normal rate, irregular rhythm.  Resp: Normal work of breathing.  Ext: No edema.  Neuro: No gross focal deficits.  Psych: Normal affect.   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 is an 89 year old male with baseline permanent atrial fibrillation right bundle branch block who underwent TAVR and had complete heart block immediately following valve deployment.  Patient has been in atrial fibrillation conducting for the majority of the time, but close review of his telemetry shows intermittent left bundle branch block and paced beat on a few occasions.  Given that he was high risk at baseline with preceding right bundle branch block, now having some alternating bundle branch block and prolonged RR intervals with pacing, the safest option is to implant pacemaker prior to hospital discharge.  Given that he requires rate control for his atrial fibrillation, he meets criteria for pacemaker implant in the setting of tachycardia-bradycardia syndrome.  Once pacemaker is implanted then we can be more aggressive with rate controlling agents.    -We will pause his heparin  and plan for pacemaker implant later today. Explained risks, benefits, and alternatives to pacemaker implantation, including but not limited to bleeding, infection, damage to heart or lungs, heart attack, stroke, or death.  Pt verbalized understanding and agrees to proceed.   Fonda Kitty, MD 12/28/2023 5:07 PM

## 2023-12-28 NOTE — H&P (View-Only) (Signed)
 Patient Name: Chad Morrison Date of Encounter: 12/28/2023  Primary Cardiologist: Arun K Thukkani, MD Electrophysiologist: None  Interval Summary   The patient is doing well today. Is a bit frustrated about having to stay another night. At this time, the patient denies chest pain, shortness of breath, or any new concerns.  Vital Signs    Vitals:   12/28/23 0630 12/28/23 0645 12/28/23 0700 12/28/23 0702  BP: (!) 153/86 (!) 161/90 (!) 168/100 (!) 172/152  Pulse: 84 88 82 92  Resp: (!) 4 (!) 8 (!) 23 (!) 26  Temp:      TempSrc:      SpO2: 96% 96% 96% 97%  Weight:      Height:        Intake/Output Summary (Last 24 hours) at 12/28/2023 0718 Last data filed at 12/28/2023 9347 Gross per 24 hour  Intake 398.23 ml  Output 2025 ml  Net -1626.77 ml   Filed Weights   12/26/23 0728 12/27/23 0500 12/28/23 0500  Weight: 63.5 kg 62 kg 58.8 kg    Physical Exam    GEN- NAD, Alert and oriented  Lungs- Clear to ausculation bilaterally, normal work of breathing Cardiac- Irregularly irregular rate and rhythm, no murmurs, rubs or gallops GI- soft, NT, ND, + BS Extremities- no clubbing or cyanosis. No edema  Telemetry    Persistent afib with RBBB morphology, intermittent LBBB escape beats vs backup V pacing. Ventricular rates primarily 70s-80s overnight (personally reviewed)  Hospital Course    [name] is a 89 y.o. male with a history of HTN, HDL, permanent atrial fibrillation on Eliquis , Wegener's granulomatosis (on immunotherapy and low dose prednisone ), CKD stage II, limited mobility d/t bilateral hammertoes, aortic atherosclerosis and critical AS who is being seen today for the evaluation of transient complete heart block with baseline RBBB following TAVR at the request of Dr. Wendel. He was admitted following successful TAVR with 26mm Sapien on 10/28. Following the procedure, patient with underlying RBBB developed CHB. He was initially pacer dependant but had spontaneous recovery  of intrinsic rhythm with afib.   Assessment & Plan    Baseline RBBB Complete heart block s/p TAVR Permanent atrial fibrillation Transient complete heart block following successful TAVR on 12/26/23 with Dr. Wendel. Telemetry on the afternoon of 10/29 and overnight with infrequent pauses followed by LBBB escape morphology vs V paced backup from temp wire. Given RBBB at baseline along with temporary CHB postprocedure, risk of recurrent HB is elevated. Telemetry over the last 24 hours supports indication for pacemaker with occasional pauses and LBBB escape morphology + backup V pacing. Will plan for single chamber PPM programmed VVI. Timing of implant to be determined by lab availability. Continue to hold Eliquis . Okay to bridge with heparin .  Continue Diltiazem  180mg   Explained risks, benefits, and alternatives to PPM implantation, including but not limited to bleeding, infection, pneumothorax, pericardial effusion, lead dislodgement, heart attack, stroke, or death.  Pt verbalized understanding and agrees to proceed.    OAC held    Severe AS s/p TAVR Successful TAVR on 10/28 with 26mm Sapien 3.    Hypertension BP elevated. Now on home Diltiazem  and Losartan.   Hyperlipidemia Continue Crestor  5mg  daily.      For questions or updates, please contact [facility] Please consult [email or web address] for contact info under     Signed, Artist Pouch, PA-C  12/28/2023, 7:18 AM     I have seen, examined the patient, and reviewed the above assessment and plan.  Interval:  No acute overnight events. Patient reports feeling relatively well. No new or acute complaints.   General: Well developed, in no acute distress.  Neck: No JVD.  Cardiac: Normal rate, irregular rhythm.  Resp: Normal work of breathing.  Ext: No edema.  Neuro: No gross focal deficits.  Psych: Normal affect.   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 is an 89 year old male with baseline permanent atrial fibrillation right bundle branch block who underwent TAVR and had complete heart block immediately following valve deployment.  Patient has been in atrial fibrillation conducting for the majority of the time, but close review of his telemetry shows intermittent left bundle branch block and paced beat on a few occasions.  Given that he was high risk at baseline with preceding right bundle branch block, now having some alternating bundle branch block and prolonged RR intervals with pacing, the safest option is to implant pacemaker prior to hospital discharge.  Given that he requires rate control for his atrial fibrillation, he meets criteria for pacemaker implant in the setting of tachycardia-bradycardia syndrome.  Once pacemaker is implanted then we can be more aggressive with rate controlling agents.    -We will pause his heparin  and plan for pacemaker implant later today. Explained risks, benefits, and alternatives to pacemaker implantation, including but not limited to bleeding, infection, damage to heart or lungs, heart attack, stroke, or death.  Pt verbalized understanding and agrees to proceed.   Fonda Kitty, MD 12/28/2023 5:07 PM

## 2023-12-28 NOTE — Progress Notes (Addendum)
 [facility]   MULTIDISCIPLINARY HEART VALVE TEAM  Patient Name: Chad Morrison Date of Encounter: 12/28/2023  Admit date: 12/26/2023   PCP:  Morrison, Chad W, MD  [facility] Cardiologist:  Chad MARLA Red, MD  [facility] Structural heart: Chad MARLA Red, MD [facility] Electrophysiologist:  None   Hospital Problem List     Principal Problem:   S/P TAVR (transcatheter aortic valve replacement) Active Problems:   COPD (chronic obstructive pulmonary disease) (HCC)   Permanent atrial fibrillation (HCC)   Limited granulomatosis with polyangiitis (HCC)   Severe persistent asthma (HCC)   Severe aortic stenosis   Essential hypertension   Hyperlipidemia   RBBB   CHB (complete heart block) (HCC)     Subjective   Agitated today. Wants to go home today no matter what.   Inpatient Medications    Scheduled Meds:  azelastine   2 spray Each Nare BID   brimonidine   1 drop Right Eye BID   Chlorhexidine Gluconate Cloth  6 each Topical Daily   diltiazem   180 mg Oral Daily   famotidine  20 mg Oral QHS   levothyroxine   88 mcg Oral QAC breakfast   loratadine   10 mg Oral QPM   losartan  25 mg Oral Daily   montelukast   10 mg Oral QHS   pantoprazole   40 mg Oral q morning   rosuvastatin   5 mg Oral Daily   sodium chloride  flush  3 mL Intravenous Q12H   Continuous Infusions:  sodium chloride      heparin  1,000 Units/hr (12/28/23 0807)   nitroGLYCERIN 25 mcg/min (12/28/23 0807)   PRN Meds: sodium chloride , acetaminophen  **OR** acetaminophen , ALPRAZolam , morphine injection, ondansetron  (ZOFRAN ) IV, mouth rinse, oxyCODONE, sodium chloride  flush, traMADol   Vital Signs    Vitals:   12/28/23 0700 12/28/23 0702 12/28/23 0800 12/28/23 0801  BP: (!) 168/100 (!) 172/152 (!) 160/101 (!) 160/101  Pulse: 82 92 91 88  Resp: (!) 23 (!) 26 15 14   Temp:      TempSrc:      SpO2: 96% 97% 96% 95%  Weight:      Height:        Intake/Output Summary (Last 24 hours) at  12/28/2023 0829 Last data filed at 12/28/2023 0807 Gross per 24 hour  Intake 378.13 ml  Output 2025 ml  Net -1646.87 ml   Filed Weights   12/26/23 0728 12/27/23 0500 12/28/23 0500  Weight: 63.5 kg 62 kg 58.8 kg    Physical Exam    GEN: Well nourished, well developed, in no acute distress.  HEENT: Grossly normal.  Neck: Supple, no JVD or masses. RIJ temp wire in place. Cardiac: irreg irreg,  no murmurs, rubs, or gallops. No clubbing, cyanosis, edema.   Respiratory:  Respirations regular and unlabored, clear to auscultation bilaterally. GI: Soft, nontender, nondistended, BS + x 4. MS: no deformity or atrophy. Skin: warm and dry, no rash. Neuro:  Strength and sensation are intact. Psych: AAOx3.  Normal affect.  Labs    CBC Recent Labs    12/27/23 0206 12/28/23 0410  WBC 6.0 5.0  HGB 11.0* 10.4*  HCT 33.9* 30.7*  MCV 87.4 84.8  PLT 124* 94*   Basic Metabolic Panel: Recent Labs  Lab 12/22/23 1030 12/26/23 1114 12/27/23 0206 12/28/23 0410  NA 133* 140 135 131*  K 3.6 3.7 4.4 4.0  CL 101 105 102 96*  CO2 24  --  24 22  GLUCOSE 104* 101* 88 93  BUN 20  16 16 11   CREATININE 1.15 1.00 1.04 0.85  CALCIUM  9.0  --  9.0 8.6*  MG  --   --  1.9 2.0   GFR: Estimated Creatinine Clearance: 49 mL/min (by C-G formula based on SCr of 0.85 mg/dL). Recent Labs  Lab 12/22/23 1030 12/27/23 0206 12/28/23 0410  WBC 4.5 6.0 5.0    Telemetry   Afib with RBBB, HRs ranging from 80-110s - Personally Reviewed  ECG    ECG from yesterday showed Atrial fibrillation, LAD, and RBBB, HR 85. - Personally Reviewed  Patient Profile     Chad Morrison is a 89 y.o. 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    Assessment & Plan    Severe  AS:  -- S/p successful TAVR with a 26 mm Edwards Sapien 3 Ultra Resilia THV via the TF approach on 12/26/23.  -- Post operative echo showed EF 50-55%, mod LVH,  severe biatrial enlargement, mild MR and normally functioning TAVR with a mean gradient of 6.4 mmHg and trivial PVL. -- Groin sites are stable.  -- Holding home Eliquis  for PPM.    Transient CHB with tachybrady syndrome: -- Pt with underlying RBBB and developed CHB after TAVR.  -- RIJ pacer left in place and transferred to unit.  -- Developed elevated HRs in afib and put back on home Cardizem  CD 180 with evidence of transient pacing needs.  -- EP has consulted and plans for PPM tomorrow am @ 7:30am. Pt wants to go home same day.    Permanent atrial fibrillation:  -- HRs well controlled on home Cardizem  CD 180 mg daily. -- Intermittently pacing, will need PPM. -- Continue IV heparin  until after PPM placement.  -- On Eliquis  5mg  BID at home.     Hypertension:  -- BP elevated and still on IV nitroglycerin. -- Continue Cardizem  CD 180 mg daily, and increase Losartan 25mg  daily to BID.  -- Wean off IV nitro   Hyperlipidemia:  -- Continue Crestor  5 mg daily.   -- Lipids followed by PCP.  SignedLamarr Hummer, PA-C  12/28/2023, 8:29 AM  Pager [phone]  ATTENDING ATTESTATION:  After conducting a review of all available clinical information with the care team, interviewing the patient, and performing a physical exam, I agree with the findings and plan described in this note.   GEN: No acute distress, AO x 3 HEENT:  MMM, no JVD, no scleral icterus; right IJ temporary pacemaker in place Cardiac: RRR, no murmurs, rubs, or gallops.  Respiratory: Clear to auscultation bilaterally. GI: Soft, nontender, non-distended  MS: No edema; No deformity. Neuro:  Nonfocal  Vasc:  +2 radial pulses; groin access sites intact  Seen by EP today and planning permanent pacemaker implantation.  Patient's blood pressure remains above goal.   Will intensify losartan to 50 mg.  Stop nitro drip.  Will resume Eliquis  after permanent pacemaker has been placed as informed by EP recommendations.  Chad Red, MD Pager [phone]

## 2023-12-28 NOTE — Telephone Encounter (Signed)
 Spoke with pt wife, questions regarding PPM insertion answered per the notes in the patient chart. She is concerned because his platelets are low and she is concerned about that. She would like kathryn to call her if possible.

## 2023-12-29 ENCOUNTER — Encounter (HOSPITAL_COMMUNITY): Payer: Self-pay | Admitting: Cardiology

## 2023-12-29 ENCOUNTER — Inpatient Hospital Stay (HOSPITAL_COMMUNITY)

## 2023-12-29 DIAGNOSIS — Z952 Presence of prosthetic heart valve: Secondary | ICD-10-CM | POA: Diagnosis not present

## 2023-12-29 DIAGNOSIS — I350 Nonrheumatic aortic (valve) stenosis: Secondary | ICD-10-CM | POA: Diagnosis not present

## 2023-12-29 LAB — BASIC METABOLIC PANEL WITH GFR
Anion gap: 12 (ref 5–15)
BUN: 12 mg/dL (ref 8–23)
CO2: 21 mmol/L — ABNORMAL LOW (ref 22–32)
Calcium: 8.6 mg/dL — ABNORMAL LOW (ref 8.9–10.3)
Chloride: 92 mmol/L — ABNORMAL LOW (ref 98–111)
Creatinine, Ser: 0.95 mg/dL (ref 0.61–1.24)
GFR, Estimated: >60 mL/min (ref >60)
Glucose, Bld: 97 mg/dL (ref 70–99)
Potassium: 4.1 mmol/L (ref 3.5–5.1)
Sodium: 125 mmol/L — ABNORMAL LOW (ref 135–145)

## 2023-12-29 LAB — CBC
HCT: 30.2 % — ABNORMAL LOW (ref 39.0–52.0)
Hemoglobin: 10.3 g/dL — ABNORMAL LOW (ref 13.0–17.0)
MCH: 29.2 pg (ref 26.0–34.0)
MCHC: 34.1 g/dL (ref 30.0–36.0)
MCV: 85.6 fL (ref 80.0–100.0)
Platelets: 85 K/uL — ABNORMAL LOW (ref 150–400)
RBC: 3.53 MIL/uL — ABNORMAL LOW (ref 4.22–5.81)
RDW: 13 % (ref 11.5–15.5)
WBC: 4.7 K/uL (ref 4.0–10.5)
nRBC: 0 % (ref 0.0–0.2)

## 2023-12-29 LAB — SURGICAL PCR SCREEN
MRSA, PCR: NEGATIVE
Staphylococcus aureus: NEGATIVE

## 2023-12-29 LAB — HEPARIN LEVEL (UNFRACTIONATED): Heparin Unfractionated: <0.1 [IU]/mL — ABNORMAL LOW (ref 0.30–0.70)

## 2023-12-29 MED ORDER — LOSARTAN POTASSIUM 25 MG PO TABS: 25.0000 mg | ORAL_TABLET | Freq: Two times a day (BID) | ORAL | 3 refills | Status: DC

## 2023-12-29 MED ORDER — AMLODIPINE BESYLATE 10 MG PO TABS
10.0000 mg | ORAL_TABLET | Freq: Every day | ORAL | Status: DC
  Administered 2023-12-29: 10 mg via ORAL
  Filled 2023-12-29: qty 1

## 2023-12-29 MED FILL — Midazolam HCl Inj 2 MG/2ML (Base Equivalent): INTRAMUSCULAR | Qty: 1 | Status: AC

## 2023-12-29 NOTE — TOC Initial Note (Signed)
 Transition of Care [facility]) - Initial/Assessment Note    Patient Details  Name: Chad Morrison MRN: [id number] Date of Birth: [birth year]  Transition of Care [facility] CM/SW Contact:    Sudie Erminio Deems, RN Phone Number: 12/29/2023, 11:46 AM  Clinical Narrative: Patient s/p TAVR. PTA patient was from home with spouse. Plan for home with home health services-Medicare.gov list provided and spouse chose [facility]. Referral submitted via the hub and the office will schedule within 24-48 hours post discharge. DME rolling walker ordered via [employer] and delivered to the room. Spouse will transport home. No further needs identified at this time.                 Expected Discharge Plan: Home w Home Health Services Barriers to Discharge: No Barriers Identified   Patient Goals and CMS Choice Patient states their goals for this hospitalization and ongoing recovery are:: Plan to transport home CMS Medicare.gov Compare Post Acute Care list provided to:: Patient Represenative (must comment) (Spouse) Choice offered to / list presented to : Spouse      Expected Discharge Plan and Services In-house Referral: NA Discharge Planning Services: CM Consult Post Acute Care Choice: Home Health Living arrangements for the past 2 months: Single Family Home Expected Discharge Date: 12/29/23               DME Arranged: Vannie rolling DME Agency: [employer] Date DME Agency Contacted: 12/29/23 Time DME Agency Contacted: 1100 Representative spoke with at DME Agency: [place] HH Arranged: PT HH Agency: [facility] Date [facility] Agency Contacted: 12/29/23 Time HH Agency Contacted: 1120 Representative spoke with at [facility] Agency: [place]  Prior Living Arrangements/Services Living arrangements for the past 2 months: Single Family Home Lives with:: Spouse Patient language and need for interpreter reviewed:: Yes Do you feel safe going back to the place where you live?: Yes      Need for Family  Participation in Patient Care: Yes (Comment) Care giver support system in place?: Yes (comment)   Criminal Activity/Legal Involvement Pertinent to Current Situation/Hospitalization: No - Comment as needed  Activities of Daily Living   ADL Screening (condition at time of admission) Independently performs ADLs?: Yes (appropriate for developmental age) Is the patient deaf or have difficulty hearing?: No Does the patient have difficulty seeing, even when wearing glasses/contacts?: No Does the patient have difficulty concentrating, remembering, or making decisions?: No  Permission Sought/Granted Permission sought to share information with : Case Manager, Family Supports, Oceanographer granted to share information with : Yes, Verbal Permission Granted     Permission granted to share info w AGENCY: [facility], [employer]        Emotional Assessment Appearance:: Appears stated age Attitude/Demeanor/Rapport: Engaged Affect (typically observed): Appropriate Orientation: : Oriented to Self, Oriented to Place, Oriented to  Time, Oriented to Situation Alcohol  / Substance Use: Not Applicable Psych Involvement: No (comment)  Admission diagnosis:  Aortic stenosis, severe [I35.0] S/P TAVR (transcatheter aortic valve replacement) [Z95.2] Patient Active Problem List   Diagnosis Date Noted   RBBB 12/26/2023   S/P TAVR (transcatheter aortic valve replacement) 12/26/2023   CHB (complete heart block) (HCC) 12/26/2023   Perennial allergic rhinitis 06/12/2023   Nasal septal perforation 06/12/2023   LPRD (laryngopharyngeal reflux disease) 06/12/2023   Monoclonal gammopathy 06/12/2023   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   Severe persistent asthma (HCC) 05/31/2022   Severe aortic stenosis 05/31/2022   Essential hypertension 05/31/2022   Hyperlipidemia 05/31/2022   Hyponatremia 05/30/2022   Contact dermatitis due  to drugs in contact with skin 11/08/2021   Meibomian gland  dysfunction (MGD) of both eyes 11/08/2021   Blepharitis of right eye with rosacea 11/08/2021   Secondary glaucoma due to combination mechanisms, right, severe stage 09/30/2021   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   Corneal edema, idiopathic, right 09/30/2021   Chronically dry eyes, left 08/10/2021   Pseudophakia, left eye 06/03/2021   Intermediate stage nonexudative age-related macular degeneration of both eyes 01/21/2020   Vitreous prolapse of right eye 07/09/2019   Cystoid macular edema of right eye 07/09/2019   Right epiretinal membrane 06/04/2019   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) 06/04/2019   Limited granulomatosis with polyangiitis (HCC) 06/04/2019   Pulmonary nodules 04/09/2019   Asthma 10/22/2018   Epistaxis 01/18/2018   Permanent atrial fibrillation (HCC) 11/08/2017   COPD (chronic obstructive pulmonary disease) (HCC) 08/08/2017   Chronic rhinitis 08/08/2017   PCP:  Vernadine Charlie ORN, MD Pharmacy:   [facility] - [place], [place] - [address] [facility] AT Portal to Registered [employer] Sites [address] [place] [address] Phone: [phone] Fax: [phone]  [id number] [place], [place] - [address] AT [facility] [address] & [facility] [place] [address] Phone: [phone] Fax: [phone]  Social Drivers of Health (SDOH) Social History: SDOH Screenings   Food Insecurity: No Food Insecurity (12/26/2023)  Housing: Unknown (12/26/2023)  Transportation Needs: No Transportation Needs (12/26/2023)  Utilities: Not At Risk (12/26/2023)  Social Connections: Unknown (12/26/2023)  Tobacco Use: Medium Risk (12/26/2023)   SDOH Interventions: Food Insecurity Interventions: Intervention Not Indicated Housing Interventions: Intervention Not Indicated Transportation Interventions: Intervention  Not Indicated Utilities Interventions: Intervention Not Indicated Social Connections Interventions: Intervention Not Indicated  Readmission Risk Interventions     No data to display

## 2023-12-29 NOTE — Progress Notes (Signed)
  Patient Name: Chad Morrison Date of Encounter: 12/29/2023  Primary Cardiologist: Lurena MARLA Red, MD Electrophysiologist: Dr. Kennyth  Interval Summary   A little sore but otherwise OK.   Vital Signs    Vitals:   12/29/23 0530 12/29/23 0600 12/29/23 0630 12/29/23 0700  BP: 133/62 121/74 126/79 (!) 141/86  Pulse: 78 74 82 75  Resp: 16 (!) 24 14 14   Temp:      TempSrc:      SpO2: 94% 94% 95% 94%  Weight:      Height:        Intake/Output Summary (Last 24 hours) at 12/29/2023 0707 Last data filed at 12/29/2023 0600 Gross per 24 hour  Intake 908.72 ml  Output 1650 ml  Net -741.28 ml   Filed Weights   12/27/23 0500 12/28/23 0500 12/29/23 0409  Weight: 62 kg 58.8 kg 60.9 kg    Physical Exam    GEN- NAD, Alert and oriented  Lungs- Clear to ausculation bilaterally, normal work of breathing Cardiac- Irregularly irregular rate and rhythm, no murmurs, rubs or gallops GI- soft, NT, ND, + BS Extremities- no clubbing or cyanosis. No edema  Telemetry    AF in 70-80s (personally reviewed)  Hospital Course     Chad Morrison is a 89 y.o. male with a history of HTN, HDL, permanent atrial fibrillation on Eliquis , Wegener's granulomatosis (on immunotherapy and low dose prednisone ), CKD stage II, limited mobility d/t bilateral hammertoes, aortic atherosclerosis and critical AS who is being seen today for the evaluation of transient complete heart block with baseline RBBB following TAVR at the request of Dr. Red. He was admitted following successful TAVR with 26mm Sapien on 10/28. Following the procedure, patient with underlying RBBB developed CHB. He was initially pacer dependant but had spontaneous recovery of intrinsic rhythm with afib.   Assessment & Plan    Baseline RBBB Complete heart block s/p TAVR Permanent atrial fibrillation S/p [employer] single chamber PPM CXR stable Interrogation stable. Wound care and restrictions with patient  Usual follow up in place    Resume Eliquis  Tomorrow, 11/1 with the evening dose.    For questions or updates, please contact [facility] Please consult [email or web address] for contact info under     Signed, Ozell Prentice Passey, PA-C  12/29/2023, 7:07 AM

## 2023-12-29 NOTE — Plan of Care (Signed)

## 2023-12-29 NOTE — Evaluation (Signed)
 Physical Therapy Evaluation Patient Details Name: Chad Morrison MRN: [id number] DOB: Jul 03, 1932 Today's Date: 12/29/2023  History of Present Illness  89 y.o. male admitted  10/28 Severe Aortic Stenosis, sinus arrest/complete heart block. S/p Transcatheter Aortic Valve Replacement - Transfemoral Approach. S/p [employer] single chamber PPM. Past medical history of HTN, HDL, permanent atrial fibrillation on Eliquis , Wegener's granulomatosis (on immunotherapy and low dose prednisone ), CKD stage II, limited mobility d/t bilateral hammertoes, aortic atherosclerosis and critical AS.Chad Morrison   Clinical Impression  Patient is s/p above surgery presenting with functional limitations due to the deficits listed below (see PT Problem List). Previously independent and active until recently. Was walking for exercise at a local gym. Currently requires up to min assist for bed mobility and transfers. +Dizziness upon sitting up, and instability initially upon standing. States he hasn't been OOB for 3 days. This gradually resolved. Ambulates with CGA using RW for support. Extensive education to pt and wife regarding symptom awareness and safety with transitions due to dizziness. To use RW at all times, home set-up, and modifications to minimize fall risk. Will have approx 5 steps to navigate into home, will need wife to assist. Patient will benefit from acute skilled PT to increase their independence and safety with mobility to facilitate discharge.     BP 135/83 in bed pre-activity, 155/97 in chair post-activity. HR and Spo2 stable throughout on RA.     If plan is discharge home, recommend the following: A little help with walking and/or transfers;A little help with bathing/dressing/bathroom;Assistance with cooking/housework;Help with stairs or ramp for entrance;Supervision due to cognitive status;Assist for transportation   Can travel by private vehicle        Equipment Recommendations Rolling walker (2 wheels)   Recommendations for Other Services       Functional Status Assessment Patient has had a recent decline in their functional status and demonstrates the ability to make significant improvements in function in a reasonable and predictable amount of time.     Precautions / Restrictions Precautions Precautions: ICD/Pacemaker;Fall Recall of Precautions/Restrictions: Impaired Precaution/Restrictions Comments: Handout provided, needs reinforced Required Braces or Orthoses: Sling (24 hr post-op.) Restrictions Other Position/Activity Restrictions: Visual merchandiser precautions Lt      Mobility  Bed Mobility Overal bed mobility: Needs Assistance Bed Mobility: Supine to Sit     Supine to sit: Contact guard, HOB elevated     General bed mobility comments: CGA for safety to rise while maintaining precautions, which need reinforced not to push/pull with LUE. Dizziness upon sitting up, took a few minutes to resolve.    Transfers Overall transfer level: Needs assistance Equipment used: Rolling walker (2 wheels), 1 person hand held assist Transfers: Sit to/from Stand Sit to Stand: Min assist           General transfer comment: Min assist for boost to stand initially with HHA then with RW. More steady with RW upon standing. Cues for safety and techniques to maintain balance and precautions.    Ambulation/Gait Ambulation/Gait assistance: Contact guard assist Gait Distance (Feet): 300 Feet Assistive device: Rolling walker (2 wheels) Gait Pattern/deviations: Step-through pattern, Decreased stride length, Trunk flexed, Drifts right/left, Wide base of support Gait velocity: dec Gait velocity interpretation: <1.8 ft/sec, indicate of risk for recurrent falls Pre-gait activities: static march General Gait Details: Initially showing some posterior instability. Cues for RW placement and techniques to steady himself with proper placement to maximize support from device. Symmetry improved with distance.  Light UE support on RW without overt LOB or  buckling.  SpO2 98% on RA, HR 90s. CGA for safety.  Stairs            Wheelchair Mobility     Tilt Bed    Modified Rankin (Stroke Patients Only)       Balance Overall balance assessment: Needs assistance Sitting-balance support: No upper extremity supported, Feet supported Sitting balance-Leahy Scale: Good     Standing balance support: Single extremity supported Standing balance-Leahy Scale: Poor                               Pertinent Vitals/Pain Pain Assessment Pain Assessment: 0-10 Pain Score: 2  Pain Location: Surgical site of PPM Pain Descriptors / Indicators: Sore Pain Intervention(s): Limited activity within patient's tolerance, Monitored during session, Repositioned    Home Living Family/patient expects to be discharged to:: Private residence Living Arrangements: Spouse/significant other Available Help at Discharge: Family;Available 24 hours/day Type of Home: House Home Access: Stairs to enter Entrance Stairs-Rails: Doctor, General Practice of Steps: 5   Home Layout: One level Home Equipment: Shower seat - built in      Prior Function Prior Level of Function : Independent/Modified Independent;Driving             Mobility Comments: Previously independent and active until onset of DOE. No device, denies falls. Walks at gym for exercise. ADLs Comments: ind     Extremity/Trunk Assessment   Upper Extremity Assessment Upper Extremity Assessment: Defer to OT evaluation    Lower Extremity Assessment Lower Extremity Assessment: Generalized weakness       Communication   Communication Communication: No apparent difficulties    Cognition Arousal: Alert Behavior During Therapy: WFL for tasks assessed/performed   PT - Cognitive impairments: Memory                       PT - Cognition Comments: Precautions need reinforced Following commands: Intact       Cueing  Cueing Techniques: Verbal cues, Gestural cues     General Comments General comments (skin integrity, edema, etc.): Wife present and supportive. Educated both on precautions and safety. Handout provided. BP 135/83 in bed pre-activity, 155/97 in chair post-activity. HR and Spo2 stable throughout on RA.    Exercises     Assessment/Plan    PT Assessment Patient needs continued PT services  PT Problem List Decreased strength;Decreased range of motion;Decreased activity tolerance;Decreased balance;Decreased mobility;Decreased knowledge of use of DME;Decreased knowledge of precautions;Pain       PT Treatment Interventions DME instruction;Gait training;Stair training;Functional mobility training;Therapeutic activities;Therapeutic exercise;Balance training;Neuromuscular re-education;Patient/family education;Modalities    PT Goals (Current goals can be found in the Care Plan section)  Acute Rehab PT Goals Patient Stated Goal: Go home PT Goal Formulation: With patient/family Time For Goal Achievement: 01/05/24 Potential to Achieve Goals: Good    Frequency Min 2X/week     Co-evaluation               AM-PAC PT 6 Clicks Mobility  Outcome Measure Help needed turning from your back to your side while in a flat bed without using bedrails?: A Little Help needed moving from lying on your back to sitting on the side of a flat bed without using bedrails?: A Little Help needed moving to and from a bed to a chair (including a wheelchair)?: A Little Help needed standing up from a chair using your arms (e.g., wheelchair or bedside chair)?: A Little Help  needed to walk in hospital room?: A Little Help needed climbing 3-5 steps with a railing? : A Little 6 Click Score: 18    End of Session Equipment Utilized During Treatment: Gait belt Activity Tolerance: Patient tolerated treatment well Patient left: in chair;with call bell/phone within reach;with chair alarm set;with family/visitor  present;with nursing/sitter in room Nurse Communication: Mobility status PT Visit Diagnosis: Unsteadiness on feet (R26.81);Other abnormalities of gait and mobility (R26.89);Muscle weakness (generalized) (M62.81);Difficulty in walking, not elsewhere classified (R26.2);Pain Pain - Right/Left: Left Pain - part of body: Shoulder    Time: 9148-9083 PT Time Calculation (min) (ACUTE ONLY): 25 min   Charges:   PT Evaluation $PT Eval Low Complexity: 1 Low PT Treatments $Gait Training: 8-22 mins PT General Charges $$ ACUTE PT VISIT: 1 Visit         Leontine Roads, PT, DPT [facility]  [facility] Services Physical Therapist Office: [phone] Website: [email or web address]   Leontine GORMAN Roads 12/29/2023, 11:17 AM

## 2023-12-30 ENCOUNTER — Other Ambulatory Visit: Payer: Self-pay

## 2023-12-30 ENCOUNTER — Encounter (HOSPITAL_BASED_OUTPATIENT_CLINIC_OR_DEPARTMENT_OTHER): Payer: Self-pay

## 2023-12-30 ENCOUNTER — Telehealth: Payer: Self-pay | Admitting: Student

## 2023-12-30 ENCOUNTER — Emergency Department (HOSPITAL_BASED_OUTPATIENT_CLINIC_OR_DEPARTMENT_OTHER)
Admission: EM | Admit: 2023-12-30 | Discharge: 2023-12-30 | Disposition: A | Discharge status: Home / Self Care | Attending: Emergency Medicine | Admitting: Emergency Medicine

## 2023-12-30 ENCOUNTER — Emergency Department (HOSPITAL_BASED_OUTPATIENT_CLINIC_OR_DEPARTMENT_OTHER): Admitting: Radiology

## 2023-12-30 DIAGNOSIS — Z7901 Long term (current) use of anticoagulants: Secondary | ICD-10-CM | POA: Insufficient documentation

## 2023-12-30 DIAGNOSIS — I480 Paroxysmal atrial fibrillation: Secondary | ICD-10-CM | POA: Insufficient documentation

## 2023-12-30 DIAGNOSIS — E871 Hypo-osmolality and hyponatremia: Secondary | ICD-10-CM | POA: Diagnosis not present

## 2023-12-30 DIAGNOSIS — Z79899 Other long term (current) drug therapy: Secondary | ICD-10-CM | POA: Diagnosis not present

## 2023-12-30 DIAGNOSIS — Z91040 Latex allergy status: Secondary | ICD-10-CM | POA: Diagnosis not present

## 2023-12-30 DIAGNOSIS — Z950 Presence of cardiac pacemaker: Secondary | ICD-10-CM | POA: Insufficient documentation

## 2023-12-30 DIAGNOSIS — I10 Essential (primary) hypertension: Secondary | ICD-10-CM | POA: Insufficient documentation

## 2023-12-30 DIAGNOSIS — R531 Weakness: Secondary | ICD-10-CM | POA: Diagnosis not present

## 2023-12-30 DIAGNOSIS — E039 Hypothyroidism, unspecified: Secondary | ICD-10-CM | POA: Insufficient documentation

## 2023-12-30 DIAGNOSIS — Z8546 Personal history of malignant neoplasm of prostate: Secondary | ICD-10-CM | POA: Diagnosis not present

## 2023-12-30 DIAGNOSIS — J45909 Unspecified asthma, uncomplicated: Secondary | ICD-10-CM | POA: Diagnosis not present

## 2023-12-30 DIAGNOSIS — R410 Disorientation, unspecified: Secondary | ICD-10-CM | POA: Diagnosis present

## 2023-12-30 DIAGNOSIS — Z48812 Encounter for surgical aftercare following surgery on the circulatory system: Secondary | ICD-10-CM | POA: Diagnosis not present

## 2023-12-30 LAB — CBC
HCT: 32.3 % — ABNORMAL LOW (ref 39.0–52.0)
Hemoglobin: 11.2 g/dL — ABNORMAL LOW (ref 13.0–17.0)
MCH: 29.2 pg (ref 26.0–34.0)
MCHC: 34.7 g/dL (ref 30.0–36.0)
MCV: 84.1 fL (ref 80.0–100.0)
Platelets: 86 K/uL — ABNORMAL LOW (ref 150–400)
RBC: 3.84 MIL/uL — ABNORMAL LOW (ref 4.22–5.81)
RDW: 13 % (ref 11.5–15.5)
WBC: 5.2 K/uL (ref 4.0–10.5)
nRBC: 0 % (ref 0.0–0.2)

## 2023-12-30 LAB — BASIC METABOLIC PANEL WITH GFR
Anion gap: 11 (ref 5–15)
BUN: 17 mg/dL (ref 8–23)
CO2: 24 mmol/L (ref 22–32)
Calcium: 9.7 mg/dL (ref 8.9–10.3)
Chloride: 93 mmol/L — ABNORMAL LOW (ref 98–111)
Creatinine, Ser: 1.03 mg/dL (ref 0.61–1.24)
GFR, Estimated: >60 mL/min (ref >60)
Glucose, Bld: 126 mg/dL — ABNORMAL HIGH (ref 70–99)
Potassium: 4.2 mmol/L (ref 3.5–5.1)
Sodium: 127 mmol/L — ABNORMAL LOW (ref 135–145)

## 2023-12-30 MED ORDER — SODIUM CHLORIDE 0.9 % IV BOLUS
500.0000 mL | Freq: Once | INTRAVENOUS | Status: AC
  Administered 2023-12-30: 500 mL via INTRAVENOUS

## 2023-12-30 NOTE — ED Provider Notes (Signed)
 [place] EMERGENCY DEPARTMENT AT [facility] Provider Note   CSN: [id number] Arrival date & time: 12/30/23  1157     Patient presents with: Altered Mental Status   Chad Morrison is a 89 y.o. male.    Altered Mental Status Patient discharged home from hospital yesterday.  Had a TAVR on the 28th and pacemaker placed on the 30th.  Had been doing well in the hospital but not sleeping well.  Reported slept well last night.  Family states he had been more confused.  Sodium had gone down to 120 on discharge.  Has had issues with hyponatremia in the past.  Sometimes thought to be related to hypothyroidism.  No dysuria although is incontinent of urine at baseline.  No headaches.    Past Medical History:  Diagnosis Date   Anxiety    Asthma    Asthmatic bronchitis    Atrial flutter (HCC)    chads2vasc score is 3   Ehrlichiosis 2008   GERD (gastroesophageal reflux disease)    Granulomatosis with polyangiitis (HCC)    HTN (hypertension)    Hyperlipidemia    Nuclear sclerotic cataract of left eye 06/04/2019   Cataract surgery completed March 2023, Dr. 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 Wendel and Dr. Daniel   Seasonal allergies     Prior to Admission medications   Medication Sig Start Date End Date Taking? Authorizing Provider  acetaminophen  (TYLENOL ) 500 MG tablet Take 500 mg by mouth every 6 (six) hours as needed (pain/restless leg).    [provider]  albuterol  (VENTOLIN  HFA) 108 (90 Base) MCG/ACT inhaler Inhale 2 puffs into the lungs every 4 (four) hours as needed for wheezing or shortness of breath. 10/24/23   Kozlow, Camellia PARAS, MD  ALPRAZolam  (XANAX ) 0.25 MG tablet Take 0.125 mg by mouth at bedtime as needed for sleep.    [provider]  apixaban  (ELIQUIS ) 5 MG TABS tablet  Take 1 tablet (5 mg total) by mouth 2 (two) times daily. 12/07/23   Thukkani, Arun K, MD  Ascorbic Acid (VITAMIN C PO) Take 1 tablet by mouth every morning.    [provider]  Azelastine  HCl 137 MCG/SPRAY SOLN Place 2 sprays into both nostrils 2 (two) times daily. 10/24/23   Kozlow, Camellia PARAS, MD  Benralizumab  (FASENRA  [place]) Inject into the skin every 8 (eight) weeks.    [provider]  brimonidine  (ALPHAGAN ) 0.2 % ophthalmic solution Place 1 drop into the right eye 2 (two) times daily. 08/27/23   [provider]  Calcium  Carbonate-Vitamin D (CALCIUM -VITAMIN D) 600-125 MG-UNIT TABS Take 1 tablet by mouth 2 (two) times daily.     [provider]  diltiazem  (CARDIZEM  CD) 180 MG 24 hr capsule TAKE 1 CAPSULE EVERY       MORNING AND AT BEDTIME 11/13/23   Thukkani, Arun K, MD  diphenhydrAMINE  (BENADRYL ) 25 MG tablet Take 25 mg by mouth daily as needed for allergies.    [provider]  EPINEPHrine  0.3 mg/0.3 mL IJ SOAJ injection SMARTSIG:0.3 Milligram(s) IM Once Patient taking differently: Inject 0.3 mg into the muscle as needed for anaphylaxis. SMARTSIG:0.3 Milligram(s) IM Once 12/27/22   Kozlow, Eric J, MD  famotidine (PEPCID) 20 MG tablet Take 20 mg by mouth at bedtime. 03/30/23   [provider]  Ferrous Sulfate  Dried (SLOW RELEASE IRON) 45 MG TBCR Take 45 mg by mouth daily.    [provider]  levocetirizine (XYZAL ) 5 MG tablet TAKE 1 TABLET EVERY EVENING 08/25/23   Ambs, Arlean HERO, FNP  levothyroxine  (SYNTHROID ) 88 MCG tablet Take 88 mcg by mouth daily before breakfast.    [provider]  losartan (COZAAR) 25 MG tablet Take 1 tablet (25 mg total) by mouth 2 (two) times daily. 12/29/23   Sebastian Lamarr SAUNDERS, PA-C  montelukast  (SINGULAIR ) 10 MG tablet Take 1 tablet (10 mg total) by mouth at bedtime. 11/15/23   Kozlow, Eric J, MD  Multiple Vitamin (MULTIVITAMIN WITH MINERALS) TABS tablet Take 1 tablet daily by mouth.    [provider]   Multiple Vitamins-Minerals (OCUVITE PRESERVISION PO) Take 1 tablet 2 (two) times daily by mouth.    [provider]  pantoprazole  (PROTONIX ) 40 MG tablet Take 40 mg by mouth every morning. 03/30/23   [provider]  Polyethyl Glycol-Propyl Glycol (SYSTANE OP) Place 1 drop into both eyes 3 (three) times daily as needed (dry/irritated eyes.).    [provider]  rosuvastatin  (CRESTOR ) 5 MG tablet Take 1 tablet (5 mg total) by mouth daily. 08/22/23   Thukkani, Arun K, MD  SYMBICORT  160-4.5 MCG/ACT inhaler Inhale 2 puffs into the lungs 2 (two) times daily. 10/24/23   Kozlow, Camellia PARAS, MD    Allergies: Penicillins, Latex, Sulfa antibiotics, and Sulfamethoxazole-trimethoprim    Review of Systems  Updated Vital Signs BP 135/66   Pulse 72   Temp 97.6 F (36.4 C) (Oral)   Resp 18   SpO2 99%   Physical Exam Vitals and nursing note reviewed.  HENT:     Head: Normocephalic.  Neck:     Comments: Dressing to right side of neck. Cardiovascular:     Rate and Rhythm: Normal rate.  Pulmonary:     Comments: Dressing to left upper chest wall at site of pacemaker. Abdominal:     Tenderness: There is no abdominal tenderness.  Musculoskeletal:     Right lower leg: No edema.     Left lower leg: No edema.  Skin:    General: Skin is warm.  Neurological:     Mental Status: He is alert and oriented to person, place, and time.     (all labs ordered are listed, but only abnormal results are displayed) Labs Reviewed  BASIC METABOLIC PANEL WITH GFR - Abnormal; Notable for the following components:      Result Value   Sodium 127 (*)    Chloride 93 (*)    Glucose, Bld 126 (*)    All other components within normal limits  CBC - Abnormal; Notable for the following components:   RBC 3.84 (*)    Hemoglobin 11.2 (*)    HCT 32.3 (*)    Platelets 86 (*)    All other components within normal limits    EKG: None  Radiology: DG Chest 2 View Result Date: 12/30/2023 CLINICAL  DATA:  Weakness, recent TAVR EXAM: DG CHEST 2V COMPARISON:  12/29/2023 FINDINGS: Frontal and lateral views of the chest demonstrate a stable aortic valve prosthesis. The cardiac silhouette is unremarkable. Single lead pacer is in stable position. No acute airspace disease, effusion, or pneumothorax. Stable background scarring. No acute bony abnormalities. IMPRESSION: 1. No acute intrathoracic process. Electronically Signed   By: Ozell Daring M.D.   On: 12/30/2023 12:52   DG Chest 2 View Result Date: 12/29/2023 CLINICAL  DATA:  Status post pacemaker placement. EXAM: CHEST - 2 VIEW COMPARISON:  12/22/2023 FINDINGS: New single lead pacemaker seen with lead in the right ventricle. Prior TAVR noted. Mild cardiomegaly. New left lower lobe atelectasis or consolidation is seen. Right lung is clear. No pneumothorax or pleural effusion. IMPRESSION: New left lower lobe atelectasis or consolidation. Single lead pacemaker in expected position. No pneumothorax visualized. Electronically Signed   By: Norleen DELENA Kil M.D.   On: 12/29/2023 05:16   EP PPM/ICD IMPLANT Result Date: 12/28/2023  CONCLUSIONS:  1. Successful VVI pacemaker implant with LBBAP lead.  2. No early apparent complications. Fonda Kitty, MD, [facility], [facility] Cardiac Electrophysiology     Procedures   Medications Ordered in the ED  sodium chloride  0.9 % bolus 500 mL (0 mLs Intravenous Stopped 12/30/23 1417)                                    Medical Decision Making Amount and/or Complexity of Data Reviewed Labs: ordered. Radiology: ordered.   Patient with mental status change.  History of hyponatremia.  Discharge from hospital yesterday.  Could just be due to the strain of being in the hospital, however will check sodium which has been down.  Also will get chest x-ray since repeat x-ray yesterday showed potential atelectasis versus infiltrate.  Occasional cough but no real production.  No fevers.   X-ray improved.  No further infiltrate.   Sodium is increased from 125 up to 127.  Discussed with patient family.  He would rather go home.  I think is reasonable to give a saline bolus to help raise the level gently.  Has plans to be seen in 2 days.  Will discharge home.     Final diagnoses:  Hyponatremia    ED Discharge Orders     None          Patsey Lot, MD 12/30/23 1450

## 2023-12-30 NOTE — Telephone Encounter (Signed)
    The patient's wife called the after hours line reporting she was concerned about his sodium level at discharge. Was 125 and this was documented in the notes yesterday with plans for follow-up labs on Monday. She was requesting a nurse come to the house today to check his labs today and we reviewed we do not have availability for that. She reports he was not himself early this morning but had breakfast and is feeling better now. She asked him several questions while on the phone and he is A&Ox4.   We reviewed warning signs to monitor for which would warrant Emergency Department evaluation. I encouraged her to bring him if he develops AMS. Otherwise, would keep plans for his follow-up appointment and repeat labs on Monday.    Signed, Laymon CHRISTELLA Qua, PA-C 12/30/2023, 8:49 AM Pager: [phone]

## 2023-12-30 NOTE — Discharge Instructions (Signed)
 Follow-up in 2 days for recheck as planned.  Return for worsening symptoms.

## 2023-12-30 NOTE — ED Triage Notes (Signed)
 Intermittent disorientation after returning home yesterday. He had a TAVR Tuesday (12/26/23) and pacemaker Thursday (12/28/23) evening. Released from hospital yesterday. Wife reports that his sodium level of 125 yesterday at discharge. Wife and son are with patient.

## 2024-01-01 ENCOUNTER — Telehealth (HOSPITAL_COMMUNITY): Payer: Self-pay

## 2024-01-01 ENCOUNTER — Ambulatory Visit: Attending: Physician Assistant | Admitting: Physician Assistant

## 2024-01-01 VITALS — BP 110/56 | HR 73 | Ht 69.0 in | Wt 136.4 lb

## 2024-01-01 DIAGNOSIS — E871 Hypo-osmolality and hyponatremia: Secondary | ICD-10-CM

## 2024-01-01 DIAGNOSIS — R9389 Abnormal findings on diagnostic imaging of other specified body structures: Secondary | ICD-10-CM

## 2024-01-01 DIAGNOSIS — I4821 Permanent atrial fibrillation: Secondary | ICD-10-CM | POA: Diagnosis not present

## 2024-01-01 DIAGNOSIS — E785 Hyperlipidemia, unspecified: Secondary | ICD-10-CM

## 2024-01-01 DIAGNOSIS — I10 Essential (primary) hypertension: Secondary | ICD-10-CM | POA: Diagnosis not present

## 2024-01-01 DIAGNOSIS — Z952 Presence of prosthetic heart valve: Secondary | ICD-10-CM

## 2024-01-01 DIAGNOSIS — D696 Thrombocytopenia, unspecified: Secondary | ICD-10-CM

## 2024-01-01 DIAGNOSIS — Z950 Presence of cardiac pacemaker: Secondary | ICD-10-CM

## 2024-01-01 MED ORDER — AZITHROMYCIN 500 MG PO TABS: 500.0000 mg | ORAL_TABLET | ORAL | 12 refills | Status: AC

## 2024-01-01 NOTE — Progress Notes (Signed)
 [facility] VASC[facility] CENTER   MULTIDISCIPLINARY HEART VALVE CLINIC                                     Cardiology Office Note:    Date:  01/01/2024   ID:  [name], DOB 13-Aug-1932, MRN [id number]  PCP:  Chad Charlie ORN, MD  [facility] Cardiologist:  Chad K Thukkani, MD  [facility] Structural heart: Chad MARLA Red, MD [facility] Electrophysiologist:  Fonda Kitty, MD   Referring MD: Chad Charlie ORN, MD   [facility] s/p TAVR  History of Present Illness:    Chad Morrison is a 89 y.o. male with a hx of HTN, HLD, permanent atrial fibrillation on Eliquis , Wegener's granulomatosis (on immunotherapy and low dose prednisone ), CKD stage II, limited mobility d/t bilateral hammertoes, RBBB, aortic atherosclerosis and critical AS s/p TAVR (12/26/23) who presents to clinic for follow up.   He has been followed by Dr. Red for aortic stenosis but remained asymptomatic and quite active. He was admitted in 05/2022 with LE edema and profound hyponatremic thought to be due to inadequately treated hypothyroidism. He was administered IV levothyroxine  with improvement in his edema and sodium levels. He continued to do well but recently developed progressive exertional dyspnea and fatigue. Echo 10/23/23 showed EF 50%, and critical AS with mean grad 52 mmHg, AVA 0.45 cm2, DVI 0.16, SVI 30 and mild AI, TR & MR. [facility] 11/06/23 showed mild, nonobstructive coronary artery disease. Fick cardiac output of 8.2 L/min and Fick cardiac index of 4.6 L/min/m with following hemodynamics: Right atrial pressure mean of 10 mmHg Right ventricular pressure 45/5 with an end-diastolic pressure of 9 mmHg. Wedge pressure mean of 23 mmHg the V waves to 31 mmHg PA pressure 43/19 with a mean of 29 mmHg PVR 0.7 Woods units PA pulsatility index of 2.4. S/p successful TAVR with a 26 mm Edwards Sapien 3 Ultra Resilia THV via the TF approach on 12/26/23. Post operative echo showed EF 50-55%, mod LVH, severe biatrial enlargement, mild MR  and normally functioning TAVR with a mean gradient of 6.4 mmHg and trivial PVL. Hospital course complicated by transient CHB with tachybrady syndrome. S/p [employer] PPM on 12/28/23 by Dr. Kitty. Post PPM CXR with possible infiltrate vs atelectasis. Given incentive spirometry. Also noted to have hyponatremia, thrombocytopenia and uncontrolled HTN. Started on Losartan 25mg  BID. He was seen back in the ER 11/1 for evaluation of hyponatremia. He was given a saline bolus and discharged home.   Today the patient presents to clinic for follow up. Here with wife and son. He is overall doing well. Biggest complaint is bilateral leg weakness which preceeded TAVR and pacemaker but now much worse. No CP or SOB. No LE edema, orthopnea or PND. No dizziness or syncope. No blood in stool or urine. No palpitations.    Past Medical History:  Diagnosis Date   Anxiety    Asthma    Asthmatic bronchitis    Atrial flutter (HCC)    chads2vasc score is 3   Ehrlichiosis 2008   GERD (gastroesophageal reflux disease)    Granulomatosis with polyangiitis (HCC)    HTN (hypertension)    Hyperlipidemia    Nuclear sclerotic cataract of left eye 06/04/2019   Cataract surgery completed March 2023, Dr. 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 Wendel and Dr. Daniel   Seasonal allergies      Current Medications: Current Meds  Medication Sig   acetaminophen  (TYLENOL ) 500 MG tablet Take 500 mg by mouth every 6 (six) hours as needed (pain/restless leg).   albuterol  (VENTOLIN  HFA) 108 (90 Base) MCG/ACT inhaler Inhale 2 puffs into the lungs every 4 (four) hours as needed for wheezing or shortness of breath.   ALPRAZolam  (XANAX ) 0.25 MG tablet Take 0.125 mg by mouth at bedtime as needed for sleep.   apixaban  (ELIQUIS ) 5 MG TABS tablet Take 1  tablet (5 mg total) by mouth 2 (two) times daily.   Ascorbic Acid (VITAMIN C PO) Take 1 tablet by mouth every morning.   Azelastine  HCl 137 MCG/SPRAY SOLN Place 2 sprays into both nostrils 2 (two) times daily.   azithromycin  (ZITHROMAX ) 500 MG tablet Take 1 tablet (500 mg total) by mouth as directed. Take one tablet 1 hour before any dental work including cleanings.   Benralizumab  (FASENRA  [place]) Inject into the skin every 8 (eight) weeks.   brimonidine  (ALPHAGAN ) 0.2 % ophthalmic solution Place 1 drop into the right eye 2 (two) times daily.   Calcium  Carbonate-Vitamin D (CALCIUM -VITAMIN D) 600-125 MG-UNIT TABS Take 1 tablet by mouth 2 (two) times daily.    diltiazem  (CARDIZEM  CD) 180 MG 24 hr capsule TAKE 1 CAPSULE EVERY       MORNING AND AT BEDTIME   diphenhydrAMINE  (BENADRYL ) 25 MG tablet Take 25 mg by mouth daily as needed for allergies.   EPINEPHrine  0.3 mg/0.3 mL IJ SOAJ injection SMARTSIG:0.3 Milligram(s) IM Once (Patient taking differently: Inject 0.3 mg into the muscle as needed for anaphylaxis. SMARTSIG:0.3 Milligram(s) IM Once)   famotidine (PEPCID) 20 MG tablet Take 20 mg by mouth at bedtime.   Ferrous Sulfate  Dried (SLOW RELEASE IRON) 45 MG TBCR Take 45 mg by mouth daily.   levocetirizine (XYZAL ) 5 MG tablet TAKE 1 TABLET EVERY EVENING   levothyroxine  (SYNTHROID ) 88 MCG tablet Take 88 mcg by mouth daily before breakfast.   losartan (COZAAR) 25 MG tablet Take 1 tablet (25 mg total) by mouth 2 (two) times daily.   montelukast  (SINGULAIR ) 10 MG tablet Take 1 tablet (10 mg total) by mouth at bedtime.   Multiple Vitamin (MULTIVITAMIN WITH MINERALS) TABS tablet Take 1 tablet daily by mouth.   Multiple Vitamins-Minerals (OCUVITE PRESERVISION PO) Take 1 tablet 2 (two) times daily by mouth.   pantoprazole  (PROTONIX ) 40 MG tablet Take 40 mg by mouth every morning.   Polyethyl Glycol-Propyl Glycol (SYSTANE OP) Place 1 drop into both eyes 3 (three) times daily as needed (dry/irritated eyes.).    rosuvastatin  (CRESTOR ) 5 MG tablet Take 1 tablet (5 mg total) by mouth daily.   SYMBICORT  160-4.5 MCG/ACT inhaler Inhale 2 puffs into the lungs 2 (two) times daily.   Current Facility-Administered Medications for the 01/01/24 encounter (Office Visit) with Sebastian Lamarr SAUNDERS, PA-C  Medication   Benralizumab  SOSY 30 mg      ROS:   Please see the history of present illness.    All other systems reviewed and are negative.  EKGs       Risk Assessment/Calculations:    CHA2DS2-VASc Score = 3   This indicates a 3.2% annual risk of stroke. The patient's score is based upon: CHF History: 0 HTN History: 1 Diabetes History: 0 Stroke History: 0 Vascular Disease History: 0 Age Score: 2  Gender Score: 0          Physical Exam:    VS:  BP (!) 110/56   Pulse 73   Ht 5' 9 (1.753 m)   Wt 136 lb 6.4 oz (61.9 kg)   SpO2 98%   BMI 20.14 kg/m     Wt Readings from Last 3 Encounters:  01/01/24 136 lb 6.4 oz (61.9 kg)  12/29/23 134 lb 4.2 oz (60.9 kg)  12/14/23 136 lb 9.6 oz (62 kg)     GEN: Tall thin, white male.  NECK: No JVD CARDIAC: RRR, no murmurs, rubs, gallops RESPIRATORY:  Clear to auscultation without rales, wheezing or rhonchi  ABDOMEN: Soft, non-tender, non-distended EXTREMITIES:  No edema; No deformity.  Groin sites clear without hematoma or ecchymosis.   ASSESSMENT:    1. S/P TAVR (transcatheter aortic valve replacement)   2. Pacemaker   3. Permanent atrial fibrillation (HCC)   4. Essential hypertension   5. Hyperlipidemia LDL goal <70   6. Abnormal CXR   7. Hyponatremia   8. Thrombocytopenia     PLAN:    In order of problems listed above:  Severe AS s/p TAVR:  -- Pt doing well s/p TAVR.  -- Groin sites healing well.  -- SBE prophylaxis discussed; I have RX'd azithromycin  due to a PCN allergy.  -- Continue Eliquis  5mg  BID. -- Biggest complaint is LE weakness which I believe will improve with PT/cardiac rehab.  -- Continue restrictions for PPM  placement. Will clear for cardiac rehab at that point in time.  -- I will see back for 1 month echo and OV: 02/08/24.  S/p PPM: -- S/p [employer] PPM on 12/28/23 by Dr. Kennyth.    Permanent atrial fibrillation:  -- Continue Cardizem  CD 180 mg daily and Eliquis  5mg  BID.   Hypertension:  -- BP well controlled today and at home per wife report.  -- Continue Cardizem  CD 180 mg daily and Losartan 25mg  daily to BID.   Hyperlipidemia:  -- Continue Crestor  5 mg daily.   -- Lipids followed by PCP.   CXR with LLL atelectasis vs infiltrate -- No s/s infection.  -- Repeat CXR 11/1 with improvement.   Hyponatremia: -- NA 125--> 127. -- BMET today   Thrombocytopenia: -- Plat 166--> 85--> 86. -- Likely reactive. -- CBC today.     Cardiac Rehabilitation Eligibility Assessment  The patient is NOT ready to start cardiac rehabilitation due to: Other  CAN BE CLEARED WHEN HE SEES WOUND CHECK. DO NOT NEED TO WAIT UNTIL DECEMBER VISIT   Medication Adjustments/Labs and Tests Ordered: Current medicines are reviewed at length with the patient today.  Concerns regarding medicines are outlined above.  Orders Placed This Encounter  Procedures   Basic Metabolic Panel (BMET)   CBC w/Diff   ECHOCARDIOGRAM COMPLETE   Meds ordered this encounter  Medications   azithromycin  (ZITHROMAX ) 500 MG tablet    Sig: Take 1 tablet (500 mg total) by mouth as directed. Take one tablet 1 hour before any dental work including cleanings.    Dispense:  6 tablet    Refill:  12    Supervising Provider:   WONDA SHARPER [[id number]]    Patient Instructions  Medication Instructions:  Your physician has recommended you make the following change in your medication: Start Amoxicillin 500 mg, take 4 tablets by mouth 1 hour prior to dental procedures and cleanings.    *If you need a refill on your cardiac medications before your next appointment, please  call your pharmacy*  Lab Work: TODAY BMET and CBC If you  have labs (blood work) drawn today and your tests are completely normal, you will receive your results only by: MyChart Message (if you have MyChart) OR A paper copy in the mail If you have any lab test that is abnormal or we need to change your treatment, we will call you to review the results.  Testing/Procedures: 02/08/24 Your physician has requested that you have an echocardiogram. Echocardiography is a painless test that uses sound waves to create images of your heart. It provides your doctor with information about the size and shape of your heart and how well your heart's chambers and valves are working. This procedure takes approximately one hour. There are no restrictions for this procedure. Please do NOT wear cologne, perfume, aftershave, or lotions (deodorant is allowed). Please arrive 15 minutes prior to your appointment time.  Please note: We ask at that you not bring children with you during ultrasound (echo/ vascular) testing. Due to room size and safety concerns, children are not allowed in the ultrasound rooms during exams. Our front office staff cannot provide observation of children in our lobby area while testing is being conducted. An adult accompanying a patient to their appointment will only be allowed in the ultrasound room at the discretion of the ultrasound technician under special circumstances. We apologize for any inconvenience.   Follow-Up: At [facility], you and your health needs are our priority.  As part of our continuing mission to provide you with exceptional heart care, our providers are all part of one team.  This team includes your primary Cardiologist (physician) and Advanced Practice Providers or APPs (Physician Assistants and Nurse Practitioners) who all work together to provide you with the care you need, when you need it.  Your next appointment:   As scheduled on 02/08/24  Provider:   Izetta Hummer, PA-C  We recommend signing up for the  patient portal called MyChart.  Sign up information is provided on this After Visit Summary.  MyChart is used to connect with patients for Virtual Visits (Telemedicine).  Patients are able to view lab/test results, encounter notes, upcoming appointments, etc.  Non-urgent messages can be sent to your provider as well.   To learn more about what you can do with MyChart, go to [email or web address].           Signed, Lamarr Hummer, PA-C  01/01/2024 12:26 PM    [facility]

## 2024-01-01 NOTE — Telephone Encounter (Signed)
 Pt insurance is active and benefits verified through Medicare a/b Co-pay 0, DED $257/$257 met, out of pocket 0/0 met, co-insurance 20%. no pre-authorization required. Passport, 01/01/2024@3 :38, REF# [id number]  2ndary insurance is active and benefits verified through [employer]. Co-pay 0, DED 0/0 met, out of pocket 0/0 met, co-insurance 0%. No pre-authorization required, Mel/[employer] 01/01/24@3 :15, REF# [id number]  TCR/ICR? ICR Visit(date of service)limitation? No limit Can multiple codes be used on the same date of service/visit?(IF ITS A LIMIT) n/a  Is this a lifetime maximum or an annual maximum? Annual Has the member used any of these services to date? no Is there a time limit (weeks/months) on start of program and/or program completion? no  Will contact patient to see if he is interested in the Cardiac Rehab Program. If interested, patient will need to complete follow up appt. Once completed, patient will be contacted for scheduling upon review by the RN Navigator.

## 2024-01-01 NOTE — Patient Instructions (Signed)
 Medication Instructions:  Your physician has recommended you make the following change in your medication: Start Amoxicillin 500 mg, take 4 tablets by mouth 1 hour prior to dental procedures and cleanings.    *If you need a refill on your cardiac medications before your next appointment, please call your pharmacy*  Lab Work: TODAY BMET and CBC If you have labs (blood work) drawn today and your tests are completely normal, you will receive your results only by: MyChart Message (if you have MyChart) OR A paper copy in the mail If you have any lab test that is abnormal or we need to change your treatment, we will call you to review the results.  Testing/Procedures: 02/08/24 Your physician has requested that you have an echocardiogram. Echocardiography is a painless test that uses sound waves to create images of your heart. It provides your doctor with information about the size and shape of your heart and how well your heart's chambers and valves are working. This procedure takes approximately one hour. There are no restrictions for this procedure. Please do NOT wear cologne, perfume, aftershave, or lotions (deodorant is allowed). Please arrive 15 minutes prior to your appointment time.  Please note: We ask at that you not bring children with you during ultrasound (echo/ vascular) testing. Due to room size and safety concerns, children are not allowed in the ultrasound rooms during exams. Our front office staff cannot provide observation of children in our lobby area while testing is being conducted. An adult accompanying a patient to their appointment will only be allowed in the ultrasound room at the discretion of the ultrasound technician under special circumstances. We apologize for any inconvenience.   Follow-Up: At [facility], you and your health needs are our priority.  As part of our continuing mission to provide you with exceptional heart care, our providers are all part of one  team.  This team includes your primary Cardiologist (physician) and Advanced Practice Providers or APPs (Physician Assistants and Nurse Practitioners) who all work together to provide you with the care you need, when you need it.  Your next appointment:   As scheduled on 02/08/24  Provider:   Izetta Hummer, PA-C  We recommend signing up for the patient portal called MyChart.  Sign up information is provided on this After Visit Summary.  MyChart is used to connect with patients for Virtual Visits (Telemedicine).  Patients are able to view lab/test results, encounter notes, upcoming appointments, etc.  Non-urgent messages can be sent to your provider as well.   To learn more about what you can do with MyChart, go to [email or web address].

## 2024-01-01 NOTE — Telephone Encounter (Signed)
 Called patient to see if he is interested in the Cardiac Rehab Program. Patient expressed interest. Explained scheduling process and went over insurance, patient verbalized understanding. Will contact patient for scheduling once f/u has been completed.

## 2024-01-01 NOTE — Progress Notes (Signed)
 Until cleared by wound check.

## 2024-01-01 NOTE — Progress Notes (Signed)
 Pacemaker restictions

## 2024-01-02 ENCOUNTER — Ambulatory Visit: Payer: Self-pay | Admitting: Physician Assistant

## 2024-01-02 ENCOUNTER — Other Ambulatory Visit: Payer: Self-pay

## 2024-01-02 DIAGNOSIS — I1310 Hypertensive heart and chronic kidney disease without heart failure, with stage 1 through stage 4 chronic kidney disease, or unspecified chronic kidney disease: Secondary | ICD-10-CM | POA: Diagnosis not present

## 2024-01-02 DIAGNOSIS — M3130 Wegener's granulomatosis without renal involvement: Secondary | ICD-10-CM | POA: Diagnosis not present

## 2024-01-02 DIAGNOSIS — N182 Chronic kidney disease, stage 2 (mild): Secondary | ICD-10-CM

## 2024-01-02 DIAGNOSIS — E871 Hypo-osmolality and hyponatremia: Secondary | ICD-10-CM | POA: Diagnosis not present

## 2024-01-02 DIAGNOSIS — E039 Hypothyroidism, unspecified: Secondary | ICD-10-CM | POA: Diagnosis not present

## 2024-01-02 DIAGNOSIS — Z7951 Long term (current) use of inhaled steroids: Secondary | ICD-10-CM | POA: Diagnosis not present

## 2024-01-02 DIAGNOSIS — F419 Anxiety disorder, unspecified: Secondary | ICD-10-CM | POA: Diagnosis not present

## 2024-01-02 DIAGNOSIS — J4489 Other specified chronic obstructive pulmonary disease: Secondary | ICD-10-CM | POA: Diagnosis not present

## 2024-01-02 DIAGNOSIS — J4550 Severe persistent asthma, uncomplicated: Secondary | ICD-10-CM | POA: Diagnosis not present

## 2024-01-02 DIAGNOSIS — J849 Interstitial pulmonary disease, unspecified: Secondary | ICD-10-CM | POA: Diagnosis not present

## 2024-01-02 DIAGNOSIS — I081 Rheumatic disorders of both mitral and tricuspid valves: Secondary | ICD-10-CM | POA: Diagnosis not present

## 2024-01-02 DIAGNOSIS — I495 Sick sinus syndrome: Secondary | ICD-10-CM | POA: Diagnosis not present

## 2024-01-02 DIAGNOSIS — Z952 Presence of prosthetic heart valve: Secondary | ICD-10-CM | POA: Diagnosis not present

## 2024-01-02 DIAGNOSIS — I4892 Unspecified atrial flutter: Secondary | ICD-10-CM | POA: Diagnosis not present

## 2024-01-02 DIAGNOSIS — Z48812 Encounter for surgical aftercare following surgery on the circulatory system: Secondary | ICD-10-CM | POA: Diagnosis not present

## 2024-01-02 DIAGNOSIS — H409 Unspecified glaucoma: Secondary | ICD-10-CM | POA: Diagnosis not present

## 2024-01-02 DIAGNOSIS — I2510 Atherosclerotic heart disease of native coronary artery without angina pectoris: Secondary | ICD-10-CM | POA: Diagnosis not present

## 2024-01-02 DIAGNOSIS — I4821 Permanent atrial fibrillation: Secondary | ICD-10-CM | POA: Diagnosis not present

## 2024-01-02 DIAGNOSIS — Z87891 Personal history of nicotine dependence: Secondary | ICD-10-CM | POA: Diagnosis not present

## 2024-01-02 DIAGNOSIS — D696 Thrombocytopenia, unspecified: Secondary | ICD-10-CM | POA: Diagnosis not present

## 2024-01-02 DIAGNOSIS — Z950 Presence of cardiac pacemaker: Secondary | ICD-10-CM | POA: Diagnosis not present

## 2024-01-02 DIAGNOSIS — I442 Atrioventricular block, complete: Secondary | ICD-10-CM | POA: Diagnosis not present

## 2024-01-02 DIAGNOSIS — G4700 Insomnia, unspecified: Secondary | ICD-10-CM | POA: Diagnosis not present

## 2024-01-02 DIAGNOSIS — E7800 Pure hypercholesterolemia, unspecified: Secondary | ICD-10-CM | POA: Diagnosis not present

## 2024-01-02 DIAGNOSIS — H3530 Unspecified macular degeneration: Secondary | ICD-10-CM | POA: Diagnosis not present

## 2024-01-02 LAB — CBC WITH DIFFERENTIAL/PLATELET
Basophils Absolute: 0 x10E3/uL (ref 0.0–0.2)
Basos: 0 %
EOS (ABSOLUTE): 0 x10E3/uL (ref 0.0–0.4)
Eos: 0 %
Hematocrit: 34.8 % — ABNORMAL LOW (ref 37.5–51.0)
Hemoglobin: 11.4 g/dL — ABNORMAL LOW (ref 13.0–17.7)
Immature Grans (Abs): 0 x10E3/uL (ref 0.0–0.1)
Immature Granulocytes: 0 %
Lymphocytes Absolute: 1 x10E3/uL (ref 0.7–3.1)
Lymphs: 19 %
MCH: 29.2 pg (ref 26.6–33.0)
MCHC: 32.8 g/dL (ref 31.5–35.7)
MCV: 89 fL (ref 79–97)
Monocytes Absolute: 0.9 x10E3/uL (ref 0.1–0.9)
Monocytes: 17 %
Neutrophils Absolute: 3.5 x10E3/uL (ref 1.4–7.0)
Neutrophils: 64 %
Platelets: 110 x10E3/uL — ABNORMAL LOW (ref 150–450)
RBC: 3.9 x10E6/uL — ABNORMAL LOW (ref 4.14–5.80)
RDW: 12.5 % (ref 11.6–15.4)
WBC: 5.5 x10E3/uL (ref 3.4–10.8)

## 2024-01-02 LAB — BASIC METABOLIC PANEL WITH GFR
BUN/Creatinine Ratio: 21 (ref 10–24)
BUN: 27 mg/dL (ref 8–27)
CO2: 19 mmol/L — ABNORMAL LOW (ref 20–29)
Calcium: 9.1 mg/dL (ref 8.6–10.2)
Chloride: 93 mmol/L — ABNORMAL LOW (ref 96–106)
Creatinine, Ser: 1.3 mg/dL — ABNORMAL HIGH (ref 0.76–1.27)
Glucose: 96 mg/dL (ref 70–99)
Potassium: 3.9 mmol/L (ref 3.5–5.2)
Sodium: 128 mmol/L — ABNORMAL LOW (ref 134–144)
eGFR: 53 mL/min/1.73 — ABNORMAL LOW (ref >59)

## 2024-01-03 ENCOUNTER — Telehealth: Payer: Self-pay

## 2024-01-03 NOTE — Telephone Encounter (Signed)
 Follow-up after same day discharge: Implant date: 12/28/2023 MD: Kennyth Device: PPM  Location: L chest    Wound check visit: 01/10/2024 90 day MD follow-up: 04/10/2024  Remote Transmission received:yes  Dressing/sling removed: yes  Confirm OAC restart on: yes   Please continue to monitor your cardiac device site for redness, swelling, and drainage. Call the device clinic at [phone] if you experience these symptoms, fever/chills, or have questions about your device.   Remote monitoring is used to monitor your cardiac device from home. This monitoring is scheduled every 91 days by our office. It allows us  to keep an eye on the functioning of your device to ensure it is working properly.

## 2024-01-05 DIAGNOSIS — Z48812 Encounter for surgical aftercare following surgery on the circulatory system: Secondary | ICD-10-CM | POA: Diagnosis not present

## 2024-01-05 DIAGNOSIS — I4892 Unspecified atrial flutter: Secondary | ICD-10-CM | POA: Diagnosis not present

## 2024-01-05 DIAGNOSIS — I442 Atrioventricular block, complete: Secondary | ICD-10-CM | POA: Diagnosis not present

## 2024-01-05 DIAGNOSIS — I081 Rheumatic disorders of both mitral and tricuspid valves: Secondary | ICD-10-CM | POA: Diagnosis not present

## 2024-01-05 DIAGNOSIS — I4821 Permanent atrial fibrillation: Secondary | ICD-10-CM | POA: Diagnosis not present

## 2024-01-05 DIAGNOSIS — I495 Sick sinus syndrome: Secondary | ICD-10-CM | POA: Diagnosis not present

## 2024-01-08 ENCOUNTER — Encounter: Payer: Self-pay | Admitting: Internal Medicine

## 2024-01-08 DIAGNOSIS — I495 Sick sinus syndrome: Secondary | ICD-10-CM | POA: Diagnosis not present

## 2024-01-08 DIAGNOSIS — I4892 Unspecified atrial flutter: Secondary | ICD-10-CM | POA: Diagnosis not present

## 2024-01-08 DIAGNOSIS — I081 Rheumatic disorders of both mitral and tricuspid valves: Secondary | ICD-10-CM | POA: Diagnosis not present

## 2024-01-08 DIAGNOSIS — I442 Atrioventricular block, complete: Secondary | ICD-10-CM | POA: Diagnosis not present

## 2024-01-08 DIAGNOSIS — Z48812 Encounter for surgical aftercare following surgery on the circulatory system: Secondary | ICD-10-CM | POA: Diagnosis not present

## 2024-01-08 DIAGNOSIS — I4821 Permanent atrial fibrillation: Secondary | ICD-10-CM | POA: Diagnosis not present

## 2024-01-09 ENCOUNTER — Ambulatory Visit

## 2024-01-10 ENCOUNTER — Ambulatory Visit: Payer: Self-pay | Admitting: Cardiology

## 2024-01-10 ENCOUNTER — Other Ambulatory Visit: Payer: Self-pay | Admitting: *Deleted

## 2024-01-10 ENCOUNTER — Ambulatory Visit: Attending: Student in an Organized Health Care Education/Training Program

## 2024-01-10 DIAGNOSIS — E871 Hypo-osmolality and hyponatremia: Secondary | ICD-10-CM

## 2024-01-10 DIAGNOSIS — N182 Chronic kidney disease, stage 2 (mild): Secondary | ICD-10-CM

## 2024-01-10 DIAGNOSIS — I442 Atrioventricular block, complete: Secondary | ICD-10-CM | POA: Insufficient documentation

## 2024-01-10 LAB — CUP PACEART INCLINIC DEVICE CHECK
Date Time Interrogation Session: 20251112094507
Implantable Lead Connection Status: 753985
Implantable Lead Implant Date: 20251030
Implantable Lead Location: 753860
Implantable Lead Model: 7842
Implantable Lead Serial Number: 1528187
Implantable Pulse Generator Implant Date: 20251030
Lead Channel Impedance Value: 732 Ohm
Lead Channel Pacing Threshold Amplitude: 1.1 V
Lead Channel Pacing Threshold Pulse Width: 0.4 ms
Lead Channel Sensing Intrinsic Amplitude: 6.3 mV
Lead Channel Setting Pacing Amplitude: 3.5 V
Lead Channel Setting Pacing Pulse Width: 0.4 ms
Lead Channel Setting Sensing Sensitivity: 2.5 mV
Pulse Gen Serial Number: 956561
Zone Setting Status: 755011

## 2024-01-10 LAB — BASIC METABOLIC PANEL WITH GFR
BUN/Creatinine Ratio: 19 (ref 10–24)
BUN: 21 mg/dL (ref 8–27)
CO2: 23 mmol/L (ref 20–29)
Calcium: 9.4 mg/dL (ref 8.6–10.2)
Chloride: 102 mmol/L (ref 96–106)
Creatinine, Ser: 1.08 mg/dL (ref 0.76–1.27)
Glucose: 76 mg/dL (ref 70–99)
Potassium: 5 mmol/L (ref 3.5–5.2)
Sodium: 138 mmol/L (ref 134–144)
eGFR: 66 mL/min/1.73 (ref >59)

## 2024-01-10 NOTE — Patient Instructions (Signed)

## 2024-01-10 NOTE — Progress Notes (Signed)
 Normal single chamber pacemaker wound check. Presenting rhythm: VS 85. Small open area noted on distal end of wound incision. Educated to keep incision site clean and dry. Will schedule 1 week wound recheck. Routine testing performed. Thresholds, sensing, and impedance consistent with implant measurements and at 3.5V safety margin/auto capture until 3 month visit. No episodes. Reviewed arm restrictions to continue for 6 weeks total post op.  Pt enrolled in remote follow-up.  Device clinic appointment scheduled for 01/17/2024 @ 10:00 am for wound recheck.

## 2024-01-11 ENCOUNTER — Ambulatory Visit: Payer: Self-pay | Admitting: Physician Assistant

## 2024-01-12 DIAGNOSIS — Z48812 Encounter for surgical aftercare following surgery on the circulatory system: Secondary | ICD-10-CM | POA: Diagnosis not present

## 2024-01-12 DIAGNOSIS — I495 Sick sinus syndrome: Secondary | ICD-10-CM | POA: Diagnosis not present

## 2024-01-12 DIAGNOSIS — I081 Rheumatic disorders of both mitral and tricuspid valves: Secondary | ICD-10-CM | POA: Diagnosis not present

## 2024-01-12 DIAGNOSIS — I4821 Permanent atrial fibrillation: Secondary | ICD-10-CM | POA: Diagnosis not present

## 2024-01-12 DIAGNOSIS — I442 Atrioventricular block, complete: Secondary | ICD-10-CM | POA: Diagnosis not present

## 2024-01-12 DIAGNOSIS — I4892 Unspecified atrial flutter: Secondary | ICD-10-CM | POA: Diagnosis not present

## 2024-01-15 DIAGNOSIS — I442 Atrioventricular block, complete: Secondary | ICD-10-CM | POA: Diagnosis not present

## 2024-01-15 DIAGNOSIS — I4892 Unspecified atrial flutter: Secondary | ICD-10-CM | POA: Diagnosis not present

## 2024-01-15 DIAGNOSIS — I4821 Permanent atrial fibrillation: Secondary | ICD-10-CM | POA: Diagnosis not present

## 2024-01-15 DIAGNOSIS — Z48812 Encounter for surgical aftercare following surgery on the circulatory system: Secondary | ICD-10-CM | POA: Diagnosis not present

## 2024-01-15 DIAGNOSIS — I495 Sick sinus syndrome: Secondary | ICD-10-CM | POA: Diagnosis not present

## 2024-01-15 DIAGNOSIS — I081 Rheumatic disorders of both mitral and tricuspid valves: Secondary | ICD-10-CM | POA: Diagnosis not present

## 2024-01-17 ENCOUNTER — Ambulatory Visit: Attending: Cardiovascular Disease

## 2024-01-17 DIAGNOSIS — I442 Atrioventricular block, complete: Secondary | ICD-10-CM

## 2024-01-17 NOTE — Progress Notes (Signed)
 Pt seen in follow up for wound reevaluation.  Pt seen last week in device clinic for routine wound care follow up.  Small open area noted at distal end of incision.  Pt is back today to reevaluate site to ensure healing.  Small scab noted at distal end of incision.  No redness, swelling or drainage noted.  Site healing well.    Pt advised to continue to monitor and contact device clinic if any s/s of infection.  No further follow up needed at this time.  Pt will call if any concerns.

## 2024-01-17 NOTE — Patient Instructions (Signed)
 Follow up as scheduled.

## 2024-01-18 DIAGNOSIS — I4821 Permanent atrial fibrillation: Secondary | ICD-10-CM | POA: Diagnosis not present

## 2024-01-18 DIAGNOSIS — I081 Rheumatic disorders of both mitral and tricuspid valves: Secondary | ICD-10-CM | POA: Diagnosis not present

## 2024-01-18 DIAGNOSIS — I442 Atrioventricular block, complete: Secondary | ICD-10-CM | POA: Diagnosis not present

## 2024-01-18 DIAGNOSIS — Z48812 Encounter for surgical aftercare following surgery on the circulatory system: Secondary | ICD-10-CM | POA: Diagnosis not present

## 2024-01-18 DIAGNOSIS — I495 Sick sinus syndrome: Secondary | ICD-10-CM | POA: Diagnosis not present

## 2024-01-18 DIAGNOSIS — I4892 Unspecified atrial flutter: Secondary | ICD-10-CM | POA: Diagnosis not present

## 2024-01-18 NOTE — Progress Notes (Signed)
 Patient ID: Chad Morrison MRN: [id number] DOB/AGE: 1932/12/20 89 y.o.  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 male with the indicated medical history here for recommendations regarding his severe aortic stenosis seen on echocardiogram.  The patient was referred by nurse practitioner Caroll in the atrial fibrillation clinic the patient was seen in April of this year and was doing relatively well.   The patient is here with his wife.  He is relatively active.  He exercises at the [facility] on a regular basis using a stationary bike.  He denies any exertional dyspnea, exertional angina, presyncope or syncope.  His atrial fibrillation has not really bother him whatsoever.  He has not required emergency room visits or hospitalizations recently for any breathing difficulties.  He has had no issues with bleeding or bruising while on Eliquis .  In terms of other symptoms his Wegener's granulomatosis is very well controlled on immunotherapy.   He is been retired for a long time.  He moved to [place] some years ago because his son is here.  He used to live in [place]  since the 90s.  He does not smoke or drink.  He sees a education officer, community on a regular basis and reports very good dental health.  Plan:  Follow up in 3 months.  August 2023:  The patient returns for 40-month follow-up.  The patient was seen in April regarding his aortic valvular  disease.  At that point in time he was he was doing relatively well and was asymptomatic with regards to his aortic stenosis.  The patient is here with his wife.  He continues to be very active.  He walks [employer] [place] on a regular basis.  When he goes up hills he might have to stop but this is relatively unchanged versus prior.  He has required no emergency room visits or hospitalizations.  He denies any exertional angina, presyncope or syncope.  His biggest issue recently was left eye pain and this is being actively managed by ophthalmology.  Plan:  Monitor for now, follow up 3 months.  February 2024: In the interim since I saw him last an echocardiogram was done which demonstrated progression to severe aortic stenosis with a preserved ejection fraction.  He was recently admitted to the hospital for probable pneumonia and treated with antibiotics.  He was in the hospital for 1 night.  He is about 12 days out from his hospitalization.  He is feeling better but not back up to speed.  He is still able to walk [place] and do exercises at the [name] with only slight shortness of breath.  He does not believe that this is too limiting.  He denies any presyncope, syncope, or chest pain.  Plan: 80-month follow-up.  August 2024:  The patient was admitted to the hospital with lower extremity edema and found to be profoundly hyponatremic.  His hyponatremia was thought to be due to inadequately treated hypothyroidism.  He was administered IV levothyroxine  with improvement in his edema and sodium levels.  He was  seen in cardiology clinic in April and was improved however he did note some shortness of breath with activity.  2+ peripheral edema was present.  His Lasix was increased to 40 mg twice a day for 2 days.  He was seen in atrial fibrillation clinic and again in general cardiology clinic after that and was feeling well without any reports of dyspnea or exertional angina.  He continues to do well.  Because he had  been on prednisone  long-term he does have lower extremity myopathy that limits his ambulation.  However he never gets short of breath when he has to stop and take a rest because of his leg fatigue.  He denies any chest pain, presyncope, or syncope.  He is walking about a mile or 2 every day.  [facility].  He is getting stronger since being discharged from the hospital.  He does see a dentist on a regular basis and reports good dental health.  Plan: Follow-up in 6 months.  March 2025:  Patient consents to use of AI scribe. The patient returns for routine follow-up.  His aortic valve stenosis has worsened slightly, but his heart function remains normal. No chest discomfort, chest tightness, lightheadedness, blacking out spells, or dizziness during physical activity. He maintains his usual level of physical activity, walking a mile twice a week with his dog, though he sometimes needs to stop and rest due to leg fatigue rather than dyspnea. His walking ability is unchanged from a year ago.  He has a history of atrial fibrillation and a right bundle branch block. He has not experienced any strokes. He is aware of the potential need for a pacemaker in the future due to his cardiac condition.  He underwent cataract surgery on his right eye, which resulted in complications. The cataract splintered during removal, causing ongoing irritation despite corrective measures for the past two to three years.  Plan: Continue medical therapy, follow-up in 6 months with repeat echocardiogram  September 2025:  Patient consents to use of AI scribe. The patient contacted our office reporting increasing fatigue.  An echocardiogram demonstrated progression of aortic stenosis.  He is here to discuss further.  He experiences worsening shortness of breath and fatigue, significantly impacting his daily activities, such [place], due to increased leg fatigue and  tiredness. His legs feel tired even upon waking in the morning.  He recalls a recent leg injury caused by a heavy receiver falling on it, resulting in a significant gash that bled heavily due to his use of Eliquis . He sought care at an urgent care center where his leg was wrapped tightly to stop the bleeding. He followed up with a wound care specialist and wore wraps for about a month, during which he ceased walking. Although the leg injury has mostly healed, he continues to experience shortness of breath and fatigue upon resuming physical activity.  No lightheadedness, chest discomfort, or palpitations. His symptoms of shortness of breath and fatigue occur only with exertion and not at rest. He prefers to sleep with his head elevated, attributing this to long-standing allergies, and denies any breathing difficulties while lying flat.  Plan refer for TAVR evaluation.  November 2025:  Patient consents to use of AI scribe. The patient returns for follow-up.  In the interim he underwent TAVR complicated by tachybradycardia syndrome.  He underwent pacemaker placement.  He was seen in the emergency department thereafter with hyponatremia.  He was given a saline bolus and discharged home.  He reached out earlier this month due to dizziness.  He was scheduled for follow-up.  His pacemaker was interrogated thereafter and showed normal function without significant rhythm issues.  He experiences episodes of dizziness, which have improved and are not constant, primarily occurring during physical activity. He notes a significant improvement in recovery time after physical exertion since his valve replacement, although he does not feel a substantial difference otherwise.  He is currently on Eliquis  and reports bruising as a side effect. He is not taking losartan  and questions whether he should resume it, as his blood pressure readings at home have been around 130/60, which is lower than his pre-surgery levels of  170-180.  He has lost weight, dropping to 130 pounds at one point, but has since regained to 134-135 pounds. No fevers or night sweats.  He is gradually resuming physical activity, including walking and exercises, but notes his legs are not as strong as they should be. He experiences leg pain and jumpiness at night, for which he occasionally takes Tylenol . He is still undergoing physical therapy at home and has not yet started cardiac rehabilitation.  He reports discomfort in his arm related to the pacemaker, especially with use, and takes Tylenol  for relief. It has been four weeks since his procedure.          Past Medical History:  Diagnosis Date   Anxiety    Asthma    Asthmatic bronchitis    Atrial flutter (HCC)    chads2vasc score is 3   Ehrlichiosis 2008   GERD (gastroesophageal reflux disease)    Granulomatosis with polyangiitis (HCC)    HTN (hypertension)    Hyperlipidemia    Nuclear sclerotic cataract of left eye 06/04/2019   Cataract surgery completed March 2023, Dr. Lavonia   Paroxysmal atrial fibrillation [facility])    Prostate cancer (HCC)    S/P TAVR (transcatheter aortic valve replacement) 12/26/2023   s/p TAVR with a 26 mm Edwards Sapien 3 Ultra Resilia THV via the TF approachby Dr. Wendel and Dr. Daniel   Seasonal allergies     Past Surgical History:  Procedure Laterality Date   BASAL CELL CARCINOMA EXCISION  2013   EYELID   HERNIA REPAIR Bilateral 2000, 2002   INTRAOPERATIVE TRANSTHORACIC ECHOCARDIOGRAM N/A 12/26/2023   Procedure: ECHOCARDIOGRAM, TRANSTHORACIC;  Surgeon: Wendel Lurena POUR, MD;  Location: [facility] INVASIVE CV LAB;  Service: Cardiovascular;  Laterality: N/A;   KNEE ARTHROSCOPY  2000   PACEMAKER IMPLANT N/A 12/28/2023   Procedure: PACEMAKER IMPLANT;  Surgeon: Kennyth Chew, MD;  Location: [facility] INVASIVE CV LAB;  Service: Cardiovascular;  Laterality: N/A;   prostectomy  1995   RADICAL   RIGHT/LEFT HEART CATH AND CORONARY ANGIOGRAPHY N/A 11/06/2023   Procedure:  RIGHT/LEFT HEART CATH AND CORONARY ANGIOGRAPHY;  Surgeon: Wendel Lurena POUR, MD;  Location: [facility] INVASIVE CV LAB;  Service: Cardiovascular;  Laterality: N/A;   TONSILLECTOMY  1940    Family History  Problem Relation Age of Onset   Asthma Mother 24   Allergies Mother    Ulcers Mother    CVA Father 47   Heart Problems Father    Other Father        NEUROLOGIC ISSUES    Other Brother 17       BICYCLE ACCIDENT   Migraines Son    Diabetes Mellitus I Son     Social History   Socioeconomic History   Marital status: Married  Spouse name: [name]   Number of children: 2   Years of education: Not on file   Highest education level: Not on file  Occupational History   Occupation: RETIRED  Tobacco Use   Smoking status: Former    Current packs/day: 0.00    Average packs/day: 0.5 packs/day for 4.0 years (2.0 ttl pk-yrs)    Types: Cigarettes    Start date: 02/28/1957    Quit date: 02/28/1961    Years since quitting: 62.9   Smokeless tobacco: Never   Tobacco comments:    Former smoker 06/20/22  Vaping Use   Vaping status: Never Used  Substance and Sexual Activity   Alcohol  use: Yes    Alcohol /week: 2.0 - 3.0 standard drinks of alcohol     Types: 2 - 3 Standard drinks or equivalent per week   Drug use: No   Sexual activity: Not on file  Other Topics Concern   Not on file  Social History Narrative   Pt recently moved to [place] from [place]  to be near his son.   Retired.   Social Drivers of Corporate Investment Banker Strain: Not on file  Food Insecurity: No Food Insecurity (12/26/2023)   Hunger Vital Sign    Worried About Running Out of Food in the Last Year: Never true    Ran Out of Food in the Last Year: Never true  Transportation Needs: No Transportation Needs (12/26/2023)   PRAPARE - Administrator, Civil Service (Medical): No    Lack of Transportation (Non-Medical): No  Physical Activity: Not on file  Stress: Not on file  Social Connections: Unknown  (12/26/2023)   Social Connection and Isolation Panel    Frequency of Communication with Friends and Family: More than three times a week    Frequency of Social Gatherings with Friends and Family: More than three times a week    Attends Religious Services: More than 4 times per year    Active Member of [employer] or Organizations: Yes    Attends Banker Meetings: 1 to 4 times per year    Marital Status: Not on file  Intimate Partner Violence: Not At Risk (12/26/2023)   Humiliation, Afraid, Rape, and Kick questionnaire    Fear of Current or Ex-Partner: No    Emotionally Abused: No    Physically Abused: No    Sexually Abused: No     Prior to Admission medications   Medication Sig Start Date End Date Taking? Authorizing Provider  acetaminophen  (TYLENOL ) 500 MG tablet Take 500 mg by mouth every 6 (six) hours as needed (pain/restless leg).    [provider]  albuterol  (VENTOLIN  HFA) 108 (90 Base) MCG/ACT inhaler Inhale 2 puffs into the lungs every 4 (four) hours as needed for wheezing or shortness of breath. 10/24/23   Kozlow, Camellia PARAS, MD  ALPRAZolam  (XANAX ) 0.25 MG tablet Take 0.125 mg by mouth at bedtime as needed for sleep.    [provider]  apixaban  (ELIQUIS ) 5 MG TABS tablet Take 1 tablet (5 mg total) by mouth 2 (two) times daily. 12/07/23   Yilia Sacca K, MD  Ascorbic Acid (VITAMIN C PO) Take 1 tablet by mouth every morning.    [provider]  Azelastine  HCl 137 MCG/SPRAY SOLN Place 2 sprays into both nostrils 2 (two) times daily. 10/24/23   Kozlow, Camellia PARAS, MD  azithromycin  (ZITHROMAX ) 500 MG tablet Take 1 tablet (500 mg total) by mouth as directed. Take one tablet 1 hour before  any dental work including cleanings. 01/01/24   Sebastian Lamarr SAUNDERS, PA-C  Benralizumab  (FASENRA  [place]) Inject into the skin every 8 (eight) weeks.    [provider]  brimonidine  (ALPHAGAN ) 0.2 % ophthalmic solution Place 1 drop into the right eye 2 (two) times daily.  08/27/23   [provider]  Calcium  Carbonate-Vitamin D (CALCIUM -VITAMIN D) 600-125 MG-UNIT TABS Take 1 tablet by mouth 2 (two) times daily.     [provider]  diltiazem  (CARDIZEM  CD) 180 MG 24 hr capsule TAKE 1 CAPSULE EVERY       MORNING AND AT BEDTIME 11/13/23   Neidy Guerrieri K, MD  diphenhydrAMINE  (BENADRYL ) 25 MG tablet Take 25 mg by mouth daily as needed for allergies.    [provider]  EPINEPHrine  0.3 mg/0.3 mL IJ SOAJ injection SMARTSIG:0.3 Milligram(s) IM Once Patient taking differently: Inject 0.3 mg into the muscle as needed for anaphylaxis. SMARTSIG:0.3 Milligram(s) IM Once 12/27/22   Kozlow, Camellia PARAS, MD  famotidine  (PEPCID ) 20 MG tablet Take 20 mg by mouth at bedtime. 03/30/23   [provider]  Ferrous Sulfate  Dried (SLOW RELEASE IRON) 45 MG TBCR Take 45 mg by mouth daily.    [provider]  levocetirizine (XYZAL ) 5 MG tablet TAKE 1 TABLET EVERY EVENING 08/25/23   Ambs, Arlean HERO, FNP  levothyroxine  (SYNTHROID ) 88 MCG tablet Take 88 mcg by mouth daily before breakfast.    [provider]  losartan  (COZAAR ) 25 MG tablet Take 1 tablet (25 mg total) by mouth 2 (two) times daily. 12/29/23   Sebastian Lamarr SAUNDERS, PA-C  montelukast  (SINGULAIR ) 10 MG tablet Take 1 tablet (10 mg total) by mouth at bedtime. 11/15/23   Kozlow, Eric J, MD  Multiple Vitamin (MULTIVITAMIN WITH MINERALS) TABS tablet Take 1 tablet daily by mouth.    [provider]  Multiple Vitamins-Minerals (OCUVITE PRESERVISION PO) Take 1 tablet 2 (two) times daily by mouth.    [provider]  pantoprazole  (PROTONIX ) 40 MG tablet Take 40 mg by mouth every morning. 03/30/23   [provider]  Polyethyl Glycol-Propyl Glycol (SYSTANE OP) Place 1 drop into both eyes 3 (three) times daily as needed (dry/irritated eyes.).    [provider]  rosuvastatin  (CRESTOR ) 5 MG tablet Take 1 tablet (5 mg total) by mouth daily. 08/22/23   Rogina Schiano K, MD   SYMBICORT  160-4.5 MCG/ACT inhaler Inhale 2 puffs into the lungs 2 (two) times daily. 10/24/23   Kozlow, Camellia PARAS, MD    Allergies  Allergen Reactions   Penicillins Hives    Has patient had a PCN reaction causing immediate rash, facial/tongue/throat swelling, SOB or lightheadedness with hypotension: No Has patient had a PCN reaction causing severe rash involving mucus membranes or skin necrosis: No Has patient had a PCN reaction that required hospitalization: No Has patient had a PCN reaction occurring within the last 10 years: Yes If all of the above answers are NO, then may proceed with Cephalosporin use.    Latex Hives and Rash   Sulfa Antibiotics Nausea Only        Sulfamethoxazole-Trimethoprim Nausea Only    REVIEW OF SYSTEMS:  General: no fevers/chills/night sweats Eyes: no blurry vision, diplopia, or amaurosis ENT: no sore throat or hearing loss Resp: no cough, wheezing, or hemoptysis CV: no edema or palpitations GI: no abdominal pain, nausea, vomiting, diarrhea, or constipation GU: no dysuria, frequency, or hematuria Skin: no rash Neuro: no headache, numbness, tingling, or weakness of extremities Musculoskeletal: no joint pain  or swelling Heme: no bleeding, DVT, or easy bruising Endo: no polydipsia or polyuria  BP 138/62   Pulse 74   Ht 5' 9 (1.753 m)   Wt 130 lb (59 kg)   SpO2 98%   BMI 19.20 kg/m   PHYSICAL EXAM: GEN:  AO x 3 in no acute distress HEENT: normal Dentition: Normal Neck: JVP normal. +2 carotid upstrokes without bruits. No thyromegaly. Lungs: equal expansion, clear bilaterally CV: Apex is discrete and nondisplaced, RRR without murmur or gallop Abd: soft, non-tender, non-distended; no bruit; positive bowel sounds Ext: no edema, ecchymoses, or cyanosis Vascular: 2+ femoral pulses, 2+ radial pulses       Skin: warm and dry without rash Neuro: CN II-XII grossly intact; motor and sensory grossly intact    DATA AND STUDIES:  EKG:  EKG  Interpretation Date/Time:  Friday January 19 2024 14:29:16 EST Ventricular Rate:  77 PR Interval:    QRS Duration:  160 QT Interval:  430 QTC Calculation: 486 R Axis:   1  Text Interpretation: Atrial fibrillation with occasional ventricular-paced complexes Right bundle branch block When compared with ECG of 30-Dec-2023 12:10, PREVIOUS ECG IS PRESENT Confirmed by Wendel Haws ([id number]) on 01/19/2024 2:33:58 PM        CARDIAC STUDIES: Refer to CV Procedures and Imaging Tabs  12/22/2023: ALT 17 12/28/2023: Magnesium  2.0 01/01/2024: Hemoglobin 11.4; Platelets 110 01/10/2024: BUN 21; Creatinine, Ser 1.08; Potassium 5.0; Sodium 138       ASSESSMENT AND PLAN:   1. Dizziness   2. S/P TAVR (transcatheter aortic valve replacement)   3. Pacemaker   4. Permanent atrial fibrillation (HCC)   5. Secondary hypercoagulable state   6. Essential hypertension   7. Hyperlipidemia LDL goal <70   8. Limited granulomatosis with polyangiitis (HCC)   9. CKD (chronic kidney disease) stage 2, GFR 60-89 ml/min   10. Hyponatremia     Dizziness: Reassuring pacemaker interrogation.  Echocardiogram prior to discharge also reassuring.  Continue to hold losartan  and will restart later.  His dizziness seems to have resolved.  It may have been related to stress.  Unclear etiology.  Patient will be seen next month for 30-day TAVR follow-up.  I will see the patient back in 9 months. Status post TAVR: Echocardiogram in October demonstrated good valve function with preserved ejection fraction and trivial PVL.  Continue Eliquis  5 mg twice daily.  Patient is participating in home PT.  After this is concluded they will let me know and we will refer him for cardiac rehabilitation. Status post permanent pacemaker: Followed by EP. Permanent atrial fibrillation: Continue Eliquis  5 mg twice daily, diltiazem  180 mg daily Secondary hypercoagulable state: Continue Eliquis  5 mg twice daily Hypertension: BP well-controlled.   The patient has not been taking losartan .  Will continue to hold losartan  since he has had some lightheadedness at times.  I am hopeful that in the future we will be able to restart losartan  at perhaps a lower dose.  Patient will keep a blood pressure log which will inform therapy moving forward. Hyperlipidemia: Continue rosuvastatin  5 mg daily.  Check lipid panel and LFTs today. Granulomatosis: Continue Fasenra  30 mg every 8 weeks CKD stage II: See discussion about losartan  above.  Will defer SGLT2 inhibitor in this advanced age individual. Hyponatremia: Will check BMP today  I have personally reviewed the patients imaging data as summarized above.  I have reviewed the natural history of aortic stenosis with the patient and family members who are present today. We  have discussed the limitations of medical therapy and the poor prognosis associated with symptomatic aortic stenosis. We have also reviewed potential treatment options, including palliative medical therapy, conventional surgical aortic valve replacement, and transcatheter aortic valve replacement. We discussed treatment options in the context of this patient's specific comorbid medical conditions.   All of the patient's questions were answered today. Will make further recommendations based on the results of studies outlined above.   I spent 38 minutes reviewing all clinical data during and prior to this visit including all relevant imaging studies, laboratories, clinical information from other health systems and prior notes from both Cardiology and other specialties, interviewing the patient, conducting a complete physical examination, and coordinating care in order to formulate a comprehensive and personalized evaluation and treatment plan.   Sussan Meter K Ridge Lafond, MD  01/19/2024 2:55 PM    [facility] [address] [place], [place], [place]  [address] Phone: [phone]; Fax: [phone]

## 2024-01-19 ENCOUNTER — Encounter: Payer: Self-pay | Admitting: Internal Medicine

## 2024-01-19 ENCOUNTER — Ambulatory Visit: Attending: Internal Medicine | Admitting: Internal Medicine

## 2024-01-19 VITALS — BP 138/62 | HR 74 | Ht 69.0 in | Wt 130.0 lb

## 2024-01-19 DIAGNOSIS — Z950 Presence of cardiac pacemaker: Secondary | ICD-10-CM | POA: Diagnosis not present

## 2024-01-19 DIAGNOSIS — Z952 Presence of prosthetic heart valve: Secondary | ICD-10-CM | POA: Insufficient documentation

## 2024-01-19 DIAGNOSIS — I4821 Permanent atrial fibrillation: Secondary | ICD-10-CM | POA: Diagnosis not present

## 2024-01-19 DIAGNOSIS — I10 Essential (primary) hypertension: Secondary | ICD-10-CM | POA: Insufficient documentation

## 2024-01-19 DIAGNOSIS — D6869 Other thrombophilia: Secondary | ICD-10-CM | POA: Insufficient documentation

## 2024-01-19 DIAGNOSIS — N182 Chronic kidney disease, stage 2 (mild): Secondary | ICD-10-CM | POA: Diagnosis not present

## 2024-01-19 DIAGNOSIS — E871 Hypo-osmolality and hyponatremia: Secondary | ICD-10-CM | POA: Insufficient documentation

## 2024-01-19 DIAGNOSIS — E785 Hyperlipidemia, unspecified: Secondary | ICD-10-CM | POA: Insufficient documentation

## 2024-01-19 DIAGNOSIS — M3130 Wegener's granulomatosis without renal involvement: Secondary | ICD-10-CM | POA: Diagnosis not present

## 2024-01-19 DIAGNOSIS — R42 Dizziness and giddiness: Secondary | ICD-10-CM | POA: Insufficient documentation

## 2024-01-19 LAB — LIPID PANEL

## 2024-01-19 NOTE — Patient Instructions (Signed)
 Medication Instructions:  Stop Losartan  *If you need a refill on your cardiac medications before your next appointment, please call your pharmacy*  Lab Work: Today- CMP, Lipids If you have labs (blood work) drawn today and your tests are completely normal, you will receive your results only by: MyChart Message (if you have MyChart) OR A paper copy in the mail If you have any lab test that is abnormal or we need to change your treatment, we will call you to review the results.   Follow-Up: At [facility], you and your health needs are our priority.  As part of our continuing mission to provide you with exceptional heart care, our providers are all part of one team.  This team includes your primary Cardiologist (physician) and Advanced Practice Providers or APPs (Physician Assistants and Nurse Practitioners) who all work together to provide you with the care you need, when you need it.  Your next appointment:   9 month(s)  Provider:   Lurena Red, MD

## 2024-01-20 ENCOUNTER — Ambulatory Visit: Payer: Self-pay | Admitting: Internal Medicine

## 2024-01-20 LAB — COMPREHENSIVE METABOLIC PANEL WITH GFR
ALT: 13 IU/L (ref 0–44)
AST: 18 IU/L (ref 0–40)
Albumin: 3.9 g/dL (ref 3.7–4.7)
Alkaline Phosphatase: 80 IU/L (ref 48–129)
BUN/Creatinine Ratio: 24 (ref 10–24)
BUN: 29 mg/dL — AB (ref 8–27)
Bilirubin Total: 0.3 mg/dL (ref 0.0–1.2)
CO2: 22 mmol/L (ref 20–29)
Calcium: 8.9 mg/dL (ref 8.6–10.2)
Chloride: 104 mmol/L (ref 96–106)
Creatinine, Ser: 1.23 mg/dL (ref 0.76–1.27)
Globulin, Total: 2.3 g/dL (ref 1.5–4.5)
Glucose: 89 mg/dL (ref 70–99)
Potassium: 4.5 mmol/L (ref 3.5–5.2)
Sodium: 140 mmol/L (ref 134–144)
Total Protein: 6.2 g/dL (ref 6.0–8.5)
eGFR: 56 mL/min/1.73 — AB (ref >59)

## 2024-01-20 LAB — LIPID PANEL
Cholesterol, Total: 140 mg/dL (ref 100–199)
HDL: 73 mg/dL (ref >39)
LDL CALC COMMENT:: 1.9 ratio (ref 0.0–5.0)
LDL Chol Calc (NIH): 55 mg/dL (ref 0–99)
Triglycerides: 59 mg/dL (ref 0–149)
VLDL Cholesterol Cal: 12 mg/dL (ref 5–40)

## 2024-01-22 ENCOUNTER — Telehealth: Payer: Self-pay | Admitting: Internal Medicine

## 2024-01-22 DIAGNOSIS — I4821 Permanent atrial fibrillation: Secondary | ICD-10-CM | POA: Diagnosis not present

## 2024-01-22 DIAGNOSIS — I4892 Unspecified atrial flutter: Secondary | ICD-10-CM | POA: Diagnosis not present

## 2024-01-22 DIAGNOSIS — I081 Rheumatic disorders of both mitral and tricuspid valves: Secondary | ICD-10-CM | POA: Diagnosis not present

## 2024-01-22 DIAGNOSIS — I442 Atrioventricular block, complete: Secondary | ICD-10-CM | POA: Diagnosis not present

## 2024-01-22 DIAGNOSIS — I495 Sick sinus syndrome: Secondary | ICD-10-CM | POA: Diagnosis not present

## 2024-01-22 DIAGNOSIS — Z48812 Encounter for surgical aftercare following surgery on the circulatory system: Secondary | ICD-10-CM | POA: Diagnosis not present

## 2024-01-22 MED ORDER — LOSARTAN POTASSIUM 25 MG PO TABS: 37.5000 mg | ORAL_TABLET | Freq: Every day | ORAL | 3 refills | Status: DC

## 2024-01-22 NOTE — Telephone Encounter (Signed)
 STAT if patient feels like he/she is going to faint   Are you dizzy, lightheaded, or faint now? Dizzy, was little more intense then ususally  Have you passed out? No IF YES MOVE TO .SYNCOPECVD  Do you have any other symptoms?    Have you checked your HR and BP (record if available)? 164/85; 128/66

## 2024-01-22 NOTE — Telephone Encounter (Signed)
 Pt recently seen in office (on Friday).  He reports he has not been taking Losartan  since he come home from the hospital and BP has been good.  He reports it had been elevated during his hospitalization d/t being uncomfortable and frustrated but better since back home.  Yesterday however he reports BP went really high - 165/85.  He then resumed taking his Losartan  25 mg last night and also took it again this AM.  He reports BP being back to his normal since restarting Losartan .  He will continue to monitor blood pressures and notify of any changes.  Pt also complains for 2 episodes of dizziness today.  He reports they last 2 to 3 minutes and occur when he is concentrating on something or under stress.  Not r/t positional changes.  Advised I will forward this information to Dr Wendel for his knowledge and review.

## 2024-01-22 NOTE — Telephone Encounter (Signed)
 Spoke with pt and his wife and explained Dr. Parry recommendations. Pt originally stated he had restarted his Losartan  25 mg BID and wanted to stay on that dose. Explained that because the 25 mg BID dose had been discontinued in the hospital, Dr. Wendel would like to restart at a smaller dose (37.5 mg) once daily at bedtime. Pt verbalized understanding of plan and agreed to do 37.5 mg going forward. Pt advised to take an additional 12.5 mg (half tablet) dose tonight since he already took 25 mg this AM. Advised pt to call our office on Wednesday to let us  know how his BP was trending with this dose since our office is closed Thursday (01/25/24) until Monday (01/29/24). Pt verbalized understanding of plan.

## 2024-01-24 ENCOUNTER — Other Ambulatory Visit: Payer: Self-pay

## 2024-01-24 ENCOUNTER — Encounter: Payer: Self-pay | Admitting: Internal Medicine

## 2024-01-24 MED ORDER — LOSARTAN POTASSIUM 50 MG PO TABS: 50.0000 mg | ORAL_TABLET | Freq: Two times a day (BID) | ORAL | 3 refills | Status: DC

## 2024-01-24 NOTE — Telephone Encounter (Signed)
 Pt's spouse would like a c/b from a nurse concerning pt BP please advise

## 2024-01-24 NOTE — Telephone Encounter (Signed)
 Left message to call back.

## 2024-01-24 NOTE — Telephone Encounter (Signed)
 Responded to pt's MyChart message. Dr. Wendel recommended increasing Losartan  back to 50 mg BID. New prescription has been sent to pt preferred pharmacy.

## 2024-01-30 DIAGNOSIS — I442 Atrioventricular block, complete: Secondary | ICD-10-CM | POA: Diagnosis not present

## 2024-01-30 DIAGNOSIS — I4821 Permanent atrial fibrillation: Secondary | ICD-10-CM | POA: Diagnosis not present

## 2024-01-30 DIAGNOSIS — J3489 Other specified disorders of nose and nasal sinuses: Secondary | ICD-10-CM | POA: Diagnosis not present

## 2024-01-30 DIAGNOSIS — I4892 Unspecified atrial flutter: Secondary | ICD-10-CM | POA: Diagnosis not present

## 2024-01-30 DIAGNOSIS — Z48812 Encounter for surgical aftercare following surgery on the circulatory system: Secondary | ICD-10-CM | POA: Diagnosis not present

## 2024-01-30 DIAGNOSIS — M3130 Wegener's granulomatosis without renal involvement: Secondary | ICD-10-CM | POA: Diagnosis not present

## 2024-01-30 DIAGNOSIS — I081 Rheumatic disorders of both mitral and tricuspid valves: Secondary | ICD-10-CM | POA: Diagnosis not present

## 2024-01-30 DIAGNOSIS — E039 Hypothyroidism, unspecified: Secondary | ICD-10-CM | POA: Diagnosis not present

## 2024-01-30 DIAGNOSIS — I495 Sick sinus syndrome: Secondary | ICD-10-CM | POA: Diagnosis not present

## 2024-01-31 DIAGNOSIS — D485 Neoplasm of uncertain behavior of skin: Secondary | ICD-10-CM | POA: Diagnosis not present

## 2024-01-31 DIAGNOSIS — L821 Other seborrheic keratosis: Secondary | ICD-10-CM | POA: Diagnosis not present

## 2024-01-31 DIAGNOSIS — L570 Actinic keratosis: Secondary | ICD-10-CM | POA: Diagnosis not present

## 2024-01-31 DIAGNOSIS — Z85820 Personal history of malignant melanoma of skin: Secondary | ICD-10-CM | POA: Diagnosis not present

## 2024-01-31 DIAGNOSIS — L814 Other melanin hyperpigmentation: Secondary | ICD-10-CM | POA: Diagnosis not present

## 2024-01-31 DIAGNOSIS — Z08 Encounter for follow-up examination after completed treatment for malignant neoplasm: Secondary | ICD-10-CM | POA: Diagnosis not present

## 2024-01-31 DIAGNOSIS — Z85828 Personal history of other malignant neoplasm of skin: Secondary | ICD-10-CM | POA: Diagnosis not present

## 2024-01-31 DIAGNOSIS — D1801 Hemangioma of skin and subcutaneous tissue: Secondary | ICD-10-CM | POA: Diagnosis not present

## 2024-02-06 DIAGNOSIS — E039 Hypothyroidism, unspecified: Secondary | ICD-10-CM | POA: Diagnosis not present

## 2024-02-06 NOTE — Progress Notes (Signed)
 [facility] FOLLOW UP VISIT   CC/Reason for visit: Hypothyroidism secondary to Hashimoto's  HPI:  This is a 89 y.o. male who presents for a follow up visit.  The following issues were addressed in clinic today:  1. Hypothyroidism:      Patient was last seen 08/03/23   Patient was hospitalized May 30, 2022 with hyponatremia, anemia and leg swelling. Had elevated TSH (5). He was given IV levothyroxine . Had cortisol stimulation testing. Levothyroxine  was increased to 100 mcg daily.  Patient believes hyponatremia was caused by diet changes. He had eliminated all salt from diet and increased water  intake.    Visit on 07/05/22 was taking levothyroxine  100 mcg daily with glass of water  on empty stomach.  Other meds about an hour - including mulitvitamin.  TSH (0.574) due to patient's age, plan was to decrease Levothyroxine  88 mcg daily.  TFT June 2024 noted TSH (2.186) and Free T4 (1.2). Plan was to continue levothyroxine  88 mcg daily.    Visit on 01/09/23. Recent TSH (4.94) and Free T4 (1.3) on 12/28/22. Plan was to continue Levothyroxine  88 mcg daily   LV 08/03/23 with plan to continue levothyroxine  88 mcg daily   Today he reports doing well. Wife present during visit.    Has had TAVR with pacemaker placement since last visit.  Has been working with physical therapy. Seen in the ER with hyponatremia and November.  Reports drinking Gatorade to help with sodium levels.   Reports adherence with levothyroxine  88 mcg daily.    Has no complaints. Denies diarrhea, constipation, neck swelling and difficultly swallowing.     Has been off amiodarone  for over 1 year. Has history of A.fib    Family history of hypothyroidism includes his son    Objective: BP (!) 123/51 (BP Location: Left arm, Patient Position: Sitting)   Pulse 73   Wt 62.2 kg (137 lb 3.2 oz)   SpO2 100%   BMI 20.26 kg/m   Physical Exam: GENERAL:  alert, well-hydrated, well-nourished, and in no acute  distress EYES: pupils equally round; no proptosis, lid lag, or stare  VISION: grossly intact THYROID : no visible goiter CARDIOVASCULAR: HR 73  RESPIRATORY:  Normal respiratory effort EXTREMITIES:  Lower extremities warm, well perfused, no edema noted MSK: no deficits noted SKIN: no rashes, wounds, nail or skin changes NEURO:  alert, aware, answers questions appropriately PSYCH: pleasant mood   Pertinent Labs:  Lab Results  Component Value Date   TSH 1.745 01/30/2024   TSH 1.833 07/26/2023   TSH 4.943 12/28/2022   FREET4 1.2 (H) 01/30/2024   FREET4 1.1 07/26/2023   FREET4 1.3 12/28/2022    No results found for: REDGIE MAYLENE SHELLING, TSI  ASSESSMENT/PLAN: Chad Morrison is a 89 y.o. male with a past medical history of above who presents for a follow up consultation visit.  Hypothyroidism:    Reviewed recent labs with patient.  Thyroid  function test at goal.  Will continue levothyroxine  88 mcg daily.    No follow-ups on file.    Electronically signed by: Warren Gleen Batty, FNP 02/06/2024 2:29 PM

## 2024-02-08 ENCOUNTER — Ambulatory Visit

## 2024-02-08 ENCOUNTER — Ambulatory Visit: Admission: RE | Admit: 2024-02-08 | Discharge: 2024-02-08 | Attending: Internal Medicine

## 2024-02-08 ENCOUNTER — Ambulatory Visit: Admitting: Physician Assistant

## 2024-02-08 VITALS — BP 158/70 | HR 79 | Ht 69.0 in | Wt 136.9 lb

## 2024-02-08 DIAGNOSIS — E871 Hypo-osmolality and hyponatremia: Secondary | ICD-10-CM | POA: Diagnosis present

## 2024-02-08 DIAGNOSIS — I10 Essential (primary) hypertension: Secondary | ICD-10-CM | POA: Insufficient documentation

## 2024-02-08 DIAGNOSIS — E785 Hyperlipidemia, unspecified: Secondary | ICD-10-CM | POA: Diagnosis not present

## 2024-02-08 DIAGNOSIS — I4821 Permanent atrial fibrillation: Secondary | ICD-10-CM

## 2024-02-08 DIAGNOSIS — Z952 Presence of prosthetic heart valve: Secondary | ICD-10-CM | POA: Insufficient documentation

## 2024-02-08 DIAGNOSIS — D696 Thrombocytopenia, unspecified: Secondary | ICD-10-CM

## 2024-02-08 DIAGNOSIS — Z950 Presence of cardiac pacemaker: Secondary | ICD-10-CM | POA: Diagnosis present

## 2024-02-08 DIAGNOSIS — I350 Nonrheumatic aortic (valve) stenosis: Secondary | ICD-10-CM | POA: Diagnosis not present

## 2024-02-08 LAB — ECHOCARDIOGRAM COMPLETE
AV Mean grad: 4.4 mmHg
AV Peak grad: 8.3 mmHg
Ao pk vel: 1.44 m/s
S' Lateral: 3.2 cm

## 2024-02-08 MED ORDER — LOSARTAN POTASSIUM 25 MG PO TABS: 25.0000 mg | ORAL_TABLET | ORAL | Status: AC

## 2024-02-08 NOTE — Progress Notes (Unsigned)
 [facility]   MULTIDISCIPLINARY HEART VALVE CLINIC                                     Cardiology Office Note:    Date:  02/09/2024   ID:  [name], DOB 08-Mar-1932, MRN [id number]  PCP:  Vernadine Charlie ORN, MD  [facility] Cardiologist:  Arun K Thukkani, MD  [facility] Structural heart: Lurena MARLA Red, MD [facility] Electrophysiologist:  Fonda Kitty, MD   Referring MD: Vernadine Charlie ORN, MD   1 month s/p TAVR  History of Present Illness:    Chad Morrison is a 89 y.o. male with a hx of HTN, HLD, permanent atrial fibrillation on Eliquis , Wegener's granulomatosis (on immunotherapy and low dose prednisone ), CKD stage II, limited mobility d/t bilateral hammertoes, RBBB, aortic atherosclerosis and critical AS s/p TAVR (12/26/23) who presents to clinic for follow up.   He has been followed by Dr. Red for aortic stenosis but remained asymptomatic and quite active. He was admitted in 05/2022 with LE edema and profound hyponatremic thought to be due to inadequately treated hypothyroidism. He was administered IV levothyroxine  with improvement in his edema and sodium levels. He continued to do well but developed progressive exertional dyspnea and fatigue. Echo 10/23/23 showed EF 50%, and critical AS with mean grad 52 mmHg, AVA 0.45 cm2, DVI 0.16, SVI 30 and mild AI, TR & MR. Cath 11/06/23 showed mild, nonobstructive coronary artery disease. S/p successful TAVR with a 26 mm Edwards Sapien 3 Ultra Resilia THV via the TF approach on 12/26/23. Post operative echo showed EF 50-55%, mod LVH, severe biatrial enlargement, mild MR and normally functioning TAVR with a mean gradient of 6.4 mmHg and trivial PVL. Hospital course complicated by transient CHB with tachybrady syndrome. S/p [employer] PPM on 12/28/23 by Dr. Kitty. Post PPM CXR with possible infiltrate vs atelectasis. Given incentive spirometry. Also noted to have hyponatremia, thrombocytopenia and uncontrolled HTN.  Started on Losartan  25mg  BID. He was seen back in the ER 11/1 for evaluation of hyponatremia. He was given a saline bolus and discharged home.   Seen by Dr. Red on 01/19/24 for evaluation of dizziness and losartan  was held. Dizziness resolved and then developed high BPs and called back in. Losartan  added back and increased to 50mg  BID.     Today the patient presents to clinic for follow up. Here with wife. He is overall doing well. Biggest complaint is bilateral leg weakness which preceeded TAVR. This is getting better with PT which is about to wrap up. Looking forward to starting cardiac rehab. No CP or SOB. Breathing has really improved since TAVR. No LE edema, orthopnea or PND. No dizziness or syncope. No blood in stool or urine. No palpitations.    Past Medical History:  Diagnosis Date   Anxiety    Asthma    Asthmatic bronchitis    Atrial flutter (HCC)    chads2vasc score is 3   Ehrlichiosis 2008   GERD (gastroesophageal reflux disease)    Granulomatosis with polyangiitis (HCC)    HTN (hypertension)    Hyperlipidemia    Nuclear sclerotic cataract of left eye 06/04/2019   Cataract surgery completed March 2023, Dr. Lavonia   Paroxysmal atrial fibrillation [facility])    Prostate cancer (HCC)    S/P TAVR (transcatheter aortic valve replacement) 12/26/2023   s/p TAVR with a 26 mm Edwards Sapien 3 Ultra  Resilia THV via the TF approachby Dr. Wendel and Dr. Daniel   Seasonal allergies      Current Medications: Current Meds  Medication Sig   acetaminophen  (TYLENOL ) 500 MG tablet Take 500 mg by mouth every 6 (six) hours as needed (pain/restless leg).   albuterol  (VENTOLIN  HFA) 108 (90 Base) MCG/ACT inhaler Inhale 2 puffs into the lungs every 4 (four) hours as needed for wheezing or shortness of breath.   ALPRAZolam  (XANAX ) 0.25 MG tablet Take 0.125 mg by mouth at bedtime as needed for sleep.   apixaban  (ELIQUIS ) 5 MG TABS tablet Take 1 tablet (5 mg total) by mouth 2 (two) times daily.    Ascorbic Acid (VITAMIN C PO) Take 1 tablet by mouth every morning.   Azelastine  HCl 137 MCG/SPRAY SOLN Place 2 sprays into both nostrils 2 (two) times daily.   azithromycin  (ZITHROMAX ) 500 MG tablet Take 1 tablet (500 mg total) by mouth as directed. Take one tablet 1 hour before any dental work including cleanings.   Benralizumab  (FASENRA  Tina) Inject into the skin every 8 (eight) weeks.   brimonidine  (ALPHAGAN ) 0.2 % ophthalmic solution Place 1 drop into the right eye 2 (two) times daily.   Calcium  Carbonate-Vitamin D (CALCIUM -VITAMIN D) 600-125 MG-UNIT TABS Take 1 tablet by mouth 2 (two) times daily.    diltiazem  (CARDIZEM  CD) 180 MG 24 hr capsule TAKE 1 CAPSULE EVERY       MORNING AND AT BEDTIME   diphenhydrAMINE  (BENADRYL ) 25 MG tablet Take 25 mg by mouth daily as needed for allergies.   EPINEPHrine  0.3 mg/0.3 mL IJ SOAJ injection SMARTSIG:0.3 Milligram(s) IM Once (Patient taking differently: Inject 0.3 mg into the muscle as needed for anaphylaxis. SMARTSIG:0.3 Milligram(s) IM Once)   famotidine  (PEPCID ) 20 MG tablet Take 20 mg by mouth at bedtime.   Ferrous Sulfate  Dried (SLOW RELEASE IRON) 45 MG TBCR Take 45 mg by mouth daily.   levocetirizine (XYZAL ) 5 MG tablet TAKE 1 TABLET EVERY EVENING   levothyroxine  (SYNTHROID ) 88 MCG tablet Take 88 mcg by mouth daily before breakfast.   montelukast  (SINGULAIR ) 10 MG tablet Take 1 tablet (10 mg total) by mouth at bedtime.   Multiple Vitamin (MULTIVITAMIN WITH MINERALS) TABS tablet Take 1 tablet daily by mouth.   Multiple Vitamins-Minerals (OCUVITE PRESERVISION PO) Take 1 tablet 2 (two) times daily by mouth.   pantoprazole  (PROTONIX ) 40 MG tablet Take 40 mg by mouth every morning.   Polyethyl Glycol-Propyl Glycol (SYSTANE OP) Place 1 drop into both eyes 3 (three) times daily as needed (dry/irritated eyes.).   rosuvastatin  (CRESTOR ) 5 MG tablet Take 1 tablet (5 mg total) by mouth daily.   SYMBICORT  160-4.5 MCG/ACT inhaler Inhale 2 puffs into the lungs 2  (two) times daily.   [DISCONTINUED] losartan  (COZAAR ) 50 MG tablet Take 1 tablet (50 mg total) by mouth in the morning and at bedtime.   Current Facility-Administered Medications for the 02/08/24 encounter (Office Visit) with Sebastian Lamarr SAUNDERS, PA-C  Medication   Benralizumab  SOSY 30 mg      ROS:   Please see the history of present illness.    All other systems reviewed and are negative.  EKGs       Risk Assessment/Calculations:    CHA2DS2-VASc Score = 3   This indicates a 3.2% annual risk of stroke. The patient's score is based upon: CHF History: 0 HTN History: 1 Diabetes History: 0 Stroke History: 0 Vascular Disease History: 0 Age Score: 2 Gender Score: 0  Physical Exam:    VS:  BP (!) 158/70   Pulse 79   Ht 5' 9 (1.753 m)   Wt 136 lb 14.4 oz (62.1 kg)   SpO2 99%   BMI 20.22 kg/m     Wt Readings from Last 3 Encounters:  02/08/24 136 lb 14.4 oz (62.1 kg)  01/19/24 130 lb (59 kg)  01/01/24 136 lb 6.4 oz (61.9 kg)     GEN: Tall thin, white male.  NECK: No JVD CARDIAC: RRR, no murmurs, rubs, gallops RESPIRATORY:  Clear to auscultation without rales, wheezing or rhonchi  ABDOMEN: Soft, non-tender, non-distended EXTREMITIES:  No edema; No deformity.    ASSESSMENT:    1. S/P TAVR (transcatheter aortic valve replacement)   2. Pacemaker   3. Permanent atrial fibrillation (HCC)   4. Essential hypertension   5. Hyperlipidemia LDL goal <70   6. Hyponatremia   7. Thrombocytopenia      PLAN:    In order of problems listed above:  Severe AS s/p TAVR:  -- Echo today shows EF 55%, atrial enlargement, mild MR, normally functioning TAVR with a mean gradient of 4.4 mm hg and trivial PVL.   -- NYHA class I with a big improvement in breathing since TAVR.  -- SBE prophylaxis discussed; I have RX'd azithromycin  due to a PCN allergy.  -- Continue Eliquis  5mg  BID. -- I will see back for 1 year echo and OV: 02/08/24.  S/p PPM: -- S/p [employer] PPM on 12/28/23 by Dr. Kennyth.  -- Continue follow up with EP.    Permanent atrial fibrillation:  -- Continue Cardizem  CD 180 mg daily and Eliquis  5mg  BID.   Hypertension:  -- BP elevated today.  -- Continue Cardizem  CD 180 mg daily. -- Has only been taking losartan  25mg  BID. Worried 50mg  BID is too much since he has labile BP. Will increase to Losartan  50mg  in AM and 25mg  in PM -- BMET in 3 weeks (wants to wait until after Christmas)  Hyperlipidemia:  -- Continue Crestor  5 mg daily.   -- Lipids followed by PCP.   Hyponatremia: -- Now resolved.  -- NA 140 on most recent labs 01/19/24 (personally reviewed).    Thrombocytopenia: -- Plat 110 on most recent labs 01/19/24 (personally reviewed).     Cardiac Rehabilitation Eligibility Assessment  The patient is ready to start cardiac rehabilitation from a cardiac standpoint.   Medication Adjustments/Labs and Tests Ordered: Current medicines are reviewed at length with the patient today.  Concerns regarding medicines are outlined above.  Orders Placed This Encounter  Procedures   Basic Metabolic Panel (BMET)   ECHOCARDIOGRAM COMPLETE   Meds ordered this encounter  Medications   losartan  (COZAAR ) 25 MG tablet    Sig: Take 1 tablet (25 mg total) by mouth as directed. Take 50mg  in the morning and 25mg  in the evening.    Patient Instructions  Medication Instructions:  Your physician has recommended you make the following change in your medication:  CHANGE your Losartan  to taking 50mg  in the morning and 25mg  in the evening.  *If you need a refill on your cardiac medications before your next appointment, please call your pharmacy*  Lab Work: January 2026 BMET If you have labs (blood work) drawn today and your tests are completely normal, you will receive your results only by: MyChart Message (if you have MyChart) OR A paper copy in the mail If you have any lab test that is abnormal or we need to change your  treatment, we will call you to review the results.  Testing/Procedures: 12/19/24 Your physician has requested that you have an echocardiogram. Echocardiography is a painless test that uses sound waves to create images of your heart. It provides your doctor with information about the size and shape of your heart and how well your hearts chambers and valves are working. This procedure takes approximately one hour. There are no restrictions for this procedure. Please do NOT wear cologne, perfume, aftershave, or lotions (deodorant is allowed). Please arrive 15 minutes prior to your appointment time.  Please note: We ask at that you not bring children with you during ultrasound (echo/ vascular) testing. Due to room size and safety concerns, children are not allowed in the ultrasound rooms during exams. Our front office staff cannot provide observation of children in our lobby area while testing is being conducted. An adult accompanying a patient to their appointment will only be allowed in the ultrasound room at the discretion of the ultrasound technician under special circumstances. We apologize for any inconvenience.   Follow-Up: At [facility], you and your health needs are our priority.  As part of our continuing mission to provide you with exceptional heart care, our providers are all part of one team.  This team includes your primary Cardiologist (physician) and Advanced Practice Providers or APPs (Physician Assistants and Nurse Practitioners) who all work together to provide you with the care you need, when you need it.  Your next appointment:   9 month(s)  Provider:   Lurena Red, MD    We recommend signing up for the patient portal called MyChart.  Sign up information is provided on this After Visit Summary.  MyChart is used to connect with patients for Virtual Visits (Telemedicine).  Patients are able to view lab/test results, encounter notes, upcoming appointments, etc.   Non-urgent messages can be sent to your provider as well.   To learn more about what you can do with MyChart, go to [email or web address].            Signed, Lamarr Hummer, PA-C  02/09/2024 12:58 PM    [facility]

## 2024-02-08 NOTE — Patient Instructions (Signed)
 Medication Instructions:  Your physician has recommended you make the following change in your medication:  CHANGE your Losartan  to taking 50mg  in the morning and 25mg  in the evening.  *If you need a refill on your cardiac medications before your next appointment, please call your pharmacy*  Lab Work: January 2026 BMET If you have labs (blood work) drawn today and your tests are completely normal, you will receive your results only by: MyChart Message (if you have MyChart) OR A paper copy in the mail If you have any lab test that is abnormal or we need to change your treatment, we will call you to review the results.  Testing/Procedures: 12/19/24 Your physician has requested that you have an echocardiogram. Echocardiography is a painless test that uses sound waves to create images of your heart. It provides your doctor with information about the size and shape of your heart and how well your hearts chambers and valves are working. This procedure takes approximately one hour. There are no restrictions for this procedure. Please do NOT wear cologne, perfume, aftershave, or lotions (deodorant is allowed). Please arrive 15 minutes prior to your appointment time.  Please note: We ask at that you not bring children with you during ultrasound (echo/ vascular) testing. Due to room size and safety concerns, children are not allowed in the ultrasound rooms during exams. Our front office staff cannot provide observation of children in our lobby area while testing is being conducted. An adult accompanying a patient to their appointment will only be allowed in the ultrasound room at the discretion of the ultrasound technician under special circumstances. We apologize for any inconvenience.   Follow-Up: At [facility], you and your health needs are our priority.  As part of our continuing mission to provide you with exceptional heart care, our providers are all part of one team.  This team includes  your primary Cardiologist (physician) and Advanced Practice Providers or APPs (Physician Assistants and Nurse Practitioners) who all work together to provide you with the care you need, when you need it.  Your next appointment:   9 month(s)  Provider:   Lurena Red, MD    We recommend signing up for the patient portal called MyChart.  Sign up information is provided on this After Visit Summary.  MyChart is used to connect with patients for Virtual Visits (Telemedicine).  Patients are able to view lab/test results, encounter notes, upcoming appointments, etc.  Non-urgent messages can be sent to your provider as well.   To learn more about what you can do with MyChart, go to [email or web address].

## 2024-02-09 ENCOUNTER — Encounter (HOSPITAL_COMMUNITY): Payer: Self-pay | Admitting: Internal Medicine

## 2024-02-10 LAB — CUP PACEART REMOTE DEVICE CHECK
Battery Remaining Longevity: 114 mo
Battery Remaining Percentage: 100 %
Brady Statistic RV Percent Paced: 18 %
Date Time Interrogation Session: 20251211005000
Implantable Lead Connection Status: 753985
Implantable Lead Implant Date: 20251030
Implantable Lead Location: 753860
Implantable Lead Model: 7842
Implantable Lead Serial Number: 1528187
Implantable Pulse Generator Implant Date: 20251030
Lead Channel Impedance Value: 698 Ohm
Lead Channel Pacing Threshold Amplitude: 0.9 V
Lead Channel Pacing Threshold Pulse Width: 0.4 ms
Lead Channel Setting Pacing Amplitude: 3.5 V
Lead Channel Setting Pacing Pulse Width: 0.4 ms
Lead Channel Setting Sensing Sensitivity: 2.5 mV
Pulse Gen Serial Number: 956561
Zone Setting Status: 755011

## 2024-02-13 ENCOUNTER — Ambulatory Visit

## 2024-02-13 DIAGNOSIS — J4550 Severe persistent asthma, uncomplicated: Secondary | ICD-10-CM

## 2024-02-15 DIAGNOSIS — H35351 Cystoid macular degeneration, right eye: Secondary | ICD-10-CM | POA: Diagnosis not present

## 2024-02-15 DIAGNOSIS — H30041 Focal chorioretinal inflammation, macular or paramacular, right eye: Secondary | ICD-10-CM | POA: Diagnosis not present

## 2024-02-15 DIAGNOSIS — H04122 Dry eye syndrome of left lacrimal gland: Secondary | ICD-10-CM | POA: Diagnosis not present

## 2024-02-15 DIAGNOSIS — H4061X2 Glaucoma secondary to drugs, right eye, moderate stage: Secondary | ICD-10-CM | POA: Diagnosis not present

## 2024-02-15 DIAGNOSIS — H353212 Exudative age-related macular degeneration, right eye, with inactive choroidal neovascularization: Secondary | ICD-10-CM | POA: Diagnosis not present

## 2024-02-15 DIAGNOSIS — H35371 Puckering of macula, right eye: Secondary | ICD-10-CM | POA: Diagnosis not present

## 2024-02-15 DIAGNOSIS — H30001 Unspecified focal chorioretinal inflammation, right eye: Secondary | ICD-10-CM | POA: Diagnosis not present

## 2024-02-15 DIAGNOSIS — H353211 Exudative age-related macular degeneration, right eye, with active choroidal neovascularization: Secondary | ICD-10-CM | POA: Diagnosis not present

## 2024-02-15 DIAGNOSIS — H353114 Nonexudative age-related macular degeneration, right eye, advanced atrophic with subfoveal involvement: Secondary | ICD-10-CM | POA: Diagnosis not present

## 2024-02-15 DIAGNOSIS — H353122 Nonexudative age-related macular degeneration, left eye, intermediate dry stage: Secondary | ICD-10-CM | POA: Diagnosis not present

## 2024-02-15 DIAGNOSIS — H4041X0 Glaucoma secondary to eye inflammation, right eye, stage unspecified: Secondary | ICD-10-CM | POA: Diagnosis not present

## 2024-02-15 NOTE — Progress Notes (Signed)
 Remote PPM Transmission

## 2024-02-16 ENCOUNTER — Ambulatory Visit: Payer: Self-pay | Admitting: Cardiology

## 2024-02-23 ENCOUNTER — Other Ambulatory Visit: Payer: Self-pay | Admitting: Allergy and Immunology

## 2024-03-05 ENCOUNTER — Ambulatory Visit: Payer: Self-pay | Admitting: Physician Assistant

## 2024-03-05 LAB — BASIC METABOLIC PANEL WITH GFR
BUN/Creatinine Ratio: 23 (ref 10–24)
BUN: 28 mg/dL — ABNORMAL HIGH (ref 8–27)
CO2: 16 mmol/L — ABNORMAL LOW (ref 20–29)
Calcium: 9.2 mg/dL (ref 8.6–10.2)
Chloride: 108 mmol/L — ABNORMAL HIGH (ref 96–106)
Creatinine, Ser: 1.21 mg/dL (ref 0.76–1.27)
Glucose: 69 mg/dL — ABNORMAL LOW (ref 70–99)
Potassium: 4.8 mmol/L (ref 3.5–5.2)
Sodium: 142 mmol/L (ref 134–144)
eGFR: 57 mL/min/1.73 — ABNORMAL LOW

## 2024-03-11 ENCOUNTER — Telehealth (HOSPITAL_COMMUNITY): Payer: Self-pay

## 2024-03-11 NOTE — Telephone Encounter (Signed)
 Wife called and stated that her husband had completed Ptat end of December  and was ready to schedule Cardiac rehab. Then asked that we call her back to schedule because she was going in for an appointment.

## 2024-03-13 ENCOUNTER — Telehealth (HOSPITAL_COMMUNITY): Payer: Self-pay

## 2024-03-13 NOTE — Telephone Encounter (Signed)
 Pt wife called and stated that pt is ready to schedule for cardiac rehab, I advised pt's wife that our January schedule is booked and that we will be calling the pt to schedule once our Feb schedule is complete.

## 2024-03-18 ENCOUNTER — Telehealth (HOSPITAL_COMMUNITY): Payer: Self-pay

## 2024-03-18 NOTE — Telephone Encounter (Signed)
 Pt insurance is active and benefits verified through [employer]. Co-pay $0, DED $283/$0 met, out of pocket $0/$0 met, co-insurance 20%. No pre-authorization required. Passport, 03/18/2024 @ 11:55am, REF# [id number].  2ndary insurance is active and benefits verified through [employer]. Co-pay $0, DED $0/$0 met, out of pocket $0/$0 met, co-insurance 0%. No pre-authorization required. 03/18/2024 @ 12:15PM, spoke with Neisha, REF# [id number].   TCR/ICR? ICR Visit(date of service)limitation? No Can multiple codes be used on the same date of service/visit?(IF ITS A LIMIT) N/A  Is this a lifetime maximum or an annual maximum? Annual Has the member used any of these services to date? No Is there a time limit (weeks/months) on start of program and/or program completion? No

## 2024-03-18 NOTE — Telephone Encounter (Signed)
 Called patient to see if he was interested in participating in the Cardiac Rehab Program. Patient will come in for orientation on 2/05 and will attend the 11:45 exercise class.  Sent MyChart message.

## 2024-04-02 ENCOUNTER — Telehealth (HOSPITAL_COMMUNITY): Payer: Self-pay | Admitting: *Deleted

## 2024-04-02 NOTE — Telephone Encounter (Signed)
 Confirmed orientation appointment. Completed health history.Hadassah Elpidio Quan RN BSN

## 2024-04-04 ENCOUNTER — Encounter (HOSPITAL_COMMUNITY)
Admission: RE | Admit: 2024-04-04 | Discharge: 2024-04-04 | Disposition: A | Source: Ambulatory Visit | Attending: Internal Medicine

## 2024-04-04 ENCOUNTER — Encounter (HOSPITAL_COMMUNITY): Payer: Self-pay

## 2024-04-04 VITALS — BP 128/72 | HR 74 | Ht 69.0 in | Wt 137.3 lb

## 2024-04-04 DIAGNOSIS — Z952 Presence of prosthetic heart valve: Secondary | ICD-10-CM

## 2024-04-04 NOTE — Progress Notes (Signed)
 Cardiac Individual Treatment Plan  Patient Details  Name: Chad Morrison MRN: [id number] Date of Birth: May 23, 1932 Referring Provider:   Flowsheet Row INTENSIVE CARDIAC REHAB ORIENT from 04/04/2024 in [facility]  Referring Provider Lurena Red, MD    Initial Encounter Date:  Flowsheet Row INTENSIVE CARDIAC REHAB ORIENT from 04/04/2024 in [facility]  Date 04/04/24    Visit Diagnosis: 12/26/23 S/P TAVR  Patient's Home Medications on Admission: Current Medications[1]  Past Medical History: Past Medical History:  Diagnosis Date   Anxiety    Asthma    Asthmatic bronchitis    Atrial flutter (HCC)    chads2vasc score is 3   Ehrlichiosis 2008   GERD (gastroesophageal reflux disease)    Granulomatosis with polyangiitis (HCC)    HTN (hypertension)    Hyperlipidemia    Nuclear sclerotic cataract of left eye 06/04/2019   Cataract surgery completed March 2023, Dr. 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 Red and Dr. Daniel   Seasonal allergies     Tobacco Use: Tobacco Use History[2]  Labs: Review Flowsheet       Latest Ref Rng & Units 04/12/2022 11/06/2023 12/26/2023 01/19/2024  Labs for ITP Cardiac and Pulmonary Rehab  Cholestrol 100 - 199 mg/dL - - - 859   LDL (calc) 0 - 99 mg/dL - - - 55   HDL-C >60 mg/dL - - - 73   Trlycerides 0 - 149 mg/dL - - - 59   PH, Arterial 7.35 - 7.45 - 7.383  - -  PCO2 arterial 32 - 48 mmHg - 35.3  - -  Bicarbonate 20.0 - 28.0 mmol/L 22.4  18.9  22.6  21.0  - -  TCO2 22 - 32 mmol/L - 20  24  22  21   -  Acid-base deficit 0.0 - 2.0 mmol/L 2.2  7.0  3.0  4.0  - -  O2 Saturation % 26.3  61  70  91  - -    Details       Multiple values from one day are  sorted in reverse-chronological order         Capillary Blood Glucose: Lab Results  Component Value Date   GLUCAP 97 04/12/2022     Exercise Target Goals: Exercise Program Goal: Individual exercise prescription set using results from initial 6 min walk test and THRR while considering  patients activity barriers and safety.   Exercise Prescription Goal: Initial exercise prescription builds to 30-45 minutes a day of aerobic activity, 2-3 days per week.  Home exercise guidelines will be given to patient during program as part of exercise prescription that the participant will acknowledge.  Activity Barriers & Risk Stratification:  Activity Barriers & Cardiac Risk Stratification - 04/04/24 1246       Activity Barriers & Cardiac Risk Stratification   Activity Barriers Balance Concerns;Joint Problems;Deconditioning;Muscular Weakness    Cardiac Risk Stratification High          6 Minute Walk:  6 Minute Walk     Row Name 04/04/24 1152         6 Minute Walk   Phase Initial     Distance 982 feet     Walk Time 6  minutes     # of Rest Breaks 0     MPH 1.9     METS 2.2     RPE 13     Perceived Dyspnea  0     VO2 Peak 7.54     Symptoms No     Resting HR 74 bpm     Resting BP 128/72     Resting Oxygen Saturation  99 %     Exercise Oxygen Saturation  during 6 min walk 100 %     Max Ex. HR 110 bpm     Max Ex. BP 164/70     2 Minute Post BP 140/70        Oxygen Initial Assessment:   Oxygen Re-Evaluation:   Oxygen Discharge (Final Oxygen Re-Evaluation):   Initial Exercise Prescription:  Initial Exercise Prescription - 04/04/24 1200       Date of Initial Exercise RX and Referring Provider   Date 04/04/24    Referring Provider Lurena Red, MD    Expected Discharge Date 06/26/24      NuStep   Level 1    SPM 75    Minutes 25    METs 2.2      Prescription Details   Frequency (times per week) 3    Duration Progress to 30 minutes of continuous aerobic  without signs/symptoms of physical distress      Intensity   THRR 40-80% of Max Heartrate 52-105    Ratings of Perceived Exertion 11-13    Perceived Dyspnea 0-4      Progression   Progression Continue progressive overload as per policy without signs/symptoms or physical distress.      Resistance Training   Training Prescription Yes    Weight 2 lbs    Reps 10-15          Perform Capillary Blood Glucose checks as needed.  Exercise Prescription Changes:   Exercise Comments:   Exercise Goals and Review:   Exercise Goals     Row Name 04/04/24 1247             Exercise Goals   Increase Physical Activity Yes       Intervention Provide advice, education, support and counseling about physical activity/exercise needs.;Develop an individualized exercise prescription for aerobic and resistive training based on initial evaluation findings, risk stratification, comorbidities and participant's personal goals.       Expected Outcomes Short Term: Attend rehab on a regular basis to increase amount of physical activity.;Long Term: Exercising regularly at least 3-5 days a week.;Long Term: Add in home exercise to make exercise part of routine and to increase amount of physical activity.       Increase Strength and Stamina Yes       Intervention Provide advice, education, support and counseling about physical activity/exercise needs.;Develop an individualized exercise prescription for aerobic and resistive training based on initial evaluation findings, risk stratification, comorbidities and participant's personal goals.       Expected Outcomes Short Term: Increase workloads from initial exercise prescription for resistance, speed, and METs.;Short Term: Perform resistance training exercises routinely during rehab and add in resistance training at home;Long Term: Improve cardiorespiratory fitness, muscular endurance and strength as measured by increased METs and functional capacity ( )        Able to understand and use rate of perceived exertion (RPE) scale Yes       Intervention Provide education and explanation on how to use RPE scale  Expected Outcomes Short Term: Able to use RPE daily in rehab to express subjective intensity level;Long Term:  Able to use RPE to guide intensity level when exercising independently       Knowledge and understanding of Target Heart Rate Range (THRR) Yes       Intervention Provide education and explanation of THRR including how the numbers were predicted and where they are located for reference       Expected Outcomes Short Term: Able to state/look up THRR;Long Term: Able to use THRR to govern intensity when exercising independently;Short Term: Able to use daily as guideline for intensity in rehab       Understanding of Exercise Prescription Yes       Intervention Provide education, explanation, and written materials on patient's individual exercise prescription       Expected Outcomes Short Term: Able to explain program exercise prescription;Long Term: Able to explain home exercise prescription to exercise independently          Exercise Goals Re-Evaluation :   Discharge Exercise Prescription (Final Exercise Prescription Changes):   Nutrition:  Target Goals: Understanding of nutrition guidelines, daily intake of sodium 1500mg , cholesterol 200mg , calories 30% from fat and 7% or less from saturated fats, daily to have 5 or more servings of fruits and vegetables.  Biometrics:  Pre Biometrics - 04/04/24 1100       Pre Biometrics   Waist Circumference 37 inches    Hip Circumference 38 inches    Waist to Hip Ratio 0.97 %    Triceps Skinfold 6 mm    % Body Fat 20.5 %    Grip Strength 15 kg    Flexibility 0 in   Could not reach   Single Leg Stand 1.5 seconds           Nutrition Therapy Plan and Nutrition Goals:   Nutrition Assessments:  MEDIFICTS Score Key: >=70 Need to make dietary changes  40-70 Heart Healthy Diet <= 40  Therapeutic Level Cholesterol Diet    Picture Your Plate Scores: <59 Unhealthy dietary pattern with much room for improvement. 41-50 Dietary pattern unlikely to meet recommendations for good health and room for improvement. 51-60 More healthful dietary pattern, with some room for improvement.  >60 Healthy dietary pattern, although there may be some specific behaviors that could be improved.    Nutrition Goals Re-Evaluation:   Nutrition Goals Re-Evaluation:   Nutrition Goals Discharge (Final Nutrition Goals Re-Evaluation):   Psychosocial: Target Goals: Acknowledge presence or absence of significant depression and/or stress, maximize coping skills, provide positive support system. Participant is able to verbalize types and ability to use techniques and skills needed for reducing stress and depression.  Initial Review & Psychosocial Screening:  Initial Psych Review & Screening - 04/04/24 1047       Initial Review   Current issues with None Identified      Family Dynamics   Good Support System? Yes   Gene has his wife and son who lives in the area for support     Barriers   Psychosocial barriers to participate in program There are no identifiable barriers or psychosocial needs.      Screening Interventions   Interventions Encouraged to exercise          Quality of Life Scores:  Quality of Life - 04/04/24 1242       Quality of Life   Select Quality of Life      Quality of Life Scores   Health/Function Pre  26.57 %    Socioeconomic Pre 27.75 %    Psych/Spiritual Pre 28.79 %    Family Post 30 %    GLOBAL Pre 27.81 %         Scores of 19 and below usually indicate a poorer quality of life in these areas.  A difference of  2-3 points is a clinically meaningful difference.  A difference of 2-3 points in the total score of the Quality of Life Index has been associated with significant improvement in overall quality of life, self-image, physical symptoms, and general  health in studies assessing change in quality of life.  PHQ-9: Review Flowsheet       04/04/2024  Depression screen PHQ 2/9  Decreased Interest 0  Down, Depressed, Hopeless 0  PHQ - 2 Score 0  Altered sleeping 0  Tired, decreased energy 0  Change in appetite 0  Feeling bad or failure about yourself  0  Trouble concentrating 0  Moving slowly or fidgety/restless 0  Suicidal thoughts 0  PHQ-9 Score 0  Difficult doing work/chores Not difficult at all   Interpretation of Total Score  Total Score Depression Severity:  1-4 = Minimal depression, 5-9 = Mild depression, 10-14 = Moderate depression, 15-19 = Moderately severe depression, 20-27 = Severe depression   Psychosocial Evaluation and Intervention:   Psychosocial Re-Evaluation:   Psychosocial Discharge (Final Psychosocial Re-Evaluation):   Vocational Rehabilitation: Provide vocational rehab assistance to qualifying candidates.   Vocational Rehab Evaluation & Intervention:  Vocational Rehab - 04/04/24 1049       Initial Vocational Rehab Evaluation & Intervention   Assessment shows need for Vocational Rehabilitation --   Gene is retired and does not need vocational rehab at this time         Education: Education Goals: Education classes will be provided on a weekly basis, covering required topics. Participant will state understanding/return demonstration of topics presented.     Core Videos: Exercise    Move It!  Clinical staff conducted group or individual video education with verbal and written material and guidebook.  Patient learns the recommended Pritikin exercise program. Exercise with the goal of living a long, healthy life. Some of the health benefits of exercise include controlled diabetes, healthier blood pressure levels, improved cholesterol levels, improved heart and lung capacity, improved sleep, and better body composition. Everyone should speak with their doctor before starting or changing an exercise  routine.  Biomechanical Limitations Clinical staff conducted group or individual video education with verbal and written material and guidebook.  Patient learns how biomechanical limitations can impact exercise and how we can mitigate and possibly overcome limitations to have an impactful and balanced exercise routine.  Body Composition Clinical staff conducted group or individual video education with verbal and written material and guidebook.  Patient learns that body composition (ratio of muscle mass to fat mass) is a key component to assessing overall fitness, rather than body weight alone. Increased fat mass, especially visceral belly fat, can put us  at increased risk for metabolic syndrome, type 2 diabetes, heart disease, and even death. It is recommended to combine diet and exercise (cardiovascular and resistance training) to improve your body composition. Seek guidance from your physician and exercise physiologist before implementing an exercise routine.  Exercise Action Plan Clinical staff conducted group or individual video education with verbal and written material and guidebook.  Patient learns the recommended strategies to achieve and enjoy long-term exercise adherence, including variety, self-motivation, self-efficacy, and positive decision making. Benefits of exercise  include fitness, good health, weight management, more energy, better sleep, less stress, and overall well-being.  Medical   Heart Disease Risk Reduction Clinical staff conducted group or individual video education with verbal and written material and guidebook.  Patient learns our heart is our most vital organ as it circulates oxygen, nutrients, white blood cells, and hormones throughout the entire body, and carries waste away. Data supports a plant-based eating plan like the Pritikin Program for its effectiveness in slowing progression of and reversing heart disease. The video provides a number of recommendations to  address heart disease.   Metabolic Syndrome and Belly Fat  Clinical staff conducted group or individual video education with verbal and written material and guidebook.  Patient learns what metabolic syndrome is, how it leads to heart disease, and how one can reverse it and keep it from coming back. You have metabolic syndrome if you have 3 of the following 5 criteria: abdominal obesity, high blood pressure, high triglycerides, low HDL cholesterol, and high blood sugar.  Hypertension and Heart Disease Clinical staff conducted group or individual video education with verbal and written material and guidebook.  Patient learns that high blood pressure, or hypertension, is very common in the [place] . Hypertension is largely due to excessive salt intake, but other important risk factors include being overweight, physical inactivity, drinking too much alcohol , smoking, and not eating enough potassium from fruits and vegetables. High blood pressure is a leading risk factor for heart attack, stroke, congestive heart failure, dementia, kidney failure, and premature death. Long-term effects of excessive salt intake include stiffening of the arteries and thickening of heart muscle and organ damage. Recommendations include ways to reduce hypertension and the risk of heart disease.  Diseases of Our Time - Focusing on Diabetes Clinical staff conducted group or individual video education with verbal and written material and guidebook.  Patient learns why the best way to stop diseases of our time is prevention, through food and other lifestyle changes. Medicine (such as prescription pills and surgeries) is often only a Band-Aid on the problem, not a long-term solution. Most common diseases of our time include obesity, type 2 diabetes, hypertension, heart disease, and cancer. The Pritikin Program is recommended and has been proven to help reduce, reverse, and/or prevent the damaging effects of metabolic  syndrome.  Nutrition   Overview of the Pritikin Eating Plan  Clinical staff conducted group or individual video education with verbal and written material and guidebook.  Patient learns about the Pritikin Eating Plan for disease risk reduction. The Pritikin Eating Plan emphasizes a wide variety of unrefined, minimally-processed carbohydrates, like fruits, vegetables, whole grains, and legumes. Go, Caution, and Stop food choices are explained. Plant-based and lean animal proteins are emphasized. Rationale provided for low sodium intake for blood pressure control, low added sugars for blood sugar stabilization, and low added fats and oils for coronary artery disease risk reduction and weight management.  Calorie Density  Clinical staff conducted group or individual video education with verbal and written material and guidebook.  Patient learns about calorie density and how it impacts the Pritikin Eating Plan. Knowing the characteristics of the food you choose will help you decide whether those foods will lead to weight gain or weight loss, and whether you want to consume more or less of them. Weight loss is usually a side effect of the Pritikin Eating Plan because of its focus on low calorie-dense foods.  Label Reading  Clinical staff conducted group or individual video education with  verbal and written material and guidebook.  Patient learns about the Pritikin recommended label reading guidelines and corresponding recommendations regarding calorie density, added sugars, sodium content, and whole grains.  Dining Out - Part 1  Clinical staff conducted group or individual video education with verbal and written material and guidebook.  Patient learns that restaurant meals can be sabotaging because they can be so high in calories, fat, sodium, and/or sugar. Patient learns recommended strategies on how to positively address this and avoid unhealthy pitfalls.  Facts on Fats  Clinical staff conducted  group or individual video education with verbal and written material and guidebook.  Patient learns that lifestyle modifications can be just as effective, if not more so, as many medications for lowering your risk of heart disease. A Pritikin lifestyle can help to reduce your risk of inflammation and atherosclerosis (cholesterol build-up, or plaque, in the artery walls). Lifestyle interventions such as dietary choices and physical activity address the cause of atherosclerosis. A review of the types of fats and their impact on blood cholesterol levels, along with dietary recommendations to reduce fat intake is also included.  Nutrition Action Plan  Clinical staff conducted group or individual video education with verbal and written material and guidebook.  Patient learns how to incorporate Pritikin recommendations into their lifestyle. Recommendations include planning and keeping personal health goals in mind as an important part of their success.  Healthy Mind-Set    Healthy Minds, Bodies, Hearts  Clinical staff conducted group or individual video education with verbal and written material and guidebook.  Patient learns how to identify when they are stressed. Video will discuss the impact of that stress, as well as the many benefits of stress management. Patient will also be introduced to stress management techniques. The way we think, act, and feel has an impact on our hearts.  How Our Thoughts Can Heal Our Hearts  Clinical staff conducted group or individual video education with verbal and written material and guidebook.  Patient learns that negative thoughts can cause depression and anxiety. This can result in negative lifestyle behavior and serious health problems. Cognitive behavioral therapy is an effective method to help control our thoughts in order to change and improve our emotional outlook.  Additional Videos:  Exercise    Improving Performance  Clinical staff conducted group or  individual video education with verbal and written material and guidebook.  Patient learns to use a non-linear approach by alternating intensity levels and lengths of time spent exercising to help burn more calories and lose more body fat. Cardiovascular exercise helps improve heart health, metabolism, hormonal balance, blood sugar control, and recovery from fatigue. Resistance training improves strength, endurance, balance, coordination, reaction time, metabolism, and muscle mass. Flexibility exercise improves circulation, posture, and balance. Seek guidance from your physician and exercise physiologist before implementing an exercise routine and learn your capabilities and proper form for all exercise.  Introduction to Yoga  Clinical staff conducted group or individual video education with verbal and written material and guidebook.  Patient learns about yoga, a discipline of the coming together of mind, breath, and body. The benefits of yoga include improved flexibility, improved range of motion, better posture and core strength, increased lung function, weight loss, and positive self-image. Yogas heart health benefits include lowered blood pressure, healthier heart rate, decreased cholesterol and triglyceride levels, improved immune function, and reduced stress. Seek guidance from your physician and exercise physiologist before implementing an exercise routine and learn your capabilities and proper form for all  exercise.  Medical   Aging: Enhancing Your Quality of Life  Clinical staff conducted group or individual video education with verbal and written material and guidebook.  Patient learns key strategies and recommendations to stay in good physical health and enhance quality of life, such as prevention strategies, having an advocate, securing a Health Care Proxy and Power of Attorney, and keeping a list of medications and system for tracking them. It also discusses how to avoid risk for bone  loss.  Biology of Weight Control  Clinical staff conducted group or individual video education with verbal and written material and guidebook.  Patient learns that weight gain occurs because we consume more calories than we burn (eating more, moving less). Even if your body weight is normal, you may have higher ratios of fat compared to muscle mass. Too much body fat puts you at increased risk for cardiovascular disease, heart attack, stroke, type 2 diabetes, and obesity-related cancers. In addition to exercise, following the Pritikin Eating Plan can help reduce your risk.  Decoding Lab Results  Clinical staff conducted group or individual video education with verbal and written material and guidebook.  Patient learns that lab test reflects one measurement whose values change over time and are influenced by many factors, including medication, stress, sleep, exercise, food, hydration, pre-existing medical conditions, and more. It is recommended to use the knowledge from this video to become more involved with your lab results and evaluate your numbers to speak with your doctor.   Diseases of Our Time - Overview  Clinical staff conducted group or individual video education with verbal and written material and guidebook.  Patient learns that according to the CDC, 50% to 70% of chronic diseases (such as obesity, type 2 diabetes, elevated lipids, hypertension, and heart disease) are avoidable through lifestyle improvements including healthier food choices, listening to satiety cues, and increased physical activity.  Sleep Disorders Clinical staff conducted group or individual video education with verbal and written material and guidebook.  Patient learns how good quality and duration of sleep are important to overall health and well-being. Patient also learns about sleep disorders and how they impact health along with recommendations to address them, including discussing with a physician.  Nutrition   Dining Out - Part 2 Clinical staff conducted group or individual video education with verbal and written material and guidebook.  Patient learns how to plan ahead and communicate in order to maximize their dining experience in a healthy and nutritious manner. Included are recommended food choices based on the type of restaurant the patient is visiting.   Fueling a Banker conducted group or individual video education with verbal and written material and guidebook.  There is a strong connection between our food choices and our health. Diseases like obesity and type 2 diabetes are very prevalent and are in large-part due to lifestyle choices. The Pritikin Eating Plan provides plenty of food and hunger-curbing satisfaction. It is easy to follow, affordable, and helps reduce health risks.  Menu Workshop  Clinical staff conducted group or individual video education with verbal and written material and guidebook.  Patient learns that restaurant meals can sabotage health goals because they are often packed with calories, fat, sodium, and sugar. Recommendations include strategies to plan ahead and to communicate with the manager, chef, or server to help order a healthier meal.  Planning Your Eating Strategy  Clinical staff conducted group or individual video education with verbal and written material and guidebook.  Patient learns  about the Pritikin Eating Plan and its benefit of reducing the risk of disease. The Pritikin Eating Plan does not focus on calories. Instead, it emphasizes high-quality, nutrient-rich foods. By knowing the characteristics of the foods, we choose, we can determine their calorie density and make informed decisions.  Targeting Your Nutrition Priorities  Clinical staff conducted group or individual video education with verbal and written material and guidebook.  Patient learns that lifestyle habits have a tremendous impact on disease risk and progression. This  video provides eating and physical activity recommendations based on your personal health goals, such as reducing LDL cholesterol, losing weight, preventing or controlling type 2 diabetes, and reducing high blood pressure.  Vitamins and Minerals  Clinical staff conducted group or individual video education with verbal and written material and guidebook.  Patient learns different ways to obtain key vitamins and minerals, including through a recommended healthy diet. It is important to discuss all supplements you take with your doctor.   Healthy Mind-Set    Smoking Cessation  Clinical staff conducted group or individual video education with verbal and written material and guidebook.  Patient learns that cigarette smoking and tobacco addiction pose a serious health risk which affects millions of people. Stopping smoking will significantly reduce the risk of heart disease, lung disease, and many forms of cancer. Recommended strategies for quitting are covered, including working with your doctor to develop a successful plan.  Culinary   Becoming a Set Designer conducted group or individual video education with verbal and written material and guidebook.  Patient learns that cooking at home can be healthy, cost-effective, quick, and puts them in control. Keys to cooking healthy recipes will include looking at your recipe, assessing your equipment needs, planning ahead, making it simple, choosing cost-effective seasonal ingredients, and limiting the use of added fats, salts, and sugars.  Cooking - Breakfast and Snacks  Clinical staff conducted group or individual video education with verbal and written material and guidebook.  Patient learns how important breakfast is to satiety and nutrition through the entire day. Recommendations include key foods to eat during breakfast to help stabilize blood sugar levels and to prevent overeating at meals later in the day. Planning ahead is also a  key component.  Cooking - Educational Psychologist conducted group or individual video education with verbal and written material and guidebook.  Patient learns eating strategies to improve overall health, including an approach to cook more at home. Recommendations include thinking of animal protein as a side on your plate rather than center stage and focusing instead on lower calorie dense options like vegetables, fruits, whole grains, and plant-based proteins, such as beans. Making sauces in large quantities to freeze for later and leaving the skin on your vegetables are also recommended to maximize your experience.  Cooking - Healthy Salads and Dressing Clinical staff conducted group or individual video education with verbal and written material and guidebook.  Patient learns that vegetables, fruits, whole grains, and legumes are the foundations of the Pritikin Eating Plan. Recommendations include how to incorporate each of these in flavorful and healthy salads, and how to create homemade salad dressings. Proper handling of ingredients is also covered. Cooking - Soups and [employer] - Soups and Desserts Clinical staff conducted group or individual video education with verbal and written material and guidebook.  Patient learns that Pritikin soups and desserts make for easy, nutritious, and delicious snacks and meal components that are low in sodium,  fat, sugar, and calorie density, while high in vitamins, minerals, and filling fiber. Recommendations include simple and healthy ideas for soups and desserts.   Overview     The Pritikin Solution Program Overview Clinical staff conducted group or individual video education with verbal and written material and guidebook.  Patient learns that the results of the Pritikin Program have been documented in more than 100 articles published in peer-reviewed journals, and the benefits include reducing risk factors for (and, in some cases, even  reversing) high cholesterol, high blood pressure, type 2 diabetes, obesity, and more! An overview of the three key pillars of the Pritikin Program will be covered: eating well, doing regular exercise, and having a healthy mind-set.  WORKSHOPS  Exercise: Exercise Basics: Building Your Action Plan Clinical staff led group instruction and group discussion with PowerPoint presentation and patient guidebook. To enhance the learning environment the use of posters, models and videos may be added. At the conclusion of this workshop, patients will comprehend the difference between physical activity and exercise, as well as the benefits of incorporating both, into their routine. Patients will understand the FITT (Frequency, Intensity, Time, and Type) principle and how to use it to build an exercise action plan. In addition, safety concerns and other considerations for exercise and cardiac rehab will be addressed by the presenter. The purpose of this lesson is to promote a comprehensive and effective weekly exercise routine in order to improve patients overall level of fitness.   Managing Heart Disease: Your Path to a Healthier Heart Clinical staff led group instruction and group discussion with PowerPoint presentation and patient guidebook. To enhance the learning environment the use of posters, models and videos may be added.At the conclusion of this workshop, patients will understand the anatomy and physiology of the heart. Additionally, they will understand how Pritikins three pillars impact the risk factors, the progression, and the management of heart disease.  The purpose of this lesson is to provide a high-level overview of the heart, heart disease, and how the Pritikin lifestyle positively impacts risk factors.  Exercise Biomechanics Clinical staff led group instruction and group discussion with PowerPoint presentation and patient guidebook. To enhance the learning environment the use of  posters, models and videos may be added. Patients will learn how the structural parts of their bodies function and how these functions impact their daily activities, movement, and exercise. Patients will learn how to promote a neutral spine, learn how to manage pain, and identify ways to improve their physical movement in order to promote healthy living. The purpose of this lesson is to expose patients to common physical limitations that impact physical activity. Participants will learn practical ways to adapt and manage aches and pains, and to minimize their effect on regular exercise. Patients will learn how to maintain good posture while sitting, walking, and lifting.  Balance Training and Fall Prevention  Clinical staff led group instruction and group discussion with PowerPoint presentation and patient guidebook. To enhance the learning environment the use of posters, models and videos may be added. At the conclusion of this workshop, patients will understand the importance of their sensorimotor skills (vision, proprioception, and the vestibular system) in maintaining their ability to balance as they age. Patients will apply a variety of balancing exercises that are appropriate for their current level of function. Patients will understand the common causes for poor balance, possible solutions to these problems, and ways to modify their physical environment in order to minimize their fall risk. The purpose of  this lesson is to teach patients about the importance of maintaining balance as they age and ways to minimize their risk of falling.  WORKSHOPS   Nutrition:  Fueling a Ship Broker led group instruction and group discussion with PowerPoint presentation and patient guidebook. To enhance the learning environment the use of posters, models and videos may be added. Patients will review the foundational principles of the Pritikin Eating Plan and understand what constitutes a  serving size in each of the food groups. Patients will also learn Pritikin-friendly foods that are better choices when away from home and review make-ahead meal and snack options. Calorie density will be reviewed and applied to three nutrition priorities: weight maintenance, weight loss, and weight gain. The purpose of this lesson is to reinforce (in a group setting) the key concepts around what patients are recommended to eat and how to apply these guidelines when away from home by planning and selecting Pritikin-friendly options. Patients will understand how calorie density may be adjusted for different weight management goals.  Mindful Eating  Clinical staff led group instruction and group discussion with PowerPoint presentation and patient guidebook. To enhance the learning environment the use of posters, models and videos may be added. Patients will briefly review the concepts of the Pritikin Eating Plan and the importance of low-calorie dense foods. The concept of mindful eating will be introduced as well as the importance of paying attention to internal hunger signals. Triggers for non-hunger eating and techniques for dealing with triggers will be explored. The purpose of this lesson is to provide patients with the opportunity to review the basic principles of the Pritikin Eating Plan, discuss the value of eating mindfully and how to measure internal cues of hunger and fullness using the Hunger Scale. Patients will also discuss reasons for non-hunger eating and learn strategies to use for controlling emotional eating.  Targeting Your Nutrition Priorities Clinical staff led group instruction and group discussion with PowerPoint presentation and patient guidebook. To enhance the learning environment the use of posters, models and videos may be added. Patients will learn how to determine their genetic susceptibility to disease by reviewing their family history. Patients will gain insight into the  importance of diet as part of an overall healthy lifestyle in mitigating the impact of genetics and other environmental insults. The purpose of this lesson is to provide patients with the opportunity to assess their personal nutrition priorities by looking at their family history, their own health history and current risk factors. Patients will also be able to discuss ways of prioritizing and modifying the Pritikin Eating Plan for their highest risk areas  Menu  Clinical staff led group instruction and group discussion with PowerPoint presentation and patient guidebook. To enhance the learning environment the use of posters, models and videos may be added. Using menus brought in from [employer], or printed from [employer], patients will apply the Pritikin dining out guidelines that were presented in the [employer] video. Patients will also be able to practice these guidelines in a variety of provided scenarios. The purpose of this lesson is to provide patients with the opportunity to practice hands-on learning of the Pritikin Dining Out guidelines with actual menus and practice scenarios.  Label Reading Clinical staff led group instruction and group discussion with PowerPoint presentation and patient guidebook. To enhance the learning environment the use of posters, models and videos may be added. Patients will review and discuss the Pritikin label reading guidelines presented in  Pritikins Label Reading Educational series video. Using fool labels brought in from local grocery stores and markets, patients will apply the label reading guidelines and determine if the packaged food meet the Pritikin guidelines. The purpose of this lesson is to provide patients with the opportunity to review, discuss, and practice hands-on learning of the Pritikin Label Reading guidelines with actual packaged food labels. Cooking School  Pritikins [employer] are designed to teach  patients ways to prepare quick, simple, and affordable recipes at home. The importance of nutritions role in chronic disease risk reduction is reflected in its emphasis in the overall Pritikin program. By learning how to prepare essential core Pritikin Eating Plan recipes, patients will increase control over what they eat; be able to customize the flavor of foods without the use of added salt, sugar, or fat; and improve the quality of the food they consume. By learning a set of core recipes which are easily assembled, quickly prepared, and affordable, patients are more likely to prepare more healthy foods at home. These workshops focus on convenient breakfasts, simple entres, side dishes, and desserts which can be prepared with minimal effort and are consistent with nutrition recommendations for cardiovascular risk reduction. Cooking [employer] are taught by a armed forces logistics/support/administrative officer (RD) who has been trained by the [employer]. The chef or RD has a clear understanding of the importance of minimizing - if not completely eliminating - added fat, sugar, and sodium in recipes. Throughout the series of Cooking School Workshop sessions, patients will learn about healthy ingredients and efficient methods of cooking to build confidence in their capability to prepare    Cooking School weekly topics:  Adding Flavor- Sodium-Free  Fast and Healthy Breakfasts  Powerhouse Plant-Based Proteins  Satisfying Salads and Dressings  Simple Sides and Sauces  International Cuisine-Spotlight on the [employer] Zones  Delicious Desserts  Savory Soups  [employer] - Meals in a Astronomer Appetizers and Snacks  Comforting Weekend Breakfasts  One-Pot Wonders   Fast Evening Meals  Landscape Architect Your Pritikin Plate  WORKSHOPS   Healthy Mindset (Psychosocial):  Focused Goals, Sustainable Changes Clinical staff led group instruction and group discussion with PowerPoint  presentation and patient guidebook. To enhance the learning environment the use of posters, models and videos may be added. Patients will be able to apply effective goal setting strategies to establish at least one personal goal, and then take consistent, meaningful action toward that goal. They will learn to identify common barriers to achieving personal goals and develop strategies to overcome them. Patients will also gain an understanding of how our mind-set can impact our ability to achieve goals and the importance of cultivating a positive and growth-oriented mind-set. The purpose of this lesson is to provide patients with a deeper understanding of how to set and achieve personal goals, as well as the tools and strategies needed to overcome common obstacles which may arise along the way.  From Head to Heart: The Power of a Healthy Outlook  Clinical staff led group instruction and group discussion with PowerPoint presentation and patient guidebook. To enhance the learning environment the use of posters, models and videos may be added. Patients will be able to recognize and describe the impact of emotions and mood on physical health. They will discover the importance of self-care and explore self-care practices which may work for them. Patients will also learn how to utilize the 4 Cs to cultivate a healthier outlook and better  manage stress and challenges. The purpose of this lesson is to demonstrate to patients how a healthy outlook is an essential part of maintaining good health, especially as they continue their cardiac rehab journey.  Healthy Sleep for a Healthy Heart Clinical staff led group instruction and group discussion with PowerPoint presentation and patient guidebook. To enhance the learning environment the use of posters, models and videos may be added. At the conclusion of this workshop, patients will be able to demonstrate knowledge of the importance of sleep to overall health, well-being,  and quality of life. They will understand the symptoms of, and treatments for, common sleep disorders. Patients will also be able to identify daytime and nighttime behaviors which impact sleep, and they will be able to apply these tools to help manage sleep-related challenges. The purpose of this lesson is to provide patients with a general overview of sleep and outline the importance of quality sleep. Patients will learn about a few of the most common sleep disorders. Patients will also be introduced to the concept of sleep hygiene, and discover ways to self-manage certain sleeping problems through simple daily behavior changes. Finally, the workshop will motivate patients by clarifying the links between quality sleep and their goals of heart-healthy living.   Recognizing and Reducing Stress Clinical staff led group instruction and group discussion with PowerPoint presentation and patient guidebook. To enhance the learning environment the use of posters, models and videos may be added. At the conclusion of this workshop, patients will be able to understand the types of stress reactions, differentiate between acute and chronic stress, and recognize the impact that chronic stress has on their health. They will also be able to apply different coping mechanisms, such as reframing negative self-talk. Patients will have the opportunity to practice a variety of stress management techniques, such as deep abdominal breathing, progressive muscle relaxation, and/or guided imagery.  The purpose of this lesson is to educate patients on the role of stress in their lives and to provide healthy techniques for coping with it.  Learning Barriers/Preferences:  Learning Barriers/Preferences - 04/04/24 1244       Learning Barriers/Preferences   Learning Barriers Sight    Learning Preferences Written Material;Individual Instruction;Pictoral          Education Topics:  Knowledge Questionnaire Score:  Knowledge  Questionnaire Score - 04/04/24 1244       Knowledge Questionnaire Score   Pre Score 23/24          Core Components/Risk Factors/Patient Goals at Admission:  Personal Goals and Risk Factors at Admission - 04/04/24 1242       Core Components/Risk Factors/Patient Goals on Admission    Weight Management Weight Maintenance    Hypertension Yes    Intervention Provide education on lifestyle modifcations including regular physical activity/exercise, weight management, moderate sodium restriction and increased consumption of fresh fruit, vegetables, and low fat dairy, alcohol  moderation, and smoking cessation.;Monitor prescription use compliance.    Expected Outcomes Short Term: Continued assessment and intervention until BP is < 140/48mm HG in hypertensive participants. < 130/80mm HG in hypertensive participants with diabetes, heart failure or chronic kidney disease.;Long Term: Maintenance of blood pressure at goal levels.    Lipids Yes    Intervention Provide education and support for participant on nutrition & aerobic/resistive exercise along with prescribed medications to achieve LDL 70mg , HDL >40mg .    Expected Outcomes Short Term: Participant states understanding of desired cholesterol values and is compliant with medications prescribed. Participant is following exercise prescription  and nutrition guidelines.;Long Term: Cholesterol controlled with medications as prescribed, with individualized exercise RX and with personalized nutrition plan. Value goals: LDL < 70mg , HDL > 40 mg.          Core Components/Risk Factors/Patient Goals Review:    Core Components/Risk Factors/Patient Goals at Discharge (Final Review):    ITP Comments:  ITP Comments     Row Name 04/04/24 1015           ITP Comments ITP Comments Wilbert Holland, MD: Medical Director. Introduction to the Pritikin Education Program/Intensive Cardiac Rehab. Intial orientation packet reviewed with the patient.           Comments: Participant attended orientation for the cardiac rehabilitation program on  04/04/2024  to perform initial intake and exercise walk test. Patient introduced to the Pritikin Program education and orientation packet was reviewed. Completed 6-minute walk test, measurements, initial ITP, and exercise prescription. Vital signs stable. Telemetry-atrial fibrillation, asymptomatic.   Service time was from 10:12 to 12:06.        [1]  Current Outpatient Medications:    acetaminophen  (TYLENOL ) 500 MG tablet, Take 500 mg by mouth every 6 (six) hours as needed (pain/restless leg)., Disp: , Rfl:    albuterol  (VENTOLIN  HFA) 108 (90 Base) MCG/ACT inhaler, Inhale 2 puffs into the lungs every 4 (four) hours as needed for wheezing or shortness of breath., Disp: 18 g, Rfl: 1   ALPRAZolam  (XANAX ) 0.25 MG tablet, Take 0.125 mg by mouth at bedtime as needed for sleep., Disp: , Rfl:    apixaban  (ELIQUIS ) 5 MG TABS tablet, Take 1 tablet (5 mg total) by mouth 2 (two) times daily., Disp: 180 tablet, Rfl: 1   Ascorbic Acid (VITAMIN C PO), Take 1 tablet by mouth every morning., Disp: , Rfl:    Azelastine  HCl 137 MCG/SPRAY SOLN, USE 2 SPRAYS IN EACH       NOSTRIL TWICE DAILY, Disp: 30 mL, Rfl: 0   azithromycin  (ZITHROMAX ) 500 MG tablet, Take 1 tablet (500 mg total) by mouth as directed. Take one tablet 1 hour before any dental work including cleanings., Disp: 6 tablet, Rfl: 12   Benralizumab  (FASENRA  [place]), Inject into the skin every 8 (eight) weeks., Disp: , Rfl:    brimonidine  (ALPHAGAN ) 0.2 % ophthalmic solution, Place 1 drop into the right eye 2 (two) times daily., Disp: , Rfl:    Calcium  Carbonate-Vitamin D (CALCIUM -VITAMIN D) 600-125 MG-UNIT TABS, Take 1 tablet by mouth 2 (two) times daily. , Disp: , Rfl:    diltiazem  (CARDIZEM  CD) 180 MG 24 hr capsule, TAKE 1 CAPSULE EVERY       MORNING AND AT BEDTIME, Disp: 180 capsule, Rfl: 3   diphenhydrAMINE  (BENADRYL ) 25 MG tablet, Take 25 mg by mouth daily as needed for  allergies., Disp: , Rfl:    EPINEPHrine  0.3 mg/0.3 mL IJ SOAJ injection, INJECT 0.3MG                INTRAMUSCULARLY ONCE, Disp: 2 mL, Rfl: 1   famotidine  (PEPCID ) 20 MG tablet, Take 20 mg by mouth at bedtime., Disp: , Rfl:    Ferrous Sulfate  Dried (SLOW RELEASE IRON) 45 MG TBCR, Take 45 mg by mouth daily., Disp: , Rfl:    levocetirizine (XYZAL ) 5 MG tablet, TAKE 1 TABLET EVERY EVENING, Disp: 90 tablet, Rfl: 1   levothyroxine  (SYNTHROID ) 88 MCG tablet, Take 88 mcg by mouth daily before breakfast., Disp: , Rfl:    losartan  (COZAAR ) 25 MG tablet, Take 1 tablet (25 mg total)  by mouth as directed. Take 50mg  in the morning and 25mg  in the evening., Disp: , Rfl:    montelukast  (SINGULAIR ) 10 MG tablet, Take 1 tablet (10 mg total) by mouth at bedtime., Disp: 90 tablet, Rfl: 1   Multiple Vitamin (MULTIVITAMIN WITH MINERALS) TABS tablet, Take 1 tablet daily by mouth., Disp: , Rfl:    Multiple Vitamins-Minerals (OCUVITE PRESERVISION PO), Take 1 tablet 2 (two) times daily by mouth., Disp: , Rfl:    pantoprazole  (PROTONIX ) 40 MG tablet, Take 40 mg by mouth every morning., Disp: , Rfl:    Polyethyl Glycol-Propyl Glycol (SYSTANE OP), Place 1 drop into both eyes 3 (three) times daily as needed (dry/irritated eyes.)., Disp: , Rfl:    rosuvastatin  (CRESTOR ) 5 MG tablet, Take 1 tablet (5 mg total) by mouth daily., Disp: 90 tablet, Rfl: 2   SYMBICORT  160-4.5 MCG/ACT inhaler, USE 2 INHALATIONS ORALLY   TWICE DAILY, Disp: 30.6 g, Rfl: 1  Current Facility-Administered Medications:    Benralizumab  SOSY 30 mg, 30 mg, Subcutaneous, Q8 Weeks, Kozlow, Eric J, MD, 30 mg at 02/13/24 1107 [2]  Social History Tobacco Use  Smoking Status Former   Current packs/day: 0.00   Average packs/day: 0.5 packs/day for 4.0 years (2.0 ttl pk-yrs)   Types: Cigarettes   Start date: 02/28/1957   Quit date: 02/28/1961   Years since quitting: 63.1  Smokeless Tobacco Never  Tobacco Comments   Former smoker 06/20/22

## 2024-04-04 NOTE — Progress Notes (Signed)
 Cardiac Rehab Medication Review by a Nurse  Does the patient  feel that his/her medications are working for him/her?  yes  Has the patient been experiencing any side effects to the medications prescribed?  no  Does the patient measure his/her own blood pressure or blood glucose at home?  no   Does the patient have any problems obtaining medications due to transportation or finances?   no  Understanding of regimen: excellent Understanding of indications: excellent Potential of compliance: excellent    Nurse comments: Gene is taking his medications as prescribed and has a good understanding of what his medications are for. Gene has a BP cuff at home. Gene does not check his blood pressures on a regular basis.    Hadassah Gaw [facility] RN 04/04/2024 10:43 AM

## 2024-04-08 ENCOUNTER — Encounter (HOSPITAL_COMMUNITY)

## 2024-04-09 ENCOUNTER — Ambulatory Visit

## 2024-04-10 ENCOUNTER — Encounter (HOSPITAL_COMMUNITY)

## 2024-04-10 ENCOUNTER — Ambulatory Visit: Admitting: Student

## 2024-04-12 ENCOUNTER — Encounter (HOSPITAL_COMMUNITY)

## 2024-04-15 ENCOUNTER — Encounter (HOSPITAL_COMMUNITY)

## 2024-04-17 ENCOUNTER — Encounter (HOSPITAL_COMMUNITY)

## 2024-04-19 ENCOUNTER — Encounter (HOSPITAL_COMMUNITY)

## 2024-04-22 ENCOUNTER — Encounter (HOSPITAL_COMMUNITY)

## 2024-04-23 ENCOUNTER — Ambulatory Visit: Admitting: Family

## 2024-04-24 ENCOUNTER — Encounter (HOSPITAL_COMMUNITY)

## 2024-04-26 ENCOUNTER — Encounter (HOSPITAL_COMMUNITY)

## 2024-04-29 ENCOUNTER — Encounter (HOSPITAL_COMMUNITY)

## 2024-05-01 ENCOUNTER — Encounter (HOSPITAL_COMMUNITY)

## 2024-05-03 ENCOUNTER — Encounter (HOSPITAL_COMMUNITY)

## 2024-05-06 ENCOUNTER — Encounter (HOSPITAL_COMMUNITY)

## 2024-05-08 ENCOUNTER — Encounter (HOSPITAL_COMMUNITY)

## 2024-05-09 ENCOUNTER — Ambulatory Visit

## 2024-05-10 ENCOUNTER — Encounter (HOSPITAL_COMMUNITY)

## 2024-05-13 ENCOUNTER — Encounter (HOSPITAL_COMMUNITY)

## 2024-05-15 ENCOUNTER — Encounter (HOSPITAL_COMMUNITY)

## 2024-05-17 ENCOUNTER — Encounter (HOSPITAL_COMMUNITY)

## 2024-05-20 ENCOUNTER — Encounter (HOSPITAL_COMMUNITY)

## 2024-05-22 ENCOUNTER — Encounter (HOSPITAL_COMMUNITY)

## 2024-05-24 ENCOUNTER — Encounter (HOSPITAL_COMMUNITY)

## 2024-05-27 ENCOUNTER — Encounter (HOSPITAL_COMMUNITY)

## 2024-05-29 ENCOUNTER — Encounter (HOSPITAL_COMMUNITY)

## 2024-05-31 ENCOUNTER — Encounter (HOSPITAL_COMMUNITY)

## 2024-06-03 ENCOUNTER — Encounter (HOSPITAL_COMMUNITY)

## 2024-06-05 ENCOUNTER — Encounter (HOSPITAL_COMMUNITY)

## 2024-06-07 ENCOUNTER — Encounter (HOSPITAL_COMMUNITY)

## 2024-06-10 ENCOUNTER — Encounter (HOSPITAL_COMMUNITY)

## 2024-06-12 ENCOUNTER — Encounter (HOSPITAL_COMMUNITY)

## 2024-06-14 ENCOUNTER — Encounter (HOSPITAL_COMMUNITY)

## 2024-06-17 ENCOUNTER — Encounter (HOSPITAL_COMMUNITY)

## 2024-06-19 ENCOUNTER — Encounter (HOSPITAL_COMMUNITY)

## 2024-06-21 ENCOUNTER — Encounter (HOSPITAL_COMMUNITY)

## 2024-06-24 ENCOUNTER — Encounter (HOSPITAL_COMMUNITY)

## 2024-06-26 ENCOUNTER — Encounter (HOSPITAL_COMMUNITY)

## 2024-08-08 ENCOUNTER — Ambulatory Visit

## 2024-11-07 ENCOUNTER — Ambulatory Visit

## 2024-12-19 ENCOUNTER — Ambulatory Visit (HOSPITAL_COMMUNITY)

## 2024-12-19 ENCOUNTER — Ambulatory Visit: Admitting: Physician Assistant

## 2025-02-06 ENCOUNTER — Ambulatory Visit

## 2025-05-08 ENCOUNTER — Ambulatory Visit
